# Patient Record
Sex: Female | Born: 1950
Health system: Southern US, Community
[De-identification: ages and names within clinical notes are randomized; demographics above are authoritative.]

## PROBLEM LIST (undated history)

## (undated) DIAGNOSIS — R112 Nausea with vomiting, unspecified: Secondary | ICD-10-CM

## (undated) DIAGNOSIS — E785 Hyperlipidemia, unspecified: Secondary | ICD-10-CM

## (undated) DIAGNOSIS — M8580 Other specified disorders of bone density and structure, unspecified site: Secondary | ICD-10-CM

## (undated) DIAGNOSIS — R7303 Prediabetes: Secondary | ICD-10-CM

## (undated) DIAGNOSIS — G43909 Migraine, unspecified, not intractable, without status migrainosus: Secondary | ICD-10-CM

## (undated) DIAGNOSIS — K760 Fatty (change of) liver, not elsewhere classified: Secondary | ICD-10-CM

## (undated) DIAGNOSIS — G8929 Other chronic pain: Secondary | ICD-10-CM

## (undated) DIAGNOSIS — N200 Calculus of kidney: Secondary | ICD-10-CM

## (undated) DIAGNOSIS — M545 Low back pain, unspecified: Secondary | ICD-10-CM

## (undated) DIAGNOSIS — T7840XA Allergy, unspecified, initial encounter: Secondary | ICD-10-CM

## (undated) DIAGNOSIS — Z9889 Other specified postprocedural states: Secondary | ICD-10-CM

## (undated) DIAGNOSIS — R42 Dizziness and giddiness: Secondary | ICD-10-CM

## (undated) DIAGNOSIS — E039 Hypothyroidism, unspecified: Secondary | ICD-10-CM

## (undated) DIAGNOSIS — M1990 Unspecified osteoarthritis, unspecified site: Secondary | ICD-10-CM

## (undated) DIAGNOSIS — B001 Herpesviral vesicular dermatitis: Secondary | ICD-10-CM

## (undated) DIAGNOSIS — J302 Other seasonal allergic rhinitis: Secondary | ICD-10-CM

## (undated) DIAGNOSIS — K219 Gastro-esophageal reflux disease without esophagitis: Secondary | ICD-10-CM

## (undated) DIAGNOSIS — J189 Pneumonia, unspecified organism: Secondary | ICD-10-CM

## (undated) HISTORY — PX: KNEE ARTHROSCOPY: SHX127

## (undated) HISTORY — DX: Calculus of kidney: N20.0

## (undated) HISTORY — PX: PATELLA HARDWARE REMOVAL: SHX2178

## (undated) HISTORY — PX: WRIST GANGLION EXCISION: SUR520

## (undated) HISTORY — PX: BACK SURGERY: SHX140

## (undated) HISTORY — PX: COLONOSCOPY: SHX174

## (undated) HISTORY — PX: SHOULDER SURGERY: SHX246

## (undated) HISTORY — PX: SHOULDER OPEN ROTATOR CUFF REPAIR: SHX2407

## (undated) HISTORY — PX: FIXATION KYPHOPLASTY LUMBAR SPINE: SHX1642

## (undated) HISTORY — PX: CATARACT EXTRACTION W/ INTRAOCULAR LENS  IMPLANT, BILATERAL: SHX1307

## (undated) HISTORY — PX: JOINT REPLACEMENT: SHX530

## (undated) HISTORY — PX: PATELLA FRACTURE SURGERY: SHX735

---

## 2001-06-03 ENCOUNTER — Encounter: Payer: Self-pay | Admitting: Orthopedic Surgery

## 2001-06-03 ENCOUNTER — Ambulatory Visit (HOSPITAL_COMMUNITY): Admission: RE | Admit: 2001-06-03 | Discharge: 2001-06-03 | Payer: Self-pay | Admitting: Orthopedic Surgery

## 2001-09-23 ENCOUNTER — Encounter: Payer: Self-pay | Admitting: Orthopedic Surgery

## 2001-09-23 ENCOUNTER — Ambulatory Visit (HOSPITAL_COMMUNITY): Admission: RE | Admit: 2001-09-23 | Discharge: 2001-09-23 | Payer: Self-pay | Admitting: Orthopedic Surgery

## 2002-03-03 ENCOUNTER — Ambulatory Visit (HOSPITAL_BASED_OUTPATIENT_CLINIC_OR_DEPARTMENT_OTHER): Admission: RE | Admit: 2002-03-03 | Discharge: 2002-03-03 | Payer: Self-pay | Admitting: Orthopedic Surgery

## 2003-09-19 ENCOUNTER — Other Ambulatory Visit: Admission: RE | Admit: 2003-09-19 | Discharge: 2003-09-19 | Payer: Self-pay | Admitting: Obstetrics and Gynecology

## 2009-07-29 HISTORY — PX: HIP ARTHROPLASTY: SHX981

## 2013-05-13 DIAGNOSIS — M5136 Other intervertebral disc degeneration, lumbar region: Secondary | ICD-10-CM | POA: Insufficient documentation

## 2013-05-13 DIAGNOSIS — M1990 Unspecified osteoarthritis, unspecified site: Secondary | ICD-10-CM | POA: Insufficient documentation

## 2014-01-20 ENCOUNTER — Other Ambulatory Visit (HOSPITAL_COMMUNITY): Payer: Self-pay | Admitting: Orthopaedic Surgery

## 2014-01-28 ENCOUNTER — Encounter (HOSPITAL_COMMUNITY): Payer: Self-pay

## 2014-02-02 ENCOUNTER — Encounter (HOSPITAL_COMMUNITY): Payer: Self-pay

## 2014-02-02 ENCOUNTER — Encounter (HOSPITAL_COMMUNITY)
Admission: RE | Admit: 2014-02-02 | Discharge: 2014-02-02 | Disposition: A | Discharge status: Home / Self Care | Payer: Federal, State, Local not specified - PPO | Source: Ambulatory Visit | Attending: Orthopaedic Surgery | Admitting: Orthopaedic Surgery

## 2014-02-02 ENCOUNTER — Encounter (HOSPITAL_COMMUNITY): Admission: RE | Admit: 2014-02-02 | Payer: Federal, State, Local not specified - PPO | Source: Ambulatory Visit

## 2014-02-02 DIAGNOSIS — Z01818 Encounter for other preprocedural examination: Secondary | ICD-10-CM | POA: Diagnosis not present

## 2014-02-02 DIAGNOSIS — Z01812 Encounter for preprocedural laboratory examination: Secondary | ICD-10-CM | POA: Insufficient documentation

## 2014-02-02 DIAGNOSIS — Z01810 Encounter for preprocedural cardiovascular examination: Secondary | ICD-10-CM | POA: Insufficient documentation

## 2014-02-02 HISTORY — DX: Other seasonal allergic rhinitis: J30.2

## 2014-02-02 HISTORY — DX: Nausea with vomiting, unspecified: R11.2

## 2014-02-02 HISTORY — DX: Herpesviral vesicular dermatitis: B00.1

## 2014-02-02 HISTORY — DX: Other specified postprocedural states: Z98.890

## 2014-02-02 HISTORY — DX: Pneumonia, unspecified organism: J18.9

## 2014-02-02 HISTORY — DX: Gastro-esophageal reflux disease without esophagitis: K21.9

## 2014-02-02 HISTORY — DX: Hyperlipidemia, unspecified: E78.5

## 2014-02-02 HISTORY — DX: Hypothyroidism, unspecified: E03.9

## 2014-02-02 HISTORY — DX: Unspecified osteoarthritis, unspecified site: M19.90

## 2014-02-02 LAB — COMPREHENSIVE METABOLIC PANEL
ALT: 93 U/L — ABNORMAL HIGH (ref 0–35)
AST: 95 U/L — ABNORMAL HIGH (ref 0–37)
Albumin: 3.9 g/dL (ref 3.5–5.2)
Alkaline Phosphatase: 117 U/L (ref 39–117)
Anion gap: 14 (ref 5–15)
BUN: 11 mg/dL (ref 6–23)
CHLORIDE: 102 meq/L (ref 96–112)
CO2: 24 meq/L (ref 19–32)
Calcium: 10 mg/dL (ref 8.4–10.5)
Creatinine, Ser: 0.71 mg/dL (ref 0.50–1.10)
GFR, EST NON AFRICAN AMERICAN: 89 mL/min — AB (ref >90)
GLUCOSE: 86 mg/dL (ref 70–99)
Potassium: 4.6 mEq/L (ref 3.7–5.3)
Sodium: 140 mEq/L (ref 137–147)
Total Bilirubin: 0.3 mg/dL (ref 0.3–1.2)
Total Protein: 7.4 g/dL (ref 6.0–8.3)

## 2014-02-02 LAB — URINE MICROSCOPIC-ADD ON

## 2014-02-02 LAB — CBC
HEMATOCRIT: 42.7 % (ref 36.0–46.0)
HEMOGLOBIN: 14.3 g/dL (ref 12.0–15.0)
MCH: 32.6 pg (ref 26.0–34.0)
MCHC: 33.5 g/dL (ref 30.0–36.0)
MCV: 97.5 fL (ref 78.0–100.0)
Platelets: 224 10*3/uL (ref 150–400)
RBC: 4.38 MIL/uL (ref 3.87–5.11)
RDW: 13.6 % (ref 11.5–15.5)
WBC: 7.1 10*3/uL (ref 4.0–10.5)

## 2014-02-02 LAB — URINALYSIS, ROUTINE W REFLEX MICROSCOPIC
BILIRUBIN URINE: NEGATIVE
Glucose, UA: NEGATIVE mg/dL
HGB URINE DIPSTICK: NEGATIVE
KETONES UR: NEGATIVE mg/dL
Nitrite: NEGATIVE
Protein, ur: NEGATIVE mg/dL
Specific Gravity, Urine: 1.016 (ref 1.005–1.030)
UROBILINOGEN UA: 0.2 mg/dL (ref 0.0–1.0)
pH: 5.5 (ref 5.0–8.0)

## 2014-02-02 LAB — PROTIME-INR
INR: 0.97 (ref 0.00–1.49)
PROTHROMBIN TIME: 12.9 s (ref 11.6–15.2)

## 2014-02-02 LAB — SURGICAL PCR SCREEN
MRSA, PCR: NEGATIVE
Staphylococcus aureus: POSITIVE — AB

## 2014-02-02 NOTE — Pre-Procedure Instructions (Signed)
Emma Lewis  02/02/2014   Your procedure is scheduled on:  Wednesday February 09, 2014 at 12:00 PM.  Report to [facility] Admitting at 10:00 AM.  Call this number if you have problems the morning of surgery: [phone]   Remember:   Do not eat food or drink liquids after midnight.   Take these medicines the morning of surgery with A SIP OF WATER: Acyclovir (Zovirax), Hydromorphone (Dilaudid) if needed, Levothyroxine (Synthroid), Loratadine (Claritin) if needed, and Omeprazole (Prilosec)   Do not wear jewelry, make-up or nail polish.  Do not wear lotions, powders, or perfumes.   Do not shave 48 hours prior to surgery.  Do not bring valuables to the hospital.  [facility] is not responsible for any belongings or valuables.               Contacts, dentures or bridgework may not be worn into surgery.  Leave suitcase in the car. After surgery it may be brought to your room.  For patients admitted to the hospital, discharge time is determined by your treatment team.               Patients discharged the day of surgery will not be allowed to drive home.  Name and phone number of your driver: Family/Friend  Special Instructions: Shower using CHG soap the night before and the morning of your surgery   Please read over the following fact sheets that you were given: Pain Booklet, Coughing and Deep Breathing, MRSA Information and Surgical Site Infection Prevention

## 2014-02-02 NOTE — Pre-Procedure Instructions (Signed)
SYRA SIRMONS  02/02/2014   Your procedure is scheduled on:  Wednesday February 09, 2014 at 12:00 PM.  Report to [facility] Admitting at 10:00 AM.  Call this number if you have problems the morning of surgery: [phone]   Remember:   Do not eat food or drink liquids after midnight.   Take these medicines the morning of surgery with A SIP OF WATER: Acyclovir (Zovirax), Hydromorphone (Dilaudid) if needed, Levothyroxine (Synthroid), Loratadine (Claritin) if needed, and Omeprazole (Prilosec)   Do not wear jewelry, make-up or nail polish.  Do not wear lotions, powders, or perfumes.   Do not shave 48 hours prior to surgery.  Do not bring valuables to the hospital.  [facility] is not responsible for any belongings or valuables.               Contacts, dentures or bridgework may not be worn into surgery.  Leave suitcase in the car. After surgery it may be brought to your room.  For patients admitted to the hospital, discharge time is determined by your treatment team.               Patients discharged the day of surgery will not be allowed to drive home.  Name and phone number of your driver: Family/Friend  Special Instructions: Shower using CHG soap the night before and the morning of your surgery   Please read over the following fact sheets that you were given: Pain Booklet, Coughing and Deep Breathing, MRSA Information and Surgical Site Infection Prevention

## 2014-02-02 NOTE — Progress Notes (Signed)
Nurse called Bactroban ointment in to Pharmacy in [facility]. Nurse then called patient. No answer. Voicemail left requesting that patient pick ointment up at earliest convenience. Direct call back number left.

## 2014-02-03 NOTE — Progress Notes (Addendum)
Anesthesia chart review:  Patient is a 63 year old female scheduled for C4-5, C5-6 ACDF on 02/09/14 by Dr. Lorin Mercy. History includes non-smoker, post-operative N/V, HLD, GERD, hypothyroidism, arthritis, left THA.  BMI is consistent with obesity. PCP is listed as Dr. Willey Blade.   Meds: Calcium with D, Evening Primrose Oil, Dilaudid, Foltanx, Magnesium, Fish Oil, Ester-C, Zovirax, ibuprofen, levothyroxine, loratadine, omeprazole, simvastatin.  EKG on 02/02/14 showed: NSR.  CXR on 02/02/14 showed: No acute disease.  Abdominal ultrasound on 08/16/13 ([facility]) showed:  1. Echogenic liver suggesting hepatic steatosis. 2. Prior granulomatous disease. 3. No gallstones or wall thickening visualized.  Preoperative labs noted.  AST/ALT 95/93. PLT count and PT/INR WNL. She reported rare ETOH use.  She is on statin therapy, acyclovir (HSV prophylaxis), and pain medication which could all be contributing to her elevated LFTs.  Hepatic steatosis on recent ultrasound could also be playing a part.  Currently, I have no comparison labs available.  I did call and speak with patient.  She says she has been told in the past that her LFTs have been a little elevated. She is taking ibuprofen QID with plans to hold starting tomorrow. She thinks Dr. Karlton Lemon should have comparison labs. Records requested.  If old labs received, and unchanged then we may not have to repeat labs, but for now, will plan to get a HFP and PTT on the day of surgery. If AST/ALT results are improved or stable then that would make an acute process less likely and would plan to proceed. If significantly further elevated that she may need additional evaluation preoperatively.  I discussed with anesthesiologist Dr. Tobias Alexander who agrees with this plan.  I also updated Malachy Mood at Dr. Lorin Mercy office.  She will have him review.  [facility] Short Stay Center/Anesthesiology Phone [phone] 02/03/2014 4:25 PM  Addendum: 02/04/2014 12:07  PM Reviewed records from Dr. Willey Blade from 10/13/13.  ALT was 66, AST 59 then.  Hepatitis Panel (A, B, C) was negative.  Of note, Dr. Roland Earl office will now be [facility] at [phone].  Patient will continue care with Dr. Karlton Lemon at her new location.

## 2014-02-08 MED ORDER — CEFAZOLIN SODIUM-DEXTROSE 2-3 GM-% IV SOLR
2.0000 g | INTRAVENOUS | Status: AC
  Administered 2014-02-09: 2 g via INTRAVENOUS
  Filled 2014-02-08: qty 50

## 2014-02-09 ENCOUNTER — Observation Stay (HOSPITAL_COMMUNITY): Payer: Medicare Other

## 2014-02-09 ENCOUNTER — Encounter (HOSPITAL_COMMUNITY)
Admission: RE | Disposition: A | Discharge status: Home / Self Care | Payer: Self-pay | Source: Ambulatory Visit | Attending: Orthopaedic Surgery

## 2014-02-09 ENCOUNTER — Observation Stay (HOSPITAL_COMMUNITY)
Admission: RE | Admit: 2014-02-09 | Discharge: 2014-02-10 | Disposition: A | Discharge status: Home / Self Care | Payer: Medicare Other | Source: Ambulatory Visit | Attending: Orthopaedic Surgery | Admitting: Orthopaedic Surgery

## 2014-02-09 ENCOUNTER — Inpatient Hospital Stay (HOSPITAL_COMMUNITY): Payer: Medicare Other | Admitting: Anesthesiology

## 2014-02-09 ENCOUNTER — Encounter (HOSPITAL_COMMUNITY): Payer: Self-pay | Admitting: *Deleted

## 2014-02-09 ENCOUNTER — Encounter (HOSPITAL_COMMUNITY): Payer: Medicare Other | Admitting: Vascular Surgery

## 2014-02-09 DIAGNOSIS — Z22321 Carrier or suspected carrier of Methicillin susceptible Staphylococcus aureus: Secondary | ICD-10-CM | POA: Diagnosis not present

## 2014-02-09 DIAGNOSIS — M5382 Other specified dorsopathies, cervical region: Secondary | ICD-10-CM | POA: Diagnosis not present

## 2014-02-09 DIAGNOSIS — K219 Gastro-esophageal reflux disease without esophagitis: Secondary | ICD-10-CM | POA: Insufficient documentation

## 2014-02-09 DIAGNOSIS — M47812 Spondylosis without myelopathy or radiculopathy, cervical region: Principal | ICD-10-CM | POA: Diagnosis present

## 2014-02-09 DIAGNOSIS — E039 Hypothyroidism, unspecified: Secondary | ICD-10-CM | POA: Diagnosis not present

## 2014-02-09 DIAGNOSIS — Z86718 Personal history of other venous thrombosis and embolism: Secondary | ICD-10-CM | POA: Diagnosis not present

## 2014-02-09 DIAGNOSIS — Z96649 Presence of unspecified artificial hip joint: Secondary | ICD-10-CM | POA: Insufficient documentation

## 2014-02-09 DIAGNOSIS — M1990 Unspecified osteoarthritis, unspecified site: Secondary | ICD-10-CM | POA: Insufficient documentation

## 2014-02-09 DIAGNOSIS — I2510 Atherosclerotic heart disease of native coronary artery without angina pectoris: Secondary | ICD-10-CM | POA: Diagnosis not present

## 2014-02-09 DIAGNOSIS — M5020 Other cervical disc displacement, unspecified cervical region: Secondary | ICD-10-CM | POA: Insufficient documentation

## 2014-02-09 HISTORY — PX: ANTERIOR CERVICAL DECOMP/DISCECTOMY FUSION: SHX1161

## 2014-02-09 LAB — APTT: aPTT: 23 seconds — ABNORMAL LOW (ref 24–37)

## 2014-02-09 LAB — HEPATIC FUNCTION PANEL
ALT: 97 U/L — ABNORMAL HIGH (ref 0–35)
AST: 97 U/L — ABNORMAL HIGH (ref 0–37)
Albumin: 3.8 g/dL (ref 3.5–5.2)
Alkaline Phosphatase: 122 U/L — ABNORMAL HIGH (ref 39–117)
Bilirubin, Direct: <0.2 mg/dL (ref 0.0–0.3)
TOTAL PROTEIN: 7.1 g/dL (ref 6.0–8.3)
Total Bilirubin: 0.4 mg/dL (ref 0.3–1.2)

## 2014-02-09 SURGERY — ANTERIOR CERVICAL DECOMPRESSION/DISCECTOMY FUSION 2 LEVELS
Anesthesia: General | Site: Neck

## 2014-02-09 MED ORDER — NEOSTIGMINE METHYLSULFATE 10 MG/10ML IV SOLN
INTRAVENOUS | Status: DC | PRN
  Administered 2014-02-09: 3 mg via INTRAVENOUS

## 2014-02-09 MED ORDER — ACETAMINOPHEN 160 MG/5ML PO SOLN
325.0000 mg | ORAL | Status: DC | PRN
  Filled 2014-02-09: qty 20.3

## 2014-02-09 MED ORDER — FENTANYL CITRATE 0.05 MG/ML IJ SOLN
INTRAMUSCULAR | Status: AC
  Filled 2014-02-09: qty 5

## 2014-02-09 MED ORDER — HYDROMORPHONE HCL PF 1 MG/ML IJ SOLN
INTRAMUSCULAR | Status: AC
  Filled 2014-02-09: qty 1

## 2014-02-09 MED ORDER — MORPHINE SULFATE 2 MG/ML IJ SOLN: 1.0000 mg | INTRAMUSCULAR | Status: DC | PRN

## 2014-02-09 MED ORDER — METHOCARBAMOL 500 MG PO TABS
500.0000 mg | ORAL_TABLET | Freq: Four times a day (QID) | ORAL | Status: DC | PRN
  Administered 2014-02-09: 500 mg via ORAL

## 2014-02-09 MED ORDER — OXYCODONE-ACETAMINOPHEN 5-325 MG PO TABS
1.0000 | ORAL_TABLET | ORAL | Status: DC | PRN
  Administered 2014-02-09 – 2014-02-10 (×2): 2 via ORAL
  Filled 2014-02-09 (×2): qty 2

## 2014-02-09 MED ORDER — PHENYLEPHRINE HCL 10 MG/ML IJ SOLN
INTRAMUSCULAR | Status: DC | PRN
  Administered 2014-02-09 (×3): 40 ug via INTRAVENOUS

## 2014-02-09 MED ORDER — OXYCODONE HCL 5 MG/5ML PO SOLN
5.0000 mg | Freq: Once | ORAL | Status: DC | PRN
Start: 2014-02-09 — End: 2014-02-09

## 2014-02-09 MED ORDER — ACETAMINOPHEN 325 MG PO TABS: 650.0000 mg | ORAL_TABLET | ORAL | Status: DC | PRN

## 2014-02-09 MED ORDER — POLYVINYL ALCOHOL 1.4 % OP SOLN
1.0000 [drp] | Freq: Every day | OPHTHALMIC | Status: DC | PRN
  Filled 2014-02-09: qty 15

## 2014-02-09 MED ORDER — GLYCOPYRROLATE 0.2 MG/ML IJ SOLN
INTRAMUSCULAR | Status: DC | PRN
  Administered 2014-02-09: 0.4 mg via INTRAVENOUS

## 2014-02-09 MED ORDER — PHENYLEPHRINE 40 MCG/ML (10ML) SYRINGE FOR IV PUSH (FOR BLOOD PRESSURE SUPPORT)
PREFILLED_SYRINGE | INTRAVENOUS | Status: AC
  Filled 2014-02-09: qty 10

## 2014-02-09 MED ORDER — ARTIFICIAL TEARS OP OINT
TOPICAL_OINTMENT | OPHTHALMIC | Status: DC | PRN
  Administered 2014-02-09: 1 via OPHTHALMIC

## 2014-02-09 MED ORDER — BUPIVACAINE-EPINEPHRINE (PF) 0.25% -1:200000 IJ SOLN
INTRAMUSCULAR | Status: AC
  Filled 2014-02-09: qty 30

## 2014-02-09 MED ORDER — LEVOTHYROXINE SODIUM 100 MCG PO TABS
100.0000 ug | ORAL_TABLET | Freq: Every day | ORAL | Status: DC
  Administered 2014-02-10: 100 ug via ORAL
  Filled 2014-02-09 (×2): qty 1

## 2014-02-09 MED ORDER — ACYCLOVIR 400 MG PO TABS
400.0000 mg | ORAL_TABLET | Freq: Every day | ORAL | Status: DC
  Filled 2014-02-09: qty 1

## 2014-02-09 MED ORDER — VECURONIUM BROMIDE 10 MG IV SOLR
INTRAVENOUS | Status: DC | PRN
  Administered 2014-02-09: 9 mg via INTRAVENOUS
  Administered 2014-02-09: 1 mg via INTRAVENOUS

## 2014-02-09 MED ORDER — FENTANYL CITRATE 0.05 MG/ML IJ SOLN
INTRAMUSCULAR | Status: DC | PRN
  Administered 2014-02-09 (×3): 50 ug via INTRAVENOUS
  Administered 2014-02-09: 100 ug via INTRAVENOUS
  Administered 2014-02-09: 50 ug via INTRAVENOUS

## 2014-02-09 MED ORDER — OXYCODONE HCL 5 MG PO TABS: 5.0000 mg | ORAL_TABLET | Freq: Once | ORAL | Status: DC | PRN

## 2014-02-09 MED ORDER — SOOTHE XP OP SOLN: 1.0000 [drp] | Freq: Every day | OPHTHALMIC | Status: DC | PRN

## 2014-02-09 MED ORDER — SODIUM CHLORIDE 0.9 % IJ SOLN: 3.0000 mL | INTRAMUSCULAR | Status: DC | PRN

## 2014-02-09 MED ORDER — FLEET ENEMA 7-19 GM/118ML RE ENEM
1.0000 | ENEMA | Freq: Once | RECTAL | Status: AC | PRN
  Filled 2014-02-09: qty 1

## 2014-02-09 MED ORDER — LACTATED RINGERS IV SOLN
INTRAVENOUS | Status: DC | PRN
Start: 2014-02-09 — End: 2014-02-09
  Administered 2014-02-09 (×2): via INTRAVENOUS

## 2014-02-09 MED ORDER — METHOCARBAMOL 500 MG PO TABS: 500.0000 mg | ORAL_TABLET | Freq: Four times a day (QID) | ORAL | Status: DC | PRN

## 2014-02-09 MED ORDER — KETOROLAC TROMETHAMINE 30 MG/ML IJ SOLN: 15.0000 mg | Freq: Once | INTRAMUSCULAR | Status: DC | PRN

## 2014-02-09 MED ORDER — SODIUM CHLORIDE 0.9 % IJ SOLN
3.0000 mL | Freq: Two times a day (BID) | INTRAMUSCULAR | Status: DC
  Administered 2014-02-09: 3 mL via INTRAVENOUS

## 2014-02-09 MED ORDER — ONDANSETRON HCL 4 MG/2ML IJ SOLN: 4.0000 mg | INTRAMUSCULAR | Status: DC | PRN

## 2014-02-09 MED ORDER — VECURONIUM BROMIDE 10 MG IV SOLR
INTRAVENOUS | Status: AC
  Filled 2014-02-09: qty 10

## 2014-02-09 MED ORDER — LIDOCAINE HCL (CARDIAC) 20 MG/ML IV SOLN
INTRAVENOUS | Status: DC | PRN
  Administered 2014-02-09: 70 mg via INTRAVENOUS

## 2014-02-09 MED ORDER — DOCUSATE SODIUM 100 MG PO CAPS
100.0000 mg | ORAL_CAPSULE | Freq: Two times a day (BID) | ORAL | Status: DC
  Administered 2014-02-09 – 2014-02-10 (×2): 100 mg via ORAL
  Filled 2014-02-09 (×3): qty 1

## 2014-02-09 MED ORDER — ZOLPIDEM TARTRATE 5 MG PO TABS: 5.0000 mg | ORAL_TABLET | Freq: Every evening | ORAL | Status: DC | PRN

## 2014-02-09 MED ORDER — HYDROCODONE-ACETAMINOPHEN 5-325 MG PO TABS: 1.0000 | ORAL_TABLET | ORAL | Status: DC | PRN

## 2014-02-09 MED ORDER — OXYCODONE-ACETAMINOPHEN 5-325 MG PO TABS: 1.0000 | ORAL_TABLET | ORAL | Status: DC | PRN

## 2014-02-09 MED ORDER — LORATADINE 10 MG PO TABS
10.0000 mg | ORAL_TABLET | Freq: Every day | ORAL | Status: DC | PRN
  Filled 2014-02-09: qty 1

## 2014-02-09 MED ORDER — BUPIVACAINE-EPINEPHRINE 0.25% -1:200000 IJ SOLN
INTRAMUSCULAR | Status: DC | PRN
  Administered 2014-02-09: 7 mL

## 2014-02-09 MED ORDER — DEXTROSE 5 % IV SOLN
500.0000 mg | Freq: Four times a day (QID) | INTRAVENOUS | Status: DC | PRN
  Filled 2014-02-09: qty 5

## 2014-02-09 MED ORDER — KETOROLAC TROMETHAMINE 30 MG/ML IJ SOLN
30.0000 mg | Freq: Four times a day (QID) | INTRAMUSCULAR | Status: DC
  Administered 2014-02-09 – 2014-02-10 (×3): 30 mg via INTRAVENOUS
  Filled 2014-02-09 (×3): qty 1

## 2014-02-09 MED ORDER — ACETAMINOPHEN 325 MG PO TABS: 325.0000 mg | ORAL_TABLET | ORAL | Status: DC | PRN

## 2014-02-09 MED ORDER — ONDANSETRON HCL 4 MG/2ML IJ SOLN: 4.0000 mg | Freq: Once | INTRAMUSCULAR | Status: DC | PRN

## 2014-02-09 MED ORDER — SCOPOLAMINE 1 MG/3DAYS TD PT72
1.0000 | MEDICATED_PATCH | TRANSDERMAL | Status: DC
  Administered 2014-02-09: 1.5 mg via TRANSDERMAL

## 2014-02-09 MED ORDER — PANTOPRAZOLE SODIUM 40 MG PO TBEC
40.0000 mg | DELAYED_RELEASE_TABLET | Freq: Every day | ORAL | Status: DC
  Administered 2014-02-10: 40 mg via ORAL
  Filled 2014-02-09: qty 1

## 2014-02-09 MED ORDER — MIDAZOLAM HCL 2 MG/2ML IJ SOLN
INTRAMUSCULAR | Status: AC
  Filled 2014-02-09: qty 2

## 2014-02-09 MED ORDER — METHOCARBAMOL 500 MG PO TABS
ORAL_TABLET | ORAL | Status: AC
  Filled 2014-02-09: qty 1

## 2014-02-09 MED ORDER — ACETAMINOPHEN 650 MG RE SUPP
650.0000 mg | RECTAL | Status: DC | PRN
Start: 2014-02-09 — End: 2014-02-10

## 2014-02-09 MED ORDER — ONDANSETRON HCL 4 MG/2ML IJ SOLN
INTRAMUSCULAR | Status: DC | PRN
  Administered 2014-02-09: 4 mg via INTRAVENOUS

## 2014-02-09 MED ORDER — STERILE WATER FOR INJECTION IJ SOLN
INTRAMUSCULAR | Status: AC
  Filled 2014-02-09: qty 10

## 2014-02-09 MED ORDER — DEXAMETHASONE SODIUM PHOSPHATE 4 MG/ML IJ SOLN
INTRAMUSCULAR | Status: DC | PRN
  Administered 2014-02-09: 4 mg via INTRAVENOUS

## 2014-02-09 MED ORDER — GLYCOPYRROLATE 0.2 MG/ML IJ SOLN
INTRAMUSCULAR | Status: AC
  Filled 2014-02-09: qty 2

## 2014-02-09 MED ORDER — SCOPOLAMINE 1 MG/3DAYS TD PT72
MEDICATED_PATCH | TRANSDERMAL | Status: AC
  Filled 2014-02-09: qty 1

## 2014-02-09 MED ORDER — KCL IN DEXTROSE-NACL 20-5-0.45 MEQ/L-%-% IV SOLN
INTRAVENOUS | Status: DC
  Filled 2014-02-09 (×3): qty 1000

## 2014-02-09 MED ORDER — SIMVASTATIN 40 MG PO TABS
40.0000 mg | ORAL_TABLET | Freq: Every day | ORAL | Status: DC
  Administered 2014-02-09: 40 mg via ORAL
  Filled 2014-02-09 (×2): qty 1

## 2014-02-09 MED ORDER — BISACODYL 10 MG RE SUPP: 10.0000 mg | Freq: Every day | RECTAL | Status: DC | PRN

## 2014-02-09 MED ORDER — MIDAZOLAM HCL 5 MG/5ML IJ SOLN
INTRAMUSCULAR | Status: DC | PRN
  Administered 2014-02-09: 2 mg via INTRAVENOUS

## 2014-02-09 MED ORDER — PROPOFOL 10 MG/ML IV BOLUS
INTRAVENOUS | Status: DC | PRN
  Administered 2014-02-09: 140 mg via INTRAVENOUS
  Administered 2014-02-09: 60 mg via INTRAVENOUS

## 2014-02-09 MED ORDER — KETOROLAC TROMETHAMINE 30 MG/ML IJ SOLN
INTRAMUSCULAR | Status: AC
  Filled 2014-02-09: qty 1

## 2014-02-09 MED ORDER — PHENOL 1.4 % MT LIQD: 1.0000 | OROMUCOSAL | Status: DC | PRN

## 2014-02-09 MED ORDER — SENNOSIDES-DOCUSATE SODIUM 8.6-50 MG PO TABS
1.0000 | ORAL_TABLET | Freq: Every evening | ORAL | Status: DC | PRN
  Filled 2014-02-09: qty 1

## 2014-02-09 MED ORDER — DEXAMETHASONE SODIUM PHOSPHATE 4 MG/ML IJ SOLN
INTRAMUSCULAR | Status: AC
Start: 2014-02-09 — End: 2014-02-09
  Filled 2014-02-09: qty 1

## 2014-02-09 MED ORDER — NEOSTIGMINE METHYLSULFATE 10 MG/10ML IV SOLN
INTRAVENOUS | Status: AC
  Filled 2014-02-09: qty 1

## 2014-02-09 MED ORDER — LACTATED RINGERS IV SOLN
INTRAVENOUS | Status: DC
  Administered 2014-02-09: 11:00:00 via INTRAVENOUS

## 2014-02-09 MED ORDER — MENTHOL 3 MG MT LOZG
1.0000 | LOZENGE | OROMUCOSAL | Status: DC | PRN
  Filled 2014-02-09: qty 9

## 2014-02-09 MED ORDER — HYDROMORPHONE HCL PF 1 MG/ML IJ SOLN: 0.2500 mg | INTRAMUSCULAR | Status: DC | PRN

## 2014-02-09 MED ORDER — CEFAZOLIN SODIUM 1-5 GM-% IV SOLN
1.0000 g | Freq: Three times a day (TID) | INTRAVENOUS | Status: AC
  Administered 2014-02-09 – 2014-02-10 (×2): 1 g via INTRAVENOUS
  Filled 2014-02-09 (×2): qty 50

## 2014-02-09 SURGICAL SUPPLY — 64 items
ADH SKN CLS APL DERMABOND .7 (GAUZE/BANDAGES/DRESSINGS) ×1
APL SKNCLS STERI-STRIP NONHPOA (GAUZE/BANDAGES/DRESSINGS) ×1
BENZOIN TINCTURE PRP APPL 2/3 (GAUZE/BANDAGES/DRESSINGS) ×2 IMPLANT
BIT DRILL SKYLINE 12MM (BIT) IMPLANT
BLADE SURG ROTATE 9660 (MISCELLANEOUS) IMPLANT
BONE CERV LORDOTIC 14.5X12X6 (Bone Implant) ×6 IMPLANT
BUR ROUND FLUTED 4 SOFT TCH (BURR) ×1 IMPLANT
BUR ROUND FLUTED 4MM SOFT TCH (BURR) ×1
CLOSURE STERI-STRIP 1/2X4 (GAUZE/BANDAGES/DRESSINGS) ×1
CLSR STERI-STRIP ANTIMIC 1/2X4 (GAUZE/BANDAGES/DRESSINGS) ×1 IMPLANT
COLLAR CERV LO CONTOUR FIRM DE (SOFTGOODS) ×3 IMPLANT
CORDS BIPOLAR (ELECTRODE) IMPLANT
COVER SURGICAL LIGHT HANDLE (MISCELLANEOUS) ×3 IMPLANT
CRADLE DONUT ADULT HEAD (MISCELLANEOUS) ×3 IMPLANT
DERMABOND ADVANCED (GAUZE/BANDAGES/DRESSINGS) ×2
DERMABOND ADVANCED .7 DNX12 (GAUZE/BANDAGES/DRESSINGS) ×1 IMPLANT
DRAPE C-ARM 42X72 X-RAY (DRAPES) ×3 IMPLANT
DRAPE MICROSCOPE LEICA (MISCELLANEOUS) ×3 IMPLANT
DRAPE PROXIMA HALF (DRAPES) ×3 IMPLANT
DRILL BIT SKYLINE 12MM (BIT) ×3
DRSG MEPILEX BORDER 4X4 (GAUZE/BANDAGES/DRESSINGS) ×3 IMPLANT
DRSG MEPILEX BORDER 4X8 (GAUZE/BANDAGES/DRESSINGS) ×3 IMPLANT
DURAPREP 6ML APPLICATOR 50/CS (WOUND CARE) ×3 IMPLANT
ELECT COATED BLADE 2.86 ST (ELECTRODE) ×3 IMPLANT
ELECT REM PT RETURN 9FT ADLT (ELECTROSURGICAL) ×3
ELECTRODE REM PT RTRN 9FT ADLT (ELECTROSURGICAL) ×1 IMPLANT
EVACUATOR 1/8 PVC DRAIN (DRAIN) ×3 IMPLANT
GAUZE XEROFORM 1X8 LF (GAUZE/BANDAGES/DRESSINGS) ×3 IMPLANT
GLOVE BIOGEL PI IND STRL 7.5 (GLOVE) ×1 IMPLANT
GLOVE BIOGEL PI IND STRL 8 (GLOVE) ×1 IMPLANT
GLOVE BIOGEL PI INDICATOR 7.5 (GLOVE) ×2
GLOVE BIOGEL PI INDICATOR 8 (GLOVE) ×2
GLOVE ECLIPSE 7.0 STRL STRAW (GLOVE) ×3 IMPLANT
GLOVE ORTHO TXT STRL SZ7.5 (GLOVE) ×3 IMPLANT
GOWN STRL REUS W/ TWL LRG LVL3 (GOWN DISPOSABLE) ×2 IMPLANT
GOWN STRL REUS W/ TWL XL LVL3 (GOWN DISPOSABLE) ×1 IMPLANT
GOWN STRL REUS W/TWL LRG LVL3 (GOWN DISPOSABLE) ×6
GOWN STRL REUS W/TWL XL LVL3 (GOWN DISPOSABLE) ×3
GRAFT BNE SPCR VG2 14.5X12X6 (Bone Implant) IMPLANT
HEAD HALTER (SOFTGOODS) ×3 IMPLANT
HEMOSTAT SURGICEL 2X14 (HEMOSTASIS) IMPLANT
KIT BASIN OR (CUSTOM PROCEDURE TRAY) ×3 IMPLANT
KIT ROOM TURNOVER OR (KITS) ×3 IMPLANT
MANIFOLD NEPTUNE II (INSTRUMENTS) ×3 IMPLANT
NDL 25GX 5/8IN NON SAFETY (NEEDLE) ×1 IMPLANT
NEEDLE 25GX 5/8IN NON SAFETY (NEEDLE) ×3 IMPLANT
NS IRRIG 1000ML POUR BTL (IV SOLUTION) ×3 IMPLANT
PACK ORTHO CERVICAL (CUSTOM PROCEDURE TRAY) ×3 IMPLANT
PAD ARMBOARD 7.5X6 YLW CONV (MISCELLANEOUS) ×6 IMPLANT
PATTIES SURGICAL .5 X.5 (GAUZE/BANDAGES/DRESSINGS) IMPLANT
PIN TEMP SKYLINE THREADED (PIN) ×2 IMPLANT
PLATE SKYLINE 2LVL 26MM (Plate) ×2 IMPLANT
SCREW VARIABLE SELF TAP 12MM (Screw) ×12 IMPLANT
SPONGE GAUZE 4X4 12PLY (GAUZE/BANDAGES/DRESSINGS) ×3 IMPLANT
SPONGE GAUZE 4X4 12PLY STER LF (GAUZE/BANDAGES/DRESSINGS) ×2 IMPLANT
SURGIFLO TRUKIT (HEMOSTASIS) IMPLANT
SUT BONE WAX W31G (SUTURE) ×3 IMPLANT
SUT VIC AB 3-0 X1 27 (SUTURE) ×3 IMPLANT
SUT VICRYL 4-0 PS2 18IN ABS (SUTURE) ×6 IMPLANT
SYR 30ML SLIP (SYRINGE) ×3 IMPLANT
TAPE CLOTH SURG 4X10 WHT LF (GAUZE/BANDAGES/DRESSINGS) ×2 IMPLANT
TOWEL OR 17X24 6PK STRL BLUE (TOWEL DISPOSABLE) ×3 IMPLANT
TOWEL OR 17X26 10 PK STRL BLUE (TOWEL DISPOSABLE) ×3 IMPLANT
WATER STERILE IRR 1000ML POUR (IV SOLUTION) ×3 IMPLANT

## 2014-02-09 NOTE — Interval H&P Note (Signed)
History and Physical Interval Note:  02/09/2014 12:03 PM  [name]  has presented today for surgery, with the diagnosis of C4-5, C5-6  Spondylosis  The various methods of treatment have been discussed with the patient and family. After consideration of risks, benefits and other options for treatment, the patient has consented to  Procedure(s) with comments: ANTERIOR CERVICAL DECOMPRESSION/DISCECTOMY FUSION 2 LEVELS (N/A) - C4-5, C5-6 Anterior Cervical Discectomy and Fusion, Allograft, Plate as a surgical intervention .  The patient's history has been reviewed, patient examined, no change in status, stable for surgery.  I have reviewed the patient's chart and labs.  Questions were answered to the patient's satisfaction.     [name] C

## 2014-02-09 NOTE — Brief Op Note (Signed)
02/09/2014  2:37 PM  PATIENT:  Emma Lewis  63 y.o. female  PRE-OPERATIVE DIAGNOSIS:  C4-5, C5-6  Spondylosis  POST-OPERATIVE DIAGNOSIS:  C4-5, C5-6 Spondylosis  PROCEDURE:  Procedure(s) with comments: ANTERIOR CERVICAL DECOMPRESSION/DISCECTOMY FUSION 2 LEVELS (N/A) - C4-5, C5-6 Anterior Cervical Discectomy and Fusion, Allograft, Plate  SURGEON:  Surgeon(s) and Role:    * [name], MD - Primary  PHYSICIAN ASSISTANT: Phillips Hay [facility]  ASSISTANTS: none   ANESTHESIA:   general  EBL:  Total I/O In: 750 [I.V.:750] Out: -   BLOOD ADMINISTERED:none  DRAINS: (1) Hemovact drain(s) in the anterior neck  with  Suction Open   LOCAL MEDICATIONS USED:  NONE  SPECIMEN:  No Specimen  DISPOSITION OF SPECIMEN:  N/A  COUNTS:  YES  TOURNIQUET:  * No tourniquets in log *  DICTATION: .Note written in EPIC  PLAN OF CARE: Admit for overnight observation  PATIENT DISPOSITION:  PACU - hemodynamically stable.   Delay start of Pharmacological VTE agent (>24hrs) due to surgical blood loss or risk of bleeding: yes

## 2014-02-09 NOTE — Anesthesia Procedure Notes (Signed)
Procedure Name: Intubation Date/Time: 02/09/2014 12:31 PM Performed by: Jenne Campus Pre-anesthesia Checklist: Patient identified, Emergency Drugs available, Suction available, Patient being monitored and Timeout performed Patient Re-evaluated:Patient Re-evaluated prior to inductionOxygen Delivery Method: Circle system utilized Preoxygenation: Pre-oxygenation with 100% oxygen Intubation Type: IV induction Ventilation: Mask ventilation without difficulty Laryngoscope Size: Miller and 2 Grade View: Grade III Tube type: Oral Tube size: 7.0 mm Number of attempts: 1 Airway Equipment and Method: Bougie stylet Placement Confirmation: positive ETCO2,  CO2 detector and breath sounds checked- equal and bilateral Secured at: 22 cm Tube secured with: Tape Dental Injury: Teeth and Oropharynx as per pre-operative assessment  Future Recommendations: Recommend- induction with short-acting agent, and alternative techniques readily available Comments: Smooth IV induction. EZ mask. DL x 1 Mil 2. Grade 3 view. Unable to pass ETT. Bougie stylet utlized. AOL. +ETCO2. BBSE.

## 2014-02-09 NOTE — Anesthesia Preprocedure Evaluation (Addendum)
Anesthesia Evaluation  Patient identified by MRN, date of birth, ID band Patient awake    Reviewed: Allergy & Precautions, H&P , NPO status , Patient's Chart, lab work & pertinent test results  History of Anesthesia Complications (+) PONV and history of anesthetic complications  Airway Mallampati: III TM Distance: >3 FB Neck ROM: Full    Dental  (+) Teeth Intact   Pulmonary neg shortness of breath, neg sleep apnea, neg COPDneg recent URI,  breath sounds clear to auscultation        Cardiovascular - angina- CAD, - Past MI and - CHF negative cardio ROS  Rhythm:Regular     Neuro/Psych Neck pain with right arm radiculopathy  Neuromuscular disease negative psych ROS   GI/Hepatic Neg liver ROS, GERD-  Medicated and Controlled,  Endo/Other  Hypothyroidism   Renal/GU negative Renal ROS     Musculoskeletal   Abdominal   Peds  Hematology negative hematology ROS (+)   Anesthesia Other Findings   Reproductive/Obstetrics                          Anesthesia Physical Anesthesia Plan  ASA: II  Anesthesia Plan: General   Post-op Pain Management:    Induction: Intravenous  Airway Management Planned: Oral ETT  Additional Equipment: None  Intra-op Plan:   Post-operative Plan: Extubation in OR  Informed Consent: I have reviewed the patients History and Physical, chart, labs and discussed the procedure including the risks, benefits and alternatives for the proposed anesthesia with the patient or authorized representative who has indicated his/her understanding and acceptance.   Dental advisory given  Plan Discussed with: CRNA and Surgeon  Anesthesia Plan Comments:         Anesthesia Quick Evaluation

## 2014-02-09 NOTE — H&P (Signed)
[facility]   A Division of [employer] [facility]   [address], [place], [place] [address] Telephone: [phone]  Fax: [phone]     PATIENT: Emma Lewis, Emma Lewis   MR#: [id number]  DOB: 03-26-1951      CHIEF COMPLAINT:  Neck and bilateral upper extremity pain,numbness and tingling. HPI:The patient returns with ongoing significant pain in her neck, pain that radiates into her shoulders.  Had problems with numbness and tingling in her hands and arms.  The patient previously got a few days relief with a prednisone pack in the past.  Due to persistent symptoms, MRI scan was obtained with cervical spondylitic changes on plain radiographs.  This shows multilevel changes in the cervical spine, which we reviewed on the MRI from 12/08/13 with her.  She has a 9 mm canal at C4-5 with central stenosis.  Moderate foraminal narrowing bilaterally.  Spondylosis with posterior uncinate spurring.  AP diameter 9 mm at C5-6 with narrowing but no cord deformity.  Moderate foraminal narrowing.  C6-7 shows mildly narrowing and only mild foraminal stenosis on the left.  The patient has had several years history of neck pain.  It has increased.  She has been on chronic medication in the past for it, including Dilaudid and also Percocet.  She uses night splints for treatment of her carpal tunnel in the past.   CURRENT MEDICATIONS:  Her medications are reviewed, including Synthroid, simvastatin, acyclovir, Foltanx, Ester-C, calcium, fish oil and magnesium over the counter.  She has been on Percocet 10 for the pain in the past.  Prednisone pack was in March with only temporary relief.     PAST MEDICAL/SURGICAL HISTORY:  She has had previous left total hip arthroplasty doing well, done in [place], knee arthroscopy, previous patellar fracture and 3 shoulder surgeries.     SOCIAL HISTORY:  She is married to her husband, Obie Dredge.  She is retired.  She does not smoke or drink.  Does not use drugs.      REVIEW OF SYSTEMS:  Positive for bronchitis, past history of DVT, pneumonia, arthritis, acid reflux and thyroid condition.     PHYSICAL EXAMINATION:  The patient is alert, 5 feet 5-1/2 inches, 200 pounds.  Well developed, well nourished.  She has bilateral brachial plexus tenderness.  Positive Spurling both right and left.  Reflexes are 2+.  Ulnar nerve at the elbow, median nerve in the forearm and wrist are normal.  She has some crepitus with knee range of motion.  EHL, anterior tib are intact.  No lower extremity hyperreflexia.        RADIOGRAPHS:  Plain radiographs show retrolisthesis, C5-6.  Slight listhesis at 4-5 with spurring.    ASSESSMENT:  We discussed with the patient she has multilevel disease, most significant is at the C4-5, C5-6 levels.     PLAN:  Options were discussed.  She states that she is tired of having the pain.  She does not like to take narcotic medication, although she has had narcotic medicine in the past, which was necessary to control the pain.  Operative technique discussed.  The plan would be 2-level cervical fusion, C4-5, C5-6, overnight stay.  She understands she would  have to wear a collar.  She states she is interested in going back to work as quick as possible, and as long as she could  have a driver, she could go to work as soon as she was off pain medication after surgery, which is usually in the  1-2 week range.  She understands she would have to wear a collar for 6 weeks.  She has had some problems with claustrophobia and does not like to even wear a turtleneck.  We discussed possibility of an Aspen collar, if the soft cervical collar is not tolerated, but she would do better and have a higher fusion rate if she was immobilized postoperatively.  Procedure discussed with the patient.  She has had previous electrical tests 5 years ago, which showed no evidence of carpal tunnel syndrome.  Risks of dysphagia, dysphonia, pseudoarthrosis, reoperation with posterior  fusion were discussed.  Allograft, plate fixation, risk of infection.  All questions answered.  The plan would be 1 aspirin a day postoperatively to help with DVT risk as she would not be able to take Xarelto or Coumadin postoperatively after spine surgery for a week or 2, and would be immediately ambulatory after the surgery.  SCDs could be used and then TEDs after she was discharged.  All questions answered.  She understands and will call about proceeding for scheduling.      For additional information please see handwritten notes, reports, orders and prescriptions in this chart.      Mark C. Lorin Mercy, M.D.    Auto-Authenticated by Thana Farr. Lorin Mercy, M.D.  MCY/sw DD: 12/29/2013  DT: 12/30/2013   cc: Willey Blade, M.D.([employer]mdat [employer])

## 2014-02-09 NOTE — Transfer of Care (Signed)
Immediate Anesthesia Transfer of Care Note  Patient: Emma Lewis  Procedure(s) Performed: Procedure(s) with comments: ANTERIOR CERVICAL DECOMPRESSION/DISCECTOMY FUSION 2 LEVELS (N/A) - C4-5, C5-6 Anterior Cervical Discectomy and Fusion, Allograft, Plate  Patient Location: PACU  Anesthesia Type:General  Level of Consciousness: awake, alert , oriented and patient cooperative  Airway & Oxygen Therapy: Patient Spontanous Breathing and Patient connected to nasal cannula oxygen  Post-op Assessment: Report given to PACU RN and Post -op Vital signs reviewed and stable  Post vital signs: Reviewed  Complications: No apparent anesthesia complications

## 2014-02-09 NOTE — Plan of Care (Signed)
Problem: Consults Goal: Diagnosis - Spinal Surgery Outcome: Completed/Met Date Met:  02/09/14 Cervical Spine Fusion

## 2014-02-09 NOTE — Discharge Instructions (Signed)
No lifting greater than 10 lbs. No overhead use of arms. Avoid bending,and twisting neck. Walk in house for first week them may start to get out slowly increasing distance up to one mile by 3 weeks post op. Keep incision dry for 3 days, may then bathe and wet incision using a covered collar when showering. Call if any fevers >101, chills, or increasing numbness or weakness or increased swelling or drainage. Wear collar at all times.  Ice packs to incision as needed for pain and swelling.

## 2014-02-10 DIAGNOSIS — M47812 Spondylosis without myelopathy or radiculopathy, cervical region: Secondary | ICD-10-CM | POA: Diagnosis not present

## 2014-02-10 NOTE — Progress Notes (Signed)
Utilization review completed.  

## 2014-02-10 NOTE — Op Note (Signed)
NAMESHAMERA, YARBERRY NO.:  [id number]  MEDICAL RECORD NO.:  [id number]  LOCATION:  3C05C                        FACILITY:  [facility]  PHYSICIAN:  Keymoni Mccaster C. Lorin Mercy, M.D.    DATE OF BIRTH:  August 12, 1950  DATE OF PROCEDURE:  02/09/2014 DATE OF DISCHARGE:                              OPERATIVE REPORT   PREOPERATIVE DIAGNOSES:  Cervical spondylosis, C4-5, C5-6 with herniated nucleus pulposus and radiculopathy.  POSTOPERATIVE DIAGNOSES:  Cervical spondylosis, C4-5, C5-6 with herniated nucleus pulposus and radiculopathy.  PROCEDURE:  C4-5, C5-6 anterior cervical diskectomy and fusion, allograft, and plate.  SURGEON:  Conley Delisle C. Lorin Mercy, M.D.  ASSISTANT:  Phillips Hay, PA-C, medically necessary and present for the entire procedure.  ESTIMATED BLOOD LOSS:  Less than 50 mL.  DRAINS:  1 Hemovac.  COMPLICATIONS:  None.  HARDWARE:  DePuy Skyline plate with 97-QB screws x6.  DESCRIPTION OF PROCEDURE:  After induction of general anesthesia, orotracheal intubation, preoperative antibiotics, the patient had positive MSSA culture.  Ancef was given prophylactically.  Neck was prepped with DuraPrep after head halter traction had been applied, but no weight.  Area was squared with towels, Betadine and Steri-Drape applied.  Arms have been tucked at the side with yellow pads over the ulnar nerve.  Sterile Mayo stand at the head and then thyroid sheet draped.  Time-out procedure was completed.  Incision was made in the midline extending to the left with gentle S-shaped curve, which followed natural skin line fold.  Platysma was divided in line with the fibers. Blunt dissection down the level of longus colli.  There were extremely large overhanging spurs 1 cm plus, and initially it was hard to even find the disk space level.  Going more cephalad, the 3-4 level was identified.  Short 25 needle placed with a straight clamp identified with the C-arm that had been appropriately draped,  brought in for visualization with lateral fluoroscopic view.  Next, level down 4-5 was identified, motion was noted, there was hypermobility, but overhanging spurs were present, and overhanging spurs were debrided and then the typical smiley face disk space was well visualized.  Chunks of disk were removed.  Spurs were removed.  Operative microscope was draped, brought in, potential disk material was carefully teased out.  Posterior spurs at the disk space were actually touching with hypermobility.  Once these were removed, 1 and 2 mm Kerrisons with additional disk, which was protruding posteriorly particularly on the right side causing stenosis with overhanging spurs on the uncovertebral joints were stripped and continued removal of disk and ligament until the dura was completely visualized.  Trial sizes showed a 6 gave good fit, progressing from 1 side to the opposite side with removal of all chunks of disk.  The patient had primarily right-sided symptoms and 4-5 had the largest disk protrusion and spurring with nerve root compression.  Identical procedure was repeated at the C5-6 level.  C5-6 was addressed, trial sizes showed 6 gave good fit.  Allograft cortical cancellous graft was marked anteriorly.  Neck was distracted, bone graft was inserted, countersunk 1-2 mm with good stability.  At C5-6 level, another 6 mm graft was used.  There were larger anterior spurs at C5-6 level.  Less disk protruding behind the vertebral body posteriorly and less extruded disk material.  Dura was completely visualized.  Uncovertebral joints were stripped with Cloward curettes.  Graft was countersunk 2 mm. Additional bone spurs were removed so that the plate would sit out flat. A 26 mm plate was selected, 12 mm screws were used, checked under fluoroscopy.  After all screws were placed, final spot pictures were taken, and screws were locked down.  Instrument count and needle count was correct.  Hemovac  was placed with in and out technique in line with the skin incision.  3-0 platysma sutures, 4-0 subcuticular closure, tincture of benzoin, Steri-Strips, 4 x 4s, tape, and soft cervical collar.  The patient tolerated the procedure well, transferred to the recovery room in stable condition.     Candas Deemer C. Lorin Mercy, M.D.     MCY/MEDQ  D:  02/09/2014  T:  02/10/2014  Job:  [id number]

## 2014-02-10 NOTE — Progress Notes (Signed)
Patient discharged home with family. Script and discharged instructions given to patient. Patient and family stated understanding of instructions given.

## 2014-02-10 NOTE — Anesthesia Postprocedure Evaluation (Signed)
  Anesthesia Post-op Note  Patient: Emma Lewis  Procedure(s) Performed: Procedure(s) with comments: ANTERIOR CERVICAL DECOMPRESSION/DISCECTOMY FUSION 2 LEVELS (N/A) - C4-5, C5-6 Anterior Cervical Discectomy and Fusion, Allograft, Plate  Patient Location: PACU  Anesthesia Type:General  Level of Consciousness: awake  Airway and Oxygen Therapy: Patient Spontanous Breathing  Post-op Pain: mild  Post-op Assessment: Post-op Vital signs reviewed, Patient's Cardiovascular Status Stable, Respiratory Function Stable, Patent Airway, No signs of Nausea or vomiting and Pain level controlled  Post-op Vital Signs: Reviewed and stable  Last Vitals:  Filed Vitals:   02/10/14 0817  BP: 118/72  Pulse: 68  Temp: 36.6 C  Resp: 18    Complications: No apparent anesthesia complications

## 2014-02-10 NOTE — Progress Notes (Signed)
Subjective: 1 Day Post-Op Procedure(s) (LRB): ANTERIOR CERVICAL DECOMPRESSION/DISCECTOMY FUSION 2 LEVELS (N/A) Patient reports pain as mild.    Objective: Vital signs in last 24 hours: Temp:  [97.5 F (36.4 Lewis)-98.4 F (36.9 Lewis)] 97.8 F (36.6 Lewis) (07/16 0407) Pulse Rate:  [85-104] 88 (07/16 0407) Resp:  [10-22] 20 (07/16 0407) BP: (113-152)/(65-88) 115/74 mmHg (07/16 0407) SpO2:  [93 %-98 %] 94 % (07/16 0407) Weight:  [93.072 kg (205 lb 3 oz)] 93.072 kg (205 lb 3 oz) (07/15 1013)  Intake/Output from previous day: 07/15 0701 - 07/16 0700 In: 1000 [I.V.:1000] Out: 30 [Drains:30] Intake/Output this shift:    No results found for this basename: HGB,  in the last 72 hours No results found for this basename: WBC, RBC, HCT, PLT,  in the last 72 hours No results found for this basename: NA, K, CL, CO2, BUN, CREATININE, GLUCOSE, CALCIUM,  in the last 72 hours No results found for this basename: LABPT, INR,  in the last 72 hours  Neurologically intact  Assessment/Plan: 1 Day Post-Op Procedure(s) (LRB): ANTERIOR CERVICAL DECOMPRESSION/DISCECTOMY FUSION 2 LEVELS (N/A) Plan:  Home.  Collar stays on  Emma Lewis 02/10/2014, 8:13 AM

## 2014-02-11 ENCOUNTER — Encounter (HOSPITAL_COMMUNITY): Payer: Self-pay | Admitting: Orthopaedic Surgery

## 2014-02-21 NOTE — Discharge Summary (Signed)
Physician Discharge Summary  Patient ID: Emma Lewis MRN: [id number] DOB/AGE: 65-Jul-1952 65 y.o.  Admit date: 02/09/2014 Discharge date: 02/10/2014  Admission Diagnoses:  Cervical spondylosis C4-5 and C5-6 with HNP and radiculopathy  Discharge Diagnoses:  Principal Problem:   Cervical spondylosis same as above.  Past Medical History  Diagnosis Date  . PONV (postoperative nausea and vomiting)   . Bronchitis     hx of  . Pneumonia     hx of  . Hypothyroidism   . Arthritis   . Seasonal allergies   . Fever blister     Takes Acyclovir  . Hyperlipemia   . GERD (gastroesophageal reflux disease)     Surgeries: Procedure(s): ANTERIOR CERVICAL DECOMPRESSION/DISCECTOMY FUSION C4-5 and C5-6, 2 LEVELS on 02/09/2014   Consultants (if any):  none  Discharged Condition: Improved  Hospital Course: Emma Lewis is an 63 y.o. female who was admitted 02/09/2014 with a diagnosis of Cervical spondylosis and went to the operating room on 02/09/2014 and underwent the above named procedures.    She was given perioperative antibiotics:      Anti-infectives   Start     Dose/Rate Route Frequency Ordered Stop   02/10/14 1000  acyclovir (ZOVIRAX) tablet 400 mg  Status:  Discontinued     400 mg Oral Daily 02/09/14 1752 02/10/14 1257   02/09/14 2000  ceFAZolin (ANCEF) IVPB 1 g/50 mL premix     1 g 100 mL/hr over 30 Minutes Intravenous Every 8 hours 02/09/14 1752 02/10/14 0406   02/09/14 0600  ceFAZolin (ANCEF) IVPB 2 g/50 mL premix     2 g 100 mL/hr over 30 Minutes Intravenous On call to O.R. 02/08/14 1440 02/09/14 1232    Pt place in soft collar which will be worn at all times. Drain removed on first post op day.  Pt without swallowing difficulty and was taking a regular diet. Ambulatory at discharge.  She was given sequential compression devices, early ambulation for DVT prophylaxis.  She benefited maximally from the hospital stay and there were no complications.    Recent vital signs:   Filed Vitals:   02/10/14 0817  BP: 118/72  Pulse: 68  Temp: 97.9 F (36.6 C)  Resp: 18    Recent laboratory studies:  Lab Results  Component Value Date   HGB 14.3 02/02/2014   Lab Results  Component Value Date   WBC 7.1 02/02/2014   PLT 224 02/02/2014   Lab Results  Component Value Date   INR 0.97 02/02/2014   Lab Results  Component Value Date   NA 140 02/02/2014   K 4.6 02/02/2014   CL 102 02/02/2014   CO2 24 02/02/2014   BUN 11 02/02/2014   CREATININE 0.71 02/02/2014   GLUCOSE 86 02/02/2014    Discharge Medications:     Medication List    STOP taking these medications       HYDROmorphone 4 MG tablet  Commonly known as:  DILAUDID     ibuprofen 200 MG tablet  Commonly known as:  ADVIL,MOTRIN      TAKE these medications       acyclovir 400 MG tablet  Commonly known as:  ZOVIRAX  Take 400 mg by mouth daily.     CALCIUM 600 + D PO  Take 1 tablet by mouth daily.     cholecalciferol 1000 UNITS tablet  Commonly known as:  VITAMIN D  Take 1,000 Units by mouth 3 (three) times daily.     ESTER-C PO  Take 1 tablet by mouth 3 (three) times daily.     EVENING PRIMROSE OIL PO  Take 1 tablet by mouth 3 (three) times daily.     Fish Oil 1200 MG Caps  Take 1 capsule by mouth 3 (three) times daily.     FOLTANX 3-35-2 MG Tabs  Take 1 tablet by mouth daily.     levothyroxine 100 MCG tablet  Commonly known as:  SYNTHROID, LEVOTHROID  Take 100 mcg by mouth daily before breakfast.     loratadine 10 MG tablet  Commonly known as:  CLARITIN  Take 10 mg by mouth daily as needed for allergies.     MAGNESIUM PO  Take 1 tablet by mouth 3 (three) times daily.     methocarbamol 500 MG tablet  Commonly known as:  ROBAXIN  Take 1 tablet (500 mg total) by mouth every 6 (six) hours as needed for muscle spasms (spasm).     omeprazole 20 MG capsule  Commonly known as:  PRILOSEC  Take 20 mg by mouth daily.     oxyCODONE-acetaminophen 5-325 MG per tablet  Commonly known as:  ROXICET   Take 1-2 tablets by mouth every 4 (four) hours as needed.     simvastatin 40 MG tablet  Commonly known as:  ZOCOR  Take 40 mg by mouth at bedtime.     SOOTHE XP Soln  Place 1 drop into both eyes daily as needed (dry eyes).        Diagnostic Studies: Dg Chest 2 View  02/02/2014   CLINICAL DATA:  Preoperative films.  EXAM: CHEST  2 VIEW  COMPARISON:  None.  FINDINGS: Minimal linear atelectasis or scar is seen in the left lung base. Lungs are otherwise clear. Heart size is normal. No pneumothorax or pleural effusion. Remote appearing, mild T7 compression fracture is noted.  IMPRESSION: No acute disease.   Electronically Signed   By: Inge Rise M.D.   On: 02/02/2014 13:11   Dg Cervical Spine Complete  02/09/2014   CLINICAL DATA:  Status post anterior surgical fusion.  EXAM: CERVICAL SPINE  4+ VIEWS  COMPARISON:  MRI scan of Dec 07, 2013.  FINDINGS: Four intraoperative fluoroscopic images of the cervical spine were submitted for review. These images demonstrate the patient to be status post anterior surgical fusion of C4, C5 and C6. Good alignment of the vertebral bodies is noted.  IMPRESSION: Status post surgical anterior fusion of C4, C5 and C6.   Electronically Signed   By: Sabino Dick M.D.   On: 02/09/2014 16:20   Dg C-arm 1-60 Min  02/09/2014   CLINICAL DATA:  Status post anterior surgical fusion.  EXAM: CERVICAL SPINE  4+ VIEWS  COMPARISON:  MRI scan of Dec 07, 2013.  FINDINGS: Four intraoperative fluoroscopic images of the cervical spine were submitted for review. These images demonstrate the patient to be status post anterior surgical fusion of C4, C5 and C6. Good alignment of the vertebral bodies is noted.  IMPRESSION: Status post surgical anterior fusion of C4, C5 and C6.   Electronically Signed   By: Sabino Dick M.D.   On: 02/09/2014 16:20    Disposition: 01-Home or Self Care  DISCHARGE INSTRUCTIONS: No lifting greater than 10 lbs. No overhead use of arms. Avoid bending,and  twisting neck. Walk in house for first week them may start to get out slowly increasing distance up to one mile by 3 weeks post op. Keep incision dry for 3 days, may then bathe and  wet incision using a covered collar when showering. Call if any fevers >101, chills, or increasing numbness or weakness or increased swelling or drainage. Wear collar at all times.  Ice packs to incision as needed for pain and swelling.   Follow-up Information   Follow up with [name], MD. Schedule an appointment as soon as possible for a visit in 2 weeks.   Specialty:  Orthopedic Surgery   Contact information:   [place] [address] [phone]        Signed: Epimenio Foot 02/21/2014, 3:13 PM

## 2014-04-18 DIAGNOSIS — I10 Essential (primary) hypertension: Secondary | ICD-10-CM | POA: Diagnosis not present

## 2014-04-18 DIAGNOSIS — E785 Hyperlipidemia, unspecified: Secondary | ICD-10-CM | POA: Diagnosis not present

## 2014-05-03 DIAGNOSIS — M47812 Spondylosis without myelopathy or radiculopathy, cervical region: Secondary | ICD-10-CM | POA: Diagnosis not present

## 2014-06-16 DIAGNOSIS — M25552 Pain in left hip: Secondary | ICD-10-CM | POA: Diagnosis not present

## 2014-07-26 DIAGNOSIS — E039 Hypothyroidism, unspecified: Secondary | ICD-10-CM | POA: Diagnosis not present

## 2014-07-26 DIAGNOSIS — E559 Vitamin D deficiency, unspecified: Secondary | ICD-10-CM | POA: Diagnosis not present

## 2014-07-26 DIAGNOSIS — E784 Other hyperlipidemia: Secondary | ICD-10-CM | POA: Diagnosis not present

## 2014-07-26 DIAGNOSIS — R7309 Other abnormal glucose: Secondary | ICD-10-CM | POA: Diagnosis not present

## 2014-10-03 DIAGNOSIS — E559 Vitamin D deficiency, unspecified: Secondary | ICD-10-CM | POA: Diagnosis not present

## 2014-10-03 DIAGNOSIS — E784 Other hyperlipidemia: Secondary | ICD-10-CM | POA: Diagnosis not present

## 2014-10-03 DIAGNOSIS — E038 Other specified hypothyroidism: Secondary | ICD-10-CM | POA: Diagnosis not present

## 2014-10-03 DIAGNOSIS — R7309 Other abnormal glucose: Secondary | ICD-10-CM | POA: Diagnosis not present

## 2014-11-25 DIAGNOSIS — M79606 Pain in leg, unspecified: Secondary | ICD-10-CM | POA: Diagnosis not present

## 2014-11-25 DIAGNOSIS — M5136 Other intervertebral disc degeneration, lumbar region: Secondary | ICD-10-CM | POA: Diagnosis not present

## 2014-12-01 DIAGNOSIS — M5136 Other intervertebral disc degeneration, lumbar region: Secondary | ICD-10-CM | POA: Diagnosis not present

## 2014-12-01 DIAGNOSIS — M79605 Pain in left leg: Secondary | ICD-10-CM | POA: Diagnosis not present

## 2014-12-05 DIAGNOSIS — M5136 Other intervertebral disc degeneration, lumbar region: Secondary | ICD-10-CM | POA: Diagnosis not present

## 2014-12-07 DIAGNOSIS — M5136 Other intervertebral disc degeneration, lumbar region: Secondary | ICD-10-CM | POA: Diagnosis not present

## 2014-12-08 DIAGNOSIS — Z6834 Body mass index (BMI) 34.0-34.9, adult: Secondary | ICD-10-CM | POA: Diagnosis not present

## 2014-12-08 DIAGNOSIS — M25512 Pain in left shoulder: Secondary | ICD-10-CM | POA: Diagnosis not present

## 2014-12-08 DIAGNOSIS — M7522 Bicipital tendinitis, left shoulder: Secondary | ICD-10-CM | POA: Diagnosis not present

## 2014-12-12 DIAGNOSIS — M79605 Pain in left leg: Secondary | ICD-10-CM | POA: Diagnosis not present

## 2014-12-14 DIAGNOSIS — M5136 Other intervertebral disc degeneration, lumbar region: Secondary | ICD-10-CM | POA: Diagnosis not present

## 2014-12-19 DIAGNOSIS — M5136 Other intervertebral disc degeneration, lumbar region: Secondary | ICD-10-CM | POA: Diagnosis not present

## 2015-01-23 DIAGNOSIS — R748 Abnormal levels of other serum enzymes: Secondary | ICD-10-CM | POA: Diagnosis not present

## 2015-01-23 DIAGNOSIS — E784 Other hyperlipidemia: Secondary | ICD-10-CM | POA: Diagnosis not present

## 2015-01-23 DIAGNOSIS — K760 Fatty (change of) liver, not elsewhere classified: Secondary | ICD-10-CM | POA: Diagnosis not present

## 2015-01-23 DIAGNOSIS — E038 Other specified hypothyroidism: Secondary | ICD-10-CM | POA: Diagnosis not present

## 2015-01-23 DIAGNOSIS — Z23 Encounter for immunization: Secondary | ICD-10-CM | POA: Diagnosis not present

## 2015-01-24 DIAGNOSIS — M5136 Other intervertebral disc degeneration, lumbar region: Secondary | ICD-10-CM | POA: Diagnosis not present

## 2015-02-01 DIAGNOSIS — Z1231 Encounter for screening mammogram for malignant neoplasm of breast: Secondary | ICD-10-CM | POA: Diagnosis not present

## 2015-02-02 DIAGNOSIS — M5136 Other intervertebral disc degeneration, lumbar region: Secondary | ICD-10-CM | POA: Diagnosis not present

## 2015-02-09 DIAGNOSIS — M5136 Other intervertebral disc degeneration, lumbar region: Secondary | ICD-10-CM | POA: Diagnosis not present

## 2015-02-09 DIAGNOSIS — S32040D Wedge compression fracture of fourth lumbar vertebra, subsequent encounter for fracture with routine healing: Secondary | ICD-10-CM | POA: Diagnosis not present

## 2015-02-14 DIAGNOSIS — R0989 Other specified symptoms and signs involving the circulatory and respiratory systems: Secondary | ICD-10-CM | POA: Diagnosis not present

## 2015-02-14 DIAGNOSIS — R05 Cough: Secondary | ICD-10-CM | POA: Diagnosis not present

## 2015-02-14 DIAGNOSIS — Z981 Arthrodesis status: Secondary | ICD-10-CM | POA: Diagnosis not present

## 2015-02-15 DIAGNOSIS — R05 Cough: Secondary | ICD-10-CM | POA: Diagnosis not present

## 2015-02-15 DIAGNOSIS — J22 Unspecified acute lower respiratory infection: Secondary | ICD-10-CM | POA: Diagnosis not present

## 2015-02-15 DIAGNOSIS — Z6834 Body mass index (BMI) 34.0-34.9, adult: Secondary | ICD-10-CM | POA: Diagnosis not present

## 2015-02-15 DIAGNOSIS — T148 Other injury of unspecified body region: Secondary | ICD-10-CM | POA: Diagnosis not present

## 2015-02-17 ENCOUNTER — Other Ambulatory Visit (HOSPITAL_COMMUNITY): Payer: Self-pay | Admitting: Interventional Radiology

## 2015-02-17 DIAGNOSIS — M549 Dorsalgia, unspecified: Secondary | ICD-10-CM

## 2015-02-17 DIAGNOSIS — IMO0002 Reserved for concepts with insufficient information to code with codable children: Secondary | ICD-10-CM

## 2015-02-21 DIAGNOSIS — M8589 Other specified disorders of bone density and structure, multiple sites: Secondary | ICD-10-CM | POA: Diagnosis not present

## 2015-02-21 DIAGNOSIS — R29890 Loss of height: Secondary | ICD-10-CM | POA: Diagnosis not present

## 2015-02-21 DIAGNOSIS — Z13820 Encounter for screening for osteoporosis: Secondary | ICD-10-CM | POA: Diagnosis not present

## 2015-02-21 DIAGNOSIS — M8580 Other specified disorders of bone density and structure, unspecified site: Secondary | ICD-10-CM | POA: Diagnosis not present

## 2015-02-21 DIAGNOSIS — N912 Amenorrhea, unspecified: Secondary | ICD-10-CM | POA: Diagnosis not present

## 2015-02-23 ENCOUNTER — Ambulatory Visit (HOSPITAL_COMMUNITY)
Admission: RE | Admit: 2015-02-23 | Discharge: 2015-02-23 | Disposition: A | Discharge status: Home / Self Care | Payer: Medicare Other | Source: Ambulatory Visit | Attending: Interventional Radiology | Admitting: Interventional Radiology

## 2015-02-23 ENCOUNTER — Telehealth (HOSPITAL_COMMUNITY): Payer: Self-pay | Admitting: Interventional Radiology

## 2015-02-23 ENCOUNTER — Other Ambulatory Visit (HOSPITAL_COMMUNITY): Payer: Self-pay | Admitting: Interventional Radiology

## 2015-02-23 DIAGNOSIS — M4856XA Collapsed vertebra, not elsewhere classified, lumbar region, initial encounter for fracture: Secondary | ICD-10-CM | POA: Diagnosis not present

## 2015-02-23 DIAGNOSIS — IMO0002 Reserved for concepts with insufficient information to code with codable children: Secondary | ICD-10-CM

## 2015-02-23 DIAGNOSIS — M549 Dorsalgia, unspecified: Secondary | ICD-10-CM

## 2015-02-23 NOTE — Telephone Encounter (Signed)
Returned pt's call, left VM on both cell and home phone for her to call me back. JM

## 2015-02-24 ENCOUNTER — Encounter (HOSPITAL_COMMUNITY): Payer: Self-pay

## 2015-02-24 ENCOUNTER — Other Ambulatory Visit: Payer: Self-pay | Admitting: Radiology

## 2015-02-24 ENCOUNTER — Ambulatory Visit (HOSPITAL_COMMUNITY)
Admission: RE | Admit: 2015-02-24 | Discharge: 2015-02-24 | Disposition: A | Discharge status: Home / Self Care | Payer: Medicare Other | Source: Ambulatory Visit | Attending: Interventional Radiology | Admitting: Interventional Radiology

## 2015-02-24 DIAGNOSIS — IMO0002 Reserved for concepts with insufficient information to code with codable children: Secondary | ICD-10-CM

## 2015-02-24 DIAGNOSIS — M4856XA Collapsed vertebra, not elsewhere classified, lumbar region, initial encounter for fracture: Secondary | ICD-10-CM | POA: Insufficient documentation

## 2015-02-24 DIAGNOSIS — E785 Hyperlipidemia, unspecified: Secondary | ICD-10-CM | POA: Diagnosis not present

## 2015-02-24 DIAGNOSIS — E039 Hypothyroidism, unspecified: Secondary | ICD-10-CM | POA: Insufficient documentation

## 2015-02-24 DIAGNOSIS — K219 Gastro-esophageal reflux disease without esophagitis: Secondary | ICD-10-CM | POA: Insufficient documentation

## 2015-02-24 LAB — CBC WITH DIFFERENTIAL/PLATELET
BASOS ABS: 0 10*3/uL (ref 0.0–0.1)
Basophils Relative: 0 % (ref 0–1)
EOS ABS: 0.2 10*3/uL (ref 0.0–0.7)
Eosinophils Relative: 2 % (ref 0–5)
HEMATOCRIT: 40.6 % (ref 36.0–46.0)
Hemoglobin: 13.7 g/dL (ref 12.0–15.0)
Lymphocytes Relative: 29 % (ref 12–46)
Lymphs Abs: 2.3 10*3/uL (ref 0.7–4.0)
MCH: 32.3 pg (ref 26.0–34.0)
MCHC: 33.7 g/dL (ref 30.0–36.0)
MCV: 95.8 fL (ref 78.0–100.0)
MONOS PCT: 8 % (ref 3–12)
Monocytes Absolute: 0.6 10*3/uL (ref 0.1–1.0)
NEUTROS ABS: 4.8 10*3/uL (ref 1.7–7.7)
NEUTROS PCT: 61 % (ref 43–77)
Platelets: 265 10*3/uL (ref 150–400)
RBC: 4.24 MIL/uL (ref 3.87–5.11)
RDW: 13.9 % (ref 11.5–15.5)
WBC: 7.8 10*3/uL (ref 4.0–10.5)

## 2015-02-24 LAB — PROTIME-INR
INR: 1.04 (ref 0.00–1.49)
Prothrombin Time: 13.8 seconds (ref 11.6–15.2)

## 2015-02-24 LAB — BASIC METABOLIC PANEL
Anion gap: 8 (ref 5–15)
BUN: 18 mg/dL (ref 6–20)
CO2: 25 mmol/L (ref 22–32)
Calcium: 9.7 mg/dL (ref 8.9–10.3)
Chloride: 106 mmol/L (ref 101–111)
Creatinine, Ser: 0.79 mg/dL (ref 0.44–1.00)
GFR calc Af Amer: >60 mL/min (ref >60)
GFR calc non Af Amer: >60 mL/min (ref >60)
Glucose, Bld: 97 mg/dL (ref 65–99)
Potassium: 4.4 mmol/L (ref 3.5–5.1)
SODIUM: 139 mmol/L (ref 135–145)

## 2015-02-24 MED ORDER — MIDAZOLAM HCL 2 MG/2ML IJ SOLN
INTRAMUSCULAR | Status: AC | PRN
  Administered 2015-02-24 (×2): 1 mg via INTRAVENOUS

## 2015-02-24 MED ORDER — SODIUM CHLORIDE 0.9 % IV SOLN: INTRAVENOUS | Status: DC

## 2015-02-24 MED ORDER — BUPIVACAINE HCL (PF) 0.25 % IJ SOLN
INTRAMUSCULAR | Status: AC
  Filled 2015-02-24: qty 30

## 2015-02-24 MED ORDER — SODIUM CHLORIDE 0.9 % IV SOLN: INTRAVENOUS | Status: AC

## 2015-02-24 MED ORDER — FENTANYL CITRATE (PF) 100 MCG/2ML IJ SOLN
INTRAMUSCULAR | Status: AC | PRN
  Administered 2015-02-24 (×4): 25 ug via INTRAVENOUS

## 2015-02-24 MED ORDER — TOBRAMYCIN SULFATE 1.2 G IJ SOLR
INTRAMUSCULAR | Status: AC
  Filled 2015-02-24: qty 1.2

## 2015-02-24 MED ORDER — MIDAZOLAM HCL 2 MG/2ML IJ SOLN
INTRAMUSCULAR | Status: AC
  Filled 2015-02-24: qty 4

## 2015-02-24 MED ORDER — FENTANYL CITRATE (PF) 100 MCG/2ML IJ SOLN
INTRAMUSCULAR | Status: AC
  Filled 2015-02-24: qty 4

## 2015-02-24 MED ORDER — CEFAZOLIN SODIUM-DEXTROSE 2-3 GM-% IV SOLR
INTRAVENOUS | Status: AC
  Filled 2015-02-24: qty 50

## 2015-02-24 MED ORDER — CEFAZOLIN SODIUM-DEXTROSE 2-3 GM-% IV SOLR: 2.0000 g | Freq: Once | INTRAVENOUS | Status: DC

## 2015-02-24 MED ORDER — IOHEXOL 300 MG/ML  SOLN
50.0000 mL | Freq: Once | INTRAMUSCULAR | Status: AC | PRN
  Administered 2015-02-24: 1 mL via INTRAVENOUS

## 2015-02-24 NOTE — Sedation Documentation (Signed)
Patient denies pain and is resting comfortably.  

## 2015-02-24 NOTE — Discharge Instructions (Signed)
1. No stooping,bending or lifting more than 10 lbs for 2 weeks. °2.Use walker to ambulate for 2 weeks . °3.RTC in 2 weeks °KYPHOPLASTY/VERTEBROPLASTY DISCHARGE INSTRUCTIONS ° °Medications: (check all that apply) ° °   Resume all home medications as before procedure. °   °           °  °Continue your pain medications as prescribed as needed.  Over the next 3-5 days, decrease your pain medication as tolerated.  Over the counter medications (i.e. Tylenol, ibuprofen, and aleve) may be substituted once severe/moderate pain symptoms have subsided. ° ° Wound Care: °- Bandages may be removed the day following your procedure.  You may get your incision wet once bandages are removed.  Bandaids may be used to cover the incisions until scab formation.  Topical ointments are optional. ° °- If you develop a fever greater than 101 degrees, have increased skin redness at the incision sites or pus-like oozing from incisions occurring within 1 week of the procedure, contact radiology at [phone] or [phone]. ° °- Ice pack to back for 15-20 minutes 2-3 time per day for first 2-3 days post procedure.  The ice will expedite muscle healing and help with the pain from the incisions. ° ° Activity: °- Bedrest today with limited activity for 24 hours post procedure. ° °- No driving for 48 hours. ° °- Increase your activity as tolerated after bedrest (with assistance if necessary). ° °- Refrain from any strenuous activity or heavy lifting (greater than 10 lbs.). ° ° Follow up: °- Contact radiology at [phone] or [phone] if any questions/concerns. ° °- A physician assistant from radiology will contact you in approximately 1 week. ° °- If a biopsy was performed at the time of your procedure, your referring physician should receive the results in usually 2-3 days. ° ° ° ° ° ° ° ° °

## 2015-02-24 NOTE — H&P (Signed)
Chief Complaint: Patient was seen in consultation today for L4 compression fracture at the request of Dr. Nelva Bush  Referring Physician(s): Dr. Nelva Bush  History of Present Illness: Emma Lewis is a 64 y.o. female who c/o lower back pain 10/10 without loss of bowel or bladder function x 3-4 months without any known injury, but does have frequent coughing spells and recently diagnosed with bronchitis. She has had a MRI done which revealed acute compression fracture at L4 and was referred to Dr. Estanislado Pandy for VP/KP. She states she takes NSAID throughout the day and narcotic pain medication in the evenings without significant relief. She denies any chest pain, shortness of breath or palpitations. She denies any active signs of bleeding or excessive bruising. She denies any recent fever or chills. The patient denies any history of sleep apnea or chronic oxygen use. She has previously tolerated sedation without complications.    Past Medical History  Diagnosis Date  . PONV (postoperative nausea and vomiting)   . Bronchitis     hx of  . Pneumonia     hx of  . Hypothyroidism   . Arthritis   . Seasonal allergies   . Fever blister     Takes Acyclovir  . Hyperlipemia   . GERD (gastroesophageal reflux disease)     Past Surgical History  Procedure Laterality Date  . Ganglion cyst excision Left 1970s    wrist  . Knee surgery Right   . Shoulder open rotator cuff repair Right   . Shoulder surgery Right   . Joint replacement    . Hip arthroplasty Left   . Colonoscopy    . Anterior cervical decomp/discectomy fusion N/A 02/09/2014    Procedure: ANTERIOR CERVICAL DECOMPRESSION/DISCECTOMY FUSION 2 LEVELS;  Surgeon: [name], MD;  Location: [facility];  Service: Orthopedics;  Laterality: N/A;  C4-5, C5-6 Anterior Cervical Discectomy and Fusion, Allograft, Plate    Allergies: Sulfur  Medications: Prior to Admission medications   Medication Sig Start Date End Date Taking? Authorizing Provider    acyclovir (ZOVIRAX) 400 MG tablet Take 400 mg by mouth daily.    Historical Provider, MD  Artificial Tear Solution (SOOTHE XP) SOLN Place 1 drop into both eyes daily as needed (dry eyes).    Historical Provider, MD  Calcium Carb-Cholecalciferol (CALCIUM 600 + D PO) Take 1 tablet by mouth daily.    Historical Provider, MD  cholecalciferol (VITAMIN D) 1000 UNITS tablet Take 1,000 Units by mouth 3 (three) times daily.    Historical Provider, MD  EVENING PRIMROSE OIL PO Take 1 tablet by mouth 3 (three) times daily.    Historical Provider, MD  levothyroxine (SYNTHROID, LEVOTHROID) 100 MCG tablet Take 100 mcg by mouth daily before breakfast.    Historical Provider, MD  loratadine (CLARITIN) 10 MG tablet Take 10 mg by mouth daily as needed for allergies.    Historical Provider, MD  MAGNESIUM PO Take 1 tablet by mouth 3 (three) times daily.    Historical Provider, MD  methocarbamol (ROBAXIN) 500 MG tablet Take 1 tablet (500 mg total) by mouth every 6 (six) hours as needed for muscle spasms (spasm). 02/09/14   Phillips Hay, PA-C  Omega-3 Fatty Acids (FISH OIL) 1200 MG CAPS Take 1 capsule by mouth 3 (three) times daily.    Historical Provider, MD  omeprazole (PRILOSEC) 20 MG capsule Take 20 mg by mouth daily.    Historical Provider, MD  oxyCODONE-acetaminophen (ROXICET) 5-325 MG per tablet Take 1-2 tablets by mouth  every 4 (four) hours as needed. 02/09/14   Phillips Hay, PA-C  simvastatin (ZOCOR) 40 MG tablet Take 40 mg by mouth at bedtime.    Historical Provider, MD  Vitamin Mixture (ESTER-C PO) Take 1 tablet by mouth 3 (three) times daily.    Historical Provider, MD     History reviewed. No pertinent family history.  History   Social History  . Marital Status: Married    Spouse Name: N/A  . Number of Children: N/A  . Years of Education: N/A   Social History Main Topics  . Smoking status: Never Smoker   . Smokeless tobacco: Never Used  . Alcohol Use: Yes     Comment: rare  . Drug Use: No  .  Sexual Activity: Not on file   Other Topics Concern  . None   Social History Narrative   Review of Systems: A 12 point ROS discussed and pertinent positives are indicated in the HPI above.  All other systems are negative.  Review of Systems  Vital Signs: BP 132/70 mmHg  Pulse 77  Temp(Src) 97.3 F (36.3 C) (Oral)  Resp 16  Ht 5\' 5"  (1.651 m)  Wt 200 lb (90.719 kg)  BMI 33.28 kg/m2  SpO2 96%  Physical Exam  Constitutional: She is oriented to person, place, and time. No distress.  HENT:  Head: Normocephalic and atraumatic.  Neck: No tracheal deviation present.  Cardiovascular: Normal rate and regular rhythm.  Exam reveals no gallop and no friction rub.   No murmur heard. Pulmonary/Chest: Effort normal and breath sounds normal. No respiratory distress. She has no wheezes. She has no rales.  Abdominal: Soft. Bowel sounds are normal. She exhibits no distension. There is no tenderness.  Musculoskeletal:  L4 TTP, no skin changes  Neurological: She is alert and oriented to person, place, and time.  Skin: She is not diaphoretic.    Mallampati Score:  MD Evaluation Airway: WNL Heart: WNL Abdomen: WNL Chest/ Lungs: WNL ASA  Classification: 2 Mallampati/Airway Score: One  Imaging: No results found.  Labs:  CBC: No results for input(s): WBC, HGB, HCT, PLT in the last 8760 hours.  COAGS: No results for input(s): INR, APTT in the last 8760 hours.  BMP: No results for input(s): NA, K, CL, CO2, GLUCOSE, BUN, CALCIUM, CREATININE, GFRNONAA, GFRAA in the last 8760 hours.  Invalid input(s): CMP  Assessment and Plan: Uncontrolled back pain  Acute compression fracture L4 Seen by Dr. Nelva Bush and Dr. Estanislado Pandy, scheduled today for image guided L4 vertebroplasty/kyphoplasty with sedation The patient has been NPO, no blood thinners taken, labs and vitals have been reviewed. Risks and Benefits discussed with the patient including, but not limited to education regarding the  natural healing process of compression fractures without intervention, bleeding, infection, cement migration which may cause spinal cord damage, paralysis, pulmonary embolism or even death. All of the patient's questions were answered, patient is agreeable to proceed. Consent signed and in chart. Bronchitis on antibiotics x 1 week, afebrile, denies productive cough   Thank you for this interesting consult.  I greatly enjoyed meeting Emma Lewis and look forward to participating in their care.  A copy of this report was sent to the requesting provider on this date.  SignedHedy Jacob 02/24/2015, 11:01 AM   I spent a total of 30 Minutes in face to face in clinical consultation, greater than 50% of which was counseling/coordinating care for acute compression fracture L4 with uncontrolled back pain.

## 2015-02-24 NOTE — Procedures (Signed)
S/P L4 balloon KP 

## 2015-03-06 DIAGNOSIS — G8929 Other chronic pain: Secondary | ICD-10-CM | POA: Diagnosis not present

## 2015-03-06 DIAGNOSIS — Z6833 Body mass index (BMI) 33.0-33.9, adult: Secondary | ICD-10-CM | POA: Diagnosis not present

## 2015-03-06 DIAGNOSIS — R3915 Urgency of urination: Secondary | ICD-10-CM | POA: Diagnosis not present

## 2015-03-06 DIAGNOSIS — M542 Cervicalgia: Secondary | ICD-10-CM | POA: Diagnosis not present

## 2015-03-06 DIAGNOSIS — L309 Dermatitis, unspecified: Secondary | ICD-10-CM | POA: Diagnosis not present

## 2015-03-14 ENCOUNTER — Other Ambulatory Visit (HOSPITAL_COMMUNITY): Payer: Self-pay | Admitting: Interventional Radiology

## 2015-03-14 DIAGNOSIS — IMO0002 Reserved for concepts with insufficient information to code with codable children: Secondary | ICD-10-CM

## 2015-03-20 ENCOUNTER — Telehealth: Payer: Self-pay | Admitting: Radiology

## 2015-03-20 NOTE — Progress Notes (Signed)
Pt calls to complain of back pain  L4 KP 02/27/15 with Dr Estanislado Pandy Says pain is unchanged Denies fever Denies site redness or sign of infection Pain is in same area No new injury States difficult to stand for any period Taking Hydrocodone 1 po every 6 hrs  Rec: increase Hydrocodone to 2 po every 8 hrs Use ice or heat to area I will discuss with Dr Estanislado Pandy upon his return 8/23 Will call pt with plan Asked pt to come to ED if pain is where she cannot find relief  She is agreeable to plan

## 2015-03-28 ENCOUNTER — Other Ambulatory Visit (HOSPITAL_COMMUNITY): Payer: Self-pay | Admitting: Interventional Radiology

## 2015-03-28 ENCOUNTER — Ambulatory Visit (HOSPITAL_COMMUNITY)
Admission: RE | Admit: 2015-03-28 | Discharge: 2015-03-28 | Disposition: A | Discharge status: Home / Self Care | Payer: Medicare Other | Source: Ambulatory Visit | Attending: Interventional Radiology | Admitting: Interventional Radiology

## 2015-03-28 DIAGNOSIS — M549 Dorsalgia, unspecified: Secondary | ICD-10-CM

## 2015-03-28 DIAGNOSIS — M545 Low back pain: Secondary | ICD-10-CM | POA: Diagnosis not present

## 2015-03-28 DIAGNOSIS — IMO0002 Reserved for concepts with insufficient information to code with codable children: Secondary | ICD-10-CM

## 2015-03-29 ENCOUNTER — Ambulatory Visit
Admission: RE | Admit: 2015-03-29 | Discharge: 2015-03-29 | Disposition: A | Discharge status: Home / Self Care | Payer: Medicare Other | Source: Ambulatory Visit | Attending: Interventional Radiology | Admitting: Interventional Radiology

## 2015-03-29 DIAGNOSIS — M549 Dorsalgia, unspecified: Secondary | ICD-10-CM

## 2015-03-29 DIAGNOSIS — M4856XA Collapsed vertebra, not elsewhere classified, lumbar region, initial encounter for fracture: Secondary | ICD-10-CM | POA: Diagnosis not present

## 2015-03-29 DIAGNOSIS — N839 Noninflammatory disorder of ovary, fallopian tube and broad ligament, unspecified: Secondary | ICD-10-CM | POA: Diagnosis not present

## 2015-03-29 DIAGNOSIS — IMO0002 Reserved for concepts with insufficient information to code with codable children: Secondary | ICD-10-CM

## 2015-03-29 DIAGNOSIS — X58XXXA Exposure to other specified factors, initial encounter: Secondary | ICD-10-CM | POA: Insufficient documentation

## 2015-03-29 DIAGNOSIS — T148 Other injury of unspecified body region: Secondary | ICD-10-CM | POA: Diagnosis present

## 2015-03-30 ENCOUNTER — Other Ambulatory Visit (HOSPITAL_COMMUNITY): Payer: Self-pay | Admitting: Interventional Radiology

## 2015-03-30 DIAGNOSIS — IMO0002 Reserved for concepts with insufficient information to code with codable children: Secondary | ICD-10-CM

## 2015-03-30 DIAGNOSIS — S32030A Wedge compression fracture of third lumbar vertebra, initial encounter for closed fracture: Secondary | ICD-10-CM

## 2015-03-30 DIAGNOSIS — M549 Dorsalgia, unspecified: Secondary | ICD-10-CM

## 2015-03-30 DIAGNOSIS — S32050A Wedge compression fracture of fifth lumbar vertebra, initial encounter for closed fracture: Secondary | ICD-10-CM

## 2015-03-31 ENCOUNTER — Other Ambulatory Visit (HOSPITAL_COMMUNITY): Payer: Self-pay | Admitting: Interventional Radiology

## 2015-03-31 ENCOUNTER — Other Ambulatory Visit: Payer: Self-pay | Admitting: Radiology

## 2015-03-31 ENCOUNTER — Ambulatory Visit (HOSPITAL_COMMUNITY)
Admission: RE | Admit: 2015-03-31 | Discharge: 2015-03-31 | Disposition: A | Discharge status: Home / Self Care | Payer: Medicare Other | Source: Ambulatory Visit | Attending: Interventional Radiology | Admitting: Interventional Radiology

## 2015-03-31 ENCOUNTER — Encounter (HOSPITAL_COMMUNITY): Payer: Self-pay

## 2015-03-31 DIAGNOSIS — E039 Hypothyroidism, unspecified: Secondary | ICD-10-CM | POA: Insufficient documentation

## 2015-03-31 DIAGNOSIS — S32050A Wedge compression fracture of fifth lumbar vertebra, initial encounter for closed fracture: Secondary | ICD-10-CM

## 2015-03-31 DIAGNOSIS — M4856XA Collapsed vertebra, not elsewhere classified, lumbar region, initial encounter for fracture: Secondary | ICD-10-CM | POA: Diagnosis not present

## 2015-03-31 DIAGNOSIS — M1990 Unspecified osteoarthritis, unspecified site: Secondary | ICD-10-CM | POA: Diagnosis not present

## 2015-03-31 DIAGNOSIS — M549 Dorsalgia, unspecified: Secondary | ICD-10-CM

## 2015-03-31 DIAGNOSIS — K219 Gastro-esophageal reflux disease without esophagitis: Secondary | ICD-10-CM | POA: Diagnosis not present

## 2015-03-31 DIAGNOSIS — S32030A Wedge compression fracture of third lumbar vertebra, initial encounter for closed fracture: Secondary | ICD-10-CM

## 2015-03-31 DIAGNOSIS — E785 Hyperlipidemia, unspecified: Secondary | ICD-10-CM | POA: Diagnosis not present

## 2015-03-31 LAB — CBC
HCT: 41.2 % (ref 36.0–46.0)
HEMOGLOBIN: 13.8 g/dL (ref 12.0–15.0)
MCH: 32.5 pg (ref 26.0–34.0)
MCHC: 33.5 g/dL (ref 30.0–36.0)
MCV: 97.2 fL (ref 78.0–100.0)
Platelets: 236 10*3/uL (ref 150–400)
RBC: 4.24 MIL/uL (ref 3.87–5.11)
RDW: 13.9 % (ref 11.5–15.5)
WBC: 6.8 10*3/uL (ref 4.0–10.5)

## 2015-03-31 LAB — PROTIME-INR
INR: 1.12 (ref 0.00–1.49)
PROTHROMBIN TIME: 14.5 s (ref 11.6–15.2)

## 2015-03-31 LAB — BASIC METABOLIC PANEL
ANION GAP: 7 (ref 5–15)
BUN: 13 mg/dL (ref 6–20)
CALCIUM: 9.5 mg/dL (ref 8.9–10.3)
CO2: 25 mmol/L (ref 22–32)
Chloride: 104 mmol/L (ref 101–111)
Creatinine, Ser: 0.72 mg/dL (ref 0.44–1.00)
GLUCOSE: 97 mg/dL (ref 65–99)
Potassium: 5.8 mmol/L — ABNORMAL HIGH (ref 3.5–5.1)
SODIUM: 136 mmol/L (ref 135–145)

## 2015-03-31 LAB — APTT: APTT: 27 s (ref 24–37)

## 2015-03-31 MED ORDER — SODIUM CHLORIDE 0.9 % IV SOLN
Freq: Once | INTRAVENOUS | Status: AC
  Administered 2015-03-31: 12:00:00 via INTRAVENOUS

## 2015-03-31 MED ORDER — HYDROMORPHONE HCL 1 MG/ML IJ SOLN
INTRAMUSCULAR | Status: AC | PRN
  Administered 2015-03-31: 1 mg via INTRAVENOUS

## 2015-03-31 MED ORDER — TOBRAMYCIN SULFATE 1.2 G IJ SOLR
INTRAMUSCULAR | Status: DC
Start: 2015-03-31 — End: 2015-04-01
  Filled 2015-03-31: qty 1.2

## 2015-03-31 MED ORDER — MIDAZOLAM HCL 2 MG/2ML IJ SOLN
INTRAMUSCULAR | Status: AC
  Filled 2015-03-31: qty 6

## 2015-03-31 MED ORDER — FENTANYL CITRATE (PF) 100 MCG/2ML IJ SOLN
INTRAMUSCULAR | Status: AC | PRN
  Administered 2015-03-31: 12.5 ug via INTRAVENOUS
  Administered 2015-03-31 (×2): 25 ug via INTRAVENOUS

## 2015-03-31 MED ORDER — HYDROMORPHONE HCL 1 MG/ML IJ SOLN
INTRAMUSCULAR | Status: AC
  Filled 2015-03-31: qty 3

## 2015-03-31 MED ORDER — LORAZEPAM 2 MG/ML IJ SOLN
INTRAMUSCULAR | Status: AC | PRN
  Administered 2015-03-31: 0.5 mg via INTRAVENOUS

## 2015-03-31 MED ORDER — BUPIVACAINE HCL (PF) 0.25 % IJ SOLN
INTRAMUSCULAR | Status: AC
  Filled 2015-03-31: qty 30

## 2015-03-31 MED ORDER — CEFAZOLIN SODIUM-DEXTROSE 2-3 GM-% IV SOLR: 2.0000 g | Freq: Once | INTRAVENOUS | Status: DC

## 2015-03-31 MED ORDER — SODIUM CHLORIDE 0.9 % IV SOLN: INTRAVENOUS | Status: AC

## 2015-03-31 MED ORDER — CEFAZOLIN SODIUM-DEXTROSE 2-3 GM-% IV SOLR
INTRAVENOUS | Status: AC
  Administered 2015-03-31: 2 g
  Filled 2015-03-31: qty 50

## 2015-03-31 MED ORDER — MIDAZOLAM HCL 2 MG/2ML IJ SOLN
INTRAMUSCULAR | Status: AC | PRN
  Administered 2015-03-31 (×2): 1 mg via INTRAVENOUS

## 2015-03-31 MED ORDER — FENTANYL CITRATE (PF) 100 MCG/2ML IJ SOLN
INTRAMUSCULAR | Status: AC
  Filled 2015-03-31: qty 4

## 2015-03-31 MED ORDER — GELATIN ABSORBABLE 12-7 MM EX MISC
CUTANEOUS | Status: DC
Start: 2015-03-31 — End: 2015-04-01
  Filled 2015-03-31: qty 1

## 2015-03-31 NOTE — Sedation Documentation (Signed)
Patient is resting comfortably. 

## 2015-03-31 NOTE — Sedation Documentation (Signed)
Patient denies pain and is resting comfortably.  

## 2015-03-31 NOTE — Procedures (Signed)
S/P L3 and L5 VP

## 2015-03-31 NOTE — Sedation Documentation (Signed)
250cc fluid bolus given

## 2015-03-31 NOTE — Discharge Instructions (Signed)
1.1. No stooping,bending or lifting more than 10 lbs for 2 weeks. 2.Use walker to ambulate  For 2 weeks. 3.No driving for 2 weeks.Marland Kitchen 4.RTC in 2 weeks  Pt was also told to take motrin 200 mg 3 x a day for 3 days for c/o left foot numbness// see note.  KYPHOPLASTY/VERTEBROPLASTY DISCHARGE INSTRUCTIONS  Medications:     Resume all home medications as before procedure.    .                  Continue your pain medications as prescribed as needed.  Over the next 3-5 days, decrease your pain medication as tolerated.  Over the counter medications (i.e. Tylenol, ibuprofen, and aleve) may be substituted once severe/moderate pain symptoms have subsided.   Wound Care: - Bandages may be removed the day following your procedure.  You may get your incision wet once bandages are removed.  Bandaids may be used to cover the incisions until scab formation.  Topical ointments are optional.  - If you develop a fever greater than 101 degrees, have increased skin redness at the incision sites or pus-like oozing from incisions occurring within 1 week of the procedure, contact radiology at [phone] or [phone].  - Ice pack to back for 15-20 minutes 2-3 time per day for first 2-3 days post procedure.  The ice will expedite muscle healing and help with the pain from the incisions.   Activity: - Bedrest today with limited activity for 24 hours post procedure.  - No driving for 48 hours.  - Increase your activity as tolerated after bedrest (with assistance if necessary).  - Refrain from any strenuous activity or heavy lifting (greater than 10 lbs.).   Follow up: - Contact radiology at [phone] or [phone] if any questions/concerns.  - A physician assistant from radiology will contact you in approximately 1 week.  - If a biopsy was performed at the time of your procedure, your referring physician should receive the results in usually 2-3 days.

## 2015-03-31 NOTE — H&P (Signed)
Chief Complaint: Patient was seen in consultation today for Lumbar 3 and Lumbar 5 painful fracture at the request of Dr Nelva Bush  Referring Physician(s): Dr Nelva Bush  History of Present Illness: Emma Lewis is a 64 y.o. female   Hx L4 Kyphoplasty 02/24/2015 Pain never really resolved Worsened actually over several weeks Hydrocodone was without relief Follow up with Dr Estanislado Pandy New MRI 8/31: IMPRESSION: 1. Acute L3 and L5 compression fractures with mild height loss. 2. L4 vertebroplasty with stable mild height loss compared to intraprocedural fluoroscopy  Now scheduled for L3 and L5 KP/VP  Past Medical History  Diagnosis Date  . PONV (postoperative nausea and vomiting)   . Bronchitis     hx of  . Pneumonia     hx of  . Hypothyroidism   . Arthritis   . Seasonal allergies   . Fever blister     Takes Acyclovir  . Hyperlipemia   . GERD (gastroesophageal reflux disease)     Past Surgical History  Procedure Laterality Date  . Ganglion cyst excision Left 1970s    wrist  . Knee surgery Right   . Shoulder open rotator cuff repair Right   . Shoulder surgery Right   . Joint replacement    . Hip arthroplasty Left   . Colonoscopy    . Anterior cervical decomp/discectomy fusion N/A 02/09/2014    Procedure: ANTERIOR CERVICAL DECOMPRESSION/DISCECTOMY FUSION 2 LEVELS;  Surgeon: [name], MD;  Location: [facility];  Service: Orthopedics;  Laterality: N/A;  C4-5, C5-6 Anterior Cervical Discectomy and Fusion, Allograft, Plate    Allergies: Sulfa antibiotics and Sulfur  Medications: Prior to Admission medications   Medication Sig Start Date End Date Taking? Authorizing Provider  acyclovir (ZOVIRAX) 400 MG tablet Take 400 mg by mouth daily.   Yes Historical Provider, MD  Calcium Carb-Cholecalciferol (CALCIUM 600 + D PO) Take 1 tablet by mouth daily.   Yes Historical Provider, MD  cholecalciferol (VITAMIN D) 1000 UNITS tablet Take 1,000 Units by mouth 3 (three) times daily.   Yes  Historical Provider, MD  EVENING PRIMROSE OIL PO Take 1 tablet by mouth 3 (three) times daily.   Yes Historical Provider, MD  HYDROcodone-acetaminophen (NORCO/VICODIN) 5-325 MG per tablet Take 1 tablet by mouth every 6 (six) hours as needed for moderate pain.   Yes Historical Provider, MD  ibuprofen (ADVIL,MOTRIN) 200 MG tablet Take 800 mg by mouth every 6 (six) hours as needed (for pain).   Yes Historical Provider, MD  levothyroxine (SYNTHROID, LEVOTHROID) 100 MCG tablet Take 100 mcg by mouth daily before breakfast.   Yes Historical Provider, MD  MAGNESIUM PO Take 1 tablet by mouth 3 (three) times daily.   Yes Historical Provider, MD  Omega-3 Fatty Acids (FISH OIL) 1200 MG CAPS Take 1 capsule by mouth 3 (three) times daily.   Yes Historical Provider, MD  omeprazole (PRILOSEC) 20 MG capsule Take 20 mg by mouth daily.   Yes Historical Provider, MD  Vitamin Mixture (ESTER-C PO) Take 1 tablet by mouth 3 (three) times daily.   Yes Historical Provider, MD  amoxicillin-clavulanate (AUGMENTIN) 875-125 MG per tablet Take 1 tablet by mouth 2 (two) times daily. Started on 8-20 for 14 day course of treatment    Historical Provider, MD  Artificial Tear Solution (SOOTHE XP) SOLN Place 1 drop into both eyes daily as needed (dry eyes).    Historical Provider, MD  Biotin (BIOTIN MAXIMUM STRENGTH) 10 MG TABS Take 10,000 mg by mouth daily.  Historical Provider, MD  loratadine (CLARITIN) 10 MG tablet Take 10 mg by mouth daily as needed for allergies.    Historical Provider, MD     History reviewed. No pertinent family history.  Social History   Social History  . Marital Status: Married    Spouse Name: N/A  . Number of Children: N/A  . Years of Education: N/A   Social History Main Topics  . Smoking status: Never Smoker   . Smokeless tobacco: Never Used  . Alcohol Use: Yes     Comment: rare  . Drug Use: No  . Sexual Activity: Not Asked   Other Topics Concern  . None   Social History Narrative     Review of Systems: A 12 point ROS discussed and pertinent positives are indicated in the HPI above.  All other systems are negative.  Review of Systems  Constitutional: Positive for activity change and fatigue. Negative for fever.  Respiratory: Negative for shortness of breath.   Cardiovascular: Negative for chest pain.  Gastrointestinal: Negative for nausea and vomiting.  Musculoskeletal: Positive for back pain.  Neurological: Negative for weakness.  Psychiatric/Behavioral: Negative for behavioral problems and confusion.    Vital Signs: BP 131/81 mmHg  Pulse 77  Temp(Src) 98.1 F (36.7 C) (Oral)  Resp 18  Ht 5\' 5"  (1.651 m)  Wt 200 lb (90.719 kg)  BMI 33.28 kg/m2  SpO2 98%  Physical Exam  Constitutional: She is oriented to person, place, and time. She appears well-nourished.  Cardiovascular: Normal rate, regular rhythm and normal heart sounds.   Pulmonary/Chest: Effort normal and breath sounds normal. She has no wheezes.  Abdominal: Soft. Bowel sounds are normal. There is no tenderness.  Musculoskeletal: Normal range of motion.  Mid back pain  Neurological: She is alert and oriented to person, place, and time.  Skin: Skin is warm and dry.  Psychiatric: She has a normal mood and affect. Her behavior is normal. Judgment and thought content normal.  Nursing note and vitals reviewed.   Mallampati Score:  MD Evaluation Airway: WNL Heart: WNL Abdomen: WNL Chest/ Lungs: WNL ASA  Classification: 2 Mallampati/Airway Score: Two  Imaging: Mr Lumbar Spine Wo Contrast  03/29/2015   CLINICAL DATA:  Bilateral back pain. History of compression fracture.  EXAM: MRI LUMBAR SPINE WITHOUT CONTRAST  TECHNIQUE: Multiplanar, multisequence MR imaging of the lumbar spine was performed. No intravenous contrast was administered.  COMPARISON:  06/19/2009  FINDINGS: Horizontal hypointense fracture cleft to the lower L5 body with surrounding marrow edema. Height loss is approximately 25%.  When accounting for spurring, no retropulsion. No posterior element involvement or signs of pathologic fracture.  L3 superior endplate fracture with active marrow edema. Height loss is 25-50%. No retropulsion or posterior element involvement. No subluxation.  Recent L4 kyphoplasty with residual surrounding edema. Fluid is still visible in the bipedicular needle tracks. T11 and T12 superior endplate deformities are chronic and mild.  Normal conus signal and morphology. No retroperitoneal findings of note.  Degenerative changes:  T12- L1: Mild disc narrowing and annulus bulging. No herniation or stenosis.  L1-L2: Mild endplate spurring to the left far-lateral region. No herniation or stenosis.  L2-L3: Disc narrowing and bulging which has developed after fracture. Mild facet and ligament overgrowth. No impingement.  L3-L4: Disc bulging with endplate spurring greater to the right. Moderate facet arthropathy. Interspinous edema suggesting bursa formation. No herniation or stenosis.  L4-L5: Mild disc narrowing and bulging. Posterior annular fissure. Mild facet overgrowth.  L5-S1:Mild disc narrowing and  bulging. Facet arthropathy with moderate bilateral overgrowth.  IMPRESSION: 1. Acute L3 and L5 compression fractures with mild height loss. 2. L4 vertebroplasty with stable mild height loss compared to intraprocedural fluoroscopy.   Electronically Signed   By: Monte Fantasia M.D.   On: 03/29/2015 17:28   Mr Sacrum/si Joints Wo Contrast  03/29/2015   CLINICAL DATA:  Back pain.  L3-L5 compression fractures.  EXAM: MR SACRUM WITHOUT CONTRAST  TECHNIQUE: Multiplanar, multisequence MR imaging was performed. No intravenous contrast was administered.  COMPARISON:  03/29/2015  FINDINGS: The inferior endplate compression fracture at L5 is again observed. Vertebral augmentation at L4.  Sacroiliac joints normal, without erosion or abnormal widening.  Metal artifact in the vicinity of the left hip joint.  No abnormal edema signal  in the sacrum or coccyx. No sacral fracture or presacral edema.  There is low-level edema in the right spinalis thoracis muscle posterior to the sacrum, as on images 8-13 of series 4.  There is a right ovarian mass measuring approximately 6.4 by 3.9 by 5.0 cm, with high T1 elements and some high and low T2 signal elements. In general I favor a dermoid cyst.  IMPRESSION: 1. No acute abnormality of the sacrum. There is an inferior endplate compression fracture at L5 as shown on recent lumbar spine MRI, and low-level edema tracks behind the sacrum within the right spinalis thoracis muscle. 2. High right ovarian mass, most likely a dermoid given the signal characteristics. I do recommend that this be confirmed by pelvic sonography.   Electronically Signed   By: Van Clines M.D.   On: 03/29/2015 17:38   Ir Radiologist Eval & Mgmt  03/29/2015   EXAM: ESTABLISHED PATIENT OFFICE VISIT  CHIEF COMPLAINT: Persistent low back pain.  Current Pain Level: 1-10  HISTORY OF PRESENT ILLNESS: The patient is a 64 year old right-handed lady who is status post balloon kyphoplasty at L4 on 02/24/2015.  The patient returns today for a delayed postprocedural follow-up.  The patient is accompanied by her husband.  According to them, the patient apparently has had no significant improvement in her pain symptoms since the procedure.  She continues to have a 10 out of 10 pain on an almost constant basis worsening with prolonged standing. The patient is still unable to stoop, bend or lift.  On occasions she has had use her cane. She believes she may be ambulating a little bit better since the procedure.  Otherwise, she says that the pain continues to be in the lumbosacral region, and not in her tailbone.  There has been an intermittent sharp radiation involving the entire left thigh above the level of the knee which has since stopped.  She denies any recent chills, fevers or rigors. She denies any autonomic dysfunction of her bowel or  bladder activity.  Her appetite is normal.  The patient continues to take up to 2 hydrocodone every 6 hours achieving mild relief, in the sense that it dulls the pain.  She maintains that her sleeping is also disturbed because of this pain.  Both the patient and her spouse are a little bit perturbed with the inability to be able to schedule an appointment postprocedure despite multiple attempts.  Past medical history and past surgical history remained unchanged.  Her medications remain stable as per her H and P from 02/24/2015.  As mentioned above she takes 2 hydrocodone every 6-8 hours with resultant mild dulling of her pain.  Allergies:  Sulfa drugs.  Social history and family history as  previously unchanged.  REVIEW OF SYSTEMS: Review of systems, otherwise, unremarkable.  PHYSICAL EXAMINATION: The patient was a little bit upset with normal affect. Appears mildly distressed mostly because of frustration and not having had relief of her pain, and also with the inability to schedule a follow-up appointment. The patient exhibits moderately severe tenderness in the midline and in the lower lumbosacral region.  ASSESSMENT AND PLAN: The patient's preprocedural MRI scan, and also the immediate post treatment radiographs of the balloon kyphoplasty and L4 were reviewed with the patient and her spouse.  It was felt that given the patient's continued pain in the lumbosacral region graded a 10 out of 10, further evaluation with an MRI scan of the lumbosacral spine to evaluate for a new fracture/infection, the latter felt to be less likely, was indicated at this time.  She was advised to continue being conservative in terms of her motor activity. She was again advised not to lift anything above 10 lbs and to avoid stooping or bending.  An MRI of the lumbosacral spine to include the sacrum will be scheduled. Further recommendations depending on the MRI results. Both the patient and her spouse leave with good understanding and  agreement with the above management plan.   Electronically Signed   By: Luanne Bras M.D.   On: 03/28/2015 14:15    Labs:  CBC:  Recent Labs  02/24/15 1100 03/31/15 1157  WBC 7.8 6.8  HGB 13.7 13.8  HCT 40.6 41.2  PLT 265 236    COAGS:  Recent Labs  02/24/15 1100 03/31/15 1157  INR 1.04 1.12  APTT  --  27    BMP:  Recent Labs  02/24/15 1100 03/31/15 1157  NA 139 136  K 4.4 5.8*  CL 106 104  CO2 25 25  GLUCOSE 97 97  BUN 18 13  CALCIUM 9.7 9.5  CREATININE 0.79 0.72  GFRNONAA >60 >60  GFRAA >60 >60    LIVER FUNCTION TESTS: No results for input(s): BILITOT, AST, ALT, ALKPHOS, PROT, ALBUMIN in the last 8760 hours.  TUMOR MARKERS: No results for input(s): AFPTM, CEA, CA199, CHROMGRNA in the last 8760 hours.  Assessment and Plan:  Hx L3 KP 7/29 No improvement in pain Actually worsened New MRI reveals L3 and L5 fx Now scheduled for KP/VP at these levels Risks and Benefits discussed with the patient including, but not limited to education regarding the natural healing process of compression fractures without intervention, bleeding, infection, cement migration which may cause spinal cord damage, paralysis, pulmonary embolism or even death. All of the patient's questions were answered, patient is agreeable to proceed. Consent signed and in chart.    Thank you for this interesting consult.  I greatly enjoyed meeting EDANA AGUADO and look forward to participating in their care.  A copy of this report was sent to the requesting provider on this date.  Signed: Abanoub Hanken A 03/31/2015, 12:28 PM   I spent a total of  30 Minutes   in face to face in clinical consultation, greater than 50% of which was counseling/coordinating care for L3/L5 KP/VP

## 2015-03-31 NOTE — Progress Notes (Signed)
Pt ambulated to the bathroom at the end of her bedrest. Pt c/o left foot numbness, pt states it feels like needles on top of her left foot from great toe back to top of mid foot,  Pt has DP pulses 2+ bilateral, no edema or color changes in the feet.  Dr Estanislado Pandy was called, he suggested motrin 200 mg 3xdaily for 3 days. Pt is to ambulate tomorrow.  Pt was informed and verbalized understanding.

## 2015-04-04 ENCOUNTER — Other Ambulatory Visit: Payer: Self-pay | Admitting: Physical Medicine and Rehabilitation

## 2015-04-04 DIAGNOSIS — IMO0002 Reserved for concepts with insufficient information to code with codable children: Secondary | ICD-10-CM

## 2015-04-04 DIAGNOSIS — R229 Localized swelling, mass and lump, unspecified: Principal | ICD-10-CM

## 2015-04-05 ENCOUNTER — Other Ambulatory Visit (HOSPITAL_COMMUNITY): Payer: Self-pay | Admitting: Interventional Radiology

## 2015-04-05 DIAGNOSIS — S32030A Wedge compression fracture of third lumbar vertebra, initial encounter for closed fracture: Secondary | ICD-10-CM

## 2015-04-05 DIAGNOSIS — S32050A Wedge compression fracture of fifth lumbar vertebra, initial encounter for closed fracture: Secondary | ICD-10-CM

## 2015-04-17 ENCOUNTER — Ambulatory Visit
Admission: RE | Admit: 2015-04-17 | Discharge: 2015-04-17 | Disposition: A | Discharge status: Home / Self Care | Payer: Federal, State, Local not specified - PPO | Source: Ambulatory Visit | Attending: Physical Medicine and Rehabilitation | Admitting: Physical Medicine and Rehabilitation

## 2015-04-17 ENCOUNTER — Ambulatory Visit
Admission: RE | Admit: 2015-04-17 | Discharge: 2015-04-17 | Disposition: A | Discharge status: Home / Self Care | Payer: Medicare Other | Source: Ambulatory Visit | Attending: Physical Medicine and Rehabilitation | Admitting: Physical Medicine and Rehabilitation

## 2015-04-17 ENCOUNTER — Ambulatory Visit (HOSPITAL_COMMUNITY)
Admission: RE | Admit: 2015-04-17 | Discharge: 2015-04-17 | Disposition: A | Discharge status: Home / Self Care | Payer: Medicare Other | Source: Ambulatory Visit | Attending: Interventional Radiology | Admitting: Interventional Radiology

## 2015-04-17 DIAGNOSIS — R229 Localized swelling, mass and lump, unspecified: Principal | ICD-10-CM

## 2015-04-17 DIAGNOSIS — S32030A Wedge compression fracture of third lumbar vertebra, initial encounter for closed fracture: Secondary | ICD-10-CM

## 2015-04-17 DIAGNOSIS — S32050A Wedge compression fracture of fifth lumbar vertebra, initial encounter for closed fracture: Secondary | ICD-10-CM | POA: Diagnosis not present

## 2015-04-17 DIAGNOSIS — N838 Other noninflammatory disorders of ovary, fallopian tube and broad ligament: Secondary | ICD-10-CM | POA: Diagnosis not present

## 2015-04-17 DIAGNOSIS — IMO0002 Reserved for concepts with insufficient information to code with codable children: Secondary | ICD-10-CM

## 2015-05-02 DIAGNOSIS — E559 Vitamin D deficiency, unspecified: Secondary | ICD-10-CM | POA: Diagnosis not present

## 2015-05-02 DIAGNOSIS — E785 Hyperlipidemia, unspecified: Secondary | ICD-10-CM | POA: Diagnosis not present

## 2015-05-02 DIAGNOSIS — M8580 Other specified disorders of bone density and structure, unspecified site: Secondary | ICD-10-CM | POA: Diagnosis not present

## 2015-05-02 DIAGNOSIS — B002 Herpesviral gingivostomatitis and pharyngotonsillitis: Secondary | ICD-10-CM | POA: Diagnosis not present

## 2015-05-02 DIAGNOSIS — E038 Other specified hypothyroidism: Secondary | ICD-10-CM | POA: Diagnosis not present

## 2015-05-02 DIAGNOSIS — E039 Hypothyroidism, unspecified: Secondary | ICD-10-CM | POA: Diagnosis not present

## 2015-05-02 DIAGNOSIS — E784 Other hyperlipidemia: Secondary | ICD-10-CM | POA: Diagnosis not present

## 2015-05-12 DIAGNOSIS — M5136 Other intervertebral disc degeneration, lumbar region: Secondary | ICD-10-CM | POA: Diagnosis not present

## 2015-05-24 DIAGNOSIS — M5136 Other intervertebral disc degeneration, lumbar region: Secondary | ICD-10-CM | POA: Diagnosis not present

## 2015-06-01 DIAGNOSIS — B029 Zoster without complications: Secondary | ICD-10-CM | POA: Diagnosis not present

## 2015-06-15 ENCOUNTER — Other Ambulatory Visit: Payer: Self-pay | Admitting: Gynecology

## 2015-06-15 ENCOUNTER — Other Ambulatory Visit (HOSPITAL_COMMUNITY)
Admission: RE | Admit: 2015-06-15 | Discharge: 2015-06-15 | Disposition: A | Discharge status: Home / Self Care | Payer: Medicare Other | Source: Ambulatory Visit | Attending: Gynecology | Admitting: Gynecology

## 2015-06-15 ENCOUNTER — Ambulatory Visit (INDEPENDENT_AMBULATORY_CARE_PROVIDER_SITE_OTHER): Payer: Medicare Other

## 2015-06-15 ENCOUNTER — Ambulatory Visit (INDEPENDENT_AMBULATORY_CARE_PROVIDER_SITE_OTHER): Payer: Medicare Other | Admitting: Gynecology

## 2015-06-15 ENCOUNTER — Encounter: Payer: Self-pay | Admitting: Gynecology

## 2015-06-15 VITALS — BP 126/78 | Ht 65.0 in | Wt 195.0 lb

## 2015-06-15 DIAGNOSIS — N9489 Other specified conditions associated with female genital organs and menstrual cycle: Secondary | ICD-10-CM | POA: Diagnosis not present

## 2015-06-15 DIAGNOSIS — M810 Age-related osteoporosis without current pathological fracture: Secondary | ICD-10-CM | POA: Diagnosis not present

## 2015-06-15 DIAGNOSIS — R1900 Intra-abdominal and pelvic swelling, mass and lump, unspecified site: Secondary | ICD-10-CM

## 2015-06-15 DIAGNOSIS — Z01419 Encounter for gynecological examination (general) (routine) without abnormal findings: Secondary | ICD-10-CM | POA: Diagnosis not present

## 2015-06-15 DIAGNOSIS — Z1151 Encounter for screening for human papillomavirus (HPV): Secondary | ICD-10-CM | POA: Diagnosis not present

## 2015-06-15 DIAGNOSIS — N904 Leukoplakia of vulva: Secondary | ICD-10-CM | POA: Diagnosis not present

## 2015-06-15 MED ORDER — CLOBETASOL PROPIONATE 0.05 % EX CREA: 1.0000 "application " | TOPICAL_CREAM | Freq: Two times a day (BID) | CUTANEOUS | Status: DC

## 2015-06-15 NOTE — Patient Instructions (Signed)
Lichen Sclerosus Lichen sclerosus is a skin problem. It can happen on any part of the body, but it commonly involves the anal or genital areas. It can cause itching and discomfort in these areas. Treatment can help to control symptoms. When the genital area is affected, getting treatment is important because the condition can cause scarring that may lead to other problems. CAUSES The cause of this condition is not known. It could be the result of an overactive immune system or a lack of certain hormones. Lichen sclerosus is not an infection or a fungus. It is not passed from one person to another (not contagious). RISK FACTORS This condition is more likely to develop in women, usually after menopause. SYMPTOMS Symptoms of this condition include:  Thin, wrinkled, white areas on the skin.  Thickened white areas on the skin.  Red and swollen patches (lesions) on the skin.  Tears or cracks in the skin.  Bruising.  Blood blisters.  Severe itching. You may also have pain, itching, or burning with urination. Constipation is also common in people with lichen sclerosus. DIAGNOSIS This condition may be diagnosed with a physical exam. In some cases, a tissue sample (biopsy sample) may be removed to be looked at under a microscope. TREATMENT This condition is usually treated with medicated creams or ointments (topical steroids) that are applied over the affected areas. HOME CARE INSTRUCTIONS  Take over-the-counter and prescription medicines only as told by your health care provider.  Use creams or ointments as told by your health care provider.  Do not scratch the affected areas of skin.  Women should keep the vaginal area as clean and dry as possible.  Keep all follow-up visits as told by your health care provider. This is important. SEEK MEDICAL CARE IF:  You have increasing redness, swelling, or pain in the affected area.  You have fluid, blood, or pus coming from the affected  area.  You have new lesions on your skin.  You have pain or burning with urination.  You have pain during sex.   This information is not intended to replace advice given to you by your health care provider. Make sure you discuss any questions you have with your health care provider.   Document Released: 12/05/2010 Document Revised: 04/05/2015 Document Reviewed: 10/10/2014 Elsevier Interactive Patient Education 2016 [employer]  

## 2015-06-15 NOTE — Progress Notes (Signed)
Emma Lewis August 06, 1950 [id number]   History:    64 y.o.  for annual gyn exam who is a new patient to the practice. Patient brought with her her recent ultrasound as well as bone density study. Patient has been followed and treated by her orthopedic as a result of her history of  a closed wedge  compression fracture of the fifth lumbar vertebra. During the  evaluation when they did an MRI there was a report of a possible adnexal mass and patient subsequently was sent for an ultrasound which was done on  04/17/2015 at [facility] imaging and the report was as follows:    "uterus measures 7.7 x 2.5 x 4.5 cm with endometrial stripe of 1.7 mm left ovary was normal , right ovary they described as difficult to visualize measuring 5 x 5.1 x 4.5 cm complex right adnexal mass with focal areas of increased density in shadowing. Given MRI characteristic this is most likely a dermoid but no ovarian malignancy could not be excluded "  Patient has had issues at times with lower abdominal discomfort and loose stools several times during the day. She is currently on Fosamax as a result of her spinal fracture although her bone density study in 2016 had demonstrated that her AP spine and right hip lowest T score was -1.4 with a normal Frax analysis. Patient stated she had a normal colonoscopy in [place] in 2015. She also states that she is always had normal Pap smears. Patient has never been on any hormone replacement therapy in the past and no prior gynecological surgery and only one vaginal delivery. Dr. Karlton Lemon is her PCP who has  been doing her blood work and she states her vaccines are up-to-date.  Past medical history,surgical history, family history and social history were all reviewed and documented in the EPIC chart.  Gynecologic History No LMP recorded. Patient is postmenopausal. Contraception: post menopausal status Last Pap:  Several years ago. Results were: normal Last mammogram:  2016.  Results were: normal  Obstetric History OB History  Gravida Para Term Preterm AB SAB TAB Ectopic Multiple Living  1    0    0 1    # Outcome Date GA Lbr Len/2nd Weight Sex Delivery Anes PTL Lv  1 Gravida                ROS: A ROS was performed and pertinent positives and negatives are included in the history.  GENERAL: No fevers or chills. HEENT: No change in vision, no earache, sore throat or sinus congestion. NECK: No pain or stiffness. CARDIOVASCULAR: No chest pain or pressure. No palpitations. PULMONARY: No shortness of breath, cough or wheeze. GASTROINTESTINAL: No abdominal pain, nausea, vomiting or diarrhea, melena or bright red blood per rectum. GENITOURINARY: No urinary frequency, urgency, hesitancy or dysuria. MUSCULOSKELETAL: No joint or muscle pain, no back pain, no recent trauma. DERMATOLOGIC: No rash, no itching, no lesions. ENDOCRINE: No polyuria, polydipsia, no heat or cold intolerance. No recent change in weight. HEMATOLOGICAL: No anemia or easy bruising or bleeding. NEUROLOGIC: No headache, seizures, numbness, tingling or weakness. PSYCHIATRIC: No depression, no loss of interest in normal activity or change in sleep pattern.     Exam: chaperone present  BP 120/80 mmHg  Ht 5\' 5"  (1.651 m)  Wt 195 lb (88.451 kg)  BMI 32.45 kg/m2  Body mass index is 32.45 kg/(m^2).  General appearance : Well developed well nourished female. No acute distress HEENT: Eyes: no  retinal hemorrhage or exudates,  Neck supple, trachea midline, no carotid bruits, no thyroidmegaly Lungs: Clear to auscultation, no rhonchi or wheezes, or rib retractions  Heart: Regular rate and rhythm, no murmurs or gallops Breast:Examined in sitting and supine position were symmetrical in appearance, no palpable masses or tenderness,  no skin retraction, no nipple inversion, no nipple discharge, no skin discoloration, no axillary or supraclavicular lymphadenopathy Abdomen: no palpable masses or tenderness, no  rebound or guarding Extremities: no edema or skin discoloration or tenderness  Pelvic:  Bartholin, Urethra, Skene Glands: Within normal limits             Vagina: No gross lesions or discharge  Cervix: No gross lesions or discharge  Uterus   anteverted, normal size, shape and consistency, non-tender and mobile  Adnexa   Right adnexal fullness nontender  Anus and perineum  normal   Rectovaginal  normal sphincter tone without palpated masses or tenderness             Hemoccult  Not done    pelvic ultrasound today: Uterus measures 5.9 x 4.6 x 3.4 cm with endometrial stripe of 6.8 mm. Calcified fibroid intramural measuring 15 x 19 x 18 mm with prominent color flow Doppler to the area. A right adnexal cystic solid mass with echogenic posterior wall shadowing noted with a dimension of 8.3 x 6.0 x 4.3 cm average size 6.2 cm with positive color flow Doppler arterial blood flow seen in the mass. Left ovary was normal no fluid in the cul-de-sac.   Assessment/Plan:  64 y.o. female for annual exam  With recently diagnosed right adnexal mass that time of evaluation for her spinal fracture. Ultrasound findings as described above. The right ovarian mass highly suspicious for malignancy rapid growth In the course of the past 2 months when both ultrasounds were compared. We'll make arrangements for patient to be seen in consultation with a GYN oncologist we will forward these results and await the recommendation. For patient's apparent lichen sclerosus she will be prescribed clobetasol 0.05% cream to apply to times a day for 14 days then twice a week. I would recommend that she return back in 3 months for follow-up in the event that it hasn't cleared we will definitely need to have a tissue diagnosis with a biopsy. Patient's Pap smear was done today as well. I'm also going to recommend her PCP Dr. Karlton Lemon to DC her Fosamax sent it is causing her diarrhea since she started 6 weeks ago and consider Prolia 60 mg  subcutaneous 6 months As treatment of her osteoporosis. Meanwhile patient should continue her calcium and vitamin D.   Terrance Mass MD, 12:04 PM 06/15/2015

## 2015-06-16 ENCOUNTER — Telehealth: Payer: Self-pay | Admitting: *Deleted

## 2015-06-16 NOTE — Telephone Encounter (Signed)
-----   Message from Terrance Mass, MD sent at 06/15/2015  2:52 PM EST ----- Anderson Malta, please make an appointment for this patient with Dr. Denman George GYN oncologist due to large adnexal mass. Please schedule 2 weeks from today

## 2015-06-16 NOTE — Telephone Encounter (Signed)
Appointment on 07/06/15 @ 12:15pm with Dr.Rossi, pt aware her PCP is Dr.Kimberly Karlton Lemon [phone] notes will be faxed to her as well.

## 2015-06-19 LAB — CYTOLOGY - PAP

## 2015-06-21 DIAGNOSIS — Z96642 Presence of left artificial hip joint: Secondary | ICD-10-CM | POA: Diagnosis not present

## 2015-06-24 LAB — OVARIAN MALIGNANCY RISK-ROMA
CA125: 19 U/mL (ref <35)
HE4: 77 pmol (ref <151)
ROMA PREMENOPAUSAL: 1.86 — AB (ref <1.31)
ROMA Postmenopausal: 1.95 (ref <2.77)

## 2015-07-03 DIAGNOSIS — Z6839 Body mass index (BMI) 39.0-39.9, adult: Secondary | ICD-10-CM | POA: Diagnosis not present

## 2015-07-03 DIAGNOSIS — N858 Other specified noninflammatory disorders of uterus: Secondary | ICD-10-CM | POA: Diagnosis not present

## 2015-07-03 DIAGNOSIS — R197 Diarrhea, unspecified: Secondary | ICD-10-CM | POA: Diagnosis not present

## 2015-07-03 DIAGNOSIS — M8580 Other specified disorders of bone density and structure, unspecified site: Secondary | ICD-10-CM | POA: Diagnosis not present

## 2015-07-06 ENCOUNTER — Ambulatory Visit: Payer: Medicare Other | Attending: Gynecologic Oncology | Admitting: Gynecologic Oncology

## 2015-07-06 ENCOUNTER — Encounter: Payer: Self-pay | Admitting: Gynecologic Oncology

## 2015-07-06 VITALS — BP 111/63 | HR 84 | Temp 97.9°F | Resp 18 | Ht 65.0 in | Wt 190.7 lb

## 2015-07-06 DIAGNOSIS — N839 Noninflammatory disorder of ovary, fallopian tube and broad ligament, unspecified: Secondary | ICD-10-CM

## 2015-07-06 DIAGNOSIS — R1900 Intra-abdominal and pelvic swelling, mass and lump, unspecified site: Secondary | ICD-10-CM | POA: Insufficient documentation

## 2015-07-06 NOTE — Patient Instructions (Signed)
Preparing for your Surgery  Plan for surgery on August 01, 2015 with Dr. Everitt Amber. You are scheduled for a robotic assisted bilateral salpingo oophorectomy (removal of fallopian tubes and ovaries), possible hysterectomy.  Pre-operative Testing -You will receive a phone call from presurgical testing at [facility] to arrange for a pre-operative testing appointment before your surgery.  This appointment normally occurs one to two weeks before your scheduled surgery.   -Bring your insurance card, copy of an advanced directive if applicable, medication list  -At that visit, you will be asked to sign a consent for a possible blood transfusion in case a transfusion becomes necessary during surgery.  The need for a blood transfusion is rare but having consent is a necessary part of your care.     -You should not be taking blood thinners or aspirin at least ten days prior to surgery unless instructed by your surgeon.  Day Before Surgery at [place] will be asked to take in only clear liquids the day before surgery.  Examples of clear liquids include broths, jello, and clear juices.  Avoid carbonated beverages.  You will be advised to have nothing to eat or drink after midnight the evening before.    Your role in recovery Your role is to become active as soon as directed by your doctor, while still giving yourself time to heal.  Rest when you feel tired. You will be asked to do the following in order to speed your recovery:  - Cough and breathe deeply. This helps toclear and expand your lungs and can prevent pneumonia. You may be given a spirometer to practice deep breathing. A staff member will show you how to use the spirometer. - Do mild physical activity. Walking or moving your legs help your circulation and body functions return to normal. A staff member will help you when you try to walk and will provide you with simple exercises. Do not try to get up or walk alone the  first time. - Actively manage your pain. Managing your pain lets you move in comfort. We will ask you to rate your pain on a scale of zero to 10. It is your responsibility to tell your doctor or nurse where and how much you hurt so your pain can be treated.  Special Considerations -If you are diabetic, you may be placed on insulin after surgery to have closer control over your blood sugars to promote healing and recovery.  This does not mean that you will be discharged on insulin.  If applicable, your oral antidiabetics will be resumed when you are tolerating a solid diet.  -Your final pathology results from surgery should be available by the Friday after surgery and the results will be relayed to you when available.  Blood Transfusion Information WHAT IS A BLOOD TRANSFUSION? A transfusion is the replacement of blood or some of its parts. Blood is made up of multiple cells which provide different functions.  Red blood cells carry oxygen and are used for blood loss replacement.  White blood cells fight against infection.  Platelets control bleeding.  Plasma helps clot blood.  Other blood products are available for specialized needs, such as hemophilia or other clotting disorders. BEFORE THE TRANSFUSION  Who gives blood for transfusions?   You may be able to donate blood to be used at a later date on yourself (autologous donation).  Relatives can be asked to donate blood. This is generally not any safer than if you have received  blood from a stranger. The same precautions are taken to ensure safety when a relative's blood is donated.  Healthy volunteers who are fully evaluated to make sure their blood is safe. This is blood bank blood. Transfusion therapy is the safest it has ever been in the practice of medicine. Before blood is taken from a donor, a complete history is taken to make sure that person has no history of diseases nor engages in risky social behavior (examples are intravenous  drug use or sexual activity with multiple partners). The donor's travel history is screened to minimize risk of transmitting infections, such as malaria. The donated blood is tested for signs of infectious diseases, such as HIV and hepatitis. The blood is then tested to be sure it is compatible with you in order to minimize the chance of a transfusion reaction. If you or a relative donates blood, this is often done in anticipation of surgery and is not appropriate for emergency situations. It takes many days to process the donated blood. RISKS AND COMPLICATIONS Although transfusion therapy is very safe and saves many lives, the main dangers of transfusion include:   Getting an infectious disease.  Developing a transfusion reaction. This is an allergic reaction to something in the blood you were given. Every precaution is taken to prevent this. The decision to have a blood transfusion has been considered carefully by your caregiver before blood is given. Blood is not given unless the benefits outweigh the risks.

## 2015-07-06 NOTE — Progress Notes (Signed)
Consult Note: Gyn-Onc  Consult was requested by Dr. Toney Rakes for the evaluation of NAIYANA TIBBETT 64 y.o. female with a 8cm right ovarian complex mass  CC:  Chief Complaint  Patient presents with  . Pelvic mass    New consult    Assessment/Plan:  Ms. YASENIA RIZZUTO  is a 64 y.o.  year old with a 8cm right ovarian complex mass. The mass is overall reassuring in appearance and is associated with normal tumor markers, however, given its increase in size between the September and November US's, and her postmenopausal age and its complexity on imaging, I believe it is reasonable for surgical removal for definitive histology, particularly as this patient is at average surgical risk.  I discussed that the recommended surgery would be robotic assisted BSO, possible hysterectomy, possible staging (pending frozen section results). I discussed surgical risks including   bleeding, infection, damage to internal organs (such as bladder,ureters, bowels), blood clot, reoperation and rehospitalization. I discussed anticipated hospital stay (outpatient) and recovery.   HPI: Chain Gittleman is a 64 year old woman who is seen in consultation at the request of Dr. Toney Rakes for an enlarging complex right ovarian cyst. The patient reports having an MRI of the spine in September 2016 as she has a history of lumbar compression fractures. Adnexal mass was detected on this MRI and to better characterize it she was then sent for an ultrasound scan in [facility] imaging which demonstrated a normal uterus measuring 7.7 x 2.5 x 4.5 cm with an endometrial stripe of 1.7 mm, a left ovary that was normal, and a right ovary measuring 5.1 by 5 x 4.5 cm with a complex right adnexal mass and focal areas of increased density and shadowing. The patient was then seen by Dr. Toney Rakes in June 15, 2015 and a follow-up ultrasound scan was performed this confirmed now a 8.3 x 6 x 4.3 cm right adnexal cystic and solid mass with an echogenic  posterior wall shadowing noted. There was positive color-flow Doppler seen in the mass. The left ovary was normal. As was the uterus. She had several tumor markers drawn including a CA-125 which was 19, and 84 which was normal at 77, and a normal postmenopausal from the scan. These labs were drawn on 06/15/2015.   The patient has had no prior abdominal surgeries. She is overweight with a BMI of 31kg/m2.  Interval History: She denies symptoms of bloating, pelvic pain, early satiety. She denies postmenopausal bleeding.  Current Meds:  Outpatient Encounter Prescriptions as of 07/06/2015  Medication Sig  . acyclovir (ZOVIRAX) 400 MG tablet Take 400 mg by mouth daily.  . Artificial Tear Solution (SOOTHE XP) SOLN Place 1 drop into both eyes daily as needed (dry eyes).  . Calcium Carb-Cholecalciferol (CALCIUM 600 + D PO) Take 1 tablet by mouth daily.  . cholecalciferol (VITAMIN D) 1000 UNITS tablet Take 1,000 Units by mouth 3 (three) times daily.  . clobetasol cream (TEMOVATE) [id number] % Apply 1 application topically 2 (two) times daily. Apply twice a day for two weeks then twice a week  . Famotidine (PEPCID PO) Take by mouth as needed.  Marland Kitchen ibuprofen (ADVIL,MOTRIN) 200 MG tablet Take 800 mg by mouth every 6 (six) hours as needed (for pain).  Marland Kitchen levothyroxine (SYNTHROID, LEVOTHROID) 100 MCG tablet Take 100 mcg by mouth daily before breakfast.  . MAGNESIUM PO Take 1 tablet by mouth 3 (three) times daily.  . Omega-3 Fatty Acids (FISH OIL) 1200 MG CAPS Take 1 capsule by mouth 3 (  three) times daily.  . Vitamin Mixture (ESTER-C PO) Take 1 tablet by mouth 3 (three) times daily.  Marland Kitchen HYDROcodone-acetaminophen (NORCO/VICODIN) 5-325 MG per tablet Take 1 tablet by mouth every 6 (six) hours as needed for moderate pain.  . phentermine (ADIPEX-P) 37.5 MG tablet Take 37.5 mg by mouth daily before breakfast.  . [DISCONTINUED] alendronate (FOSAMAX) 70 MG tablet Take 70 mg by mouth once a week. Take with a full glass of water  on an empty stomach.   No facility-administered encounter medications on file as of 07/06/2015.    Allergy:  Allergies  Allergen Reactions  . Sulfa Antibiotics Other (See Comments)    Mother said "I liked to have died"  . Sulfur Other (See Comments)    Pt stated my mother told me "I almost died"    Social Hx:   Social History   Social History  . Marital Status: Married    Spouse Name: N/A  . Number of Children: N/A  . Years of Education: N/A   Occupational History  . Not on file.   Social History Main Topics  . Smoking status: Never Smoker   . Smokeless tobacco: Never Used  . Alcohol Use: Yes     Comment: rare  . Drug Use: No  . Sexual Activity: Not on file   Other Topics Concern  . Not on file   Social History Narrative    Past Surgical Hx:  Past Surgical History  Procedure Laterality Date  . Ganglion cyst excision Left 1970s    wrist  . Knee surgery Right   . Shoulder open rotator cuff repair Right   . Shoulder surgery Right   . Joint replacement    . Hip arthroplasty Left   . Colonoscopy    . Anterior cervical decomp/discectomy fusion N/A 02/09/2014    Procedure: ANTERIOR CERVICAL DECOMPRESSION/DISCECTOMY FUSION 2 LEVELS;  Surgeon: [name], MD;  Location: [place];  Service: Orthopedics;  Laterality: N/A;  C4-5, C5-6 Anterior Cervical Discectomy and Fusion, Allograft, Plate    Past Medical Hx:  Past Medical History  Diagnosis Date  . PONV (postoperative nausea and vomiting)   . Bronchitis     hx of  . Pneumonia     hx of  . Hypothyroidism   . Arthritis   . Seasonal allergies   . Fever blister     Takes Acyclovir  . Hyperlipemia   . GERD (gastroesophageal reflux disease)     Past Gynecological History:   No LMP recorded. Patient is postmenopausal.  Family Hx:  Family History  Problem Relation Age of Onset  . Lymphoma Mother     Review of Systems:  Constitutional  Feels well,    ENT Normal appearing ears and nares  bilaterally Skin/Breast  No rash, sores, jaundice, itching, dryness Cardiovascular  No chest pain, shortness of breath, or edema  Pulmonary  No cough or wheeze.  Gastro Intestinal  No nausea, vomitting, or diarrhoea. No bright red blood per rectum, no abdominal pain, change in bowel movement, or constipation.  Genito Urinary  No frequency, urgency, dysuria,  Musculo Skeletal  No myalgia, arthralgia, joint swelling or pain  Neurologic  No weakness, numbness, change in gait,  Psychology  No depression, anxiety, insomnia.   Vitals:  Blood pressure 111/63, pulse 84, temperature 97.9 F (36.6 C), temperature source Oral, resp. rate 18, height 5\' 5"  (1.651 m), weight 190 lb 11.2 oz (86.501 kg), SpO2 98 %.  Physical Exam: WD in NAD Neck  Supple NROM, without any enlargements.  Lymph Node Survey No cervical supraclavicular or inguinal adenopathy Cardiovascular  Pulse normal rate, regularity and rhythm. S1 and S2 normal.  Lungs  Clear to auscultation bilateraly, without wheezes/crackles/rhonchi. Good air movement.  Skin  No rash/lesions/breakdown  Psychiatry  Alert and oriented to person, place, and time  Abdomen  Normoactive bowel sounds, abdomen soft, non-tender and overweigh without evidence of hernia.  Back No CVA tenderness Genito Urinary  Vulva/vagina: Normal external female genitalia.  No lesions. No discharge or bleeding.  Bladder/urethra:  No lesions or masses, well supported bladder  Vagina: grossly normal  Cervix: Normal appearing, no lesions.  Uterus:  Small, mobile, no parametrial involvement or nodularity.  Adnexa: fullness, smooth in the right adnexa, mobile.  Rectal  Good tone, no masses no cul de sac nodularity.  Extremities  No bilateral cyanosis, clubbing or edema.   Donaciano Eva, MD  07/06/2015, 5:33 PM

## 2015-07-20 ENCOUNTER — Encounter (HOSPITAL_COMMUNITY): Payer: Self-pay

## 2015-07-20 NOTE — Patient Instructions (Addendum)
YOUR PROCEDURE IS SCHEDULED ON : 08/01/15  REPORT TO [place] MAIN ENTRANCE FOLLOW SIGNS TO EAST ELEVATOR - GO TO 3rd FLOOR CHECK IN AT 3 EAST NURSES STATION (SHORT STAY) AT:  9:00 AM  CALL THIS NUMBER IF YOU HAVE PROBLEMS THE MORNING OF SURGERY [phone]  REMEMBER:ONLY 1 PER PERSON MAY GO TO SHORT STAY WITH YOU TO GET READY THE MORNING OF YOUR SURGERY  DO NOT EAT FOOD OR DRINK LIQUIDS AFTER MIDNIGHT  TAKE THESE MEDICINES THE MORNING OF SURGERY: ZYRTEC / LEVOTHYROXINE / OMEPRAZOLE / HYDROCODONE IF NEEDED  CLEAR LIQUIDS ONLY ON THE DAY BEFORE SURGERY  CLEAR LIQUID DIET  Foods Allowed                                                                     Foods Excluded  Coffee and tea, regular and decaf                             liquids that you cannot  Plain Jell-O in any flavor                                             see through such as: Fruit ices (not with fruit pulp)                                     milk, soups, orange juice  Iced Popsicles                                                       All solid food Carbonated beverages, regular and diet                                    Cranberry, grape and apple juices Sports drinks like Gatorade Lightly seasoned clear broth or consume(fat free) Sugar, honey syrup   _____________________________________________________________________    YOU MAY NOT HAVE ANY METAL ON YOUR BODY INCLUDING HAIR PINS AND PIERCING'S. DO NOT WEAR JEWELRY, MAKEUP, LOTIONS, POWDERS OR PERFUMES. DO NOT WEAR NAIL POLISH. DO NOT SHAVE 48 HRS PRIOR TO SURGERY. MEN MAY SHAVE FACE AND NECK.  DO NOT [place]. [facility] IS NOT RESPONSIBLE FOR VALUABLES.  CONTACTS, DENTURES OR PARTIALS MAY NOT BE WORN TO SURGERY. LEAVE SUITCASE IN CAR. CAN BE BROUGHT TO ROOM AFTER SURGERY.  PATIENTS DISCHARGED THE DAY OF SURGERY WILL NOT BE ALLOWED TO DRIVE HOME.  PLEASE READ OVER THE FOLLOWING INSTRUCTION  SHEETS _________________________________________________________________________________                                          [place]  Before surgery, you can play an important role.  Because skin is not sterile, your skin needs to be as free of germs as possible.  You can reduce the number of germs on your skin by washing with CHG (chlorahexidine gluconate) soap before surgery.  CHG is an antiseptic cleaner which kills germs and bonds with the skin to continue killing germs even after washing. Please DO NOT use if you have an allergy to CHG or antibacterial soaps.  If your skin becomes reddened/irritated stop using the CHG and inform your nurse when you arrive at Short Stay. Do not shave (including legs and underarms) for at least 48 hours prior to the first CHG shower.  You may shave your face. Please follow these instructions carefully:   1.  Shower with CHG Soap the night before surgery and the  morning of Surgery.   2.  If you choose to wash your hair, wash your hair first as usual with your  normal  Shampoo.   3.  After you shampoo, rinse your hair and body thoroughly to remove the  shampoo.                                         4.  Use CHG as you would any other liquid soap.  You can apply chg directly  to the skin and wash . Gently wash with scrungie or clean wascloth    5.  Apply the CHG Soap to your body ONLY FROM THE NECK DOWN.   Do not use on open                           Wound or open sores. Avoid contact with eyes, ears mouth and genitals (private parts).                        Genitals (private parts) with your normal soap.              6.  Wash thoroughly, paying special attention to the area where your surgery  will be performed.   7.  Thoroughly rinse your body with warm water from the neck down.   8.  DO NOT shower/wash with your normal soap after using and rinsing off  the CHG Soap .                9.  Pat yourself dry with a clean  towel.             10.  Wear clean night clothes to bed after shower             11.  Place clean sheets on your bed the night of your first shower and do not  sleep with pets.  Day of Surgery : Do not apply any lotions/deodorants the morning of surgery.  Please wear clean clothes to the hospital/surgery center.  FAILURE TO FOLLOW THESE INSTRUCTIONS MAY RESULT IN THE CANCELLATION OF YOUR SURGERY    PATIENT SIGNATURE_________________________________  ______________________________________________________________________     Adam Phenix  An incentive spirometer is a tool that can help keep your lungs clear and active. This tool measures how well you are filling your lungs with each breath. Taking long deep breaths may help reverse or decrease the chance of developing breathing (pulmonary) problems (especially infection) following:  A long  period of time when you are unable to move or be active. BEFORE THE PROCEDURE   If the spirometer includes an indicator to show your best effort, your nurse or respiratory therapist will set it to a desired goal.  If possible, sit up straight or lean slightly forward. Try not to slouch.  Hold the incentive spirometer in an upright position. INSTRUCTIONS FOR USE   Sit on the edge of your bed if possible, or sit up as far as you can in bed or on a chair.  Hold the incentive spirometer in an upright position.  Breathe out normally.  Place the mouthpiece in your mouth and seal your lips tightly around it.  Breathe in slowly and as deeply as possible, raising the piston or the ball toward the top of the column.  Hold your breath for 3-5 seconds or for as long as possible. Allow the piston or ball to fall to the bottom of the column.  Remove the mouthpiece from your mouth and breathe out normally.  Rest for a few seconds and repeat Steps 1 through 7 at least 10 times every 1-2 hours when you are awake. Take your time and take a few  normal breaths between deep breaths.  The spirometer may include an indicator to show your best effort. Use the indicator as a goal to work toward during each repetition.  After each set of 10 deep breaths, practice coughing to be sure your lungs are clear. If you have an incision (the cut made at the time of surgery), support your incision when coughing by placing a pillow or rolled up towels firmly against it. Once you are able to get out of bed, walk around indoors and cough well. You may stop using the incentive spirometer when instructed by your caregiver.  RISKS AND COMPLICATIONS  Take your time so you do not get dizzy or light-headed.  If you are in pain, you may need to take or ask for pain medication before doing incentive spirometry. It is harder to take a deep breath if you are having pain. AFTER USE  Rest and breathe slowly and easily.  It can be helpful to keep track of a log of your progress. Your caregiver can provide you with a simple table to help with this. If you are using the spirometer at home, follow these instructions: [place] IF:   You are having difficultly using the spirometer.  You have trouble using the spirometer as often as instructed.  Your pain medication is not giving enough relief while using the spirometer.  You develop fever of 100.5 F (38.1 C) or higher. SEEK IMMEDIATE MEDICAL CARE IF:   You cough up bloody sputum that had not been present before.  You develop fever of 102 F (38.9 C) or greater.  You develop worsening pain at or near the incision site. MAKE SURE YOU:   Understand these instructions.  Will watch your condition.  Will get help right away if you are not doing well or get worse. Document Released: 11/25/2006 Document Revised: 10/07/2011 Document Reviewed: 01/26/2007 Exit[employer] Patient Information 2014 [employer], [place].   ________________________________________________________________________  WHAT IS A BLOOD  TRANSFUSION? Blood Transfusion Information  A transfusion is the replacement of blood or some of its parts. Blood is made up of multiple cells which provide different functions.  Red blood cells carry oxygen and are used for blood loss replacement.  White blood cells fight against infection.  Platelets control bleeding.  Plasma helps  clot blood.  Other blood products are available for specialized needs, such as hemophilia or other clotting disorders. BEFORE THE TRANSFUSION  Who gives blood for transfusions?   Healthy volunteers who are fully evaluated to make sure their blood is safe. This is blood bank blood. Transfusion therapy is the safest it has ever been in the practice of medicine. Before blood is taken from a donor, a complete history is taken to make sure that person has no history of diseases nor engages in risky social behavior (examples are intravenous drug use or sexual activity with multiple partners). The donor's travel history is screened to minimize risk of transmitting infections, such as malaria. The donated blood is tested for signs of infectious diseases, such as HIV and hepatitis. The blood is then tested to be sure it is compatible with you in order to minimize the chance of a transfusion reaction. If you or a relative donates blood, this is often done in anticipation of surgery and is not appropriate for emergency situations. It takes many days to process the donated blood. RISKS AND COMPLICATIONS Although transfusion therapy is very safe and saves many lives, the main dangers of transfusion include:   Getting an infectious disease.  Developing a transfusion reaction. This is an allergic reaction to something in the blood you were given. Every precaution is taken to prevent this. The decision to have a blood transfusion has been considered carefully by your caregiver before blood is given. Blood is not given unless the benefits outweigh the risks. AFTER THE  TRANSFUSION  Right after receiving a blood transfusion, you will usually feel much better and more energetic. This is especially true if your red blood cells have gotten low (anemic). The transfusion raises the level of the red blood cells which carry oxygen, and this usually causes an energy increase.  The nurse administering the transfusion will monitor you carefully for complications. HOME CARE INSTRUCTIONS  No special instructions are needed after a transfusion. You may find your energy is better. Speak with your caregiver about any limitations on activity for underlying diseases you may have. SEEK MEDICAL CARE IF:   Your condition is not improving after your transfusion.  You develop redness or irritation at the intravenous (IV) site. SEEK IMMEDIATE MEDICAL CARE IF:  Any of the following symptoms occur over the next 12 hours:  Shaking chills.  You have a temperature by mouth above 102 F (38.9 C), not controlled by medicine.  Chest, back, or muscle pain.  People around you feel you are not acting correctly or are confused.  Shortness of breath or difficulty breathing.  Dizziness and fainting.  You get a rash or develop hives.  You have a decrease in urine output.  Your urine turns a dark color or changes to pink, red, or brown. Any of the following symptoms occur over the next 10 days:  You have a temperature by mouth above 102 F (38.9 C), not controlled by medicine.  Shortness of breath.  Weakness after normal activity.  The white part of the eye turns yellow (jaundice).  You have a decrease in the amount of urine or are urinating less often.  Your urine turns a dark color or changes to pink, red, or brown. Document Released: 07/12/2000 Document Revised: 10/07/2011 Document Reviewed: 02/29/2008 [facility] Patient Information 2014 [place], [place].  _______________________________________________________________________

## 2015-07-25 ENCOUNTER — Encounter (HOSPITAL_COMMUNITY): Payer: Self-pay

## 2015-07-25 ENCOUNTER — Encounter (HOSPITAL_COMMUNITY)
Admission: RE | Admit: 2015-07-25 | Discharge: 2015-07-25 | Disposition: A | Discharge status: Home / Self Care | Payer: Medicare Other | Source: Ambulatory Visit | Attending: Gynecologic Oncology | Admitting: Gynecologic Oncology

## 2015-07-25 DIAGNOSIS — Z01818 Encounter for other preprocedural examination: Secondary | ICD-10-CM | POA: Insufficient documentation

## 2015-07-25 DIAGNOSIS — R1909 Other intra-abdominal and pelvic swelling, mass and lump: Secondary | ICD-10-CM | POA: Insufficient documentation

## 2015-07-25 HISTORY — DX: Other specified disorders of bone density and structure, unspecified site: M85.80

## 2015-07-25 LAB — ABO/RH: ABO/RH(D): A POS

## 2015-07-25 LAB — BASIC METABOLIC PANEL
ANION GAP: 8 (ref 5–15)
BUN: 17 mg/dL (ref 6–20)
CO2: 27 mmol/L (ref 22–32)
Calcium: 10.1 mg/dL (ref 8.9–10.3)
Chloride: 106 mmol/L (ref 101–111)
Creatinine, Ser: 0.71 mg/dL (ref 0.44–1.00)
GFR calc Af Amer: >60 mL/min (ref >60)
GFR calc non Af Amer: >60 mL/min (ref >60)
GLUCOSE: 94 mg/dL (ref 65–99)
POTASSIUM: 4.7 mmol/L (ref 3.5–5.1)
Sodium: 141 mmol/L (ref 135–145)

## 2015-07-25 LAB — CBC
HEMATOCRIT: 43.3 % (ref 36.0–46.0)
HEMOGLOBIN: 14.2 g/dL (ref 12.0–15.0)
MCH: 31.7 pg (ref 26.0–34.0)
MCHC: 32.8 g/dL (ref 30.0–36.0)
MCV: 96.7 fL (ref 78.0–100.0)
Platelets: 270 10*3/uL (ref 150–400)
RBC: 4.48 MIL/uL (ref 3.87–5.11)
RDW: 13.5 % (ref 11.5–15.5)
WBC: 7.9 10*3/uL (ref 4.0–10.5)

## 2015-08-01 ENCOUNTER — Ambulatory Visit (HOSPITAL_COMMUNITY)
Admission: RE | Admit: 2015-08-01 | Discharge: 2015-08-01 | Disposition: A | Discharge status: Home / Self Care | Payer: Medicare Other | Source: Ambulatory Visit | Attending: Gynecologic Oncology | Admitting: Gynecologic Oncology

## 2015-08-01 ENCOUNTER — Encounter (HOSPITAL_COMMUNITY)
Admission: RE | Disposition: A | Discharge status: Home / Self Care | Payer: Self-pay | Source: Ambulatory Visit | Attending: Gynecologic Oncology

## 2015-08-01 ENCOUNTER — Encounter (HOSPITAL_COMMUNITY): Payer: Self-pay | Admitting: Gynecologic Oncology

## 2015-08-01 ENCOUNTER — Ambulatory Visit (HOSPITAL_COMMUNITY): Payer: Medicare Other | Admitting: Registered Nurse

## 2015-08-01 DIAGNOSIS — E039 Hypothyroidism, unspecified: Secondary | ICD-10-CM | POA: Diagnosis not present

## 2015-08-01 DIAGNOSIS — K219 Gastro-esophageal reflux disease without esophagitis: Secondary | ICD-10-CM | POA: Diagnosis not present

## 2015-08-01 DIAGNOSIS — Z96642 Presence of left artificial hip joint: Secondary | ICD-10-CM | POA: Insufficient documentation

## 2015-08-01 DIAGNOSIS — R1900 Intra-abdominal and pelvic swelling, mass and lump, unspecified site: Secondary | ICD-10-CM | POA: Diagnosis present

## 2015-08-01 DIAGNOSIS — Z6831 Body mass index (BMI) 31.0-31.9, adult: Secondary | ICD-10-CM | POA: Diagnosis not present

## 2015-08-01 DIAGNOSIS — Z882 Allergy status to sulfonamides status: Secondary | ICD-10-CM | POA: Diagnosis not present

## 2015-08-01 DIAGNOSIS — M1990 Unspecified osteoarthritis, unspecified site: Secondary | ICD-10-CM | POA: Insufficient documentation

## 2015-08-01 DIAGNOSIS — Z79899 Other long term (current) drug therapy: Secondary | ICD-10-CM | POA: Insufficient documentation

## 2015-08-01 DIAGNOSIS — E785 Hyperlipidemia, unspecified: Secondary | ICD-10-CM | POA: Diagnosis not present

## 2015-08-01 DIAGNOSIS — D270 Benign neoplasm of right ovary: Secondary | ICD-10-CM | POA: Diagnosis not present

## 2015-08-01 DIAGNOSIS — Z981 Arthrodesis status: Secondary | ICD-10-CM | POA: Insufficient documentation

## 2015-08-01 DIAGNOSIS — N83202 Unspecified ovarian cyst, left side: Secondary | ICD-10-CM | POA: Diagnosis not present

## 2015-08-01 DIAGNOSIS — N838 Other noninflammatory disorders of ovary, fallopian tube and broad ligament: Secondary | ICD-10-CM

## 2015-08-01 HISTORY — PX: ROBOTIC ASSISTED BILATERAL SALPINGO OOPHERECTOMY: SHX6078

## 2015-08-01 LAB — TYPE AND SCREEN
ABO/RH(D): A POS
Antibody Screen: NEGATIVE

## 2015-08-01 SURGERY — ROBOTIC ASSISTED BILATERAL SALPINGO OOPHORECTOMY
Anesthesia: General | Laterality: Bilateral

## 2015-08-01 MED ORDER — ROCURONIUM BROMIDE 100 MG/10ML IV SOLN
INTRAVENOUS | Status: DC | PRN
  Administered 2015-08-01: 45 mg via INTRAVENOUS
  Administered 2015-08-01: 5 mg via INTRAVENOUS

## 2015-08-01 MED ORDER — DIPHENHYDRAMINE HCL 50 MG/ML IJ SOLN
12.5000 mg | Freq: Once | INTRAMUSCULAR | Status: AC
  Administered 2015-08-01: 12.5 mg via INTRAVENOUS

## 2015-08-01 MED ORDER — LACTATED RINGERS IR SOLN
Status: DC | PRN
  Administered 2015-08-01: 1000 mL

## 2015-08-01 MED ORDER — PHENYLEPHRINE 40 MCG/ML (10ML) SYRINGE FOR IV PUSH (FOR BLOOD PRESSURE SUPPORT)
PREFILLED_SYRINGE | INTRAVENOUS | Status: AC
  Filled 2015-08-01: qty 10

## 2015-08-01 MED ORDER — LACTATED RINGERS IV SOLN
INTRAVENOUS | Status: DC | PRN
  Administered 2015-08-01 (×3): via INTRAVENOUS

## 2015-08-01 MED ORDER — GLYCOPYRROLATE 0.2 MG/ML IJ SOLN
INTRAMUSCULAR | Status: AC
  Filled 2015-08-01: qty 1

## 2015-08-01 MED ORDER — SODIUM CHLORIDE 0.9 % IJ SOLN: 3.0000 mL | Freq: Two times a day (BID) | INTRAMUSCULAR | Status: DC

## 2015-08-01 MED ORDER — ONDANSETRON HCL 4 MG/2ML IJ SOLN
INTRAMUSCULAR | Status: DC | PRN
  Administered 2015-08-01: 4 mg via INTRAVENOUS

## 2015-08-01 MED ORDER — NEOSTIGMINE METHYLSULFATE 10 MG/10ML IV SOLN
INTRAVENOUS | Status: DC | PRN
Start: 2015-08-01 — End: 2015-08-01
  Administered 2015-08-01: 3 mg via INTRAVENOUS

## 2015-08-01 MED ORDER — ONDANSETRON HCL 4 MG/2ML IJ SOLN
INTRAMUSCULAR | Status: AC
Start: 2015-08-01 — End: 2015-08-01
  Filled 2015-08-01: qty 2

## 2015-08-01 MED ORDER — SUFENTANIL CITRATE 50 MCG/ML IV SOLN
INTRAVENOUS | Status: AC
Start: 2015-08-01 — End: 2015-08-01
  Filled 2015-08-01: qty 1

## 2015-08-01 MED ORDER — GLYCOPYRROLATE 0.2 MG/ML IJ SOLN
INTRAMUSCULAR | Status: DC | PRN
  Administered 2015-08-01: .4 mg via INTRAVENOUS

## 2015-08-01 MED ORDER — PROPOFOL 10 MG/ML IV BOLUS
INTRAVENOUS | Status: AC
  Filled 2015-08-01: qty 20

## 2015-08-01 MED ORDER — GLYCOPYRROLATE 0.2 MG/ML IJ SOLN
INTRAMUSCULAR | Status: AC
  Filled 2015-08-01: qty 2

## 2015-08-01 MED ORDER — SUFENTANIL CITRATE 50 MCG/ML IV SOLN
INTRAVENOUS | Status: DC | PRN
  Administered 2015-08-01 (×4): 5 ug via INTRAVENOUS
  Administered 2015-08-01: 10 ug via INTRAVENOUS

## 2015-08-01 MED ORDER — DEXAMETHASONE SODIUM PHOSPHATE 10 MG/ML IJ SOLN
INTRAMUSCULAR | Status: DC | PRN
  Administered 2015-08-01: 10 mg via INTRAVENOUS

## 2015-08-01 MED ORDER — ACETAMINOPHEN 10 MG/ML IV SOLN
INTRAVENOUS | Status: AC
Start: 2015-08-01 — End: 2015-08-01
  Filled 2015-08-01: qty 100

## 2015-08-01 MED ORDER — SCOPOLAMINE 1 MG/3DAYS TD PT72
MEDICATED_PATCH | TRANSDERMAL | Status: AC
Start: 2015-08-01 — End: 2015-08-01
  Filled 2015-08-01: qty 1

## 2015-08-01 MED ORDER — MIDAZOLAM HCL 5 MG/5ML IJ SOLN
INTRAMUSCULAR | Status: DC | PRN
  Administered 2015-08-01: 2 mg via INTRAVENOUS

## 2015-08-01 MED ORDER — SODIUM CHLORIDE 0.9 % IJ SOLN: 3.0000 mL | INTRAMUSCULAR | Status: DC | PRN

## 2015-08-01 MED ORDER — ACETAMINOPHEN 10 MG/ML IV SOLN
INTRAVENOUS | Status: DC | PRN
  Administered 2015-08-01: 1000 mg via INTRAVENOUS

## 2015-08-01 MED ORDER — DEXAMETHASONE SODIUM PHOSPHATE 10 MG/ML IJ SOLN
INTRAMUSCULAR | Status: AC
  Filled 2015-08-01: qty 1

## 2015-08-01 MED ORDER — SODIUM CHLORIDE 0.9 % IV SOLN: 250.0000 mL | INTRAVENOUS | Status: DC | PRN

## 2015-08-01 MED ORDER — ACETAMINOPHEN 650 MG RE SUPP
650.0000 mg | RECTAL | Status: DC | PRN
  Filled 2015-08-01: qty 1

## 2015-08-01 MED ORDER — CEFAZOLIN SODIUM-DEXTROSE 2-3 GM-% IV SOLR
INTRAVENOUS | Status: AC
  Filled 2015-08-01: qty 50

## 2015-08-01 MED ORDER — LIDOCAINE HCL (CARDIAC) 20 MG/ML IV SOLN
INTRAVENOUS | Status: DC | PRN
  Administered 2015-08-01: 75 mg via INTRAVENOUS
  Administered 2015-08-01: 25 mg via INTRATRACHEAL

## 2015-08-01 MED ORDER — OXYCODONE-ACETAMINOPHEN 5-325 MG PO TABS: 1.0000 | ORAL_TABLET | ORAL | Status: DC | PRN

## 2015-08-01 MED ORDER — STERILE WATER FOR IRRIGATION IR SOLN
Status: DC | PRN
  Administered 2015-08-01: 1000 mL

## 2015-08-01 MED ORDER — ACETAMINOPHEN 160 MG/5ML PO SOLN: 325.0000 mg | ORAL | Status: DC | PRN

## 2015-08-01 MED ORDER — HYDROMORPHONE HCL 1 MG/ML IJ SOLN
0.2500 mg | INTRAMUSCULAR | Status: DC | PRN
Start: 2015-08-01 — End: 2015-08-01

## 2015-08-01 MED ORDER — OXYCODONE HCL 5 MG PO TABS: 5.0000 mg | ORAL_TABLET | ORAL | Status: DC | PRN

## 2015-08-01 MED ORDER — MIDAZOLAM HCL 2 MG/2ML IJ SOLN
INTRAMUSCULAR | Status: AC
  Filled 2015-08-01: qty 2

## 2015-08-01 MED ORDER — MORPHINE SULFATE (PF) 10 MG/ML IV SOLN: 2.0000 mg | INTRAVENOUS | Status: DC | PRN

## 2015-08-01 MED ORDER — LIDOCAINE HCL (CARDIAC) 20 MG/ML IV SOLN
INTRAVENOUS | Status: AC
  Filled 2015-08-01: qty 5

## 2015-08-01 MED ORDER — HYDROMORPHONE HCL 2 MG/ML IJ SOLN
INTRAMUSCULAR | Status: AC
  Filled 2015-08-01: qty 1

## 2015-08-01 MED ORDER — PROPOFOL 10 MG/ML IV BOLUS
INTRAVENOUS | Status: DC | PRN
  Administered 2015-08-01: 200 mg via INTRAVENOUS

## 2015-08-01 MED ORDER — OXYCODONE HCL 5 MG/5ML PO SOLN
5.0000 mg | Freq: Once | ORAL | Status: DC | PRN
  Filled 2015-08-01: qty 5

## 2015-08-01 MED ORDER — ROCURONIUM BROMIDE 100 MG/10ML IV SOLN
INTRAVENOUS | Status: AC
  Filled 2015-08-01: qty 1

## 2015-08-01 MED ORDER — NEOSTIGMINE METHYLSULFATE 10 MG/10ML IV SOLN
INTRAVENOUS | Status: AC
  Filled 2015-08-01: qty 1

## 2015-08-01 MED ORDER — ACETAMINOPHEN 500 MG PO TABS
1000.0000 mg | ORAL_TABLET | Freq: Four times a day (QID) | ORAL | Status: DC
  Filled 2015-08-01 (×4): qty 2

## 2015-08-01 MED ORDER — ACETAMINOPHEN 325 MG PO TABS: 325.0000 mg | ORAL_TABLET | ORAL | Status: DC | PRN

## 2015-08-01 MED ORDER — SODIUM CHLORIDE 0.9 % IJ SOLN
INTRAMUSCULAR | Status: AC
  Filled 2015-08-01: qty 10

## 2015-08-01 MED ORDER — CEFAZOLIN SODIUM-DEXTROSE 2-3 GM-% IV SOLR
2.0000 g | INTRAVENOUS | Status: AC
  Administered 2015-08-01: 2 g via INTRAVENOUS

## 2015-08-01 MED ORDER — DIPHENHYDRAMINE HCL 50 MG/ML IJ SOLN
INTRAMUSCULAR | Status: AC
  Filled 2015-08-01: qty 1

## 2015-08-01 MED ORDER — ACETAMINOPHEN 325 MG PO TABS: 650.0000 mg | ORAL_TABLET | ORAL | Status: DC | PRN

## 2015-08-01 MED ORDER — HYDROMORPHONE HCL 1 MG/ML IJ SOLN
INTRAMUSCULAR | Status: DC | PRN
  Administered 2015-08-01 (×2): 1 mg via INTRAVENOUS

## 2015-08-01 MED ORDER — OXYCODONE HCL 5 MG PO TABS: 5.0000 mg | ORAL_TABLET | Freq: Once | ORAL | Status: DC | PRN

## 2015-08-01 MED ORDER — SUCCINYLCHOLINE CHLORIDE 20 MG/ML IJ SOLN
INTRAMUSCULAR | Status: DC | PRN
  Administered 2015-08-01: 100 mg via INTRAVENOUS

## 2015-08-01 MED ORDER — KETOROLAC TROMETHAMINE 30 MG/ML IJ SOLN
INTRAMUSCULAR | Status: AC
  Filled 2015-08-01: qty 1

## 2015-08-01 MED ORDER — KETOROLAC TROMETHAMINE 30 MG/ML IJ SOLN
INTRAMUSCULAR | Status: DC | PRN
Start: 2015-08-01 — End: 2015-08-01
  Administered 2015-08-01: 30 mg via INTRAVENOUS

## 2015-08-01 MED ORDER — SCOPOLAMINE 1 MG/3DAYS TD PT72
MEDICATED_PATCH | TRANSDERMAL | Status: DC | PRN
  Administered 2015-08-01: 1 via TRANSDERMAL

## 2015-08-01 MED ORDER — PHENYLEPHRINE HCL 10 MG/ML IJ SOLN
INTRAMUSCULAR | Status: DC | PRN
  Administered 2015-08-01 (×2): 80 ug via INTRAVENOUS

## 2015-08-01 SURGICAL SUPPLY — 43 items
BAG SPEC RTRVL LRG 6X4 10 (ENDOMECHANICALS) ×2
CHLORAPREP W/TINT 26ML (MISCELLANEOUS) ×2 IMPLANT
COVER TIP SHEARS 8 DVNC (MISCELLANEOUS) ×1 IMPLANT
COVER TIP SHEARS 8MM DA VINCI (MISCELLANEOUS) ×1
DRAPE ARM DVNC X/XI (DISPOSABLE) ×4 IMPLANT
DRAPE COLUMN DVNC XI (DISPOSABLE) ×1 IMPLANT
DRAPE DA VINCI XI ARM (DISPOSABLE) ×4
DRAPE DA VINCI XI COLUMN (DISPOSABLE) ×1
DRAPE SHEET LG 3/4 BI-LAMINATE (DRAPES) ×4 IMPLANT
DRAPE SURG IRRIG POUCH 19X23 (DRAPES) ×2 IMPLANT
ELECT REM PT RETURN 9FT ADLT (ELECTROSURGICAL) ×2
ELECTRODE REM PT RTRN 9FT ADLT (ELECTROSURGICAL) ×1 IMPLANT
GLOVE BIO SURGEON STRL SZ 6 (GLOVE) ×8 IMPLANT
GLOVE BIO SURGEON STRL SZ 6.5 (GLOVE) ×4 IMPLANT
GOWN STRL REUS W/ TWL LRG LVL3 (GOWN DISPOSABLE) ×3 IMPLANT
GOWN STRL REUS W/TWL LRG LVL3 (GOWN DISPOSABLE) ×12
HOLDER FOLEY CATH W/STRAP (MISCELLANEOUS) ×2 IMPLANT
KIT BASIN OR (CUSTOM PROCEDURE TRAY) ×2 IMPLANT
LIQUID BAND (GAUZE/BANDAGES/DRESSINGS) ×2 IMPLANT
MANIPULATOR UTERINE 4.5 ZUMI (MISCELLANEOUS) ×1 IMPLANT
MARKER SKIN DUAL TIP RULER LAB (MISCELLANEOUS) ×2 IMPLANT
OBTURATOR XI 8MM BLADELESS (TROCAR) ×2 IMPLANT
OCCLUDER COLPOPNEUMO (BALLOONS) ×1 IMPLANT
PAD POSITIONING PINK XL (MISCELLANEOUS) ×1 IMPLANT
POUCH SPECIMEN RETRIEVAL 10MM (ENDOMECHANICALS) ×2 IMPLANT
SEAL CANN UNIV 5-8 DVNC XI (MISCELLANEOUS) ×4 IMPLANT
SEAL XI 5MM-8MM UNIVERSAL (MISCELLANEOUS) ×4
SET BI-LUMEN FLTR TB AIRSEAL (TUBING) ×1 IMPLANT
SET TUBE IRRIG SUCTION NO TIP (IRRIGATION / IRRIGATOR) ×2 IMPLANT
SHEET LAVH (DRAPES) ×2 IMPLANT
SOLUTION ELECTROLUBE (MISCELLANEOUS) ×2 IMPLANT
SUT VIC AB 0 CT1 27 (SUTURE)
SUT VIC AB 0 CT1 27XBRD ANTBC (SUTURE) IMPLANT
SUT VIC AB 4-0 PS2 27 (SUTURE) ×4 IMPLANT
SYR 50ML LL SCALE MARK (SYRINGE) ×1 IMPLANT
TOWEL OR 17X26 10 PK STRL BLUE (TOWEL DISPOSABLE) ×3 IMPLANT
TOWEL OR NON WOVEN STRL DISP B (DISPOSABLE) ×2 IMPLANT
TRAP SPECIMEN MUCOUS 40CC (MISCELLANEOUS) ×1 IMPLANT
TRAY FOLEY W/METER SILVER 14FR (SET/KITS/TRAYS/PACK) ×2 IMPLANT
TRAY LAPAROSCOPIC (CUSTOM PROCEDURE TRAY) ×2 IMPLANT
TROCAR BLADELESS OPT 5 100 (ENDOMECHANICALS) ×2 IMPLANT
TROCAR UNIVERSAL OPT 12M 100M (ENDOMECHANICALS) ×2 IMPLANT
WATER STERILE IRR 1500ML POUR (IV SOLUTION) ×2 IMPLANT

## 2015-08-01 NOTE — Transfer of Care (Signed)
Immediate Anesthesia Transfer of Care Note  Patient: Emma Lewis  Procedure(s) Performed: Procedure(s): ROBOTIC ASSISTED BILATERAL SALPINGO OOPHORECTOMY (Bilateral)  Patient Location: PACU  Anesthesia Type:General  Level of Consciousness: awake, alert , oriented and patient cooperative  Airway & Oxygen Therapy: Patient Spontanous Breathing and Patient connected to face mask oxygen  Post-op Assessment: Report given to RN, Post -op Vital signs reviewed and stable and Patient moving all extremities X 4  Post vital signs: stable  Last Vitals:  Filed Vitals:   08/01/15 0909  BP: 129/65  Pulse: 79  Temp: 36.3 C  Resp: 16    Complications: No apparent anesthesia complications

## 2015-08-01 NOTE — H&P (View-Only) (Signed)
Consult Note: Gyn-Onc  Consult was requested by Dr. Toney Rakes for the evaluation of Emma Lewis 65 y.o. female with a 8cm right ovarian complex mass  CC:  Chief Complaint  Patient presents with  . Pelvic mass    New consult    Assessment/Plan:  Emma Lewis  is a 65 y.o.  year old with a 8cm right ovarian complex mass. The mass is overall reassuring in appearance and is associated with normal tumor markers, however, given its increase in size between the September and November US's, and her postmenopausal age and its complexity on imaging, I believe it is reasonable for surgical removal for definitive histology, particularly as this patient is at average surgical risk.  I discussed that the recommended surgery would be robotic assisted BSO, possible hysterectomy, possible staging (pending frozen section results). I discussed surgical risks including   bleeding, infection, damage to internal organs (such as bladder,ureters, bowels), blood clot, reoperation and rehospitalization. I discussed anticipated hospital stay (outpatient) and recovery.   HPI: Emma Lewis is a 65 year old woman who is seen in consultation at the request of Dr. Toney Rakes for an enlarging complex right ovarian cyst. The patient reports having an MRI of the spine in September 2016 as she has a history of lumbar compression fractures. Adnexal mass was detected on this MRI and to better characterize it she was then sent for an ultrasound scan in [facility] imaging which demonstrated a normal uterus measuring 7.7 x 2.5 x 4.5 cm with an endometrial stripe of 1.7 mm, a left ovary that was normal, and a right ovary measuring 5.1 by 5 x 4.5 cm with a complex right adnexal mass and focal areas of increased density and shadowing. The patient was then seen by Dr. Toney Rakes in June 15, 2015 and a follow-up ultrasound scan was performed this confirmed now a 8.3 x 6 x 4.3 cm right adnexal cystic and solid mass with an echogenic  posterior wall shadowing noted. There was positive color-flow Doppler seen in the mass. The left ovary was normal. As was the uterus. She had several tumor markers drawn including a CA-125 which was 19, and 84 which was normal at 77, and a normal postmenopausal from the scan. These labs were drawn on 06/15/2015.   The patient has had no prior abdominal surgeries. She is overweight with a BMI of 31kg/m2.  Interval History: She denies symptoms of bloating, pelvic pain, early satiety. She denies postmenopausal bleeding.  Current Meds:  Outpatient Encounter Prescriptions as of 07/06/2015  Medication Sig  . acyclovir (ZOVIRAX) 400 MG tablet Take 400 mg by mouth daily.  . Artificial Tear Solution (SOOTHE XP) SOLN Place 1 drop into both eyes daily as needed (dry eyes).  . Calcium Carb-Cholecalciferol (CALCIUM 600 + D PO) Take 1 tablet by mouth daily.  . cholecalciferol (VITAMIN D) 1000 UNITS tablet Take 1,000 Units by mouth 3 (three) times daily.  . clobetasol cream (TEMOVATE) [id number] % Apply 1 application topically 2 (two) times daily. Apply twice a day for two weeks then twice a week  . Famotidine (PEPCID PO) Take by mouth as needed.  Marland Kitchen ibuprofen (ADVIL,MOTRIN) 200 MG tablet Take 800 mg by mouth every 6 (six) hours as needed (for pain).  Marland Kitchen levothyroxine (SYNTHROID, LEVOTHROID) 100 MCG tablet Take 100 mcg by mouth daily before breakfast.  . MAGNESIUM PO Take 1 tablet by mouth 3 (three) times daily.  . Omega-3 Fatty Acids (FISH OIL) 1200 MG CAPS Take 1 capsule by mouth 3 (  three) times daily.  . Vitamin Mixture (ESTER-C PO) Take 1 tablet by mouth 3 (three) times daily.  Marland Kitchen HYDROcodone-acetaminophen (NORCO/VICODIN) 5-325 MG per tablet Take 1 tablet by mouth every 6 (six) hours as needed for moderate pain.  . phentermine (ADIPEX-P) 37.5 MG tablet Take 37.5 mg by mouth daily before breakfast.  . [DISCONTINUED] alendronate (FOSAMAX) 70 MG tablet Take 70 mg by mouth once a week. Take with a full glass of water  on an empty stomach.   No facility-administered encounter medications on file as of 07/06/2015.    Allergy:  Allergies  Allergen Reactions  . Sulfa Antibiotics Other (See Comments)    Mother said "I liked to have died"  . Sulfur Other (See Comments)    Pt stated my mother told me "I almost died"    Social Hx:   Social History   Social History  . Marital Status: Married    Spouse Name: N/A  . Number of Children: N/A  . Years of Education: N/A   Occupational History  . Not on file.   Social History Main Topics  . Smoking status: Never Smoker   . Smokeless tobacco: Never Used  . Alcohol Use: Yes     Comment: rare  . Drug Use: No  . Sexual Activity: Not on file   Other Topics Concern  . Not on file   Social History Narrative    Past Surgical Hx:  Past Surgical History  Procedure Laterality Date  . Ganglion cyst excision Left 1970s    wrist  . Knee surgery Right   . Shoulder open rotator cuff repair Right   . Shoulder surgery Right   . Joint replacement    . Hip arthroplasty Left   . Colonoscopy    . Anterior cervical decomp/discectomy fusion N/A 02/09/2014    Procedure: ANTERIOR CERVICAL DECOMPRESSION/DISCECTOMY FUSION 2 LEVELS;  Surgeon: [name], MD;  Location: [facility];  Service: Orthopedics;  Laterality: N/A;  C4-5, C5-6 Anterior Cervical Discectomy and Fusion, Allograft, Plate    Past Medical Hx:  Past Medical History  Diagnosis Date  . PONV (postoperative nausea and vomiting)   . Bronchitis     hx of  . Pneumonia     hx of  . Hypothyroidism   . Arthritis   . Seasonal allergies   . Fever blister     Takes Acyclovir  . Hyperlipemia   . GERD (gastroesophageal reflux disease)     Past Gynecological History:   No LMP recorded. Patient is postmenopausal.  Family Hx:  Family History  Problem Relation Age of Onset  . Lymphoma Mother     Review of Systems:  Constitutional  Feels well,    ENT Normal appearing ears and nares  bilaterally Skin/Breast  No rash, sores, jaundice, itching, dryness Cardiovascular  No chest pain, shortness of breath, or edema  Pulmonary  No cough or wheeze.  Gastro Intestinal  No nausea, vomitting, or diarrhoea. No bright red blood per rectum, no abdominal pain, change in bowel movement, or constipation.  Genito Urinary  No frequency, urgency, dysuria,  Musculo Skeletal  No myalgia, arthralgia, joint swelling or pain  Neurologic  No weakness, numbness, change in gait,  Psychology  No depression, anxiety, insomnia.   Vitals:  Blood pressure 111/63, pulse 84, temperature 97.9 F (36.6 C), temperature source Oral, resp. rate 18, height 5\' 5"  (1.651 m), weight 190 lb 11.2 oz (86.501 kg), SpO2 98 %.  Physical Exam: WD in NAD Neck  Supple NROM, without any enlargements.  Lymph Node Survey No cervical supraclavicular or inguinal adenopathy Cardiovascular  Pulse normal rate, regularity and rhythm. S1 and S2 normal.  Lungs  Clear to auscultation bilateraly, without wheezes/crackles/rhonchi. Good air movement.  Skin  No rash/lesions/breakdown  Psychiatry  Alert and oriented to person, place, and time  Abdomen  Normoactive bowel sounds, abdomen soft, non-tender and overweigh without evidence of hernia.  Back No CVA tenderness Genito Urinary  Vulva/vagina: Normal external female genitalia.  No lesions. No discharge or bleeding.  Bladder/urethra:  No lesions or masses, well supported bladder  Vagina: grossly normal  Cervix: Normal appearing, no lesions.  Uterus:  Small, mobile, no parametrial involvement or nodularity.  Adnexa: fullness, smooth in the right adnexa, mobile.  Rectal  Good tone, no masses no cul de sac nodularity.  Extremities  No bilateral cyanosis, clubbing or edema.   Donaciano Eva, MD  07/06/2015, 5:33 PM

## 2015-08-01 NOTE — Anesthesia Procedure Notes (Signed)
Procedure Name: Intubation Date/Time: 08/01/2015 10:42 AM Performed by: Lissa Morales Pre-anesthesia Checklist: Patient identified, Emergency Drugs available, Suction available and Patient being monitored Patient Re-evaluated:Patient Re-evaluated prior to inductionOxygen Delivery Method: Circle System Utilized Preoxygenation: Pre-oxygenation with 100% oxygen Intubation Type: IV induction Ventilation: Mask ventilation without difficulty Laryngoscope Size: Glidescope and 4 Tube type: Oral Tube size: 7.5 mm Number of attempts: 1 Airway Equipment and Method: Stylet and Oral airway Placement Confirmation: ETT inserted through vocal cords under direct vision,  positive ETCO2 and breath sounds checked- equal and bilateral Secured at: 21 cm Tube secured with: Tape Dental Injury: Teeth and Oropharynx as per pre-operative assessment

## 2015-08-01 NOTE — Discharge Instructions (Signed)
Unilateral Salpingo-Oophorectomy, Care After °Refer to this sheet in the next few weeks. These instructions provide you with information on caring for yourself after your procedure. Your health care provider may also give you more specific instructions. Your treatment has been planned according to current medical practices, but problems sometimes occur. Call your health care provider if you have any problems or questions after your procedure. °WHAT TO EXPECT AFTER THE PROCEDURE °After your procedure, it is typical to have the following: °· Abdominal pain that can be controlled with pain medicine. °· Vaginal spotting. °· Constipation. °HOME CARE INSTRUCTIONS  °· Get plenty of rest and sleep. °· Only take over-the-counter or prescription medicines as directed by your health care provider. Do not take aspirin. It can cause bleeding. °· Keep incision areas clean and dry. Remove or change any bandages (dressings) only as directed by your health care provider. °· Follow your health care provider's advice regarding diet. °· Drink enough fluids to keep your urine clear or pale yellow. °· Limit exercise and activities as directed by your health care provider. Do not lift anything heavier than 5 pounds (2.3 kg) until your health care provider approves. °· Do not drive until your health care provider approves. °· Do not drink alcohol until your health care provider approves. °· Do not have sexual intercourse until your health care provider says it is OK. °· Take your temperature twice a day and write it down. °· If you become constipated, you may: °¨ Ask your health care provider about taking a mild laxative. °¨ Add more fruit and bran to your diet. °¨ Drink more fluids. °· Follow up with your health care provider as directed. °SEEK MEDICAL CARE IF:  °· You have swelling or redness in the incision area. °· You develop a rash. °· You feel lightheaded. °· You have pain that is not controlled with medicine. °· You have pain,  swelling, or redness where the IV access tube was placed. °SEEK IMMEDIATE MEDICAL CARE IF: °· You have a fever. °· You develop increasing abdominal pain. °· You see pus coming out of the incision, or the incision is separating. °· You notice a bad smell coming from the wound or dressing. °· You have excessive vaginal bleeding. °· You feel sick to your stomach (nauseous) and vomit. °· You have leg or chest pain. °· You have pain when you urinate. °· You develop shortness of breath. °· You pass out. °  °This information is not intended to replace advice given to you by your health care provider. Make sure you discuss any questions you have with your health care provider. °  °Document Released: 05/11/2009 Document Revised: 05/05/2013 Document Reviewed: 01/06/2013 °Elsevier Interactive Patient Education ©2016 [employer] ° °

## 2015-08-01 NOTE — Anesthesia Preprocedure Evaluation (Signed)
Anesthesia Evaluation  Patient identified by MRN, date of birth, ID band Patient awake    Reviewed: Allergy & Precautions, NPO status , Patient's Chart, lab work & pertinent test results  History of Anesthesia Complications (+) PONV and history of anesthetic complications  Airway Mallampati: III  TM Distance: <3 FB Neck ROM: Full    Dental  (+) Teeth Intact   Pulmonary neg shortness of breath, neg sleep apnea, neg COPD, neg recent URI, neg PE   breath sounds clear to auscultation       Cardiovascular negative cardio ROS   Rhythm:Regular     Neuro/Psych negative neurological ROS  negative psych ROS   GI/Hepatic Neg liver ROS, GERD  Medicated and Controlled,  Endo/Other  Hypothyroidism Morbid obesity  Renal/GU negative Renal ROS     Musculoskeletal  (+) Arthritis ,   Abdominal   Peds  Hematology negative hematology ROS (+)   Anesthesia Other Findings Previous acdf  Reproductive/Obstetrics                             Anesthesia Physical Anesthesia Plan  ASA: II  Anesthesia Plan: General   Post-op Pain Management:    Induction: Intravenous  Airway Management Planned: Oral ETT  Additional Equipment: None  Intra-op Plan:   Post-operative Plan: Extubation in OR  Informed Consent: I have reviewed the patients History and Physical, chart, labs and discussed the procedure including the risks, benefits and alternatives for the proposed anesthesia with the patient or authorized representative who has indicated his/her understanding and acceptance.   Dental advisory given  Plan Discussed with: CRNA and Surgeon  Anesthesia Plan Comments:         Anesthesia Quick Evaluation

## 2015-08-01 NOTE — Interval H&P Note (Signed)
History and Physical Interval Note:  08/01/2015 9:24 AM  Emma Lewis  has presented today for surgery, with the diagnosis of PELVIC MASS  The various methods of treatment have been discussed with the patient and family. After consideration of risks, benefits and other options for treatment, the patient has consented to  Procedure(s): ROBOTIC ASSISTED BILATERAL SALPINGO OOPHORECTOMY, POSSIBLE HYSTERECTOMY TOTAL LAPAROSCOPIC, POSSIBLE STAGGING (Bilateral) as a surgical intervention .  The patient's history has been reviewed, patient examined, no change in status, stable for surgery.  I have reviewed the patient's chart and labs.  Questions were answered to the patient's satisfaction.     Donaciano Eva

## 2015-08-01 NOTE — Op Note (Signed)
OPERATIVE NOTE  Date: 08/01/15  Preoperative Diagnosis: right ovarian complex mass   Postoperative Diagnosis:  same  Procedure(s) Performed: Robotic-assisted laparoscopic bilateral salpingo-oophorectomy  Surgeon: Everitt Amber, M.D.  Assistant Surgeon: Lahoma Crocker M.D. (an MD assistant was necessary for tissue manipulation, management of robotic instrumentation, retraction and positioning due to the complexity of the case and hospital policies).   Anesthesia: Gen. endotracheal.  Specimens: Bilateral ovaries, fallopian tubes, pelvic washings  Estimated Blood Loss: <20 mL. Blood Replacement: None  Complications: none  Indication for Procedure:  Right ovarian complex cyst increasing in size  Operative Findings: 8cm retroperitoneal ovarian mass. Benign mature teratoma on frozen. Normal uterus, left tube and ovary.  Frozen pathology was consistent with teratoma (mature)  Procedure: The patient's taken to the operating room and placed under general endotracheal anesthesia testing difficulty. She is placed in a dorsolithotomy position and cervical acromial pad was placed. The arms were tucked with care taken to pad the olecranon process. And prepped and draped in usual sterile fashion. A uterine manipulator (zumi) was placed vaginally. A 39mm incision was made in the left upper quadrant palmer's point and a 5 mm Optiview trocar used to enter the abdomen under direct visualization. With entry into the abdomen and then maintenance of 15 mm of mercury the patient was placed in Trendelenburg position. An incision was made in the umbilicus and a 60mm trochar was placed through this site. Two incisions were made lateral to the umbilical incision in the left and right abdomen measuring 1mm. These incisions were made approximately 10 cm lateral to the umbilical incision. 8 mm robotic trochars were inserted. The robot was docked.  The abdomen was inspected as was the pelvis.  Pelvic washings were  obtained. An incision was made on the right pelvic side wall peritoneum parallel to the IP ligament and the retroperitoneal space entered. The right ureter was identified and was dissected off of the retroperitoneal right mass with sharp dissection. The para-rectal space was developed. A window was created in the right broad ligament above the ureter. The right infundibulopelvic vessels were skeletonized cauterized and transected. The utero-ovarian ligaments similarly were cauterized and transected with visualization of the ureter in the retroperitoneal space throughout. Specimen was placed in an Endo Catch bag.  In a similar manner the left peritoneum and the side wall was incised, and the retroperitoneal space entered. The left ureter was identified and the left pararectal space was developed. The utero-ovarian ligament was skeletonized cauterized and transected. The left utero-ovarian ligaments were cauterized and transected in the left adnexa was placed in an Endo Catch bag.  The abdomen was copiously irrigated and drained and all operative sites inspected and hemostasis was assured  The robot was undocked. The camera was placed through the left upper abdominal incision. The contents of the right Endo Catch bag were first aspirated and then morcellated to facilitate removal from the abdominal cavity through the umbilical incision. In a similar fashion the contents of the left Endo Catch bag or morcellated to facilitate removal from the abdominal cavity. Frozen section returned benign on the right ovary.  The ports were all remove. The fascial closure at the umbilical incision and left upper quadrant port was made with 0 Vicryl.  All incisions were closed with a running subcuticular Monocryl suture. Dermabond was applied. An abrasion of the perineum from the placement of the manipulator was observed. It was reapproximated with 4-0 vicryl to create hemostasis and apposition. Sponge, lap and needle counts  were correct  x 3.    The patient had sequential compression devices for VTE prophylaxis.         Disposition: PACU          Condition: stable  Donaciano Eva, MD

## 2015-08-01 NOTE — Anesthesia Postprocedure Evaluation (Signed)
Anesthesia Post Note  Patient: Emma Lewis  Procedure(s) Performed: Procedure(s) (LRB): ROBOTIC ASSISTED BILATERAL SALPINGO OOPHORECTOMY (Bilateral)  Patient location during evaluation: PACU Anesthesia Type: General Level of consciousness: awake and alert Pain management: pain level controlled Vital Signs Assessment: post-procedure vital signs reviewed and stable Respiratory status: spontaneous breathing, nonlabored ventilation, respiratory function stable and patient connected to nasal cannula oxygen Cardiovascular status: blood pressure returned to baseline and stable Postop Assessment: no signs of nausea or vomiting Anesthetic complications: no    Last Vitals:  Filed Vitals:   08/01/15 1330 08/01/15 1345  BP: 122/87 119/62  Pulse: 87   Temp:  36.7 C  Resp: 18 15    Last Pain: There were no vitals filed for this visit.               Letonya Mangels J

## 2015-08-02 ENCOUNTER — Telehealth: Payer: Self-pay

## 2015-08-02 NOTE — Telephone Encounter (Signed)
Orders received to contact the patient to see how she was doing s/p post-op ,patient states she a little sore , but doing well. States she just started taking her pain medication today and they are providing relief . Patient also started eating solids last night , states she is tolerating eating "ok" and is getting around ok . Patient also updated with report of surgical pathology was "benign" , patient states was happy with the results . Patient aware to call with any changes , questions or concerns , patient as our contact information .Post-Op  follow up appointment scheduled for September 01, 2015 at 1:15 PM with Dr Everitt Amber.

## 2015-08-11 DIAGNOSIS — M545 Low back pain: Secondary | ICD-10-CM | POA: Diagnosis not present

## 2015-08-11 DIAGNOSIS — Z6832 Body mass index (BMI) 32.0-32.9, adult: Secondary | ICD-10-CM | POA: Diagnosis not present

## 2015-08-11 DIAGNOSIS — E669 Obesity, unspecified: Secondary | ICD-10-CM | POA: Diagnosis not present

## 2015-08-11 DIAGNOSIS — Z1389 Encounter for screening for other disorder: Secondary | ICD-10-CM | POA: Diagnosis not present

## 2015-08-11 DIAGNOSIS — E785 Hyperlipidemia, unspecified: Secondary | ICD-10-CM | POA: Diagnosis not present

## 2015-08-11 DIAGNOSIS — Z23 Encounter for immunization: Secondary | ICD-10-CM | POA: Diagnosis not present

## 2015-08-11 DIAGNOSIS — K760 Fatty (change of) liver, not elsewhere classified: Secondary | ICD-10-CM | POA: Diagnosis not present

## 2015-08-11 DIAGNOSIS — R7303 Prediabetes: Secondary | ICD-10-CM | POA: Diagnosis not present

## 2015-08-11 DIAGNOSIS — E039 Hypothyroidism, unspecified: Secondary | ICD-10-CM | POA: Diagnosis not present

## 2015-08-15 DIAGNOSIS — Z136 Encounter for screening for cardiovascular disorders: Secondary | ICD-10-CM | POA: Diagnosis not present

## 2015-08-15 DIAGNOSIS — R7303 Prediabetes: Secondary | ICD-10-CM | POA: Diagnosis not present

## 2015-08-15 DIAGNOSIS — I10 Essential (primary) hypertension: Secondary | ICD-10-CM | POA: Diagnosis not present

## 2015-09-01 ENCOUNTER — Ambulatory Visit: Payer: Medicare Other | Attending: Gynecologic Oncology | Admitting: Gynecologic Oncology

## 2015-09-01 ENCOUNTER — Encounter: Payer: Self-pay | Admitting: Gynecologic Oncology

## 2015-09-01 VITALS — BP 137/74 | HR 88 | Temp 97.6°F | Resp 18 | Ht 65.0 in | Wt 190.0 lb

## 2015-09-01 DIAGNOSIS — D270 Benign neoplasm of right ovary: Secondary | ICD-10-CM

## 2015-09-01 DIAGNOSIS — Z9079 Acquired absence of other genital organ(s): Secondary | ICD-10-CM | POA: Diagnosis not present

## 2015-09-01 DIAGNOSIS — R1900 Intra-abdominal and pelvic swelling, mass and lump, unspecified site: Secondary | ICD-10-CM

## 2015-09-01 DIAGNOSIS — Z90722 Acquired absence of ovaries, bilateral: Secondary | ICD-10-CM | POA: Diagnosis not present

## 2015-09-01 NOTE — Progress Notes (Signed)
POSTOPERATIVE FOLLOW-UP   Assessment:    65 y.o. year old with a history of a right ovarian teratoma.   S/p robotic assisted bilateral salpingo-oophorectomy on 08/01/15.   Plan: 1) Pathology reports reviewed today 2) Treatment counseling - I discussed the benign nature of this lesion and no follow-up is required. She did not have a hysterectomy and is therefore still elligible for cervical cancer screening. She was given the opportunity to ask questions, which were answered to her satisfaction, and she is agreement with the above mentioned plan of care.  3)  Return to clinic on a prn basis only. She will follow-up with Dr's Chauncy Passy and Marylen Ponto for well-woman care.  HPI:  Emma Lewis is a 65 y.o. year old [id number] initially seen in consultation on 07/05/16 for an enlarging right ovarian mass (8cm).  She then underwent a robotic BSO on [id number] without complications.  Her postoperative course was uncomplicated.  Her final pathology revealed a benign right ovarian mature teratoma.  She is seen today for a postoperative check and to discuss her pathology results and ongoing plan.  Since discharge from the hospital, she is feeling well.  She has improving appetite, normal bowel and bladder function, and pain controlled with minimal PO medication. She has no other complaints today.    Review of systems: Constitutional:  She has no weight gain or weight loss. She has no fever or chills. Eyes: No blurred vision Ears, Nose, Mouth, Throat: No dizziness, headaches or changes in hearing. No mouth sores. Cardiovascular: No chest pain, palpitations or edema. Respiratory:  No shortness of breath, wheezing or cough Gastrointestinal: She has normal bowel movements without diarrhea or constipation. She denies any nausea or vomiting. She denies blood in her stool or heart burn. Genitourinary:  She denies pelvic pain, pelvic pressure or changes in her urinary function. She has no hematuria, dysuria, or incontinence.  She has no irregular vaginal bleeding or vaginal discharge Musculoskeletal: Denies muscle weakness or joint pains.  Skin:  She has no skin changes, rashes or itching Neurological:  Denies dizziness or headaches. No neuropathy, no numbness or tingling. Psychiatric:  She denies depression or anxiety. Hematologic/Lymphatic:   No easy bruising or bleeding   Physical Exam: Blood pressure 137/74, pulse 88, temperature 97.6 F (36.4 C), resp. rate 18, height 5\' 5"  (1.651 m), weight 190 lb (86.183 kg), SpO2 99 %. General: Well dressed, well nourished in no apparent distress.   Abdomen:  Soft, nontender, nondistended.  No palpable masses.  No hepatosplenomegaly.  No ascites. Normal bowel sounds.  No hernias.  Incisions are well healed Genitourinary: deferred Extremities: No cyanosis, clubbing or edema.  No calf tenderness or erythema. No palpable cords. Psychiatric: Mood and affect are appropriate. Neurological: Awake, alert and oriented x 3. Sensation is intact, no neuropathy.  Musculoskeletal: No pain, normal strength and range of motion.  Donaciano Eva, MD

## 2015-09-01 NOTE — Patient Instructions (Signed)
Please call for any questions or concerns.  No necessary follow up with Dr. Denman George at this time.

## 2015-09-11 DIAGNOSIS — Z6833 Body mass index (BMI) 33.0-33.9, adult: Secondary | ICD-10-CM | POA: Diagnosis not present

## 2015-09-11 DIAGNOSIS — Z1389 Encounter for screening for other disorder: Secondary | ICD-10-CM | POA: Diagnosis not present

## 2015-09-11 DIAGNOSIS — E669 Obesity, unspecified: Secondary | ICD-10-CM | POA: Diagnosis not present

## 2015-09-11 DIAGNOSIS — M8580 Other specified disorders of bone density and structure, unspecified site: Secondary | ICD-10-CM | POA: Diagnosis not present

## 2015-09-11 DIAGNOSIS — M545 Low back pain: Secondary | ICD-10-CM | POA: Diagnosis not present

## 2015-09-11 DIAGNOSIS — I10 Essential (primary) hypertension: Secondary | ICD-10-CM | POA: Diagnosis not present

## 2015-09-11 DIAGNOSIS — R7303 Prediabetes: Secondary | ICD-10-CM | POA: Diagnosis not present

## 2015-09-11 DIAGNOSIS — Z9181 History of falling: Secondary | ICD-10-CM | POA: Diagnosis not present

## 2015-10-05 DIAGNOSIS — M5136 Other intervertebral disc degeneration, lumbar region: Secondary | ICD-10-CM | POA: Diagnosis not present

## 2015-10-05 DIAGNOSIS — S32040A Wedge compression fracture of fourth lumbar vertebra, initial encounter for closed fracture: Secondary | ICD-10-CM | POA: Diagnosis not present

## 2015-10-16 DIAGNOSIS — S32040D Wedge compression fracture of fourth lumbar vertebra, subsequent encounter for fracture with routine healing: Secondary | ICD-10-CM | POA: Diagnosis not present

## 2015-10-25 DIAGNOSIS — S32040D Wedge compression fracture of fourth lumbar vertebra, subsequent encounter for fracture with routine healing: Secondary | ICD-10-CM | POA: Diagnosis not present

## 2015-11-01 DIAGNOSIS — S32040D Wedge compression fracture of fourth lumbar vertebra, subsequent encounter for fracture with routine healing: Secondary | ICD-10-CM | POA: Diagnosis not present

## 2015-11-02 DIAGNOSIS — M5136 Other intervertebral disc degeneration, lumbar region: Secondary | ICD-10-CM | POA: Diagnosis not present

## 2015-11-28 DIAGNOSIS — M25512 Pain in left shoulder: Secondary | ICD-10-CM | POA: Diagnosis not present

## 2015-12-11 DIAGNOSIS — G8929 Other chronic pain: Secondary | ICD-10-CM | POA: Diagnosis not present

## 2015-12-11 DIAGNOSIS — M25512 Pain in left shoulder: Secondary | ICD-10-CM | POA: Diagnosis not present

## 2015-12-19 DIAGNOSIS — M25512 Pain in left shoulder: Secondary | ICD-10-CM | POA: Diagnosis not present

## 2016-01-09 DIAGNOSIS — E785 Hyperlipidemia, unspecified: Secondary | ICD-10-CM | POA: Diagnosis not present

## 2016-01-09 DIAGNOSIS — K760 Fatty (change of) liver, not elsewhere classified: Secondary | ICD-10-CM | POA: Diagnosis not present

## 2016-01-09 DIAGNOSIS — E039 Hypothyroidism, unspecified: Secondary | ICD-10-CM | POA: Diagnosis not present

## 2016-01-09 DIAGNOSIS — I10 Essential (primary) hypertension: Secondary | ICD-10-CM | POA: Diagnosis not present

## 2016-01-09 DIAGNOSIS — B372 Candidiasis of skin and nail: Secondary | ICD-10-CM | POA: Diagnosis not present

## 2016-01-09 DIAGNOSIS — Z6833 Body mass index (BMI) 33.0-33.9, adult: Secondary | ICD-10-CM | POA: Diagnosis not present

## 2016-01-09 DIAGNOSIS — R7303 Prediabetes: Secondary | ICD-10-CM | POA: Diagnosis not present

## 2016-01-09 DIAGNOSIS — E669 Obesity, unspecified: Secondary | ICD-10-CM | POA: Diagnosis not present

## 2016-01-18 DIAGNOSIS — H25811 Combined forms of age-related cataract, right eye: Secondary | ICD-10-CM | POA: Diagnosis not present

## 2016-01-24 DIAGNOSIS — H2511 Age-related nuclear cataract, right eye: Secondary | ICD-10-CM | POA: Diagnosis not present

## 2016-01-24 DIAGNOSIS — Z961 Presence of intraocular lens: Secondary | ICD-10-CM | POA: Diagnosis not present

## 2016-01-24 DIAGNOSIS — H25811 Combined forms of age-related cataract, right eye: Secondary | ICD-10-CM | POA: Diagnosis not present

## 2016-02-15 DIAGNOSIS — H25812 Combined forms of age-related cataract, left eye: Secondary | ICD-10-CM | POA: Diagnosis not present

## 2016-02-15 DIAGNOSIS — H52202 Unspecified astigmatism, left eye: Secondary | ICD-10-CM | POA: Diagnosis not present

## 2016-02-15 DIAGNOSIS — Z961 Presence of intraocular lens: Secondary | ICD-10-CM | POA: Diagnosis not present

## 2016-02-15 DIAGNOSIS — H2512 Age-related nuclear cataract, left eye: Secondary | ICD-10-CM | POA: Diagnosis not present

## 2016-02-16 DIAGNOSIS — M9903 Segmental and somatic dysfunction of lumbar region: Secondary | ICD-10-CM | POA: Diagnosis not present

## 2016-02-16 DIAGNOSIS — M47816 Spondylosis without myelopathy or radiculopathy, lumbar region: Secondary | ICD-10-CM | POA: Diagnosis not present

## 2016-02-16 DIAGNOSIS — M5417 Radiculopathy, lumbosacral region: Secondary | ICD-10-CM | POA: Diagnosis not present

## 2016-02-16 DIAGNOSIS — M4854XA Collapsed vertebra, not elsewhere classified, thoracic region, initial encounter for fracture: Secondary | ICD-10-CM | POA: Diagnosis not present

## 2016-02-23 DIAGNOSIS — H04123 Dry eye syndrome of bilateral lacrimal glands: Secondary | ICD-10-CM | POA: Diagnosis not present

## 2016-03-04 DIAGNOSIS — E785 Hyperlipidemia, unspecified: Secondary | ICD-10-CM | POA: Diagnosis not present

## 2016-03-04 DIAGNOSIS — E039 Hypothyroidism, unspecified: Secondary | ICD-10-CM | POA: Diagnosis not present

## 2016-03-14 DIAGNOSIS — M545 Low back pain: Secondary | ICD-10-CM | POA: Diagnosis not present

## 2016-03-14 DIAGNOSIS — B001 Herpesviral vesicular dermatitis: Secondary | ICD-10-CM | POA: Diagnosis not present

## 2016-04-19 DIAGNOSIS — Z96642 Presence of left artificial hip joint: Secondary | ICD-10-CM | POA: Diagnosis not present

## 2016-04-19 DIAGNOSIS — T8484XA Pain due to internal orthopedic prosthetic devices, implants and grafts, initial encounter: Secondary | ICD-10-CM | POA: Diagnosis not present

## 2016-04-19 DIAGNOSIS — M25551 Pain in right hip: Secondary | ICD-10-CM | POA: Diagnosis not present

## 2016-04-26 DIAGNOSIS — H04123 Dry eye syndrome of bilateral lacrimal glands: Secondary | ICD-10-CM | POA: Diagnosis not present

## 2016-04-29 DIAGNOSIS — Z01818 Encounter for other preprocedural examination: Secondary | ICD-10-CM | POA: Diagnosis not present

## 2016-04-29 DIAGNOSIS — Z6833 Body mass index (BMI) 33.0-33.9, adult: Secondary | ICD-10-CM | POA: Diagnosis not present

## 2016-04-29 DIAGNOSIS — I10 Essential (primary) hypertension: Secondary | ICD-10-CM | POA: Diagnosis not present

## 2016-04-29 DIAGNOSIS — K760 Fatty (change of) liver, not elsewhere classified: Secondary | ICD-10-CM | POA: Diagnosis not present

## 2016-04-29 DIAGNOSIS — Z2821 Immunization not carried out because of patient refusal: Secondary | ICD-10-CM | POA: Diagnosis not present

## 2016-05-01 ENCOUNTER — Ambulatory Visit: Payer: Self-pay | Admitting: Orthopedic Surgery

## 2016-05-02 DIAGNOSIS — M25562 Pain in left knee: Secondary | ICD-10-CM | POA: Diagnosis not present

## 2016-05-02 DIAGNOSIS — Z96649 Presence of unspecified artificial hip joint: Secondary | ICD-10-CM | POA: Diagnosis not present

## 2016-05-02 DIAGNOSIS — T8484XA Pain due to internal orthopedic prosthetic devices, implants and grafts, initial encounter: Secondary | ICD-10-CM | POA: Diagnosis not present

## 2016-05-15 IMAGING — US US PELVIS COMPLETE
1 series · 14 of 25 positions shown · non-contrast
Comparison: MRI 03/29/2015.

CLINICAL DATA: Pelvic mass.

EXAM:
TRANSABDOMINAL AND TRANSVAGINAL ULTRASOUND OF PELVIS
TECHNIQUE: Both transabdominal and transvaginal ultrasound examinations of the
pelvis were performed. Transabdominal technique was performed for
global imaging of the pelvis including uterus, ovaries, adnexal
regions, and pelvic cul-de-sac. It was necessary to proceed with
endovaginal exam following the transabdominal exam to visualize the
uterus and ovaries.

[Series 1: us pelvis complete · 0.18mm/px · 14 of 58 slices shown]
[im 1/58]
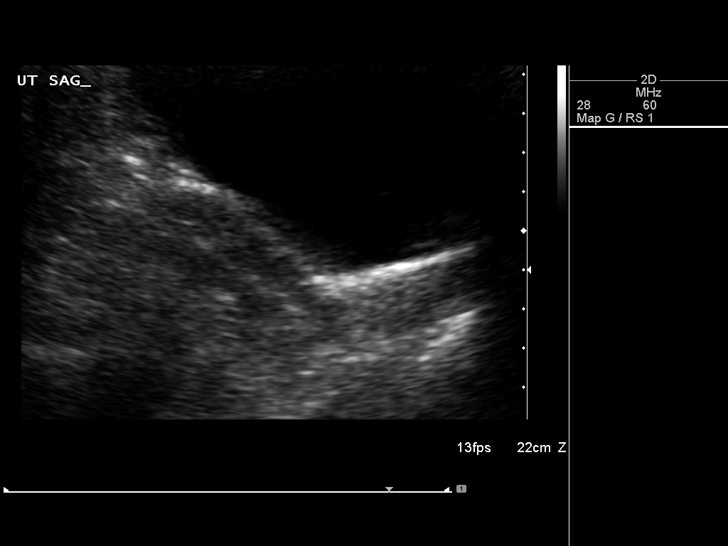
[im 5/58]
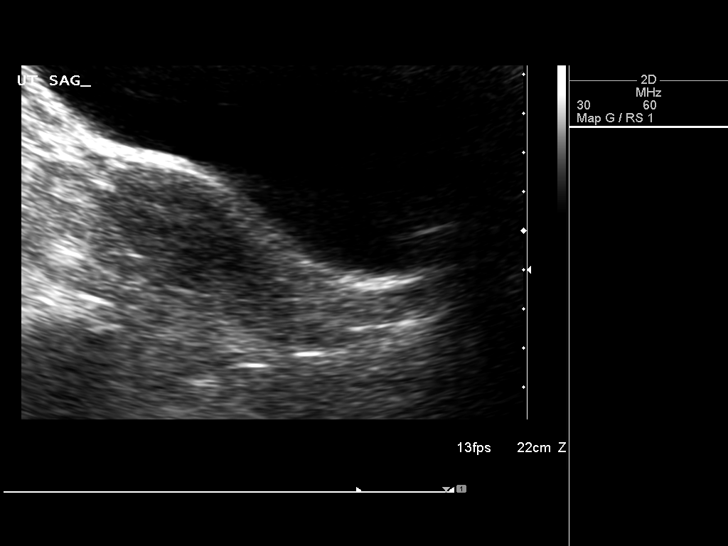
[im 10/58]
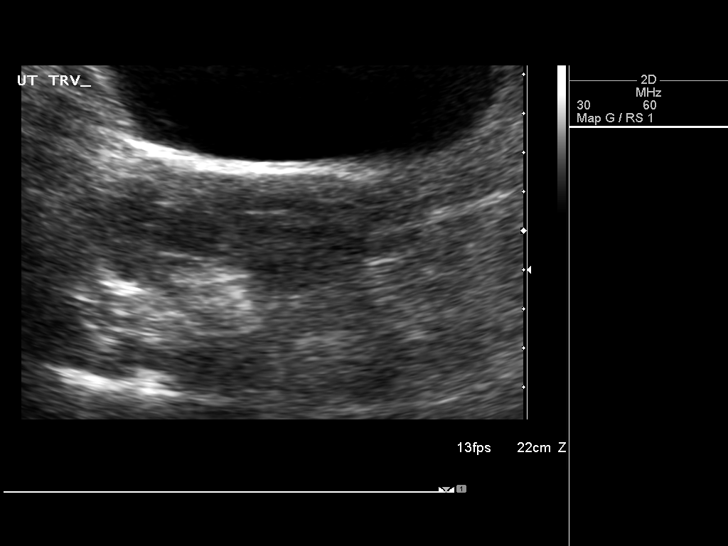
[im 15/58]
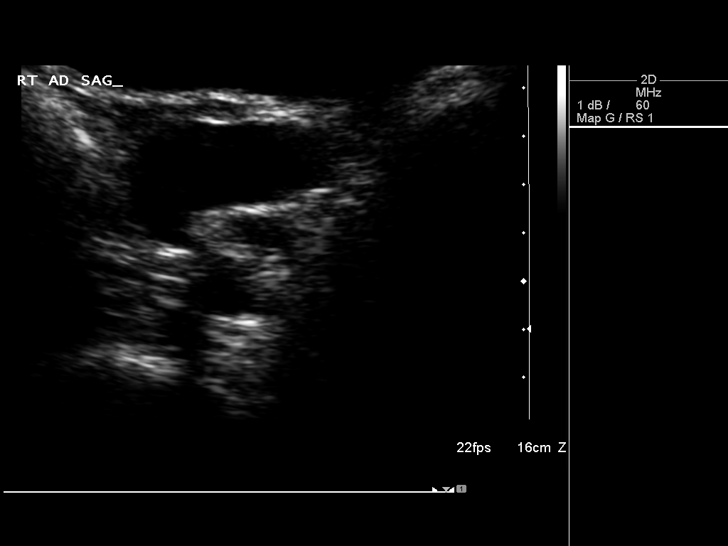
[im 20/58]
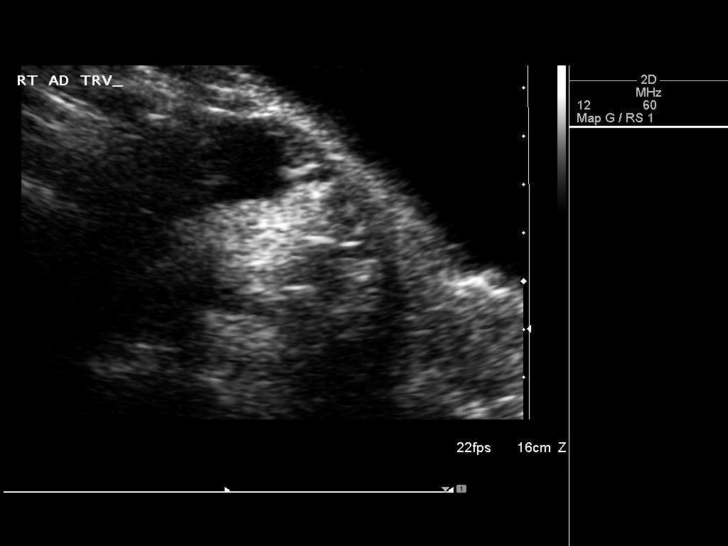
[im 22/58]
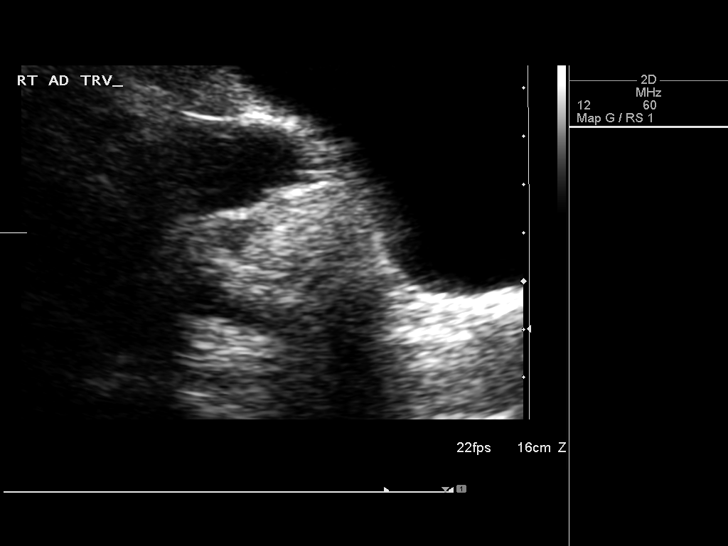
[im 27/58]
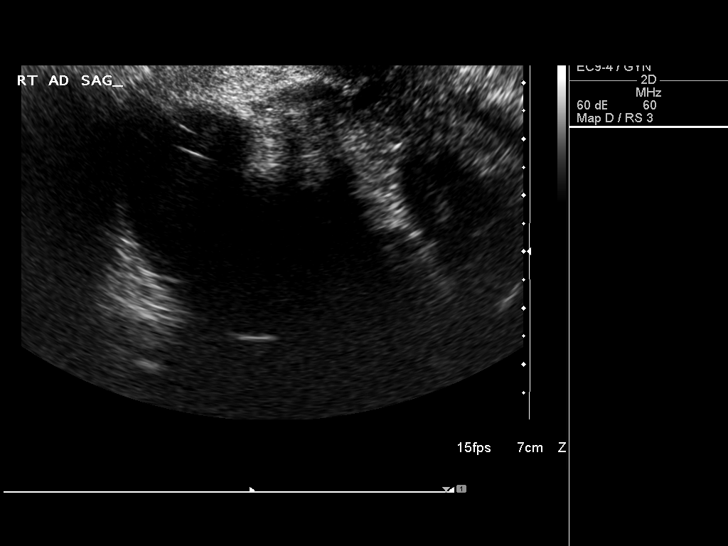
[im 31/58]
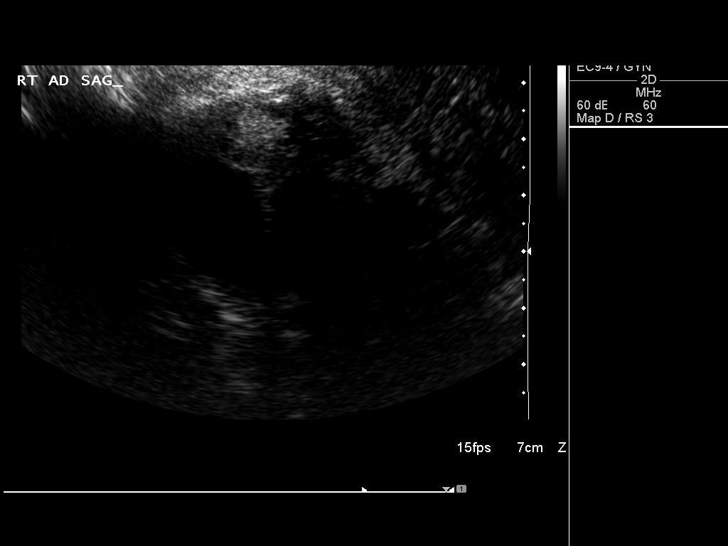
[im 36/58]
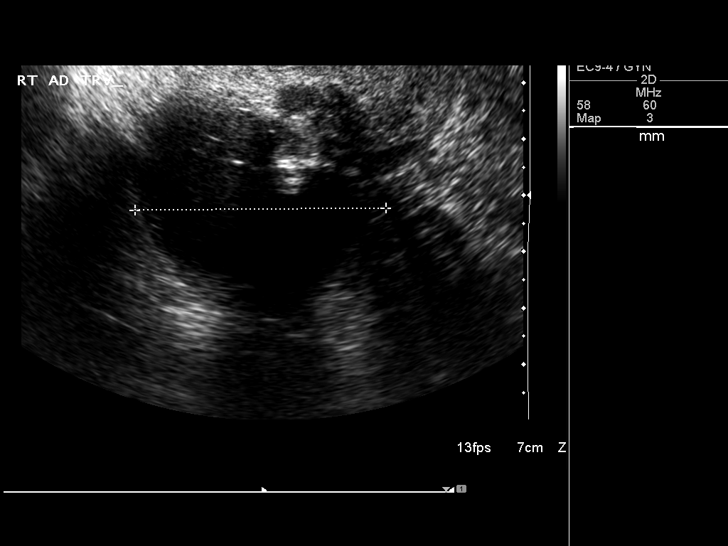
[im 39/58]
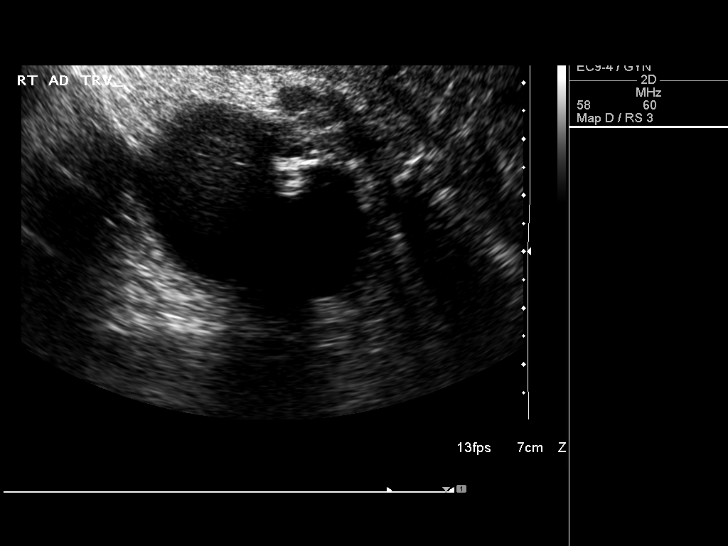
[im 43/58]
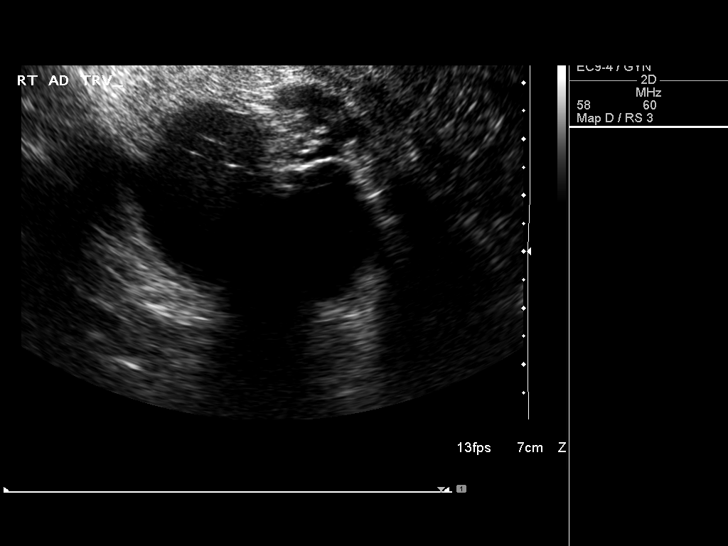
[im 48/58]
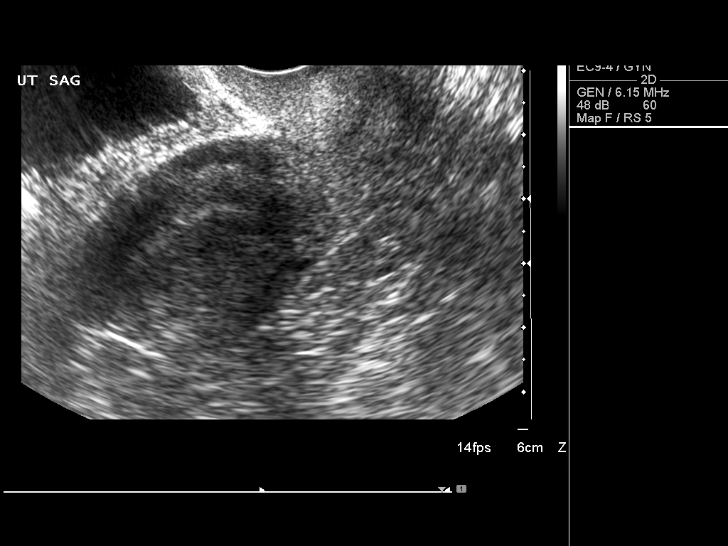
[im 53/58]
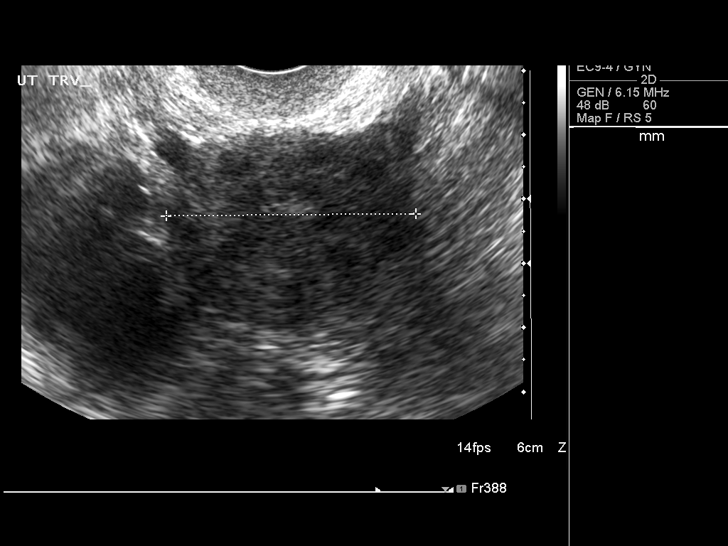
[im 58/58]
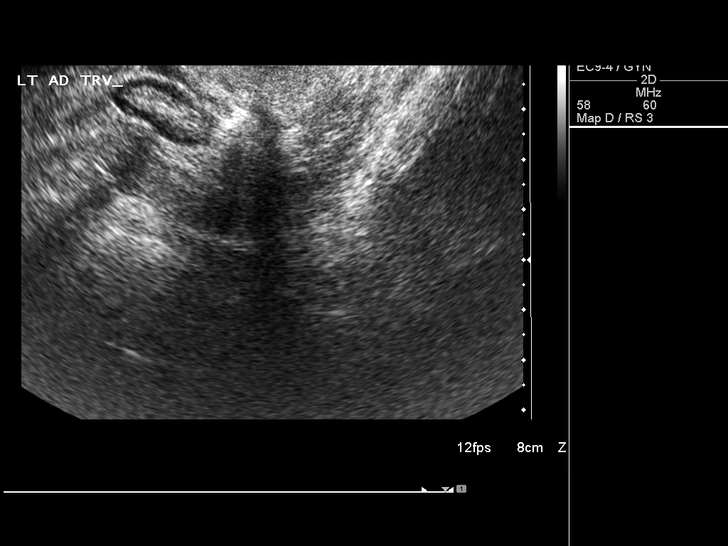

[14 of 25 positions shown; findings below may reference images not displayed]

FINDINGS: Uterus

Measurements: 7.7 x 2.5 x 4.5 cm. No fibroids or other mass
visualized.

Endometrium

Thickness: 1.7 mm P.  No focal abnormality visualized.

Right ovary

Right ovary is difficult to visualize. 5.0 x 5.1 x 4.5 cm complex
right adnexal mass with focal areas of increased density and
shadowing. Given MRI characteristics this is most likely a dermoid.
And ovarian malignancy cannot be entirely excluded.

Left ovary

Not visualized.

Other findings

No free fluid.
IMPRESSION: 5.0 x 5.1 x 4.5 cm right adnexal mass with focal areas of increased
density and shadowing. Given MRI characteristics this is is most
likely a dermoid. Ovarian malignancy cannot be completely excluded.

## 2016-05-28 ENCOUNTER — Ambulatory Visit: Payer: Self-pay | Admitting: Orthopedic Surgery

## 2016-05-28 NOTE — H&P (Signed)
TOTAL HIP REVISION ADMISSION H&P  Patient is admitted for left revision total hip arthroplasty.  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 The indications for the revision total hip arthroplasty are fracture or mechanical failure of one or more component.  Onset of symptoms was abrupt starting 1 years ago with rapidlly worsening course since that time.  Prior procedures on the left hip include arthroplasty.  Patient currently rates pain in the left hip at 10 out of 10 with activity.  There is night pain, worsening of pain with activity and weight bearing, pain that interfers with activities of daily living and crepitus. This condition presents safety issues increasing the risk of falls.    There is no current active infection.  Patient Active Problem List   Diagnosis Date Noted  . Pelvic mass 06/15/2015  . Closed wedge compression fracture of fifth lumbar vertebra ([place])   . Cervical spondylosis 02/09/2014   Past Medical History:  Diagnosis Date  . Arthritis   . Bronchitis    hx of  . Fever blister    Takes Acyclovir  . GERD (gastroesophageal reflux disease)   . Hyperlipemia   . Hypothyroidism   . Osteopenia   . Pneumonia    hx of  . PONV (postoperative nausea and vomiting)   . Seasonal allergies     Past Surgical History:  Procedure Laterality Date  . ANTERIOR CERVICAL DECOMP/DISCECTOMY FUSION N/A 02/09/2014   Procedure: ANTERIOR CERVICAL DECOMPRESSION/DISCECTOMY FUSION 2 LEVELS;  Surgeon: [name], MD;  Location: [facility];  Service: Orthopedics;  Laterality: N/A;  C4-5, C5-6 Anterior Cervical Discectomy and Fusion, Allograft, Plate  . BACK SURGERY     lumbar fusion  . COLONOSCOPY    . GANGLION CYST EXCISION Left  1970s   wrist  . HIP ARTHROPLASTY Left 2011  . JOINT REPLACEMENT    . KNEE SURGERY Right   . ROBOTIC ASSISTED BILATERAL SALPINGO OOPHERECTOMY Bilateral 08/01/2015   Procedure: ROBOTIC ASSISTED BILATERAL SALPINGO OOPHORECTOMY;  Surgeon: Everitt Amber, MD;  Location: [facility];  Service: Gynecology;  Laterality: Bilateral;  . SHOULDER OPEN ROTATOR CUFF REPAIR Right   . SHOULDER SURGERY Right      (Not in a hospital admission) Allergies  Allergen Reactions  . Sulfa Antibiotics Other (See Comments)    Mother said "I liked to have died"  . Alendronate Diarrhea  . Simvastatin Other (See Comments)    Fatty liver disease  . Fenofibrate Rash    She didn't feel right    Social History  Substance Use Topics  . Smoking status: Never Smoker  . Smokeless tobacco: Never Used  . Alcohol use Yes     Comment: rare    Family History  Problem Relation Age of Onset  . Lymphoma Mother       Review of Systems  Constitutional: Positive for diaphoresis.  HENT: Positive for tinnitus.   Eyes: Negative.   Cardiovascular: Negative.   Gastrointestinal: Negative.   Musculoskeletal: Positive for back pain and joint pain.  Skin: Negative.   Neurological: Negative.   Endo/Heme/Allergies: Negative.   Psychiatric/Behavioral: Negative.     Objective:  Physical Exam  Vitals reviewed. Constitutional: She is oriented to person, place, and time. She appears well-developed and well-nourished.  HENT:  Head: Normocephalic and atraumatic.  Eyes: Conjunctivae and EOM are normal. Pupils are equal, round, and reactive to light.  Neck: Normal range of motion. Neck supple.  Cardiovascular: Normal rate, regular rhythm and intact distal pulses.   Respiratory: Effort normal. No respiratory distress.  GI: Soft. She exhibits no distension.  Genitourinary:  Genitourinary Comments: deferred  Musculoskeletal:       Left hip: She exhibits decreased range of motion, decreased strength and crepitus.        Legs: Neurological: She is alert and oriented to person, place, and time. She has normal reflexes.  Skin: Skin is warm and dry.  Psychiatric: She has a normal mood and affect. Her behavior is normal. Judgment and thought content normal.    Vital signs in last 24 hours: @VSRANGES @   Labs:   Estimated body mass index is 31.62 kg/m as calculated from the following:   Height as of 09/01/15: 5\' 5"  (1.651 m).   Weight as of 09/01/15: 86.2 kg (190 lb).  Imaging Review:  Plain radiographs demonstrate displacement of the liner. There is evidence of failure of the liner.The bone quality appears to be adequate for age and reported activity level.  Assessment/Plan:  End stage arthritis, left hip(s) with failed previous arthroplasty.  The patient history, physical examination, clinical judgement of the provider and imaging studies are consistent with end stage degenerative joint disease of the left hip(s), previous total hip arthroplasty. Revision total hip arthroplasty is deemed medically necessary. The treatment options including medical management, injection therapy, arthroscopy and arthroplasty were discussed at length. The risks and benefits of total hip arthroplasty were presented and reviewed. The risks due to aseptic loosening, infection, stiffness, dislocation/subluxation,  thromboembolic complications and other imponderables were discussed.  The patient acknowledged the explanation, agreed to proceed with the plan and consent was signed. Patient is being admitted for inpatient treatment for surgery, pain control, PT, OT, prophylactic antibiotics, VTE prophylaxis, progressive ambulation and ADL's and discharge planning. The patient is planning to be discharged home with home health services

## 2016-05-29 ENCOUNTER — Encounter (HOSPITAL_COMMUNITY): Payer: Self-pay

## 2016-05-30 ENCOUNTER — Encounter (HOSPITAL_COMMUNITY): Payer: Self-pay

## 2016-05-30 ENCOUNTER — Encounter (HOSPITAL_COMMUNITY)
Admission: RE | Admit: 2016-05-30 | Discharge: 2016-05-30 | Disposition: A | Discharge status: Home / Self Care | Payer: Medicare Other | Source: Ambulatory Visit | Attending: Orthopedic Surgery | Admitting: Orthopedic Surgery

## 2016-05-30 DIAGNOSIS — Z01812 Encounter for preprocedural laboratory examination: Secondary | ICD-10-CM | POA: Insufficient documentation

## 2016-05-30 DIAGNOSIS — T84091D Other mechanical complication of internal left hip prosthesis, subsequent encounter: Secondary | ICD-10-CM | POA: Insufficient documentation

## 2016-05-30 HISTORY — DX: Fatty (change of) liver, not elsewhere classified: K76.0

## 2016-05-30 LAB — SURGICAL PCR SCREEN
MRSA, PCR: NEGATIVE
Staphylococcus aureus: POSITIVE — AB

## 2016-05-30 LAB — TYPE AND SCREEN
ABO/RH(D): A POS
Antibody Screen: NEGATIVE

## 2016-05-30 LAB — COMPREHENSIVE METABOLIC PANEL
ALBUMIN: 3.5 g/dL (ref 3.5–5.0)
ALK PHOS: 144 U/L — AB (ref 38–126)
ALT: 53 U/L (ref 14–54)
AST: 68 U/L — AB (ref 15–41)
Anion gap: 7 (ref 5–15)
BILIRUBIN TOTAL: 0.6 mg/dL (ref 0.3–1.2)
BUN: 10 mg/dL (ref 6–20)
CALCIUM: 10.1 mg/dL (ref 8.9–10.3)
CO2: 25 mmol/L (ref 22–32)
CREATININE: 0.7 mg/dL (ref 0.44–1.00)
Chloride: 107 mmol/L (ref 101–111)
GFR calc Af Amer: >60 mL/min (ref >60)
GLUCOSE: 93 mg/dL (ref 65–99)
Potassium: 4.9 mmol/L (ref 3.5–5.1)
Sodium: 139 mmol/L (ref 135–145)
TOTAL PROTEIN: 6.9 g/dL (ref 6.5–8.1)

## 2016-05-30 LAB — CBC
HEMATOCRIT: 43.1 % (ref 36.0–46.0)
HEMOGLOBIN: 13.9 g/dL (ref 12.0–15.0)
MCH: 31.7 pg (ref 26.0–34.0)
MCHC: 32.3 g/dL (ref 30.0–36.0)
MCV: 98.2 fL (ref 78.0–100.0)
Platelets: 265 10*3/uL (ref 150–400)
RBC: 4.39 MIL/uL (ref 3.87–5.11)
RDW: 14 % (ref 11.5–15.5)
WBC: 8.8 10*3/uL (ref 4.0–10.5)

## 2016-05-30 LAB — ABO/RH: ABO/RH(D): A POS

## 2016-05-30 NOTE — Pre-Procedure Instructions (Signed)
Emma Lewis  05/30/2016      [id number] - [place], [place] [address] [place] [address] Phone: [phone] Fax: [phone]    Your procedure is scheduled on   Monday 06/10/16  Report to [facility] Admitting at 530 A.M.  Call this number if you have problems the morning of surgery:  [phone]   Remember:  Do not eat food or drink liquids after midnight.  Take these medicines the morning of surgery with A SIP OF WATER  ACYCLOVIR, HYDROCODONE IF NEEDED, LEVOTHYROXINE, OMEPRAZOLE (PRILOSEC)        (STOP 7 DAYS PRIOR TO SURGERY- ASPIRIN OR ASPIRIN PRODUCTS, IBUPROFEN/ ADVIL/ MOTRIN, ALEVE, GOODY POWDERS/ BC'S, LIDOCAINE (ASPERCREME), FISH OIL , HERBAL MEDICINES, VITAMINS)   Do not wear jewelry, make-up or nail polish.  Do not wear lotions, powders, or perfumes, or deoderant.  Do not shave 48 hours prior to surgery.  Men may shave face and neck.  Do not bring valuables to the hospital.  [facility] is not responsible for any belongings or valuables.  Contacts, dentures or bridgework may not be worn into surgery.  Leave your suitcase in the car.  After surgery it may be brought to your room.  For patients admitted to the hospital, discharge time will be determined by your treatment team.  Patients discharged the day of surgery will not be allowed to drive home.   Name and phone number of your driver:    Special instructions:  [place] - Preparing for Surgery  Before surgery, you can play an important role.  Because skin is not sterile, your skin needs to be as free of germs as possible.  You can reduce the number of germs on you skin by washing with CHG (chlorahexidine gluconate) soap before surgery.  CHG is an antiseptic cleaner which kills germs and bonds with the skin to continue killing germs even after washing.  Please DO NOT use if you have an allergy to CHG or antibacterial soaps.   If your skin becomes reddened/irritated stop using the CHG and inform your nurse when you arrive at Short Stay.  Do not shave (including legs and underarms) for at least 48 hours prior to the first CHG shower.  You may shave your face.  Please follow these instructions carefully:   1.  Shower with CHG Soap the night before surgery and the                                morning of Surgery.  2.  If you choose to wash your hair, wash your hair first as usual with your       normal shampoo.  3.  After you shampoo, rinse your hair and body thoroughly to remove the                      Shampoo.  4.  Use CHG as you would any other liquid soap.  You can apply chg directly       to the skin and wash gently with scrungie or a clean washcloth.  5.  Apply the CHG Soap to your body ONLY FROM THE NECK DOWN.        Do not use on open wounds or open sores.  Avoid contact with your  eyes,       ears, mouth and genitals (private parts).  Wash genitals (private parts)       with your normal soap.  6.  Wash thoroughly, paying special attention to the area where your surgery        will be performed.  7.  Thoroughly rinse your body with warm water from the neck down.  8.  DO NOT shower/wash with your normal soap after using and rinsing off       the CHG Soap.  9.  Pat yourself dry with a clean towel.            10.  Wear clean pajamas.            11.  Place clean sheets on your bed the night of your first shower and do not        sleep with pets.  Day of Surgery  Do not apply any lotions/deoderants the morning of surgery.  Please wear clean clothes to the hospital/surgery center.    Please read over the following fact sheets that you were given. MRSA Information and Surgical Site Infection Prevention

## 2016-06-07 MED ORDER — TRANEXAMIC ACID 1000 MG/10ML IV SOLN
1000.0000 mg | INTRAVENOUS | Status: AC
  Administered 2016-06-10: 1000 mg via INTRAVENOUS
  Filled 2016-06-07: qty 10

## 2016-06-10 ENCOUNTER — Inpatient Hospital Stay (HOSPITAL_COMMUNITY): Payer: Medicare Other

## 2016-06-10 ENCOUNTER — Inpatient Hospital Stay (HOSPITAL_COMMUNITY)
Admission: RE | Admit: 2016-06-10 | Discharge: 2016-06-11 | DRG: 468 | Disposition: A | Discharge status: Home Health | Payer: Medicare Other | Source: Ambulatory Visit | Attending: Orthopedic Surgery | Admitting: Orthopedic Surgery

## 2016-06-10 ENCOUNTER — Encounter (HOSPITAL_COMMUNITY): Payer: Self-pay | Admitting: *Deleted

## 2016-06-10 ENCOUNTER — Encounter (HOSPITAL_COMMUNITY)
Admission: RE | Disposition: A | Discharge status: Home Health | Payer: Self-pay | Source: Ambulatory Visit | Attending: Orthopedic Surgery

## 2016-06-10 ENCOUNTER — Inpatient Hospital Stay (HOSPITAL_COMMUNITY): Payer: Medicare Other | Admitting: Anesthesiology

## 2016-06-10 DIAGNOSIS — E039 Hypothyroidism, unspecified: Secondary | ICD-10-CM | POA: Diagnosis present

## 2016-06-10 DIAGNOSIS — T84061A Wear of articular bearing surface of internal prosthetic left hip joint, initial encounter: Secondary | ICD-10-CM | POA: Diagnosis present

## 2016-06-10 DIAGNOSIS — M8580 Other specified disorders of bone density and structure, unspecified site: Secondary | ICD-10-CM | POA: Diagnosis present

## 2016-06-10 DIAGNOSIS — Z6832 Body mass index (BMI) 32.0-32.9, adult: Secondary | ICD-10-CM

## 2016-06-10 DIAGNOSIS — T84018A Broken internal joint prosthesis, other site, initial encounter: Secondary | ICD-10-CM

## 2016-06-10 DIAGNOSIS — Z96649 Presence of unspecified artificial hip joint: Secondary | ICD-10-CM

## 2016-06-10 DIAGNOSIS — T84021A Dislocation of internal left hip prosthesis, initial encounter: Secondary | ICD-10-CM | POA: Diagnosis present

## 2016-06-10 DIAGNOSIS — R1900 Intra-abdominal and pelvic swelling, mass and lump, unspecified site: Secondary | ICD-10-CM | POA: Diagnosis not present

## 2016-06-10 DIAGNOSIS — Y792 Prosthetic and other implants, materials and accessory orthopedic devices associated with adverse incidents: Secondary | ICD-10-CM | POA: Diagnosis present

## 2016-06-10 DIAGNOSIS — E785 Hyperlipidemia, unspecified: Secondary | ICD-10-CM | POA: Diagnosis present

## 2016-06-10 DIAGNOSIS — K219 Gastro-esophageal reflux disease without esophagitis: Secondary | ICD-10-CM | POA: Diagnosis present

## 2016-06-10 DIAGNOSIS — Z981 Arthrodesis status: Secondary | ICD-10-CM

## 2016-06-10 DIAGNOSIS — Z09 Encounter for follow-up examination after completed treatment for conditions other than malignant neoplasm: Secondary | ICD-10-CM

## 2016-06-10 DIAGNOSIS — Z888 Allergy status to other drugs, medicaments and biological substances status: Secondary | ICD-10-CM

## 2016-06-10 DIAGNOSIS — Z882 Allergy status to sulfonamides status: Secondary | ICD-10-CM | POA: Diagnosis not present

## 2016-06-10 DIAGNOSIS — T84091A Other mechanical complication of internal left hip prosthesis, initial encounter: Secondary | ICD-10-CM | POA: Diagnosis not present

## 2016-06-10 DIAGNOSIS — Z471 Aftercare following joint replacement surgery: Secondary | ICD-10-CM | POA: Diagnosis not present

## 2016-06-10 DIAGNOSIS — M47812 Spondylosis without myelopathy or radiculopathy, cervical region: Secondary | ICD-10-CM | POA: Diagnosis not present

## 2016-06-10 DIAGNOSIS — Z96642 Presence of left artificial hip joint: Secondary | ICD-10-CM | POA: Diagnosis not present

## 2016-06-10 DIAGNOSIS — S32050A Wedge compression fracture of fifth lumbar vertebra, initial encounter for closed fracture: Secondary | ICD-10-CM | POA: Diagnosis not present

## 2016-06-10 HISTORY — PX: TOTAL HIP REVISION: SHX763

## 2016-06-10 SURGERY — TOTAL HIP REVISION
Anesthesia: Monitor Anesthesia Care | Site: Hip | Laterality: Left

## 2016-06-10 MED ORDER — ONDANSETRON HCL 4 MG/2ML IJ SOLN
INTRAMUSCULAR | Status: DC | PRN
  Administered 2016-06-10: 4 mg via INTRAVENOUS

## 2016-06-10 MED ORDER — ALBUMIN HUMAN 5 % IV SOLN
INTRAVENOUS | Status: DC | PRN
  Administered 2016-06-10: 09:00:00 via INTRAVENOUS

## 2016-06-10 MED ORDER — KETOROLAC TROMETHAMINE 15 MG/ML IJ SOLN
7.5000 mg | Freq: Four times a day (QID) | INTRAMUSCULAR | Status: AC
  Administered 2016-06-10 – 2016-06-11 (×4): 7.5 mg via INTRAVENOUS
  Filled 2016-06-10 (×4): qty 1

## 2016-06-10 MED ORDER — LACTATED RINGERS IV SOLN
INTRAVENOUS | Status: DC | PRN
  Administered 2016-06-10 (×2): via INTRAVENOUS

## 2016-06-10 MED ORDER — KETOROLAC TROMETHAMINE 30 MG/ML IJ SOLN
INTRAMUSCULAR | Status: DC | PRN
  Administered 2016-06-10: 30 mg via INTRA_ARTICULAR

## 2016-06-10 MED ORDER — PHENYLEPHRINE HCL 10 MG/ML IJ SOLN
INTRAMUSCULAR | Status: DC | PRN
  Administered 2016-06-10: 15 ug/min via INTRAVENOUS

## 2016-06-10 MED ORDER — CHLORHEXIDINE GLUCONATE 4 % EX LIQD: 60.0000 mL | Freq: Once | CUTANEOUS | Status: DC

## 2016-06-10 MED ORDER — KETOROLAC TROMETHAMINE 30 MG/ML IJ SOLN
INTRAMUSCULAR | Status: AC
  Filled 2016-06-10: qty 1

## 2016-06-10 MED ORDER — POVIDONE-IODINE 10 % EX SWAB: 2.0000 "application " | Freq: Once | CUTANEOUS | Status: DC

## 2016-06-10 MED ORDER — ACETAMINOPHEN 650 MG RE SUPP: 650.0000 mg | Freq: Four times a day (QID) | RECTAL | Status: DC | PRN

## 2016-06-10 MED ORDER — TRANEXAMIC ACID 1000 MG/10ML IV SOLN
1000.0000 mg | Freq: Once | INTRAVENOUS | Status: AC
  Administered 2016-06-10: 1000 mg via INTRAVENOUS
  Filled 2016-06-10: qty 10

## 2016-06-10 MED ORDER — ACETAMINOPHEN 325 MG PO TABS: 650.0000 mg | ORAL_TABLET | Freq: Four times a day (QID) | ORAL | Status: DC | PRN

## 2016-06-10 MED ORDER — LEVOTHYROXINE SODIUM 75 MCG PO TABS
75.0000 ug | ORAL_TABLET | Freq: Every day | ORAL | Status: DC
  Administered 2016-06-11: 75 ug via ORAL
  Filled 2016-06-10: qty 1

## 2016-06-10 MED ORDER — ONDANSETRON HCL 4 MG PO TABS: 4.0000 mg | ORAL_TABLET | Freq: Four times a day (QID) | ORAL | Status: DC | PRN

## 2016-06-10 MED ORDER — PHENYLEPHRINE 40 MCG/ML (10ML) SYRINGE FOR IV PUSH (FOR BLOOD PRESSURE SUPPORT)
PREFILLED_SYRINGE | INTRAVENOUS | Status: AC
  Filled 2016-06-10: qty 10

## 2016-06-10 MED ORDER — BUPIVACAINE HCL (PF) 0.5 % IJ SOLN
INTRAMUSCULAR | Status: DC | PRN
  Administered 2016-06-10: 30 mL via INTRA_ARTICULAR

## 2016-06-10 MED ORDER — SENNA 8.6 MG PO TABS
2.0000 | ORAL_TABLET | Freq: Every day | ORAL | Status: DC
  Administered 2016-06-10: 17.2 mg via ORAL
  Filled 2016-06-10: qty 2

## 2016-06-10 MED ORDER — ONDANSETRON HCL 4 MG/2ML IJ SOLN
INTRAMUSCULAR | Status: AC
  Filled 2016-06-10: qty 2

## 2016-06-10 MED ORDER — FENTANYL CITRATE (PF) 100 MCG/2ML IJ SOLN
INTRAMUSCULAR | Status: DC | PRN
  Administered 2016-06-10 (×4): 50 ug via INTRAVENOUS

## 2016-06-10 MED ORDER — SODIUM CHLORIDE 0.9 % IR SOLN
Status: DC | PRN
  Administered 2016-06-10: 3000 mL

## 2016-06-10 MED ORDER — OXYCODONE HCL 5 MG PO TABS: 5.0000 mg | ORAL_TABLET | Freq: Once | ORAL | Status: DC | PRN

## 2016-06-10 MED ORDER — METHOCARBAMOL 1000 MG/10ML IJ SOLN: 500.0000 mg | Freq: Four times a day (QID) | INTRAVENOUS | Status: DC | PRN

## 2016-06-10 MED ORDER — BUPIVACAINE HCL (PF) 0.5 % IJ SOLN
INTRAMUSCULAR | Status: DC | PRN
  Administered 2016-06-10: 3 mL via INTRATHECAL

## 2016-06-10 MED ORDER — APIXABAN 2.5 MG PO TABS
2.5000 mg | ORAL_TABLET | Freq: Two times a day (BID) | ORAL | Status: DC
  Administered 2016-06-11: 2.5 mg via ORAL
  Filled 2016-06-10: qty 1

## 2016-06-10 MED ORDER — 0.9 % SODIUM CHLORIDE (POUR BTL) OPTIME
TOPICAL | Status: DC | PRN
  Administered 2016-06-10: 1000 mL

## 2016-06-10 MED ORDER — OXYCODONE HCL 5 MG/5ML PO SOLN: 5.0000 mg | Freq: Once | ORAL | Status: DC | PRN

## 2016-06-10 MED ORDER — FENTANYL CITRATE (PF) 100 MCG/2ML IJ SOLN
INTRAMUSCULAR | Status: AC
  Filled 2016-06-10: qty 4

## 2016-06-10 MED ORDER — METHOCARBAMOL 500 MG PO TABS
500.0000 mg | ORAL_TABLET | Freq: Four times a day (QID) | ORAL | Status: DC | PRN
  Administered 2016-06-10 – 2016-06-11 (×3): 500 mg via ORAL
  Filled 2016-06-10 (×3): qty 1

## 2016-06-10 MED ORDER — DOCUSATE SODIUM 100 MG PO CAPS
100.0000 mg | ORAL_CAPSULE | Freq: Two times a day (BID) | ORAL | Status: DC
  Administered 2016-06-10 – 2016-06-11 (×3): 100 mg via ORAL
  Filled 2016-06-10 (×3): qty 1

## 2016-06-10 MED ORDER — HYDROCODONE-ACETAMINOPHEN 7.5-325 MG PO TABS
1.0000 | ORAL_TABLET | ORAL | Status: DC | PRN
  Administered 2016-06-10: 2 via ORAL
  Administered 2016-06-11: 1 via ORAL
  Administered 2016-06-11: 2 via ORAL
  Filled 2016-06-10: qty 1
  Filled 2016-06-10 (×2): qty 2

## 2016-06-10 MED ORDER — PANTOPRAZOLE SODIUM 40 MG PO TBEC
40.0000 mg | DELAYED_RELEASE_TABLET | Freq: Every day | ORAL | Status: DC
  Administered 2016-06-11: 40 mg via ORAL
  Filled 2016-06-10: qty 1

## 2016-06-10 MED ORDER — MIDAZOLAM HCL 2 MG/2ML IJ SOLN
INTRAMUSCULAR | Status: AC
  Filled 2016-06-10: qty 2

## 2016-06-10 MED ORDER — PHENYLEPHRINE HCL 10 MG/ML IJ SOLN
INTRAMUSCULAR | Status: DC | PRN
  Administered 2016-06-10: 80 ug via INTRAVENOUS

## 2016-06-10 MED ORDER — HYDROMORPHONE HCL 2 MG/ML IJ SOLN
0.5000 mg | INTRAMUSCULAR | Status: DC | PRN
Start: 2016-06-10 — End: 2016-06-11

## 2016-06-10 MED ORDER — PROPOFOL 500 MG/50ML IV EMUL
INTRAVENOUS | Status: DC | PRN
  Administered 2016-06-10: 40 ug/kg/min via INTRAVENOUS

## 2016-06-10 MED ORDER — MENTHOL 3 MG MT LOZG: 1.0000 | LOZENGE | OROMUCOSAL | Status: DC | PRN

## 2016-06-10 MED ORDER — EPHEDRINE SULFATE 50 MG/ML IJ SOLN
INTRAMUSCULAR | Status: DC | PRN
  Administered 2016-06-10: 10 mg via INTRAVENOUS
  Administered 2016-06-10: 5 mg via INTRAVENOUS

## 2016-06-10 MED ORDER — HYDROMORPHONE HCL 1 MG/ML IJ SOLN: 0.2500 mg | INTRAMUSCULAR | Status: DC | PRN

## 2016-06-10 MED ORDER — DEXAMETHASONE SODIUM PHOSPHATE 10 MG/ML IJ SOLN
10.0000 mg | Freq: Once | INTRAMUSCULAR | Status: AC
  Administered 2016-06-11: 10 mg via INTRAVENOUS
  Filled 2016-06-10: qty 1

## 2016-06-10 MED ORDER — PROPOFOL 500 MG/50ML IV EMUL
INTRAVENOUS | Status: AC
  Filled 2016-06-10: qty 100

## 2016-06-10 MED ORDER — EPHEDRINE 5 MG/ML INJ
INTRAVENOUS | Status: AC
  Filled 2016-06-10: qty 10

## 2016-06-10 MED ORDER — METOCLOPRAMIDE HCL 5 MG/ML IJ SOLN: 5.0000 mg | Freq: Three times a day (TID) | INTRAMUSCULAR | Status: DC | PRN

## 2016-06-10 MED ORDER — SODIUM CHLORIDE 0.9 % IV SOLN: INTRAVENOUS | Status: DC

## 2016-06-10 MED ORDER — SODIUM CHLORIDE 0.9 % IJ SOLN
INTRAMUSCULAR | Status: DC | PRN
  Administered 2016-06-10: 30 mL

## 2016-06-10 MED ORDER — VANCOMYCIN HCL IN DEXTROSE 1-5 GM/200ML-% IV SOLN
1000.0000 mg | Freq: Two times a day (BID) | INTRAVENOUS | Status: AC
  Administered 2016-06-10: 1000 mg via INTRAVENOUS
  Filled 2016-06-10: qty 200

## 2016-06-10 MED ORDER — LIDOCAINE HCL (CARDIAC) 20 MG/ML IV SOLN
INTRAVENOUS | Status: DC | PRN
  Administered 2016-06-10: 40 mg via INTRAVENOUS

## 2016-06-10 MED ORDER — PROPOFOL 1000 MG/100ML IV EMUL
INTRAVENOUS | Status: AC
  Filled 2016-06-10: qty 200

## 2016-06-10 MED ORDER — METOCLOPRAMIDE HCL 5 MG PO TABS: 5.0000 mg | ORAL_TABLET | Freq: Three times a day (TID) | ORAL | Status: DC | PRN

## 2016-06-10 MED ORDER — POLYETHYLENE GLYCOL 3350 17 G PO PACK: 17.0000 g | PACK | Freq: Every day | ORAL | Status: DC | PRN

## 2016-06-10 MED ORDER — PROPOFOL 10 MG/ML IV BOLUS
INTRAVENOUS | Status: AC
  Filled 2016-06-10: qty 40

## 2016-06-10 MED ORDER — ONDANSETRON HCL 4 MG/2ML IJ SOLN: 4.0000 mg | Freq: Four times a day (QID) | INTRAMUSCULAR | Status: DC | PRN

## 2016-06-10 MED ORDER — SIMVASTATIN 40 MG PO TABS
40.0000 mg | ORAL_TABLET | Freq: Every day | ORAL | Status: DC
  Administered 2016-06-10: 40 mg via ORAL
  Filled 2016-06-10: qty 1

## 2016-06-10 MED ORDER — CEFAZOLIN SODIUM-DEXTROSE 2-4 GM/100ML-% IV SOLN
2.0000 g | INTRAVENOUS | Status: AC
  Administered 2016-06-10: 2 g via INTRAVENOUS

## 2016-06-10 MED ORDER — POLYVINYL ALCOHOL 1.4 % OP SOLN
1.0000 [drp] | Freq: Every day | OPHTHALMIC | Status: DC | PRN
  Filled 2016-06-10: qty 15

## 2016-06-10 MED ORDER — MAGNESIUM OXIDE 400 (241.3 MG) MG PO TABS
200.0000 mg | ORAL_TABLET | Freq: Three times a day (TID) | ORAL | Status: DC
  Administered 2016-06-10: 200 mg via ORAL
  Filled 2016-06-10 (×3): qty 1

## 2016-06-10 MED ORDER — BUPIVACAINE HCL (PF) 0.5 % IJ SOLN
INTRAMUSCULAR | Status: AC
  Filled 2016-06-10: qty 30

## 2016-06-10 MED ORDER — SODIUM CHLORIDE 0.9 % IV SOLN
INTRAVENOUS | Status: DC
  Administered 2016-06-10: 11:00:00 via INTRAVENOUS

## 2016-06-10 MED ORDER — ACYCLOVIR 400 MG PO TABS
400.0000 mg | ORAL_TABLET | Freq: Every day | ORAL | Status: DC
  Administered 2016-06-11: 400 mg via ORAL
  Filled 2016-06-10: qty 1

## 2016-06-10 MED ORDER — PHENOL 1.4 % MT LIQD
1.0000 | OROMUCOSAL | Status: DC | PRN
Start: 2016-06-10 — End: 2016-06-11

## 2016-06-10 MED ORDER — DIPHENHYDRAMINE HCL 12.5 MG/5ML PO ELIX
12.5000 mg | ORAL_SOLUTION | ORAL | Status: DC | PRN
Start: 2016-06-10 — End: 2016-06-11

## 2016-06-10 MED ORDER — MIDAZOLAM HCL 5 MG/5ML IJ SOLN
INTRAMUSCULAR | Status: DC | PRN
  Administered 2016-06-10 (×2): 1 mg via INTRAVENOUS

## 2016-06-10 SURGICAL SUPPLY — 71 items
ADH SKN CLS APL DERMABOND .7 (GAUZE/BANDAGES/DRESSINGS) ×2
BAG DECANTER FOR FLEXI CONT (MISCELLANEOUS) ×2 IMPLANT
BIT DRILL RINGLOC QUICK CONN (BIT) IMPLANT
BLADE EXPLANT 54 LONG (BLADE) ×1 IMPLANT
BLADE EXPLANT TRUN SM 52 (BLADE) ×1 IMPLANT
BLADE SAW SGTL 18X1.27X75 (BLADE) ×1 IMPLANT
CHLORAPREP W/TINT 26ML (MISCELLANEOUS) ×3 IMPLANT
COVER SURGICAL LIGHT HANDLE (MISCELLANEOUS) ×2 IMPLANT
DERMABOND ADVANCED (GAUZE/BANDAGES/DRESSINGS) ×2
DERMABOND ADVANCED .7 DNX12 (GAUZE/BANDAGES/DRESSINGS) ×1 IMPLANT
DRAPE HALF SHEET 40X57 (DRAPES) ×1 IMPLANT
DRAPE HIP W/POCKET STRL (DRAPE) ×2 IMPLANT
DRAPE INCISE IOBAN 66X45 STRL (DRAPES) ×2 IMPLANT
DRAPE INCISE IOBAN 85X60 (DRAPES) ×2 IMPLANT
DRAPE POUCH INSTRU U-SHP 10X18 (DRAPES) ×2 IMPLANT
DRAPE SURG 17X11 SM STRL (DRAPES) ×2 IMPLANT
DRAPE U-SHAPE 47X51 STRL (DRAPES) ×2 IMPLANT
DRILL BIT RINGLOC QUICK CONN (BIT) ×2
DRSG AQUACEL AG ADV 3.5X10 (GAUZE/BANDAGES/DRESSINGS) ×2 IMPLANT
ELECT BLADE 4.0 EZ CLEAN MEGAD (MISCELLANEOUS) ×2
ELECT BLADE TIP CTD 4 INCH (ELECTRODE) ×2 IMPLANT
ELECT REM PT RETURN 9FT ADLT (ELECTROSURGICAL) ×2
ELECTRODE BLDE 4.0 EZ CLN MEGD (MISCELLANEOUS) IMPLANT
ELECTRODE REM PT RTRN 9FT ADLT (ELECTROSURGICAL) ×1 IMPLANT
FACESHIELD WRAPAROUND (MASK) ×8 IMPLANT
FACESHIELD WRAPAROUND OR TEAM (MASK) ×4 IMPLANT
GLOVE BIO SURGEON STRL SZ8.5 (GLOVE) ×2 IMPLANT
GLOVE BIOGEL PI IND STRL 6.5 (GLOVE) IMPLANT
GLOVE BIOGEL PI IND STRL 8.5 (GLOVE) ×2 IMPLANT
GLOVE BIOGEL PI INDICATOR 6.5 (GLOVE) ×1
GLOVE BIOGEL PI INDICATOR 8.5 (GLOVE) ×2
GLOVE SURG SS PI 6.0 STRL IVOR (GLOVE) ×2 IMPLANT
GLOVE SURG SS PI 8.0 STRL IVOR (GLOVE) ×1 IMPLANT
GOWN SPEC L3 XXLG W/TWL (GOWN DISPOSABLE) ×4 IMPLANT
GOWN STRL REUS W/ TWL LRG LVL3 (GOWN DISPOSABLE) IMPLANT
GOWN STRL REUS W/TWL 2XL LVL3 (GOWN DISPOSABLE) ×3 IMPLANT
GOWN STRL REUS W/TWL LRG LVL3 (GOWN DISPOSABLE) ×2
HANDPIECE INTERPULSE COAX TIP (DISPOSABLE) ×2
HEAD FEMORAL LINEAGE SIZE 36MM (Joint) ×1 IMPLANT
HIP ACETABULAR SCREW 6.5X20MM (Hips) ×2 IMPLANT
HIP ACETABULAR SCREW 6.5X25MM (Hips) ×2 IMPLANT
HOOD PEEL AWAY FLYTE STAYCOOL (MISCELLANEOUS) ×2 IMPLANT
KIT BASIN OR (CUSTOM PROCEDURE TRAY) ×2 IMPLANT
LINER ACE +5MM 36MM HIP (Liner) ×1 IMPLANT
MANIFOLD NEPTUNE II (INSTRUMENTS) ×2 IMPLANT
MARKER SKIN DUAL TIP RULER LAB (MISCELLANEOUS) ×1 IMPLANT
NDL SAFETY ECLIPSE 18X1.5 (NEEDLE) ×1 IMPLANT
NEEDLE HYPO 18GX1.5 SHARP (NEEDLE) ×2
NS IRRIG 1000ML POUR BTL (IV SOLUTION) ×2 IMPLANT
PACK TOTAL JOINT (CUSTOM PROCEDURE TRAY) ×2 IMPLANT
SCREW ACETABULAR G7 6.5X35MM (Hips) ×1 IMPLANT
SCREW ACETABULAR HIP 6.5X20MM (Hips) IMPLANT
SCREW ACETABULAR HIP 6.5X25MM (Hips) IMPLANT
SEALER BIPOLAR AQUA 6.0 (INSTRUMENTS) ×1 IMPLANT
SET HNDPC FAN SPRY TIP SCT (DISPOSABLE) IMPLANT
SHELL ACETAB MULTIHOLE 56MM S7 (Hips) ×1 IMPLANT
SUCTION FRAZIER HANDLE 10FR (MISCELLANEOUS) ×1
SUCTION TUBE FRAZIER 10FR DISP (MISCELLANEOUS) ×1 IMPLANT
SUT ETHIBOND 2 V 37 (SUTURE) ×4 IMPLANT
SUT MNCRL AB 3-0 PS2 18 (SUTURE) ×1 IMPLANT
SUT MNCRL AB 4-0 PS2 18 (SUTURE) ×2 IMPLANT
SUT MON AB 2-0 CT1 36 (SUTURE) ×5 IMPLANT
SUT VIC AB 1 CT1 27 (SUTURE) ×4
SUT VIC AB 1 CT1 27XBRD ANBCTR (SUTURE) ×1 IMPLANT
SUT VIC AB 2-0 CT1 27 (SUTURE) ×2
SUT VIC AB 2-0 CT1 TAPERPNT 27 (SUTURE) ×1 IMPLANT
SUT VLOC 180 0 24IN GS25 (SUTURE) ×2 IMPLANT
SYR 50ML LL SCALE MARK (SYRINGE) ×2 IMPLANT
TOWEL OR 17X24 6PK STRL BLUE (TOWEL DISPOSABLE) ×2 IMPLANT
TOWEL OR 17X26 10 PK STRL BLUE (TOWEL DISPOSABLE) ×4 IMPLANT
TRAY CATH 16FR W/PLASTIC CATH (SET/KITS/TRAYS/PACK) IMPLANT

## 2016-06-10 NOTE — Transfer of Care (Signed)
  Immediate Anesthesia Transfer of Care Note  Patient: Emma Lewis  Procedure(s) Performed: Procedure(s): TOTAL HIP REVISION ARTHROPLASTY (Left)  Patient Location: PACU  Anesthesia Type:MAC and Regional  Level of Consciousness: awake, alert , oriented and sedated  Airway & Oxygen Therapy: Patient Spontanous Breathing and Patient connected to nasal cannula oxygen  Post-op Assessment: Report given to RN, Post -op Vital signs reviewed and stable and Patient moving all extremities  Post vital signs: Reviewed and stable  Last Vitals:  Vitals:   06/10/16 0635 06/10/16 1049  BP: (!) 121/51 (!) 98/59  Pulse: 83 81  Resp: 20 18  Temp: 36.7 C     Last Pain:  Vitals:   06/10/16 0635  TempSrc: Oral      Patients Stated Pain Goal: 3 ([id number])  Complications: No apparent anesthesia complications

## 2016-06-10 NOTE — Evaluation (Signed)
Physical Therapy Evaluation Patient Details Name: Emma Lewis MRN: [id number] DOB: 02-11-1951 Today's Date: 06/10/2016   History of Present Illness  Admitted for LTHA revision;  has a past medical history of Arthritis;  GERD (gastroesophageal reflux disease);  Hypothyroidism; Osteopenia;  PONV (postoperative nausea and vomiting);  has a past surgical history that includes  Knee surgery (Right); Shoulder open rotator cuff repair (Right); Shoulder surgery (Right); Joint replacement; Hip Arthroplasty (Left, 2011); Colonoscopy; Anterior cervical decomp/discectomy fusion (N/A, 02/09/2014)  Clinical Impression   Pt is s/p THA resulting in the deficits listed below (see PT Problem List). Overall movign quite well; needs reinforcement of precautions (tending to cross ankles);  Pt will benefit from skilled PT to increase their independence and safety with mobility to allow discharge to the venue listed below.      Follow Up Recommendations Home health PT;Supervision/Assistance - 24 hour    Equipment Recommendations  None recommended by PT    Recommendations for Other Services OT consult     Precautions / Restrictions Precautions Precautions: Posterior Hip Precaution Booklet Issued: Yes (comment) Precaution Comments: Eduacted in Post Prec during functional activity Restrictions Weight Bearing Restrictions: Yes LLE Weight Bearing: Weight bearing as tolerated      Mobility  Bed Mobility Overal bed mobility: Needs Assistance Bed Mobility: Supine to Sit     Supine to sit: Min assist     General bed mobility comments: Cues for technqiue and precautions  Transfers Overall transfer level: Needs assistance Equipment used: Rolling walker (2 wheeled) Transfers: Sit to/from Stand Sit to Stand: Min guard         General transfer comment: Cues for ahnd placement, safety, and posterior hip prec  Ambulation/Gait Ambulation/Gait assistance: Min guard Ambulation Distance (Feet): 150  Feet Assistive device: Rolling walker (2 wheeled) Gait Pattern/deviations: Step-through pattern     General Gait Details: Cues for gait sequence and to observe Post hip prec during turns  Science writer    Modified Rankin (Stroke Patients Only)       Balance                                             Pertinent Vitals/Pain Pain Assessment: 0-10 Pain Score: 4  Pain Location: R hip, anterior aspect Pain Descriptors / Indicators: Aching Pain Intervention(s): Monitored during session    Home Living Family/patient expects to be discharged to:: Private residence Living Arrangements: Spouse/significant other Available Help at Discharge: Family;Available PRN/intermittently Type of Home: House Home Access: Stairs to enter Entrance Stairs-Rails: Psychiatric nurse of Steps: 4 Home Layout: One level Home Equipment: Walker - 2 wheels;Crutches;Bedside commode      Prior Function Level of Independence: Independent               Hand Dominance        Extremity/Trunk Assessment   Upper Extremity Assessment: Overall WFL for tasks assessed           Lower Extremity Assessment: LLE deficits/detail   LLE Deficits / Details: Grossly decr aROM and strength L hip limited by soreness postop     Communication   Communication: No difficulties  Cognition Arousal/Alertness: Awake/alert Behavior During Therapy: WFL for tasks assessed/performed Overall Cognitive Status: Within Functional Limits for tasks assessed  General Comments      Exercises     Assessment/Plan    PT Assessment Patient needs continued PT services  PT Problem List Decreased strength;Decreased range of motion;Decreased activity tolerance;Decreased balance;Decreased mobility;Decreased knowledge of use of DME;Decreased knowledge of precautions;Pain          PT Treatment Interventions DME instruction;Gait  training;Stair training;Functional mobility training;Therapeutic activities;Therapeutic exercise;Patient/family education    PT Goals (Current goals can be found in the Care Plan section)  Acute Rehab PT Goals Patient Stated Goal: wal without pain` PT Goal Formulation: With patient/family Time For Goal Achievement: 06/17/16 Potential to Achieve Goals: Good    Frequency 7X/week   Barriers to discharge        Co-evaluation               End of Session Equipment Utilized During Treatment: Gait belt Activity Tolerance: Patient tolerated treatment well Patient left: in chair;with call bell/phone within reach Nurse Communication: Mobility status         Time: 1451-1536 PT Time Calculation (min) (ACUTE ONLY): 45 min   Charges:   PT Evaluation $PT Eval Moderate Complexity: 1 Procedure PT Treatments $Gait Training: 8-22 mins $Therapeutic Activity: 8-22 mins   PT G Codes:        Colletta Maryland 06/10/2016, 5:03 PM  Roney Marion, [place] Pager [phone] Office [phone]

## 2016-06-10 NOTE — Anesthesia Procedure Notes (Signed)
Spinal Patient location during procedure: OR Staffing Anesthesiologist: Juliane Guest Preanesthetic Checklist Completed: patient identified, surgical consent, pre-op evaluation, timeout performed, IV checked, risks and benefits discussed and monitors and equipment checked Spinal Block Patient position: sitting Prep: site prepped and draped and DuraPrep Patient monitoring: heart rate, cardiac monitor, continuous pulse ox and blood pressure Approach: midline Location: L3-4 Injection technique: single-shot Needle Needle type: Pencan  Needle gauge: 24 G Needle length: 10 cm Assessment Sensory level: T6   

## 2016-06-10 NOTE — Anesthesia Preprocedure Evaluation (Addendum)
Anesthesia Evaluation  Patient identified by MRN, date of birth, ID band Patient awake    Reviewed: Allergy & Precautions, NPO status , Patient's Chart, lab work & pertinent test results  History of Anesthesia Complications (+) PONV and history of anesthetic complications  Airway Mallampati: III  TM Distance: <3 FB Neck ROM: Full    Dental  (+) Teeth Intact   Pulmonary neg shortness of breath, neg sleep apnea, neg COPD, neg recent URI, neg PE   breath sounds clear to auscultation       Cardiovascular negative cardio ROS   Rhythm:Regular     Neuro/Psych negative neurological ROS  negative psych ROS   GI/Hepatic Neg liver ROS, GERD  Medicated and Controlled,  Endo/Other  Hypothyroidism Morbid obesity  Renal/GU negative Renal ROS     Musculoskeletal  (+) Arthritis ,   Abdominal   Peds  Hematology negative hematology ROS (+)   Anesthesia Other Findings Previous acdf  Reproductive/Obstetrics                             Anesthesia Physical Anesthesia Plan  ASA: II  Anesthesia Plan: Spinal   Post-op Pain Management:    Induction:   Airway Management Planned: Natural Airway, Nasal Cannula and Simple Face Mask  Additional Equipment: None  Intra-op Plan:   Post-operative Plan:   Informed Consent: I have reviewed the patients History and Physical, chart, labs and discussed the procedure including the risks, benefits and alternatives for the proposed anesthesia with the patient or authorized representative who has indicated his/her understanding and acceptance.   Dental advisory given  Plan Discussed with: CRNA and Surgeon  Anesthesia Plan Comments:         Anesthesia Quick Evaluation

## 2016-06-10 NOTE — Interval H&P Note (Signed)
History and Physical Interval Note:  06/10/2016 7:28 AM  Emma Lewis  has presented today for surgery, with the diagnosis of FAILED LEFT HIP ARTHROPLASTY  The various methods of treatment have been discussed with the patient and family. After consideration of risks, benefits and other options for treatment, the patient has consented to  Procedure(s): TOTAL HIP REVISION ARTHROPLASTY (Left) as a surgical intervention .  The patient's history has been reviewed, patient examined, no change in status, stable for surgery.  I have reviewed the patient's chart and labs.  Questions were answered to the patient's satisfaction.     Kinte Trim, Horald Pollen

## 2016-06-10 NOTE — Discharge Instructions (Signed)
°Dr. Carle Fenech °Joint Replacement Specialist °[facility] °[address] °[place],  [address] °[phone] ° ° °TOTAL HIP REPLACEMENT POSTOPERATIVE DIRECTIONS ° ° ° °Hip Rehabilitation, Guidelines Following Surgery  ° °WEIGHT BEARING °Weight bearing as tolerated with assist device (walker, cane, etc) as directed, use it as long as suggested by your surgeon or therapist, typically at least 4-6 weeks. ° °The results of a hip operation are greatly improved after range of motion and muscle strengthening exercises. Follow all safety measures which are given to protect your hip. If any of these exercises cause increased pain or swelling in your joint, decrease the amount until you are comfortable again. Then slowly increase the exercises. Call your caregiver if you have problems or questions.  ° °HOME CARE INSTRUCTIONS  °Most of the following instructions are designed to prevent the dislocation of your new hip.  °Remove items at home which could result in a fall. This includes throw rugs or furniture in walking pathways.  °Continue medications as instructed at time of discharge. °· You may have some home medications which will be placed on hold until you complete the course of blood thinner medication. °· You may start showering once you are discharged home. Do not remove your dressing. °Do not put on socks or shoes without following the instructions of your caregivers.   °Sit on chairs with arms. Use the chair arms to help push yourself up when arising.  °Arrange for the use of a toilet seat elevator so you are not sitting low.  °· Walk with walker as instructed.  °You may resume a sexual relationship in one month or when given the OK by your caregiver.  °Use walker as long as suggested by your caregivers.  °You may put full weight on your legs and walk as much as is comfortable. °Avoid periods of inactivity such as sitting longer than an hour when not asleep. This helps prevent  blood clots.  °You may return to work once you are cleared by your surgeon.  °Do not drive a car for 6 weeks or until released by your surgeon.  °Do not drive while taking narcotics.  °Wear elastic stockings for two weeks following surgery during the day but you may remove then at night.  °Make sure you keep all of your appointments after your operation with all of your doctors and caregivers. You should call the office at the above phone number and make an appointment for approximately two weeks after the date of your surgery. °Please pick up a stool softener and laxative for home use as long as you are requiring pain medications. °· ICE to the affected hip every three hours for 30 minutes at a time and then as needed for pain and swelling. Continue to use ice on the hip for pain and swelling from surgery. You may notice swelling that will progress down to the foot and ankle.  This is normal after surgery.  Elevate the leg when you are not up walking on it.   °It is important for you to complete the blood thinner medication as prescribed by your doctor. °· Continue to use the breathing machine which will help keep your temperature down.  It is common for your temperature to cycle up and down following surgery, especially at night when you are not up moving around and exerting yourself.  The breathing machine keeps your lungs expanded and your temperature down. ° °RANGE OF MOTION AND STRENGTHENING EXERCISES  °These exercises are   designed to help you keep full movement of your hip joint. Follow your caregiver's or physical therapist's instructions. Perform all exercises about fifteen times, three times per day or as directed. Exercise both hips, even if you have had only one joint replacement. These exercises can be done on a training (exercise) mat, on the floor, on a table or on a bed. Use whatever works the best and is most comfortable for you. Use music or television while you are exercising so that the exercises  are a pleasant break in your day. This will make your life better with the exercises acting as a break in routine you can look forward to.  °Lying on your back, slowly slide your foot toward your buttocks, raising your knee up off the floor. Then slowly slide your foot back down until your leg is straight again.  °Lying on your back spread your legs as far apart as you can without causing discomfort.  °Lying on your side, raise your upper leg and foot straight up from the floor as far as is comfortable. Slowly lower the leg and repeat.  °Lying on your back, tighten up the muscle in the front of your thigh (quadriceps muscles). You can do this by keeping your leg straight and trying to raise your heel off the floor. This helps strengthen the largest muscle supporting your knee.  °Lying on your back, tighten up the muscles of your buttocks both with the legs straight and with the knee bent at a comfortable angle while keeping your heel on the floor.  ° °SKILLED REHAB INSTRUCTIONS: °If the patient is transferred to a skilled rehab facility following release from the hospital, a list of the current medications will be sent to the facility for the patient to continue.  When discharged from the skilled rehab facility, please have the facility set up the patient's Home Health Physical Therapy prior to being released. Also, the skilled facility will be responsible for providing the patient with their medications at time of release from the facility to include their pain medication and their blood thinner medication. If the patient is still at the rehab facility at time of the two week follow up appointment, the skilled rehab facility will also need to assist the patient in arranging follow up appointment in our office and any transportation needs. ° °MAKE SURE YOU:  °Understand these instructions.  °Will watch your condition.  °Will get help right away if you are not doing well or get worse. ° °Pick up stool softner and  laxative for home use following surgery while on pain medications. °Do not remove your dressing. °The dressing is waterproof--it is OK to take showers. °Continue to use ice for pain and swelling after surgery. °Do not use any lotions or creams on the incision until instructed by your surgeon. °Total Hip Protocol. °POSTERIOR HIP PRECAUTIONS. ° ° °

## 2016-06-10 NOTE — Brief Op Note (Signed)
06/10/2016  10:27 AM  PATIENT:  Emma Lewis  65 y.o. female  PRE-OPERATIVE DIAGNOSIS:  FAILED LEFT HIP ARTHROPLASTY  POST-OPERATIVE DIAGNOSIS:  FAILED LEFT HIP ARTHROPLASTY  PROCEDURE:  Procedure(s): TOTAL HIP REVISION ARTHROPLASTY (Left)  SURGEON:  Surgeon(s) and Role:    * Rod Can, MD - Primary  PHYSICIAN ASSISTANT: none  ASSISTANTS: justin queen, rnfa   ANESTHESIA:   spinal  EBL:  Total I/O In: 1250 [I.V.:1000; IV Piggyback:250] Out: 150 [Blood:150]  BLOOD ADMINISTERED:none  DRAINS: none   LOCAL MEDICATIONS USED:  MARCAINE     SPECIMEN:  No Specimen  DISPOSITION OF SPECIMEN:  N/A  COUNTS:  YES  TOURNIQUET:  * No tourniquets in log *  DICTATION: .Other Dictation: Dictation Number [id number]  PLAN OF CARE: Admit to inpatient   PATIENT DISPOSITION:  PACU - hemodynamically stable.   Delay start of Pharmacological VTE agent (>24hrs) due to surgical blood loss or risk of bleeding: no

## 2016-06-10 NOTE — H&P (View-Only) (Signed)
TOTAL HIP REVISION ADMISSION H&P  Patient is admitted for left revision total hip arthroplasty.  Subjective:  Chief Complaint: left hip pain  HPI: Emma Lewis, 65 y.o. female, has a history of pain and functional disability in the left hip due to arthritis and patient has failed non-surgical conservative treatments for greater than 12 weeks to include NSAID's and/or analgesics and activity modification. The indications for the revision total hip arthroplasty are fracture or mechanical failure of one or more component.  Onset of symptoms was abrupt starting 1 years ago with rapidlly worsening course since that time.  Prior procedures on the left hip include arthroplasty.  Patient currently rates pain in the left hip at 10 out of 10 with activity.  There is night pain, worsening of pain with activity and weight bearing, pain that interfers with activities of daily living and crepitus. This condition presents safety issues increasing the risk of falls.    There is no current active infection.  Patient Active Problem List   Diagnosis Date Noted  . Pelvic mass 06/15/2015  . Closed wedge compression fracture of fifth lumbar vertebra (Newborn)   . Cervical spondylosis 02/09/2014   Past Medical History:  Diagnosis Date  . Arthritis   . Bronchitis    hx of  . Fever blister    Takes Acyclovir  . GERD (gastroesophageal reflux disease)   . Hyperlipemia   . Hypothyroidism   . Osteopenia   . Pneumonia    hx of  . PONV (postoperative nausea and vomiting)   . Seasonal allergies     Past Surgical History:  Procedure Laterality Date  . ANTERIOR CERVICAL DECOMP/DISCECTOMY FUSION N/A 02/09/2014   Procedure: ANTERIOR CERVICAL DECOMPRESSION/DISCECTOMY FUSION 2 LEVELS;  Surgeon: [name], MD;  Location: [facility];  Service: Orthopedics;  Laterality: N/A;  C4-5, C5-6 Anterior Cervical Discectomy and Fusion, Allograft, Plate  . BACK SURGERY     lumbar fusion  . COLONOSCOPY    . GANGLION CYST EXCISION Left  1970s   wrist  . HIP ARTHROPLASTY Left 2011  . JOINT REPLACEMENT    . KNEE SURGERY Right   . ROBOTIC ASSISTED BILATERAL SALPINGO OOPHERECTOMY Bilateral 08/01/2015   Procedure: ROBOTIC ASSISTED BILATERAL SALPINGO OOPHORECTOMY;  Surgeon: Everitt Amber, MD;  Location: [facility];  Service: Gynecology;  Laterality: Bilateral;  . SHOULDER OPEN ROTATOR CUFF REPAIR Right   . SHOULDER SURGERY Right      (Not in a hospital admission) Allergies  Allergen Reactions  . Sulfa Antibiotics Other (See Comments)    Mother said "I liked to have died"  . Alendronate Diarrhea  . Simvastatin Other (See Comments)    Fatty liver disease  . Fenofibrate Rash    She didn't feel right    Social History  Substance Use Topics  . Smoking status: Never Smoker  . Smokeless tobacco: Never Used  . Alcohol use Yes     Comment: rare    Family History  Problem Relation Age of Onset  . Lymphoma Mother       Review of Systems  Constitutional: Positive for diaphoresis.  HENT: Positive for tinnitus.   Eyes: Negative.   Cardiovascular: Negative.   Gastrointestinal: Negative.   Musculoskeletal: Positive for back pain and joint pain.  Skin: Negative.   Neurological: Negative.   Endo/Heme/Allergies: Negative.   Psychiatric/Behavioral: Negative.     Objective:  Physical Exam  Vitals reviewed. Constitutional: She is oriented to person, place, and time. She appears well-developed and well-nourished.  HENT:  Head: Normocephalic and atraumatic.  Eyes: Conjunctivae and EOM are normal. Pupils are equal, round, and reactive to light.  Neck: Normal range of motion. Neck supple.  Cardiovascular: Normal rate, regular rhythm and intact distal pulses.   Respiratory: Effort normal. No respiratory distress.  GI: Soft. She exhibits no distension.  Genitourinary:  Genitourinary Comments: deferred  Musculoskeletal:       Left hip: She exhibits decreased range of motion, decreased strength and crepitus.        Legs: Neurological: She is alert and oriented to person, place, and time. She has normal reflexes.  Skin: Skin is warm and dry.  Psychiatric: She has a normal mood and affect. Her behavior is normal. Judgment and thought content normal.    Vital signs in last 24 hours: @VSRANGES @   Labs:   Estimated body mass index is 31.62 kg/m as calculated from the following:   Height as of 09/01/15: 5\' 5"  (1.651 m).   Weight as of 09/01/15: 86.2 kg (190 lb).  Imaging Review:  Plain radiographs demonstrate displacement of the liner. There is evidence of failure of the liner.The bone quality appears to be adequate for age and reported activity level.  Assessment/Plan:  End stage arthritis, left hip(s) with failed previous arthroplasty.  The patient history, physical examination, clinical judgement of the provider and imaging studies are consistent with end stage degenerative joint disease of the left hip(s), previous total hip arthroplasty. Revision total hip arthroplasty is deemed medically necessary. The treatment options including medical management, injection therapy, arthroscopy and arthroplasty were discussed at length. The risks and benefits of total hip arthroplasty were presented and reviewed. The risks due to aseptic loosening, infection, stiffness, dislocation/subluxation,  thromboembolic complications and other imponderables were discussed.  The patient acknowledged the explanation, agreed to proceed with the plan and consent was signed. Patient is being admitted for inpatient treatment for surgery, pain control, PT, OT, prophylactic antibiotics, VTE prophylaxis, progressive ambulation and ADL's and discharge planning. The patient is planning to be discharged home with home health services

## 2016-06-11 ENCOUNTER — Encounter (HOSPITAL_COMMUNITY): Payer: Self-pay | Admitting: Orthopedic Surgery

## 2016-06-11 LAB — CBC
HCT: 32.4 % — ABNORMAL LOW (ref 36.0–46.0)
Hemoglobin: 10.5 g/dL — ABNORMAL LOW (ref 12.0–15.0)
MCH: 31.2 pg (ref 26.0–34.0)
MCHC: 32.4 g/dL (ref 30.0–36.0)
MCV: 96.1 fL (ref 78.0–100.0)
PLATELETS: 235 10*3/uL (ref 150–400)
RBC: 3.37 MIL/uL — ABNORMAL LOW (ref 3.87–5.11)
RDW: 13.6 % (ref 11.5–15.5)
WBC: 8.1 10*3/uL (ref 4.0–10.5)

## 2016-06-11 LAB — BASIC METABOLIC PANEL
Anion gap: 4 — ABNORMAL LOW (ref 5–15)
BUN: 6 mg/dL (ref 6–20)
CALCIUM: 8.9 mg/dL (ref 8.9–10.3)
CO2: 28 mmol/L (ref 22–32)
CREATININE: 0.76 mg/dL (ref 0.44–1.00)
Chloride: 107 mmol/L (ref 101–111)
GFR calc Af Amer: >60 mL/min (ref >60)
GLUCOSE: 118 mg/dL — AB (ref 65–99)
POTASSIUM: 3.8 mmol/L (ref 3.5–5.1)
SODIUM: 139 mmol/L (ref 135–145)

## 2016-06-11 MED ORDER — HYDROCODONE-ACETAMINOPHEN 7.5-325 MG PO TABS: 1.0000 | ORAL_TABLET | ORAL | 0 refills | Status: DC | PRN

## 2016-06-11 MED ORDER — ONDANSETRON HCL 4 MG PO TABS: 4.0000 mg | ORAL_TABLET | Freq: Four times a day (QID) | ORAL | 0 refills | Status: DC | PRN

## 2016-06-11 MED ORDER — APIXABAN 2.5 MG PO TABS: 2.5000 mg | ORAL_TABLET | Freq: Two times a day (BID) | ORAL | 0 refills | Status: DC

## 2016-06-11 MED ORDER — SENNA 8.6 MG PO TABS: 2.0000 | ORAL_TABLET | Freq: Every day | ORAL | 0 refills | Status: DC

## 2016-06-11 MED ORDER — METHOCARBAMOL 500 MG PO TABS: 500.0000 mg | ORAL_TABLET | Freq: Four times a day (QID) | ORAL | 0 refills | Status: DC | PRN

## 2016-06-11 MED ORDER — DOCUSATE SODIUM 100 MG PO CAPS: 100.0000 mg | ORAL_CAPSULE | Freq: Two times a day (BID) | ORAL | 0 refills | Status: DC

## 2016-06-11 NOTE — Care Management Note (Signed)
Case Management Note  Patient Details  Name: Emma Lewis MRN: [id number] Date of Birth: 12/08/1950  Subjective/Objective:   65 yr old female s/p left hip revision.                  Action/Plan: Patient was preoperatively setup with [facility], no changes. Patient has all DME, will have family support at discharge.    Expected Discharge Date:    06/11/16              Expected Discharge Plan:  [place]  In-House Referral:     Discharge planning Services  CM Consult  Post Acute Care Choice:  Home Health Choice offered to:  Patient  DME Arranged:  N/A DME Agency:  NA  HH Arranged:  PT [place] Agency:  [facility] (now [facility])  Status of Service:  Completed, signed off  If discussed at [employer] of Stay Meetings, dates discussed:    Additional Comments:  Ninfa Meeker, RN 06/11/2016, 2:15 PM

## 2016-06-11 NOTE — Progress Notes (Signed)
Discharge instructions given. Pt verbalized understanding and all questions were answered.  

## 2016-06-11 NOTE — Anesthesia Postprocedure Evaluation (Signed)
Anesthesia Post Note  Patient: Emma Lewis  Procedure(s) Performed: Procedure(s) (LRB): TOTAL HIP REVISION ARTHROPLASTY (Left)  Patient location during evaluation: PACU Anesthesia Type: Spinal Level of consciousness: awake Pain management: pain level controlled Vital Signs Assessment: post-procedure vital signs reviewed and stable Respiratory status: spontaneous breathing Cardiovascular status: stable Postop Assessment: no signs of nausea or vomiting and spinal receding Anesthetic complications: no    Last Vitals:  Vitals:   06/11/16 0021 06/11/16 0517  BP: (!) 105/57 114/67  Pulse: 93 78  Resp: 18 18  Temp: 37.1 C 36.9 C    Last Pain:  Vitals:   06/11/16 0839  TempSrc:   PainSc: 0-No pain                 Parke Jandreau

## 2016-06-11 NOTE — Progress Notes (Signed)
Physical Therapy Treatment Patient Details Name: Emma Lewis MRN: [id number] DOB: 1950/08/13 Today's Date: 06/11/2016    History of Present Illness Admitted for LTHA revision;  has a past medical history of Arthritis;  GERD (gastroesophageal reflux disease);  Hypothyroidism; Osteopenia;  PONV (postoperative nausea and vomiting);  has a past surgical history that includes  Knee surgery (Right); Shoulder open rotator cuff repair (Right); Shoulder surgery (Right); Joint replacement; Hip Arthroplasty (Left, 2011); Colonoscopy; Anterior cervical decomp/discectomy fusion (N/A, 02/09/2014)    PT Comments    Patient is making good progress with PT.  From a mobility standpoint anticipate patient will be ready for DC home when medically ready.     Follow Up Recommendations  Home health PT;Supervision/Assistance - 24 hour     Equipment Recommendations  None recommended by PT    Recommendations for Other Services OT consult     Precautions / Restrictions Precautions Precautions: Posterior Hip Precaution Booklet Issued: Yes (comment) Precaution Comments: Eduacted in Post Prec during functional activity Restrictions Weight Bearing Restrictions: Yes LLE Weight Bearing: Weight bearing as tolerated    Mobility  Bed Mobility Overal bed mobility: Modified Independent Bed Mobility: Sit to Supine           General bed mobility comments: increased time and effort; cues for maintaining precautions  Transfers Overall transfer level: Needs assistance Equipment used: Rolling walker (2 wheeled) Transfers: Sit to/from Stand Sit to Stand: Supervision         General transfer comment: supervision for safety  Ambulation/Gait Ambulation/Gait assistance: Supervision Ambulation Distance (Feet): 200 Feet Assistive device: Rolling walker (2 wheeled) Gait Pattern/deviations: Step-through pattern     General Gait Details: cues for posture and bilat heel strike   Stairs Stairs: Yes Stairs  assistance: Min guard Stair Management: One rail Left;Sideways Number of Stairs: 5 General stair comments: cues for sequencing and technique; min guard for safety  Wheelchair Mobility    Modified Rankin (Stroke Patients Only)       Balance                                    Cognition Arousal/Alertness: Awake/alert Behavior During Therapy: WFL for tasks assessed/performed Overall Cognitive Status: Within Functional Limits for tasks assessed                      Exercises Total Joint Exercises Quad Sets: AROM;Both;10 reps;Seated Gluteal Sets: AROM;Both;10 reps;Seated Heel Slides: AROM;Left;10 reps;Seated Hip ABduction/ADduction: AROM;Left;10 reps;Standing Long Arc Quad: AROM;Left;10 reps;Seated Knee Flexion: AROM;Left;10 reps;Standing Marching in Standing: AROM;Left;10 reps;Standing    General Comments        Pertinent Vitals/Pain Pain Assessment: 0-10 Pain Score: 3  Faces Pain Scale: Hurts little more Pain Location: L hip Pain Descriptors / Indicators: Sore Pain Intervention(s): Limited activity within patient's tolerance;Monitored during session;Premedicated before session;Repositioned    Home Living                      Prior Function            PT Goals (current goals can now be found in the care plan section) Acute Rehab PT Goals Patient Stated Goal: go home PT Goal Formulation: With patient/family Time For Goal Achievement: 06/17/16 Potential to Achieve Goals: Good Progress towards PT goals: Progressing toward goals    Frequency    7X/week      PT Plan Current plan remains appropriate  Co-evaluation             End of Session Equipment Utilized During Treatment: Gait belt Activity Tolerance: Patient tolerated treatment well Patient left: with call bell/phone within reach;in bed;with family/visitor present     Time: [id number] PT Time Calculation (min) (ACUTE ONLY): 28 min  Charges:  $Gait  Training: 8-22 mins $Therapeutic Exercise: 8-22 mins                     G Codes:      Salina April, PTA Pager: [phone]   06/11/2016, 4:19 PM

## 2016-06-11 NOTE — Op Note (Signed)
Emma Lewis, Emma Lewis NO.:  [id number]  MEDICAL RECORD NO.:  [id number]  LOCATION:  5N21C                        FACILITY:  [facility]  PHYSICIAN:  Rod Can, MD     DATE OF BIRTH:  24-Nov-1950  DATE OF PROCEDURE:  06/10/2016 DATE OF DISCHARGE:                              OPERATIVE REPORT   SURGEON:  Rod Can, MD.  ASSISTANT:  Ky Barban, RNFA.  PREOPERATIVE DIAGNOSIS:  Failed left total hip arthroplasty.  POSTOPERATIVE DIAGNOSIS:  Failed left total hip arthroplasty.  PROCEDURE PERFORMED:  Revision, left total hip arthroplasty, acetabular component.  EXPLANTS: 1. [facility] dynasty cup 52 mm. 2. [employer] polyethylene liner. 3. [employer] ceramic head ball 36- 3.  IMPLANTS: 1. Biomet G7 OsseoTi acetabular shell multihole, size 56 mm. 2. G7 acetabular liner 36+ 5 mm, neutral. 3. 6.5 mm cancellous bone screws x2. 4. [employer] femoral head 36+ 3.5, metal.  ANESTHESIA:  Spinal.  ESTIMATED BLOOD LOSS:  150 mL.  SPECIMENS:  None.  COMPLICATIONS:  None.  TUBES AND DRAINS:  None.  DISPOSITION:  Stable to PACU.  INDICATIONS:  The patient is a 65 year old female who has a history of previous left total hip arthroplasty that was performed by Dr. Eliezer Lofts. She was doing well until a couple of months ago when she started having mechanical symptoms and pain in the left hip.  X-ray showed dislocated polyethylene liner.  I sent her for inflammatory markers that were mildly elevated, and therefore I sent her for an image-guided left hip aspiration.  Workup was negative for infection.  We discussed the risks, benefits, and alternatives to left hip revision.  She elected to proceed.  DESCRIPTION OF PROCEDURE IN DETAIL:  I identified the patient in the holding area using 2 identifiers.  The surgical site was marked by myself.  She was taken to the operating room.  Spinal anesthesia was induced.  She was transferred to the operating room  table.  She was flipped in the right lateral decubitus position.  All bony prominences were well padded.  Axillary roll was placed.  She was secured to the table with a hip positioner.  The left lower extremity was prepped and draped in normal sterile surgical fashion.  Time-out was called verifying the side and site of surgery.  She did receive 2 g of Ancef within 60 minutes of beginning the procedure.  I began by utilizing her previous incision, full-thickness skin flaps were created.  I split the IT band in line with fibers.  Charnley retractor was placed.  I took down her short external rotators and posterior capsule in a single layer.  Upon entering the joint, she had thick joint fluid material that was heavily stained with metallic debris.  I dislocated her hip.  I removed her ceramic head ball from the trunnion.  The trunnion was not damaged in any way.  The ceramic head ball was not worn, it did have some metallic staining at the superior aspect.  Her acetabular liner was not engaged in the locking mechanism.  I removed the liner.  I placed acetabular retractors.  She had very significant wear to the acetabular component  itself from essentially 9:30 o'clock to about 11:30.  The locking mechanism was worn away in this region.  The cup itself was well fixed.  I identified a single acetabular screw.  I removed the screw.  I then used the Zimmer explant system to remove the cup entirely.  There was very minimal bone loss.  I then reamed a 55-mm reamer.  I placed a 56-mm cup in approximately 45 degrees of adduction and 20 degrees of anteversion.  The cup achieved excellent press fit.  I chose to augment the fixation with two 6.5 mm cancellous screws that were directed into the posterior-superior quadrant.  I then placed the real +5 liner.  I turned my attention to the proximal femur.  Her stem was well fixed. She did have some osteolysis of the femoral neck on the posterior aspect of  the femur. Medially, laterally, and anteriorly, there was no osteolysis, no bone defects.  The stem was very well fixed.  I placed a trial head.  The hip was reduced.  Stability testing was performed.  I then exchanged the trial head for the real head.  She had excellent soft tissue tension.  She was stable in potty position.  She was stable in full extension and external rotation.  In neutral adduction and 90 degrees of flexion, I was able to internally rotate her about 80 degrees before there was any impingement.  The hip was extremely stable.  I copiously irrigated the wound with normal saline using pulse lavage. Marcaine solution was injected into the periarticular tissues.  I closed the posterior capsule and short rotators with #1 Vicryl.  The IT band was closed with #1 Vicryl and 0 V-Loc, 2-0 Monocryl for the deep dermal layer, 3-0 running Monocryl subcuticular stitch, followed by glue.  Once the glue was dried, Aquacel was applied.  The patient was then flipped supine, aroused from anesthesia, taken to PACU in stable condition. Sponge, needle, counts were correct at the end of the case x2.  There were no complications.  Postoperatively, we will admit the patient to the hospital.  She may weight bear as tolerated with a walker.  I would like to keep her on a walker for 6 weeks.  She will follow posterior hip precautions.  We will place her on apixaban for DVT prophylaxis.  She will work with physical therapy.  She will have disposition planning.  I will see her in 2 weeks after discharge.          ______________________________ Rod Can, MD     BS/MEDQ  D:  06/10/2016  T:  06/11/2016  Job:  [id number]

## 2016-06-11 NOTE — Progress Notes (Signed)
Physical Therapy Treatment Patient Details Name: Emma Lewis MRN: [id number] DOB: Apr 27, 1951 Today's Date: 06/11/2016    History of Present Illness Admitted for LTHA revision;  has a past medical history of Arthritis;  GERD (gastroesophageal reflux disease);  Hypothyroidism; Osteopenia;  PONV (postoperative nausea and vomiting);  has a past surgical history that includes  Knee surgery (Right); Shoulder open rotator cuff repair (Right); Shoulder surgery (Right); Joint replacement; Hip Arthroplasty (Left, 2011); Colonoscopy; Anterior cervical decomp/discectomy fusion (N/A, 02/09/2014)    PT Comments    Patient is progressing well toward mobility goals. Continue to progress as tolerated with anticipated d/c home with HHPT.   Follow Up Recommendations  Home health PT;Supervision/Assistance - 24 hour     Equipment Recommendations  None recommended by PT    Recommendations for Other Services OT consult     Precautions / Restrictions Precautions Precautions: Posterior Hip Precaution Booklet Issued: Yes (comment) Precaution Comments: Eduacted in Post Prec during functional activity Restrictions Weight Bearing Restrictions: Yes LLE Weight Bearing: Weight bearing as tolerated    Mobility  Bed Mobility Overal bed mobility: Modified Independent Bed Mobility: Sit to Supine     Supine to sit: Min guard     General bed mobility comments: increased time and effort; cues for maintaining precautions  Transfers Overall transfer level: Needs assistance Equipment used: Rolling walker (2 wheeled) Transfers: Sit to/from Stand Sit to Stand: Supervision Stand pivot transfers: Supervision       General transfer comment: supervision for safety  Ambulation/Gait Ambulation/Gait assistance: Supervision Ambulation Distance (Feet): 250 Feet Assistive device: Rolling walker (2 wheeled) Gait Pattern/deviations: Step-through pattern;Decreased stride length     General Gait Details: cues for  sequencing, posture, and bilat heel strike; pt with LE length discrepency and demonstrated slight trendelenburg-like gait   Stairs Stairs: Yes Stairs assistance: Min guard Stair Management: One rail Left;Sideways Number of Stairs: 5 General stair comments: cues for sequencing and technique; min guard for safety  Wheelchair Mobility    Modified Rankin (Stroke Patients Only)       Balance Overall balance assessment: Needs assistance   Sitting balance-Leahy Scale: Good       Standing balance-Leahy Scale: Fair Standing balance comment: Needs use of UEs on walker for mobility.                     Cognition Arousal/Alertness: Awake/alert Behavior During Therapy: WFL for tasks assessed/performed Overall Cognitive Status: Within Functional Limits for tasks assessed       Memory: Decreased short-term memory (pt able to state 2/3 THR precautions)              Exercises Total Joint Exercises Quad Sets: AROM;Both;10 reps;Seated Gluteal Sets: AROM;Both;10 reps;Seated Heel Slides: AROM;Left;10 reps;Seated Hip ABduction/ADduction: AROM;Left;10 reps;Seated Long Arc Quad: AROM;Left;10 reps;Seated    General Comments        Pertinent Vitals/Pain Pain Assessment: Faces Faces Pain Scale: Hurts little more Pain Location: L hip Pain Descriptors / Indicators: Aching;Sore Pain Intervention(s): Limited activity within patient's tolerance;Monitored during session;Premedicated before session;Repositioned    Home Living Family/patient expects to be discharged to:: Private residence Living Arrangements: Spouse/significant other Available Help at Discharge: Family;Available PRN/intermittently Type of Home: House Home Access: Stairs to enter Entrance Stairs-Rails: Right;Left Home Layout: One level Home Equipment: Walker - 2 wheels;Crutches;Bedside commode      Prior Function Level of Independence: Independent          PT Goals (current goals can now be found in  the care  plan section) Acute Rehab PT Goals Patient Stated Goal: go home PT Goal Formulation: With patient/family Time For Goal Achievement: 06/17/16 Potential to Achieve Goals: Good Progress towards PT goals: Progressing toward goals    Frequency    7X/week      PT Plan Current plan remains appropriate    Co-evaluation             End of Session Equipment Utilized During Treatment: Gait belt Activity Tolerance: Patient tolerated treatment well Patient left: with call bell/phone within reach;in bed;with family/visitor present     Time: 1122-1213 PT Time Calculation (min) (ACUTE ONLY): 51 min  Charges:  $Gait Training: 8-22 mins $Therapeutic Exercise: 8-22 mins $Therapeutic Activity: 8-22 mins                    G Codes:      Salina April, PTA Pager: [phone]   06/11/2016, 1:24 PM

## 2016-06-11 NOTE — Progress Notes (Addendum)
   Subjective:  Patient reports pain as moderate to severe.  Denies N/V/CP/SOB.  Objective:   VITALS:   Vitals:   06/10/16 1357 06/10/16 1932 06/11/16 0021 06/11/16 0517  BP: 114/60 (!) 126/56 (!) 105/57 114/67  Pulse: 88 94 93 78  Resp: 18 18 18 18   Temp: 97.8 F (36.6 C) 97.5 F (36.4 C) 98.8 F (37.1 C) 98.4 F (36.9 C)  TempSrc: Oral Oral Oral Oral  SpO2: 98% 94% 92% 100%  Weight:      Height:        NAD ABD soft Sensation intact distally Intact pulses distally Dorsiflexion/Plantar flexion intact Incision: dressing C/D/I Compartment soft    Lab Results  Component Value Date   WBC 8.1 06/11/2016   HGB 10.5 (L) 06/11/2016   HCT 32.4 (L) 06/11/2016   MCV 96.1 06/11/2016   PLT 235 06/11/2016   BMET    Component Value Date/Time   NA 139 06/11/2016 0403   K 3.8 06/11/2016 0403   CL 107 06/11/2016 0403   CO2 28 06/11/2016 0403   GLUCOSE 118 (H) 06/11/2016 0403   BUN 6 06/11/2016 0403   CREATININE 0.76 06/11/2016 0403   CALCIUM 8.9 06/11/2016 0403   GFRNONAA >60 06/11/2016 0403   GFRAA >60 06/11/2016 0403     Assessment/Plan: 1 Day Post-Op   Principal Problem:   Failed total hip arthroplasty ([place])   WBAT with walker Posterior hip precautions PO pain control PT/OT DVT ppx: apixaban, SCDs, TEDs Dispo: D/C home after clears therapy and pain controlled   Acelyn Basham, Horald Pollen 06/11/2016, 7:44 AM   Rod Can, MD Cell [phone]

## 2016-06-11 NOTE — Discharge Summary (Signed)
Physician Discharge Summary  Patient ID: Emma Lewis MRN: [id number] DOB/AGE: [birth year] 65 y.o.  Admit date: 06/10/2016 Discharge date: 06/11/2016  Admission Diagnoses:  Failed total hip arthroplasty [facility])  Discharge Diagnoses:  Principal Problem:   Failed total hip arthroplasty [facility])   Past Medical History:  Diagnosis Date  . Arthritis   . Bronchitis    hx of  . Fatty liver   . Fever blister    Takes Acyclovir  . GERD (gastroesophageal reflux disease)   . Hyperlipemia   . Hypothyroidism   . Osteopenia   . Pneumonia    hx of     YRS AGO  . PONV (postoperative nausea and vomiting)   . Seasonal allergies     Surgeries: Procedure(s): TOTAL HIP REVISION ARTHROPLASTY on 06/10/2016   Consultants (if any):   Discharged Condition: Improved  Hospital Course: [name] is an 65 y.o. female who was admitted 06/10/2016 with a diagnosis of Failed total hip arthroplasty ([place]) and went to the operating room on 06/10/2016 and underwent the above named procedures.    She was given perioperative antibiotics:  Anti-infectives    Start     Dose/Rate Route Frequency Ordered Stop   06/11/16 1000  acyclovir (ZOVIRAX) tablet 400 mg    Comments:  Take 1 tablet daily unless flare up then takes twice daily for 3 days     400 mg Oral Daily 06/10/16 1350     06/10/16 1500  vancomycin (VANCOCIN) IVPB 1000 mg/200 mL premix     1,000 mg 200 mL/hr over 60 Minutes Intravenous Every 12 hours 06/10/16 1350 06/10/16 1556   06/10/16 0607  ceFAZolin (ANCEF) IVPB 2g/100 mL premix     2 g 200 mL/hr over 30 Minutes Intravenous On call to O.R. 06/10/16 [id number] 06/10/16 0805    .  She was given sequential compression devices, early ambulation, and apixaban for DVT prophylaxis.  She benefited maximally from the hospital stay and there were no complications.    Recent vital signs:  Vitals:   06/11/16 0021 06/11/16 0517  BP: (!) 105/57 114/67  Pulse: 93 78  Resp: 18 18  Temp: 98.8 F (37.1 C)  98.4 F (36.9 C)    Recent laboratory studies:  Lab Results  Component Value Date   HGB 10.5 (L) 06/11/2016   HGB 13.9 05/30/2016   HGB 14.2 07/25/2015   Lab Results  Component Value Date   WBC 8.1 06/11/2016   PLT 235 06/11/2016   Lab Results  Component Value Date   INR 1.12 03/31/2015   Lab Results  Component Value Date   NA 139 06/11/2016   K 3.8 06/11/2016   CL 107 06/11/2016   CO2 28 06/11/2016   BUN 6 06/11/2016   CREATININE 0.76 06/11/2016   GLUCOSE 118 (H) 06/11/2016    Discharge Medications:     Medication List    STOP taking these medications   HYDROcodone-acetaminophen 5-325 MG tablet Commonly known as:  NORCO/VICODIN Replaced by:  HYDROcodone-acetaminophen 7.5-325 MG tablet     TAKE these medications   acyclovir 400 MG tablet Commonly known as:  ZOVIRAX Take 400 mg by mouth as directed. Take 1 tablet daily unless flare up then takes twice daily for 3 days   apixaban 2.5 MG Tabs tablet Commonly known as:  ELIQUIS Take 1 tablet (2.5 mg total) by mouth every 12 (twelve) hours.   ASPERCREME W/LIDOCAINE 4 % cream Generic drug:  lidocaine Apply 1 application topically as needed.  Biotin Plus Keratin 10000-100 MCG-MG Tabs Take 1 tablet by mouth daily.   CALCIUM 600 + D PO Take 1 tablet by mouth 3 (three) times daily.   clobetasol cream 0.05 % Commonly known as:  TEMOVATE Apply 1 application topically 2 (two) times daily. Apply twice a day for two weeks then twice a week What changed:  when to take this  additional instructions   docusate sodium 100 MG capsule Commonly known as:  COLACE Take 1 capsule (100 mg total) by mouth 2 (two) times daily.   EMERGEN-C VITAMIN C Pack Take 1 Package by mouth daily.   Evening Primrose Oil 500 MG Caps Take 1 capsule by mouth 3 (three) times daily.   Fish Oil 1200 MG Caps Take 1 capsule by mouth 3 (three) times daily.   HYDROcodone-acetaminophen 7.5-325 MG tablet Commonly known as:  NORCO Take  1-2 tablets by mouth every 4 (four) hours as needed (breakthrough pain). Replaces:  HYDROcodone-acetaminophen 5-325 MG tablet   ibuprofen 200 MG tablet Commonly known as:  ADVIL,MOTRIN Take 800 mg by mouth every 8 (eight) hours as needed (for pain).   levothyroxine 75 MCG tablet Commonly known as:  SYNTHROID, LEVOTHROID Take 75 mcg by mouth daily before breakfast.   Magnesium 125 MG Caps Take 1 capsule by mouth 3 (three) times daily.   methocarbamol 500 MG tablet Commonly known as:  ROBAXIN Take 1 tablet (500 mg total) by mouth every 6 (six) hours as needed for muscle spasms.   MOVE FREE JOINT HEALTH ADVANCE PO Take 1 tablet by mouth daily.   omeprazole 20 MG capsule Commonly known as:  PRILOSEC Take 10 mg by mouth daily.   ondansetron 4 MG tablet Commonly known as:  ZOFRAN Take 1 tablet (4 mg total) by mouth every 6 (six) hours as needed for nausea.   senna 8.6 MG Tabs tablet Commonly known as:  SENOKOT Take 2 tablets (17.2 mg total) by mouth at bedtime.   simvastatin 40 MG tablet Commonly known as:  ZOCOR Take 40 mg by mouth daily.   SOOTHE XP Soln Place 1 drop into both eyes daily as needed (dry eyes).            Durable Medical Equipment        Start     Ordered   06/10/16 1350  DME Bedside commode  Once     06/10/16 1350   06/10/16 1350  DME 3 n 1  Once     06/10/16 1350   06/10/16 1350  DME Walker rolling  Once     06/10/16 1350      Diagnostic Studies: Dg Pelvis Portable  Result Date: 06/10/2016 CLINICAL DATA:  Status post left total hip replacement. EXAM: PORTABLE PELVIS 1-2 VIEWS COMPARISON:  None. FINDINGS: AP portable pelvis at 1050 hours shows the patient be status post left total hip replacement. No evidence for immediate hardware complications. Patient is status post 2 level lower lumbar vertebral augmentation. Bones are diffusely demineralized. IMPRESSION: No evidence for immediate hardware complications status post left total hip  replacement. Electronically Signed   By: Misty Stanley M.D.   On: 06/10/2016 11:07    Disposition: 01-Home or Self Care  Discharge Instructions    Call MD / Call 911    Complete by:  As directed    If you experience chest pain or shortness of breath, CALL 911 and be transported to the hospital emergency room.  If you develope a fever above 101 F, pus (white  drainage) or increased drainage or redness at the wound, or calf pain, call your surgeon's office.   Constipation Prevention    Complete by:  As directed    Drink plenty of fluids.  Prune juice may be helpful.  You may use a stool softener, such as Colace (over the counter) 100 mg twice a day.  Use MiraLax (over the counter) for constipation as needed.   Diet - low sodium heart healthy    Complete by:  As directed    Discharge instructions    Complete by:  As directed    Posterior hip precautions WBAT with walker   Driving restrictions    Complete by:  As directed    No driving for 6 weeks   Follow the hip precautions as taught in Physical Therapy    Complete by:  As directed    Increase activity slowly as tolerated    Complete by:  As directed    Lifting restrictions    Complete by:  As directed    No lifting for 6 weeks   TED hose    Complete by:  As directed    Use stockings (TED hose) for 2 weeks on both leg(s).  You may remove them at night for sleeping.      Follow-up Information    Layce Sprung, Horald Pollen, MD. Schedule an appointment as soon as possible for a visit in 2 weeks.   Specialty:  Orthopedic Surgery Why:  For wound re-check Contact information: [address]. [address] [place] [address] [phone]            Signed: Elie Goody 06/11/2016, 7:48 AM

## 2016-06-11 NOTE — Evaluation (Signed)
Occupational Therapy Evaluation Patient Details Name: Emma Lewis MRN: [id number] DOB: 01-24-1951 Today's Date: 06/11/2016    History of Present Illness Admitted for LTHA revision;  has a past medical history of Arthritis;  GERD (gastroesophageal reflux disease);  Hypothyroidism; Osteopenia;  PONV (postoperative nausea and vomiting);  has a past surgical history that includes  Knee surgery (Right); Shoulder open rotator cuff repair (Right); Shoulder surgery (Right); Joint replacement; Hip Arthroplasty (Left, 2011); Colonoscopy; Anterior cervical decomp/discectomy fusion (N/A, 02/09/2014)   Clinical Impression   Pt completed selfcare tasks with use of the RW and AE with supervision level.  Able to state 2/3 hip precautions initially with min questioning cueing.  Has all needed DME at home and has been educated on where AE can be purchased and why it is something she will benefit from.  No further OT needs.  Feel pt will need initial 24 hour supervision for the next day or so then likely intermittent.     Follow Up Recommendations  No OT follow up;Supervision - Intermittent    Equipment Recommendations  None recommended by OT    Recommendations for Other Services       Precautions / Restrictions Precautions Precautions: Posterior Hip Precaution Booklet Issued: Yes (comment) Precaution Comments: Eduacted in [place] during functional activity Restrictions Weight Bearing Restrictions: No LLE Weight Bearing: Weight bearing as tolerated      Mobility Bed Mobility   Bed Mobility: Supine to Sit     Supine to sit: Min guard     General bed mobility comments: Cues for technqiue and precautions  Transfers Overall transfer level: Needs assistance Equipment used: Rolling walker (2 wheeled) Transfers: Sit to/from [facility] Sit to Stand: Supervision Stand pivot transfers: Supervision       General transfer comment: Pt needed min instructional cueing for left  turns as she demonstrated some internal rotation of the hip that she was unaware of.      Balance Overall balance assessment: Needs assistance   Sitting balance-Leahy Scale: Good       Standing balance-Leahy Scale: Fair Standing balance comment: Needs use of UEs on walker for mobility.                             ADL Overall ADL's : Needs assistance/impaired Eating/Feeding: Independent;Sitting   Grooming: Wash/dry hands;Oral care;Supervision/safety;Standing   Upper Body Bathing: Set up;Sitting   Lower Body Bathing: Supervison/ safety;Sit to/from stand;Adhering to hip precautions;With adaptive equipment   Upper Body Dressing : Supervision/safety;Sitting   Lower Body Dressing: Supervision/safety;Adhering to hip precautions;Sit to/from stand;With adaptive equipment   Toilet Transfer: Supervision/safety;BSC;RW;Ambulation   Toileting- Water quality scientist and Hygiene: Supervision/safety;Adhering to hip precautions;Sit to/from stand   Tub/ Shower Transfer: Conservation officer, nature;Ambulation;Anterior/posterior;Shower seat   Functional mobility during ADLs: Supervision/safety;Rolling walker General ADL Comments: Pt with slow gait speed and reporting hip length discrepancy with the LLE now being longer than the right.  Overall only supervision level for selfcare tasks and transfers at this time.  Has all needed DME.  Educated on availablity of AE for LB selfcare with pt able to return demonstrate use.       Vision Vision Assessment?: No apparent visual deficits   Perception Perception Perception Tested?: No   Praxis Praxis Praxis tested?: Not tested    Pertinent Vitals/Pain Pain Assessment: Faces Faces Pain Scale: Hurts a little bit Pain Location: left hip Pain Descriptors / Indicators: Aching;Discomfort Pain Intervention(s): Repositioned;Monitored during session;Limited activity  within patient's tolerance     Hand Dominance Right   Extremity/Trunk Assessment Upper  Extremity Assessment Upper Extremity Assessment: Overall WFL for tasks assessed   Lower Extremity Assessment Lower Extremity Assessment: Defer to PT evaluation   Cervical / Trunk Assessment Cervical / Trunk Assessment: Normal   Communication Communication Communication: No difficulties   Cognition Arousal/Alertness: Awake/alert Behavior During Therapy: WFL for tasks assessed/performed Overall Cognitive Status: Within Functional Limits for tasks assessed       Memory: Decreased short-term memory (pt able to state 2/3 THR precautions)                        Home Living Family/patient expects to be discharged to:: Private residence Living Arrangements: Spouse/significant other Available Help at Discharge: Family;Available PRN/intermittently Type of Home: House Home Access: Stairs to enter [employer] of Steps: 4 Entrance Stairs-Rails: Right;Left Home Layout: One level     Bathroom Shower/Tub: Occupational psychologist: Standard Bathroom Accessibility: Yes   Home Equipment: Environmental consultant - 2 wheels;Crutches;Bedside commode          Prior Functioning/Environment Level of Independence: Independent                                        End of Session Equipment Utilized During Treatment: Rolling walker Nurse Communication: Mobility status  Activity Tolerance: Patient tolerated treatment well Patient left: in chair;with call bell/phone within reach   Time: [id number] OT Time Calculation (min): 34 min Charges:  OT General Charges $OT Visit: 1 Procedure OT Evaluation $OT Eval Moderate Complexity: 1 Procedure OT Treatments $Self Care/Home Management : 23-37 mins  Elonda Giuliano OTR/L 06/11/2016, 9:50 AM

## 2016-06-12 DIAGNOSIS — Z96642 Presence of left artificial hip joint: Secondary | ICD-10-CM | POA: Diagnosis not present

## 2016-06-12 DIAGNOSIS — Z981 Arthrodesis status: Secondary | ICD-10-CM | POA: Diagnosis not present

## 2016-06-12 DIAGNOSIS — M1990 Unspecified osteoarthritis, unspecified site: Secondary | ICD-10-CM | POA: Diagnosis not present

## 2016-06-12 DIAGNOSIS — M47812 Spondylosis without myelopathy or radiculopathy, cervical region: Secondary | ICD-10-CM | POA: Diagnosis not present

## 2016-06-12 DIAGNOSIS — M8580 Other specified disorders of bone density and structure, unspecified site: Secondary | ICD-10-CM | POA: Diagnosis not present

## 2016-06-12 DIAGNOSIS — Z4732 Aftercare following explantation of hip joint prosthesis: Secondary | ICD-10-CM | POA: Diagnosis not present

## 2016-06-14 DIAGNOSIS — Z4732 Aftercare following explantation of hip joint prosthesis: Secondary | ICD-10-CM | POA: Diagnosis not present

## 2016-06-14 DIAGNOSIS — Z981 Arthrodesis status: Secondary | ICD-10-CM | POA: Diagnosis not present

## 2016-06-14 DIAGNOSIS — M47812 Spondylosis without myelopathy or radiculopathy, cervical region: Secondary | ICD-10-CM | POA: Diagnosis not present

## 2016-06-14 DIAGNOSIS — Z96642 Presence of left artificial hip joint: Secondary | ICD-10-CM | POA: Diagnosis not present

## 2016-06-14 DIAGNOSIS — M8580 Other specified disorders of bone density and structure, unspecified site: Secondary | ICD-10-CM | POA: Diagnosis not present

## 2016-06-14 DIAGNOSIS — M1990 Unspecified osteoarthritis, unspecified site: Secondary | ICD-10-CM | POA: Diagnosis not present

## 2016-06-17 DIAGNOSIS — M8580 Other specified disorders of bone density and structure, unspecified site: Secondary | ICD-10-CM | POA: Diagnosis not present

## 2016-06-17 DIAGNOSIS — Z96642 Presence of left artificial hip joint: Secondary | ICD-10-CM | POA: Diagnosis not present

## 2016-06-17 DIAGNOSIS — M47812 Spondylosis without myelopathy or radiculopathy, cervical region: Secondary | ICD-10-CM | POA: Diagnosis not present

## 2016-06-17 DIAGNOSIS — Z981 Arthrodesis status: Secondary | ICD-10-CM | POA: Diagnosis not present

## 2016-06-17 DIAGNOSIS — M1990 Unspecified osteoarthritis, unspecified site: Secondary | ICD-10-CM | POA: Diagnosis not present

## 2016-06-17 DIAGNOSIS — Z4732 Aftercare following explantation of hip joint prosthesis: Secondary | ICD-10-CM | POA: Diagnosis not present

## 2016-06-19 DIAGNOSIS — M1990 Unspecified osteoarthritis, unspecified site: Secondary | ICD-10-CM | POA: Diagnosis not present

## 2016-06-19 DIAGNOSIS — M47812 Spondylosis without myelopathy or radiculopathy, cervical region: Secondary | ICD-10-CM | POA: Diagnosis not present

## 2016-06-19 DIAGNOSIS — Z96642 Presence of left artificial hip joint: Secondary | ICD-10-CM | POA: Diagnosis not present

## 2016-06-19 DIAGNOSIS — M8580 Other specified disorders of bone density and structure, unspecified site: Secondary | ICD-10-CM | POA: Diagnosis not present

## 2016-06-19 DIAGNOSIS — Z4732 Aftercare following explantation of hip joint prosthesis: Secondary | ICD-10-CM | POA: Diagnosis not present

## 2016-06-19 DIAGNOSIS — Z981 Arthrodesis status: Secondary | ICD-10-CM | POA: Diagnosis not present

## 2016-06-25 DIAGNOSIS — Z96642 Presence of left artificial hip joint: Secondary | ICD-10-CM | POA: Diagnosis not present

## 2016-06-25 DIAGNOSIS — Z471 Aftercare following joint replacement surgery: Secondary | ICD-10-CM | POA: Diagnosis not present

## 2016-06-27 DIAGNOSIS — M1990 Unspecified osteoarthritis, unspecified site: Secondary | ICD-10-CM | POA: Diagnosis not present

## 2016-06-27 DIAGNOSIS — Z981 Arthrodesis status: Secondary | ICD-10-CM | POA: Diagnosis not present

## 2016-06-27 DIAGNOSIS — M47812 Spondylosis without myelopathy or radiculopathy, cervical region: Secondary | ICD-10-CM | POA: Diagnosis not present

## 2016-06-27 DIAGNOSIS — Z4732 Aftercare following explantation of hip joint prosthesis: Secondary | ICD-10-CM | POA: Diagnosis not present

## 2016-06-27 DIAGNOSIS — M8580 Other specified disorders of bone density and structure, unspecified site: Secondary | ICD-10-CM | POA: Diagnosis not present

## 2016-06-27 DIAGNOSIS — Z96642 Presence of left artificial hip joint: Secondary | ICD-10-CM | POA: Diagnosis not present

## 2016-07-24 DIAGNOSIS — Z471 Aftercare following joint replacement surgery: Secondary | ICD-10-CM | POA: Diagnosis not present

## 2016-07-24 DIAGNOSIS — Z96642 Presence of left artificial hip joint: Secondary | ICD-10-CM | POA: Diagnosis not present

## 2016-07-25 DIAGNOSIS — Z6833 Body mass index (BMI) 33.0-33.9, adult: Secondary | ICD-10-CM | POA: Diagnosis not present

## 2016-07-25 DIAGNOSIS — J209 Acute bronchitis, unspecified: Secondary | ICD-10-CM | POA: Diagnosis not present

## 2016-07-29 HISTORY — PX: CARPAL TUNNEL RELEASE: SHX101

## 2016-09-27 ENCOUNTER — Ambulatory Visit (INDEPENDENT_AMBULATORY_CARE_PROVIDER_SITE_OTHER): Payer: Medicare Other | Admitting: Gynecology

## 2016-09-27 ENCOUNTER — Encounter: Payer: Self-pay | Admitting: Gynecology

## 2016-09-27 VITALS — BP 126/80 | Ht 65.0 in | Wt 200.0 lb

## 2016-09-27 DIAGNOSIS — M8088XD Other osteoporosis with current pathological fracture, vertebra(e), subsequent encounter for fracture with routine healing: Secondary | ICD-10-CM | POA: Diagnosis not present

## 2016-09-27 DIAGNOSIS — M8008XD Age-related osteoporosis with current pathological fracture, vertebra(e), subsequent encounter for fracture with routine healing: Secondary | ICD-10-CM

## 2016-09-27 DIAGNOSIS — N3281 Overactive bladder: Secondary | ICD-10-CM | POA: Diagnosis not present

## 2016-09-27 DIAGNOSIS — N898 Other specified noninflammatory disorders of vagina: Secondary | ICD-10-CM

## 2016-09-27 DIAGNOSIS — M810 Age-related osteoporosis without current pathological fracture: Secondary | ICD-10-CM | POA: Diagnosis not present

## 2016-09-27 DIAGNOSIS — T192XXA Foreign body in vulva and vagina, initial encounter: Secondary | ICD-10-CM

## 2016-09-27 DIAGNOSIS — N3941 Urge incontinence: Secondary | ICD-10-CM | POA: Diagnosis not present

## 2016-09-27 DIAGNOSIS — Z01411 Encounter for gynecological examination (general) (routine) with abnormal findings: Secondary | ICD-10-CM

## 2016-09-27 LAB — WET PREP FOR TRICH, YEAST, CLUE
Trich, Wet Prep: NONE SEEN
Yeast Wet Prep HPF POC: NONE SEEN

## 2016-09-27 MED ORDER — CLINDAMYCIN PHOSPHATE 2 % VA CREA: 1.0000 | TOPICAL_CREAM | Freq: Every day | VAGINAL | 0 refills | Status: DC

## 2016-09-27 MED ORDER — OXYBUTYNIN CHLORIDE ER 10 MG PO TB24: 10.0000 mg | ORAL_TABLET | Freq: Every day | ORAL | 11 refills | Status: DC

## 2016-09-27 NOTE — Progress Notes (Addendum)
ALPA SCHEHR September 11, 1950 [id number]   History:    66 y.o.  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 Her surgery was done in January 2017. Patient is also been complaining of vaginal odor recently and also she feels that she has urgency incontinence. Patient also with recent hip surgery and several years ago she had a fractured  Vertebra which appears to be nontraumatic. At one point the past she had been on Fosamax and is not on any medication at the present time. Her last bone density study was in 2016 which demonstrated her AP spine and right hip score was -1.4 with a normal Frax analysis at that time. But based on the [place] she should be categorized as osteoporotic because of the vertebral fracture. She will be schedule a bone density study sometime in April. Dr. Karlton Lemon is her PCP who is been doing her blood work and her vaccines are up-to-date. Patient reports normal colonoscopy in 2015. Patient with no prior history of abnormal Pap smears.  Past medical history,surgical history, family history and social history were all reviewed and documented in the EPIC chart.  Gynecologic History No LMP recorded. Patient is postmenopausal. Contraception: post menopausal status Last Pap: 2016. Results were: normal Last mammogram: 2016. Results were: normal  Obstetric History OB History  Gravida Para Term Preterm AB Living  1       0 1  SAB TAB Ectopic Multiple Live Births        0      # Outcome Date GA Lbr Len/2nd Weight Sex Delivery Anes PTL Lv  1 Gravida                ROS: A ROS was performed and pertinent positives and negatives are included in the history.  GENERAL: No fevers or chills. HEENT: No change in vision, no earache, sore throat or sinus congestion. NECK: No pain or stiffness. CARDIOVASCULAR: No chest pain  or pressure. No palpitations. PULMONARY: No shortness of breath, cough or wheeze. GASTROINTESTINAL: No abdominal pain, nausea, vomiting or diarrhea, melena or bright red blood per rectum. GENITOURINARY: No urinary frequency, urgency, hesitancy or dysuria. MUSCULOSKELETAL: No joint or muscle pain, no back pain, no recent trauma. DERMATOLOGIC: No rash, no itching, no lesions. ENDOCRINE: No polyuria, polydipsia, no heat or cold intolerance. No recent change in weight. HEMATOLOGICAL: No anemia or easy bruising or bleeding. NEUROLOGIC: No headache, seizures, numbness, tingling or weakness. PSYCHIATRIC: No depression, no loss of interest in normal activity or change in sleep pattern.     Exam: chaperone present  BP 126/80   Ht 5\' 5"  (1.651 m)   Wt 200 lb (90.7 kg)   BMI 33.28 kg/m   Body mass index is 33.28 kg/m.  General appearance : Well developed well nourished female. No acute distress HEENT: Eyes: no retinal hemorrhage or exudates,  Neck supple, trachea midline, no carotid bruits, no thyroidmegaly Lungs: Clear to auscultation, no rhonchi or wheezes, or rib retractions  Heart: Regular rate and rhythm, no murmurs or gallops Breast:Examined in sitting and supine position were symmetrical in appearance, no palpable masses or tenderness,  no skin retraction, no nipple inversion, no nipple discharge, no skin discoloration, no axillary or supraclavicular lymphadenopathy Abdomen: no palpable masses or tenderness, no rebound or guarding Extremities: no edema or skin discoloration or tenderness  Pelvic:  Bartholin, Urethra, Skene Glands: Within normal limits             Vagina: Foreign body noted in the vagina when it was retrieved it was a Koh cup and discarded., Fish odor and friable vaginal mucosa noted  Cervix: No gross lesions or discharge  Uterus  anteverted, normal size, shape and consistency, non-tender and mobile  Adnexa  Without masses or tenderness  Anus and perineum  normal    Rectovaginal  normal sphincter tone without palpated masses or tenderness             Hemoccult PCP provides   wet prep few clue cells too numerous to count bacteria few white blood cell     Assessment/Plan:  66 y.o. female for annual exam with clinical evidence of bacterial vaginosis. Patient will be prescribed Cleocin vaginal cream to apply daily at bedtime for one week. Patient's symptoms of urgency incontinence may have been attributed to the foreign body in her vagina we will monitor symptoms for month if she continues to have the symptoms have provided her with literature information on Keagle exercises and I will then prescribed oxybutynin 10 mg by mouth daily she continues with the symptoms. I recommend she schedule her mammogram through her PCP in the community where she lives. I've also given her note to give to her orthopedic surgeon for consideration of starting her on Prolia because of her past history vertebral fracture. She is scheduled for bone density study in the next few months. Pap smear not indicated. We discussed importance of calcium vitamin D and weightbearing exercises for osteoporosis prevention. Because of patient's past history vitamin D deficiency a vitamin D level will be drawn today.  25 additional minutes was spent discussing the [facility], osteoporosis, treatments, foreign object found in her vagina, and urgency and stress urinary incontinence.   Terrance Mass MD, 1:38 PM 09/27/2016

## 2016-09-27 NOTE — Patient Instructions (Addendum)
Osteoporosis Osteoporosis is the thinning and loss of density in the bones. Osteoporosis makes the bones more brittle, fragile, and likely to break (fracture). Over time, osteoporosis can cause the bones to become so weak that they fracture after a simple fall. The bones most likely to fracture are the bones in the hip, wrist, and spine. What are the causes? The exact cause is not known. What increases the risk? Anyone can develop osteoporosis. You may be at greater risk if you have a family history of the condition or have poor nutrition. You may also have a higher risk if you are:  Female.  50 years old or older.  A smoker.  Not physically active.  White or Asian.  Slender.  What are the signs or symptoms? A fracture might be the first sign of the disease, especially if it results from a fall or injury that would not usually cause a bone to break. Other signs and symptoms include:  Low back and neck pain.  Stooped posture.  Height loss.  How is this diagnosed? To make a diagnosis, your health care provider may:  Take a medical history.  Perform a physical exam.  Order tests, such as: ? A bone mineral density test. ? A dual-energy X-ray absorptiometry test.  How is this treated? The goal of osteoporosis treatment is to strengthen your bones to reduce your risk of a fracture. Treatment may involve:  Making lifestyle changes, such as: ? Eating a diet rich in calcium. ? Doing weight-bearing and muscle-strengthening exercises. ? Stopping tobacco use. ? Limiting alcohol intake.  Taking medicine to slow the process of bone loss or to increase bone density.  Monitoring your levels of calcium and vitamin D.  Follow these instructions at home:  Include calcium and vitamin D in your diet. Calcium is important for bone health, and vitamin D helps the body absorb calcium.  Perform weight-bearing and muscle-strengthening exercises as directed by your health care  provider.  Do not use any tobacco products, including cigarettes, chewing tobacco, and electronic cigarettes. If you need help quitting, ask your health care provider.  Limit your alcohol intake.  Take medicines only as directed by your health care provider.  Keep all follow-up visits as directed by your health care provider. This is important.  Take precautions at home to lower your risk of falling, such as: ? Keeping rooms well lit and clutter free. ? Installing safety rails on stairs. ? Using rubber mats in the bathroom and other areas that are often wet or slippery. Get help right away if: You fall or injure yourself. This information is not intended to replace advice given to you by your health care provider. Make sure you discuss any questions you have with your health care provider. Document Released: 04/24/2005 Document Revised: 12/18/2015 Document Reviewed: 12/23/2013 Elsevier Interactive Patient Education  2017 [employer] Inc.  Denosumab injection What is this medicine? DENOSUMAB (den oh sue mab) slows bone breakdown. Prolia is used to treat osteoporosis in women after menopause and in men. Xgeva is used to treat a high calcium level due to cancer and to prevent bone fractures and other bone problems caused by multiple myeloma or cancer bone metastases. Xgeva is also used to treat giant cell tumor of the bone. This medicine may be used for other purposes; ask your health care provider or pharmacist if you have questions. COMMON BRAND NAME(S): Prolia, XGEVA What should I tell my health care provider before I take this medicine? They need to   of these conditions: -dental disease -having surgery or tooth extraction -infection -kidney disease -low levels of calcium or Vitamin D in the blood -malnutrition -on hemodialysis -skin conditions or sensitivity -thyroid or parathyroid disease -an unusual reaction to denosumab, other medicines, foods, dyes, or  preservatives -pregnant or trying to get pregnant -breast-feeding How should I use this medicine? This medicine is for injection under the skin. It is given by a health care professional in a hospital or clinic setting. If you are getting Prolia, a special MedGuide will be given to you by the pharmacist with each prescription and refill. Be sure to read this information carefully each time. For Prolia, talk to your pediatrician regarding the use of this medicine in children. Special care may be needed. For Delton See, talk to your pediatrician regarding the use of this medicine in children. While this drug may be prescribed for children as young as 13 years for selected conditions, precautions do apply. Overdosage: If you think you have taken too much of this medicine contact a poison control center or emergency room at once. NOTE: This medicine is only for you. Do not share this medicine with others. What if I miss a dose? It is important not to miss your dose. Call your doctor or health care professional if you are unable to keep an appointment. What may interact with this medicine? Do not take this medicine with any of the following medications: -other medicines containing denosumab This medicine may also interact with the following medications: -medicines that lower your chance of fighting infection -steroid medicines like prednisone or cortisone This list may not describe all possible interactions. Give your health care provider a list of all the medicines, herbs, non-prescription drugs, or dietary supplements you use. Also tell them if you smoke, drink alcohol, or use illegal drugs. Some items may interact with your medicine. What should I watch for while using this medicine? Visit your doctor or health care professional for regular checks on your progress. Your doctor or health care professional may order blood tests and other tests to see how you are doing. Call your doctor or health care  professional for advice if you get a fever, chills or sore throat, or other symptoms of a cold or flu. Do not treat yourself. This drug may decrease your body's ability to fight infection. Try to avoid being around people who are sick. You should make sure you get enough calcium and vitamin D while you are taking this medicine, unless your doctor tells you not to. Discuss the foods you eat and the vitamins you take with your health care professional. See your dentist regularly. Brush and floss your teeth as directed. Before you have any dental work done, tell your dentist you are receiving this medicine. Do not become pregnant while taking this medicine or for 5 months after stopping it. Talk with your doctor or health care professional about your birth control options while taking this medicine. Women should inform their doctor if they wish to become pregnant or think they might be pregnant. There is a potential for serious side effects to an unborn child. Talk to your health care professional or pharmacist for more information. What side effects may I notice from receiving this medicine? Side effects that you should report to your doctor or health care professional as soon as possible: -allergic reactions like skin rash, itching or hives, swelling of the face, lips, or tongue -bone pain -breathing problems -dizziness -jaw pain, especially after dental work -redness,  blistering, peeling of the skin -signs and symptoms of infection like fever or chills; cough; sore throat; pain or trouble passing urine -signs of low calcium like fast heartbeat, muscle cramps or muscle pain; pain, tingling, numbness in the hands or feet; seizures -unusual bleeding or bruising -unusually weak or tired Side effects that usually do not require medical attention (report to your doctor or health care professional if they continue or are bothersome): -constipation -diarrhea -headache -joint pain -loss of  appetite -muscle pain -runny nose -tiredness -upset stomach This list may not describe all possible side effects. Call your doctor for medical advice about side effects. You may report side effects to FDA at 1-800-FDA-1088. Where should I keep my medicine? This medicine is only given in a clinic, doctor's office, or other health care setting and will not be stored at home. NOTE: This sheet is a summary. It may not cover all possible information. If you have questions about this medicine, talk to your doctor, pharmacist, or health care provider.  2018 Elsevier/Gold Standard (2016-08-06 19:17:21)  Kegel Exercises Kegel exercises help strengthen the muscles that support the rectum, vagina, small intestine, bladder, and uterus. Doing Kegel exercises can help:  Improve bladder and bowel control.  Improve sexual response.  Reduce problems and discomfort during pregnancy. Kegel exercises involve squeezing your pelvic floor muscles, which are the same muscles you squeeze when you try to stop the flow of urine. The exercises can be done while sitting, standing, or lying down, but it is best to vary your position. Phase 1 exercises 1. Squeeze your pelvic floor muscles tight. You should feel a tight lift in your rectal area. If you are a female, you should also feel a tightness in your vaginal area. Keep your stomach, buttocks, and legs relaxed. 2. Hold the muscles tight for up to 10 seconds. 3. Relax your muscles. Repeat this exercise 50 times a day or as many times as told by your health care provider. Continue to do this exercise for at least 4-6 weeks or for as long as told by your health care provider. This information is not intended to replace advice given to you by your health care provider. Make sure you discuss any questions you have with your health care provider. Document Released: 07/01/2012 Document Revised: 03/09/2016 Document Reviewed: 06/04/2015 Elsevier Interactive Patient  Education  2017 Elsevier Inc. Oxybutynin extended-release tablets What is this medicine? OXYBUTYNIN (ox i BYOO ti nin) is used to treat overactive bladder. This medicine reduces the amount of bathroom visits. It may also help to control wetting accidents. This medicine may be used for other purposes; ask your health care provider or pharmacist if you have questions. COMMON BRAND NAME(S): Ditropan XL What should I tell my health care provider before I take this medicine? They need to know if you have any of these conditions: -autonomic neuropathy -dementia -difficulty passing urine -glaucoma -intestinal obstruction -kidney disease -liver disease -myasthenia gravis -Parkinson's disease -an unusual or allergic reaction to oxybutynin, other medicines, foods, dyes, or preservatives -pregnant or trying to get pregnant -breast-feeding How should I use this medicine? Take this medicine by mouth with a glass of water. Swallow whole, do not crush, cut, or chew. Follow the directions on the prescription label. You can take this medicine with or without food. Take your doses at regular intervals. Do not take your medicine more often than directed. Talk to your pediatrician regarding the use of this medicine in children. Special care may be needed. While this  drug may be prescribed for children as young as 6 years for selected conditions, precautions do apply. Overdosage: If you think you have taken too much of this medicine contact a poison control center or emergency room at once. NOTE: This medicine is only for you. Do not share this medicine with others. What if I miss a dose? If you miss a dose, take it as soon as you can. If it is almost time for your next dose, take only that dose. Do not take double or extra doses. What may interact with this medicine? -antihistamines for allergy, cough and cold -atropine -certain medicines for bladder problems like oxybutynin, tolterodine -certain  medicines for Parkinson's disease like benztropine, trihexyphenidyl -certain medicines for stomach problems like dicyclomine, hyoscyamine -certain medicines for travel sickness like scopolamine -clarithromycin -erythromycin -ipratropium -medicines for fungal infections, like fluconazole, itraconazole, ketoconazole or voriconazole This list may not describe all possible interactions. Give your health care provider a list of all the medicines, herbs, non-prescription drugs, or dietary supplements you use. Also tell them if you smoke, drink alcohol, or use illegal drugs. Some items may interact with your medicine. What should I watch for while using this medicine? It may take a few weeks to notice the full benefit from this medicine. You may need to limit your intake tea, coffee, caffeinated sodas, and alcohol. These drinks may make your symptoms worse. You may get drowsy or dizzy. Do not drive, use machinery, or do anything that needs mental alertness until you know how this medicine affects you. Do not stand or sit up quickly, especially if you are an older patient. This reduces the risk of dizzy or fainting spells. Alcohol may interfere with the effect of this medicine. Avoid alcoholic drinks. Your mouth may get dry. Chewing sugarless gum or sucking hard candy, and drinking plenty of water may help. Contact your doctor if the problem does not go away or is severe. This medicine may cause dry eyes and blurred vision. If you wear contact lenses, you may feel some discomfort. Lubricating drops may help. See your eyecare professional if the problem does not go away or is severe. You may notice the shells of the tablets in your stool from time to time. This is normal. Avoid extreme heat. This medicine can cause you to sweat less than normal. Your body temperature could increase to dangerous levels, which may lead to heat stroke. What side effects may I notice from receiving this medicine? Side effects  that you should report to your doctor or health care professional as soon as possible: -allergic reactions like skin rash, itching or hives, swelling of the face, lips, or tongue -agitation -breathing problems -confusion -fever -flushing (reddening of the skin) -hallucinations -memory loss -pain or difficulty passing urine -palpitations -unusually weak or tired Side effects that usually do not require medical attention (report to your doctor or health care professional if they continue or are bothersome): -constipation -headache -sexual difficulties (impotence) This list may not describe all possible side effects. Call your doctor for medical advice about side effects. You may report side effects to FDA at 1-800-FDA-1088. Where should I keep my medicine? Keep out of the reach of children. Store at room temperature between 15 and 30 degrees C (59 and 86 degrees F). Protect from moisture and humidity. Throw away any unused medicine after the expiration date. NOTE: This sheet is a summary. It may not cover all possible information. If you have questions about this medicine, talk to your doctor,  pharmacist, or health care provider.  2018 Elsevier/Gold Standard (2013-09-30 10:57:06)  Bacterial Vaginosis Bacterial vaginosis is a vaginal infection that occurs when the normal balance of bacteria in the vagina is disrupted. It results from an overgrowth of certain bacteria. This is the most common vaginal infection among women ages 21-44. Because bacterial vaginosis increases your risk for STIs (sexually transmitted infections), getting treated can help reduce your risk for chlamydia, gonorrhea, herpes, and HIV (human immunodeficiency virus). Treatment is also important for preventing complications in pregnant women, because this condition can cause an early (premature) delivery. What are the causes? This condition is caused by an increase in harmful bacteria that are normally present in small  amounts in the vagina. However, the reason that the condition develops is not fully understood. What increases the risk? The following factors may make you more likely to develop this condition:  Having a new sexual partner or multiple sexual partners.  Having unprotected sex.  Douching.  Having an intrauterine device (IUD).  Smoking.  Drug and alcohol abuse.  Taking certain antibiotic medicines.  Being pregnant. You cannot get bacterial vaginosis from toilet seats, bedding, swimming pools, or contact with objects around you. What are the signs or symptoms? Symptoms of this condition include:  Grey or white vaginal discharge. The discharge can also be watery or foamy.  A fish-like odor with discharge, especially after sexual intercourse or during menstruation.  Itching in and around the vagina.  Burning or pain with urination. Some women with bacterial vaginosis have no signs or symptoms. How is this diagnosed? This condition is diagnosed based on:  Your medical history.  A physical exam of the vagina.  Testing a sample of vaginal fluid under a microscope to look for a large amount of bad bacteria or abnormal cells. Your health care provider may use a cotton swab or a small wooden spatula to collect the sample. How is this treated? This condition is treated with antibiotics. These may be given as a pill, a vaginal cream, or a medicine that is put into the vagina (suppository). If the condition comes back after treatment, a second round of antibiotics may be needed. Follow these instructions at home: Medicines   Take over-the-counter and prescription medicines only as told by your health care provider.  Take or use your antibiotic as told by your health care provider. Do not stop taking or using the antibiotic even if you start to feel better. General instructions   If you have a female sexual partner, tell her that you have a vaginal infection. She should see her  health care provider and be treated if she has symptoms. If you have a female sexual partner, he does not need treatment.  During treatment:  Avoid sexual activity until you finish treatment.  Do not douche.  Avoid alcohol as directed by your health care provider.  Avoid breastfeeding as directed by your health care provider.  Drink enough water and fluids to keep your urine clear or pale yellow.  Keep the area around your vagina and rectum clean.  Wash the area daily with warm water.  Wipe yourself from front to back after using the toilet.  Keep all follow-up visits as told by your health care provider. This is important. How is this prevented?  Do not douche.  Wash the outside of your vagina with warm water only.  Use protection when having sex. This includes latex condoms and dental dams.  Limit how many sexual partners you have. To  help prevent bacterial vaginosis, it is best to have sex with just one partner (monogamous).  Make sure you and your sexual partner are tested for STIs.  Wear cotton or cotton-lined underwear.  Avoid wearing tight pants and pantyhose, especially during summer.  Limit the amount of alcohol that you drink.  Do not use any products that contain nicotine or tobacco, such as cigarettes and e-cigarettes. If you need help quitting, ask your health care provider.  Do not use illegal drugs. Where to find more information:  Centers for Disease Control and Prevention: AppraiserFraud.fi  American Sexual Health Association (ASHA): www.ashastd.org  [place] Department of Health and Financial controller, Office on Women's Health: DustingSprays.pl or SecuritiesCard.it Contact a health care provider if:  Your symptoms do not improve, even after treatment.  You have more discharge or pain when urinating.  You have a fever.  You have pain in your abdomen.  You have pain during sex.  You have vaginal bleeding  between periods. Summary  Bacterial vaginosis is a vaginal infection that occurs when the normal balance of bacteria in the vagina is disrupted.  Because bacterial vaginosis increases your risk for STIs (sexually transmitted infections), getting treated can help reduce your risk for chlamydia, gonorrhea, herpes, and HIV (human immunodeficiency virus). Treatment is also important for preventing complications in pregnant women, because the condition can cause an early (premature) delivery.  This condition is treated with antibiotic medicines. These may be given as a pill, a vaginal cream, or a medicine that is put into the vagina (suppository). This information is not intended to replace advice given to you by your health care provider. Make sure you discuss any questions you have with your health care provider. Document Released: 07/15/2005 Document Revised: 03/30/2016 Document Reviewed: 03/30/2016 Elsevier Interactive Patient Education  2017 [employer].

## 2016-09-28 LAB — VITAMIN D 25 HYDROXY (VIT D DEFICIENCY, FRACTURES): Vit D, 25-Hydroxy: 76 ng/mL (ref 30–100)

## 2016-10-01 ENCOUNTER — Telehealth: Payer: Self-pay | Admitting: Gynecologic Oncology

## 2016-10-01 NOTE — Telephone Encounter (Signed)
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 Has not yet started flagyl (due to delay in getting Rx filled).  Most concerned with symptoms of her hip replacement and discontent with that particular hospitalization.  I explained that there must have been an error in the surgical counts. I apologized for the error and any discomfort or implications of this. I discussed that we investigated the reasons for why this might have happened and have developed system strategies to improve the likelihood of correct accounting for the koh rings intraoperatively.  Offered my assistance and my willingness to help out in any way I can.  Donaciano Eva, MD

## 2016-10-03 ENCOUNTER — Encounter (HOSPITAL_COMMUNITY): Payer: Self-pay | Admitting: Gynecologic Oncology

## 2016-10-04 DIAGNOSIS — Z1231 Encounter for screening mammogram for malignant neoplasm of breast: Secondary | ICD-10-CM | POA: Diagnosis not present

## 2016-10-04 DIAGNOSIS — I10 Essential (primary) hypertension: Secondary | ICD-10-CM | POA: Diagnosis not present

## 2016-10-04 DIAGNOSIS — K760 Fatty (change of) liver, not elsewhere classified: Secondary | ICD-10-CM | POA: Diagnosis not present

## 2016-10-04 DIAGNOSIS — E785 Hyperlipidemia, unspecified: Secondary | ICD-10-CM | POA: Diagnosis not present

## 2016-10-04 DIAGNOSIS — E559 Vitamin D deficiency, unspecified: Secondary | ICD-10-CM | POA: Diagnosis not present

## 2016-10-04 DIAGNOSIS — E669 Obesity, unspecified: Secondary | ICD-10-CM | POA: Diagnosis not present

## 2016-10-04 DIAGNOSIS — R7303 Prediabetes: Secondary | ICD-10-CM | POA: Diagnosis not present

## 2016-10-04 DIAGNOSIS — M8580 Other specified disorders of bone density and structure, unspecified site: Secondary | ICD-10-CM | POA: Diagnosis not present

## 2016-10-04 DIAGNOSIS — Z9181 History of falling: Secondary | ICD-10-CM | POA: Diagnosis not present

## 2016-10-04 DIAGNOSIS — E039 Hypothyroidism, unspecified: Secondary | ICD-10-CM | POA: Diagnosis not present

## 2016-10-04 DIAGNOSIS — Z1389 Encounter for screening for other disorder: Secondary | ICD-10-CM | POA: Diagnosis not present

## 2016-10-04 DIAGNOSIS — Z6834 Body mass index (BMI) 34.0-34.9, adult: Secondary | ICD-10-CM | POA: Diagnosis not present

## 2016-10-23 DIAGNOSIS — M25551 Pain in right hip: Secondary | ICD-10-CM | POA: Diagnosis not present

## 2016-10-23 DIAGNOSIS — G8929 Other chronic pain: Secondary | ICD-10-CM | POA: Diagnosis not present

## 2016-10-23 DIAGNOSIS — Z96642 Presence of left artificial hip joint: Secondary | ICD-10-CM | POA: Diagnosis not present

## 2016-10-23 DIAGNOSIS — Z471 Aftercare following joint replacement surgery: Secondary | ICD-10-CM | POA: Diagnosis not present

## 2016-10-30 DIAGNOSIS — Z1231 Encounter for screening mammogram for malignant neoplasm of breast: Secondary | ICD-10-CM | POA: Diagnosis not present

## 2016-10-31 DIAGNOSIS — H52223 Regular astigmatism, bilateral: Secondary | ICD-10-CM | POA: Diagnosis not present

## 2016-11-01 ENCOUNTER — Other Ambulatory Visit (HOSPITAL_COMMUNITY): Payer: Self-pay | Admitting: Nurse Practitioner

## 2016-11-01 DIAGNOSIS — K760 Fatty (change of) liver, not elsewhere classified: Secondary | ICD-10-CM | POA: Diagnosis not present

## 2016-11-01 DIAGNOSIS — R748 Abnormal levels of other serum enzymes: Secondary | ICD-10-CM

## 2016-11-01 DIAGNOSIS — K7581 Nonalcoholic steatohepatitis (NASH): Secondary | ICD-10-CM

## 2016-11-04 ENCOUNTER — Other Ambulatory Visit: Payer: Self-pay | Admitting: Nurse Practitioner

## 2016-11-04 DIAGNOSIS — K7581 Nonalcoholic steatohepatitis (NASH): Secondary | ICD-10-CM

## 2016-11-04 DIAGNOSIS — R748 Abnormal levels of other serum enzymes: Secondary | ICD-10-CM

## 2016-11-06 DIAGNOSIS — H52203 Unspecified astigmatism, bilateral: Secondary | ICD-10-CM | POA: Diagnosis not present

## 2016-11-06 DIAGNOSIS — H579 Unspecified disorder of eye and adnexa: Secondary | ICD-10-CM | POA: Diagnosis not present

## 2016-11-06 DIAGNOSIS — H52223 Regular astigmatism, bilateral: Secondary | ICD-10-CM | POA: Diagnosis not present

## 2016-11-07 ENCOUNTER — Ambulatory Visit
Admission: RE | Admit: 2016-11-07 | Discharge: 2016-11-07 | Disposition: A | Discharge status: Home / Self Care | Payer: Medicare Other | Source: Ambulatory Visit | Attending: Nurse Practitioner | Admitting: Nurse Practitioner

## 2016-11-07 DIAGNOSIS — K7581 Nonalcoholic steatohepatitis (NASH): Secondary | ICD-10-CM | POA: Diagnosis not present

## 2016-11-07 DIAGNOSIS — R748 Abnormal levels of other serum enzymes: Secondary | ICD-10-CM | POA: Diagnosis not present

## 2016-11-07 DIAGNOSIS — K760 Fatty (change of) liver, not elsewhere classified: Secondary | ICD-10-CM | POA: Diagnosis not present

## 2016-11-16 DIAGNOSIS — M961 Postlaminectomy syndrome, not elsewhere classified: Secondary | ICD-10-CM | POA: Diagnosis not present

## 2016-11-16 DIAGNOSIS — R200 Anesthesia of skin: Secondary | ICD-10-CM | POA: Diagnosis not present

## 2016-11-16 DIAGNOSIS — M47812 Spondylosis without myelopathy or radiculopathy, cervical region: Secondary | ICD-10-CM | POA: Diagnosis not present

## 2016-12-02 DIAGNOSIS — R200 Anesthesia of skin: Secondary | ICD-10-CM | POA: Diagnosis not present

## 2016-12-02 DIAGNOSIS — M79641 Pain in right hand: Secondary | ICD-10-CM | POA: Diagnosis not present

## 2016-12-11 ENCOUNTER — Encounter: Payer: Self-pay | Admitting: Gynecology

## 2017-01-01 DIAGNOSIS — R200 Anesthesia of skin: Secondary | ICD-10-CM | POA: Diagnosis not present

## 2017-01-01 DIAGNOSIS — G5601 Carpal tunnel syndrome, right upper limb: Secondary | ICD-10-CM | POA: Diagnosis not present

## 2017-01-10 DIAGNOSIS — G5601 Carpal tunnel syndrome, right upper limb: Secondary | ICD-10-CM | POA: Diagnosis not present

## 2017-01-16 ENCOUNTER — Inpatient Hospital Stay (HOSPITAL_COMMUNITY)
Admission: AD | Admit: 2017-01-16 | Discharge: 2017-01-18 | DRG: 149 | Disposition: A | Discharge status: Home Health | Payer: Medicare Other | Source: Other Acute Inpatient Hospital | Attending: Internal Medicine | Admitting: Internal Medicine

## 2017-01-16 ENCOUNTER — Inpatient Hospital Stay (HOSPITAL_COMMUNITY): Payer: Medicare Other

## 2017-01-16 ENCOUNTER — Encounter (HOSPITAL_COMMUNITY): Payer: Self-pay | Admitting: Physician Assistant

## 2017-01-16 DIAGNOSIS — I10 Essential (primary) hypertension: Secondary | ICD-10-CM | POA: Diagnosis not present

## 2017-01-16 DIAGNOSIS — R112 Nausea with vomiting, unspecified: Secondary | ICD-10-CM | POA: Diagnosis present

## 2017-01-16 DIAGNOSIS — H9319 Tinnitus, unspecified ear: Secondary | ICD-10-CM | POA: Diagnosis present

## 2017-01-16 DIAGNOSIS — Z79899 Other long term (current) drug therapy: Secondary | ICD-10-CM | POA: Diagnosis not present

## 2017-01-16 DIAGNOSIS — H8110 Benign paroxysmal vertigo, unspecified ear: Principal | ICD-10-CM | POA: Diagnosis present

## 2017-01-16 DIAGNOSIS — H5509 Other forms of nystagmus: Secondary | ICD-10-CM | POA: Diagnosis present

## 2017-01-16 DIAGNOSIS — Z882 Allergy status to sulfonamides status: Secondary | ICD-10-CM | POA: Diagnosis not present

## 2017-01-16 DIAGNOSIS — R739 Hyperglycemia, unspecified: Secondary | ICD-10-CM | POA: Diagnosis present

## 2017-01-16 DIAGNOSIS — I129 Hypertensive chronic kidney disease with stage 1 through stage 4 chronic kidney disease, or unspecified chronic kidney disease: Secondary | ICD-10-CM | POA: Diagnosis present

## 2017-01-16 DIAGNOSIS — M47812 Spondylosis without myelopathy or radiculopathy, cervical region: Secondary | ICD-10-CM | POA: Diagnosis present

## 2017-01-16 DIAGNOSIS — K760 Fatty (change of) liver, not elsewhere classified: Secondary | ICD-10-CM | POA: Insufficient documentation

## 2017-01-16 DIAGNOSIS — M8580 Other specified disorders of bone density and structure, unspecified site: Secondary | ICD-10-CM | POA: Diagnosis present

## 2017-01-16 DIAGNOSIS — E039 Hypothyroidism, unspecified: Secondary | ICD-10-CM | POA: Diagnosis present

## 2017-01-16 DIAGNOSIS — R110 Nausea: Secondary | ICD-10-CM | POA: Diagnosis not present

## 2017-01-16 DIAGNOSIS — M1990 Unspecified osteoarthritis, unspecified site: Secondary | ICD-10-CM | POA: Diagnosis present

## 2017-01-16 DIAGNOSIS — Z96649 Presence of unspecified artificial hip joint: Secondary | ICD-10-CM | POA: Diagnosis present

## 2017-01-16 DIAGNOSIS — R42 Dizziness and giddiness: Secondary | ICD-10-CM

## 2017-01-16 DIAGNOSIS — R0902 Hypoxemia: Secondary | ICD-10-CM | POA: Diagnosis not present

## 2017-01-16 DIAGNOSIS — J302 Other seasonal allergic rhinitis: Secondary | ICD-10-CM | POA: Diagnosis present

## 2017-01-16 DIAGNOSIS — E785 Hyperlipidemia, unspecified: Secondary | ICD-10-CM | POA: Diagnosis present

## 2017-01-16 DIAGNOSIS — J42 Unspecified chronic bronchitis: Secondary | ICD-10-CM | POA: Diagnosis present

## 2017-01-16 DIAGNOSIS — Z8701 Personal history of pneumonia (recurrent): Secondary | ICD-10-CM | POA: Diagnosis not present

## 2017-01-16 DIAGNOSIS — N189 Chronic kidney disease, unspecified: Secondary | ICD-10-CM | POA: Diagnosis present

## 2017-01-16 DIAGNOSIS — G43A0 Cyclical vomiting, not intractable: Secondary | ICD-10-CM | POA: Diagnosis not present

## 2017-01-16 DIAGNOSIS — R011 Cardiac murmur, unspecified: Secondary | ICD-10-CM | POA: Diagnosis not present

## 2017-01-16 DIAGNOSIS — B001 Herpesviral vesicular dermatitis: Secondary | ICD-10-CM | POA: Diagnosis present

## 2017-01-16 DIAGNOSIS — Z888 Allergy status to other drugs, medicaments and biological substances status: Secondary | ICD-10-CM

## 2017-01-16 DIAGNOSIS — K219 Gastro-esophageal reflux disease without esophagitis: Secondary | ICD-10-CM | POA: Diagnosis not present

## 2017-01-16 DIAGNOSIS — R531 Weakness: Secondary | ICD-10-CM | POA: Diagnosis not present

## 2017-01-16 HISTORY — DX: Migraine, unspecified, not intractable, without status migrainosus: G43.909

## 2017-01-16 HISTORY — DX: Dizziness and giddiness: R42

## 2017-01-16 LAB — URINALYSIS, ROUTINE W REFLEX MICROSCOPIC
BILIRUBIN URINE: NEGATIVE
Glucose, UA: NEGATIVE mg/dL
HGB URINE DIPSTICK: NEGATIVE
Ketones, ur: NEGATIVE mg/dL
Leukocytes, UA: NEGATIVE
Nitrite: NEGATIVE
PH: 7 (ref 5.0–8.0)
Protein, ur: NEGATIVE mg/dL
SPECIFIC GRAVITY, URINE: 1.013 (ref 1.005–1.030)

## 2017-01-16 LAB — CBC WITH DIFFERENTIAL/PLATELET
BASOS ABS: 0 10*3/uL (ref 0.0–0.1)
Basophils Relative: 0 %
EOS PCT: 0 %
Eosinophils Absolute: 0 10*3/uL (ref 0.0–0.7)
HEMATOCRIT: 37.9 % (ref 36.0–46.0)
HEMOGLOBIN: 12.3 g/dL (ref 12.0–15.0)
LYMPHS ABS: 0.9 10*3/uL (ref 0.7–4.0)
Lymphocytes Relative: 14 %
MCH: 31.8 pg (ref 26.0–34.0)
MCHC: 32.5 g/dL (ref 30.0–36.0)
MCV: 97.9 fL (ref 78.0–100.0)
Monocytes Absolute: 0.2 10*3/uL (ref 0.1–1.0)
Monocytes Relative: 3 %
NEUTROS ABS: 5.4 10*3/uL (ref 1.7–7.7)
NEUTROS PCT: 83 %
PLATELETS: 214 10*3/uL (ref 150–400)
RBC: 3.87 MIL/uL (ref 3.87–5.11)
RDW: 15.1 % (ref 11.5–15.5)
WBC: 6.6 10*3/uL (ref 4.0–10.5)

## 2017-01-16 LAB — COMPREHENSIVE METABOLIC PANEL
ALT: 51 U/L (ref 14–54)
ANION GAP: 6 (ref 5–15)
AST: 80 U/L — ABNORMAL HIGH (ref 15–41)
Albumin: 3.3 g/dL — ABNORMAL LOW (ref 3.5–5.0)
Alkaline Phosphatase: 111 U/L (ref 38–126)
BILIRUBIN TOTAL: 0.6 mg/dL (ref 0.3–1.2)
BUN: 7 mg/dL (ref 6–20)
CHLORIDE: 109 mmol/L (ref 101–111)
CO2: 26 mmol/L (ref 22–32)
Calcium: 8.5 mg/dL — ABNORMAL LOW (ref 8.9–10.3)
Creatinine, Ser: 0.57 mg/dL (ref 0.44–1.00)
Glucose, Bld: 117 mg/dL — ABNORMAL HIGH (ref 65–99)
POTASSIUM: 3.8 mmol/L (ref 3.5–5.1)
Sodium: 141 mmol/L (ref 135–145)
TOTAL PROTEIN: 5.9 g/dL — AB (ref 6.5–8.1)

## 2017-01-16 LAB — GLUCOSE, CAPILLARY
Glucose-Capillary: 111 mg/dL — ABNORMAL HIGH (ref 65–99)
Glucose-Capillary: 94 mg/dL (ref 65–99)

## 2017-01-16 LAB — PHOSPHORUS: Phosphorus: 3.5 mg/dL (ref 2.5–4.6)

## 2017-01-16 LAB — MAGNESIUM: Magnesium: 1.8 mg/dL (ref 1.7–2.4)

## 2017-01-16 LAB — TROPONIN I: Troponin I: <0.03 ng/mL (ref <0.03)

## 2017-01-16 LAB — TSH: TSH: 2.26 u[IU]/mL (ref 0.350–4.500)

## 2017-01-16 MED ORDER — ACETAMINOPHEN 325 MG PO TABS
650.0000 mg | ORAL_TABLET | Freq: Four times a day (QID) | ORAL | Status: DC | PRN
  Administered 2017-01-16 – 2017-01-17 (×2): 650 mg via ORAL
  Filled 2017-01-16 (×2): qty 2

## 2017-01-16 MED ORDER — PANTOPRAZOLE SODIUM 40 MG PO TBEC
40.0000 mg | DELAYED_RELEASE_TABLET | Freq: Every day | ORAL | Status: DC
Start: 2017-01-16 — End: 2017-01-18
  Administered 2017-01-16 – 2017-01-18 (×3): 40 mg via ORAL
  Filled 2017-01-16 (×3): qty 1

## 2017-01-16 MED ORDER — ONDANSETRON HCL 4 MG PO TABS: 4.0000 mg | ORAL_TABLET | Freq: Four times a day (QID) | ORAL | Status: DC | PRN

## 2017-01-16 MED ORDER — SODIUM CHLORIDE 0.9% FLUSH
3.0000 mL | Freq: Two times a day (BID) | INTRAVENOUS | Status: DC
  Administered 2017-01-16 – 2017-01-17 (×3): 3 mL via INTRAVENOUS

## 2017-01-16 MED ORDER — ACYCLOVIR 800 MG PO TABS
400.0000 mg | ORAL_TABLET | Freq: Every day | ORAL | Status: DC
  Administered 2017-01-16 – 2017-01-18 (×3): 400 mg via ORAL
  Filled 2017-01-16 (×3): qty 1

## 2017-01-16 MED ORDER — HYDRALAZINE HCL 20 MG/ML IJ SOLN: 5.0000 mg | Freq: Three times a day (TID) | INTRAMUSCULAR | Status: DC | PRN

## 2017-01-16 MED ORDER — DIAZEPAM 5 MG/ML IJ SOLN
5.0000 mg | Freq: Once | INTRAMUSCULAR | Status: AC
  Administered 2017-01-16: 5 mg via INTRAVENOUS
  Filled 2017-01-16: qty 2

## 2017-01-16 MED ORDER — PROCHLORPERAZINE EDISYLATE 5 MG/ML IJ SOLN: 5.0000 mg | Freq: Four times a day (QID) | INTRAMUSCULAR | Status: DC | PRN

## 2017-01-16 MED ORDER — SODIUM CHLORIDE 0.9 % IV SOLN
INTRAVENOUS | Status: DC
  Administered 2017-01-16: 17:00:00 via INTRAVENOUS
  Administered 2017-01-16: 125 mL/h via INTRAVENOUS
  Administered 2017-01-17: 1000 mL via INTRAVENOUS

## 2017-01-16 MED ORDER — SODIUM CHLORIDE 0.9 % IV BOLUS (SEPSIS)
500.0000 mL | Freq: Once | INTRAVENOUS | Status: AC
  Administered 2017-01-16: 500 mL via INTRAVENOUS

## 2017-01-16 MED ORDER — SIMVASTATIN 40 MG PO TABS
40.0000 mg | ORAL_TABLET | Freq: Every day | ORAL | Status: DC
  Administered 2017-01-16 – 2017-01-17 (×2): 40 mg via ORAL
  Filled 2017-01-16 (×3): qty 1

## 2017-01-16 MED ORDER — BISACODYL 10 MG RE SUPP: 10.0000 mg | Freq: Every day | RECTAL | Status: DC | PRN

## 2017-01-16 MED ORDER — ONDANSETRON HCL 4 MG/2ML IJ SOLN: 4.0000 mg | Freq: Four times a day (QID) | INTRAMUSCULAR | Status: DC | PRN

## 2017-01-16 MED ORDER — ACETAMINOPHEN 650 MG RE SUPP: 650.0000 mg | Freq: Four times a day (QID) | RECTAL | Status: DC | PRN

## 2017-01-16 MED ORDER — LEVOTHYROXINE SODIUM 75 MCG PO TABS
75.0000 ug | ORAL_TABLET | Freq: Every day | ORAL | Status: DC
  Administered 2017-01-17 – 2017-01-18 (×2): 75 ug via ORAL
  Filled 2017-01-16 (×2): qty 1

## 2017-01-16 MED ORDER — SENNOSIDES-DOCUSATE SODIUM 8.6-50 MG PO TABS: 1.0000 | ORAL_TABLET | Freq: Every evening | ORAL | Status: DC | PRN

## 2017-01-16 NOTE — Progress Notes (Signed)
-   Telemetry monitoring 

## 2017-01-16 NOTE — H&P (Signed)
History and Physical    Emma Lewis BWG:[id number] DOB: 1951-03-22 DOA: 01/16/2017   PCP: Philmore Pali, NP   Patient coming from:  Home    Chief Complaint:  HPI: Emma Lewis is a 66 y.o. female with medical history significant for HTN, HLD, CKD, Chronic bronchitis, "fever blisters" on chronic Acyclovir, Hypothyroidism, s/p cervical disc surgery 2015, pelvic mass s/p salpingohorectomy, and recent right carpal tunnel surgery last Friday with post op n/v , history of vertigo, history of migraines, brought from [facility] as she presented with vertigo, dizziness, dry heaves acute, since this morning. She had never had similar episodes. It is constant, not getting worse, but is exacerbated with some head movements, and opening her eyes. She denies any make pain. She denies any recent upper respiratory infection or flulike symptoms.  She denies any fever chills or night sweats..She denies any history of stroke or heart disease. The patient received medical sign, as well as Zofran, without significant improvement of her symptoms. She denies any unilateral weakness, dysphagia, dysarthria, or unsteady gait. She denies any history of labyrinthitis. No recent long distance trip. She denies any blurred vision or double vision. She denies any decrease hearing, but she does have a history of tinnitus. She denies any seizures or confusion. She denies any abdominal pain or diarrhea. She denies any sick contacts. She denies any abnormal skin rashes. She does not take many medications, but she is compliant with them. She was last seen normal around early this morning.   ED Course:  BP (!) 156/96 (BP Location: Left Arm)   Pulse 97   Temp 97.9 F (36.6 C) (Oral)   Resp 20   Ht 5\' 5"  (1.651 m)   Wt 98.7 kg (217 lb 11.2 oz)   SpO2 96%   BMI 36.23 kg/m    All workup is pending including MRI of the brain CT of the head without contrast at [facility] was negative for acute intracranial findings. Chest  x-ray at [facility], was negative for acute findings either. Troponin, TSH, CBC and CMET are pending. EKG is pending.   Review of Systems:  As per HPI otherwise all other systems reviewed and are negative  Past Medical History:  Diagnosis Date  . Arthritis   . Bronchitis    hx of  . Fatty liver   . Fever blister    Takes Acyclovir  . GERD (gastroesophageal reflux disease)   . Hyperlipemia   . Hypothyroidism   . Osteopenia   . Pneumonia    hx of     YRS AGO  . PONV (postoperative nausea and vomiting)   . Seasonal allergies     Past Surgical History:  Procedure Laterality Date  . ANTERIOR CERVICAL DECOMP/DISCECTOMY FUSION N/A 02/09/2014   Procedure: ANTERIOR CERVICAL DECOMPRESSION/DISCECTOMY FUSION 2 LEVELS;  Surgeon: [name], MD;  Location: [place];  Service: Orthopedics;  Laterality: N/A;  C4-5, C5-6 Anterior Cervical Discectomy and Fusion, Allograft, Plate  . BACK SURGERY     lumbar fusion  X2  . CATARACT EXTRACTION W/ INTRAOCULAR LENS  IMPLANT, BILATERAL    . COLONOSCOPY    . GANGLION CYST EXCISION Left 1970s   wrist  . HIP ARTHROPLASTY Left 2011  . JOINT REPLACEMENT    . KNEE SURGERY Right    3 TOTAL  . ROBOTIC ASSISTED BILATERAL SALPINGO OOPHERECTOMY Bilateral 08/01/2015   Procedure: ROBOTIC ASSISTED BILATERAL SALPINGO OOPHORECTOMY;  Surgeon: Everitt Amber, MD;  Location: [facility];  Service: Gynecology;  Laterality: Bilateral;  . SHOULDER OPEN ROTATOR CUFF REPAIR Right    3 TOTAL  . SHOULDER SURGERY Right   . TOTAL HIP REVISION Left 06/10/2016   Procedure: TOTAL HIP REVISION ARTHROPLASTY;  Surgeon: Rod Can, MD;  Location: [place];  Service: Orthopedics;  Laterality: Left;    Social History Social History   Social History  . Marital status: Married    Spouse name: N/A  . Number of children: N/A  . Years of education: N/A   Occupational History  . Not on file.   Social History Main Topics  . Smoking status: Never Smoker  . Smokeless tobacco: Never Used    . Alcohol use Yes     Comment: rare  . Drug use: No  . Sexual activity: Not Currently   Other Topics Concern  . Not on file   Social History Narrative  . No narrative on file     Allergies  Allergen Reactions  . Sulfa Antibiotics Other (See Comments)    Mother said "I liked to have died"  . Simvastatin Other (See Comments)    Fatty liver disease  . Fenofibrate Rash    She didn't feel right    Family History  Problem Relation Age of Onset  . Lymphoma Mother       Prior to Admission medications   Medication Sig Start Date End Date Taking? Authorizing Provider  acyclovir (ZOVIRAX) 400 MG tablet Take 400 mg by mouth as directed. Take 1 tablet daily unless flare up then takes twice daily for 3 days    [provider]  Artificial Tear Solution (SOOTHE XP) SOLN Place 1 drop into both eyes daily as needed (dry eyes).    [provider]  Calcium Carb-Cholecalciferol (CALCIUM 600 + D PO) Take 1 tablet by mouth 3 (three) times daily.     [provider]  clindamycin (CLEOCIN) 2 % vaginal cream Place 1 Applicatorful vaginally at bedtime. 09/27/16   Terrance Mass, MD  docusate sodium (COLACE) 100 MG capsule Take 1 capsule (100 mg total) by mouth 2 (two) times daily. 06/11/16   Swinteck, Aaron Edelman, MD  Evening Primrose Oil 500 MG CAPS Take 1 capsule by mouth 3 (three) times daily.    [provider]  HYDROcodone-acetaminophen (NORCO) 7.5-325 MG tablet Take 1-2 tablets by mouth every 4 (four) hours as needed (breakthrough pain). 06/11/16   Swinteck, Aaron Edelman, MD  ibuprofen (ADVIL,MOTRIN) 200 MG tablet Take 800 mg by mouth every 8 (eight) hours as needed (for pain).     [provider]  KRILL OIL PO Take 1 tablet by mouth daily.    [provider]  levothyroxine (SYNTHROID, LEVOTHROID) 75 MCG tablet Take 75 mcg by mouth daily before breakfast.    [provider]  lidocaine (ASPERCREME W/LIDOCAINE) 4 % cream Apply 1 application  topically as needed.    [provider]  Magnesium 125 MG CAPS Take 1 capsule by mouth 3 (three) times daily.    [provider]  Multiple Vitamins-Minerals (EMERGEN-C VITAMIN C) PACK Take 1 Package by mouth daily.    [provider]  omeprazole (PRILOSEC) 20 MG capsule Take 10 mg by mouth daily.     [provider]  simvastatin (ZOCOR) 40 MG tablet Take 40 mg by mouth daily.    [provider]  Specialty Vitamins Products (BIOTIN PLUS KERATIN) 10000-100 MCG-MG TABS Take 1 tablet by mouth daily.    [provider]    Physical Exam:  Vitals:   01/16/17 1530  BP: (!) 156/96  Pulse: 97  Resp: 20  Temp: 97.9 F (36.6 C)  TempSrc: Oral  SpO2: 96%  Weight: 98.7 kg (217 lb 11.2 oz)  Height: 5\' 5"  (1.651 m)   Constitutional:  Very uncomfortable, DC with movement, her eyes remain close, as when open, exacerbates for dizziness. Eyes: PERRL, lids and conjunctivae normal. Horizontal nystagmus noted ENMT: Mucous membranes are moist, without exudate or lesions  Neck: normal, supple, no masses, no thyromegaly Respiratory: clear to auscultation bilaterally, no wheezing, no crackles. Mild chronic at the ecstatic changes on the left Normal respiratory effort  Cardiovascular: Regular rate and rhythm, very soft 1 out of 6 systolic murmur rubs or gallops. No extremity edema. 2+ pedal pulses. No carotid bruits.  Abdomen: Soft, non tender, No hepatosplenomegaly. Bowel sounds positive.  Musculoskeletal: no clubbing / cyanosis. Moves all extremities. Left cast in upper extremity at the carpal tunnel surgical site  Skin: no jaundice, No lesions.  Neurologic: Sensation intact  Strength equal in all extremities Psychiatric:   Alert and oriented x 3. Normal mood.     Labs on Admission: I have personally reviewed following labs and imaging studies  CBC: No results for input(s): WBC, NEUTROABS, HGB, HCT, MCV, PLT in the last 168 hours.  Basic Metabolic  Panel: No results for input(s): NA, K, CL, CO2, GLUCOSE, BUN, CREATININE, CALCIUM, MG, PHOS in the last 168 hours.  GFR: CrCl cannot be calculated (Patient's most recent lab result is older than the maximum 21 days allowed.).  Liver Function Tests: No results for input(s): AST, ALT, ALKPHOS, BILITOT, PROT, ALBUMIN in the last 168 hours. No results for input(s): LIPASE, AMYLASE in the last 168 hours. No results for input(s): AMMONIA in the last 168 hours.  Coagulation Profile: No results for input(s): INR, PROTIME in the last 168 hours.  Cardiac Enzymes: No results for input(s): CKTOTAL, CKMB, CKMBINDEX, TROPONINI in the last 168 hours.  BNP (last 3 results) No results for input(s): PROBNP in the last 8760 hours.  HbA1C: No results for input(s): HGBA1C in the last 72 hours.  CBG: No results for input(s): GLUCAP in the last 168 hours.  Lipid Profile: No results for input(s): CHOL, HDL, LDLCALC, TRIG, CHOLHDL, LDLDIRECT in the last 72 hours.  Thyroid Function Tests: No results for input(s): TSH, T4TOTAL, FREET4, T3FREE, THYROIDAB in the last 72 hours.  Anemia Panel: No results for input(s): VITAMINB12, FOLATE, FERRITIN, TIBC, IRON, RETICCTPCT in the last 72 hours.  Urine analysis:    Component Value Date/Time   COLORURINE YELLOW 02/02/2014 1055   APPEARANCEUR CLOUDY (A) 02/02/2014 1055   LABSPEC 1.016 02/02/2014 1055   PHURINE 5.5 02/02/2014 1055   GLUCOSEU NEGATIVE 02/02/2014 1055   HGBUR NEGATIVE 02/02/2014 1055   BILIRUBINUR NEGATIVE 02/02/2014 1055   KETONESUR NEGATIVE 02/02/2014 1055   PROTEINUR NEGATIVE 02/02/2014 1055   UROBILINOGEN 0.2 02/02/2014 1055   NITRITE NEGATIVE 02/02/2014 1055   LEUKOCYTESUR SMALL (A) 02/02/2014 1055    Sepsis Labs: @LABRCNTIP (procalcitonin:4,lacticidven:4) )No results found for this or any previous visit (from the past 240 hour(s)).   Radiological Exams on Admission: No results found.  EKG: Independently reviewed.    Assessment/Plan Active Problems:   Vertigo   Nausea without vomiting   Cervical spondylosis   Pelvic mass   Failed total hip arthroplasty (HCC)   Essential hypertension    Intractable nausea and vomiting  since this morning, in a patient with a history of vertigo and migraines .  vomiting when opening eyes . Horizontal nystagmus noted   Received Zofran IV and Meclizine . CT head at [place] neg for acute findings   Telemetry Inpatient   Valium 5 mg x1, - Zofran and compazine prn   IVF  Discussed with Neuro, recommending MRI brain to rule out stroke vs bleeding. If abnormal, or symptoms worsen will consult  EKG, Tn  2 D echo   Check TSH  Urinalysis to r/o UTI    Hyperglycemia, no prior h/o DM CHeck A1C and monitor CBG    Hypertension BP  156/96   Pulse 97  NOt on home meds  Add Hydralazine Q6 hours as needed for BP 210/110 Will need to follow as OP with PCP   Hyperlipidemia Continue home statins  GERD, no acute symptoms Continue PPI  Hypothyroidism: Continue home Synthroid Ceeck TSH   History of "fever blisters" on chronic antiviral COntinue ZOvirax as instructed   DVT prophylaxis:  SCD's   Code Status:   Full    Family Communication:  Discussed with patient Disposition Plan: Expect patient to be discharged to home after condition improves Consults called:    None  Admission status:Tele  Inpatient     [facility] E, PA-C [facility]   01/16/2017, 5:20 PM

## 2017-01-16 NOTE — Progress Notes (Signed)
Pt received at 15:28pm from [facility] ED. Pt stable, neuro intact. Pt complains of nausea. Pt with her eyes closed stated that she feels dizzy when you she opens her eyes. Safety measures in place. Pt oriented to room. Will continue to monitor.

## 2017-01-16 NOTE — Progress Notes (Signed)
Pt unable to tolerate orthostatic assessment. Md aware.

## 2017-01-17 ENCOUNTER — Inpatient Hospital Stay (HOSPITAL_COMMUNITY): Payer: Medicare Other

## 2017-01-17 DIAGNOSIS — R011 Cardiac murmur, unspecified: Secondary | ICD-10-CM

## 2017-01-17 DIAGNOSIS — K219 Gastro-esophageal reflux disease without esophagitis: Secondary | ICD-10-CM | POA: Diagnosis present

## 2017-01-17 DIAGNOSIS — I10 Essential (primary) hypertension: Secondary | ICD-10-CM

## 2017-01-17 DIAGNOSIS — E039 Hypothyroidism, unspecified: Secondary | ICD-10-CM | POA: Diagnosis present

## 2017-01-17 DIAGNOSIS — R42 Dizziness and giddiness: Secondary | ICD-10-CM

## 2017-01-17 DIAGNOSIS — G43A0 Cyclical vomiting, not intractable: Secondary | ICD-10-CM

## 2017-01-17 LAB — ECHOCARDIOGRAM COMPLETE
AOASC: 30 cm
CHL CUP MV DEC (S): 141
CHL CUP RV SYS PRESS: 25 mmHg
E decel time: 141 msec
E/e' ratio: 8.07
FS: 22 % — AB (ref 28–44)
Height: 65 in
IV/PV OW: 1.01
LA ID, A-P, ES: 38 mm
LA diam end sys: 38 mm
LA vol A4C: 47.1 ml
LADIAMINDEX: 1.75 cm/m2
LAVOL: 40.4 mL
LAVOLIN: 18.6 mL/m2
LV E/e' medial: 8.07
LV E/e'average: 8.07
LV PW d: 10 mm — AB (ref 0.6–1.1)
LV TDI E'LATERAL: 10.7
LV e' LATERAL: 10.7 cm/s
LVOT VTI: 23.1 cm
LVOT area: 2.84 cm2
LVOT peak vel: 109 cm/s
LVOTD: 19 mm
LVOTSV: 66 mL
Lateral S' vel: 13.1 cm/s
MV Peak grad: 3 mmHg
MV pk E vel: 86.3 m/s
MVPKAVEL: 99 m/s
Reg peak vel: 236 cm/s
TAPSE: 31.3 mm
TDI e' medial: 9.68
TRMAXVEL: 236 cm/s
WEIGHTICAEL: 3483.2 [oz_av]

## 2017-01-17 LAB — COMPREHENSIVE METABOLIC PANEL
ALBUMIN: 3.2 g/dL — AB (ref 3.5–5.0)
ALT: 44 U/L (ref 14–54)
AST: 65 U/L — AB (ref 15–41)
Alkaline Phosphatase: 99 U/L (ref 38–126)
Anion gap: 6 (ref 5–15)
BILIRUBIN TOTAL: 0.6 mg/dL (ref 0.3–1.2)
BUN: 6 mg/dL (ref 6–20)
CHLORIDE: 109 mmol/L (ref 101–111)
CO2: 27 mmol/L (ref 22–32)
Calcium: 8.8 mg/dL — ABNORMAL LOW (ref 8.9–10.3)
Creatinine, Ser: 0.74 mg/dL (ref 0.44–1.00)
GFR calc Af Amer: >60 mL/min (ref >60)
GFR calc non Af Amer: >60 mL/min (ref >60)
GLUCOSE: 84 mg/dL (ref 65–99)
POTASSIUM: 4.2 mmol/L (ref 3.5–5.1)
Sodium: 142 mmol/L (ref 135–145)
TOTAL PROTEIN: 5.8 g/dL — AB (ref 6.5–8.1)

## 2017-01-17 LAB — FOLATE: Folate: 15.2 ng/mL (ref >5.9)

## 2017-01-17 LAB — GLUCOSE, CAPILLARY
GLUCOSE-CAPILLARY: 98 mg/dL (ref 65–99)
Glucose-Capillary: 102 mg/dL — ABNORMAL HIGH (ref 65–99)

## 2017-01-17 LAB — CBC
HEMATOCRIT: 39.4 % (ref 36.0–46.0)
Hemoglobin: 12.3 g/dL (ref 12.0–15.0)
MCH: 31.5 pg (ref 26.0–34.0)
MCHC: 31.2 g/dL (ref 30.0–36.0)
MCV: 100.8 fL — AB (ref 78.0–100.0)
Platelets: 244 10*3/uL (ref 150–400)
RBC: 3.91 MIL/uL (ref 3.87–5.11)
RDW: 15.4 % (ref 11.5–15.5)
WBC: 7.3 10*3/uL (ref 4.0–10.5)

## 2017-01-17 LAB — VITAMIN B12: Vitamin B-12: 567 pg/mL (ref 180–914)

## 2017-01-17 LAB — HEMOGLOBIN A1C
Hgb A1c MFr Bld: 5.8 % — ABNORMAL HIGH (ref 4.8–5.6)
Mean Plasma Glucose: 120 mg/dL

## 2017-01-17 LAB — TROPONIN I
Troponin I: <0.03 ng/mL (ref <0.03)
Troponin I: <0.03 ng/mL (ref <0.03)

## 2017-01-17 MED ORDER — MECLIZINE HCL 25 MG PO TABS
25.0000 mg | ORAL_TABLET | Freq: Three times a day (TID) | ORAL | Status: DC
  Administered 2017-01-17 – 2017-01-18 (×4): 25 mg via ORAL
  Filled 2017-01-17 (×4): qty 1

## 2017-01-17 MED ORDER — PERFLUTREN LIPID MICROSPHERE
1.0000 mL | INTRAVENOUS | Status: AC | PRN
  Filled 2017-01-17: qty 10

## 2017-01-17 NOTE — Care Management Note (Signed)
Case Management Note  Patient Details  Name: Emma Lewis MRN: [id number] Date of Birth: 24-Aug-1950  Subjective/Objective:      Acute vertigo              Action/Plan: Discharge Planning: NCM spoke to pt and husband at bedside. Pt states she had [facility] in [place] in the past. # [phone] fax [phone]. Faxed orders with facesheet to [facility]. Will fax dc summary when available. Pt has RW and bedside commode at home.   PCP LAM, LYNN E    Expected Discharge Date:               Expected Discharge Plan:  [place]  In-House Referral:  NA  Discharge planning Services  CM Consult  Post Acute Care Choice:  Home Health Choice offered to:  Patient, Spouse  DME Arranged:  N/A DME Agency:  NA  HH Arranged:  PT, OT HH Agency:  [place]  Status of Service:  Completed, signed off  If discussed at [place] of Stay Meetings, dates discussed:    Additional Comments:  Erenest Rasher, RN 01/17/2017, 5:39 PM

## 2017-01-17 NOTE — Progress Notes (Signed)
[facility]                                                                              Patient Demographics  Emma Lewis, is a 66 y.o. female, DOB - 05-Aug-1950, MGQ:[id number]  Admit date - 01/16/2017   Admitting Physician Elwin Mocha, MD  Outpatient Primary MD for the patient is Philmore Pali, NP  Outpatient specialists:   LOS - 1  days   Medical records reviewed and are as summarized below:    No chief complaint on file.      Brief summary   Patient is a 66 year old female with HTN, HLD, CKD, Chronic bronchitis, "fever blisters" on chronic Acyclovir, Hypothyroidism, s/p cervical disc surgery 2015, pelvic mass s/p salpingohorectomy, and recent right carpal tunnel surgery last Friday with post op n/v , history of vertigo, history of migraines, brought from [facility] presented with severe vertigo, dizziness, nausea and vomiting for last 1 day. MRI negative for acute stroke.   Assessment & Plan    Principal Problem:  Acute Vertigo - MRI negative for acute CVA. Has horizontal nystagmus, history of tinnitus - Continue gentle hydration, B12, folate within normal limits - PT for vestibular evaluation - Placed on scheduled meclizine, Zofran IV as needed - Nausea and vomiting improved, hence diet advanced - Outpatient ENT evaluation -2-D echo Showed EF of 55-60%, no regional wall motion abnormalities   Active Problems:   Nausea and vomiting - Improved, hence diet advanced, gentle hydration    Essential hypertension -Currently stable, not on home meds, continue hydralazine as needed      GERD (gastroesophageal reflux disease) - Takes Aleve for arthritis pain, stress the need for PPI, takes omeprazole at home    Hypothyroidism - Continue Synthroid, TSH 2.2   Code Status: Full CODE STATUS DVT Prophylaxis:   SCD's Family Communication: Discussed in detail with the patient, all imaging results, lab results explained to the patient    Disposition  Plan: Hopefully in a.m. if improving   Time Spent in minutes   35 minutes  Procedures:  MRI brain   Consultants:   None   Antimicrobials:      Medications  Scheduled Meds: . acyclovir  400 mg Oral Daily  . levothyroxine  75 mcg Oral QAC breakfast  . meclizine  25 mg Oral TID  . pantoprazole  40 mg Oral Daily  . simvastatin  40 mg Oral Daily  . sodium chloride flush  3 mL Intravenous Q12H   Continuous Infusions: . sodium chloride 75 mL/hr at 01/17/17 0815   PRN Meds:.acetaminophen **OR** acetaminophen, bisacodyl, hydrALAZINE, ondansetron **OR** ondansetron (ZOFRAN) IV, perflutren lipid microspheres (DEFINITY) IV suspension, prochlorperazine, senna-docusate   Antibiotics   Anti-infectives    Start     Dose/Rate Route Frequency Ordered Stop   01/16/17 2000  acyclovir (ZOVIRAX) tablet 400 mg     400 mg Oral Daily 01/16/17 1624          Subjective:   Emma Lewis was seen and examined today. Still feels room spinning feeling, nausea is improving. No vomiting.  Patient denies dizziness, chest pain, shortness of breath, abdominal pain,  new weakness, numbess, tingling. No acute events overnight.    Objective:   Vitals:   01/16/17 2027 01/17/17 0102 01/17/17 0534 01/17/17 0851  BP: 138/89 133/79 140/76 106/66  Pulse: 90 88 80 76  Resp: 20 20 18 18   Temp: 97.8 F (36.6 C) 98 F (36.7 C) 98.4 F (36.9 C) 97.7 F (36.5 C)  TempSrc: Oral Oral Oral Oral  SpO2: 99% 98% 99% 97%  Weight:      Height:        Intake/Output Summary (Last 24 hours) at 01/17/17 1112 Last data filed at 01/17/17 0330  Gross per 24 hour  Intake          1305.42 ml  Output             1150 ml  Net           155.42 ml     Wt Readings from Last 3 Encounters:  01/16/17 98.7 kg (217 lb 11.2 oz)  09/27/16 90.7 kg (200 lb)  06/10/16 89.6 kg (197 lb 9 oz)     Exam  General: Alert and oriented x 3, NAD  Eyes: PERRLA, EOMI, Anicteric Sclera,  HEENT:  Atraumatic, normocephalic, normal  oropharynx  Cardiovascular: S1 S2 auscultated, no rubs, murmurs or gallops. Regular rate and rhythm.  Respiratory: Clear to auscultation bilaterally, no wheezing, rales or rhonchi  Gastrointestinal: Soft, nontender, nondistended, + bowel sounds  Ext: no pedal edema bilaterally  Neuro: AAOx3, Cr N's II- XII. Strength 5/5 upper and lower extremities bilaterally, speech clear, sensations grossly intact, + horizontal nystagmus   Musculoskeletal: No digital cyanosis, clubbing  Skin: No rashes  Psych: Normal affect and demeanor, alert and oriented x3    Data Reviewed:  I have personally reviewed following labs and imaging studies  Micro Results No results found for this or any previous visit (from the past 240 hour(s)).  Radiology Reports Mr Brain Wo Contrast  Result Date: 01/16/2017 CLINICAL DATA:  Initial evaluation for acute vertigo. EXAM: MRI HEAD WITHOUT CONTRAST TECHNIQUE: Multiplanar, multiecho pulse sequences of the brain and surrounding structures were obtained without intravenous contrast. COMPARISON:  Prior CT from earlier same day. FINDINGS: Brain: Cerebral volume within normal limits for age. Patchy T2/FLAIR hyperintensity present within the periventricular and deep white matter both cerebral hemispheres, nonspecific, but most likely related to chronic small vessel ischemic disease, moderate nature. The the no abnormal foci of restricted diffusion to suggest acute or subacute ischemia. Slezak-white matter differentiation maintained. No encephalomalacia to suggest chronic infarction. No evidence for acute or chronic intracranial hemorrhage. No mass lesion, midline shift or mass effect. No hydrocephalus. No extra-axial fluid collection. Major dural sinuses are grossly patent. Pituitary gland suprasellar region within normal limits. Midline structures intact and normal. Vascular: Major intracranial vascular flow voids are maintained. Skull and upper cervical spine: Craniocervical  junction within normal limits. Postsurgical changes noted within the visualized cervical spine. Bone marrow signal intensity within normal limits. No scalp soft tissue abnormality. Sinuses/Orbits: Globes and oval soft tissues within normal limits. Patient status post lens extraction bilaterally. Paranasal sinuses largely clear. Trace left mastoid effusion, of doubtful significance. Inner ear structures normal. IMPRESSION: 1. No acute intracranial process. 2. Moderate patchy T2/FLAIR hyperintensity involving the supratentorial cerebral white matter, nonspecific, but most like related chronic small vessel ischemic disease. Electronically Signed   By: Jeannine Boga M.D.   On: 01/16/2017 23:00    Lab Data:  CBC:  Recent Labs Lab 01/16/17 1658 01/17/17 0519  WBC 6.6 7.3  NEUTROABS 5.4  --   HGB 12.3 12.3  HCT 37.9 39.4  MCV 97.9 100.8*  PLT 214 210   Basic Metabolic Panel:  Recent Labs Lab 01/16/17 1658 01/17/17 0519  NA 141 142  K 3.8 4.2  CL 109 109  CO2 26 27  GLUCOSE 117* 84  BUN 7 6  CREATININE 0.57 0.74  CALCIUM 8.5* 8.8*  MG 1.8  --   PHOS 3.5  --    GFR: Estimated Creatinine Clearance: 79.4 mL/min (by C-G formula based on SCr of 0.74 mg/dL). Liver Function Tests:  Recent Labs Lab 01/16/17 1658 01/17/17 0519  AST 80* 65*  ALT 51 44  ALKPHOS 111 99  BILITOT 0.6 0.6  PROT 5.9* 5.8*  ALBUMIN 3.3* 3.2*   No results for input(s): LIPASE, AMYLASE in the last 168 hours. No results for input(s): AMMONIA in the last 168 hours. Coagulation Profile: No results for input(s): INR, PROTIME in the last 168 hours. Cardiac Enzymes:  Recent Labs Lab 01/16/17 1658 01/16/17 2358 01/17/17 0519  TROPONINI <0.03 <0.03 <0.03   BNP (last 3 results) No results for input(s): PROBNP in the last 8760 hours. HbA1C:  Recent Labs  01/16/17 1658  HGBA1C 5.8*   CBG:  Recent Labs Lab 01/16/17 1812 01/16/17 2107  GLUCAP 111* 94   Lipid Profile: No results for  input(s): CHOL, HDL, LDLCALC, TRIG, CHOLHDL, LDLDIRECT in the last 72 hours. Thyroid Function Tests:  Recent Labs  01/16/17 1658  TSH 2.260   Anemia Panel:  Recent Labs  01/17/17 0815  VITAMINB12 567  FOLATE 15.2   Urine analysis:    Component Value Date/Time   COLORURINE YELLOW 01/16/2017 1827   APPEARANCEUR CLEAR 01/16/2017 1827   LABSPEC 1.013 01/16/2017 1827   PHURINE 7.0 01/16/2017 1827   GLUCOSEU NEGATIVE 01/16/2017 1827   HGBUR NEGATIVE 01/16/2017 1827   BILIRUBINUR NEGATIVE 01/16/2017 1827   KETONESUR NEGATIVE 01/16/2017 1827   PROTEINUR NEGATIVE 01/16/2017 1827   UROBILINOGEN 0.2 02/02/2014 1055   NITRITE NEGATIVE 01/16/2017 1827   LEUKOCYTESUR NEGATIVE 01/16/2017 1827     Ripudeep Rai M.D. [facility] 01/17/2017, 11:12 AM  Pager: [phone] Between 7am to 7pm - call Pager - [phone]  After 7pm go to [email or web address] - password [email or web address]  Call night coverage person covering after 7pm

## 2017-01-17 NOTE — Evaluation (Signed)
Physical Therapy Evaluation Patient Details Name: Emma Lewis MRN: [id number] DOB: 08-Aug-1950 Today's Date: 01/17/2017   History of Present Illness  Patient is a 66 year old female with HTN, HLD, CKD, Chronic bronchitis, "fever blisters" on chronic Acyclovir, Hypothyroidism, s/p cervical disc surgery 2015, pelvic mass s/p salpingohorectomy, and recent right carpal tunnel surgery last Friday with post op n/v , history of vertigo, history of migraines, brought from [facility] presented with severe vertigo, dizziness, nausea and vomiting for last 1 day. MRI negative for acute stroke.  Clinical Impression  Pt admitted with above diagnosis. Pt currently with functional limitations due to the deficits listed below (see PT Problem List). Pt positive for  left posterior canal cupulolithiasis.  Treated with Semont maneuver followed by Epley maneuver for left posterior canal BPPV.  Will follow up in pm and see if pt symptoms have been lessened by treatment.   Pt will benefit from skilled PT to increase their independence and safety with mobility to allow discharge to the venue listed below.      Follow Up Recommendations Home health PT;Supervision/Assistance - 24 hour;Supervision - Intermittent (Vestibular rehab)    Equipment Recommendations  Other (comment) (TBA)    Recommendations for Other Services       Precautions / Restrictions Precautions Precautions: Fall Restrictions Other Position/Activity Restrictions: recent right carpal tunnel surgery      Mobility  Bed Mobility Overal bed mobility: Needs Assistance Bed Mobility: Supine to Sit     Supine to sit: Min assist     General bed mobility comments: needed assist on ly due to dizziness  Transfers Overall transfer level: Needs assistance Equipment used: 2 person hand held assist Transfers: Sit to/from Stand;Stand Pivot Transfers Sit to Stand: Min assist;+2 physical assistance Stand pivot transfers: Min assist;+2 physical  assistance       General transfer comment: min assist due to dizziness.  Ambulation/Gait                Stairs            Wheelchair Mobility    Modified Rankin (Stroke Patients Only)       Balance Overall balance assessment: Needs assistance Sitting-balance support: No upper extremity supported;Feet supported Sitting balance-Leahy Scale: Good   Postural control: Right lateral lean Standing balance support: Bilateral upper extremity supported;During functional activity Standing balance-Leahy Scale: Poor Standing balance comment: relies on UEs upport.                              Pertinent Vitals/Pain Pain Assessment: No/denies pain    Home Living Family/patient expects to be discharged to:: Private residence Living Arrangements: Spouse/significant other Available Help at Discharge: Family;Available PRN/intermittently Type of Home: House Home Access: Stairs to enter Entrance Stairs-Rails: Psychiatric nurse of Steps: 4 Home Layout: One level Home Equipment: Walker - 2 wheels;Crutches;Bedside commode;Cane - single point      Prior Function Level of Independence: Independent               Hand Dominance   Dominant Hand: Right    Extremity/Trunk Assessment   Upper Extremity Assessment Upper Extremity Assessment: Defer to OT evaluation    Lower Extremity Assessment Lower Extremity Assessment: Overall WFL for tasks assessed    Cervical / Trunk Assessment Cervical / Trunk Assessment: Normal  Communication   Communication: No difficulties  Cognition Arousal/Alertness: Awake/alert Behavior During Therapy: Anxious Overall Cognitive Status: Within Functional Limits for tasks assessed  General Comments General comments (skin integrity, edema, etc.): Noted left rotary upbeating nystagmus at rest.  Suspect left posterior canal cupulolithiasis.  Treated with  Semont maneuver followed by Epley maneuver for left posterior canal BPPV.      Exercises     Assessment/Plan    PT Assessment Patient needs continued PT services  PT Problem List Decreased activity tolerance;Decreased balance;Decreased mobility;Decreased knowledge of use of DME;Decreased safety awareness;Decreased knowledge of precautions (dizziness)       PT Treatment Interventions DME instruction;Gait training;Functional mobility training;Stair training;Therapeutic activities;Therapeutic exercise;Balance training;Patient/family education (canalith repositioning maneuver, Semont)    PT Goals (Current goals can be found in the Care Plan section)  Acute Rehab PT Goals Patient Stated Goal: to get ridd of dizziness PT Goal Formulation: With patient Time For Goal Achievement: 01/31/17 Potential to Achieve Goals: Good    Frequency Min 5X/week   Barriers to discharge        Co-evaluation               AM-PAC PT "6 Clicks" Daily Activity  Outcome Measure Difficulty turning over in bed (including adjusting bedclothes, sheets and blankets)?: A Little Difficulty moving from lying on back to sitting on the side of the bed? : A Little Difficulty sitting down on and standing up from a chair with arms (e.g., wheelchair, bedside commode, etc,.)?: A Little Help needed moving to and from a bed to chair (including a wheelchair)?: A Little Help needed walking in hospital room?: A Lot Help needed climbing 3-5 steps with a railing? : A Lot 6 Click Score: 16    End of Session Equipment Utilized During Treatment: Gait belt Activity Tolerance: Patient limited by fatigue (limited by dizziness) Patient left: in chair;with call bell/phone within reach;with chair alarm set Nurse Communication: Mobility status PT Visit Diagnosis: Unsteadiness on feet (R26.81);BPPV;Dizziness and giddiness (R42) BPPV - Right/Left : Left    Time: 5929-2446 PT Time Calculation (min) (ACUTE ONLY): 38  min   Charges:   PT Evaluation $PT Eval Moderate Complexity: 1 Procedure PT Treatments $Therapeutic Activity: 8-22 mins $Canalith Rep Proc: 8-22 mins   PT G Codes:        Kymere Fullington,PT Acute Rehabilitation [phone] (pager)   Denice Paradise 01/17/2017, 2:53 PM

## 2017-01-17 NOTE — Progress Notes (Addendum)
01/17/17 1617  PT Visit Information  Last PT Received On 01/17/17  Assistance Needed +1  History of Present Illness Patient is a 66 year old female with HTN, HLD, CKD, Chronic bronchitis, "fever blisters" on chronic Acyclovir, Hypothyroidism, s/p cervical disc surgery 2015, pelvic mass s/p salpingohorectomy, and recent right carpal tunnel surgery last Friday with post op n/v , history of vertigo, history of migraines, brought from [facility] presented with severe vertigo, dizziness, nausea and vomiting for last 1 day. MRI negative for acute stroke.  Precautions  Precautions Fall  Restrictions  Weight Bearing Restrictions No  Other Position/Activity Restrictions recent right carpal tunnel surgery  Pain Assessment  Pain Assessment No/denies pain  Cognition  Arousal/Alertness Awake/alert  Behavior During Therapy Anxious  Overall Cognitive Status Within Functional Limits for tasks assessed  Bed Mobility  Overal bed mobility Needs Assistance  Bed Mobility Supine to Sit  Supine to sit Min assist  General bed mobility comments needed assist only due to dizziness  Transfers  Overall transfer level Needs assistance  Equipment used 2 person hand held assist  Transfers Sit to/from Stand;Stand Pivot Transfers  Sit to Stand Min assist;+2 physical assistance  Stand pivot transfers Min assist;+2 physical assistance  General transfer comment min assist due to dizziness.  Balance  Overall balance assessment Needs assistance  Sitting-balance support No upper extremity supported;Feet supported  Sitting balance-Leahy Scale Good  Postural control Right lateral lean  Standing balance support Bilateral upper extremity supported;During functional activity  Standing balance-Leahy Scale Poor  Standing balance comment relies on UE support.   General Comments  General comments (skin integrity, edema, etc.) Noted left rotary upbeating nystagmus continued at rest but was less intense than this am  with pt stating 4/10. Suspect left posterior canal cupulolithiasis continues. Treated with Semont maneuver followed by Epley maneuver for left posterior canal BPPV.  Pt states at end of treatment 2/10 dizziness.  Husband present and understands how to assist pt and understands BPPV.  Exercises  Exercises Other exercises  Other Exercises  Other Exercises reviewed Nestor Lewandowsky and instructed pt to perform exercise at least 5 reps each direction this evening in hopes that crystals can continue to be dislodged.     PT - End of Session  Equipment Utilized During Treatment Gait belt  Activity Tolerance Patient limited by fatigue (limited by dizziness)  Patient left with call bell/phone within reach;in bed;with family/visitor present  Nurse Communication Mobility status  PT - Assessment/Plan  PT Plan Current plan remains appropriate  PT Visit Diagnosis Unsteadiness on feet (R26.81);BPPV;Dizziness and giddiness (R42)  BPPV - Right/Left  Left  PT Frequency (ACUTE ONLY) Min 5X/week  Follow Up Recommendations Home health PT;Supervision - Intermittent (Vestibular rehab) [facility] care  PT equipment None recommended by PT  AM-PAC PT "6 Clicks" Daily Activity Outcome Measure  Difficulty turning over in bed (including adjusting bedclothes, sheets and blankets)? 3  Difficulty moving from lying on back to sitting on the side of the bed?  3  Difficulty sitting down on and standing up from a chair with arms (e.g., wheelchair, bedside commode, etc,.)? 3  Help needed moving to and from a bed to chair (including a wheelchair)? 3  Help needed walking in hospital room? 2  Help needed climbing 3-5 steps with a railing?  2  6 Click Score 16  Mobility G Code  CK  PT Goal Progression  Progress towards PT goals Progressing toward goals  PT Time Calculation  PT Start Time (ACUTE ONLY)  1502  PT Stop Time (ACUTE ONLY) 1542  PT Time Calculation (min) (ACUTE ONLY) 40 min  PT General Charges  $$ ACUTE PT  VISIT 1 Procedure  PT Treatments  $Therapeutic Exercise 8-22 mins  $Therapeutic Activity 23-37 mins  Pt with dizziness lessened this pm.  Eyes still left upbeating rotary nystagmus at rest however pt reports that dizziness 4/10 initially and after this 2nd treatment this pm, pt feels like she is at 2/10.  Will continue PT as pt tolerates.  Thanks.  [place] [phone] (pager)

## 2017-01-17 NOTE — Progress Notes (Signed)
  Echocardiogram 2D Echocardiogram has been performed.  Emma Lewis 01/17/2017, 9:57 AM

## 2017-01-18 LAB — CBC
HCT: 38.1 % (ref 36.0–46.0)
HEMOGLOBIN: 12.1 g/dL (ref 12.0–15.0)
MCH: 31.6 pg (ref 26.0–34.0)
MCHC: 31.8 g/dL (ref 30.0–36.0)
MCV: 99.5 fL (ref 78.0–100.0)
Platelets: 216 10*3/uL (ref 150–400)
RBC: 3.83 MIL/uL — AB (ref 3.87–5.11)
RDW: 15.6 % — ABNORMAL HIGH (ref 11.5–15.5)
WBC: 5.8 10*3/uL (ref 4.0–10.5)

## 2017-01-18 LAB — BASIC METABOLIC PANEL
Anion gap: 8 (ref 5–15)
BUN: 8 mg/dL (ref 6–20)
CHLORIDE: 109 mmol/L (ref 101–111)
CO2: 26 mmol/L (ref 22–32)
CREATININE: 0.8 mg/dL (ref 0.44–1.00)
Calcium: 8.8 mg/dL — ABNORMAL LOW (ref 8.9–10.3)
GFR calc Af Amer: >60 mL/min (ref >60)
GFR calc non Af Amer: >60 mL/min (ref >60)
GLUCOSE: 94 mg/dL (ref 65–99)
POTASSIUM: 4 mmol/L (ref 3.5–5.1)
Sodium: 143 mmol/L (ref 135–145)

## 2017-01-18 LAB — GLUCOSE, CAPILLARY: Glucose-Capillary: 115 mg/dL — ABNORMAL HIGH (ref 65–99)

## 2017-01-18 MED ORDER — MECLIZINE HCL 25 MG PO TABS: 25.0000 mg | ORAL_TABLET | Freq: Three times a day (TID) | ORAL | 3 refills | Status: DC | PRN

## 2017-01-18 MED ORDER — ONDANSETRON 4 MG PO TBDP: 4.0000 mg | ORAL_TABLET | Freq: Three times a day (TID) | ORAL | 0 refills | Status: DC | PRN

## 2017-01-18 NOTE — Discharge Summary (Signed)
Physician Discharge Summary   Patient ID: Emma Lewis MRN: [id number] DOB/AGE: 68-Feb-1952 68 y.o.  Admit date: 01/16/2017 Discharge date: 01/18/2017  Primary Care Physician:  Philmore Pali, NP  Discharge Diagnoses:         Benign paroxysmal positional vertigo . Nausea and vomiting . GERD (gastroesophageal reflux disease) . Hypothyroidism   Consults: None  Recommendations for Outpatient Follow-up:  1. Please repeat CBC/BMET at next visit 2. Home health PT for vestibular rehabilitation   DIET: Heart healthy diet    Allergies:   Allergies  Allergen Reactions  . Sulfa Antibiotics Other (See Comments)    Mother said "I liked to have died"  . Simvastatin Other (See Comments)    Fatty liver disease  . Fenofibrate Rash    She didn't feel right     DISCHARGE MEDICATIONS: Current Discharge Medication List    START taking these medications   Details  meclizine (ANTIVERT) 25 MG tablet Take 1 tablet (25 mg total) by mouth 3 (three) times daily as needed for dizziness. Vertigo. Also available OTC Qty: 90 tablet, Refills: 3    ondansetron (ZOFRAN ODT) 4 MG disintegrating tablet Take 1 tablet (4 mg total) by mouth every 8 (eight) hours as needed for nausea or vomiting. Qty: 20 tablet, Refills: 0      CONTINUE these medications which have NOT CHANGED   Details  acyclovir (ZOVIRAX) 400 MG tablet Take 400 mg by mouth as directed. Take 1 tablet daily unless flare up then takes twice daily for 3 days    alendronate (FOSAMAX) 70 MG tablet Take 70 mg by mouth every Sunday. Refills: 1    Artificial Tear Solution (SOOTHE XP) SOLN Place 1 drop into both eyes daily as needed (dry eyes).    Calcium Carb-Cholecalciferol (CALCIUM 600 + D PO) Take 1 tablet by mouth 3 (three) times daily.     cholecalciferol (VITAMIN D) 1000 units tablet Take 2,000 Units by mouth 3 (three) times daily.    Evening Primrose Oil 500 MG CAPS Take 1 capsule by mouth 3 (three) times daily.     HYDROcodone-acetaminophen (NORCO) 7.5-325 MG tablet Take 1-2 tablets by mouth every 4 (four) hours as needed (breakthrough pain). Qty: 60 tablet, Refills: 0    ibuprofen (ADVIL,MOTRIN) 200 MG tablet Take 800 mg by mouth 2 (two) times daily.     KRILL OIL PO Take 1 tablet by mouth daily.    levothyroxine (SYNTHROID, LEVOTHROID) 75 MCG tablet Take 75 mcg by mouth daily before breakfast.    lidocaine (ASPERCREME W/LIDOCAINE) 4 % cream Apply 1 application topically as needed.    omeprazole (PRILOSEC) 20 MG capsule Take 10 mg by mouth daily.     simvastatin (ZOCOR) 40 MG tablet Take 40 mg by mouth daily.    Specialty Vitamins Products (BIOTIN PLUS KERATIN) 10000-100 MCG-MG TABS Take 1 tablet by mouth daily.    TURMERIC CURCUMIN PO Take 1 capsule by mouth 2 (two) times daily.      STOP taking these medications     clindamycin (CLEOCIN) 2 % vaginal cream      docusate sodium (COLACE) 100 MG capsule          Brief H and P: For complete details please refer to admission H and P, but in brief Patient is a 66 year old female with HTN, HLD, CKD, Chronic bronchitis, "fever blisters" on chronic Acyclovir, Hypothyroidism, s/p cervical disc surgery 2015, pelvic mass s/p salpingohorectomy, and recent right carpal tunnel surgery last Friday with post  op n/v , history of vertigo, history of migraines, brought from [facility] presented with severe vertigo, dizziness, nausea and vomiting for last 1 day. MRI negative for acute stroke.   Hospital Course:   Acute Vertigo - MRI negative for acute CVA. Has horizontal nystagmus, history of tinnitus - Patient was placed on gentle hydration. B12, folate within normal limits - PT for vestibular evaluation done with significant improvement - Placed on scheduled meclizine, Zofran IV as needed - Nausea and vomiting improved, hence diet was advanced, patient now tolerating solid diet without any difficulty - Outpatient ENT evaluation recommended as  patient has history of tinnitus, rule out Mnire's disease -2-D echo Showed EF of 55-60%, no regional wall motion abnormalities - Home health PT for vestibular rehabilitation arranged by case management     Nausea and vomiting - Improved, patient is now tolerating regular diet without any difficulty    Essential hypertension -Currently stable, not on home meds, continue hydralazine as needed      GERD (gastroesophageal reflux disease) - Takes Aleve for arthritis pain, stressed the need for PPI, takes omeprazole at home    Hypothyroidism - Continue Synthroid, TSH 2.2   Day of Discharge BP 117/64 (BP Location: Left Arm)   Pulse 75   Temp 98.3 F (36.8 C) (Oral)   Resp 16   Ht 5\' 5"  (1.651 m)   Wt 98.7 kg (217 lb 11.2 oz)   SpO2 92%   BMI 36.23 kg/m   Physical Exam: General: Alert and awake oriented x3 not in any acute distress. HEENT: anicteric sclera, pupils reactive to light and accommodation CVS: S1-S2 clear no murmur rubs or gallops Chest: clear to auscultation bilaterally, no wheezing rales or rhonchi Abdomen: soft nontender, nondistended, normal bowel sounds Extremities: no cyanosis, clubbing or edema noted bilaterally Neuro: Cranial nerves II-XII intact, no focal neurological deficits   The results of significant diagnostics from this hospitalization (including imaging, microbiology, ancillary and laboratory) are listed below for reference.    LAB RESULTS: Basic Metabolic Panel:  Recent Labs Lab 01/16/17 1658 01/17/17 0519 01/18/17 0256  NA 141 142 143  K 3.8 4.2 4.0  CL 109 109 109  CO2 26 27 26   GLUCOSE 117* 84 94  BUN 7 6 8   CREATININE 0.57 0.74 0.80  CALCIUM 8.5* 8.8* 8.8*  MG 1.8  --   --   PHOS 3.5  --   --    Liver Function Tests:  Recent Labs Lab 01/16/17 1658 01/17/17 0519  AST 80* 65*  ALT 51 44  ALKPHOS 111 99  BILITOT 0.6 0.6  PROT 5.9* 5.8*  ALBUMIN 3.3* 3.2*   No results for input(s): LIPASE, AMYLASE in the last 168  hours. No results for input(s): AMMONIA in the last 168 hours. CBC:  Recent Labs Lab 01/16/17 1658 01/17/17 0519 01/18/17 0256  WBC 6.6 7.3 5.8  NEUTROABS 5.4  --   --   HGB 12.3 12.3 12.1  HCT 37.9 39.4 38.1  MCV 97.9 100.8* 99.5  PLT 214 244 216   Cardiac Enzymes:  Recent Labs Lab 01/16/17 2358 01/17/17 0519  TROPONINI <0.03 <0.03   BNP: Invalid input(s): POCBNP CBG:  Recent Labs Lab 01/17/17 1106 01/17/17 2059  GLUCAP 102* 98    Significant Diagnostic Studies:  Mr Brain Wo Contrast  Result Date: 01/16/2017 CLINICAL DATA:  Initial evaluation for acute vertigo. EXAM: MRI HEAD WITHOUT CONTRAST TECHNIQUE: Multiplanar, multiecho pulse sequences of the brain and surrounding structures were obtained without intravenous  contrast. COMPARISON:  Prior CT from earlier same day. FINDINGS: Brain: Cerebral volume within normal limits for age. Patchy T2/FLAIR hyperintensity present within the periventricular and deep white matter both cerebral hemispheres, nonspecific, but most likely related to chronic small vessel ischemic disease, moderate nature. The the no abnormal foci of restricted diffusion to suggest acute or subacute ischemia. Mcnutt-white matter differentiation maintained. No encephalomalacia to suggest chronic infarction. No evidence for acute or chronic intracranial hemorrhage. No mass lesion, midline shift or mass effect. No hydrocephalus. No extra-axial fluid collection. Major dural sinuses are grossly patent. Pituitary gland suprasellar region within normal limits. Midline structures intact and normal. Vascular: Major intracranial vascular flow voids are maintained. Skull and upper cervical spine: Craniocervical junction within normal limits. Postsurgical changes noted within the visualized cervical spine. Bone marrow signal intensity within normal limits. No scalp soft tissue abnormality. Sinuses/Orbits: Globes and oval soft tissues within normal limits. Patient status post  lens extraction bilaterally. Paranasal sinuses largely clear. Trace left mastoid effusion, of doubtful significance. Inner ear structures normal. IMPRESSION: 1. No acute intracranial process. 2. Moderate patchy T2/FLAIR hyperintensity involving the supratentorial cerebral white matter, nonspecific, but most like related chronic small vessel ischemic disease. Electronically Signed   By: Jeannine Boga M.D.   On: 01/16/2017 23:00    2D ECHO: Study Conclusions  - Left ventricle: The cavity size was normal. Systolic function was   normal. The estimated ejection fraction was in the range of 55%   to 60%. Wall motion was normal; there were no regional wall   motion abnormalities. Left ventricular diastolic function   parameters were normal. - Aortic valve: Transvalvular velocity was within the normal range.   There was no stenosis. There was no regurgitation. - Mitral valve: Transvalvular velocity was within the normal range.   There was no evidence for stenosis. There was trivial   regurgitation. - Right ventricle: The cavity size was normal. Wall thickness was   normal. Systolic function was normal. - Pulmonary arteries: Systolic pressure was within the normal   range. PA peak pressure: 25 mm Hg (S). - Pericardium, extracardiac: A trivial pericardial effusion was   identified.  Disposition and Follow-up: Discharge Instructions    Diet - low sodium heart healthy    Complete by:  As directed    Discharge instructions    Complete by:  As directed    Please continue meclizine (antivert) 3 times a day for next 3 days or until symptoms are resolved, then as needed   Increase activity slowly    Complete by:  As directed        DISPOSITION: [facility] up.   Why:  Home Health Physical Therapy and Occupational Therapy       Philmore Pali, NP. Schedule an appointment as soon as possible for a visit in 10 day(s).    Specialty:  Nurse Practitioner Why:  for hospital follow-up Contact information: [place] [address] [phone]            Time spent on Discharge: 54mins   Signed:   Estill Cotta M.D. [facility] 01/18/2017, 11:19 AM Pager: [phone]

## 2017-01-18 NOTE — Discharge Instructions (Signed)

## 2017-01-18 NOTE — Progress Notes (Signed)
Physical Therapy Treatment Patient Details Name: Emma Lewis MRN: [id number] DOB: [birth year] Today's Date: 01/18/2017    History of Present Illness Patient is a 66 year old female with HTN, HLD, CKD, Chronic bronchitis, "fever blisters" on chronic Acyclovir, Hypothyroidism, s/p cervical disc surgery 2015, pelvic mass s/p salpingohorectomy, and recent right carpal tunnel surgery last Friday with post op n/v , history of vertigo, history of migraines, brought from [facility] presented with severe vertigo, dizziness, nausea and vomiting for last 1 day. MRI negative for acute stroke.    PT Comments    Patient noting decreased dizziness today.  Rates at 1/10 at end of session.  Agree with Lt posterior canal cupulolithiasis.  Performed Semont maneuver followed by Epley canalith repositioning maneuver with decrease in nystagmus and dizziness.  Continued to have resting Lt rotary upbeating nystagmus that increased with Lt modified Hallpike.  Agree with need for f/u HHPT for Vestibular Rehab.    Patient was able to ambulate 200' with RW and min guard assist, with no loss of balance.  Feel patient safe to d/c home today with f/u HHPT.    Follow Up Recommendations  Home health PT;Supervision - Intermittent (Vestibular rehab, pt wants [facility])     Equipment Recommendations  None recommended by PT    Recommendations for Other Services       Precautions / Restrictions Precautions Precautions: Fall Restrictions Weight Bearing Restrictions: No Other Position/Activity Restrictions: recent right carpal tunnel surgery    Mobility  Bed Mobility Overal bed mobility: Needs Assistance Bed Mobility: Rolling;Sidelying to Sit;Sit to Sidelying Rolling: Modified independent (Device/Increase time) Sidelying to sit: Supervision     Sit to sidelying: Supervision General bed mobility comments: Supervision for safety only  Transfers Overall transfer level: Needs assistance Equipment used:  Rolling walker (2 wheeled) Transfers: Sit to/from Stand Sit to Stand: Min assist;Min guard         General transfer comment: Min assist to stand due to RUE limitations.  Min guard to return to sitting.  Slow transfers for safety.  Ambulation/Gait Ambulation/Gait assistance: Min guard Ambulation Distance (Feet): 200 Feet Assistive device: Rolling walker (2 wheeled) Gait Pattern/deviations: Step-through pattern;Decreased stride length Gait velocity: decreased Gait velocity interpretation: Below normal speed for age/gender General Gait Details: Patient with slow guarded gait.  Using visual targets to decrease dizziness.  Note leg length discrepency during gait.  No loss of balance during gait with RW.   Stairs            Wheelchair Mobility    Modified Rankin (Stroke Patients Only)       Balance Overall balance assessment: Needs assistance Sitting-balance support: No upper extremity supported;Feet supported Sitting balance-Leahy Scale: Good     Standing balance support: No upper extremity supported Standing balance-Leahy Scale: Fair Standing balance comment: Able to maintain static balance.  Requires UE support for balance during gait.                            Cognition Arousal/Alertness: Awake/alert Behavior During Therapy: Anxious Overall Cognitive Status: Within Functional Limits for tasks assessed                                        Exercises Other Exercises Other Exercises: Reviewed Laruth Bouchard Daroff maneuver.  Patient able to perform with verbal cues.  Encouraged her to use handout for guide  at home.    General Comments General comments (skin integrity, edema, etc.): Noted left rotary upbeating nystagmus continued at rest but was less intense than yesterday, with pt rating dizziness as 2/10.  Suspect left posterior canal cupulolithiasis continues. Treated with Semont maneuver followed by Epley maneuver for left posterior canal  BPPV.  Pt states at end of treatment 1/10 dizziness.        Pertinent Vitals/Pain Pain Assessment: No/denies pain    Home Living                      Prior Function            PT Goals (current goals can now be found in the care plan section) Acute Rehab PT Goals Patient Stated Goal: to get ridd of dizziness Progress towards PT goals: Progressing toward goals    Frequency    Min 5X/week      PT Plan Current plan remains appropriate    Co-evaluation              AM-PAC PT "6 Clicks" Daily Activity  Outcome Measure  Difficulty turning over in bed (including adjusting bedclothes, sheets and blankets)?: None Difficulty moving from lying on back to sitting on the side of the bed? : A Little Difficulty sitting down on and standing up from a chair with arms (e.g., wheelchair, bedside commode, etc,.)?: Total Help needed moving to and from a bed to chair (including a wheelchair)?: A Little Help needed walking in hospital room?: A Little Help needed climbing 3-5 steps with a railing? : A Little 6 Click Score: 17    End of Session Equipment Utilized During Treatment: Gait belt Activity Tolerance: Patient tolerated treatment well Patient left: in bed;with call bell/phone within reach;with family/visitor present (sitting EOB for lunch) Nurse Communication: Mobility status (ready for d/c from PT perspective) PT Visit Diagnosis: Unsteadiness on feet (R26.81);BPPV;Dizziness and giddiness (R42) BPPV - Right/Left : Left     Time: 7948-0165 PT Time Calculation (min) (ACUTE ONLY): 34 min  Charges:  $Gait Training: 8-22 mins $Canalith Rep Proc: 8-22 mins                    G Codes:       Carita Pian. Sanjuana Kava, [facility] Acute Rehab Services Pager [phone]    Despina Pole 01/18/2017, 12:35 PM

## 2017-01-18 NOTE — Progress Notes (Signed)
Patient given discharge instructions.  All questions and concerns addressed.  Patient taken out by wheelchair to private vehicle.

## 2017-01-20 DIAGNOSIS — Z48811 Encounter for surgical aftercare following surgery on the nervous system: Secondary | ICD-10-CM | POA: Diagnosis not present

## 2017-01-20 DIAGNOSIS — B001 Herpesviral vesicular dermatitis: Secondary | ICD-10-CM | POA: Diagnosis not present

## 2017-01-20 DIAGNOSIS — J42 Unspecified chronic bronchitis: Secondary | ICD-10-CM | POA: Diagnosis not present

## 2017-01-20 DIAGNOSIS — I129 Hypertensive chronic kidney disease with stage 1 through stage 4 chronic kidney disease, or unspecified chronic kidney disease: Secondary | ICD-10-CM | POA: Diagnosis not present

## 2017-01-20 DIAGNOSIS — H8110 Benign paroxysmal vertigo, unspecified ear: Secondary | ICD-10-CM | POA: Diagnosis not present

## 2017-01-20 DIAGNOSIS — N189 Chronic kidney disease, unspecified: Secondary | ICD-10-CM | POA: Diagnosis not present

## 2017-01-20 NOTE — Consult Note (Signed)
           [facility] CM Primary Care Navigator  01/20/2017  Emma Lewis [birth year] [id number]   Went to see patientat the bedsideto identify possible discharge needs but she was alreadydischarged.  Patient was discharged home over the weekend per staffreport with home health services.  Primary care provider's office called (Summer)to notify of patient's discharge and need for post hospital follow-up and transition of care. Reminded of patient's health issues needing follow-up as well - (for vestibular rehabilitation).  Made aware to refer patient to [facility] care management ifdeemed appropriatefor services.     For questions, please contact:  Dannielle Huh, BSN, RN- [facility] Primary Care Navigator  Telephone: [phone] [place]

## 2017-01-21 NOTE — Progress Notes (Signed)
[place] and pt has a start of care today for [facility]. Faxed dc summary to [facility].        Jonnie Finner RN CCM Case Mgmt phone [phone]

## 2017-01-24 DIAGNOSIS — G5601 Carpal tunnel syndrome, right upper limb: Secondary | ICD-10-CM | POA: Diagnosis not present

## 2017-01-28 DIAGNOSIS — I129 Hypertensive chronic kidney disease with stage 1 through stage 4 chronic kidney disease, or unspecified chronic kidney disease: Secondary | ICD-10-CM | POA: Diagnosis not present

## 2017-01-28 DIAGNOSIS — J42 Unspecified chronic bronchitis: Secondary | ICD-10-CM | POA: Diagnosis not present

## 2017-01-28 DIAGNOSIS — N189 Chronic kidney disease, unspecified: Secondary | ICD-10-CM | POA: Diagnosis not present

## 2017-01-28 DIAGNOSIS — H8110 Benign paroxysmal vertigo, unspecified ear: Secondary | ICD-10-CM | POA: Diagnosis not present

## 2017-01-28 DIAGNOSIS — B001 Herpesviral vesicular dermatitis: Secondary | ICD-10-CM | POA: Diagnosis not present

## 2017-01-28 DIAGNOSIS — Z48811 Encounter for surgical aftercare following surgery on the nervous system: Secondary | ICD-10-CM | POA: Diagnosis not present

## 2017-01-31 DIAGNOSIS — E669 Obesity, unspecified: Secondary | ICD-10-CM | POA: Diagnosis not present

## 2017-01-31 DIAGNOSIS — Z48811 Encounter for surgical aftercare following surgery on the nervous system: Secondary | ICD-10-CM | POA: Diagnosis not present

## 2017-01-31 DIAGNOSIS — Z09 Encounter for follow-up examination after completed treatment for conditions other than malignant neoplasm: Secondary | ICD-10-CM | POA: Diagnosis not present

## 2017-01-31 DIAGNOSIS — R112 Nausea with vomiting, unspecified: Secondary | ICD-10-CM | POA: Diagnosis not present

## 2017-01-31 DIAGNOSIS — H8110 Benign paroxysmal vertigo, unspecified ear: Secondary | ICD-10-CM | POA: Diagnosis not present

## 2017-01-31 DIAGNOSIS — I129 Hypertensive chronic kidney disease with stage 1 through stage 4 chronic kidney disease, or unspecified chronic kidney disease: Secondary | ICD-10-CM | POA: Diagnosis not present

## 2017-01-31 DIAGNOSIS — K760 Fatty (change of) liver, not elsewhere classified: Secondary | ICD-10-CM | POA: Diagnosis not present

## 2017-01-31 DIAGNOSIS — J42 Unspecified chronic bronchitis: Secondary | ICD-10-CM | POA: Diagnosis not present

## 2017-01-31 DIAGNOSIS — B001 Herpesviral vesicular dermatitis: Secondary | ICD-10-CM | POA: Diagnosis not present

## 2017-01-31 DIAGNOSIS — N189 Chronic kidney disease, unspecified: Secondary | ICD-10-CM | POA: Diagnosis not present

## 2017-01-31 DIAGNOSIS — M5416 Radiculopathy, lumbar region: Secondary | ICD-10-CM | POA: Diagnosis not present

## 2017-01-31 DIAGNOSIS — E039 Hypothyroidism, unspecified: Secondary | ICD-10-CM | POA: Diagnosis not present

## 2017-02-04 DIAGNOSIS — H8110 Benign paroxysmal vertigo, unspecified ear: Secondary | ICD-10-CM | POA: Diagnosis not present

## 2017-02-04 DIAGNOSIS — B001 Herpesviral vesicular dermatitis: Secondary | ICD-10-CM | POA: Diagnosis not present

## 2017-02-04 DIAGNOSIS — Z48811 Encounter for surgical aftercare following surgery on the nervous system: Secondary | ICD-10-CM | POA: Diagnosis not present

## 2017-02-04 DIAGNOSIS — I129 Hypertensive chronic kidney disease with stage 1 through stage 4 chronic kidney disease, or unspecified chronic kidney disease: Secondary | ICD-10-CM | POA: Diagnosis not present

## 2017-02-04 DIAGNOSIS — J42 Unspecified chronic bronchitis: Secondary | ICD-10-CM | POA: Diagnosis not present

## 2017-02-04 DIAGNOSIS — N189 Chronic kidney disease, unspecified: Secondary | ICD-10-CM | POA: Diagnosis not present

## 2017-02-06 DIAGNOSIS — Z48811 Encounter for surgical aftercare following surgery on the nervous system: Secondary | ICD-10-CM | POA: Diagnosis not present

## 2017-02-06 DIAGNOSIS — N189 Chronic kidney disease, unspecified: Secondary | ICD-10-CM | POA: Diagnosis not present

## 2017-02-06 DIAGNOSIS — I129 Hypertensive chronic kidney disease with stage 1 through stage 4 chronic kidney disease, or unspecified chronic kidney disease: Secondary | ICD-10-CM | POA: Diagnosis not present

## 2017-02-06 DIAGNOSIS — J42 Unspecified chronic bronchitis: Secondary | ICD-10-CM | POA: Diagnosis not present

## 2017-02-06 DIAGNOSIS — H8110 Benign paroxysmal vertigo, unspecified ear: Secondary | ICD-10-CM | POA: Diagnosis not present

## 2017-02-06 DIAGNOSIS — B001 Herpesviral vesicular dermatitis: Secondary | ICD-10-CM | POA: Diagnosis not present

## 2017-02-11 DIAGNOSIS — H8110 Benign paroxysmal vertigo, unspecified ear: Secondary | ICD-10-CM | POA: Diagnosis not present

## 2017-02-11 DIAGNOSIS — I129 Hypertensive chronic kidney disease with stage 1 through stage 4 chronic kidney disease, or unspecified chronic kidney disease: Secondary | ICD-10-CM | POA: Diagnosis not present

## 2017-02-11 DIAGNOSIS — B001 Herpesviral vesicular dermatitis: Secondary | ICD-10-CM | POA: Diagnosis not present

## 2017-02-11 DIAGNOSIS — Z48811 Encounter for surgical aftercare following surgery on the nervous system: Secondary | ICD-10-CM | POA: Diagnosis not present

## 2017-02-11 DIAGNOSIS — N189 Chronic kidney disease, unspecified: Secondary | ICD-10-CM | POA: Diagnosis not present

## 2017-02-11 DIAGNOSIS — J42 Unspecified chronic bronchitis: Secondary | ICD-10-CM | POA: Diagnosis not present

## 2017-02-14 DIAGNOSIS — H8110 Benign paroxysmal vertigo, unspecified ear: Secondary | ICD-10-CM | POA: Diagnosis not present

## 2017-02-14 DIAGNOSIS — Z48811 Encounter for surgical aftercare following surgery on the nervous system: Secondary | ICD-10-CM | POA: Diagnosis not present

## 2017-02-14 DIAGNOSIS — N189 Chronic kidney disease, unspecified: Secondary | ICD-10-CM | POA: Diagnosis not present

## 2017-02-14 DIAGNOSIS — I129 Hypertensive chronic kidney disease with stage 1 through stage 4 chronic kidney disease, or unspecified chronic kidney disease: Secondary | ICD-10-CM | POA: Diagnosis not present

## 2017-02-14 DIAGNOSIS — B001 Herpesviral vesicular dermatitis: Secondary | ICD-10-CM | POA: Diagnosis not present

## 2017-02-14 DIAGNOSIS — J42 Unspecified chronic bronchitis: Secondary | ICD-10-CM | POA: Diagnosis not present

## 2017-02-17 DIAGNOSIS — H8110 Benign paroxysmal vertigo, unspecified ear: Secondary | ICD-10-CM | POA: Diagnosis not present

## 2017-02-17 DIAGNOSIS — I129 Hypertensive chronic kidney disease with stage 1 through stage 4 chronic kidney disease, or unspecified chronic kidney disease: Secondary | ICD-10-CM | POA: Diagnosis not present

## 2017-02-17 DIAGNOSIS — J42 Unspecified chronic bronchitis: Secondary | ICD-10-CM | POA: Diagnosis not present

## 2017-02-17 DIAGNOSIS — B001 Herpesviral vesicular dermatitis: Secondary | ICD-10-CM | POA: Diagnosis not present

## 2017-02-17 DIAGNOSIS — N189 Chronic kidney disease, unspecified: Secondary | ICD-10-CM | POA: Diagnosis not present

## 2017-02-17 DIAGNOSIS — Z48811 Encounter for surgical aftercare following surgery on the nervous system: Secondary | ICD-10-CM | POA: Diagnosis not present

## 2017-02-18 DIAGNOSIS — R42 Dizziness and giddiness: Secondary | ICD-10-CM | POA: Diagnosis not present

## 2017-02-18 DIAGNOSIS — H9319 Tinnitus, unspecified ear: Secondary | ICD-10-CM | POA: Diagnosis not present

## 2017-03-12 DIAGNOSIS — M4727 Other spondylosis with radiculopathy, lumbosacral region: Secondary | ICD-10-CM | POA: Diagnosis not present

## 2017-03-20 DIAGNOSIS — M545 Low back pain: Secondary | ICD-10-CM | POA: Diagnosis not present

## 2017-03-20 DIAGNOSIS — M4727 Other spondylosis with radiculopathy, lumbosacral region: Secondary | ICD-10-CM | POA: Diagnosis not present

## 2017-03-20 DIAGNOSIS — M4696 Unspecified inflammatory spondylopathy, lumbar region: Secondary | ICD-10-CM | POA: Diagnosis not present

## 2017-04-07 DIAGNOSIS — M48062 Spinal stenosis, lumbar region with neurogenic claudication: Secondary | ICD-10-CM | POA: Diagnosis not present

## 2017-04-07 DIAGNOSIS — M4727 Other spondylosis with radiculopathy, lumbosacral region: Secondary | ICD-10-CM | POA: Diagnosis not present

## 2017-04-07 DIAGNOSIS — M4696 Unspecified inflammatory spondylopathy, lumbar region: Secondary | ICD-10-CM | POA: Diagnosis not present

## 2017-04-07 DIAGNOSIS — R030 Elevated blood-pressure reading, without diagnosis of hypertension: Secondary | ICD-10-CM | POA: Diagnosis not present

## 2017-04-07 DIAGNOSIS — Z6833 Body mass index (BMI) 33.0-33.9, adult: Secondary | ICD-10-CM | POA: Diagnosis not present

## 2017-04-09 DIAGNOSIS — M47817 Spondylosis without myelopathy or radiculopathy, lumbosacral region: Secondary | ICD-10-CM | POA: Diagnosis not present

## 2017-04-09 DIAGNOSIS — M5417 Radiculopathy, lumbosacral region: Secondary | ICD-10-CM | POA: Diagnosis not present

## 2017-04-09 DIAGNOSIS — Z6832 Body mass index (BMI) 32.0-32.9, adult: Secondary | ICD-10-CM | POA: Diagnosis not present

## 2017-04-09 DIAGNOSIS — R030 Elevated blood-pressure reading, without diagnosis of hypertension: Secondary | ICD-10-CM | POA: Diagnosis not present

## 2017-08-04 DIAGNOSIS — K760 Fatty (change of) liver, not elsewhere classified: Secondary | ICD-10-CM | POA: Diagnosis not present

## 2017-08-04 DIAGNOSIS — R7303 Prediabetes: Secondary | ICD-10-CM | POA: Diagnosis not present

## 2017-08-04 DIAGNOSIS — I10 Essential (primary) hypertension: Secondary | ICD-10-CM | POA: Diagnosis not present

## 2017-08-04 DIAGNOSIS — M545 Low back pain: Secondary | ICD-10-CM | POA: Diagnosis not present

## 2017-08-04 DIAGNOSIS — E039 Hypothyroidism, unspecified: Secondary | ICD-10-CM | POA: Diagnosis not present

## 2017-08-04 DIAGNOSIS — E669 Obesity, unspecified: Secondary | ICD-10-CM | POA: Diagnosis not present

## 2017-08-04 DIAGNOSIS — Z1231 Encounter for screening mammogram for malignant neoplasm of breast: Secondary | ICD-10-CM | POA: Diagnosis not present

## 2017-08-04 DIAGNOSIS — E662 Morbid (severe) obesity with alveolar hypoventilation: Secondary | ICD-10-CM | POA: Diagnosis not present

## 2017-08-04 DIAGNOSIS — Z2821 Immunization not carried out because of patient refusal: Secondary | ICD-10-CM | POA: Diagnosis not present

## 2017-08-04 DIAGNOSIS — Z23 Encounter for immunization: Secondary | ICD-10-CM | POA: Diagnosis not present

## 2017-08-04 DIAGNOSIS — Z6832 Body mass index (BMI) 32.0-32.9, adult: Secondary | ICD-10-CM | POA: Diagnosis not present

## 2017-08-04 DIAGNOSIS — E785 Hyperlipidemia, unspecified: Secondary | ICD-10-CM | POA: Diagnosis not present

## 2017-09-05 DIAGNOSIS — Z6833 Body mass index (BMI) 33.0-33.9, adult: Secondary | ICD-10-CM | POA: Diagnosis not present

## 2017-09-05 DIAGNOSIS — F329 Major depressive disorder, single episode, unspecified: Secondary | ICD-10-CM | POA: Diagnosis not present

## 2017-09-16 DIAGNOSIS — E039 Hypothyroidism, unspecified: Secondary | ICD-10-CM | POA: Diagnosis not present

## 2017-10-15 DIAGNOSIS — M5417 Radiculopathy, lumbosacral region: Secondary | ICD-10-CM | POA: Diagnosis not present

## 2017-10-15 DIAGNOSIS — M47817 Spondylosis without myelopathy or radiculopathy, lumbosacral region: Secondary | ICD-10-CM | POA: Diagnosis not present

## 2017-10-15 DIAGNOSIS — Z6832 Body mass index (BMI) 32.0-32.9, adult: Secondary | ICD-10-CM | POA: Diagnosis not present

## 2017-10-15 DIAGNOSIS — R030 Elevated blood-pressure reading, without diagnosis of hypertension: Secondary | ICD-10-CM | POA: Diagnosis not present

## 2017-11-03 DIAGNOSIS — Z1231 Encounter for screening mammogram for malignant neoplasm of breast: Secondary | ICD-10-CM | POA: Diagnosis not present

## 2017-11-04 ENCOUNTER — Ambulatory Visit (INDEPENDENT_AMBULATORY_CARE_PROVIDER_SITE_OTHER): Payer: Medicare Other | Admitting: Women's Health

## 2017-11-04 ENCOUNTER — Encounter: Payer: Self-pay | Admitting: Women's Health

## 2017-11-04 VITALS — BP 120/82 | Ht 65.0 in | Wt 197.6 lb

## 2017-11-04 DIAGNOSIS — Z9189 Other specified personal risk factors, not elsewhere classified: Secondary | ICD-10-CM | POA: Diagnosis not present

## 2017-11-04 DIAGNOSIS — Z01419 Encounter for gynecological examination (general) (routine) without abnormal findings: Secondary | ICD-10-CM | POA: Diagnosis not present

## 2017-11-04 DIAGNOSIS — Z8742 Personal history of other diseases of the female genital tract: Secondary | ICD-10-CM

## 2017-11-04 DIAGNOSIS — Z9889 Other specified postprocedural states: Secondary | ICD-10-CM

## 2017-11-04 MED ORDER — CLOBETASOL PROPIONATE 0.05 % EX CREA: 1.0000 "application " | TOPICAL_CREAM | Freq: Two times a day (BID) | CUTANEOUS | 1 refills | Status: DC

## 2017-11-04 NOTE — Progress Notes (Signed)
Emma Lewis 09-21-1950 [id number]    History:    Presents for breast and pelvic exam.  Postmenopausal on no HRT with no bleeding.  Normal Pap and mammogram history.  2017 BSO for benign teratoma.  2015- colonoscopy.  Vaccines current.  Is having hip problems had a hip revision surgery 2017 and has had problems with back pain, unequal gait since revision.  Osteoporosis on Fosamax primary care manages labs and bone density, last one done in [place].  Past medical history, past surgical history, family history and social history were all reviewed and documented in the EPIC chart.  Retired [employer].  ROS:  A ROS was performed and pertinent positives and negatives are included.  Exam:  Vitals:   11/04/17 1222  BP: 120/82  Weight: 197 lb 9.6 oz (89.6 kg)  Height: 5\' 5"  (1.651 m)   Body mass index is 32.88 kg/m.   General appearance:  Normal Thyroid:  Symmetrical, normal in size, without palpable masses or nodularity. Respiratory  Auscultation:  Clear without wheezing or rhonchi Cardiovascular  Auscultation:  Regular rate, without rubs, murmurs or gallops  Edema/varicosities:  Not grossly evident Abdominal  Soft,nontender, without masses, guarding or rebound.  Liver/spleen:  No organomegaly noted  Hernia:  None appreciated  Skin  Inspection:  Grossly normal   Breasts: Examined lying and sitting.     Right: Without masses, retractions, discharge or axillary adenopathy.     Left: Without masses, retractions, discharge or axillary adenopathy. Gentitourinary   Inguinal/mons:  Normal without inguinal adenopathy  External genitalia: Loss of pigment perineal area  BUS/Urethra/Skene's glands:  Normal  Vagina: Mild atrophy  Cervix:  Normal  Uterus: normal in size, shape and contour.  Midline and mobile  Adnexa/parametria:     Rt: Without masses or tenderness.   Lt: Without masses or tenderness.  Anus and perineum: Normal  Digital rectal exam: Normal sphincter tone without  palpated masses or tenderness  Assessment/Plan:  67 y.o. MWF G1 P1 for breast and pelvic exam with no GYN complaints.   Postmenopausal on no HRT with no bleeding 2017 BSO for teratoma Osteopenia primary care managing Hypothyroid, parathyroidism, hypercholesteremia arthritis,-primary care manages labs and meds Obesity Hip and back pain-orthopedist managing  Plan: Keep scheduled appointment with orthopedist.  SBE's, continue annual screening mammogram, calcium rich foods, vitamin D 2000 daily encouraged.  Home safety, fall prevention and importance of balance type exercise reviewed, chair yoga encouraged.  Temovate small amount of cream to perineal irritation will call if no relief.  Has used in the past.    Huel Cote [facility], 1:22 PM 11/04/2017

## 2017-11-04 NOTE — Patient Instructions (Signed)
Health Maintenance for Postmenopausal Women Menopause is a normal process in which your reproductive ability comes to an end. This process happens gradually over a span of months to years, usually between the ages of 22 and 9. Menopause is complete when you have missed 12 consecutive menstrual periods. It is important to talk with your health care provider about some of the most common conditions that affect postmenopausal women, such as heart disease, cancer, and bone loss (osteoporosis). Adopting a healthy lifestyle and getting preventive care can help to promote your health and wellness. Those actions can also lower your chances of developing some of these common conditions. What should I know about menopause? During menopause, you may experience a number of symptoms, such as:  Moderate-to-severe hot flashes.  Night sweats.  Decrease in sex drive.  Mood swings.  Headaches.  Tiredness.  Irritability.  Memory problems.  Insomnia.  Choosing to treat or not to treat menopausal changes is an individual decision that you make with your health care provider. What should I know about hormone replacement therapy and supplements? Hormone therapy products are effective for treating symptoms that are associated with menopause, such as hot flashes and night sweats. Hormone replacement carries certain risks, especially as you become older. If you are thinking about using estrogen or estrogen with progestin treatments, discuss the benefits and risks with your health care provider. What should I know about heart disease and stroke? Heart disease, heart attack, and stroke become more likely as you age. This may be due, in part, to the hormonal changes that your body experiences during menopause. These can affect how your body processes dietary fats, triglycerides, and cholesterol. Heart attack and stroke are both medical emergencies. There are many things that you can do to help prevent heart disease  and stroke:  Have your blood pressure checked at least every 1-2 years. High blood pressure causes heart disease and increases the risk of stroke.  If you are 53-22 years old, ask your health care provider if you should take aspirin to prevent a heart attack or a stroke.  Do not use any tobacco products, including cigarettes, chewing tobacco, or electronic cigarettes. If you need help quitting, ask your health care provider.  It is important to eat a healthy diet and maintain a healthy weight. ? Be sure to include plenty of vegetables, fruits, low-fat dairy products, and lean protein. ? Avoid eating foods that are high in solid fats, added sugars, or salt (sodium).  Get regular exercise. This is one of the most important things that you can do for your health. ? Try to exercise for at least 150 minutes each week. The type of exercise that you do should increase your heart rate and make you sweat. This is known as moderate-intensity exercise. ? Try to do strengthening exercises at least twice each week. Do these in addition to the moderate-intensity exercise.  Know your numbers.Ask your health care provider to check your cholesterol and your blood glucose. Continue to have your blood tested as directed by your health care provider.  What should I know about cancer screening? There are several types of cancer. Take the following steps to reduce your risk and to catch any cancer development as early as possible. Breast Cancer  Practice breast self-awareness. ? This means understanding how your breasts normally appear and feel. ? It also means doing regular breast self-exams. Let your health care provider know about any changes, no matter how small.  If you are 40  or older, have a clinician do a breast exam (clinical breast exam or CBE) every year. Depending on your age, family history, and medical history, it may be recommended that you also have a yearly breast X-ray (mammogram).  If you  have a family history of breast cancer, talk with your health care provider about genetic screening.  If you are at high risk for breast cancer, talk with your health care provider about having an MRI and a mammogram every year.  Breast cancer (BRCA) gene test is recommended for women who have family members with BRCA-related cancers. Results of the assessment will determine the need for genetic counseling and BRCA1 and for BRCA2 testing. BRCA-related cancers include these types: ? Breast. This occurs in males or females. ? Ovarian. ? Tubal. This may also be called fallopian tube cancer. ? Cancer of the abdominal or pelvic lining (peritoneal cancer). ? Prostate. ? Pancreatic.  Cervical, Uterine, and Ovarian Cancer Your health care provider may recommend that you be screened regularly for cancer of the pelvic organs. These include your ovaries, uterus, and vagina. This screening involves a pelvic exam, which includes checking for microscopic changes to the surface of your cervix (Pap test).  For women ages 21-65, health care providers may recommend a pelvic exam and a Pap test every three years. For women ages 79-65, they may recommend the Pap test and pelvic exam, combined with testing for human papilloma virus (HPV), every five years. Some types of HPV increase your risk of cervical cancer. Testing for HPV may also be done on women of any age who have unclear Pap test results.  Other health care providers may not recommend any screening for nonpregnant women who are considered low risk for pelvic cancer and have no symptoms. Ask your health care provider if a screening pelvic exam is right for you.  If you have had past treatment for cervical cancer or a condition that could lead to cancer, you need Pap tests and screening for cancer for at least 20 years after your treatment. If Pap tests have been discontinued for you, your risk factors (such as having a new sexual partner) need to be  reassessed to determine if you should start having screenings again. Some women have medical problems that increase the chance of getting cervical cancer. In these cases, your health care provider may recommend that you have screening and Pap tests more often.  If you have a family history of uterine cancer or ovarian cancer, talk with your health care provider about genetic screening.  If you have vaginal bleeding after reaching menopause, tell your health care provider.  There are currently no reliable tests available to screen for ovarian cancer.  Lung Cancer Lung cancer screening is recommended for adults 69-62 years old who are at high risk for lung cancer because of a history of smoking. A yearly low-dose CT scan of the lungs is recommended if you:  Currently smoke.  Have a history of at least 30 pack-years of smoking and you currently smoke or have quit within the past 15 years. A pack-year is smoking an average of one pack of cigarettes per day for one year.  Yearly screening should:  Continue until it has been 15 years since you quit.  Stop if you develop a health problem that would prevent you from having lung cancer treatment.  Colorectal Cancer  This type of cancer can be detected and can often be prevented.  Routine colorectal cancer screening usually begins at  age 42 and continues through age 45.  If you have risk factors for colon cancer, your health care provider may recommend that you be screened at an earlier age.  If you have a family history of colorectal cancer, talk with your health care provider about genetic screening.  Your health care provider may also recommend using home test kits to check for hidden blood in your stool.  A small camera at the end of a tube can be used to examine your colon directly (sigmoidoscopy or colonoscopy). This is done to check for the earliest forms of colorectal cancer.  Direct examination of the colon should be repeated every  5-10 years until age 71. However, if early forms of precancerous polyps or small growths are found or if you have a family history or genetic risk for colorectal cancer, you may need to be screened more often.  Skin Cancer  Check your skin from head to toe regularly.  Monitor any moles. Be sure to tell your health care provider: ? About any new moles or changes in moles, especially if there is a change in a mole's shape or color. ? If you have a mole that is larger than the size of a pencil eraser.  If any of your family members has a history of skin cancer, especially at a young age, talk with your health care provider about genetic screening.  Always use sunscreen. Apply sunscreen liberally and repeatedly throughout the day.  Whenever you are outside, protect yourself by wearing long sleeves, pants, a wide-brimmed hat, and sunglasses.  What should I know about osteoporosis? Osteoporosis is a condition in which bone destruction happens more quickly than new bone creation. After menopause, you may be at an increased risk for osteoporosis. To help prevent osteoporosis or the bone fractures that can happen because of osteoporosis, the following is recommended:  If you are 46-71 years old, get at least 1,000 mg of calcium and at least 600 mg of vitamin D per day.  If you are older than age 55 but younger than age 65, get at least 1,200 mg of calcium and at least 600 mg of vitamin D per day.  If you are older than age 54, get at least 1,200 mg of calcium and at least 800 mg of vitamin D per day.  Smoking and excessive alcohol intake increase the risk of osteoporosis. Eat foods that are rich in calcium and vitamin D, and do weight-bearing exercises several times each week as directed by your health care provider. What should I know about how menopause affects my mental health? Depression may occur at any age, but it is more common as you become older. Common symptoms of depression  include:  Low or sad mood.  Changes in sleep patterns.  Changes in appetite or eating patterns.  Feeling an overall lack of motivation or enjoyment of activities that you previously enjoyed.  Frequent crying spells.  Talk with your health care provider if you think that you are experiencing depression. What should I know about immunizations? It is important that you get and maintain your immunizations. These include:  Tetanus, diphtheria, and pertussis (Tdap) booster vaccine.  Influenza every year before the flu season begins.  Pneumonia vaccine.  Shingles vaccine.  Your health care provider may also recommend other immunizations. This information is not intended to replace advice given to you by your health care provider. Make sure you discuss any questions you have with your health care provider. Document Released: 09/06/2005  Document Revised: 02/02/2016 Document Reviewed: 04/18/2015 Elsevier Interactive Patient Education  2018 [employer]  

## 2017-11-05 DIAGNOSIS — R030 Elevated blood-pressure reading, without diagnosis of hypertension: Secondary | ICD-10-CM | POA: Diagnosis not present

## 2017-11-05 DIAGNOSIS — Z6832 Body mass index (BMI) 32.0-32.9, adult: Secondary | ICD-10-CM | POA: Diagnosis not present

## 2017-11-05 DIAGNOSIS — M47816 Spondylosis without myelopathy or radiculopathy, lumbar region: Secondary | ICD-10-CM | POA: Diagnosis not present

## 2017-11-05 DIAGNOSIS — M5416 Radiculopathy, lumbar region: Secondary | ICD-10-CM | POA: Diagnosis not present

## 2017-11-05 NOTE — Addendum Note (Signed)
Addended by: Alen Blew on: 11/05/2017 04:10 PM   Modules accepted: Level of Service

## 2017-11-11 ENCOUNTER — Ambulatory Visit (INDEPENDENT_AMBULATORY_CARE_PROVIDER_SITE_OTHER): Payer: Medicare Other | Admitting: Orthopaedic Surgery

## 2017-11-11 ENCOUNTER — Ambulatory Visit (INDEPENDENT_AMBULATORY_CARE_PROVIDER_SITE_OTHER): Payer: Medicare Other

## 2017-11-11 ENCOUNTER — Encounter (INDEPENDENT_AMBULATORY_CARE_PROVIDER_SITE_OTHER): Payer: Self-pay | Admitting: Orthopaedic Surgery

## 2017-11-11 VITALS — BP 132/78 | HR 85 | Ht 65.0 in | Wt 195.0 lb

## 2017-11-11 DIAGNOSIS — M1611 Unilateral primary osteoarthritis, right hip: Secondary | ICD-10-CM | POA: Diagnosis not present

## 2017-11-11 DIAGNOSIS — M25551 Pain in right hip: Secondary | ICD-10-CM | POA: Diagnosis not present

## 2017-11-11 DIAGNOSIS — M170 Bilateral primary osteoarthritis of knee: Secondary | ICD-10-CM

## 2017-11-11 DIAGNOSIS — G8929 Other chronic pain: Secondary | ICD-10-CM

## 2017-11-11 DIAGNOSIS — M25552 Pain in left hip: Secondary | ICD-10-CM

## 2017-11-11 DIAGNOSIS — Z96642 Presence of left artificial hip joint: Secondary | ICD-10-CM | POA: Diagnosis not present

## 2017-11-11 DIAGNOSIS — M25561 Pain in right knee: Secondary | ICD-10-CM

## 2017-11-11 NOTE — Progress Notes (Addendum)
Office Visit Note /orthopedic consultation   Patient: Emma Lewis           Date of Birth: Jun 30, 1951           MRN: [id number] Visit Date: 11/11/2017              Requested by: Philmore Pali, NP [place], [place] [address] PCP: Philmore Pali, NP   Assessment & Plan: Visit Diagnoses:  1. Chronic pain of right knee   2. Bilateral hip pain   3. Hx of total hip arthroplasty, left   4. Bilateral primary osteoarthritis of knee   5. Unilateral primary osteoarthritis, right hip     Plan: Patient's primary pain generator currently is right hip osteoarthritis.  Revision for polyethylene displacement looks good on x-ray left hip.  Can use 1/4 inch heel lift on the right side if she so desires.  After lumbar compression fractures or curvature of the lumbar spine with some pelvic obliquity may exacerbate her apparent leg length inequality.  She can return in several weeks after radiofrequency ablation of her lumbar facets for recheck.  We reviewed x-rays of her knees where she has osteoarthritis moderate in degree.  She understands that her x-rays demonstrate progressive right hip osteoarthritis and total hip arthroplasty was discussed.  Cervical spine fusion is doing well without problems.  I will recheck her in 1 month for repeat exam.  Thank you for the opportunity to see her in consultation.  Follow-Up Instructions: Return in about 1 month (around 12/09/2017).   Orders:  Orders Placed This Encounter  Procedures  . XR HIPS BILAT W OR W/O PELVIS 3-4 VIEWS  . XR Knee 1-2 Views Right   No orders of the defined types were placed in this encounter.     Procedures: No procedures performed   Clinical Data: No additional findings.   Subjective: Chief Complaint  Patient presents with  . Right Hip - Pain  . Left Hip - Pain  . Right Knee - Pain    HPI 67 year old female returns have not seen her in several years since her 2 level anterior cervical discectomy and fusion which is  doing well.  She is complaining of right groin pain as well as bilateral knee pain.  Right groin pain is worse radiates from the groin to the mid thigh.  Previous left total hip arthroplasty done elsewhere with early revision surgery done by Dr. Lyla Glassing from a posterior approach with acetabular revision due to displaced polyliner.  Patient notes that her left leg seems slightly long after the revision surgery.  She has some pelvic obliquity with mild lumbar curvature as well.  Patient had recent radiofrequency ablation for some mild to moderate facet degenerative changes post previous vertebroplasty 3 levels L3, L4, and L5.  She is getting radiofrequency  ablation on the contralateral side in the next week or 2.  Review of Systems 14 point review of system positive for acid reflux, history of bronchitis, history of DVT.  Previous knee arthroscopy.  3 shoulder surgeries.  2 level anterior cervical discectomy by me with good relief of pain.  Hip surgery with later revision surgery by Dr. Lyla Glassing done shortly after her original procedure for polyethylene problem.  No hip dislocation since the revision she has slight lengthening on the operative left leg quarter inch.  History of pneumonia and hypothyroidism.  History of fatty liver.  Previous compression fractures lumbar spine with 3 vertebroplasty is performed L3-L4 and  L5.  Otherwise negative as it pertains HPI.   Objective: Vital Signs: BP 132/78   Pulse 85   Ht 5\' 5"  (1.651 m)   Wt 195 lb (88.5 kg)   BMI 32.45 kg/m   Physical Exam  Constitutional: She is oriented to person, place, and time. She appears well-developed.  HENT:  Head: Normocephalic.  Right Ear: External ear normal.  Left Ear: External ear normal.  Eyes: Pupils are equal, round, and reactive to light.  Neck: No tracheal deviation present. No thyromegaly present.  Cardiovascular: Normal rate.  Pulmonary/Chest: Effort normal.  Abdominal: Soft.  Neurological: She is alert and  oriented to person, place, and time.  Skin: Skin is warm and dry.  Psychiatric: She has a normal mood and affect. Her behavior is normal.    Ortho Exam By blocks patient is 1/4 inch short on the right side.  She has limitation of internal rotation on the right hip at 20 degrees which reproduces her pain in the groin and anterior thigh that radiates down to her knee.  Bilateral patellofemoral crepitus with knee extension.  Collateral cruciate ligament exam right and left knee are intact.  Medial and lateral joint line tenderness but more patellofemoral pain with loading.  Sciatic sensation and motor is intact the lower extremities knee and ankle jerk are intact.  Well-healed left posterior hip incision.  Mild pelvic obliquity with mild lumbar curvature less than 15 degrees. Specialty Comments:  No specialty comments available.  Imaging: Xr Hips Bilat W Or W/o Pelvis 3-4 Views  Result Date: 11/12/2017 Standing AP pelvis 2 view hips and frog-leg lateral right hip obtained and reviewed.  This shows left total hip arthroplasty.  No evidence of loosening or subsidence.  Right hip shows joint space narrowing marginal osteophytes consistent with moderate right hip osteoarthritis. Impression: Previous left hip revision with moderate right  hip osteoarthritis.  Xr Knee 1-2 Views Right  Result Date: 11/12/2017 Standing AP both knees lateral right and left knee obtained and reviewed.  This shows bilateral patella [place] with patellofemoral degenerative changes medial more than lateral joint space narrowing consistent with moderate osteoarthritis  changes. Impression: Moderate knee osteoarthritis significant patellofemoral joints.    PMFS History: Patient Active Problem List   Diagnosis Date Noted  . Hx of total hip arthroplasty, left 11/12/2017  . Bilateral primary osteoarthritis of knee 11/12/2017  . GERD (gastroesophageal reflux disease) 01/17/2017  . Hypothyroidism 01/17/2017  . Vertigo 01/16/2017    . Nausea and vomiting 01/16/2017  . Essential hypertension 01/16/2017  . Fatty liver 01/16/2017  . 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 Pelvic mass 06/15/2015  . Closed wedge compression fracture of fifth lumbar vertebra ([facility])   . Cervical spondylosis 02/09/2014  . DDD (degenerative disc disease), lumbar 05/13/2013  . OA (osteoarthritis) 05/13/2013   Past Medical History:  Diagnosis Date  . Arthritis   . Bronchitis    hx of  . Fatty liver   . Fever blister    Takes Acyclovir  . GERD (gastroesophageal reflux disease)   . Hyperlipemia   . Hypothyroidism   . Migraines   . Osteopenia   . Pneumonia    hx of     YRS AGO  . PONV (postoperative nausea and vomiting)   . Seasonal allergies   . Vertigo     Family History  Problem Relation Age of Onset  . Lymphoma Mother     Past Surgical History:  Procedure Laterality Date  . ANTERIOR CERVICAL DECOMP/DISCECTOMY  FUSION N/A 02/09/2014   Procedure: ANTERIOR CERVICAL DECOMPRESSION/DISCECTOMY FUSION 2 LEVELS;  Surgeon: [name], MD;  Location: [facility];  Service: Orthopedics;  Laterality: N/A;  C4-5, C5-6 Anterior Cervical Discectomy and Fusion, Allograft, Plate  . BACK SURGERY     lumbar fusion  X2  . CATARACT EXTRACTION W/ INTRAOCULAR LENS  IMPLANT, BILATERAL    . COLONOSCOPY    . GANGLION CYST EXCISION Left 1970s   wrist  . HAND SURGERY  2018  . HIP ARTHROPLASTY Left 2011  . JOINT REPLACEMENT    . KNEE SURGERY Right    3 TOTAL  . ROBOTIC ASSISTED BILATERAL SALPINGO OOPHERECTOMY Bilateral 08/01/2015   Procedure: ROBOTIC ASSISTED BILATERAL SALPINGO OOPHORECTOMY;  Surgeon: Everitt Amber, MD;  Location[facility];  Service: Gynecology;  Laterality: Bilateral;  . SHOULDER OPEN ROTATOR CUFF REPAIR Right    3 TOTAL  . SHOULDER SURGERY Right   . TOTAL HIP REVISION Left 06/10/2016   Procedure: TOTAL HIP REVISION ARTHROPLASTY;  Surgeon: Rod Can, MD;  Location: [facility];  Service: Orthopedics;  Laterality: Left;   Social  History   Occupational History  . Not on file  Tobacco Use  . Smoking status: Never Smoker  . Smokeless tobacco: Never Used  Substance and Sexual Activity  . Alcohol use: Yes    Comment: rare  . Drug use: No  . Sexual activity: Not Currently

## 2017-11-12 ENCOUNTER — Encounter (INDEPENDENT_AMBULATORY_CARE_PROVIDER_SITE_OTHER): Payer: Self-pay | Admitting: Orthopaedic Surgery

## 2017-11-12 DIAGNOSIS — M170 Bilateral primary osteoarthritis of knee: Secondary | ICD-10-CM | POA: Insufficient documentation

## 2017-11-12 DIAGNOSIS — Z96642 Presence of left artificial hip joint: Secondary | ICD-10-CM | POA: Insufficient documentation

## 2017-11-12 DIAGNOSIS — R030 Elevated blood-pressure reading, without diagnosis of hypertension: Secondary | ICD-10-CM | POA: Diagnosis not present

## 2017-11-12 DIAGNOSIS — Z6832 Body mass index (BMI) 32.0-32.9, adult: Secondary | ICD-10-CM | POA: Diagnosis not present

## 2017-11-12 DIAGNOSIS — M47816 Spondylosis without myelopathy or radiculopathy, lumbar region: Secondary | ICD-10-CM | POA: Diagnosis not present

## 2017-11-12 DIAGNOSIS — M1611 Unilateral primary osteoarthritis, right hip: Secondary | ICD-10-CM | POA: Insufficient documentation

## 2017-11-12 DIAGNOSIS — Z96649 Presence of unspecified artificial hip joint: Secondary | ICD-10-CM | POA: Insufficient documentation

## 2017-11-12 NOTE — Addendum Note (Signed)
Addended by: [name] on: 11/12/2017 08:59 AM   Modules accepted: Level of Service

## 2017-11-25 DIAGNOSIS — R928 Other abnormal and inconclusive findings on diagnostic imaging of breast: Secondary | ICD-10-CM | POA: Diagnosis not present

## 2017-12-04 DIAGNOSIS — M8589 Other specified disorders of bone density and structure, multiple sites: Secondary | ICD-10-CM | POA: Diagnosis not present

## 2017-12-04 DIAGNOSIS — M85832 Other specified disorders of bone density and structure, left forearm: Secondary | ICD-10-CM | POA: Diagnosis not present

## 2017-12-09 ENCOUNTER — Ambulatory Visit (INDEPENDENT_AMBULATORY_CARE_PROVIDER_SITE_OTHER): Payer: Medicare Other | Admitting: Orthopaedic Surgery

## 2017-12-09 VITALS — BP 145/86 | HR 77 | Ht 65.0 in | Wt 195.0 lb

## 2017-12-09 DIAGNOSIS — M1611 Unilateral primary osteoarthritis, right hip: Secondary | ICD-10-CM | POA: Diagnosis not present

## 2017-12-09 NOTE — Progress Notes (Signed)
Office Visit Note   Patient: Emma Lewis           Date of Birth: 21-Nov-1950           MRN: [id number] Visit Date: 12/09/2017              Requested by: Philmore Pali, NP [address] [place], [place] [address] PCP: Philmore Pali, NP   Assessment & Plan: Visit Diagnoses:  1. Unilateral primary osteoarthritis, right hip     Plan: Patient's had progressive symptoms in her hip with end-stage osteoarthritis.  She is amatory with a limp and is having increased ambulation problems.  She got some improvement with an epidural injection with temporary cortisone effect and is failed anti-inflammatories, use of a cane.  We discussed options and patient would like to proceed with right total hip arthroplasty.  She will require medical preoperative clearance from primary care physician.  In direct anterior total hip arthroplasty.  She is one half and short on the right side and this can be at least partially corrected at the time of surgery.  Risk surgery discussed including trochanteric fracture, revision surgery, reoperation, joint infection, risks of anesthesia cardiac event, stroke.  Questions were elicited and answered she understands and requests we proceed.  Follow-Up Instructions: No follow-ups on file.   Orders:  No orders of the defined types were placed in this encounter.  No orders of the defined types were placed in this encounter.     Procedures: No procedures performed   Clinical Data: No additional findings.   Subjective: Chief Complaint  Patient presents with  . Left Hip - Follow-up    HPI  Review of Systems previous to level cervical fusion 2015 doing well.  Left hip arthroplasty with later revision surgery November 2017 by Dr. Lyla Glassing.  No instability since her surgery 06/10/2016.  Progressive right hip osteoarthritis.  Previous vertebroplasty's lumbar times   3 in 2016 for compression fractures L3 and L5 adjacent to previous vertebroplasty L4.  Osteoporosis.  Recent  bone density test was shot to the lumbar region not hips and included vertebroplasty areas with false positive increase.  Positive for GERD hypothyroidism hyperlipidemia, hypertension, history of pneumonia remote.  Previous cervical fusion.  Previous robotic bilateral salpingo-oophorectomy 2017.  Lumbar epidural April 2019 Dr. Brien Few.  Otherwise negative as it pertains HPI, 14 point review of systems.   Objective: Vital Signs: BP (!) 145/86   Pulse 77   Ht 5\' 5"  (1.651 m)   Wt 195 lb (88.5 kg)   BMI 32.45 kg/m   Physical Exam  Constitutional: She is oriented to person, place, and time. She appears well-developed.  HENT:  Head: Normocephalic.  Right Ear: External ear normal.  Left Ear: External ear normal.  Eyes: Pupils are equal, round, and reactive to light.  Neck: No tracheal deviation present. No thyromegaly present.  Cardiovascular: Normal rate.  Pulmonary/Chest: Effort normal.  Abdominal: Soft.  Neurological: She is alert and oriented to person, place, and time.  Skin: Skin is warm and dry.  Psychiatric: She has a normal mood and affect. Her behavior is normal.    Ortho Exam patient has well-healed cervical incision no brachial plexus tenderness.  No suprapubic lymphadenopathy.  She has pain with internal rotation 20 degrees right hip no hip flexion contracture.  External rotation right hip 30 degrees with reproduction of her pain.  Groin pain and anterior thigh pain.  Good hip range of motion on the left well-healed posterior  incision left hip.  She has 1/2 inch shortening on the right hip.  Distal pulses are intact.  No sciatic notch tenderness.  Specialty Comments:  No specialty comments available.  Imaging: No results found.   PMFS History: Patient Active Problem List   Diagnosis Date Noted  . Hx of total hip arthroplasty, left 11/12/2017  . Bilateral primary osteoarthritis of knee 11/12/2017  . Unilateral primary osteoarthritis, right hip 11/12/2017  . GERD  (gastroesophageal reflux disease) 01/17/2017  . Hypothyroidism 01/17/2017  . Vertigo 01/16/2017  . Nausea and vomiting 01/16/2017  . Essential hypertension 01/16/2017  . Fatty liver 01/16/2017  . 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 Pelvic mass 06/15/2015  . Closed wedge compression fracture of fifth lumbar vertebra ([facility])   . Cervical spondylosis 02/09/2014  . DDD (degenerative disc disease), lumbar 05/13/2013  . OA (osteoarthritis) 05/13/2013   Past Medical History:  Diagnosis Date  . Arthritis   . Bronchitis    hx of  . Fatty liver   . Fever blister    Takes Acyclovir  . GERD (gastroesophageal reflux disease)   . Hyperlipemia   . Hypothyroidism   . Migraines   . Osteopenia   . Pneumonia    hx of     YRS AGO  . PONV (postoperative nausea and vomiting)   . Seasonal allergies   . Vertigo     Family History  Problem Relation Age of Onset  . Lymphoma Mother     Past Surgical History:  Procedure Laterality Date  . ANTERIOR CERVICAL DECOMP/DISCECTOMY FUSION N/A 02/09/2014   Procedure: ANTERIOR CERVICAL DECOMPRESSION/DISCECTOMY FUSION 2 LEVELS;  Surgeon: [name], MD;  Location: [facility];  Service: Orthopedics;  Laterality: N/A;  C4-5, C5-6 Anterior Cervical Discectomy and Fusion, Allograft, Plate  . BACK SURGERY     lumbar fusion  X2  . CATARACT EXTRACTION W/ INTRAOCULAR LENS  IMPLANT, BILATERAL    . COLONOSCOPY    . GANGLION CYST EXCISION Left 1970s   wrist  . HAND SURGERY  2018  . HIP ARTHROPLASTY Left 2011  . JOINT REPLACEMENT    . KNEE SURGERY Right    3 TOTAL  . ROBOTIC ASSISTED BILATERAL SALPINGO OOPHERECTOMY Bilateral 08/01/2015   Procedure: ROBOTIC ASSISTED BILATERAL SALPINGO OOPHORECTOMY;  Surgeon: Everitt Amber, MD;  [facility];  Service: Gynecology;  Laterality: Bilateral;  . SHOULDER OPEN ROTATOR CUFF REPAIR Right    3 TOTAL  . SHOULDER SURGERY Right   . TOTAL HIP REVISION Left 06/10/2016   Procedure: TOTAL HIP REVISION ARTHROPLASTY;   Surgeon: Rod Can, MD;  Location: [facility];  Service: Orthopedics;  Laterality: Left;   Social History   Occupational History  . Not on file  Tobacco Use  . Smoking status: Never Smoker  . Smokeless tobacco: Never Used  Substance and Sexual Activity  . Alcohol use: Yes    Comment: rare  . Drug use: No  . Sexual activity: Not Currently

## 2017-12-10 ENCOUNTER — Encounter (INDEPENDENT_AMBULATORY_CARE_PROVIDER_SITE_OTHER): Payer: Self-pay | Admitting: Orthopaedic Surgery

## 2017-12-12 DIAGNOSIS — Z01818 Encounter for other preprocedural examination: Secondary | ICD-10-CM | POA: Diagnosis not present

## 2017-12-12 DIAGNOSIS — E785 Hyperlipidemia, unspecified: Secondary | ICD-10-CM | POA: Diagnosis not present

## 2017-12-12 DIAGNOSIS — E039 Hypothyroidism, unspecified: Secondary | ICD-10-CM | POA: Diagnosis not present

## 2017-12-12 DIAGNOSIS — R7303 Prediabetes: Secondary | ICD-10-CM | POA: Diagnosis not present

## 2017-12-15 DIAGNOSIS — M47816 Spondylosis without myelopathy or radiculopathy, lumbar region: Secondary | ICD-10-CM | POA: Diagnosis not present

## 2017-12-15 DIAGNOSIS — M48062 Spinal stenosis, lumbar region with neurogenic claudication: Secondary | ICD-10-CM | POA: Diagnosis not present

## 2017-12-23 ENCOUNTER — Other Ambulatory Visit (INDEPENDENT_AMBULATORY_CARE_PROVIDER_SITE_OTHER): Payer: Self-pay

## 2017-12-24 ENCOUNTER — Ambulatory Visit (INDEPENDENT_AMBULATORY_CARE_PROVIDER_SITE_OTHER): Payer: Medicare Other | Admitting: Surgery

## 2017-12-24 ENCOUNTER — Other Ambulatory Visit: Payer: Self-pay | Admitting: Orthopaedic Surgery

## 2017-12-24 ENCOUNTER — Encounter (INDEPENDENT_AMBULATORY_CARE_PROVIDER_SITE_OTHER): Payer: Self-pay | Admitting: Surgery

## 2017-12-24 VITALS — BP 120/78 | HR 80 | Temp 97.9°F | Resp 16 | Ht 65.0 in | Wt 189.0 lb

## 2017-12-24 DIAGNOSIS — M1611 Unilateral primary osteoarthritis, right hip: Secondary | ICD-10-CM | POA: Diagnosis not present

## 2017-12-24 NOTE — Progress Notes (Signed)
67 year old white female history of end-stage DJD right hip and pain presents for preop evaluation for total hip replacement.  Patient states that she has concerns about right leg length discrepancy that she has after having left total hip revision by Dr. Lyla Glassing a couple of years ago.  Advised patient that during surgery Dr. Lorin Mercy will do the best that he can to get her leg lengths equal or close to it.  She stated that Dr. Lorin Mercy had already had this discussion with her at last office visit and he again discussed this with her today in the clinic.  Today full H&P was placed in patient's chart.

## 2017-12-24 NOTE — H&P (Signed)
TOTAL HIP ADMISSION H&P  Patient is admitted for right total hip arthroplasty.  Subjective:  Chief Complaint: right hip pain  HPI: Emma Lewis, 67 y.o. female, has a history of pain and functional disability in the right hip(s) due to arthritis and patient has failed non-surgical conservative treatments for greater than 12 weeks to include NSAID's and/or analgesics, use of assistive devices, weight reduction as appropriate and activity modification.  Onset of symptoms was gradual starting 10 years ago with gradually worsening course since that time. Patient currently rates pain in the right hip at 10 out of 10 with activity. Patient has night pain, worsening of pain with activity and weight bearing, trendelenberg gait, pain that interfers with activities of daily living and pain with passive range of motion. Patient has evidence of subchondral sclerosis, periarticular osteophytes and joint space narrowing by imaging studies. This condition presents safety issues increasing the risk of falls.  There is no current active infection.  Patient Active Problem List   Diagnosis Date Noted  . Hx of total hip arthroplasty, left 11/12/2017  . Bilateral primary osteoarthritis of knee 11/12/2017  . Unilateral primary osteoarthritis, right hip 11/12/2017  . GERD (gastroesophageal reflux disease) 01/17/2017  . Hypothyroidism 01/17/2017  . Vertigo 01/16/2017  . Nausea and vomiting 01/16/2017  . Essential hypertension 01/16/2017  . Fatty liver 01/16/2017  . 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 Pelvic mass 06/15/2015  . Closed wedge compression fracture of fifth lumbar vertebra ([facility])   . Cervical spondylosis 02/09/2014  . DDD (degenerative disc disease), lumbar 05/13/2013  . OA (osteoarthritis) 05/13/2013   Past Medical History:  Diagnosis Date  . Arthritis   . Bronchitis    hx of  . Fatty liver   . Fever blister    Takes Acyclovir  . GERD (gastroesophageal reflux disease)   .  Hyperlipemia   . Hypothyroidism   . Migraines   . Osteopenia   . Pneumonia    hx of     YRS AGO  . PONV (postoperative nausea and vomiting)   . Seasonal allergies   . Vertigo     Past Surgical History:  Procedure Laterality Date  . ANTERIOR CERVICAL DECOMP/DISCECTOMY FUSION N/A 02/09/2014   Procedure: ANTERIOR CERVICAL DECOMPRESSION/DISCECTOMY FUSION 2 LEVELS;  Surgeon: [name], MD;  Location: [facility];  Service: Orthopedics;  Laterality: N/A;  C4-5, C5-6 Anterior Cervical Discectomy and Fusion, Allograft, Plate  . BACK SURGERY     lumbar fusion  X2  . CATARACT EXTRACTION W/ INTRAOCULAR LENS  IMPLANT, BILATERAL    . COLONOSCOPY    . GANGLION CYST EXCISION Left 1970s   wrist  . HAND SURGERY  2018  . HIP ARTHROPLASTY Left 2011  . JOINT REPLACEMENT    . KNEE SURGERY Right    3 TOTAL  . ROBOTIC ASSISTED BILATERAL SALPINGO OOPHERECTOMY Bilateral 08/01/2015   Procedure: ROBOTIC ASSISTED BILATERAL SALPINGO OOPHORECTOMY;  Surgeon: Everitt Amber, MD;  [facility];  Service: Gynecology;  Laterality: Bilateral;  . SHOULDER OPEN ROTATOR CUFF REPAIR Right    3 TOTAL  . SHOULDER SURGERY Right   . TOTAL HIP REVISION Left 06/10/2016   Procedure: TOTAL HIP REVISION ARTHROPLASTY;  Surgeon: Rod Can, MD;  Location: [facility];  Service: Orthopedics;  Laterality: Left;    No current facility-administered medications for this encounter.    Current Outpatient Medications  Medication Sig Dispense Refill Last Dose  . acyclovir (ZOVIRAX) 400 MG tablet Take 400 mg by mouth as directed.  Take 1 tablet daily unless flare up then takes twice daily for 3 days   Taking  . alendronate (FOSAMAX) 70 MG tablet Take 70 mg by mouth every Sunday.  1 Taking  . Artificial Tear Solution (SOOTHE XP) SOLN Place 1 drop into both eyes daily as needed (dry eyes).   Taking  . Calcium Carb-Cholecalciferol (CALCIUM 600 + D PO) Take 1 tablet by mouth 3 (three) times daily.    Taking  . cholecalciferol (VITAMIN D) 1000 units  tablet Take 2,000 Units by mouth 3 (three) times daily.   Taking  . clobetasol cream (TEMOVATE) 7.09 % Apply 1 application topically 2 (two) times daily. 30 g 1 Taking  . Evening Primrose Oil 500 MG CAPS Take 1 capsule by mouth 3 (three) times daily.   Taking  . HYDROcodone-acetaminophen (NORCO) 7.5-325 MG tablet Take 1-2 tablets by mouth every 4 (four) hours as needed (breakthrough pain). (Patient not taking: Reported on 11/04/2017) 60 tablet 0 Not Taking  . ibuprofen (ADVIL,MOTRIN) 200 MG tablet Take 800 mg by mouth 2 (two) times daily.    Taking  . KRILL OIL PO Take 1 tablet by mouth daily.   Taking  . levothyroxine (SYNTHROID, LEVOTHROID) 75 MCG tablet Take 75 mcg by mouth daily before breakfast.   Taking  . lidocaine (ASPERCREME W/LIDOCAINE) 4 % cream Apply 1 application topically as needed.   Taking  . meclizine (ANTIVERT) 25 MG tablet Take 1 tablet (25 mg total) by mouth 3 (three) times daily as needed for dizziness. Vertigo. Also available OTC 90 tablet 3 Taking  . omeprazole (PRILOSEC) 20 MG capsule Take 10 mg by mouth daily.    Taking  . ondansetron (ZOFRAN ODT) 4 MG disintegrating tablet Take 1 tablet (4 mg total) by mouth every 8 (eight) hours as needed for nausea or vomiting. (Patient not taking: Reported on 11/04/2017) 20 tablet 0 Not Taking  . simvastatin (ZOCOR) 40 MG tablet Take 40 mg by mouth daily.   Taking  . Specialty Vitamins Products (BIOTIN PLUS KERATIN) 10000-100 MCG-MG TABS Take 1 tablet by mouth daily.   Taking  . TURMERIC CURCUMIN PO Take 1 capsule by mouth 2 (two) times daily.   Taking   Allergies  Allergen Reactions  . Sulfa Antibiotics Other (See Comments)    Mother said "I liked to have died"  . Simvastatin Other (See Comments)    Fatty liver disease  . Fenofibrate Rash    She didn't feel right    Social History   Tobacco Use  . Smoking status: Never Smoker  . Smokeless tobacco: Never Used  Substance Use Topics  . Alcohol use: Yes    Comment: rare     Family History  Problem Relation Age of Onset  . Lymphoma Mother      Review of Systems  Constitutional: Negative.   HENT:       Admits to having some postnasal drainage.  Some issues with seasonal allergies  Respiratory: Positive for cough.   Cardiovascular: Negative.   Gastrointestinal: Negative.   Genitourinary: Negative.   Musculoskeletal: Positive for joint pain.  Skin: Negative.   Neurological: Negative.   Psychiatric/Behavioral: Negative.     Objective:  Physical Exam  Constitutional: She is oriented to person, place, and time. She appears well-developed. No distress.  HENT:  Head: Normocephalic and atraumatic.  Eyes: Pupils are equal, round, and reactive to light. EOM are normal.  Neck: Normal range of motion.  Cardiovascular: Normal heart sounds.  Respiratory: Effort normal.  No respiratory distress. She has no wheezes.  GI: Soft. Bowel sounds are normal. She exhibits no distension. There is no tenderness.  Musculoskeletal:  Gait antalgic.  Right groin pain with about 10 degrees internal/external rotation.  Negative straight leg raise  Neurological: She is alert and oriented to person, place, and time.  Skin: Skin is warm and dry.  Psychiatric: She has a normal mood and affect.    Vital signs in last 24 hours: BP: ()/()  Arterial Line BP: ()/()   Labs:   Estimated body mass index is 31.45 kg/m as calculated from the following:   Height as of 12/24/17: 5\' 5"  (1.651 m).   Weight as of 12/24/17: 189 lb (85.7 kg).   Imaging Review Plain radiographs demonstrate moderate degenerative joint disease of the right hip(s). The bone quality appears to be good for age and reported activity level.    Preoperative templating of the joint replacement has been completed, documented, and submitted to the Operating Room personnel in order to optimize intra-operative equipment management.     Assessment/Plan:  End stage arthritis, right hip(s)  The patient history,  physical examination, clinical judgement of the provider and imaging studies are consistent with end stage degenerative joint disease of the right hip(s) and total hip arthroplasty is deemed medically necessary. The treatment options including medical management, injection therapy, arthroscopy and arthroplasty were discussed at length. The risks and benefits of total hip arthroplasty were presented and reviewed. The risks due to aseptic loosening, infection, stiffness, dislocation/subluxation,  thromboembolic complications and other imponderables were discussed.  The patient acknowledged the explanation, agreed to proceed with the plan and consent was signed. Patient is being admitted for inpatient treatment for surgery, pain control, PT, OT, prophylactic antibiotics, VTE prophylaxis, progressive ambulation and ADL's and discharge planning.The patient is planning to be discharged home with home health services

## 2017-12-24 NOTE — Care Plan (Signed)
Spoke with patient. She plans to discharge to home with family and HHPT provided by [facility]. She has all needed equipment at home. She is set to follow up with Dr. Lorin Mercy on 02/03/18 @ 845a. She will transition to OPPT at that time, if needed. She prefers [employer].   Please contact [facility], [phone] with questions or if this plan needs to change.   Thanks

## 2017-12-25 ENCOUNTER — Ambulatory Visit (INDEPENDENT_AMBULATORY_CARE_PROVIDER_SITE_OTHER): Payer: Medicare Other | Admitting: Surgery

## 2017-12-30 DIAGNOSIS — B379 Candidiasis, unspecified: Secondary | ICD-10-CM | POA: Diagnosis not present

## 2018-01-08 NOTE — Pre-Procedure Instructions (Signed)
RAYNETTE ARRAS  01/08/2018      [facility] [place] [facility] [place], [address] [place] [address] Phone: [phone] Fax: [phone]    Your procedure is scheduled on January 21, 2018.  Report to [facility] Admitting at 1030 AM.  Call this number if you have problems the morning of surgery:  [phone]   Remember:  Do not eat or drink after midnight.   Continue all medications as directed by your physician except follow these medication instructions before surgery below   Take these medicines the morning of surgery with A SIP OF WATER  Eye drops-if needed Levothyroxine (synthroid) meclizine (antivert)-if needed for vertigo Omeprazole (prilosec)   7 days prior to surgery STOP taking any Aspirin (unless otherwise instructed by your surgeon), Aleve, Naproxen, Ibuprofen, Motrin, Advil, Goody's, BC's, all herbal medications, fish oil, and all vitamins   Do not wear jewelry, make-up or nail polish.  Do not wear lotions, powders, or perfumes, or deodorant.  Do not shave 48 hours prior to surgery.    Do not bring valuables to the hospital.  [facility] is not responsible for any belongings or valuables.  Contacts, dentures or bridgework may not be worn into surgery.  Leave your suitcase in the car.  After surgery it may be brought to your room.  For patients admitted to the hospital, discharge time will be determined by your treatment team.  Patients discharged the day of surgery will not be allowed to drive home.    [place]- Preparing For Surgery  Before surgery, you can play an important role. Because skin is not sterile, your skin needs to be as free of germs as possible. You can reduce the number of germs on your skin by washing with CHG (chlorahexidine gluconate) Soap before surgery.  CHG is an antiseptic cleaner which kills germs and bonds with the skin to continue killing germs even after  washing.    Oral Hygiene is also important to reduce your risk of infection.  Remember - BRUSH YOUR TEETH THE MORNING OF SURGERY WITH YOUR REGULAR TOOTHPASTE  Please do not use if you have an allergy to CHG or antibacterial soaps. If your skin becomes reddened/irritated stop using the CHG.  Do not shave (including legs and underarms) for at least 48 hours prior to first CHG shower. It is OK to shave your face.  Please follow these instructions carefully.   1. Shower the NIGHT BEFORE SURGERY and the MORNING OF SURGERY with CHG.   2. If you chose to wash your hair, wash your hair first as usual with your normal shampoo.  3. After you shampoo, rinse your hair and body thoroughly to remove the shampoo.  4. Use CHG as you would any other liquid soap. You can apply CHG directly to the skin and wash gently with a scrungie or a clean washcloth.   5. Apply the CHG Soap to your body ONLY FROM THE NECK DOWN.  Do not use on open wounds or open sores. Avoid contact with your eyes, ears, mouth and genitals (private parts). Wash Face and genitals (private parts)  with your normal soap.  6. Wash thoroughly, paying special attention to the area where your surgery will be performed.  7. Thoroughly rinse your body with warm water from the neck down.  8. DO NOT shower/wash with your normal soap after  using and rinsing off the CHG Soap.  9. Pat yourself dry with a CLEAN TOWEL.  10. Wear CLEAN PAJAMAS to bed the night before surgery, wear comfortable clothes the morning of surgery  11. Place CLEAN SHEETS on your bed the night of your first shower and DO NOT SLEEP WITH PETS.  Day of Surgery:  Do not apply any deodorants/lotions.  Please wear clean clothes to the hospital/surgery center.   Remember to brush your teeth WITH YOUR REGULAR TOOTHPASTE.  Please read over the following fact sheets that you were given. Pain Booklet, Coughing and Deep Breathing, MRSA Information and Surgical Site Infection  Prevention

## 2018-01-08 NOTE — Progress Notes (Addendum)
PYK:DXIP Lam, NP  Cardiologist: pt denies  EKG: 11/2017- requested from [facility] in [place] test: pt denies  ECHO: 01/17/17 in Epic  Cardiac Cath: pt denies  Chest x-ray: obtained today

## 2018-01-09 ENCOUNTER — Ambulatory Visit (HOSPITAL_COMMUNITY)
Admission: RE | Admit: 2018-01-09 | Discharge: 2018-01-09 | Disposition: A | Discharge status: Home / Self Care | Payer: Medicare Other | Source: Ambulatory Visit | Attending: Surgery | Admitting: Surgery

## 2018-01-09 ENCOUNTER — Other Ambulatory Visit: Payer: Self-pay

## 2018-01-09 ENCOUNTER — Other Ambulatory Visit (INDEPENDENT_AMBULATORY_CARE_PROVIDER_SITE_OTHER): Payer: Self-pay | Admitting: Radiology

## 2018-01-09 ENCOUNTER — Encounter (HOSPITAL_COMMUNITY)
Admission: RE | Admit: 2018-01-09 | Discharge: 2018-01-09 | Disposition: A | Discharge status: Home / Self Care | Payer: Medicare Other | Source: Ambulatory Visit | Attending: Orthopaedic Surgery | Admitting: Orthopaedic Surgery

## 2018-01-09 ENCOUNTER — Encounter (HOSPITAL_COMMUNITY): Payer: Self-pay

## 2018-01-09 DIAGNOSIS — Z01818 Encounter for other preprocedural examination: Secondary | ICD-10-CM

## 2018-01-09 DIAGNOSIS — M1611 Unilateral primary osteoarthritis, right hip: Secondary | ICD-10-CM | POA: Diagnosis not present

## 2018-01-09 DIAGNOSIS — Z471 Aftercare following joint replacement surgery: Secondary | ICD-10-CM | POA: Diagnosis not present

## 2018-01-09 DIAGNOSIS — Z01812 Encounter for preprocedural laboratory examination: Secondary | ICD-10-CM | POA: Diagnosis not present

## 2018-01-09 DIAGNOSIS — E669 Obesity, unspecified: Secondary | ICD-10-CM | POA: Diagnosis not present

## 2018-01-09 DIAGNOSIS — Z96649 Presence of unspecified artificial hip joint: Secondary | ICD-10-CM | POA: Diagnosis not present

## 2018-01-09 DIAGNOSIS — Z713 Dietary counseling and surveillance: Secondary | ICD-10-CM | POA: Diagnosis not present

## 2018-01-09 LAB — URINALYSIS, ROUTINE W REFLEX MICROSCOPIC
Bilirubin Urine: NEGATIVE
GLUCOSE, UA: NEGATIVE mg/dL
HGB URINE DIPSTICK: NEGATIVE
Ketones, ur: NEGATIVE mg/dL
NITRITE: NEGATIVE
PROTEIN: NEGATIVE mg/dL
SPECIFIC GRAVITY, URINE: 1.014 (ref 1.005–1.030)
pH: 7 (ref 5.0–8.0)

## 2018-01-09 LAB — CBC
HEMATOCRIT: 40.9 % (ref 36.0–46.0)
HEMOGLOBIN: 13.2 g/dL (ref 12.0–15.0)
MCH: 30.6 pg (ref 26.0–34.0)
MCHC: 32.3 g/dL (ref 30.0–36.0)
MCV: 94.7 fL (ref 78.0–100.0)
Platelets: 279 10*3/uL (ref 150–400)
RBC: 4.32 MIL/uL (ref 3.87–5.11)
RDW: 13.7 % (ref 11.5–15.5)
WBC: 11.7 10*3/uL — ABNORMAL HIGH (ref 4.0–10.5)

## 2018-01-09 LAB — COMPREHENSIVE METABOLIC PANEL
ALK PHOS: 226 U/L — AB (ref 38–126)
ALT: 57 U/L — AB (ref 14–54)
ANION GAP: 9 (ref 5–15)
AST: 74 U/L — ABNORMAL HIGH (ref 15–41)
Albumin: 3.4 g/dL — ABNORMAL LOW (ref 3.5–5.0)
BILIRUBIN TOTAL: 0.7 mg/dL (ref 0.3–1.2)
BUN: 12 mg/dL (ref 6–20)
CALCIUM: 9.3 mg/dL (ref 8.9–10.3)
CO2: 26 mmol/L (ref 22–32)
Chloride: 104 mmol/L (ref 101–111)
Creatinine, Ser: 0.76 mg/dL (ref 0.44–1.00)
Glucose, Bld: 105 mg/dL — ABNORMAL HIGH (ref 65–99)
Potassium: 3.9 mmol/L (ref 3.5–5.1)
SODIUM: 139 mmol/L (ref 135–145)
TOTAL PROTEIN: 7.4 g/dL (ref 6.5–8.1)

## 2018-01-09 LAB — SURGICAL PCR SCREEN
MRSA, PCR: NEGATIVE
Staphylococcus aureus: POSITIVE — AB

## 2018-01-09 MED ORDER — CIPROFLOXACIN HCL 500 MG PO TABS: ORAL_TABLET | ORAL | 0 refills | Status: DC

## 2018-01-09 NOTE — Progress Notes (Signed)
Sent to pharmacy 

## 2018-01-12 ENCOUNTER — Telehealth (INDEPENDENT_AMBULATORY_CARE_PROVIDER_SITE_OTHER): Payer: Self-pay | Admitting: Orthopaedic Surgery

## 2018-01-12 NOTE — Telephone Encounter (Signed)
Ebony Hail, [place] anesthesiologist at [facility] calling in reference to urinalysis for patient.  She isn't sure what to make of it with moderate leukocytes, but negative nitrites. Microscope showed 21 to 50 WBC. Only a few bacteria, so white count at 11.7 was slightly elevated.  Treat or not?

## 2018-01-12 NOTE — Telephone Encounter (Signed)
Patient is scheduled for RIGHT TOTAL HIP ARTHROPLASTY June 26th, 2019, but is concerned with the LEFT hip. She states there is a knot at the base of the incision where she had the Casar which is warm to the touch. She mentioned this at her history and physical, but she felt that the doctor didn't think that it was cause for concern.  It has been worse in the past, but she is currently using ice packs.  She also has a shooting pain in the leg and is unable to put any weight when there is pain shooting from the knot down into the lower leg.  She is fearful that the LEFT leg may not be healed enough to handle the surgery on the right. PLEASE ADVISE.

## 2018-01-12 NOTE — Progress Notes (Signed)
Anesthesia Chart Review:  Case:  [id number] Date/Time:  01/21/18 1215   Procedure:  RIGHT TOTAL HIP ARTHROPLASTY DIRECT ANTERIOR (Right )   Anesthesia type:  Spinal   Pre-op diagnosis:  right hip osteoarthritis   Location:  [facility] OR ROOM 06 / [facility]   Surgeon:  [name], MD      DISCUSSION: Patient is a 67 year old female scheduled for the above procedure. History includes never smoker, post-operative N/V, hypothyroidism, HLD, fatty liver, vertigo, GERD. Patient has clearance for surgery from her PCP following recent visit. Message left for Debbie at Dr. Lorin Mercy' office regarding UA and WBC results--defer treatment recommendations, if any, to surgeon (it looks like Cipro x5 days ordered 01/09/18 by Dr. Lorin Mercy). Today I instructed her to hold phentermine at least one week before surgery.  Based on currently available information, I would anticipate that she can proceed as planned from an anesthesia standpoint.  VS: BP 133/62   Pulse 95   Temp 36.4 C (Oral)   Resp 18   Ht 5' 5" (1.651 m)   Wt 192 lb 14.4 oz (87.5 kg)   SpO2 98%   BMI 32.10 kg/m   PROVIDERS: Philmore Pali, NP is PCP who signed a surgical clearance from a medical and cardiac standpoint following a 12/12/17 visit with EKG and labs.  NAFLD is followed by [facility].     LABS: Preoperative labs noted. Cr 0.76. Glucose 105. Alk Phos 226 (previously 192 12/12/17 [facility]). AST 74 (previously 65 on 01/17/17, 89 12/12/17), ALT 57 (previously 44 01/17/17, 50 12/12/17 ). WBC 11.7. H/H 13.2/40.9. PLT 279. UA showed moderate leukocytes, negative nitrites.  (all labs ordered are listed, but only abnormal results are displayed)  Labs Reviewed  SURGICAL PCR SCREEN - Abnormal; Notable for the following components:      Result Value   Staphylococcus aureus POSITIVE (*)    All other components within normal limits  CBC - Abnormal; Notable for the following components:   WBC 11.7 (*)    All other components within normal limits   COMPREHENSIVE METABOLIC PANEL - Abnormal; Notable for the following components:   Glucose, Bld 105 (*)    Albumin 3.4 (*)    AST 74 (*)    ALT 57 (*)    Alkaline Phosphatase 226 (*)    All other components within normal limits  URINALYSIS, ROUTINE W REFLEX MICROSCOPIC - Abnormal; Notable for the following components:   Leukocytes, UA MODERATE (*)    Bacteria, UA FEW (*)    All other components within normal limits     IMAGES: CXR 01/09/18: IMPRESSION: No active cardiopulmonary disease.   EKG: 12/12/17 [facility]): NSR.   CV: Echo 01/17/17: Study Conclusions - Left ventricle: The cavity size was normal. Systolic function was   normal. The estimated ejection fraction was in the range of 55%   to 60%. Wall motion was normal; there were no regional wall   motion abnormalities. Left ventricular diastolic function   parameters were normal. - Aortic valve: Transvalvular velocity was within the normal range.   There was no stenosis. There was no regurgitation. - Mitral valve: Transvalvular velocity was within the normal range.   There was no evidence for stenosis. There was trivial   regurgitation. - Right ventricle: The cavity size was normal. Wall thickness was   normal. Systolic function was normal. - Pulmonary arteries: Systolic pressure was within the normal   range. PA peak pressure: 25 mm Hg (  S). - Pericardium, extracardiac: A trivial pericardial effusion was   identified.   Past Medical History:  Diagnosis Date  . Arthritis   . Bronchitis    hx of  . Fatty liver   . Fever blister    Takes Acyclovir  . GERD (gastroesophageal reflux disease)   . Hyperlipemia   . Hypothyroidism   . Migraines   . Osteopenia   . Pneumonia    hx of     YRS AGO  . PONV (postoperative nausea and vomiting)   . Seasonal allergies   . Vertigo     Past Surgical History:  Procedure Laterality Date  . ANTERIOR CERVICAL DECOMP/DISCECTOMY FUSION N/A 02/09/2014   Procedure:  ANTERIOR CERVICAL DECOMPRESSION/DISCECTOMY FUSION 2 LEVELS;  Surgeon: [name], MD;  Location: [place];  Service: Orthopedics;  Laterality: N/A;  C4-5, C5-6 Anterior Cervical Discectomy and Fusion, Allograft, Plate  . BACK SURGERY     lumbar fusion  X2  . CATARACT EXTRACTION W/ INTRAOCULAR LENS  IMPLANT, BILATERAL    . COLONOSCOPY    . GANGLION CYST EXCISION Left 1970s   wrist  . HAND SURGERY  2018  . HIP ARTHROPLASTY Left 2011  . JOINT REPLACEMENT    . KNEE SURGERY Right    3 TOTAL  . ROBOTIC ASSISTED BILATERAL SALPINGO OOPHERECTOMY Bilateral 08/01/2015   Procedure: ROBOTIC ASSISTED BILATERAL SALPINGO OOPHORECTOMY;  Surgeon: Everitt Amber, MD;  Location: [facility];  Service: Gynecology;  Laterality: Bilateral;  . SHOULDER OPEN ROTATOR CUFF REPAIR Right    3 TOTAL  . SHOULDER SURGERY Right   . TOTAL HIP REVISION Left 06/10/2016   Procedure: TOTAL HIP REVISION ARTHROPLASTY;  Surgeon: Rod Can, MD;  Location: [facility];  Service: Orthopedics;  Laterality: Left;    MEDICATIONS: . acyclovir (ZOVIRAX) 400 MG tablet  . alendronate (FOSAMAX) 70 MG tablet  . Artificial Tear Solution (SOOTHE XP) SOLN  . Calcium Carb-Cholecalciferol (CALCIUM 600 + D PO)  . Cholecalciferol (VITAMIN D3) 2000 units TABS  . ciprofloxacin (CIPRO) 500 MG tablet  . clobetasol cream (TEMOVATE) 0.05 %  . Evening Primrose Oil 1000 MG CAPS  . HYDROcodone-acetaminophen (NORCO) 7.5-325 MG tablet  . ibuprofen (ADVIL,MOTRIN) 200 MG tablet  . levothyroxine (SYNTHROID, LEVOTHROID) 100 MCG tablet  . lidocaine (ASPERCREME W/LIDOCAINE) 4 % cream  . meclizine (ANTIVERT) 25 MG tablet  . nystatin-triamcinolone (MYCOLOG II) cream  . Omega-3 Fatty Acids (OMEGA-3 FISH OIL PO)  . omeprazole (PRILOSEC) 20 MG capsule  . ondansetron (ZOFRAN ODT) 4 MG disintegrating tablet  . OVER THE COUNTER MEDICATION  . phentermine 37.5 MG capsule  . simvastatin (ZOCOR) 40 MG tablet  . Specialty Vitamins Products (BIOTIN PLUS KERATIN) 10000-100  MCG-MG TABS  . TURMERIC CURCUMIN PO   No current facility-administered medications for this encounter.   L[employer] supplement. Patient instructed to hold phentermine at least 1 week before surgery.  George Hugh [facility] Short Stay Center/Anesthesiology Phone [phone] 01/12/2018 3:02 PM

## 2018-01-12 NOTE — Telephone Encounter (Signed)
Please advise 

## 2018-01-13 NOTE — Telephone Encounter (Signed)
I called. She can come in to get it checked before surgery . ? Today or tomorrow.

## 2018-01-13 NOTE — Telephone Encounter (Signed)
noted 

## 2018-01-13 NOTE — Telephone Encounter (Signed)
Emma Lewis can see in chart pt got ESI by [facility] recently, got Tx with Cipro 4 days ago and taking last dose today.

## 2018-01-14 NOTE — Telephone Encounter (Signed)
Noted. Will wait for patient to call and will work her into schedule.

## 2018-01-15 ENCOUNTER — Telehealth (INDEPENDENT_AMBULATORY_CARE_PROVIDER_SITE_OTHER): Payer: Self-pay | Admitting: Orthopaedic Surgery

## 2018-01-15 ENCOUNTER — Encounter (INDEPENDENT_AMBULATORY_CARE_PROVIDER_SITE_OTHER): Payer: Self-pay | Admitting: Surgery

## 2018-01-15 ENCOUNTER — Ambulatory Visit (INDEPENDENT_AMBULATORY_CARE_PROVIDER_SITE_OTHER): Payer: Medicare Other | Admitting: Surgery

## 2018-01-15 VITALS — BP 118/67 | HR 93 | Ht 65.0 in | Wt 193.0 lb

## 2018-01-15 DIAGNOSIS — M7062 Trochanteric bursitis, left hip: Secondary | ICD-10-CM | POA: Diagnosis not present

## 2018-01-15 MED ORDER — LIDOCAINE HCL 1 % IJ SOLN
3.0000 mL | INTRAMUSCULAR | Status: AC | PRN
  Administered 2018-01-15: 3 mL

## 2018-01-15 MED ORDER — METHYLPREDNISOLONE ACETATE 40 MG/ML IJ SUSP
80.0000 mg | INTRAMUSCULAR | Status: AC | PRN
  Administered 2018-01-15: 80 mg

## 2018-01-15 MED ORDER — BUPIVACAINE HCL 0.25 % IJ SOLN
6.0000 mL | INTRAMUSCULAR | Status: AC | PRN
  Administered 2018-01-15: 6 mL via INTRA_ARTICULAR

## 2018-01-15 NOTE — Progress Notes (Signed)
Office Visit Note   Patient: Emma Lewis           Date of Birth: Apr 01, 1951           MRN: [id number] Visit Date: 01/15/2018              Requested by: Philmore Pali, NP [address] [place], [place] [address] PCP: Philmore Pali, NP   Assessment & Plan: Visit Diagnoses:  1. Trochanteric bursitis, left hip     Plan: In hopes of giving patient some relief of her lateral hip pain offered injection.  After patient consent lateral hip was prepped with Betadine and Marcaine/Depo-Medrol greater trochanter bursa injection was performed.  We will proceed with right total hip replacement as scheduled.  Patient advised that she is also describing a lumbar radicular component to her pain and she may need this further worked up in the future.  Follow-Up Instructions: Return if symptoms worsen or fail to improve.   Orders:  No orders of the defined types were placed in this encounter.  No orders of the defined types were placed in this encounter.     Procedures: Large Joint Inj: L greater trochanter on 01/15/2018 2:59 PM Indications: pain Details: 22 G 3.5 in needle, lateral approach Medications: 3 mL lidocaine 1 %; 6 mL bupivacaine 0.25 %; 80 mg methylPREDNISolone acetate 40 MG/ML Consent was given by the patient. Patient was prepped and draped in the usual sterile fashion.       Clinical Data: No additional findings.   Subjective: Chief Complaint  Patient presents with  . Left Hip - Pain    HPI Patient comes in today with complaints of left lateral hip pain.  She is scheduled for right total hip replacement by Dr. Lorin Mercy next week.  Pain in left lateral hip when she is ambulate and laying on her left side.  She also describes having left buttock pain that radiates down to her left ankle.  She has had left total hip replacement by Dr. Lyla Glassing in the past.  Dr. Lorin Mercy advised patient to come in today for left hip evaluation before her surgery next week. Review of Systems No current  cardiac pulmonary GI GU issues  Objective: Vital Signs: BP 118/67   Pulse 93   Ht 5\' 5"  (1.651 m)   Wt 193 lb (87.5 kg)   BMI 32.12 kg/m   Physical Exam  Constitutional: She is oriented to person, place, and time. No distress.  HENT:  Head: Normocephalic and atraumatic.  Eyes: Pupils are equal, round, and reactive to light. EOM are normal.  Abdominal: She exhibits no distension.  Musculoskeletal:  Gait is antalgic.  Patient has moderate to marked tenderness over the left sciatic notch and left hip greater trochanter bursa.  She is also tender down the IT band on the left.  Negative logroll.  Negative straight leg raise.  Neurological: She is alert and oriented to person, place, and time.  Skin: Skin is warm and dry.    Ortho Exam  Specialty Comments:  No specialty comments available.  Imaging: No results found.   PMFS History: Patient Active Problem List   Diagnosis Date Noted  . Hx of total hip arthroplasty, left 11/12/2017  . Bilateral primary osteoarthritis of knee 11/12/2017  . Unilateral primary osteoarthritis, right hip 11/12/2017  . GERD (gastroesophageal reflux disease) 01/17/2017  . Hypothyroidism 01/17/2017  . Vertigo 01/16/2017  . Nausea and vomiting 01/16/2017  . Essential hypertension 01/16/2017  .  Fatty liver 01/16/2017  . Failed total hip arthroplasty ([place]) 06/10/2016  . Pelvic mass 06/15/2015  . Closed wedge compression fracture of fifth lumbar vertebra ([place])   . Cervical spondylosis 02/09/2014  . DDD (degenerative disc disease), lumbar 05/13/2013  . OA (osteoarthritis) 05/13/2013   Past Medical History:  Diagnosis Date  . Arthritis   . Bronchitis    hx of  . Fatty liver   . Fever blister    Takes Acyclovir  . GERD (gastroesophageal reflux disease)   . Hyperlipemia   . Hypothyroidism   . Migraines   . Osteopenia   . Pneumonia    hx of     YRS AGO  . PONV (postoperative nausea and vomiting)   . Seasonal allergies   . Vertigo       Family History  Problem Relation Age of Onset  . Lymphoma Mother     Past Surgical History:  Procedure Laterality Date  . ANTERIOR CERVICAL DECOMP/DISCECTOMY FUSION N/A 02/09/2014   Procedure: ANTERIOR CERVICAL DECOMPRESSION/DISCECTOMY FUSION 2 LEVELS;  Surgeon: [name], MD;  Location: [facility];  Service: Orthopedics;  Laterality: N/A;  C4-5, C5-6 Anterior Cervical Discectomy and Fusion, Allograft, Plate  . BACK SURGERY     lumbar fusion  X2  . CATARACT EXTRACTION W/ INTRAOCULAR LENS  IMPLANT, BILATERAL    . COLONOSCOPY    . GANGLION CYST EXCISION Left 1970s   wrist  . HAND SURGERY  2018  . HIP ARTHROPLASTY Left 2011  . JOINT REPLACEMENT    . KNEE SURGERY Right    3 TOTAL  . ROBOTIC ASSISTED BILATERAL SALPINGO OOPHERECTOMY Bilateral 08/01/2015   Procedure: ROBOTIC ASSISTED BILATERAL SALPINGO OOPHORECTOMY;  Surgeon: Everitt Amber, MD;  [facility];  Service: Gynecology;  Laterality: Bilateral;  . SHOULDER OPEN ROTATOR CUFF REPAIR Right    3 TOTAL  . SHOULDER SURGERY Right   . TOTAL HIP REVISION Left 06/10/2016   Procedure: TOTAL HIP REVISION ARTHROPLASTY;  Surgeon: Rod Can, MD;  Location: [facility];  Service: Orthopedics;  Laterality: Left;   Social History   Occupational History  . Not on file  Tobacco Use  . Smoking status: Never Smoker  . Smokeless tobacco: Never Used  Substance and Sexual Activity  . Alcohol use: Yes    Comment: rare  . Drug use: No  . Sexual activity: Not Currently

## 2018-01-15 NOTE — Telephone Encounter (Signed)
ERROR, APPT MADE

## 2018-01-16 ENCOUNTER — Other Ambulatory Visit: Payer: Self-pay | Admitting: Orthopaedic Surgery

## 2018-01-16 NOTE — Care Plan (Unsigned)
Spoke with patient. She plans to discharge to home with family and HHPT as follows. She has equipment at home.   [place] - spoke with Helene Kelp at [phone].  Need to fax hospital information to her at discharge  [phone].   OPPT - will assess for need at post op visit. Prefers [facility] if needed   MD follow up - Dr Lorin Mercy - 02/03/18 @ 845.   Please contact Ladell Heads, [place] with questions or if this plan needs to change   Thanks

## 2018-01-20 MED ORDER — CEFAZOLIN SODIUM-DEXTROSE 2-4 GM/100ML-% IV SOLN
2.0000 g | INTRAVENOUS | Status: AC
  Administered 2018-01-21: 2 g via INTRAVENOUS
  Filled 2018-01-20: qty 100

## 2018-01-21 ENCOUNTER — Inpatient Hospital Stay (HOSPITAL_COMMUNITY): Payer: Medicare Other

## 2018-01-21 ENCOUNTER — Inpatient Hospital Stay (HOSPITAL_COMMUNITY): Payer: Medicare Other | Admitting: Vascular Surgery

## 2018-01-21 ENCOUNTER — Inpatient Hospital Stay (HOSPITAL_COMMUNITY): Payer: Medicare Other | Admitting: Certified Registered Nurse Anesthetist

## 2018-01-21 ENCOUNTER — Inpatient Hospital Stay (HOSPITAL_COMMUNITY)
Admission: RE | Admit: 2018-01-21 | Discharge: 2018-01-23 | DRG: 470 | Disposition: A | Discharge status: Home Health | Payer: Medicare Other | Attending: Orthopaedic Surgery | Admitting: Orthopaedic Surgery

## 2018-01-21 ENCOUNTER — Encounter (HOSPITAL_COMMUNITY): Payer: Self-pay | Admitting: *Deleted

## 2018-01-21 ENCOUNTER — Encounter (HOSPITAL_COMMUNITY)
Admission: RE | Disposition: A | Discharge status: Home Health | Payer: Self-pay | Source: Home / Self Care | Attending: Orthopaedic Surgery

## 2018-01-21 ENCOUNTER — Other Ambulatory Visit: Payer: Self-pay

## 2018-01-21 DIAGNOSIS — Z96641 Presence of right artificial hip joint: Secondary | ICD-10-CM | POA: Diagnosis not present

## 2018-01-21 DIAGNOSIS — B001 Herpesviral vesicular dermatitis: Secondary | ICD-10-CM | POA: Diagnosis present

## 2018-01-21 DIAGNOSIS — Z9841 Cataract extraction status, right eye: Secondary | ICD-10-CM | POA: Diagnosis not present

## 2018-01-21 DIAGNOSIS — Z79890 Hormone replacement therapy (postmenopausal): Secondary | ICD-10-CM

## 2018-01-21 DIAGNOSIS — M8580 Other specified disorders of bone density and structure, unspecified site: Secondary | ICD-10-CM | POA: Diagnosis present

## 2018-01-21 DIAGNOSIS — M1611 Unilateral primary osteoarthritis, right hip: Principal | ICD-10-CM | POA: Diagnosis present

## 2018-01-21 DIAGNOSIS — Z9842 Cataract extraction status, left eye: Secondary | ICD-10-CM

## 2018-01-21 DIAGNOSIS — Z791 Long term (current) use of non-steroidal anti-inflammatories (NSAID): Secondary | ICD-10-CM | POA: Diagnosis not present

## 2018-01-21 DIAGNOSIS — Z90722 Acquired absence of ovaries, bilateral: Secondary | ICD-10-CM | POA: Diagnosis not present

## 2018-01-21 DIAGNOSIS — J302 Other seasonal allergic rhinitis: Secondary | ICD-10-CM | POA: Diagnosis present

## 2018-01-21 DIAGNOSIS — K219 Gastro-esophageal reflux disease without esophagitis: Secondary | ICD-10-CM | POA: Diagnosis present

## 2018-01-21 DIAGNOSIS — Z981 Arthrodesis status: Secondary | ICD-10-CM

## 2018-01-21 DIAGNOSIS — Z882 Allergy status to sulfonamides status: Secondary | ICD-10-CM

## 2018-01-21 DIAGNOSIS — I10 Essential (primary) hypertension: Secondary | ICD-10-CM | POA: Diagnosis present

## 2018-01-21 DIAGNOSIS — E039 Hypothyroidism, unspecified: Secondary | ICD-10-CM | POA: Diagnosis present

## 2018-01-21 DIAGNOSIS — Z79891 Long term (current) use of opiate analgesic: Secondary | ICD-10-CM

## 2018-01-21 DIAGNOSIS — K760 Fatty (change of) liver, not elsewhere classified: Secondary | ICD-10-CM | POA: Diagnosis present

## 2018-01-21 DIAGNOSIS — Z961 Presence of intraocular lens: Secondary | ICD-10-CM | POA: Diagnosis present

## 2018-01-21 DIAGNOSIS — Z7983 Long term (current) use of bisphosphonates: Secondary | ICD-10-CM

## 2018-01-21 DIAGNOSIS — Z888 Allergy status to other drugs, medicaments and biological substances status: Secondary | ICD-10-CM | POA: Diagnosis not present

## 2018-01-21 DIAGNOSIS — E785 Hyperlipidemia, unspecified: Secondary | ICD-10-CM | POA: Diagnosis present

## 2018-01-21 DIAGNOSIS — Z471 Aftercare following joint replacement surgery: Secondary | ICD-10-CM | POA: Diagnosis not present

## 2018-01-21 DIAGNOSIS — Z96642 Presence of left artificial hip joint: Secondary | ICD-10-CM | POA: Diagnosis present

## 2018-01-21 DIAGNOSIS — Z419 Encounter for procedure for purposes other than remedying health state, unspecified: Secondary | ICD-10-CM

## 2018-01-21 DIAGNOSIS — M170 Bilateral primary osteoarthritis of knee: Secondary | ICD-10-CM | POA: Diagnosis present

## 2018-01-21 DIAGNOSIS — Z79899 Other long term (current) drug therapy: Secondary | ICD-10-CM | POA: Diagnosis not present

## 2018-01-21 DIAGNOSIS — Z807 Family history of other malignant neoplasms of lymphoid, hematopoietic and related tissues: Secondary | ICD-10-CM | POA: Diagnosis not present

## 2018-01-21 DIAGNOSIS — Z09 Encounter for follow-up examination after completed treatment for conditions other than malignant neoplasm: Secondary | ICD-10-CM

## 2018-01-21 HISTORY — PX: TOTAL HIP ARTHROPLASTY: SHX124

## 2018-01-21 SURGERY — ARTHROPLASTY, HIP, TOTAL, ANTERIOR APPROACH
Anesthesia: Monitor Anesthesia Care | Site: Hip | Laterality: Right

## 2018-01-21 MED ORDER — OXYCODONE HCL 5 MG PO TABS
5.0000 mg | ORAL_TABLET | Freq: Four times a day (QID) | ORAL | Status: DC | PRN
  Administered 2018-01-21 – 2018-01-23 (×7): 5 mg via ORAL
  Filled 2018-01-21 (×7): qty 1

## 2018-01-21 MED ORDER — BUPIVACAINE-EPINEPHRINE (PF) 0.25% -1:200000 IJ SOLN
INTRAMUSCULAR | Status: AC
  Filled 2018-01-21: qty 30

## 2018-01-21 MED ORDER — FENTANYL CITRATE (PF) 100 MCG/2ML IJ SOLN: 25.0000 ug | INTRAMUSCULAR | Status: DC | PRN

## 2018-01-21 MED ORDER — CHLORHEXIDINE GLUCONATE 4 % EX LIQD: 60.0000 mL | Freq: Once | CUTANEOUS | Status: DC

## 2018-01-21 MED ORDER — HYDROMORPHONE HCL 1 MG/ML IJ SOLN: 0.2500 mg | INTRAMUSCULAR | Status: DC | PRN

## 2018-01-21 MED ORDER — PHENYLEPHRINE 40 MCG/ML (10ML) SYRINGE FOR IV PUSH (FOR BLOOD PRESSURE SUPPORT)
PREFILLED_SYRINGE | INTRAVENOUS | Status: AC
  Filled 2018-01-21: qty 20

## 2018-01-21 MED ORDER — MEPERIDINE HCL 50 MG/ML IJ SOLN: 6.2500 mg | INTRAMUSCULAR | Status: DC | PRN

## 2018-01-21 MED ORDER — ROCURONIUM BROMIDE 50 MG/5ML IV SOLN
INTRAVENOUS | Status: AC
  Filled 2018-01-21: qty 1

## 2018-01-21 MED ORDER — PHENYLEPHRINE HCL 10 MG/ML IJ SOLN
INTRAMUSCULAR | Status: DC | PRN
  Administered 2018-01-21: 80 ug via INTRAVENOUS
  Administered 2018-01-21 (×2): 120 ug via INTRAVENOUS

## 2018-01-21 MED ORDER — PROPOFOL 10 MG/ML IV BOLUS
INTRAVENOUS | Status: DC | PRN
  Administered 2018-01-21 (×2): 15 mg via INTRAVENOUS

## 2018-01-21 MED ORDER — MECLIZINE HCL 25 MG PO TABS
25.0000 mg | ORAL_TABLET | Freq: Every morning | ORAL | Status: DC
  Administered 2018-01-22 – 2018-01-23 (×2): 25 mg via ORAL
  Filled 2018-01-21 (×2): qty 1

## 2018-01-21 MED ORDER — PANTOPRAZOLE SODIUM 40 MG PO TBEC
40.0000 mg | DELAYED_RELEASE_TABLET | Freq: Every day | ORAL | Status: DC
Start: 2018-01-22 — End: 2018-01-23
  Administered 2018-01-22 – 2018-01-23 (×2): 40 mg via ORAL
  Filled 2018-01-21 (×2): qty 1

## 2018-01-21 MED ORDER — METHOCARBAMOL 1000 MG/10ML IJ SOLN
500.0000 mg | Freq: Four times a day (QID) | INTRAVENOUS | Status: DC | PRN
  Filled 2018-01-21: qty 5

## 2018-01-21 MED ORDER — METOCLOPRAMIDE HCL 5 MG/ML IJ SOLN: 10.0000 mg | Freq: Once | INTRAMUSCULAR | Status: DC | PRN

## 2018-01-21 MED ORDER — DOCUSATE SODIUM 100 MG PO CAPS
100.0000 mg | ORAL_CAPSULE | Freq: Two times a day (BID) | ORAL | Status: DC
  Administered 2018-01-21 – 2018-01-23 (×4): 100 mg via ORAL
  Filled 2018-01-21 (×4): qty 1

## 2018-01-21 MED ORDER — ONDANSETRON HCL 4 MG/2ML IJ SOLN
INTRAMUSCULAR | Status: AC
  Filled 2018-01-21: qty 2

## 2018-01-21 MED ORDER — POLYVINYL ALCOHOL 1.4 % OP SOLN: 1.0000 [drp] | Freq: Every day | OPHTHALMIC | Status: DC | PRN

## 2018-01-21 MED ORDER — NYSTATIN-TRIAMCINOLONE 100000-0.1 UNIT/GM-% EX CREA
1.0000 "application " | TOPICAL_CREAM | Freq: Three times a day (TID) | CUTANEOUS | Status: DC
  Filled 2018-01-21: qty 15
  Filled 2018-01-21: qty 30

## 2018-01-21 MED ORDER — PROMETHAZINE HCL 25 MG/ML IJ SOLN: 6.2500 mg | INTRAMUSCULAR | Status: DC | PRN

## 2018-01-21 MED ORDER — HYDROMORPHONE HCL 2 MG/ML IJ SOLN
0.5000 mg | INTRAMUSCULAR | Status: DC | PRN
  Administered 2018-01-21 – 2018-01-22 (×3): 0.5 mg via INTRAVENOUS
  Filled 2018-01-21 (×3): qty 1

## 2018-01-21 MED ORDER — PHENOL 1.4 % MT LIQD: 1.0000 | OROMUCOSAL | Status: DC | PRN

## 2018-01-21 MED ORDER — MENTHOL 3 MG MT LOZG: 1.0000 | LOZENGE | OROMUCOSAL | Status: DC | PRN

## 2018-01-21 MED ORDER — SODIUM CHLORIDE 0.9 % IV SOLN
INTRAVENOUS | Status: DC
  Administered 2018-01-21: 18:00:00 via INTRAVENOUS

## 2018-01-21 MED ORDER — PROPOFOL 500 MG/50ML IV EMUL
INTRAVENOUS | Status: DC | PRN
  Administered 2018-01-21: 70 ug/kg/min via INTRAVENOUS

## 2018-01-21 MED ORDER — METOCLOPRAMIDE HCL 5 MG PO TABS: 5.0000 mg | ORAL_TABLET | Freq: Three times a day (TID) | ORAL | Status: DC | PRN

## 2018-01-21 MED ORDER — ONDANSETRON HCL 4 MG/2ML IJ SOLN
4.0000 mg | Freq: Four times a day (QID) | INTRAMUSCULAR | Status: DC | PRN
  Administered 2018-01-21: 4 mg via INTRAVENOUS
  Filled 2018-01-21: qty 2

## 2018-01-21 MED ORDER — METHOCARBAMOL 500 MG PO TABS
500.0000 mg | ORAL_TABLET | Freq: Four times a day (QID) | ORAL | Status: DC | PRN
  Administered 2018-01-21 – 2018-01-23 (×7): 500 mg via ORAL
  Filled 2018-01-21 (×7): qty 1

## 2018-01-21 MED ORDER — PROPOFOL 1000 MG/100ML IV EMUL
INTRAVENOUS | Status: AC
  Filled 2018-01-21: qty 100

## 2018-01-21 MED ORDER — VITAMIN D 1000 UNITS PO TABS
2000.0000 [IU] | ORAL_TABLET | Freq: Three times a day (TID) | ORAL | Status: DC
  Administered 2018-01-21 – 2018-01-23 (×5): 2000 [IU] via ORAL
  Filled 2018-01-21 (×8): qty 2

## 2018-01-21 MED ORDER — ONDANSETRON HCL 4 MG/2ML IJ SOLN
INTRAMUSCULAR | Status: DC | PRN
  Administered 2018-01-21: 4 mg via INTRAVENOUS

## 2018-01-21 MED ORDER — ACETAMINOPHEN 325 MG PO TABS
325.0000 mg | ORAL_TABLET | Freq: Four times a day (QID) | ORAL | Status: DC | PRN
  Administered 2018-01-21 – 2018-01-23 (×4): 650 mg via ORAL
  Filled 2018-01-21 (×4): qty 2

## 2018-01-21 MED ORDER — ONDANSETRON HCL 4 MG PO TABS: 4.0000 mg | ORAL_TABLET | Freq: Four times a day (QID) | ORAL | Status: DC | PRN

## 2018-01-21 MED ORDER — BUPIVACAINE IN DEXTROSE 0.75-8.25 % IT SOLN
INTRATHECAL | Status: DC | PRN
  Administered 2018-01-21: 1.5 mL via INTRATHECAL

## 2018-01-21 MED ORDER — PHENYLEPHRINE HCL 10 MG/ML IJ SOLN
INTRAVENOUS | Status: DC | PRN
  Administered 2018-01-21: 25 ug/min via INTRAVENOUS

## 2018-01-21 MED ORDER — MIDAZOLAM HCL 5 MG/5ML IJ SOLN
INTRAMUSCULAR | Status: DC | PRN
  Administered 2018-01-21: 2 mg via INTRAVENOUS

## 2018-01-21 MED ORDER — CLOBETASOL PROPIONATE 0.05 % EX CREA
1.0000 "application " | TOPICAL_CREAM | Freq: Every day | CUTANEOUS | Status: DC
  Administered 2018-01-21 – 2018-01-23 (×3): 1 via TOPICAL
  Filled 2018-01-21: qty 15

## 2018-01-21 MED ORDER — METOCLOPRAMIDE HCL 5 MG/ML IJ SOLN: 5.0000 mg | Freq: Three times a day (TID) | INTRAMUSCULAR | Status: DC | PRN

## 2018-01-21 MED ORDER — SIMVASTATIN 40 MG PO TABS
40.0000 mg | ORAL_TABLET | Freq: Every day | ORAL | Status: DC
  Administered 2018-01-21 – 2018-01-22 (×2): 40 mg via ORAL
  Filled 2018-01-21 (×2): qty 1

## 2018-01-21 MED ORDER — MIDAZOLAM HCL 2 MG/2ML IJ SOLN
INTRAMUSCULAR | Status: AC
  Filled 2018-01-21: qty 2

## 2018-01-21 MED ORDER — LEVOTHYROXINE SODIUM 100 MCG PO TABS
100.0000 ug | ORAL_TABLET | Freq: Every day | ORAL | Status: DC
  Administered 2018-01-22 – 2018-01-23 (×2): 100 ug via ORAL
  Filled 2018-01-21 (×2): qty 1

## 2018-01-21 MED ORDER — DEXAMETHASONE SODIUM PHOSPHATE 10 MG/ML IJ SOLN
INTRAMUSCULAR | Status: AC
  Filled 2018-01-21: qty 1

## 2018-01-21 MED ORDER — CEFAZOLIN SODIUM-DEXTROSE 1-4 GM/50ML-% IV SOLN
1.0000 g | Freq: Three times a day (TID) | INTRAVENOUS | Status: AC
  Administered 2018-01-21 – 2018-01-22 (×2): 1 g via INTRAVENOUS
  Filled 2018-01-21 (×2): qty 50

## 2018-01-21 MED ORDER — LACTATED RINGERS IV SOLN
INTRAVENOUS | Status: DC
  Administered 2018-01-21 (×2): via INTRAVENOUS

## 2018-01-21 MED ORDER — ASPIRIN EC 325 MG PO TBEC
325.0000 mg | DELAYED_RELEASE_TABLET | Freq: Every day | ORAL | Status: DC
  Administered 2018-01-22 – 2018-01-23 (×2): 325 mg via ORAL
  Filled 2018-01-21 (×2): qty 1

## 2018-01-21 MED ORDER — POLYETHYLENE GLYCOL 3350 17 G PO PACK: 17.0000 g | PACK | Freq: Every day | ORAL | Status: DC | PRN

## 2018-01-21 MED ORDER — BUPIVACAINE HCL (PF) 0.25 % IJ SOLN
INTRAMUSCULAR | Status: AC
  Filled 2018-01-21: qty 30

## 2018-01-21 SURGICAL SUPPLY — 51 items
APL SKNCLS STERI-STRIP NONHPOA (GAUZE/BANDAGES/DRESSINGS) ×1
BENZOIN TINCTURE PRP APPL 2/3 (GAUZE/BANDAGES/DRESSINGS) ×3 IMPLANT
BLADE CLIPPER SURG (BLADE) IMPLANT
BLADE SAW SGTL 18X1.27X75 (BLADE) ×2 IMPLANT
BLADE SAW SGTL 18X1.27X75MM (BLADE) ×1
CAPT HIP TOTAL 2 ×2 IMPLANT
CELLS DAT CNTRL 66122 CELL SVR (MISCELLANEOUS) ×1 IMPLANT
CLOSURE WOUND 1/2 X4 (GAUZE/BANDAGES/DRESSINGS) ×1
COVER SURGICAL LIGHT HANDLE (MISCELLANEOUS) ×3 IMPLANT
DRAPE C-ARM 42X72 X-RAY (DRAPES) ×3 IMPLANT
DRAPE IMP U-DRAPE 54X76 (DRAPES) ×3 IMPLANT
DRAPE STERI IOBAN 125X83 (DRAPES) ×3 IMPLANT
DRAPE U-SHAPE 47X51 STRL (DRAPES) ×9 IMPLANT
DRSG MEPILEX BORDER 4X12 (GAUZE/BANDAGES/DRESSINGS) ×2 IMPLANT
DRSG MEPILEX BORDER 4X8 (GAUZE/BANDAGES/DRESSINGS) ×3 IMPLANT
DURAPREP 26ML APPLICATOR (WOUND CARE) ×3 IMPLANT
ELECT BLADE 4.0 EZ CLEAN MEGAD (MISCELLANEOUS)
ELECT CAUTERY BLADE 6.4 (BLADE) ×3 IMPLANT
ELECT REM PT RETURN 9FT ADLT (ELECTROSURGICAL) ×3
ELECTRODE BLDE 4.0 EZ CLN MEGD (MISCELLANEOUS) IMPLANT
ELECTRODE REM PT RTRN 9FT ADLT (ELECTROSURGICAL) ×1 IMPLANT
FACESHIELD WRAPAROUND (MASK) ×6 IMPLANT
FACESHIELD WRAPAROUND OR TEAM (MASK) ×2 IMPLANT
GLOVE BIOGEL PI IND STRL 8 (GLOVE) ×2 IMPLANT
GLOVE BIOGEL PI INDICATOR 8 (GLOVE) ×4
GLOVE ORTHO TXT STRL SZ7.5 (GLOVE) ×6 IMPLANT
GOWN STRL REUS W/ TWL LRG LVL3 (GOWN DISPOSABLE) ×1 IMPLANT
GOWN STRL REUS W/ TWL XL LVL3 (GOWN DISPOSABLE) ×1 IMPLANT
GOWN STRL REUS W/TWL 2XL LVL3 (GOWN DISPOSABLE) ×3 IMPLANT
GOWN STRL REUS W/TWL LRG LVL3 (GOWN DISPOSABLE) ×3
GOWN STRL REUS W/TWL XL LVL3 (GOWN DISPOSABLE) ×3
KIT BASIN OR (CUSTOM PROCEDURE TRAY) ×3 IMPLANT
KIT TURNOVER KIT B (KITS) ×3 IMPLANT
MANIFOLD NEPTUNE II (INSTRUMENTS) ×3 IMPLANT
NS IRRIG 1000ML POUR BTL (IV SOLUTION) ×3 IMPLANT
PACK TOTAL JOINT (CUSTOM PROCEDURE TRAY) ×3 IMPLANT
PAD ARMBOARD 7.5X6 YLW CONV (MISCELLANEOUS) ×6 IMPLANT
RETRACTOR WND ALEXIS 18 MED (MISCELLANEOUS) ×1 IMPLANT
RTRCTR WOUND ALEXIS 18CM MED (MISCELLANEOUS) ×3
STRIP CLOSURE SKIN 1/2X4 (GAUZE/BANDAGES/DRESSINGS) ×2 IMPLANT
SUT VIC AB 0 CT1 27 (SUTURE) ×3
SUT VIC AB 0 CT1 27XBRD ANBCTR (SUTURE) ×1 IMPLANT
SUT VIC AB 2-0 CT1 27 (SUTURE) ×3
SUT VIC AB 2-0 CT1 TAPERPNT 27 (SUTURE) ×1 IMPLANT
SUT VICRYL 4-0 PS2 18IN ABS (SUTURE) ×3 IMPLANT
SUT VLOC 180 0 24IN GS25 (SUTURE) ×3 IMPLANT
TOWEL OR 17X24 6PK STRL BLUE (TOWEL DISPOSABLE) ×3 IMPLANT
TOWEL OR 17X26 10 PK STRL BLUE (TOWEL DISPOSABLE) ×6 IMPLANT
TRAY CATH 16FR W/PLASTIC CATH (SET/KITS/TRAYS/PACK) IMPLANT
TRAY FOLEY MTR SLVR 16FR STAT (SET/KITS/TRAYS/PACK) IMPLANT
WATER STERILE IRR 1000ML POUR (IV SOLUTION) ×6 IMPLANT

## 2018-01-21 NOTE — Anesthesia Procedure Notes (Signed)
Procedure Name: MAC Date/Time: 01/21/2018 12:45 PM Performed by: White, Amedeo Plenty, CRNA Pre-anesthesia Checklist: Patient identified, Emergency Drugs available, Suction available and Patient being monitored Patient Re-evaluated:Patient Re-evaluated prior to induction Oxygen Delivery Method: Simple face mask

## 2018-01-21 NOTE — Progress Notes (Signed)
1650 received pt from PACU, A&O x4. Pleasant and cooperative. Right hip dressing dry and intact. Pt voided.

## 2018-01-21 NOTE — Op Note (Signed)
Preop diagnosis: Primary right hip osteoarthritis  Postoperative diagnosis: Same  Procedure: Right total hip arthroplasty, direct anterior approach  Surgeon: Rodell Perna, MD  Assistant: Benjiman Core, PA-C medically necessary and present for the entire procedure  Anesthesia: Spinal plus Marcaine and Exparel  Implants Depuy Gripton 100 29mmcup, apex hole eliminator, Size 4 Actis standard stem plus 1.5 58mm SROM metal head. Plus 4 neutral liner poly.     Procedure: After induction of spinal anesthesia patient was placed on the Hana table after boots were applied center post careful padding positioning arms on arm boards.  1015 drapes were applied after tape was applied to the abdomen pulling at over.  Preoperative Ancef was given timeout procedure was completed while the DuraPrep was drying.  Sterile U drapes were applied sterile skin marker large shower curtain Betadine Steri-Drape application half sheet above have sheet across.  Direct anterior approach was made starting 2 cm inferior 1 cm lateral to the ASIS obliquely to the trochanter.  Skin protector was placed, fascia was nicked extended with scissors grasped with an Allis clamp finger dissection and dull cobra over the top of the capsule.  Transverse arteries were identified coagulated divided.  Some anterior fat was removed and retractor was placed inside the capsule once anterior capsule was resected.  Cobra placed above and below the neck and oscillating saw placed on the neck adjusted with the C arm for appropriate cut and neck was divided completing the superior aspect with an osteotome.  Head was removed with a corkscrew there was primary osteoarthritis with erosion of the cartilage consistent with her x-rays.  Sequential reaming up to 51 for 52 mm socket was performed.  There was good rim fit anterior-posterior and a Gripton single central hole cup was selected and impacted into place under C arm secured was tight in the apex hole eliminator  was added.  Poly-was dialed in impacted checked with the [facility] and was secure.  Hydraulic hook was sucked underneath the femur carefully directly on bone leg was taken down and under posterior capsule was divided and Mueller was placed medially and large trochanteric retractor placed over the greater trochanter carefully.  Rondure cookie-cutter chili pepper and then sequential broaching was performed.  When a size 3 was present it was checked under C arm and stem was down the canal but a larger size could be selected and a size 4 was placed which gave excellent tight fit.  Permanent stem was inserted and trialing off of this showed a 1.5 gave good stability with trace shock restoration of leg lengths comparing the 2 trochanters on AP fluoroscopic view.  Hip was stable bringing extension down to 45 degrees external rotation to 90 degrees with good hip stability.  Permanent ball was then inserted taking the leg down under again and then repeat final spot fluoroscopic pictures AP and frog-leg lateral as well as AP x-ray showing leg lengths with symmetrical obturator foramen and visualization of both lesser trochanters with both hips and external rotation of 45 degrees to maximize visualization of the trochanters.  Copious irrigation repeat coagulation of the bleeding vessels V-lock  closure of the fascia 2-0 Vicryl subcutaneous tissue skin closure postop dressing and transferred to the recovery room.

## 2018-01-21 NOTE — Anesthesia Preprocedure Evaluation (Addendum)
Anesthesia Evaluation  Patient identified by MRN, date of birth, ID band Patient awake    Reviewed: Allergy & Precautions, NPO status , Patient's Chart, lab work & pertinent test results  History of Anesthesia Complications (+) PONV  Airway Mallampati: II  TM Distance: >3 FB Neck ROM: Full    Dental no notable dental hx.    Pulmonary neg pulmonary ROS,    Pulmonary exam normal breath sounds clear to auscultation       Cardiovascular hypertension, Normal cardiovascular exam Rhythm:Regular Rate:Normal     Neuro/Psych negative neurological ROS  negative psych ROS   GI/Hepatic negative GI ROS, Neg liver ROS,   Endo/Other  Hypothyroidism   Renal/GU negative Renal ROS  negative genitourinary   Musculoskeletal negative musculoskeletal ROS (+)   Abdominal   Peds negative pediatric ROS (+)  Hematology negative hematology ROS (+)   Anesthesia Other Findings   Reproductive/Obstetrics negative OB ROS                                                              Anesthesia Evaluation  Patient identified by MRN, date of birth, ID band Patient awake    Reviewed: Allergy & Precautions, NPO status , Patient's Chart, lab work & pertinent test results  History of Anesthesia Complications (+) PONV and history of anesthetic complications  Airway Mallampati: III  TM Distance: <3 FB Neck ROM: Full    Dental  (+) Teeth Intact   Pulmonary neg shortness of breath, neg sleep apnea, neg COPD, neg recent URI, neg PE   breath sounds clear to auscultation       Cardiovascular negative cardio ROS   Rhythm:Regular     Neuro/Psych negative neurological ROS  negative psych ROS   GI/Hepatic Neg liver ROS, GERD  Medicated and Controlled,  Endo/Other  Hypothyroidism Morbid obesity  Renal/GU negative Renal ROS     Musculoskeletal  (+) Arthritis ,   Abdominal   Peds   Hematology negative hematology ROS (+)   Anesthesia Other Findings Previous acdf  Reproductive/Obstetrics                             Anesthesia Physical Anesthesia Plan  ASA: II  Anesthesia Plan: Spinal   Post-op Pain Management:    Induction:   Airway Management Planned: Natural Airway, Nasal Cannula and Simple Face Mask  Additional Equipment: None  Intra-op Plan:   Post-operative Plan:   Informed Consent: I have reviewed the patients History and Physical, chart, labs and discussed the procedure including the risks, benefits and alternatives for the proposed anesthesia with the patient or authorized representative who has indicated his/her understanding and acceptance.   Dental advisory given  Plan Discussed with: CRNA and Surgeon  Anesthesia Plan Comments:         Anesthesia Quick Evaluation  Anesthesia Physical Anesthesia Plan  ASA: II  Anesthesia Plan: Spinal   Post-op Pain Management:    Induction: Intravenous  PONV Risk Score and Plan: 3 and Ondansetron, Dexamethasone and Treatment may vary due to age or medical condition  Airway Management Planned: Simple Face Mask  Additional Equipment:   Intra-op Plan:   Post-operative Plan:   Informed Consent: I have reviewed the patients History and Physical, chart, labs  and discussed the procedure including the risks, benefits and alternatives for the proposed anesthesia with the patient or authorized representative who has indicated his/her understanding and acceptance.   Dental advisory given  Plan Discussed with: CRNA and Surgeon  Anesthesia Plan Comments:         Anesthesia Quick Evaluation

## 2018-01-21 NOTE — Interval H&P Note (Signed)
History and Physical Interval Note:  01/21/2018 12:25 PM  Emma Lewis  has presented today for surgery, with the diagnosis of right hip osteoarthritis  The various methods of treatment have been discussed with the patient and family. After consideration of risks, benefits and other options for treatment, the patient has consented to  Procedure(s): RIGHT TOTAL HIP ARTHROPLASTY DIRECT ANTERIOR (Right) as a surgical intervention .  The patient's history has been reviewed, patient examined, no change in status, stable for surgery.  I have reviewed the patient's chart and labs.  Questions were answered to the patient's satisfaction.     [name]

## 2018-01-21 NOTE — Progress Notes (Signed)
Report given to philip rn as caergiver

## 2018-01-21 NOTE — Transfer of Care (Signed)
Immediate Anesthesia Transfer of Care Note  Patient: Emma Lewis  Procedure(s) Performed: RIGHT TOTAL HIP ARTHROPLASTY DIRECT ANTERIOR (Right Hip)  Patient Location: PACU  Anesthesia Type:MAC and Spinal  Level of Consciousness: awake, alert  and oriented  Airway & Oxygen Therapy: Patient Spontanous Breathing  Post-op Assessment: Report given to RN  Post vital signs: Reviewed and stable  Last Vitals:  Vitals Value Taken Time  BP    Temp    Pulse 76 01/21/2018  2:53 PM  Resp 15 01/21/2018  2:53 PM  SpO2 97 % 01/21/2018  2:53 PM  Vitals shown include unvalidated device data.  Last Pain:  Vitals:   01/21/18 1045  TempSrc:   PainSc: 0-No pain         Complications: No apparent anesthesia complications

## 2018-01-21 NOTE — Anesthesia Procedure Notes (Signed)
Spinal  Patient location during procedure: OR Staffing Anesthesiologist: Montez Hageman, MD Performed: anesthesiologist  Preanesthetic Checklist Completed: patient identified, site marked, surgical consent, pre-op evaluation, timeout performed, IV checked, risks and benefits discussed and monitors and equipment checked Spinal Block Patient position: sitting Prep: Betadine Patient monitoring: heart rate, continuous pulse ox and blood pressure Approach: right paramedian Location: L3-4 Injection technique: single-shot Needle Needle type: Sprotte  Needle gauge: 24 G Needle length: 9 cm Additional Notes Expiration date of kit checked and confirmed. Patient tolerated procedure well, without complications.

## 2018-01-22 ENCOUNTER — Encounter (HOSPITAL_COMMUNITY): Payer: Self-pay | Admitting: Orthopaedic Surgery

## 2018-01-22 LAB — CBC
HEMATOCRIT: 35.7 % — AB (ref 36.0–46.0)
Hemoglobin: 11.4 g/dL — ABNORMAL LOW (ref 12.0–15.0)
MCH: 30.2 pg (ref 26.0–34.0)
MCHC: 31.9 g/dL (ref 30.0–36.0)
MCV: 94.4 fL (ref 78.0–100.0)
Platelets: 317 10*3/uL (ref 150–400)
RBC: 3.78 MIL/uL — ABNORMAL LOW (ref 3.87–5.11)
RDW: 14.1 % (ref 11.5–15.5)
WBC: 8.5 10*3/uL (ref 4.0–10.5)

## 2018-01-22 LAB — BASIC METABOLIC PANEL
Anion gap: 5 (ref 5–15)
BUN: 13 mg/dL (ref 8–23)
CO2: 26 mmol/L (ref 22–32)
CREATININE: 0.71 mg/dL (ref 0.44–1.00)
Calcium: 8.4 mg/dL — ABNORMAL LOW (ref 8.9–10.3)
Chloride: 105 mmol/L (ref 98–111)
GFR calc Af Amer: >60 mL/min (ref >60)
GFR calc non Af Amer: >60 mL/min (ref >60)
GLUCOSE: 118 mg/dL — AB (ref 70–99)
Potassium: 4.3 mmol/L (ref 3.5–5.1)
Sodium: 136 mmol/L (ref 135–145)

## 2018-01-22 NOTE — Care Management Note (Signed)
Case Management Note  Patient Details  Name: LIORA MYLES MRN: [id number] Date of Birth: 1951/02/13  Subjective/Objective:     Pt is s/p total hip replacement               Action/Plan:   PTA independent from home with husband.  Pt has PCP and denied barriers with obtaining/paying for medications.  Pt interested in [facility] for PT.  CM requested HHPT order and face to face from attending.  Once orders are received CM will fax to agency for referral   Expected Discharge Date:                  Expected Discharge Plan:     In-House Referral:  Clinical Social Work  Discharge planning Services  CM Consult  Post Acute Care Choice:    Choice offered to:     DME Arranged:    DME Agency:     HH Arranged:    [place] Agency:     Status of Service:     If discussed at [employer], dates discussed:    Additional Comments:  Maryclare Labrador, RN 01/22/2018, 3:02 PM

## 2018-01-22 NOTE — Progress Notes (Signed)
Physical Therapy Treatment Patient Details Name: Emma Lewis MRN: [id number] DOB: [birth year] Today's Date: 01/22/2018    History of Present Illness Pt is a 67 y/o female s/p R THA. PMH including but not limited to HTN, L THA in 2011 with revision in 2017.    PT Comments    Pt making steady progress with functional mobility. She was able to tolerate ambulating a further distance but continues to demonstrate a very slow, cautious gait pattern. Will plan for stair training at next session prior to d/c home. Pt would continue to benefit from skilled physical therapy services at this time while admitted and after d/c to address the below listed limitations in order to improve overall safety and independence with functional mobility.    Follow Up Recommendations  Home health PT     Equipment Recommendations  None recommended by PT    Recommendations for Other Services       Precautions / Restrictions Precautions Precautions: Fall Restrictions Weight Bearing Restrictions: Yes RLE Weight Bearing: Weight bearing as tolerated    Mobility  Bed Mobility Overal bed mobility: Needs Assistance Bed Mobility: Sit to Supine       Sit to supine: Min assist   General bed mobility comments: increased time and effort, assistance with R LE movement back onto bed  Transfers Overall transfer level: Needs assistance Equipment used: Rolling walker (2 wheeled) Transfers: Sit to/from Stand Sit to Stand: Min guard         General transfer comment: increased time and effort, good technique, min guard for safety with transition movement into standing from recliner chair x1 and from toilet x1  Ambulation/Gait Ambulation/Gait assistance: Min guard Gait Distance (Feet): 100 Feet Assistive device: Rolling walker (2 wheeled) Gait Pattern/deviations: Step-to pattern;Step-through pattern;Decreased step length - left;Decreased stance time - right;Decreased stride length;Decreased weight shift to  right Gait velocity: decreased Gait velocity interpretation: <1.31 ft/sec, indicative of household ambulator General Gait Details: pt with very slow, cautious but steady gait with RW, requiring several standing rest breaks secondary to pain   Stairs             Wheelchair Mobility    Modified Rankin (Stroke Patients Only)       Balance Overall balance assessment: Needs assistance Sitting-balance support: Feet supported Sitting balance-Leahy Scale: Good     Standing balance support: During functional activity;Bilateral upper extremity supported Standing balance-Leahy Scale: Poor                              Cognition Arousal/Alertness: Awake/alert Behavior During Therapy: WFL for tasks assessed/performed Overall Cognitive Status: Within Functional Limits for tasks assessed                                        Exercises      General Comments        Pertinent Vitals/Pain Pain Assessment: Faces Faces Pain Scale: Hurts even more Pain Location: R hip Pain Descriptors / Indicators: Burning Pain Intervention(s): Monitored during session;Repositioned    Home Living                      Prior Function            PT Goals (current goals can now be found in the care plan section) Acute Rehab PT Goals PT Goal Formulation:  With patient Time For Goal Achievement: 02/05/18 Potential to Achieve Goals: Good Progress towards PT goals: Progressing toward goals    Frequency    7X/week      PT Plan Current plan remains appropriate    Co-evaluation              AM-PAC PT "6 Clicks" Daily Activity  Outcome Measure  Difficulty turning over in bed (including adjusting bedclothes, sheets and blankets)?: Unable Difficulty moving from lying on back to sitting on the side of the bed? : Unable Difficulty sitting down on and standing up from a chair with arms (e.g., wheelchair, bedside commode, etc,.)?: Unable Help needed  moving to and from a bed to chair (including a wheelchair)?: None Help needed walking in hospital room?: A Little Help needed climbing 3-5 steps with a railing? : A Little 6 Click Score: 13    End of Session Equipment Utilized During Treatment: Gait belt Activity Tolerance: Patient tolerated treatment well Patient left: in bed;with call bell/phone within reach;with family/visitor present Nurse Communication: Mobility status PT Visit Diagnosis: Other abnormalities of gait and mobility (R26.89);Pain Pain - Right/Left: Right Pain - part of body: Hip     Time: 7412-8786 PT Time Calculation (min) (ACUTE ONLY): 35 min  Charges:  $Gait Training: 23-37 mins                    G Codes:       [place], [place], [place] 01/22/2018, 5:15 PM

## 2018-01-22 NOTE — Progress Notes (Signed)
Subjective: 1 Day Post-Op Procedure(s) (LRB): RIGHT TOTAL HIP ARTHROPLASTY DIRECT ANTERIOR (Right) Patient reports pain as mild and moderate.    Objective: Vital signs in last 24 hours: Temp:  [97.2 F (36.2 C)-98.3 F (36.8 C)] 98.2 F (36.8 C) (06/27 0450) Pulse Rate:  [73-91] 77 (06/27 0450) Resp:  [12-22] 14 (06/27 0450) BP: (92-134)/(55-80) 101/64 (06/27 0450) SpO2:  [93 %-99 %] 93 % (06/27 0450) Weight:  [185 lb (83.9 kg)-193 lb (87.5 kg)] 185 lb (83.9 kg) (06/26 1045)  Intake/Output from previous day: 06/26 0701 - 06/27 0700 In: 2469.4 [P.O.:480; I.V.:1939.4; IV Piggyback:50] Out: 1000 [Urine:800; Blood:200] Intake/Output this shift: No intake/output data recorded.  Recent Labs    01/22/18 0406  HGB 11.4*   Recent Labs    01/22/18 0406  WBC 8.5  RBC 3.78*  HCT 35.7*  PLT 317   Recent Labs    01/22/18 0406  NA 136  K 4.3  CL 105  CO2 26  BUN 13  CREATININE 0.71  GLUCOSE 118*  CALCIUM 8.4*   No results for input(s): LABPT, INR in the last 72 hours.  Neurologically intact leg length equal. Mild swelling at right hip as expected.  Dg C-arm 1-60 Min  Result Date: 01/21/2018 CLINICAL DATA:  Right total hip arthroplasty. EXAM: DG C-ARM 61-120 MIN; OPERATIVE RIGHT HIP WITH PELVIS COMPARISON:  Hip x-rays dated November 11, 2017. FLUOROSCOPY TIME:  38 seconds. C-arm fluoroscopic images were obtained intraoperatively and submitted for post operative interpretation. FINDINGS: Intraoperative fluoroscopic x-rays demonstrate interval right total hip arthroplasty. Components are well aligned. No acute fracture. Prior left total hip arthroplasty. IMPRESSION: Interval right total hip arthroplasty. Electronically Signed   By: Titus Dubin M.D.   On: 01/21/2018 16:41   Dg C-arm 1-60 Min  Result Date: 01/21/2018 CLINICAL DATA:  Right total hip arthroplasty. EXAM: DG C-ARM 61-120 MIN; OPERATIVE RIGHT HIP WITH PELVIS COMPARISON:  Hip x-rays dated November 11, 2017.  FLUOROSCOPY TIME:  38 seconds. C-arm fluoroscopic images were obtained intraoperatively and submitted for post operative interpretation. FINDINGS: Intraoperative fluoroscopic x-rays demonstrate interval right total hip arthroplasty. Components are well aligned. No acute fracture. Prior left total hip arthroplasty. IMPRESSION: Interval right total hip arthroplasty. Electronically Signed   By: Titus Dubin M.D.   On: 01/21/2018 16:41   Dg Hip Port Unilat With Pelvis 1v Right  Result Date: 01/21/2018 CLINICAL DATA:  Follow-up examination status post right total hip arthroplasty. EXAM: DG HIP (WITH OR WITHOUT PELVIS) 1V PORT RIGHT COMPARISON:  Prior study from earlier the same day. FINDINGS: Right total hip arthroplasty in place. Acetabular and femoral components appear well seated. No periprosthetic fracture or other adverse feature. Postoperative soft tissue swelling with emphysema overlies the right hip. Skin staples in place. Bony pelvis intact. Left hip arthroplasty noted. IMPRESSION: Sequelae of interval right total hip arthroplasty without adverse features. Electronically Signed   By: Jeannine Boga M.D.   On: 01/21/2018 18:15   Dg Hip Operative Unilat W Or W/o Pelvis Right  Result Date: 01/21/2018 CLINICAL DATA:  Right total hip arthroplasty. EXAM: DG C-ARM 61-120 MIN; OPERATIVE RIGHT HIP WITH PELVIS COMPARISON:  Hip x-rays dated November 11, 2017. FLUOROSCOPY TIME:  38 seconds. C-arm fluoroscopic images were obtained intraoperatively and submitted for post operative interpretation. FINDINGS: Intraoperative fluoroscopic x-rays demonstrate interval right total hip arthroplasty. Components are well aligned. No acute fracture. Prior left total hip arthroplasty. IMPRESSION: Interval right total hip arthroplasty. Electronically Signed   By: Orville Govern.D.  On: 01/21/2018 16:41    Assessment/Plan: 1 Day Post-Op Procedure(s) (LRB): RIGHT TOTAL HIP ARTHROPLASTY DIRECT ANTERIOR (Right) Up with  therapy, plan home Friday if makes expected progress and likely will not need HHPT unless progress slow and our therapist recommends.  SL IV  [name] 01/22/2018, 8:11 AM

## 2018-01-22 NOTE — Plan of Care (Signed)
  Problem: Education: Goal: Knowledge of General Education information will improve Outcome: Progressing   Problem: Clinical Measurements: Goal: Will remain free from infection Outcome: Progressing   Problem: Activity: Goal: Risk for activity intolerance will decrease Outcome: Progressing   Problem: Elimination: Goal: Will not experience complications related to bowel motility Outcome: Progressing   Problem: Pain Managment: Goal: General experience of comfort will improve Outcome: Progressing   Problem: Skin Integrity: Goal: Risk for impaired skin integrity will decrease Outcome: Progressing

## 2018-01-22 NOTE — Evaluation (Signed)
Physical Therapy Evaluation Patient Details Name: Emma Lewis MRN: [id number] DOB: 1950/09/07 Today's Date: 01/22/2018   History of Present Illness  Pt is a 67 y/o female s/p R THA. PMH including but not limited to HTN, L THA in 2011 with revision in 2017.  Clinical Impression  Pt presented supine in bed with HOB elevated, awake and willing to participate in therapy session. Prior to admission, pt reported that she ambulated with use of a RW PRN. Pt currently requires min A for bed mobility, min guard for transfers with RW and min guard to ambulate a short distance within her room. Pt would continue to benefit from skilled physical therapy services at this time while admitted and after d/c to address the below listed limitations in order to improve overall safety and independence with functional mobility.     Follow Up Recommendations Home health PT (pending progress with mobility)     Equipment Recommendations  None recommended by PT    Recommendations for Other Services       Precautions / Restrictions Precautions Precautions: Fall Restrictions Weight Bearing Restrictions: Yes RLE Weight Bearing: Weight bearing as tolerated      Mobility  Bed Mobility Overal bed mobility: Needs Assistance Bed Mobility: Supine to Sit     Supine to sit: Min assist     General bed mobility comments: increased time and effort, assistance with R LE movement off of bed  Transfers Overall transfer level: Needs assistance Equipment used: Rolling walker (2 wheeled) Transfers: Sit to/from [facility] Sit to Stand: Min guard Stand pivot transfers: Min guard       General transfer comment: increased time and effort, good technique, min guard for safety with transition movement into standing from EOB x1 and from [facility] x1  Ambulation/Gait Ambulation/Gait assistance: Min guard Gait Distance (Feet): 5 Feet Assistive device: Rolling walker (2 wheeled) Gait Pattern/deviations:  Step-to pattern;Step-through pattern;Decreased step length - left;Decreased stance time - right;Decreased stride length;Decreased weight shift to right Gait velocity: decreased Gait velocity interpretation: <1.31 ft/sec, indicative of household ambulator General Gait Details: pt steady with RW but very limited secondary to fatigue and pain  Stairs            Wheelchair Mobility    Modified Rankin (Stroke Patients Only)       Balance Overall balance assessment: Needs assistance Sitting-balance support: Feet supported Sitting balance-Leahy Scale: Good     Standing balance support: During functional activity;Bilateral upper extremity supported Standing balance-Leahy Scale: Poor                               Pertinent Vitals/Pain Pain Assessment: Faces Faces Pain Scale: Hurts a little bit Pain Location: R hip Pain Descriptors / Indicators: Sore Pain Intervention(s): Monitored during session;Repositioned    Home Living Family/patient expects to be discharged to:: Private residence Living Arrangements: Spouse/significant other Available Help at Discharge: Family;Available 24 hours/day Type of Home: House Home Access: Stairs to enter Entrance Stairs-Rails: Psychiatric nurse of Steps: 4 Home Layout: One level Home Equipment: Walker - 2 wheels;Cane - single point;Bedside commode      Prior Function Level of Independence: Independent with assistive device(s)         Comments: pt reported that she has been using RW PRN     Hand Dominance        Extremity/Trunk Assessment   Upper Extremity Assessment Upper Extremity Assessment: Overall WFL for tasks assessed  Lower Extremity Assessment Lower Extremity Assessment: RLE deficits/detail RLE Deficits / Details: pt with decreased strength and ROM limitations secondary to post-op pain and weakness.       Communication   Communication: No difficulties  Cognition Arousal/Alertness:  Awake/alert Behavior During Therapy: WFL for tasks assessed/performed Overall Cognitive Status: Within Functional Limits for tasks assessed                                        General Comments      Exercises     Assessment/Plan    PT Assessment Patient needs continued PT services  PT Problem List Decreased strength;Decreased range of motion;Decreased activity tolerance;Decreased balance;Decreased mobility;Decreased coordination;Decreased knowledge of use of DME;Decreased safety awareness;Decreased knowledge of precautions;Pain       PT Treatment Interventions DME instruction;Gait training;Functional mobility training;Stair training;Therapeutic activities;Therapeutic exercise;Balance training;Neuromuscular re-education;Patient/family education    PT Goals (Current goals can be found in the Care Plan section)  Acute Rehab PT Goals Patient Stated Goal: return home tomorrow PT Goal Formulation: With patient Time For Goal Achievement: 02/05/18 Potential to Achieve Goals: Good    Frequency 7X/week   Barriers to discharge        Co-evaluation               AM-PAC PT "6 Clicks" Daily Activity  Outcome Measure Difficulty turning over in bed (including adjusting bedclothes, sheets and blankets)?: Unable Difficulty moving from lying on back to sitting on the side of the bed? : Unable Difficulty sitting down on and standing up from a chair with arms (e.g., wheelchair, bedside commode, etc,.)?: Unable Help needed moving to and from a bed to chair (including a wheelchair)?: A Little Help needed walking in hospital room?: A Little Help needed climbing 3-5 steps with a railing? : A Little 6 Click Score: 12    End of Session Equipment Utilized During Treatment: Gait belt Activity Tolerance: Patient limited by pain;Patient limited by fatigue Patient left: in chair;with call bell/phone within reach Nurse Communication: Mobility status PT Visit Diagnosis: Other  abnormalities of gait and mobility (R26.89);Pain Pain - Right/Left: Right Pain - part of body: Hip    Time: 4196-2229 PT Time Calculation (min) (ACUTE ONLY): 35 min   Charges:   PT Evaluation $PT Eval Moderate Complexity: 1 Mod PT Treatments $Therapeutic Activity: 8-22 mins   PT G Codes:        [place], PT, DPT [place] 01/22/2018, 12:52 PM

## 2018-01-22 NOTE — Anesthesia Postprocedure Evaluation (Signed)
Anesthesia Post Note  Patient: Emma Lewis  Procedure(s) Performed: RIGHT TOTAL HIP ARTHROPLASTY DIRECT ANTERIOR (Right Hip)     Patient location during evaluation: PACU Anesthesia Type: Spinal and MAC Level of consciousness: awake and alert Pain management: pain level controlled Vital Signs Assessment: post-procedure vital signs reviewed and stable Respiratory status: spontaneous breathing, nonlabored ventilation, respiratory function stable and patient connected to nasal cannula oxygen Cardiovascular status: stable and blood pressure returned to baseline Postop Assessment: no apparent nausea or vomiting and spinal receding Anesthetic complications: no    Last Vitals:  Vitals:   01/22/18 0006 01/22/18 0450  BP: (!) 107/58 101/64  Pulse: 78 77  Resp: 14 14  Temp: 36.7 C 36.8 C  SpO2: 97% 93%    Last Pain:  Vitals:   01/22/18 0700  TempSrc:   PainSc: 4                  Hermenegildo Clausen

## 2018-01-23 LAB — CBC
HCT: 36 % (ref 36.0–46.0)
Hemoglobin: 11.6 g/dL — ABNORMAL LOW (ref 12.0–15.0)
MCH: 30.4 pg (ref 26.0–34.0)
MCHC: 32.2 g/dL (ref 30.0–36.0)
MCV: 94.2 fL (ref 78.0–100.0)
PLATELETS: 267 10*3/uL (ref 150–400)
RBC: 3.82 MIL/uL — ABNORMAL LOW (ref 3.87–5.11)
RDW: 14.1 % (ref 11.5–15.5)
WBC: 10.1 10*3/uL (ref 4.0–10.5)

## 2018-01-23 MED ORDER — ASPIRIN 325 MG PO TBEC: 325.0000 mg | DELAYED_RELEASE_TABLET | Freq: Every day | ORAL | 0 refills | Status: DC

## 2018-01-23 MED ORDER — OXYCODONE-ACETAMINOPHEN 5-325 MG PO TABS: 1.0000 | ORAL_TABLET | Freq: Four times a day (QID) | ORAL | 0 refills | Status: DC | PRN

## 2018-01-23 MED ORDER — METHOCARBAMOL 500 MG PO TABS: 500.0000 mg | ORAL_TABLET | Freq: Four times a day (QID) | ORAL | 0 refills | Status: DC | PRN

## 2018-01-23 NOTE — Progress Notes (Signed)
Physical Therapy Treatment Patient Details Name: Emma Lewis MRN: [id number] DOB: 06/27/1951 Today's Date: 01/23/2018    History of Present Illness Pt is a 67 y/o female s/p R THA. PMH including but not limited to HTN, L THA in 2011 with revision in 2017.    PT Comments    Pt demonstrates good form in bed mobility, transfers, and stair ambulation, requiring increased time secondary to pain. She tolerated introduction of her HEP with minor increase in pain. She is progressing toward her goals, partially meeting all but ambulation. She would benefit from HHPT to further improve her function.  Follow Up Recommendations  Home health PT     Equipment Recommendations  None recommended by PT    Recommendations for Other Services       Precautions / Restrictions Precautions Precautions: Fall Restrictions Weight Bearing Restrictions: No RLE Weight Bearing: Weight bearing as tolerated    Mobility  Bed Mobility Overal bed mobility: Needs Assistance Bed Mobility: Supine to Sit;Sit to Supine     Supine to sit: Supervision Sit to supine: Supervision   General bed mobility comments: increased time and effort, hook maneuver for RLE movement back onto bed  Transfers Overall transfer level: Needs assistance Equipment used: Rolling walker (2 wheeled) Transfers: Sit to/from Stand Sit to Stand: Min guard Stand pivot transfers: Min guard       General transfer comment: increased time and effort, good technique  Ambulation/Gait                 Stairs Stairs: Yes Stairs assistance: Min guard Stair Management: Two rails Number of Stairs: 4 General stair comments: already familiar with sequencing, increased time required for descent   Wheelchair Mobility    Modified Rankin (Stroke Patients Only)       Balance Overall balance assessment: Needs assistance Sitting-balance support: Feet supported Sitting balance-Leahy Scale: Good     Standing balance support:  During functional activity;Bilateral upper extremity supported Standing balance-Leahy Scale: Poor                              Cognition Arousal/Alertness: Awake/alert Behavior During Therapy: WFL for tasks assessed/performed Overall Cognitive Status: Within Functional Limits for tasks assessed                                        Exercises Total Joint Exercises Ankle Circles/Pumps: AROM;Both;20 reps;Supine Quad Sets: AROM;Right;10 reps;Supine Short Arc Quad: AROM;Right;10 reps;Supine Heel Slides: AAROM;Right;10 reps;Supine Hip ABduction/ADduction: AROM;Right;10 reps;Supine Long Arc Quad: AROM;Right;10 reps;Seated    General Comments        Pertinent Vitals/Pain Pain Assessment: 0-10 Pain Score: 5 (at end of treatment) Pain Location: R hip, L hand (IV site) Pain Descriptors / Indicators: Burning Pain Intervention(s): Monitored during session;Repositioned    Home Living                      Prior Function            PT Goals (current goals can now be found in the care plan section) Acute Rehab PT Goals Patient Stated Goal: return home tomorrow PT Goal Formulation: With patient Potential to Achieve Goals: Good Progress towards PT goals: Progressing toward goals    Frequency    7X/week      PT Plan Current plan remains appropriate  Co-evaluation              AM-PAC PT "6 Clicks" Daily Activity  Outcome Measure  Difficulty turning over in bed (including adjusting bedclothes, sheets and blankets)?: A Little Difficulty moving from lying on back to sitting on the side of the bed? : A Lot Difficulty sitting down on and standing up from a chair with arms (e.g., wheelchair, bedside commode, etc,.)?: A Little Help needed moving to and from a bed to chair (including a wheelchair)?: None Help needed walking in hospital room?: A Little Help needed climbing 3-5 steps with a railing? : A Little 6 Click Score: 18    End  of Session Equipment Utilized During Treatment: Gait belt Activity Tolerance: Patient tolerated treatment well Patient left: in bed;with call bell/phone within reach;with bed alarm set Nurse Communication: Mobility status PT Visit Diagnosis: Other abnormalities of gait and mobility (R26.89);Pain Pain - Right/Left: Right Pain - part of body: Hip     Time: 6153-7943 PT Time Calculation (min) (ACUTE ONLY): 52 min  Charges:  $Gait Training: 8-22 mins $Therapeutic Exercise: 8-22 mins $Therapeutic Activity: 8-22 mins                    G Codes:       Scarlett Presto, SPTA   Rayelynn Loyal Sheila Oats 01/23/2018, 2:21 PM

## 2018-01-23 NOTE — Progress Notes (Signed)
Pt given discharge instructions and gone over with her and husband present. Bandage changed per MD request. All belongings gathered to be sent home. Pt in no distress at time of discharge.

## 2018-01-23 NOTE — Progress Notes (Signed)
   Subjective: 2 Days Post-Op Procedure(s) (LRB): RIGHT TOTAL HIP ARTHROPLASTY DIRECT ANTERIOR (Right) Patient reports pain as moderate.    Objective: Vital signs in last 24 hours: Temp:  [98.1 F (36.7 C)-98.7 F (37.1 C)] 98.1 F (36.7 C) (06/28 0786) Pulse Rate:  [73-87] 79 (06/28 0608) Resp:  [15] 15 (06/27 1950) BP: (120-132)/(65-70) 129/70 (06/28 0608) SpO2:  [94 %-97 %] 94 % (06/28 7544)  Intake/Output from previous day: 06/27 0701 - 06/28 0700 In: 360 [P.O.:360] Out: -  Intake/Output this shift: No intake/output data recorded.  Recent Labs    01/22/18 0406 01/23/18 0635  HGB 11.4* 11.6*   Recent Labs    01/22/18 0406 01/23/18 0635  WBC 8.5 10.1  RBC 3.78* 3.82*  HCT 35.7* 36.0  PLT 317 267   Recent Labs    01/22/18 0406  NA 136  K 4.3  CL 105  CO2 26  BUN 13  CREATININE 0.71  GLUCOSE 118*  CALCIUM 8.4*   No results for input(s): LABPT, INR in the last 72 hours.  Neurologically intact LL equal No results found.  Assessment/Plan: 2 Days Post-Op Procedure(s) (LRB): RIGHT TOTAL HIP ARTHROPLASTY DIRECT ANTERIOR (Right) Discharge home with home health  Emma Lewis 01/23/2018, 7:38 AM

## 2018-01-23 NOTE — Care Management Important Message (Signed)
Important Message  Patient Details  Name: Emma Lewis MRN: [id number] Date of Birth: [birth year]   Medicare Important Message Given:  Yes    [name] 01/23/2018, 2:36 PM

## 2018-01-23 NOTE — Care Management Note (Signed)
Case Management Note  Patient Details  Name: Emma Lewis MRN: [id number] Date of Birth: July 06, 1951  Subjective/Objective:   67 yr old female s/p right total hip arthroplasty.                 Action/Plan:  Case manager spoke with patient and her husband concerning discharge plan and DME. Choice for [place] was offered, referral was called to [facility] intake. Patient has all necessary DME and will have family support at discharge.      Expected Discharge Date:  01/23/18               Expected Discharge Plan:  [place]  In-House Referral:  Clinical Social Work  Discharge planning Services  CM Consult  Post Acute Care Choice:  Home Health Choice offered to:  Patient  DME Arranged:  (has RW and 3in1) DME Agency:  NA  HH Arranged:  HHPT HH Agency:  [place]  Status of Service:  Completed, signed off  If discussed at [place] of Stay Meetings, dates discussed:    Additional Comments:  Ninfa Meeker, RN 01/23/2018, 12:36 PM

## 2018-01-24 DIAGNOSIS — K760 Fatty (change of) liver, not elsewhere classified: Secondary | ICD-10-CM | POA: Diagnosis not present

## 2018-01-24 DIAGNOSIS — H8110 Benign paroxysmal vertigo, unspecified ear: Secondary | ICD-10-CM | POA: Diagnosis not present

## 2018-01-24 DIAGNOSIS — Z471 Aftercare following joint replacement surgery: Secondary | ICD-10-CM | POA: Diagnosis not present

## 2018-01-24 DIAGNOSIS — M1611 Unilateral primary osteoarthritis, right hip: Secondary | ICD-10-CM | POA: Diagnosis not present

## 2018-01-24 DIAGNOSIS — N189 Chronic kidney disease, unspecified: Secondary | ICD-10-CM | POA: Diagnosis not present

## 2018-01-24 DIAGNOSIS — I129 Hypertensive chronic kidney disease with stage 1 through stage 4 chronic kidney disease, or unspecified chronic kidney disease: Secondary | ICD-10-CM | POA: Diagnosis not present

## 2018-01-26 ENCOUNTER — Other Ambulatory Visit: Payer: Self-pay | Admitting: Orthopaedic Surgery

## 2018-01-26 ENCOUNTER — Telehealth (INDEPENDENT_AMBULATORY_CARE_PROVIDER_SITE_OTHER): Payer: Self-pay | Admitting: Orthopaedic Surgery

## 2018-01-26 NOTE — Telephone Encounter (Signed)
Catherine @ [facility] states patient was complaining of swelling & burning in right hip and has refused physical therapy today. Patient also only was PT 2 times a week for 2 weeks.   Catherine's call back # [phone]

## 2018-01-26 NOTE — Telephone Encounter (Signed)
I called her. She wants to cancel therapy. Please call and cancel therapy.thanks

## 2018-01-26 NOTE — Telephone Encounter (Signed)
FYI  I called and spoke with patient. She states that she has had swelling and burning since the surgery. It has not gone down. She is keeping her ice packs on it. She states that Dr. Lorin Mercy told her the swelling was from the surgery and when it went down, the burning would stop.  Patient states that HHPT called her this morning and woke her up. She did not want to be bothered today.  She was trying to get her bearings about her.  She is walking with her walker, doing the exercises that she was taught, and getting up and going anywhere that she wants to in the house.

## 2018-01-26 NOTE — Telephone Encounter (Signed)
I called [place], and advised.

## 2018-02-03 ENCOUNTER — Ambulatory Visit (INDEPENDENT_AMBULATORY_CARE_PROVIDER_SITE_OTHER): Payer: Medicare Other | Admitting: Orthopaedic Surgery

## 2018-02-03 ENCOUNTER — Encounter (INDEPENDENT_AMBULATORY_CARE_PROVIDER_SITE_OTHER): Payer: Self-pay | Admitting: Orthopaedic Surgery

## 2018-02-03 ENCOUNTER — Ambulatory Visit (INDEPENDENT_AMBULATORY_CARE_PROVIDER_SITE_OTHER): Payer: Medicare Other

## 2018-02-03 VITALS — BP 137/69 | HR 75 | Ht 65.0 in | Wt 193.0 lb

## 2018-02-03 DIAGNOSIS — Z96641 Presence of right artificial hip joint: Secondary | ICD-10-CM | POA: Diagnosis not present

## 2018-02-03 MED ORDER — HYDROCODONE-ACETAMINOPHEN 5-325 MG PO TABS: 1.0000 | ORAL_TABLET | Freq: Two times a day (BID) | ORAL | 0 refills | Status: DC | PRN

## 2018-02-03 NOTE — Discharge Summary (Signed)
Patient ID: Emma Lewis MRN: [id number] DOB/AGE: 09/27/1950 67 y.o.  Admit date: 01/21/2018 Discharge date: 02/03/2018  Admission Diagnoses:  Active Problems:   * No active hospital problems. *   Discharge Diagnoses:  Active Problems:   * No active hospital problems. *  status post Procedure(s): RIGHT TOTAL HIP ARTHROPLASTY DIRECT ANTERIOR  Past Medical History:  Diagnosis Date  . Arthritis   . Bronchitis    hx of  . Fatty liver   . Fever blister    Takes Acyclovir  . GERD (gastroesophageal reflux disease)   . Hyperlipemia   . Hypothyroidism   . Migraines   . Osteopenia   . Pneumonia    hx of     YRS AGO  . PONV (postoperative nausea and vomiting)   . Seasonal allergies   . Vertigo     Surgeries: Procedure(s): RIGHT TOTAL HIP ARTHROPLASTY DIRECT ANTERIOR on 01/21/2018   Consultants:   Discharged Condition: Improved  Hospital Course: Emma Lewis is an 67 y.o. female who was admitted 01/21/2018 for operative treatment of right hip arthritis. Patient failed conservative treatments (please see the history and physical for the specifics) and had severe unremitting pain that affects sleep, daily activities and work/hobbies. After pre-op clearance, the patient was taken to the operating room on 01/21/2018 and underwent  Procedure(s): RIGHT TOTAL HIP ARTHROPLASTY DIRECT ANTERIOR.    Patient was given perioperative antibiotics:  Anti-infectives (From admission, onward)   Start     Dose/Rate Route Frequency Ordered Stop   01/21/18 2000  ceFAZolin (ANCEF) IVPB 1 g/50 mL premix     1 g 100 mL/hr over 30 Minutes Intravenous Every 8 hours 01/21/18 1654 01/22/18 0634   01/21/18 1130  ceFAZolin (ANCEF) IVPB 2g/100 mL premix     2 g 200 mL/hr over 30 Minutes Intravenous To [facility]Stay [facility] 01/20/18 1238 01/21/18 1256       Patient was given sequential compression devices and early ambulation to prevent DVT.   Patient benefited maximally from hospital stay and there were no  complications. At the time of discharge, the patient was urinating/moving their bowels without difficulty, tolerating a regular diet, pain is controlled with oral pain medications and they have been cleared by PT/OT.   Recent vital signs: No data found.   Recent laboratory studies: No results for input(s): WBC, HGB, HCT, PLT, NA, K, CL, CO2, BUN, CREATININE, GLUCOSE, INR, CALCIUM in the last 72 hours.  Invalid input(s): PT, 2   Discharge Medications:   Allergies as of 01/23/2018      Reactions   Sulfa Antibiotics Other (See Comments)   UNSPECIFIED REACTION > Childhood allergy Mother said "I liked to have died"   Fenofibrate Rash   She didn't feel right      Medication List    STOP taking these medications   acyclovir 400 MG tablet Commonly known as:  ZOVIRAX   ciprofloxacin 500 MG tablet Commonly known as:  CIPRO   HYDROcodone-acetaminophen 7.5-325 MG tablet Commonly known as:  NORCO   ibuprofen 200 MG tablet Commonly known as:  ADVIL,MOTRIN   ondansetron 4 MG disintegrating tablet Commonly known as:  ZOFRAN ODT     TAKE these medications   alendronate 70 MG tablet Commonly known as:  FOSAMAX Take 70 mg by mouth every Sunday.   ASPERCREME W/LIDOCAINE 4 % cream Generic drug:  lidocaine Apply 1 application topically daily.   aspirin 325 MG EC tablet Take 1 tablet (325 mg total) by mouth daily  with breakfast.   Biotin Plus Keratin 10000-100 MCG-MG Tabs Take 1 tablet by mouth 3 (three) times daily.   CALCIUM 600 + D PO Take 1 tablet by mouth 3 (three) times daily.   clobetasol cream 0.05 % Commonly known as:  TEMOVATE Apply 1 application topically 2 (two) times daily. What changed:  when to take this   Evening Primrose Oil 1000 MG Caps Take 1,000 mg by mouth 3 (three) times daily.   levothyroxine 100 MCG tablet Commonly known as:  SYNTHROID, LEVOTHROID Take 100 mcg by mouth daily before breakfast.   meclizine 25 MG tablet Commonly known as:   ANTIVERT Take 1 tablet (25 mg total) by mouth 3 (three) times daily as needed for dizziness. Vertigo. Also available OTC What changed:    when to take this  additional instructions   methocarbamol 500 MG tablet Commonly known as:  ROBAXIN Take 1 tablet (500 mg total) by mouth every 6 (six) hours as needed for muscle spasms.   nystatin-triamcinolone cream Commonly known as:  MYCOLOG II Apply 1 application topically 3 (three) times daily.   OMEGA-3 FISH OIL PO Take 2,080 mg by mouth daily. 1,040mg  each   omeprazole 20 MG capsule Commonly known as:  PRILOSEC Take 10 mg by mouth daily.   OVER THE COUNTER MEDICATION Take 1 tablet by mouth daily. Liver Aid   oxyCODONE-acetaminophen 5-325 MG tablet Commonly known as:  PERCOCET Take 1-2 tablets by mouth every 6 (six) hours as needed for severe pain.   phentermine 37.5 MG capsule Take 37.5 mg by mouth every morning.   simvastatin 40 MG tablet Commonly known as:  ZOCOR Take 40 mg by mouth at bedtime.   SOOTHE XP Soln Place 1 drop into both eyes daily as needed (dry eyes).   TURMERIC CURCUMIN PO Take 1 capsule by mouth 2 (two) times daily. 500mg  each   Vitamin D3 2000 units Tabs Take 2,000 Units by mouth 3 (three) times daily.       Diagnostic Studies: Dg Chest 2 View  Result Date: 01/09/2018 CLINICAL DATA:  Total hip replacement preop EXAM: CHEST - 2 VIEW COMPARISON:  01/16/2017 FINDINGS: Linear scarring at the left base. Right lung clear. Heart is normal size. No effusions or acute bony abnormality. IMPRESSION: No active cardiopulmonary disease. Electronically Signed   By: Rolm Baptise M.D.   On: 01/09/2018 11:35   Dg C-arm 1-60 Min  Result Date: 01/21/2018 CLINICAL DATA:  Right total hip arthroplasty. EXAM: DG C-ARM 61-120 MIN; OPERATIVE RIGHT HIP WITH PELVIS COMPARISON:  Hip x-rays dated November 11, 2017. FLUOROSCOPY TIME:  38 seconds. C-arm fluoroscopic images were obtained intraoperatively and submitted for post  operative interpretation. FINDINGS: Intraoperative fluoroscopic x-rays demonstrate interval right total hip arthroplasty. Components are well aligned. No acute fracture. Prior left total hip arthroplasty. IMPRESSION: Interval right total hip arthroplasty. Electronically Signed   By: Titus Dubin M.D.   On: 01/21/2018 16:41   Dg C-arm 1-60 Min  Result Date: 01/21/2018 CLINICAL DATA:  Right total hip arthroplasty. EXAM: DG C-ARM 61-120 MIN; OPERATIVE RIGHT HIP WITH PELVIS COMPARISON:  Hip x-rays dated November 11, 2017. FLUOROSCOPY TIME:  38 seconds. C-arm fluoroscopic images were obtained intraoperatively and submitted for post operative interpretation. FINDINGS: Intraoperative fluoroscopic x-rays demonstrate interval right total hip arthroplasty. Components are well aligned. No acute fracture. Prior left total hip arthroplasty. IMPRESSION: Interval right total hip arthroplasty. Electronically Signed   By: Titus Dubin M.D.   On: 01/21/2018 16:41   Dg Hip  Port Unilat With Pelvis 1v Right  Result Date: 01/21/2018 CLINICAL DATA:  Follow-up examination status post right total hip arthroplasty. EXAM: DG HIP (WITH OR WITHOUT PELVIS) 1V PORT RIGHT COMPARISON:  Prior study from earlier the same day. FINDINGS: Right total hip arthroplasty in place. Acetabular and femoral components appear well seated. No periprosthetic fracture or other adverse feature. Postoperative soft tissue swelling with emphysema overlies the right hip. Skin staples in place. Bony pelvis intact. Left hip arthroplasty noted. IMPRESSION: Sequelae of interval right total hip arthroplasty without adverse features. Electronically Signed   By: Jeannine Boga M.D.   On: 01/21/2018 18:15   Dg Hip Operative Unilat W Or W/o Pelvis Right  Result Date: 01/21/2018 CLINICAL DATA:  Right total hip arthroplasty. EXAM: DG C-ARM 61-120 MIN; OPERATIVE RIGHT HIP WITH PELVIS COMPARISON:  Hip x-rays dated November 11, 2017. FLUOROSCOPY TIME:  38 seconds.  C-arm fluoroscopic images were obtained intraoperatively and submitted for post operative interpretation. FINDINGS: Intraoperative fluoroscopic x-rays demonstrate interval right total hip arthroplasty. Components are well aligned. No acute fracture. Prior left total hip arthroplasty. IMPRESSION: Interval right total hip arthroplasty. Electronically Signed   By: Titus Dubin M.D.   On: 01/21/2018 16:41   Xr Hip Unilat W Or W/o Pelvis 2-3 Views Right  Result Date: 02/03/2018 Standing AP pelvis show her hips and frog-leg lateral right hip shows satisfactory postop total hip arthroplasty.  Leg lengths are equal. Impression: Satisfactory postop right total hip arthroplasty.  Previous left total hip arthroplasty without evidence of subsidence or loosening.     Follow-up [facility] Follow up.   Specialty:  [place] Why:  A representative from [facility] will contact you to arrange start date and time for your therapy. Contact information: [address] [place] [address] [phone]           Discharge Plan:  discharge to home  Disposition:     Signed: Benjiman Core  02/03/2018, 12:13 PM

## 2018-02-03 NOTE — Progress Notes (Signed)
Post-Op Visit Note   Patient: Emma Lewis           Date of Birth: February 02, 1951           MRN: [id number] Visit Date: 02/03/2018 PCP: Philmore Pali, NP   Assessment & Plan: Postop right total hip arthroplasty.  She is Dealer with a walker.  Leg lengths are equal.  Prescription for Norco prescribed # 30 ONE PO BID PRN  Recheck 6 weeks.  She still taking her ibuprofen.  Chief Complaint:  Chief Complaint  Patient presents with  . Right Hip - Routine Post Op   Visit Diagnoses:  1. Status post total hip replacement, right     Plan: Postop right total hip arthroplasty direct anterior approach.  She has mild to moderate amount of postop swelling.  Follow-Up Instructions: No follow-ups on file.   Orders:  Orders Placed This Encounter  Procedures  . XR HIP UNILAT W OR W/O PELVIS 2-3 VIEWS RIGHT   No orders of the defined types were placed in this encounter.   Imaging: Xr Hip Unilat W Or W/o Pelvis 2-3 Views Right  Result Date: 02/03/2018 Standing AP pelvis show her hips and frog-leg lateral right hip shows satisfactory postop total hip arthroplasty.  Leg lengths are equal. Impression: Satisfactory postop right total hip arthroplasty.  Previous left total hip arthroplasty without evidence of subsidence or loosening.   PMFS History: Patient Active Problem List   Diagnosis Date Noted  . Arthritis of right hip 01/21/2018  . Hx of total hip arthroplasty, left 11/12/2017  . Bilateral primary osteoarthritis of knee 11/12/2017  . Unilateral primary osteoarthritis, right hip 11/12/2017  . GERD (gastroesophageal reflux disease) 01/17/2017  . Hypothyroidism 01/17/2017  . Vertigo 01/16/2017  . Nausea and vomiting 01/16/2017  . Essential hypertension 01/16/2017  . Fatty liver 01/16/2017  . Failed total hip arthroplasty ([place]) 06/10/2016  . Pelvic mass 06/15/2015  . Closed wedge compression fracture of fifth lumbar vertebra ([place])   . Cervical spondylosis 02/09/2014  . DDD (degenerative  disc disease), lumbar 05/13/2013  . OA (osteoarthritis) 05/13/2013   Past Medical History:  Diagnosis Date  . Arthritis   . Bronchitis    hx of  . Fatty liver   . Fever blister    Takes Acyclovir  . GERD (gastroesophageal reflux disease)   . Hyperlipemia   . Hypothyroidism   . Migraines   . Osteopenia   . Pneumonia    hx of     YRS AGO  . PONV (postoperative nausea and vomiting)   . Seasonal allergies   . Vertigo     Family History  Problem Relation Age of Onset  . Lymphoma Mother     Past Surgical History:  Procedure Laterality Date  . ANTERIOR CERVICAL DECOMP/DISCECTOMY FUSION N/A 02/09/2014   Procedure: ANTERIOR CERVICAL DECOMPRESSION/DISCECTOMY FUSION 2 LEVELS;  Surgeon: [name], MD;  Location: [facility];  Service: Orthopedics;  Laterality: N/A;  C4-5, C5-6 Anterior Cervical Discectomy and Fusion, Allograft, Plate  . BACK SURGERY     lumbar fusion  X2  . CATARACT EXTRACTION W/ INTRAOCULAR LENS  IMPLANT, BILATERAL    . COLONOSCOPY    . GANGLION CYST EXCISION Left 1970s   wrist  . HAND SURGERY  2018  . HIP ARTHROPLASTY Left 2011  . JOINT REPLACEMENT    . KNEE SURGERY Right    3 TOTAL  . ROBOTIC ASSISTED BILATERAL SALPINGO OOPHERECTOMY Bilateral 08/01/2015   Procedure: ROBOTIC ASSISTED BILATERAL SALPINGO  OOPHORECTOMY;  Surgeon: Everitt Amber, MD;  Location: [facility];  Service: Gynecology;  Laterality: Bilateral;  . SHOULDER OPEN ROTATOR CUFF REPAIR Right    3 TOTAL  . SHOULDER SURGERY Right   . TOTAL HIP ARTHROPLASTY Right 01/21/2018   Procedure: RIGHT TOTAL HIP ARTHROPLASTY DIRECT ANTERIOR;  Surgeon: [name], MD;  Location: [facility];  Service: Orthopedics;  Laterality: Right;  . TOTAL HIP REVISION Left 06/10/2016   Procedure: TOTAL HIP REVISION ARTHROPLASTY;  Surgeon: Rod Can, MD;  Location: [facility];  Service: Orthopedics;  Laterality: Left;   Social History   Occupational History  . Not on file  Tobacco Use  . Smoking status: Never Smoker  . Smokeless  tobacco: Never Used  Substance and Sexual Activity  . Alcohol use: Yes    Comment: rare  . Drug use: No  . Sexual activity: Not Currently

## 2018-02-13 DIAGNOSIS — R7303 Prediabetes: Secondary | ICD-10-CM | POA: Diagnosis not present

## 2018-02-13 DIAGNOSIS — Z1339 Encounter for screening examination for other mental health and behavioral disorders: Secondary | ICD-10-CM | POA: Diagnosis not present

## 2018-02-13 DIAGNOSIS — E669 Obesity, unspecified: Secondary | ICD-10-CM | POA: Diagnosis not present

## 2018-02-13 DIAGNOSIS — K760 Fatty (change of) liver, not elsewhere classified: Secondary | ICD-10-CM | POA: Diagnosis not present

## 2018-02-13 DIAGNOSIS — Z713 Dietary counseling and surveillance: Secondary | ICD-10-CM | POA: Diagnosis not present

## 2018-02-19 ENCOUNTER — Other Ambulatory Visit (INDEPENDENT_AMBULATORY_CARE_PROVIDER_SITE_OTHER): Payer: Self-pay | Admitting: Radiology

## 2018-02-19 MED ORDER — MUPIROCIN 2 % EX OINT: TOPICAL_OINTMENT | CUTANEOUS | 0 refills | Status: DC

## 2018-02-19 NOTE — Progress Notes (Signed)
Sent in to pharmacy.  

## 2018-02-26 ENCOUNTER — Other Ambulatory Visit: Payer: Self-pay

## 2018-03-06 ENCOUNTER — Other Ambulatory Visit: Payer: Self-pay | Admitting: Orthopaedic Surgery

## 2018-03-17 ENCOUNTER — Encounter (INDEPENDENT_AMBULATORY_CARE_PROVIDER_SITE_OTHER): Payer: Self-pay | Admitting: Orthopaedic Surgery

## 2018-03-17 ENCOUNTER — Ambulatory Visit (INDEPENDENT_AMBULATORY_CARE_PROVIDER_SITE_OTHER): Payer: Medicare Other | Admitting: Orthopaedic Surgery

## 2018-03-17 VITALS — BP 132/75 | HR 79 | Ht 65.0 in | Wt 193.0 lb

## 2018-03-17 DIAGNOSIS — M170 Bilateral primary osteoarthritis of knee: Secondary | ICD-10-CM

## 2018-03-17 DIAGNOSIS — Z96641 Presence of right artificial hip joint: Secondary | ICD-10-CM | POA: Diagnosis not present

## 2018-03-17 MED ORDER — LIDOCAINE HCL 1 % IJ SOLN
0.5000 mL | INTRAMUSCULAR | Status: AC | PRN
  Administered 2018-03-17: .5 mL

## 2018-03-17 MED ORDER — BUPIVACAINE HCL 0.5 % IJ SOLN
3.0000 mL | INTRAMUSCULAR | Status: AC | PRN
  Administered 2018-03-17: 3 mL via INTRA_ARTICULAR

## 2018-03-17 MED ORDER — METHYLPREDNISOLONE ACETATE 40 MG/ML IJ SUSP
40.0000 mg | INTRAMUSCULAR | Status: AC | PRN
  Administered 2018-03-17: 40 mg via INTRA_ARTICULAR

## 2018-03-17 MED ORDER — BUPIVACAINE HCL 0.25 % IJ SOLN
4.0000 mL | INTRAMUSCULAR | Status: AC | PRN
  Administered 2018-03-17: 4 mL via INTRA_ARTICULAR

## 2018-03-17 NOTE — Progress Notes (Signed)
Office Visit Note   Patient: Emma Lewis           Date of Birth: October 11, 1950           MRN: [id number] Visit Date: 03/17/2018              Requested by: Philmore Pali, NP [address] [place], [place] [address] PCP: Philmore Pali, NP   Assessment & Plan: Visit Diagnoses:  1. Bilateral primary osteoarthritis of knee   2. Status post total hip replacement, right     Plan: Post right total hip arthroplasty patient states her leg lengths feel better she was short preoperatively on that side.  She is having problems with bilateral knee osteoarthritis with patella alter bilaterally and had previous patella fracture on the right.  She requested bilateral knee injections which was performed today.  Follow-Up Instructions: Return in about 2 months (around 05/17/2018).   Orders:  Orders Placed This Encounter  Procedures  . Large Joint Inj  . Large Joint Inj   No orders of the defined types were placed in this encounter.     Procedures: Large Joint Inj: R knee on 03/17/2018 9:45 AM Indications: pain and joint swelling Details: 22 G 1.5 in needle, anterolateral approach  Arthrogram: No  Medications: 40 mg methylPREDNISolone acetate 40 MG/ML; 0.5 mL lidocaine 1 %; 4 mL bupivacaine 0.25 % Outcome: tolerated well, no immediate complications Procedure, treatment alternatives, risks and benefits explained, specific risks discussed. Consent was given by the patient. Immediately prior to procedure a time out was called to verify the correct patient, procedure, equipment, support staff and site/side marked as required. Patient was prepped and draped in the usual sterile fashion.   Large Joint Inj: L knee on 03/17/2018 9:45 AM Indications: joint swelling and pain Details: 22 G 1.5 in needle, anterolateral approach  Arthrogram: No  Medications: 0.5 mL lidocaine 1 %; 3 mL bupivacaine 0.5 %; 40 mg methylPREDNISolone acetate 40 MG/ML Outcome: tolerated well, no immediate complications Procedure,  treatment alternatives, risks and benefits explained, specific risks discussed. Consent was given by the patient. Immediately prior to procedure a time out was called to verify the correct patient, procedure, equipment, support staff and site/side marked as required. Patient was prepped and draped in the usual sterile fashion.       Clinical Data: No additional findings.   Subjective: Chief Complaint  Patient presents with  . Right Hip - Routine Post Op, Follow-up  . Left Knee - Pain  . Right Knee - Pain    HPI patient returns post right total Par[place] placed 01/21/2018.  She is been walking with a cane in the community walks without her cane in the house she got on the elliptical machine started having increased bilateral knee pain is requesting bilateral knee injections.  This was performed today at her request.  Previous patella fracture on the right treated by Dr. Laurina Bustle with transverse incision.  Review of Systems 14 point review of systems updated unchanged from surgery June 2019 other than as mentioned in HPI.   Objective: Vital Signs: BP 132/75   Pulse 79   Ht 5\' 5"  (1.651 m)   Wt 193 lb (87.5 kg)   BMI 32.12 kg/m   Physical Exam  Constitutional: She is oriented to person, place, and time. She appears well-developed.  HENT:  Head: Normocephalic.  Right Ear: External ear normal.  Left Ear: External ear normal.  Eyes: Pupils are equal, round, and reactive to light.  Neck: No tracheal deviation present. No thyromegaly present.  Cardiovascular: Normal rate.  Pulmonary/Chest: Effort normal.  Abdominal: Soft.  Neurological: She is alert and oriented to person, place, and time.  Skin: Skin is warm and dry.  Psychiatric: She has a normal mood and affect. Her behavior is normal.    Ortho Exam bilateral knee crepitus with sharp pop.  Bilateral patella alter.  Right knee has transverse incision over the patella.  She has full extension.  She gets up on a step easily with the  right leg than left leg.  Specialty Comments:  No specialty comments available.  Imaging: No results found.   PMFS History: Patient Active Problem List   Diagnosis Date Noted  . Status post total hip replacement, right 11/12/2017  . Bilateral primary osteoarthritis of knee 11/12/2017  . GERD (gastroesophageal reflux disease) 01/17/2017  . Hypothyroidism 01/17/2017  . Vertigo 01/16/2017  . Nausea and vomiting 01/16/2017  . Essential hypertension 01/16/2017  . Fatty liver 01/16/2017  . 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 Pelvic mass 06/15/2015  . Closed wedge compression fracture of fifth lumbar vertebra ([place])   . Cervical spondylosis 02/09/2014  . DDD (degenerative disc disease), lumbar 05/13/2013  . OA (osteoarthritis) 05/13/2013   Past Medical History:  Diagnosis Date  . Arthritis   . Bronchitis    hx of  . Fatty liver   . Fever blister    Takes Acyclovir  . GERD (gastroesophageal reflux disease)   . Hyperlipemia   . Hypothyroidism   . Migraines   . Osteopenia   . Pneumonia    hx of     YRS AGO  . PONV (postoperative nausea and vomiting)   . Seasonal allergies   . Vertigo     Family History  Problem Relation Age of Onset  . Lymphoma Mother     Past Surgical History:  Procedure Laterality Date  . ANTERIOR CERVICAL DECOMP/DISCECTOMY FUSION N/A 02/09/2014   Procedure: ANTERIOR CERVICAL DECOMPRESSION/DISCECTOMY FUSION 2 LEVELS;  Surgeon: [name], MD;  Location: [facility];  Service: Orthopedics;  Laterality: N/A;  C4-5, C5-6 Anterior Cervical Discectomy and Fusion, Allograft, Plate  . BACK SURGERY     lumbar fusion  X2  . CATARACT EXTRACTION W/ INTRAOCULAR LENS  IMPLANT, BILATERAL    . COLONOSCOPY    . GANGLION CYST EXCISION Left 1970s   wrist  . HAND SURGERY  2018  . HIP ARTHROPLASTY Left 2011  . JOINT REPLACEMENT    . KNEE SURGERY Right    3 TOTAL  . ROBOTIC ASSISTED BILATERAL SALPINGO OOPHERECTOMY Bilateral 08/01/2015   Procedure: ROBOTIC  ASSISTED BILATERAL SALPINGO OOPHORECTOMY;  Surgeon: Everitt Amber, MD;  Location[facility];  Service: Gynecology;  Laterality: Bilateral;  . SHOULDER OPEN ROTATOR CUFF REPAIR Right    3 TOTAL  . SHOULDER SURGERY Right   . TOTAL HIP ARTHROPLASTY Right 01/21/2018   Procedure: RIGHT TOTAL HIP ARTHROPLASTY DIRECT ANTERIOR;  Surgeon: [name], MD;  Location: [facility];  Service: Orthopedics;  Laterality: Right;  . TOTAL HIP REVISION Left 06/10/2016   Procedure: TOTAL HIP REVISION ARTHROPLASTY;  Surgeon: Rod Can, MD;  Location: [facility];  Service: Orthopedics;  Laterality: Left;   Social History   Occupational History  . Not on file  Tobacco Use  . Smoking status: Never Smoker  . Smokeless tobacco: Never Used  Substance and Sexual Activity  . Alcohol use: Yes    Comment: rare  . Drug use: No  .  Sexual activity: Not Currently

## 2018-03-20 ENCOUNTER — Other Ambulatory Visit: Payer: Self-pay | Admitting: Orthopaedic Surgery

## 2018-03-23 DIAGNOSIS — E669 Obesity, unspecified: Secondary | ICD-10-CM | POA: Diagnosis not present

## 2018-03-23 DIAGNOSIS — R7303 Prediabetes: Secondary | ICD-10-CM | POA: Diagnosis not present

## 2018-03-23 DIAGNOSIS — I10 Essential (primary) hypertension: Secondary | ICD-10-CM | POA: Diagnosis not present

## 2018-03-23 DIAGNOSIS — E785 Hyperlipidemia, unspecified: Secondary | ICD-10-CM | POA: Diagnosis not present

## 2018-03-23 DIAGNOSIS — E559 Vitamin D deficiency, unspecified: Secondary | ICD-10-CM | POA: Diagnosis not present

## 2018-03-23 DIAGNOSIS — E039 Hypothyroidism, unspecified: Secondary | ICD-10-CM | POA: Diagnosis not present

## 2018-04-21 ENCOUNTER — Encounter (INDEPENDENT_AMBULATORY_CARE_PROVIDER_SITE_OTHER): Payer: Self-pay | Admitting: Orthopaedic Surgery

## 2018-04-21 ENCOUNTER — Ambulatory Visit (INDEPENDENT_AMBULATORY_CARE_PROVIDER_SITE_OTHER): Payer: Medicare Other

## 2018-04-21 ENCOUNTER — Ambulatory Visit (INDEPENDENT_AMBULATORY_CARE_PROVIDER_SITE_OTHER): Payer: Medicare Other | Admitting: Orthopaedic Surgery

## 2018-04-21 VITALS — BP 131/83 | HR 97 | Ht 65.0 in | Wt 193.0 lb

## 2018-04-21 DIAGNOSIS — T5694XA Toxic effect of unspecified metal, undetermined, initial encounter: Secondary | ICD-10-CM | POA: Diagnosis not present

## 2018-04-21 DIAGNOSIS — M7062 Trochanteric bursitis, left hip: Secondary | ICD-10-CM

## 2018-04-21 DIAGNOSIS — M25552 Pain in left hip: Secondary | ICD-10-CM | POA: Diagnosis not present

## 2018-04-21 DIAGNOSIS — Z96642 Presence of left artificial hip joint: Secondary | ICD-10-CM

## 2018-04-21 DIAGNOSIS — Z9889 Other specified postprocedural states: Secondary | ICD-10-CM | POA: Diagnosis not present

## 2018-04-21 DIAGNOSIS — T8451XA Infection and inflammatory reaction due to internal right hip prosthesis, initial encounter: Secondary | ICD-10-CM | POA: Diagnosis not present

## 2018-04-21 DIAGNOSIS — M25452 Effusion, left hip: Secondary | ICD-10-CM | POA: Diagnosis not present

## 2018-04-21 NOTE — H&P (View-Only) (Signed)
Office Visit Note   Patient: Emma Lewis           Date of Birth: June 17, 1951           MRN: [id number] Visit Date: 04/21/2018              Requested by: Philmore Pali, NP [address] [place], [place] [address] PCP: Philmore Pali, NP   Assessment & Plan: Visit Diagnoses:  1. Trochanteric bursitis, left hip   2. History of left hip replacement   3. Pain in left hip   4. Joint effusion of pelvis or thigh, left   5. Metallosis, undetermined intent, initial encounter   6. History of arthroplasty of right hip     Plan: Trochanteric and was performed after removing the needle some black [place]-green drainage occurred through the pinpoint needle hole that is been sterilely prepped.  I proceeded to reprep it and insert 22-gauge needle back in the trochanteric bursa and 10 cc of metal debris was aspirated sent for cultures.  Aspiration is consistent with metallosis of her hip.  Patient has a Government social research officer made by [facility] which was recalled and is no longer manufactured.  She has metallosis which is now extending into the greater trochanter and will require revision surgery removal of the femoral component and likely has damaged polyethylene as well as the severe metallosis.  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  Extensive discussion was held with the patient informed consent was obtained concerning plan surgery for total hip arthroplasty revision.  She will need 2 units of packed cells type and cross for her procedure.  We discussed possibility of requiring a femoral osteotomy to get the stem removed.  Acetabulum will need to be removed as well as the screw which is a Biomet 56 shell with two screws.  She had a modular stem placed at the original procedure which is been recalled due to metal debris problems.  Questions were elicited and answered she requests we proceed.  Aspirate  of her hip Gram stain returned no organisms.  CBC sed rate and CRP drawn and sent today. Follow-Up Instructions: Return if symptoms worsen or fail to improve.   Orders:  Orders Placed This Encounter  Procedures  . Large Joint Inj: L greater trochanter  . Anaerobic and Aerobic Culture  . XR HIP UNILAT W OR W/O PELVIS 2-3 VIEWS LEFT  . Cell count + diff,  w/ cryst-synvl fld  . CBC with Differential  . Sed Rate (ESR)  . CRP High sensitivity   No orders of the defined types were placed in this encounter.     Procedures: Large Joint Inj: L greater trochanter on 04/21/2018 2:55 PM Medications: 2 mL bupivacaine 0.25 %; 0.5 mL lidocaine 1 %; 40 mg methylPREDNISolone acetate 40 MG/ML Aspirate: 10 mL cloudy; sent for lab analysis   aspirate was [place]/green metallosis color. No purulence.    Clinical Data: No additional findings.   Subjective: Chief Complaint  Patient presents with  . Right Hip - Follow-up    01/21/18 Right THA  . Left Hip - Pain    HPI 67 year old female returns post right total hip arthroplasty done 01/21/2018 doing well with good relief of pain.  She states she is having more pain actually on her left hip with aching pain that bothers her.  She has difficulty getting comfortable  has to sleep in recliner.  At times she feels some increased pain and she describes it is pressure in her hip.  Patient originally had a left total hip arthroplasty done by Dr.Jenna at [facility] in 2011.  She had increased pain in her hip with revision surgery by Dr. Lyla Glassing November 2017 for a broken polyliner ostial lysis and metal debris.  At that procedure he removed a [place] 52 mm cup and polyliner and placed a size 56 Biomet OsseoTi acetabular shell with two screws.  36 mm ball +3.5 metal head. Her ProFemur stem was left in.  Today she came in with lateral hip pain and requested an injection in her trochanter due to continued aching and pain with ambulation.  She is  very happy with a right total hip arthroplasty 01/21/2018.  Review of Systems positive for left total hip arthroplasty with revision surgery as above.  Right total hip arthroplasty doing well.  Previous robotic salpingo-oophorectomy 2017.  Previous cervical fusion.  Hypertension hyperlipidemia hypothyroidism, GERD.  Previous lumbar epidural Dr. Assunta Curtis April 2019.  L5 compression fracture.  Cervical spondylosis.   Objective: Vital Signs: BP 131/83   Pulse 97   Ht 5' 5"  (1.651 m)   Wt 193 lb (87.5 kg)   BMI 32.12 kg/m   Physical Exam  Constitutional: She is oriented to person, place, and time. She appears well-developed.  HENT:  Head: Normocephalic.  Right Ear: External ear normal.  Left Ear: External ear normal.  Eyes: Pupils are equal, round, and reactive to light.  Neck: No tracheal deviation present. No thyromegaly present.  Cardiovascular: Normal rate.  Pulmonary/Chest: Effort normal.  Abdominal: Soft.  Neurological: She is alert and oriented to person, place, and time.  Skin: Skin is warm and dry.  Psychiatric: She has a normal mood and affect. Her behavior is normal.    Ortho Exam patient ambulates with a trace left hip limp.  She has tenderness over the greater trochanter.  Internal and external rotation she describes as feeling of tightness in her hip.  Sciatic sensation is intact.  She has a posterior hip incision on the left direct anterior approach on the right both well-healed without erythema or cellulitis.  Specialty Comments:  No specialty comments available.  Imaging: No results found.   PMFS History: Patient Active Problem List   Diagnosis Date Noted  . 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 Joint effusion of pelvis or thigh, left 04/22/2018  . Trochanteric bursitis, left hip 04/22/2018  . History of left hip replacement 11/12/2017  . Bilateral primary osteoarthritis of knee 11/12/2017  . GERD (gastroesophageal reflux disease) 01/17/2017  . Hypothyroidism 01/17/2017  .  Vertigo 01/16/2017  . Nausea and vomiting 01/16/2017  . Essential hypertension 01/16/2017  . Fatty liver 01/16/2017  . Failed total hip arthroplasty ([place]) 06/10/2016  . Pelvic mass 06/15/2015  . Closed wedge compression fracture of fifth lumbar vertebra ([place])   . Cervical spondylosis 02/09/2014  . DDD (degenerative disc disease), lumbar 05/13/2013  . OA (osteoarthritis) 05/13/2013   Past Medical History:  Diagnosis Date  . Arthritis   . Bronchitis    hx of  . Fatty liver   . Fever blister    Takes Acyclovir  . GERD (gastroesophageal reflux disease)   . Hyperlipemia   . Hypothyroidism   . Migraines   . Osteopenia   . Pneumonia    hx of     YRS AGO  . PONV (postoperative nausea and vomiting)   . Seasonal  allergies   . Vertigo     Family History  Problem Relation Age of Onset  . Lymphoma Mother     Past Surgical History:  Procedure Laterality Date  . ANTERIOR CERVICAL DECOMP/DISCECTOMY FUSION N/A 02/09/2014   Procedure: ANTERIOR CERVICAL DECOMPRESSION/DISCECTOMY FUSION 2 LEVELS;  Surgeon: [name], MD;  Location: [facility] [place];  Service: Orthopedics;  Laterality: N/A;  C4-5, C5-6 Anterior Cervical Discectomy and Fusion, Allograft, Plate  . BACK SURGERY     lumbar fusion  X2  . CATARACT EXTRACTION W/ INTRAOCULAR LENS  IMPLANT, BILATERAL    . COLONOSCOPY    . GANGLION CYST EXCISION Left 1970s   wrist  . HAND SURGERY  2018  . HIP ARTHROPLASTY Left 2011  . JOINT REPLACEMENT    . KNEE SURGERY Right    3 TOTAL  . ROBOTIC ASSISTED BILATERAL SALPINGO OOPHERECTOMY Bilateral 08/01/2015   Procedure: ROBOTIC ASSISTED BILATERAL SALPINGO OOPHORECTOMY;  Surgeon: Everitt Amber, MD;  [facility];  Service: Gynecology;  Laterality: Bilateral;  . SHOULDER OPEN ROTATOR CUFF REPAIR Right    3 TOTAL  . SHOULDER SURGERY Right   . TOTAL HIP ARTHROPLASTY Right 01/21/2018   Procedure: RIGHT TOTAL HIP ARTHROPLASTY DIRECT ANTERIOR;  Surgeon: [name], MD;  Location: [place];  Service:  Orthopedics;  Laterality: Right;  . TOTAL HIP REVISION Left 06/10/2016   Procedure: TOTAL HIP REVISION ARTHROPLASTY;  Surgeon: Rod Can, MD;  Location: [place];  Service: Orthopedics;  Laterality: Left;   Social History   Occupational History  . Not on file  Tobacco Use  . Smoking status: Never Smoker  . Smokeless tobacco: Never Used  Substance and Sexual Activity  . Alcohol use: Yes    Comment: rare  . Drug use: No  . Sexual activity: Not Currently

## 2018-04-21 NOTE — Progress Notes (Addendum)
Office Visit Note   Patient: Emma Lewis           Date of Birth: 03-Aug-1950           MRN: [id number] Visit Date: 04/21/2018              Requested by: Philmore Pali, NP [address] [place], [place] [address] PCP: Philmore Pali, NP   Assessment & Plan: Visit Diagnoses:  1. Trochanteric bursitis, left hip   2. History of left hip replacement   3. Pain in left hip   4. Joint effusion of pelvis or thigh, left   5. Metallosis, undetermined intent, initial encounter   6. History of arthroplasty of right hip     Plan: Trochanteric and was performed after removing the needle some black [place]-green drainage occurred through the pinpoint needle hole that is been sterilely prepped.  I proceeded to reprep it and insert 22-gauge needle back in the trochanteric bursa and 10 cc of metal debris was aspirated sent for cultures.  Aspiration is consistent with metallosis of her hip.  Patient has a Government social research officer made by [facility] which was recalled and is no longer manufactured.  She has metallosis which is now extending into the greater trochanter and will require revision surgery removal of the femoral component and likely has damaged polyethylene as well as the severe metallosis.  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  Extensive discussion was held with the patient informed consent was obtained concerning plan surgery for total hip arthroplasty revision.  She will need 2 units of packed cells type and cross for her procedure.  We discussed possibility of requiring a femoral osteotomy to get the stem removed.  Acetabulum will need to be removed as well as the screw which is a Biomet 56 shell with two screws.  She had a modular stem placed at the original procedure which is been recalled due to metal debris problems.  Questions were elicited and answered she requests we proceed.  Aspirate  of her hip Gram stain returned no organisms.  CBC sed rate and CRP drawn and sent today. Follow-Up Instructions: Return if symptoms worsen or fail to improve.   Orders:  Orders Placed This Encounter  Procedures  . Large Joint Inj: L greater trochanter  . Anaerobic and Aerobic Culture  . XR HIP UNILAT W OR W/O PELVIS 2-3 VIEWS LEFT  . Cell count + diff,  w/ cryst-synvl fld  . CBC with Differential  . Sed Rate (ESR)  . CRP High sensitivity   No orders of the defined types were placed in this encounter.     Procedures: Large Joint Inj: L greater trochanter on 04/21/2018 2:55 PM Medications: 2 mL bupivacaine 0.25 %; 0.5 mL lidocaine 1 %; 40 mg methylPREDNISolone acetate 40 MG/ML Aspirate: 10 mL cloudy; sent for lab analysis   aspirate was [place]/green metallosis color. No purulence.    Clinical Data: No additional findings.   Subjective: Chief Complaint  Patient presents with  . Right Hip - Follow-up    01/21/18 Right THA  . Left Hip - Pain    HPI 67 year old female returns post right total hip arthroplasty done 01/21/2018 doing well with good relief of pain.  She states she is having more pain actually on her left hip with aching pain that bothers her.  She has difficulty getting comfortable  has to sleep in recliner.  At times she feels some increased pain and she describes it is pressure in her hip.  Patient originally had a left total hip arthroplasty done by Dr.Jenna at [facility] in 2011.  She had increased pain in her hip with revision surgery by Dr. Lyla Glassing November 2017 for a broken polyliner ostial lysis and metal debris.  At that procedure he removed a [place] 52 mm cup and polyliner and placed a size 56 Biomet OsseoTi acetabular shell with two screws.  36 mm ball +3.5 metal head. Her ProFemur stem was left in.  Today she came in with lateral hip pain and requested an injection in her trochanter due to continued aching and pain with ambulation.  She is  very happy with a right total hip arthroplasty 01/21/2018.  Review of Systems positive for left total hip arthroplasty with revision surgery as above.  Right total hip arthroplasty doing well.  Previous robotic salpingo-oophorectomy 2017.  Previous cervical fusion.  Hypertension hyperlipidemia hypothyroidism, GERD.  Previous lumbar epidural Dr. Assunta Curtis April 2019.  L5 compression fracture.  Cervical spondylosis.   Objective: Vital Signs: BP 131/83   Pulse 97   Ht 5' 5"  (1.651 m)   Wt 193 lb (87.5 kg)   BMI 32.12 kg/m   Physical Exam  Constitutional: She is oriented to person, place, and time. She appears well-developed.  HENT:  Head: Normocephalic.  Right Ear: External ear normal.  Left Ear: External ear normal.  Eyes: Pupils are equal, round, and reactive to light.  Neck: No tracheal deviation present. No thyromegaly present.  Cardiovascular: Normal rate.  Pulmonary/Chest: Effort normal.  Abdominal: Soft.  Neurological: She is alert and oriented to person, place, and time.  Skin: Skin is warm and dry.  Psychiatric: She has a normal mood and affect. Her behavior is normal.    Ortho Exam patient ambulates with a trace left hip limp.  She has tenderness over the greater trochanter.  Internal and external rotation she describes as feeling of tightness in her hip.  Sciatic sensation is intact.  She has a posterior hip incision on the left direct anterior approach on the right both well-healed without erythema or cellulitis.  Specialty Comments:  No specialty comments available.  Imaging: No results found.   PMFS History: Patient Active Problem List   Diagnosis Date Noted  . 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 Joint effusion of pelvis or thigh, left 04/22/2018  . Trochanteric bursitis, left hip 04/22/2018  . History of left hip replacement 11/12/2017  . Bilateral primary osteoarthritis of knee 11/12/2017  . GERD (gastroesophageal reflux disease) 01/17/2017  . Hypothyroidism 01/17/2017  .  Vertigo 01/16/2017  . Nausea and vomiting 01/16/2017  . Essential hypertension 01/16/2017  . Fatty liver 01/16/2017  . Failed total hip arthroplasty ([place]) 06/10/2016  . Pelvic mass 06/15/2015  . Closed wedge compression fracture of fifth lumbar vertebra ([place])   . Cervical spondylosis 02/09/2014  . DDD (degenerative disc disease), lumbar 05/13/2013  . OA (osteoarthritis) 05/13/2013   Past Medical History:  Diagnosis Date  . Arthritis   . Bronchitis    hx of  . Fatty liver   . Fever blister    Takes Acyclovir  . GERD (gastroesophageal reflux disease)   . Hyperlipemia   . Hypothyroidism   . Migraines   . Osteopenia   . Pneumonia    hx of     YRS AGO  . PONV (postoperative nausea and vomiting)   . Seasonal  allergies   . Vertigo     Family History  Problem Relation Age of Onset  . Lymphoma Mother     Past Surgical History:  Procedure Laterality Date  . ANTERIOR CERVICAL DECOMP/DISCECTOMY FUSION N/A 02/09/2014   Procedure: ANTERIOR CERVICAL DECOMPRESSION/DISCECTOMY FUSION 2 LEVELS;  Surgeon: [name], MD;  Location: [facility];  Service: Orthopedics;  Laterality: N/A;  C4-5, C5-6 Anterior Cervical Discectomy and Fusion, Allograft, Plate  . BACK SURGERY     lumbar fusion  X2  . CATARACT EXTRACTION W/ INTRAOCULAR LENS  IMPLANT, BILATERAL    . COLONOSCOPY    . GANGLION CYST EXCISION Left 1970s   wrist  . HAND SURGERY  2018  . HIP ARTHROPLASTY Left 2011  . JOINT REPLACEMENT    . KNEE SURGERY Right    3 TOTAL  . ROBOTIC ASSISTED BILATERAL SALPINGO OOPHERECTOMY Bilateral 08/01/2015   Procedure: ROBOTIC ASSISTED BILATERAL SALPINGO OOPHORECTOMY;  Surgeon: Everitt Amber, MD;  [facility];  Service: Gynecology;  Laterality: Bilateral;  . SHOULDER OPEN ROTATOR CUFF REPAIR Right    3 TOTAL  . SHOULDER SURGERY Right   . TOTAL HIP ARTHROPLASTY Right 01/21/2018   Procedure: RIGHT TOTAL HIP ARTHROPLASTY DIRECT ANTERIOR;  Surgeon: [name], MD;  Location: [place];  Service:  Orthopedics;  Laterality: Right;  . TOTAL HIP REVISION Left 06/10/2016   Procedure: TOTAL HIP REVISION ARTHROPLASTY;  Surgeon: Rod Can, MD;  Location: [place];  Service: Orthopedics;  Laterality: Left;   Social History   Occupational History  . Not on file  Tobacco Use  . Smoking status: Never Smoker  . Smokeless tobacco: Never Used  Substance and Sexual Activity  . Alcohol use: Yes    Comment: rare  . Drug use: No  . Sexual activity: Not Currently

## 2018-04-22 ENCOUNTER — Encounter (INDEPENDENT_AMBULATORY_CARE_PROVIDER_SITE_OTHER): Payer: Self-pay | Admitting: Orthopaedic Surgery

## 2018-04-22 DIAGNOSIS — M7062 Trochanteric bursitis, left hip: Secondary | ICD-10-CM | POA: Insufficient documentation

## 2018-04-22 DIAGNOSIS — T5691XA Toxic effect of unspecified metal, accidental (unintentional), initial encounter: Secondary | ICD-10-CM | POA: Insufficient documentation

## 2018-04-22 DIAGNOSIS — M25452 Effusion, left hip: Secondary | ICD-10-CM | POA: Insufficient documentation

## 2018-04-22 MED ORDER — BUPIVACAINE HCL 0.25 % IJ SOLN
2.0000 mL | INTRAMUSCULAR | Status: AC | PRN
  Administered 2018-04-21: 2 mL via INTRA_ARTICULAR

## 2018-04-22 MED ORDER — LIDOCAINE HCL 1 % IJ SOLN
0.5000 mL | INTRAMUSCULAR | Status: AC | PRN
  Administered 2018-04-21: .5 mL

## 2018-04-22 MED ORDER — METHYLPREDNISOLONE ACETATE 40 MG/ML IJ SUSP
40.0000 mg | INTRAMUSCULAR | Status: AC | PRN
  Administered 2018-04-21: 40 mg via INTRA_ARTICULAR

## 2018-04-23 LAB — CBC WITH DIFFERENTIAL/PLATELET
BASOS PCT: 0.6 %
Basophils Absolute: 43 cells/uL (ref 0–200)
EOS ABS: 192 {cells}/uL (ref 15–500)
EOS PCT: 2.7 %
HCT: 39.2 % (ref 35.0–45.0)
HEMOGLOBIN: 12.9 g/dL (ref 11.7–15.5)
Lymphs Abs: 2308 cells/uL (ref 850–3900)
MCH: 30.4 pg (ref 27.0–33.0)
MCHC: 32.9 g/dL (ref 32.0–36.0)
MCV: 92.2 fL (ref 80.0–100.0)
MONOS PCT: 10.9 %
MPV: 11.2 fL (ref 7.5–12.5)
NEUTROS ABS: 3784 {cells}/uL (ref 1500–7800)
Neutrophils Relative %: 53.3 %
Platelets: 279 10*3/uL (ref 140–400)
RBC: 4.25 10*6/uL (ref 3.80–5.10)
RDW: 13.2 % (ref 11.0–15.0)
Total Lymphocyte: 32.5 %
WBC mixed population: 774 cells/uL (ref 200–950)
WBC: 7.1 10*3/uL (ref 3.8–10.8)

## 2018-04-23 LAB — HIGH SENSITIVITY CRP: HS-CRP: 7.4 mg/L — AB

## 2018-04-23 LAB — SEDIMENTATION RATE: SED RATE: 51 mm/h — AB (ref 0–30)

## 2018-04-24 ENCOUNTER — Other Ambulatory Visit: Payer: Self-pay | Admitting: Orthopaedic Surgery

## 2018-04-27 ENCOUNTER — Encounter (HOSPITAL_COMMUNITY): Payer: Self-pay

## 2018-04-28 LAB — ANAEROBIC AND AEROBIC CULTURE
AER RESULT:: NO GROWTH
MICRO NUMBER:: 91147599
MICRO NUMBER:: 91147600
SPECIMEN QUALITY:: ADEQUATE
SPECIMEN QUALITY:: ADEQUATE

## 2018-04-28 LAB — SYNOVIAL CELL COUNT + DIFF, W/ CRYSTALS

## 2018-04-29 ENCOUNTER — Encounter (HOSPITAL_COMMUNITY)
Admission: RE | Disposition: A | Discharge status: Rehabilitation Facility | Payer: Self-pay | Source: Home / Self Care | Attending: Orthopaedic Surgery

## 2018-04-29 ENCOUNTER — Encounter (HOSPITAL_COMMUNITY): Payer: Self-pay | Admitting: *Deleted

## 2018-04-29 ENCOUNTER — Inpatient Hospital Stay (HOSPITAL_COMMUNITY): Payer: Medicare Other

## 2018-04-29 ENCOUNTER — Inpatient Hospital Stay (HOSPITAL_COMMUNITY): Payer: Medicare Other | Admitting: Anesthesiology

## 2018-04-29 ENCOUNTER — Other Ambulatory Visit: Payer: Self-pay

## 2018-04-29 ENCOUNTER — Inpatient Hospital Stay (HOSPITAL_COMMUNITY)
Admission: RE | Admit: 2018-04-29 | Discharge: 2018-05-01 | DRG: 467 | Disposition: A | Discharge status: Rehabilitation Facility | Payer: Medicare Other | Attending: Orthopaedic Surgery | Admitting: Orthopaedic Surgery

## 2018-04-29 DIAGNOSIS — T84011A Broken internal left hip prosthesis, initial encounter: Secondary | ICD-10-CM | POA: Diagnosis not present

## 2018-04-29 DIAGNOSIS — I10 Essential (primary) hypertension: Secondary | ICD-10-CM | POA: Diagnosis present

## 2018-04-29 DIAGNOSIS — M7989 Other specified soft tissue disorders: Secondary | ICD-10-CM | POA: Diagnosis not present

## 2018-04-29 DIAGNOSIS — Z96642 Presence of left artificial hip joint: Secondary | ICD-10-CM

## 2018-04-29 DIAGNOSIS — E785 Hyperlipidemia, unspecified: Secondary | ICD-10-CM | POA: Diagnosis present

## 2018-04-29 DIAGNOSIS — Z7952 Long term (current) use of systemic steroids: Secondary | ICD-10-CM

## 2018-04-29 DIAGNOSIS — Z79890 Hormone replacement therapy (postmenopausal): Secondary | ICD-10-CM

## 2018-04-29 DIAGNOSIS — Y792 Prosthetic and other implants, materials and accessory orthopedic devices associated with adverse incidents: Secondary | ICD-10-CM | POA: Diagnosis present

## 2018-04-29 DIAGNOSIS — T84018A Broken internal joint prosthesis, other site, initial encounter: Secondary | ICD-10-CM

## 2018-04-29 DIAGNOSIS — Z96641 Presence of right artificial hip joint: Secondary | ICD-10-CM | POA: Diagnosis present

## 2018-04-29 DIAGNOSIS — Z96649 Presence of unspecified artificial hip joint: Secondary | ICD-10-CM

## 2018-04-29 DIAGNOSIS — K760 Fatty (change of) liver, not elsewhere classified: Secondary | ICD-10-CM | POA: Diagnosis present

## 2018-04-29 DIAGNOSIS — Z807 Family history of other malignant neoplasms of lymphoid, hematopoietic and related tissues: Secondary | ICD-10-CM | POA: Diagnosis not present

## 2018-04-29 DIAGNOSIS — Z9842 Cataract extraction status, left eye: Secondary | ICD-10-CM | POA: Diagnosis not present

## 2018-04-29 DIAGNOSIS — Z471 Aftercare following joint replacement surgery: Secondary | ICD-10-CM | POA: Diagnosis not present

## 2018-04-29 DIAGNOSIS — M62838 Other muscle spasm: Secondary | ICD-10-CM | POA: Diagnosis not present

## 2018-04-29 DIAGNOSIS — K219 Gastro-esophageal reflux disease without esophagitis: Secondary | ICD-10-CM | POA: Diagnosis present

## 2018-04-29 DIAGNOSIS — R29898 Other symptoms and signs involving the musculoskeletal system: Secondary | ICD-10-CM | POA: Diagnosis not present

## 2018-04-29 DIAGNOSIS — Z90721 Acquired absence of ovaries, unilateral: Secondary | ICD-10-CM | POA: Diagnosis not present

## 2018-04-29 DIAGNOSIS — T84031A Mechanical loosening of internal left hip prosthetic joint, initial encounter: Principal | ICD-10-CM | POA: Diagnosis present

## 2018-04-29 DIAGNOSIS — M170 Bilateral primary osteoarthritis of knee: Secondary | ICD-10-CM | POA: Diagnosis present

## 2018-04-29 DIAGNOSIS — R7303 Prediabetes: Secondary | ICD-10-CM | POA: Diagnosis present

## 2018-04-29 DIAGNOSIS — Z9079 Acquired absence of other genital organ(s): Secondary | ICD-10-CM | POA: Diagnosis not present

## 2018-04-29 DIAGNOSIS — Z09 Encounter for follow-up examination after completed treatment for conditions other than malignant neoplasm: Secondary | ICD-10-CM

## 2018-04-29 DIAGNOSIS — M1712 Unilateral primary osteoarthritis, left knee: Secondary | ICD-10-CM | POA: Diagnosis not present

## 2018-04-29 DIAGNOSIS — Z981 Arthrodesis status: Secondary | ICD-10-CM

## 2018-04-29 DIAGNOSIS — E46 Unspecified protein-calorie malnutrition: Secondary | ICD-10-CM | POA: Diagnosis not present

## 2018-04-29 DIAGNOSIS — G8918 Other acute postprocedural pain: Secondary | ICD-10-CM | POA: Diagnosis not present

## 2018-04-29 DIAGNOSIS — Z9841 Cataract extraction status, right eye: Secondary | ICD-10-CM | POA: Diagnosis not present

## 2018-04-29 DIAGNOSIS — Z96622 Presence of left artificial elbow joint: Secondary | ICD-10-CM | POA: Diagnosis not present

## 2018-04-29 DIAGNOSIS — M1711 Unilateral primary osteoarthritis, right knee: Secondary | ICD-10-CM | POA: Diagnosis not present

## 2018-04-29 DIAGNOSIS — Z419 Encounter for procedure for purposes other than remedying health state, unspecified: Secondary | ICD-10-CM

## 2018-04-29 DIAGNOSIS — E039 Hypothyroidism, unspecified: Secondary | ICD-10-CM | POA: Diagnosis present

## 2018-04-29 DIAGNOSIS — Z96643 Presence of artificial hip joint, bilateral: Secondary | ICD-10-CM | POA: Diagnosis present

## 2018-04-29 DIAGNOSIS — Z79899 Other long term (current) drug therapy: Secondary | ICD-10-CM | POA: Diagnosis not present

## 2018-04-29 DIAGNOSIS — J302 Other seasonal allergic rhinitis: Secondary | ICD-10-CM | POA: Diagnosis present

## 2018-04-29 DIAGNOSIS — D62 Acute posthemorrhagic anemia: Secondary | ICD-10-CM | POA: Diagnosis not present

## 2018-04-29 DIAGNOSIS — T8484XA Pain due to internal orthopedic prosthetic devices, implants and grafts, initial encounter: Secondary | ICD-10-CM | POA: Diagnosis not present

## 2018-04-29 DIAGNOSIS — Z888 Allergy status to other drugs, medicaments and biological substances status: Secondary | ICD-10-CM | POA: Diagnosis not present

## 2018-04-29 DIAGNOSIS — E8809 Other disorders of plasma-protein metabolism, not elsewhere classified: Secondary | ICD-10-CM | POA: Diagnosis present

## 2018-04-29 DIAGNOSIS — Z882 Allergy status to sulfonamides status: Secondary | ICD-10-CM | POA: Diagnosis not present

## 2018-04-29 DIAGNOSIS — Z01818 Encounter for other preprocedural examination: Secondary | ICD-10-CM

## 2018-04-29 DIAGNOSIS — D72829 Elevated white blood cell count, unspecified: Secondary | ICD-10-CM | POA: Diagnosis not present

## 2018-04-29 DIAGNOSIS — Z961 Presence of intraocular lens: Secondary | ICD-10-CM | POA: Diagnosis present

## 2018-04-29 DIAGNOSIS — K5900 Constipation, unspecified: Secondary | ICD-10-CM | POA: Diagnosis present

## 2018-04-29 DIAGNOSIS — M79609 Pain in unspecified limb: Secondary | ICD-10-CM | POA: Diagnosis not present

## 2018-04-29 DIAGNOSIS — T84011S Broken internal left hip prosthesis, sequela: Secondary | ICD-10-CM | POA: Diagnosis not present

## 2018-04-29 DIAGNOSIS — R740 Nonspecific elevation of levels of transaminase and lactic acid dehydrogenase [LDH]: Secondary | ICD-10-CM | POA: Diagnosis not present

## 2018-04-29 DIAGNOSIS — J984 Other disorders of lung: Secondary | ICD-10-CM | POA: Diagnosis not present

## 2018-04-29 HISTORY — DX: Low back pain, unspecified: M54.50

## 2018-04-29 HISTORY — PX: TOTAL HIP REVISION: SHX763

## 2018-04-29 HISTORY — PX: REVISION TOTAL HIP ARTHROPLASTY: SHX766

## 2018-04-29 HISTORY — DX: Prediabetes: R73.03

## 2018-04-29 HISTORY — DX: Other chronic pain: G89.29

## 2018-04-29 HISTORY — DX: Low back pain: M54.5

## 2018-04-29 LAB — URINALYSIS, ROUTINE W REFLEX MICROSCOPIC
Bilirubin Urine: NEGATIVE
Glucose, UA: NEGATIVE mg/dL
Hgb urine dipstick: NEGATIVE
Ketones, ur: NEGATIVE mg/dL
Leukocytes, UA: NEGATIVE
NITRITE: NEGATIVE
Protein, ur: NEGATIVE mg/dL
SPECIFIC GRAVITY, URINE: 1.01 (ref 1.005–1.030)
pH: 6 (ref 5.0–8.0)

## 2018-04-29 LAB — COMPREHENSIVE METABOLIC PANEL
ALT: 99 U/L — ABNORMAL HIGH (ref 0–44)
ANION GAP: 11 (ref 5–15)
AST: 99 U/L — ABNORMAL HIGH (ref 15–41)
Albumin: 3.8 g/dL (ref 3.5–5.0)
Alkaline Phosphatase: 242 U/L — ABNORMAL HIGH (ref 38–126)
BUN: 15 mg/dL (ref 8–23)
CO2: 22 mmol/L (ref 22–32)
Calcium: 10 mg/dL (ref 8.9–10.3)
Chloride: 106 mmol/L (ref 98–111)
Creatinine, Ser: 0.72 mg/dL (ref 0.44–1.00)
Glucose, Bld: 87 mg/dL (ref 70–99)
POTASSIUM: 4 mmol/L (ref 3.5–5.1)
Sodium: 139 mmol/L (ref 135–145)
Total Bilirubin: 0.7 mg/dL (ref 0.3–1.2)
Total Protein: 7.5 g/dL (ref 6.5–8.1)

## 2018-04-29 LAB — SURGICAL PCR SCREEN
MRSA, PCR: NEGATIVE
STAPHYLOCOCCUS AUREUS: NEGATIVE

## 2018-04-29 LAB — PREPARE RBC (CROSSMATCH)

## 2018-04-29 SURGERY — TOTAL HIP REVISION
Anesthesia: General | Site: Hip | Laterality: Left

## 2018-04-29 MED ORDER — CHLORHEXIDINE GLUCONATE 4 % EX LIQD: 60.0000 mL | Freq: Once | CUTANEOUS | Status: DC

## 2018-04-29 MED ORDER — SUGAMMADEX SODIUM 200 MG/2ML IV SOLN
INTRAVENOUS | Status: DC | PRN
  Administered 2018-04-29: 175 mg via INTRAVENOUS

## 2018-04-29 MED ORDER — POLYETHYLENE GLYCOL 3350 17 G PO PACK: 17.0000 g | PACK | Freq: Every day | ORAL | Status: DC | PRN

## 2018-04-29 MED ORDER — PHENOL 1.4 % MT LIQD: 1.0000 | OROMUCOSAL | Status: DC | PRN

## 2018-04-29 MED ORDER — ACETAMINOPHEN 325 MG PO TABS: 325.0000 mg | ORAL_TABLET | Freq: Four times a day (QID) | ORAL | Status: DC | PRN

## 2018-04-29 MED ORDER — METOCLOPRAMIDE HCL 5 MG PO TABS: 5.0000 mg | ORAL_TABLET | Freq: Three times a day (TID) | ORAL | Status: DC | PRN

## 2018-04-29 MED ORDER — HYDROMORPHONE HCL 1 MG/ML IJ SOLN
0.5000 mg | INTRAMUSCULAR | Status: DC | PRN
  Administered 2018-04-29: 0.5 mg via INTRAVENOUS
  Filled 2018-04-29: qty 1

## 2018-04-29 MED ORDER — PROPOFOL 10 MG/ML IV BOLUS
INTRAVENOUS | Status: DC | PRN
  Administered 2018-04-29: 140 mg via INTRAVENOUS

## 2018-04-29 MED ORDER — HYDROMORPHONE HCL 1 MG/ML IJ SOLN
INTRAMUSCULAR | Status: AC
  Filled 2018-04-29: qty 0.5

## 2018-04-29 MED ORDER — LIDOCAINE HCL (CARDIAC) PF 100 MG/5ML IV SOSY
PREFILLED_SYRINGE | INTRAVENOUS | Status: DC | PRN
  Administered 2018-04-29: 60 mg via INTRATRACHEAL

## 2018-04-29 MED ORDER — ALENDRONATE SODIUM 70 MG PO TABS: 70.0000 mg | ORAL_TABLET | ORAL | Status: DC

## 2018-04-29 MED ORDER — LACTATED RINGERS IV SOLN
INTRAVENOUS | Status: DC | PRN
  Administered 2018-04-29 (×2): via INTRAVENOUS

## 2018-04-29 MED ORDER — MENTHOL 3 MG MT LOZG: 1.0000 | LOZENGE | OROMUCOSAL | Status: DC | PRN

## 2018-04-29 MED ORDER — FENTANYL CITRATE (PF) 100 MCG/2ML IJ SOLN
INTRAMUSCULAR | Status: AC
Start: 2018-04-29 — End: 2018-04-30
  Filled 2018-04-29: qty 2

## 2018-04-29 MED ORDER — FENTANYL CITRATE (PF) 250 MCG/5ML IJ SOLN
INTRAMUSCULAR | Status: AC
  Filled 2018-04-29: qty 5

## 2018-04-29 MED ORDER — ALBUMIN HUMAN 5 % IV SOLN
INTRAVENOUS | Status: DC | PRN
  Administered 2018-04-29: 14:00:00 via INTRAVENOUS

## 2018-04-29 MED ORDER — VITAMIN D 1000 UNITS PO TABS
2000.0000 [IU] | ORAL_TABLET | Freq: Two times a day (BID) | ORAL | Status: DC
  Administered 2018-04-29 – 2018-05-01 (×4): 2000 [IU] via ORAL
  Filled 2018-04-29 (×5): qty 2

## 2018-04-29 MED ORDER — LACTATED RINGERS IV SOLN
INTRAVENOUS | Status: DC
  Administered 2018-04-29 (×2): via INTRAVENOUS

## 2018-04-29 MED ORDER — PANTOPRAZOLE SODIUM 40 MG PO TBEC
40.0000 mg | DELAYED_RELEASE_TABLET | Freq: Every day | ORAL | Status: DC
  Administered 2018-04-30 – 2018-05-01 (×2): 40 mg via ORAL
  Filled 2018-04-29 (×2): qty 1

## 2018-04-29 MED ORDER — CEFAZOLIN SODIUM-DEXTROSE 2-4 GM/100ML-% IV SOLN
2.0000 g | INTRAVENOUS | Status: AC
  Administered 2018-04-29: 2 g via INTRAVENOUS

## 2018-04-29 MED ORDER — METOCLOPRAMIDE HCL 5 MG/ML IJ SOLN: 5.0000 mg | Freq: Three times a day (TID) | INTRAMUSCULAR | Status: DC | PRN

## 2018-04-29 MED ORDER — ONDANSETRON HCL 4 MG/2ML IJ SOLN
4.0000 mg | Freq: Four times a day (QID) | INTRAMUSCULAR | Status: DC | PRN
  Filled 2018-04-29: qty 2

## 2018-04-29 MED ORDER — 0.9 % SODIUM CHLORIDE (POUR BTL) OPTIME
TOPICAL | Status: DC | PRN
  Administered 2018-04-29: 1000 mL

## 2018-04-29 MED ORDER — FENTANYL CITRATE (PF) 100 MCG/2ML IJ SOLN
INTRAMUSCULAR | Status: DC | PRN
  Administered 2018-04-29 (×2): 50 ug via INTRAVENOUS
  Administered 2018-04-29: 100 ug via INTRAVENOUS
  Administered 2018-04-29: 50 ug via INTRAVENOUS

## 2018-04-29 MED ORDER — ASPIRIN EC 325 MG PO TBEC
325.0000 mg | DELAYED_RELEASE_TABLET | Freq: Every day | ORAL | Status: DC
  Administered 2018-04-30 – 2018-05-01 (×2): 325 mg via ORAL
  Filled 2018-04-29 (×2): qty 1

## 2018-04-29 MED ORDER — PROPOFOL 10 MG/ML IV BOLUS
INTRAVENOUS | Status: AC
  Filled 2018-04-29: qty 20

## 2018-04-29 MED ORDER — POLYVINYL ALCOHOL 1.4 % OP SOLN
1.0000 [drp] | OPHTHALMIC | Status: DC | PRN
  Filled 2018-04-29: qty 15

## 2018-04-29 MED ORDER — ROCURONIUM BROMIDE 100 MG/10ML IV SOLN
INTRAVENOUS | Status: DC | PRN
  Administered 2018-04-29: 50 mg via INTRAVENOUS
  Administered 2018-04-29: 30 mg via INTRAVENOUS
  Administered 2018-04-29: 40 mg via INTRAVENOUS

## 2018-04-29 MED ORDER — ONDANSETRON HCL 4 MG/2ML IJ SOLN
INTRAMUSCULAR | Status: DC | PRN
  Administered 2018-04-29: 4 mg via INTRAVENOUS

## 2018-04-29 MED ORDER — HYDROMORPHONE HCL 1 MG/ML IJ SOLN
INTRAMUSCULAR | Status: DC | PRN
  Administered 2018-04-29: 0.5 mg via INTRAVENOUS

## 2018-04-29 MED ORDER — CARBOXYMETHYLCELLULOSE SODIUM 0.25 % OP SOLN: 1.0000 [drp] | Freq: Two times a day (BID) | OPHTHALMIC | Status: DC | PRN

## 2018-04-29 MED ORDER — DOCUSATE SODIUM 100 MG PO CAPS
100.0000 mg | ORAL_CAPSULE | Freq: Two times a day (BID) | ORAL | Status: DC
  Administered 2018-04-29 – 2018-05-01 (×4): 100 mg via ORAL
  Filled 2018-04-29 (×4): qty 1

## 2018-04-29 MED ORDER — LEVOTHYROXINE SODIUM 100 MCG PO TABS
100.0000 ug | ORAL_TABLET | Freq: Every day | ORAL | Status: DC
  Administered 2018-04-30 – 2018-05-01 (×2): 100 ug via ORAL
  Filled 2018-04-29 (×2): qty 1

## 2018-04-29 MED ORDER — OXYCODONE HCL 5 MG PO TABS
5.0000 mg | ORAL_TABLET | Freq: Four times a day (QID) | ORAL | Status: DC | PRN
  Administered 2018-04-29 – 2018-04-30 (×2): 10 mg via ORAL
  Administered 2018-04-30: 5 mg via ORAL
  Administered 2018-04-30 – 2018-05-01 (×4): 10 mg via ORAL
  Filled 2018-04-29 (×2): qty 2
  Filled 2018-04-29: qty 1
  Filled 2018-04-29 (×4): qty 2

## 2018-04-29 MED ORDER — DEXAMETHASONE SODIUM PHOSPHATE 4 MG/ML IJ SOLN
INTRAMUSCULAR | Status: DC | PRN
  Administered 2018-04-29: 8 mg via INTRAVENOUS

## 2018-04-29 MED ORDER — ROCURONIUM BROMIDE 50 MG/5ML IV SOSY
PREFILLED_SYRINGE | INTRAVENOUS | Status: AC
  Filled 2018-04-29: qty 10

## 2018-04-29 MED ORDER — SODIUM CHLORIDE 0.9 % IV SOLN
INTRAVENOUS | Status: DC
  Administered 2018-04-29: 18:00:00 via INTRAVENOUS

## 2018-04-29 MED ORDER — DEXAMETHASONE SODIUM PHOSPHATE 10 MG/ML IJ SOLN
INTRAMUSCULAR | Status: AC
  Filled 2018-04-29: qty 1

## 2018-04-29 MED ORDER — MECLIZINE HCL 25 MG PO TABS
25.0000 mg | ORAL_TABLET | Freq: Every morning | ORAL | Status: DC
  Administered 2018-04-30 – 2018-05-01 (×2): 25 mg via ORAL
  Filled 2018-04-29 (×2): qty 1

## 2018-04-29 MED ORDER — ONDANSETRON HCL 4 MG PO TABS: 4.0000 mg | ORAL_TABLET | Freq: Four times a day (QID) | ORAL | Status: DC | PRN

## 2018-04-29 MED ORDER — FENTANYL CITRATE (PF) 100 MCG/2ML IJ SOLN
25.0000 ug | INTRAMUSCULAR | Status: DC | PRN
  Administered 2018-04-29: 50 ug via INTRAVENOUS

## 2018-04-29 MED ORDER — ONDANSETRON HCL 4 MG/2ML IJ SOLN
INTRAMUSCULAR | Status: AC
  Filled 2018-04-29: qty 2

## 2018-04-29 MED ORDER — SODIUM CHLORIDE 0.9 % IR SOLN
Status: DC | PRN
  Administered 2018-04-29: 3000 mL

## 2018-04-29 MED ORDER — SUGAMMADEX SODIUM 500 MG/5ML IV SOLN
INTRAVENOUS | Status: AC
  Filled 2018-04-29: qty 5

## 2018-04-29 MED ORDER — CEFAZOLIN SODIUM-DEXTROSE 1-4 GM/50ML-% IV SOLN
1.0000 g | Freq: Three times a day (TID) | INTRAVENOUS | Status: AC
  Administered 2018-04-29 – 2018-04-30 (×2): 1 g via INTRAVENOUS
  Filled 2018-04-29 (×2): qty 50

## 2018-04-29 MED ORDER — SIMVASTATIN 40 MG PO TABS
40.0000 mg | ORAL_TABLET | Freq: Every day | ORAL | Status: DC
Start: 2018-04-29 — End: 2018-05-01
  Administered 2018-04-29 – 2018-04-30 (×2): 40 mg via ORAL
  Filled 2018-04-29 (×2): qty 1

## 2018-04-29 SURGICAL SUPPLY — 80 items
APL SKNCLS STERI-STRIP NONHPOA (GAUZE/BANDAGES/DRESSINGS) ×1
BENZOIN TINCTURE PRP APPL 2/3 (GAUZE/BANDAGES/DRESSINGS) ×3 IMPLANT
BLADE CLIPPER SURG (BLADE) IMPLANT
BLADE SAGITTAL 25.0X1.19X90 (BLADE) ×1 IMPLANT
BLADE SAGITTAL 25.0X1.19X90MM (BLADE) ×1
BONE CANC CHIPS 20CC PCAN1/4 (Bone Implant) ×3 IMPLANT
BRUSH FEMORAL CANAL (MISCELLANEOUS) IMPLANT
CABLE CERLAGE W/CRIMP 1.8 (Cable) ×4 IMPLANT
CABLE CERLAGE W/CRIMP 1.8MM (Cable) ×4 IMPLANT
CHIPS CANC BONE 20CC PCAN1/4 (Bone Implant) ×1 IMPLANT
COVER BACK TABLE 24X17X13 BIG (DRAPES) ×3 IMPLANT
COVER SURGICAL LIGHT HANDLE (MISCELLANEOUS) ×3 IMPLANT
COVER WAND RF STERILE (DRAPES) ×1 IMPLANT
CUP SECTOR GRIPTON 58MM (Orthopedic Implant) ×2 IMPLANT
DRAPE C-ARM 42X72 X-RAY (DRAPES) IMPLANT
DRAPE C-ARMOR (DRAPES) ×2 IMPLANT
DRAPE IMP U-DRAPE 54X76 (DRAPES) ×3 IMPLANT
DRAPE INCISE IOBAN 66X45 STRL (DRAPES) ×2 IMPLANT
DRAPE ORTHO SPLIT 77X108 STRL (DRAPES) ×6
DRAPE SURG ORHT 6 SPLT 77X108 (DRAPES) ×2 IMPLANT
DRAPE U-SHAPE 47X51 STRL (DRAPES) ×3 IMPLANT
DRSG PAD ABDOMINAL 8X10 ST (GAUZE/BANDAGES/DRESSINGS) ×6 IMPLANT
DURAPREP 26ML APPLICATOR (WOUND CARE) ×5 IMPLANT
ELECT CAUTERY BLADE 6.4 (BLADE) ×3 IMPLANT
ELECT REM PT RETURN 9FT ADLT (ELECTROSURGICAL) ×3
ELECTRODE REM PT RTRN 9FT ADLT (ELECTROSURGICAL) ×1 IMPLANT
EVACUATOR 1/8 PVC DRAIN (DRAIN) IMPLANT
FACESHIELD WRAPAROUND (MASK) ×6 IMPLANT
FACESHIELD WRAPAROUND OR TEAM (MASK) ×2 IMPLANT
GAUZE SPONGE 4X4 12PLY STRL (GAUZE/BANDAGES/DRESSINGS) ×3 IMPLANT
GAUZE XEROFORM 5X9 LF (GAUZE/BANDAGES/DRESSINGS) ×3 IMPLANT
GLOVE BIOGEL PI IND STRL 8 (GLOVE) ×2 IMPLANT
GLOVE BIOGEL PI INDICATOR 8 (GLOVE) ×4
GLOVE ORTHO TXT STRL SZ7.5 (GLOVE) ×6 IMPLANT
GOWN STRL REUS W/ TWL LRG LVL3 (GOWN DISPOSABLE) ×1 IMPLANT
GOWN STRL REUS W/ TWL XL LVL3 (GOWN DISPOSABLE) ×1 IMPLANT
GOWN STRL REUS W/TWL 2XL LVL3 (GOWN DISPOSABLE) ×3 IMPLANT
GOWN STRL REUS W/TWL LRG LVL3 (GOWN DISPOSABLE) ×3
GOWN STRL REUS W/TWL XL LVL3 (GOWN DISPOSABLE) ×3
GRAFT BNE CANC CHIPS 1-8 20CC (Bone Implant) IMPLANT
HANDPIECE INTERPULSE COAX TIP (DISPOSABLE) ×3
HEAD M SROM 36MM PLUS 1.5 (Hips) IMPLANT
IMMOBILIZER KNEE 20 (SOFTGOODS) IMPLANT
IMMOBILIZER KNEE 22 UNIV (SOFTGOODS) IMPLANT
IMMOBILIZER KNEE 24 THIGH 36 (MISCELLANEOUS) IMPLANT
IMMOBILIZER KNEE 24 UNIV (MISCELLANEOUS)
KIT BASIN OR (CUSTOM PROCEDURE TRAY) ×3 IMPLANT
KIT TURNOVER KIT B (KITS) ×3 IMPLANT
LINER NEUTRAL 36X58 PLUS4 ×2 IMPLANT
MANIFOLD NEPTUNE II (INSTRUMENTS) ×3 IMPLANT
NDL 1/2 CIR MAYO (NEEDLE) ×1 IMPLANT
NDL HYPO 25GX1X1/2 BEV (NEEDLE) ×1 IMPLANT
NEEDLE 1/2 CIR MAYO (NEEDLE) ×3 IMPLANT
NEEDLE HYPO 25GX1X1/2 BEV (NEEDLE) ×3 IMPLANT
NS IRRIG 1000ML POUR BTL (IV SOLUTION) ×3 IMPLANT
PACK TOTAL JOINT (CUSTOM PROCEDURE TRAY) ×3 IMPLANT
PACK UNIVERSAL I (CUSTOM PROCEDURE TRAY) ×3 IMPLANT
PAD ARMBOARD 7.5X6 YLW CONV (MISCELLANEOUS) ×6 IMPLANT
REAMER ROD DEEP FLUTE 2.5X950 (INSTRUMENTS) IMPLANT
SCREW 6.5MMX25MM (Screw) ×2 IMPLANT
SCREW 6.5MMX30MM (Screw) ×2 IMPLANT
SET HNDPC FAN SPRY TIP SCT (DISPOSABLE) IMPLANT
SPONGE LAP 4X18 RFD (DISPOSABLE) ×2 IMPLANT
SROM M HEAD 36MM PLUS 1.5 (Hips) ×3 IMPLANT
STAPLER VISISTAT 35W (STAPLE) ×5 IMPLANT
STEM SLVE SOLUTION STRT 8IN (Hips) ×2 IMPLANT
SUCTION FRAZIER HANDLE 10FR (MISCELLANEOUS) ×2
SUCTION TUBE FRAZIER 10FR DISP (MISCELLANEOUS) ×1 IMPLANT
SUT ETHIBOND NAB CT1 #1 30IN (SUTURE) ×9 IMPLANT
SUT TICRON (SUTURE) ×6 IMPLANT
SUT VIC AB 1 CTX 27 (SUTURE) ×2 IMPLANT
SUT VIC AB 2-0 CT1 27 (SUTURE) ×9
SUT VIC AB 2-0 CT1 TAPERPNT 27 (SUTURE) ×3 IMPLANT
SUT VICRYL 0 TIES 12 18 (SUTURE) ×3 IMPLANT
SYR CONTROL 10ML LL (SYRINGE) ×3 IMPLANT
TOWEL OR 17X24 6PK STRL BLUE (TOWEL DISPOSABLE) ×3 IMPLANT
TOWEL OR 17X26 10 PK STRL BLUE (TOWEL DISPOSABLE) ×3 IMPLANT
TOWER CARTRIDGE SMART MIX (DISPOSABLE) IMPLANT
TRAY FOLEY MTR SLVR 16FR STAT (SET/KITS/TRAYS/PACK) ×2 IMPLANT
WATER STERILE IRR 1000ML POUR (IV SOLUTION) ×12 IMPLANT

## 2018-04-29 NOTE — Anesthesia Preprocedure Evaluation (Addendum)
Anesthesia Evaluation  Patient identified by MRN, date of birth, ID band Patient awake    Reviewed: Allergy & Precautions, NPO status , Patient's Chart, lab work & pertinent test results  History of Anesthesia Complications (+) PONV and history of anesthetic complications  Airway Mallampati: II  TM Distance: >3 FB Neck ROM: Full    Dental no notable dental hx.    Pulmonary neg pulmonary ROS,    Pulmonary exam normal        Cardiovascular hypertension, Normal cardiovascular exam     Neuro/Psych negative neurological ROS  negative psych ROS   GI/Hepatic Neg liver ROS, GERD  ,  Endo/Other  Hypothyroidism   Renal/GU negative Renal ROS  negative genitourinary   Musculoskeletal  (+) Arthritis , Osteoarthritis,    Abdominal   Peds  Hematology negative hematology ROS (+)   Anesthesia Other Findings   Reproductive/Obstetrics                             Anesthesia Physical Anesthesia Plan  ASA: II  Anesthesia Plan: General   Post-op Pain Management:    Induction: Intravenous  PONV Risk Score and Plan: 4 or greater and Ondansetron, Dexamethasone, Treatment may vary due to age or medical condition and Scopolamine patch - Pre-op  Airway Management Planned: Oral ETT  Additional Equipment: None  Intra-op Plan:   Post-operative Plan: Extubation in OR  Informed Consent: I have reviewed the patients History and Physical, chart, labs and discussed the procedure including the risks, benefits and alternatives for the proposed anesthesia with the patient or authorized representative who has indicated his/her understanding and acceptance.     Plan Discussed with:   Anesthesia Plan Comments:         Anesthesia Quick Evaluation

## 2018-04-29 NOTE — Interval H&P Note (Signed)
History and Physical Interval Note:  04/29/2018 12:19 PM  Emma Lewis  has presented today for surgery, with the diagnosis of painful Left total hip arthroplasty metalosis  The various methods of treatment have been discussed with the patient and family. After consideration of risks, benefits and other options for treatment, the patient has consented to  Procedure(s): left total hip arthroplasty revision posterior approach (Left) as a surgical intervention .  The patient's history has been reviewed, patient examined, no change in status, stable for surgery.  I have reviewed the patient's chart and labs.  Questions were answered to the patient's satisfaction.     [name]

## 2018-04-29 NOTE — Anesthesia Procedure Notes (Signed)
Procedure Name: Intubation Date/Time: 04/29/2018 12:43 PM Performed by: Oletta Lamas, CRNA Pre-anesthesia Checklist: Patient identified, Emergency Drugs available, Suction available and Patient being monitored Patient Re-evaluated:Patient Re-evaluated prior to induction Oxygen Delivery Method: Circle System Utilized Preoxygenation: Pre-oxygenation with 100% oxygen Induction Type: IV induction Ventilation: Mask ventilation without difficulty Laryngoscope Size: Miller and 2 Grade View: Grade II Tube type: Oral Tube size: 7.0 mm Number of attempts: 1 Airway Equipment and Method: Stylet Placement Confirmation: ETT inserted through vocal cords under direct vision,  positive ETCO2 and breath sounds checked- equal and bilateral Secured at: 22 cm Tube secured with: Tape Dental Injury: Teeth and Oropharynx as per pre-operative assessment

## 2018-04-29 NOTE — Transfer of Care (Signed)
Immediate Anesthesia Transfer of Care Note  Patient: Emma Lewis  Procedure(s) Performed: left total hip arthroplasty revision posterior approach (Left Hip)  Patient Location: PACU  Anesthesia Type:General  Level of Consciousness: awake, oriented, drowsy and patient cooperative  Airway & Oxygen Therapy: Patient Spontanous Breathing and Patient connected to nasal cannula oxygen  Post-op Assessment: Report given to RN and Post -op Vital signs reviewed and stable  Post vital signs: Reviewed and stable  Last Vitals:  Vitals Value Taken Time  BP 106/64 04/29/2018  4:18 PM  Temp    Pulse 81 04/29/2018  4:26 PM  Resp 13 04/29/2018  4:26 PM  SpO2 93 % 04/29/2018  4:26 PM  Vitals shown include unvalidated device data.  Last Pain:  Vitals:   04/29/18 1620  TempSrc:   PainSc: (P) Asleep         Complications: No apparent anesthesia complications

## 2018-04-29 NOTE — Op Note (Addendum)
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  Postop diagnosis: Same  Procedure: Left total hip arthroplasty revision of all components acetabular and femur.  Surgeon: Rodell Perna, MD  Assistant: Benjiman Core, PA-C medically necessary and present for the entire procedure.  Anesthesia: General, oral tracheal.  EBL: 735 cc  Complications: None  Explant: Biomet 56 mm shell, acetabular liner screws x2 removal.  Removal of poly-.  Removal of  [facility] ProFemur stem with extended trochanteric osteotomy.  Implants: #1  Gripton 58 mm acetabular Depuy shell with 2 dome screws 30 and 25 mm.  8 inch AML Depuy fully coated stem +1.5 neck, metal ball.  +4 mm poly-liner.  Zimmer femoral cables x4  Brief history 67 year old female had original total hip arthroplasty with [facility] medical prosthesis by Dr.Jenna at [facility] 2011.  Broken polyliner revised by Dr. Lyla Glassing November 2017 with findings of metal debris broken poly-cup and she was revised to a Biomet acetabular shell with 2 screws and new poly-.  She presented with increased hip pain and trochanteric injection was performed for trochanteric bursitis with metallosis debris draining from the injection site.  Trochanteric site was injected and 10 cc of metallosis was taken out of the trochanteric bursa negative for cultures and lab work showed no evidence of infection but elevated CRP at 7.4, sed rate 51 and cultures negative with aspiration showing green-black fluid without organisms.  Patient's femoral stem was [employer] which was recalled and been taken off the market and associated with metallosis.  X-rays showed calcar resorption loosening around the acetabular shell.  Procedure : induction of general anesthesia with preoperative antibiotics lateral position Adelisa Satterwhite 7 frame axillary roll 1015 drapes DuraPrep to the ankle usual total hip sheets drapes sterile skin marker Betadine  Steri-Drape x2 ceiling the skin Ancef prophylaxis, timeout procedure.  Posterior approach was made extended 1 cm proximal and 3 cm distally initially.  There was severe metallosis drainage which was suctioned approximately 50 to 70 cc of black metallosis fluid.  No purulence was found no necrosis of the tissue.  There was calcar reabsorption with black metal debris.  Revision osteotomes were used around the proximal aspect flexible osteotomes but the stem would not come out with the extractor.  Vastus lateralis was elevated and extended trochanteric osteotomy was performed with an oscillating saw.  Due to bone resorption the the posterior portion cracked and the greater trochanter remained one-piece with posterior proximal 3 cm as a separate piece.  Osteotomes were used to break the bond running along the edge of the prosthesis and finally the stent was removed with an extractor.  Attention was turned to the acetabulum which had osteo lysis laterally and superiorly around the cup.  There was damage to the poly-cup surface from metallosis with scratching of the poly-.  Poly-was removed with an osteotome and curved osteotomes were used to break the areas of remaining ingrowth  between the acetabular cup and pelvis.  Acetabulum  was reamed and some the reaming was placed in the area of deficiency from the osteo lysis superior and lateral.  Cup was removed was a 56 mm cup and we reamed with a 56 followed by 57 mm reamer and inserted a 58 cup with some rim fit anterior and posterior and 2 acetabular dome screws were placed checked was sterilely draped C arm with good position.  Cup flexion and cup abduction was carefully positioned pointing to handle at the corner of the room 30  degrees of abduction.  +4 neutral liner was impacted cup was secure.  Attention was then turned to the femur and multiple cables were passed initially 3 followed by a fourth after placing a long broach down the canal and tightening the cables  slightly holding him with cable clamps.  This allowed stability and allowed [place] to progressively ream up to a 14 mm straight reamer followed by broach and then placement of a trial which was relatively tight it was removed and 8 inch AML fully porous-coated stem was impacted using fluoroscopic visualization at the tip of the trochanter since the calcar and resorbed and a portion of the lesser trochanter which was still present to gauge appropriate position of the stem for the appropriate level of impaction.  Stem was tight and fourth cable was then passed all cables were tightened down which gave good stability.  Area of the posterior cortex had broken off was then supplemented with 20 cc allograft chips then placed distally at the distal aspect of the l extended  trochanteric osteotomy.  +1 0.5 mm metal ball was then impacted hip was reduced it was able be flexed to 90 and internally rotated to 70 degrees with good stability.  Fluoroscopic visualization again AP and lateral the femur showed the stem was down the canal with good fill and fit.  Neck length looked appropriate there was only trace shock.  Bone graft was carefully positioned using all 20 cc and vastus lateralis that had been released with a small cuff of tissue was then repaired with #1 Vicryl.  #1 Vicryl was used in the fascia.  Areas of metallosis was resected sharply with a scalpel and Bovie electrocautery but there were still areas of metallosis still remaining.  Sciatic nerve was not tight and was palpated.  Tensor fascia was closed with interrupted sutures 2-0 Vicryl subtenons tissue skin staple closure postop dressing and knee immobilizer.  Patient will be touchdown weightbearing only x6 weeks.

## 2018-04-30 ENCOUNTER — Encounter (HOSPITAL_COMMUNITY): Payer: Self-pay | Admitting: Orthopaedic Surgery

## 2018-04-30 LAB — CBC
HEMATOCRIT: 31.5 % — AB (ref 36.0–46.0)
Hemoglobin: 10 g/dL — ABNORMAL LOW (ref 12.0–15.0)
MCH: 30.2 pg (ref 26.0–34.0)
MCHC: 31.7 g/dL (ref 30.0–36.0)
MCV: 95.2 fL (ref 78.0–100.0)
Platelets: 207 10*3/uL (ref 150–400)
RBC: 3.31 MIL/uL — ABNORMAL LOW (ref 3.87–5.11)
RDW: 14.5 % (ref 11.5–15.5)
WBC: 10.3 10*3/uL (ref 4.0–10.5)

## 2018-04-30 LAB — BASIC METABOLIC PANEL
ANION GAP: 5 (ref 5–15)
BUN: 9 mg/dL (ref 8–23)
CALCIUM: 8.8 mg/dL — AB (ref 8.9–10.3)
CO2: 28 mmol/L (ref 22–32)
CREATININE: 0.64 mg/dL (ref 0.44–1.00)
Chloride: 107 mmol/L (ref 98–111)
GFR calc Af Amer: >60 mL/min (ref >60)
GFR calc non Af Amer: >60 mL/min (ref >60)
GLUCOSE: 112 mg/dL — AB (ref 70–99)
Potassium: 4.3 mmol/L (ref 3.5–5.1)
Sodium: 140 mmol/L (ref 135–145)

## 2018-04-30 LAB — HEMOGLOBIN A1C
Hgb A1c MFr Bld: 5.8 % — ABNORMAL HIGH (ref 4.8–5.6)
Mean Plasma Glucose: 120 mg/dL

## 2018-04-30 NOTE — Progress Notes (Signed)
Subjective: Patient seen this morning.  Doing well.  Pain controlled.  States that since her surgery she has had some left dorsal foot soreness.   Objective: Vital signs in last 24 hours: Temp:  [97.3 F (36.3 C)-98.2 F (36.8 C)] 97.8 F (36.6 C) (10/03 0532) Pulse Rate:  [64-94] 90 (10/03 0532) Resp:  [10-18] 16 (10/03 0532) BP: (95-132)/(52-73) 113/71 (10/03 0532) SpO2:  [95 %-98 %] 95 % (10/03 0532)  Intake/Output from previous day: 10/02 0701 - 10/03 0700 In: 3041.5 [P.O.:480; I.V.:2261.5; IV Piggyback:300] Out: 1405 [Urine:955; Blood:450] Intake/Output this shift: Total I/O In: 360 [P.O.:360] Out: -   Recent Labs    04/30/18 0553  HGB 10.0*   Recent Labs    04/30/18 0553  WBC 10.3  RBC 3.31*  HCT 31.5*  PLT 207   Recent Labs    04/29/18 1025 04/30/18 0553  NA 139 140  K 4.0 4.3  CL 106 107  CO2 22 28  BUN 15 9  CREATININE 0.72 0.64  GLUCOSE 87 112*  CALCIUM 10.0 8.8*   No results for input(s): LABPT, INR in the last 72 hours.  Exam Very pleasant white female alert and oriented in no acute distress.  Left hip dressing clean dry and intact.  Bilateral calves nontender.  Left ankle she does have good range of motion.  She is somewhat tender around the dorsal talonavicular joint.  No swelling.  Neurovascular intact.  No focal motor deficits.  Knee immobilizer on.      Assessment/Plan: Patient will start PT.  Touchdown weightbearing left lower extremity only.  We discussed the importance of being compliant with weightbearing restrictions along with following hip precautions.  Recommend knee immobilizer being on at all times.  DC Dilaudid.   Benjiman Core 04/30/2018, 10:09 AM

## 2018-04-30 NOTE — Progress Notes (Signed)
Rehab Admissions Coordinator Note:  Patient was screened by Cleatrice Burke for appropriateness for an Inpatient Acute Rehab Consult per PT recommendation.  Patient may not meet the medical neccesity for an inpt rehab admit with this diagnosis. Would need to clarify if this is an orthopedic bundle. Please clarify with Dr. Lorin Mercy to assist with planning most appropriate rehab venue.  Danne Baxter, RN, MSN Rehab Admissions Coordinator [phone] 04/30/2018 3:17 PM

## 2018-04-30 NOTE — Anesthesia Postprocedure Evaluation (Signed)
Anesthesia Post Note  Patient: Emma Lewis  Procedure(s) Performed: left total hip arthroplasty revision posterior approach (Left Hip)     Anesthesia Post Evaluation  Last Vitals:  Vitals:   04/30/18 0532 04/30/18 1157  BP: 113/71 (!) 102/55  Pulse: 90 84  Resp: 16 16  Temp: 36.6 C 36.9 C  SpO2: 95% 95%    Last Pain:  Vitals:   04/30/18 1228  TempSrc:   PainSc: 2                  Lidia Collum

## 2018-04-30 NOTE — Evaluation (Signed)
Occupational Therapy Evaluation Patient Details Name: Emma Lewis MRN: [id number] DOB: Feb 13, 1951 Today's Date: 04/30/2018    History of Present Illness Pt is a 67 y/o female who presents s/p L total hip replacement revision. Pt has posterior hip precautions and is TDWB status on the LLE.    Clinical Impression   Pt admitted with above diagnoses. Occasionally tangential in recalling situations. Pt up in chair upon arrival, motivated to work with OT. Reviewed posterior hip precautions, pt with inability to recall all precautions this date. Continued education and reminded of visual cue of precautions in room. Functional transfers not fully tested this date 2/2 meal arrival and pt preference to remain in chair. Educated and introduced idea of LB ADL equipment to help increase functional engagement in LB ADL, pt agreeable to trial next session. Pt will benefit from LB ADL training and functional transfer training to increase BADL engagement. Pt motivated to return to PLOF, eager to participate with therapies. CIR recommendation appropriate for pt this date to most effectively address stated deficits in BADL. Will continue to follow pt acutely.    Follow Up Recommendations  CIR    Equipment Recommendations  Other (comment)(LB ADL equipment)    Recommendations for Other Services       Precautions / Restrictions Precautions Precautions: Fall;Posterior Hip Precaution Booklet Issued: Yes (comment) Precaution Comments: reviewed with pt precaution sheet hung up in room Restrictions Weight Bearing Restrictions: Yes LLE Weight Bearing: Touchdown weight bearing      Mobility Bed Mobility                  Transfers                 General transfer comment: Pt up in chair upon arrival, transferred not tested this date due to meal arrival    Balance       Sitting balance - Comments: not formally tested this date, up in recliner with significant support                                    ADL either performed or assessed with clinical judgement   ADL Overall ADL's : Needs assistance/impaired Eating/Feeding: Set up;Sitting   Grooming: Set up;Sitting   Upper Body Bathing: Set up;Sitting   Lower Body Bathing: Total assistance;Sit to/from stand;Sitting/lateral leans;Adhering to hip precautions;Cueing for compensatory techniques;With adaptive equipment   Upper Body Dressing : Set up;Sitting   Lower Body Dressing: Total assistance;Sit to/from stand;Adhering to hip precautions;Sitting/lateral leans;Cueing for compensatory techniques;With adaptive equipment   Toilet Transfer: Total assistance;BSC;Adhering to hip precautions;RW   Toileting- Clothing Manipulation and Hygiene: Moderate assistance;Sit to/from stand;Adhering to hip precautions   Tub/ Shower Transfer: Total assistance;Adhering to hip precautions;Shower seat;Rolling walker   Functional mobility during ADLs: Maximal assistance;Total assistance;Rolling walker General ADL Comments: Pt currently limited by pain to safely complete functional mobility during ADLs. Pt unable to recall hip precautions despite visual cue and reminders.      Vision Baseline Vision/History: No visual deficits Patient Visual Report: No change from baseline       Perception     Praxis      Pertinent Vitals/Pain Pain Assessment: 0-10 Pain Score: 7  Pain Location: L hip Pain Descriptors / Indicators: Operative site guarding;Discomfort Pain Intervention(s): Limited activity within patient's tolerance;Monitored during session     Hand Dominance Right   Extremity/Trunk Assessment Upper Extremity Assessment Upper Extremity Assessment: Generalized  weakness;RUE deficits/detail RUE Deficits / Details: reports 3 shoulder surgeries on R side   Lower Extremity Assessment Lower Extremity Assessment: Defer to PT evaluation       Communication Communication Communication: No difficulties   Cognition  Arousal/Alertness: Awake/alert Behavior During Therapy: WFL for tasks assessed/performed Overall Cognitive Status: Within Functional Limits for tasks assessed                                     General Comments       Exercises     Shoulder Instructions      Home Living Family/patient expects to be discharged to:: Private residence Living Arrangements: Spouse/significant other Available Help at Discharge: Family;Available 24 hours/day Type of Home: House Home Access: Stairs to enter [employer] of Steps: 3 in front, 8 in back Entrance Stairs-Rails: Left Home Layout: One level     Bathroom Shower/Tub: Occupational psychologist: Standard Bathroom Accessibility: Yes   Home Equipment: Environmental consultant - 2 wheels;Cane - single point;Bedside commode;Crutches;Shower seat - built in          Prior Functioning/Environment Level of Independence: Independent with assistive device(s)        Comments: using RW, husband assisting with some LB dressing (socks)        OT Problem List: Decreased strength;Impaired balance (sitting and/or standing);Decreased knowledge of precautions;Pain;Decreased activity tolerance;Decreased knowledge of use of DME or AE      OT Treatment/Interventions: Self-care/ADL training;DME and/or AE instruction;Patient/family education;Balance training    OT Goals(Current goals can be found in the care plan section) Acute Rehab OT Goals Patient Stated Goal: return home, get stronger OT Goal Formulation: With patient/family Time For Goal Achievement: 05/14/18 Potential to Achieve Goals: Good  OT Frequency: Min 3X/week   Barriers to D/C:            Co-evaluation              AM-PAC PT "6 Clicks" Daily Activity     Outcome Measure Help from another person eating meals?: None Help from another person taking care of personal grooming?: A Little Help from another person toileting, which includes using toliet, bedpan, or  urinal?: Total(to go to [facility]) Help from another person bathing (including washing, rinsing, drying)?: Total(for LB) Help from another person to put on and taking off regular upper body clothing?: None Help from another person to put on and taking off regular lower body clothing?: Total 6 Click Score: 14   End of Session Equipment Utilized During Treatment: Left knee immobilizer Nurse Communication: Patient requests pain meds  Activity Tolerance: Patient tolerated treatment well Patient left: in chair;with call bell/phone within reach;with family/visitor present  OT Visit Diagnosis: Other abnormalities of gait and mobility (R26.89);Muscle weakness (generalized) (M62.81);Pain Pain - Right/Left: Left Pain - part of body: Hip;Leg                Time: 2297-9892 OT Time Calculation (min): 31 min Charges:  OT General Charges $OT Visit: 1 Visit OT Evaluation $OT Eval Moderate Complexity: 1 Mod  Zenovia Jarred, MSOT, OTR/L Behavioral Health OT/ Acute Relief OT  Zenovia Jarred 04/30/2018, 5:29 PM

## 2018-04-30 NOTE — Progress Notes (Signed)
Physical Therapy Evaluation  Assessment: Pt admitted with above diagnosis. Pt currently with functional limitations due to the deficits listed below (see PT Problem List). At the time of PT eval pt was significantly limited by pain and weakness. Pt is motivated to work with therapy and return home, however at this time she is requiring +2 assist for completing basic transfers safely. Pt wants to avoid SNF if at all possible, however feel she would be at increased risk for falls and further injury as she is unable to maintain TDWB at this time. Recommending CIR consult to maximize functional independence, decrease caregiver burden, and return to PLOF. Acutely, pt will benefit from skilled PT to increase their independence and safety with mobility to allow discharge to the venue listed below.       04/30/18 1000  PT Visit Information  Last PT Received On 04/30/18  Assistance Needed +1  History of Present Illness Pt is a 67 y/o female who presents s/p L total hip replacement revision. Pt has posterior hip precautions and is TDWB status on the LLE.   Precautions  Precautions Fall;Posterior Hip  Precaution Booklet Issued Yes (comment)  Precaution Comments Reviewed with pt and hung precaution sheet up in room.  Restrictions  Weight Bearing Restrictions Yes  LLE Weight Bearing TWB  Home Living  Family/patient expects to be discharged to: Private residence  Living Arrangements Spouse/significant other  Available Help at Discharge Family;Available 24 hours/day  Type of Home House  Home Access Stairs to enter  Entrance Stairs-Number of Steps 3 in front, 8 in the back  Entrance Stairs-Rails Left  Home Layout One level  Bathroom Shower/Tub Walk-in Cytogeneticist Yes  Home Equipment Valentine - 2 wheels;Cane - single point;BSC;Crutches  Prior Function  Level of Independence Independent with assistive device(s)  Comments RW outside but used husband for assist  when out in the community`  Communication  Communication No difficulties  Pain Assessment  Pain Assessment 0-10  Pain Score 5  Pain Location L hip  Pain Descriptors / Indicators Operative site guarding;Discomfort  Pain Intervention(s) Limited activity within patient's tolerance;Monitored during session;Repositioned  Cognition  Arousal/Alertness Awake/alert  Behavior During Therapy WFL for tasks assessed/performed  Overall Cognitive Status Within Functional Limits for tasks assessed  Upper Extremity Assessment  Upper Extremity Assessment Generalized weakness  Lower Extremity Assessment  Lower Extremity Assessment LLE deficits/detail  LLE Deficits / Details Decreased strength and AROM consistent with above mentioned surgery.   LLE Unable to fully assess due to pain;Unable to fully assess due to immobilization  Cervical / Trunk Assessment  Cervical / Trunk Assessment Normal  Bed Mobility  Overal bed mobility Needs Assistance  Bed Mobility Supine to Sit  Supine to sit Mod assist;HOB elevated  General bed mobility comments VC's for sequencing and use of bed rails for support. Pt required mod assist for LLE advancement towards EOB as well as trunk stability with transition to EOB.   Transfers  Overall transfer level Needs assistance  Equipment used Rolling walker (2 wheeled)  Transfers Sit to/from [facility]  Sit to Stand Mod assist  Stand pivot transfers Max assist  General transfer comment Increased time and assistance to power-up to full stand. Pt had difficulty advancing LE's and maintaining TDWB status on the LLE. Pt was able to scoot R foot around minimally and then [facility] was pulled up behind her. After standing from the [facility], pt unable to advance feet at all and [facility] was  pulled out and recliner was pulled up behind her. +2 required for safety and management of equipment. If attempting to ambulate would definitely need +2 for physical assistance.   Ambulation/Gait   General Gait Details Unable at this time.   Balance  Overall balance assessment Needs assistance  Sitting-balance support Feet supported;Bilateral upper extremity supported  Sitting balance-Leahy Scale Poor  Sitting balance - Comments Likely due to pain, pt leaning to the R and having difficulty sitting unsupported without hands on the bed.  Standing balance support Bilateral upper extremity supported;During functional activity  Standing balance-Leahy Scale Zero  Standing balance comment Max assist required.   PT - End of Session  Equipment Utilized During Treatment Gait belt;Left knee immobilizer  Activity Tolerance Patient limited by pain (and weakness)  Patient left in chair;with call bell/phone within reach  Nurse Communication Mobility status;Need for lift equipment (Lift pad left under pt in chair for back to bed)  PT Assessment  PT Recommendation/Assessment Patient needs continued PT services  PT Visit Diagnosis Unsteadiness on feet (R26.81);Pain;Difficulty in walking, not elsewhere classified (R26.2)  Pain - Right/Left Left  Pain - part of body Hip  PT Problem List Decreased strength;Decreased range of motion;Decreased activity tolerance;Decreased balance;Decreased mobility;Decreased knowledge of use of DME;Decreased safety awareness;Decreased knowledge of precautions;Pain  Barriers to Discharge Decreased caregiver support  Barriers to Discharge Comments Supportive husband at home however pt is requiring a significant amount of assistance at this time for basic transfers.   PT Plan  PT Frequency (ACUTE ONLY) Min 6X/week  PT Treatment/Interventions (ACUTE ONLY) DME instruction;Gait training;Stair training;Functional mobility training;Therapeutic activities;Therapeutic exercise;Neuromuscular re-education;Patient/family education  AM-PAC PT "6 Clicks" Daily Activity Outcome Measure  Difficulty turning over in bed (including adjusting bedclothes, sheets and blankets)? 1  Difficulty  moving from lying on back to sitting on the side of the bed?  1  Difficulty sitting down on and standing up from a chair with arms (e.g., wheelchair, bedside commode, etc,.)? 1  Help needed moving to and from a bed to chair (including a wheelchair)? 2  Help needed walking in hospital room? 1  Help needed climbing 3-5 steps with a railing?  1  6 Click Score 7  Mobility G Code  CM  PT Recommendation  Recommendations for Other Services Rehab consult  Follow Up Recommendations CIR;Supervision/Assistance - 24 hour  PT equipment None recommended by PT  Individuals Consulted  Consulted and Agree with Results and Recommendations Patient  Acute Rehab PT Goals  Patient Stated Goal Return home - wants to avoid SNF if at all possible  PT Goal Formulation With patient  Time For Goal Achievement 05/14/18  Potential to Achieve Goals Good  PT Time Calculation  PT Start Time (ACUTE ONLY) 1041  PT Stop Time (ACUTE ONLY) 1129  PT Time Calculation (min) (ACUTE ONLY) 48 min  PT General Charges  $$ ACUTE PT VISIT 1 Visit  PT Evaluation  $PT Eval Moderate Complexity 1 Mod  PT Treatments  $Gait Training 23-37 mins  Written Expression  Dominant Hand Right   Rolinda Roan, PT, DPT Acute Rehabilitation Services Pager: [phone] Office: [phone]

## 2018-05-01 ENCOUNTER — Encounter (HOSPITAL_COMMUNITY): Payer: Self-pay | Admitting: *Deleted

## 2018-05-01 ENCOUNTER — Telehealth (INDEPENDENT_AMBULATORY_CARE_PROVIDER_SITE_OTHER): Payer: Self-pay

## 2018-05-01 ENCOUNTER — Inpatient Hospital Stay (HOSPITAL_COMMUNITY)
Admission: RE | Admit: 2018-05-01 | Discharge: 2018-05-15 | DRG: 560 | Disposition: A | Discharge status: Home Health | Payer: Medicare Other | Source: Intra-hospital | Attending: Physical Medicine & Rehabilitation | Admitting: Physical Medicine & Rehabilitation

## 2018-05-01 ENCOUNTER — Telehealth (INDEPENDENT_AMBULATORY_CARE_PROVIDER_SITE_OTHER): Payer: Self-pay | Admitting: Orthopaedic Surgery

## 2018-05-01 ENCOUNTER — Inpatient Hospital Stay (HOSPITAL_COMMUNITY): Payer: Medicare Other

## 2018-05-01 DIAGNOSIS — Z888 Allergy status to other drugs, medicaments and biological substances status: Secondary | ICD-10-CM

## 2018-05-01 DIAGNOSIS — K219 Gastro-esophageal reflux disease without esophagitis: Secondary | ICD-10-CM | POA: Diagnosis present

## 2018-05-01 DIAGNOSIS — M170 Bilateral primary osteoarthritis of knee: Secondary | ICD-10-CM | POA: Diagnosis present

## 2018-05-01 DIAGNOSIS — Z96643 Presence of artificial hip joint, bilateral: Secondary | ICD-10-CM | POA: Diagnosis present

## 2018-05-01 DIAGNOSIS — E039 Hypothyroidism, unspecified: Secondary | ICD-10-CM | POA: Diagnosis present

## 2018-05-01 DIAGNOSIS — K5900 Constipation, unspecified: Secondary | ICD-10-CM | POA: Diagnosis present

## 2018-05-01 DIAGNOSIS — Z9841 Cataract extraction status, right eye: Secondary | ICD-10-CM

## 2018-05-01 DIAGNOSIS — T84021A Dislocation of internal left hip prosthesis, initial encounter: Secondary | ICD-10-CM | POA: Diagnosis present

## 2018-05-01 DIAGNOSIS — D72823 Leukemoid reaction: Secondary | ICD-10-CM

## 2018-05-01 DIAGNOSIS — E8809 Other disorders of plasma-protein metabolism, not elsewhere classified: Secondary | ICD-10-CM | POA: Diagnosis present

## 2018-05-01 DIAGNOSIS — Z471 Aftercare following joint replacement surgery: Principal | ICD-10-CM

## 2018-05-01 DIAGNOSIS — E46 Unspecified protein-calorie malnutrition: Secondary | ICD-10-CM | POA: Diagnosis not present

## 2018-05-01 DIAGNOSIS — Z9842 Cataract extraction status, left eye: Secondary | ICD-10-CM | POA: Diagnosis not present

## 2018-05-01 DIAGNOSIS — Z90721 Acquired absence of ovaries, unilateral: Secondary | ICD-10-CM | POA: Diagnosis not present

## 2018-05-01 DIAGNOSIS — D62 Acute posthemorrhagic anemia: Secondary | ICD-10-CM | POA: Diagnosis not present

## 2018-05-01 DIAGNOSIS — T84011S Broken internal left hip prosthesis, sequela: Secondary | ICD-10-CM | POA: Diagnosis not present

## 2018-05-01 DIAGNOSIS — D72829 Elevated white blood cell count, unspecified: Secondary | ICD-10-CM | POA: Diagnosis not present

## 2018-05-01 DIAGNOSIS — G8918 Other acute postprocedural pain: Secondary | ICD-10-CM | POA: Diagnosis not present

## 2018-05-01 DIAGNOSIS — R29898 Other symptoms and signs involving the musculoskeletal system: Secondary | ICD-10-CM

## 2018-05-01 DIAGNOSIS — M62838 Other muscle spasm: Secondary | ICD-10-CM | POA: Diagnosis not present

## 2018-05-01 DIAGNOSIS — Z79890 Hormone replacement therapy (postmenopausal): Secondary | ICD-10-CM | POA: Diagnosis not present

## 2018-05-01 DIAGNOSIS — Z882 Allergy status to sulfonamides status: Secondary | ICD-10-CM

## 2018-05-01 DIAGNOSIS — M1712 Unilateral primary osteoarthritis, left knee: Secondary | ICD-10-CM | POA: Diagnosis not present

## 2018-05-01 DIAGNOSIS — Z961 Presence of intraocular lens: Secondary | ICD-10-CM | POA: Diagnosis present

## 2018-05-01 DIAGNOSIS — Z9079 Acquired absence of other genital organ(s): Secondary | ICD-10-CM

## 2018-05-01 DIAGNOSIS — K760 Fatty (change of) liver, not elsewhere classified: Secondary | ICD-10-CM | POA: Diagnosis present

## 2018-05-01 DIAGNOSIS — E785 Hyperlipidemia, unspecified: Secondary | ICD-10-CM | POA: Diagnosis present

## 2018-05-01 DIAGNOSIS — R740 Nonspecific elevation of levels of transaminase and lactic acid dehydrogenase [LDH]: Secondary | ICD-10-CM | POA: Diagnosis not present

## 2018-05-01 DIAGNOSIS — J302 Other seasonal allergic rhinitis: Secondary | ICD-10-CM | POA: Diagnosis present

## 2018-05-01 DIAGNOSIS — Z981 Arthrodesis status: Secondary | ICD-10-CM

## 2018-05-01 DIAGNOSIS — M7989 Other specified soft tissue disorders: Secondary | ICD-10-CM

## 2018-05-01 DIAGNOSIS — M1711 Unilateral primary osteoarthritis, right knee: Secondary | ICD-10-CM | POA: Diagnosis not present

## 2018-05-01 DIAGNOSIS — R7401 Elevation of levels of liver transaminase levels: Secondary | ICD-10-CM

## 2018-05-01 DIAGNOSIS — M79609 Pain in unspecified limb: Secondary | ICD-10-CM

## 2018-05-01 DIAGNOSIS — R7303 Prediabetes: Secondary | ICD-10-CM | POA: Diagnosis present

## 2018-05-01 DIAGNOSIS — Z79899 Other long term (current) drug therapy: Secondary | ICD-10-CM

## 2018-05-01 LAB — CBC
HCT: 31 % — ABNORMAL LOW (ref 36.0–46.0)
HEMOGLOBIN: 9.9 g/dL — AB (ref 12.0–15.0)
MCH: 30.7 pg (ref 26.0–34.0)
MCHC: 31.9 g/dL (ref 30.0–36.0)
MCV: 96 fL (ref 78.0–100.0)
Platelets: 206 10*3/uL (ref 150–400)
RBC: 3.23 MIL/uL — ABNORMAL LOW (ref 3.87–5.11)
RDW: 14.7 % (ref 11.5–15.5)
WBC: 13.3 10*3/uL — ABNORMAL HIGH (ref 4.0–10.5)

## 2018-05-01 MED ORDER — ACETAMINOPHEN 325 MG PO TABS
325.0000 mg | ORAL_TABLET | Freq: Four times a day (QID) | ORAL | Status: DC | PRN
  Administered 2018-05-02 – 2018-05-12 (×3): 650 mg via ORAL
  Administered 2018-05-13: 325 mg via ORAL
  Administered 2018-05-14 – 2018-05-15 (×3): 650 mg via ORAL
  Filled 2018-05-01 (×4): qty 2
  Filled 2018-05-01: qty 1
  Filled 2018-05-01 (×3): qty 2

## 2018-05-01 MED ORDER — LEVOTHYROXINE SODIUM 100 MCG PO TABS
100.0000 ug | ORAL_TABLET | Freq: Every day | ORAL | Status: DC
  Administered 2018-05-02 – 2018-05-15 (×14): 100 ug via ORAL
  Filled 2018-05-01 (×14): qty 1

## 2018-05-01 MED ORDER — DOCUSATE SODIUM 100 MG PO CAPS
100.0000 mg | ORAL_CAPSULE | Freq: Two times a day (BID) | ORAL | Status: DC
  Administered 2018-05-01 – 2018-05-03 (×4): 100 mg via ORAL
  Filled 2018-05-01 (×4): qty 1

## 2018-05-01 MED ORDER — PANTOPRAZOLE SODIUM 40 MG PO TBEC
40.0000 mg | DELAYED_RELEASE_TABLET | Freq: Every day | ORAL | Status: DC
  Administered 2018-05-02 – 2018-05-15 (×14): 40 mg via ORAL
  Filled 2018-05-01 (×14): qty 1

## 2018-05-01 MED ORDER — VITAMIN D 1000 UNITS PO TABS
2000.0000 [IU] | ORAL_TABLET | Freq: Two times a day (BID) | ORAL | Status: DC
  Administered 2018-05-01 – 2018-05-15 (×28): 2000 [IU] via ORAL
  Filled 2018-05-01 (×29): qty 2

## 2018-05-01 MED ORDER — ASPIRIN EC 325 MG PO TBEC
325.0000 mg | DELAYED_RELEASE_TABLET | Freq: Every day | ORAL | Status: DC
  Administered 2018-05-02 – 2018-05-15 (×14): 325 mg via ORAL
  Filled 2018-05-01 (×14): qty 1

## 2018-05-01 MED ORDER — OXYCODONE-ACETAMINOPHEN 5-325 MG PO TABS: 1.0000 | ORAL_TABLET | Freq: Four times a day (QID) | ORAL | 0 refills | Status: DC | PRN

## 2018-05-01 MED ORDER — SIMVASTATIN 20 MG PO TABS
40.0000 mg | ORAL_TABLET | Freq: Every day | ORAL | Status: DC
  Administered 2018-05-01 – 2018-05-14 (×14): 40 mg via ORAL
  Filled 2018-05-01 (×14): qty 2

## 2018-05-01 MED ORDER — MECLIZINE HCL 25 MG PO TABS
25.0000 mg | ORAL_TABLET | Freq: Every morning | ORAL | Status: DC
  Administered 2018-05-02 – 2018-05-15 (×14): 25 mg via ORAL
  Filled 2018-05-01 (×14): qty 1

## 2018-05-01 MED ORDER — OXYCODONE HCL 5 MG PO TABS
5.0000 mg | ORAL_TABLET | Freq: Four times a day (QID) | ORAL | Status: DC | PRN
  Administered 2018-05-02 – 2018-05-08 (×19): 10 mg via ORAL
  Administered 2018-05-09: 5 mg via ORAL
  Administered 2018-05-09 – 2018-05-10 (×2): 10 mg via ORAL
  Administered 2018-05-10: 5 mg via ORAL
  Administered 2018-05-11 – 2018-05-15 (×13): 10 mg via ORAL
  Filled 2018-05-01 (×6): qty 2
  Filled 2018-05-01: qty 1
  Filled 2018-05-01 (×13): qty 2
  Filled 2018-05-01: qty 1
  Filled 2018-05-01 (×2): qty 2
  Filled 2018-05-01: qty 1
  Filled 2018-05-01 (×7): qty 2
  Filled 2018-05-01: qty 1
  Filled 2018-05-01 (×7): qty 2

## 2018-05-01 MED ORDER — ONDANSETRON HCL 4 MG PO TABS: 4.0000 mg | ORAL_TABLET | Freq: Four times a day (QID) | ORAL | Status: DC | PRN

## 2018-05-01 MED ORDER — ONDANSETRON HCL 4 MG/2ML IJ SOLN: 4.0000 mg | Freq: Four times a day (QID) | INTRAMUSCULAR | Status: DC | PRN

## 2018-05-01 MED ORDER — POLYVINYL ALCOHOL 1.4 % OP SOLN: 1.0000 [drp] | OPHTHALMIC | Status: DC | PRN

## 2018-05-01 MED ORDER — ASPIRIN 325 MG PO TBEC: 325.0000 mg | DELAYED_RELEASE_TABLET | Freq: Every day | ORAL | 0 refills | Status: DC

## 2018-05-01 MED ORDER — POLYETHYLENE GLYCOL 3350 17 G PO PACK
17.0000 g | PACK | Freq: Every day | ORAL | Status: DC | PRN
  Administered 2018-05-02 – 2018-05-06 (×2): 17 g via ORAL
  Filled 2018-05-01 (×2): qty 1

## 2018-05-01 NOTE — Consult Note (Signed)
Physical Medicine and Rehabilitation Consult Reason for Consult: Decreased functional mobility Referring Physician: Dr. Lorin Mercy   HPI: Emma Lewis is a 67 y.o. right-handed female with history of right total hip arthroplasty 01/21/2018 as well as left total hip replacement 2011 with revision 2017.Per chart review lives with spouse and independent with assistive device.One level home wit 3 steps to enter. Presented 04/29/2018 with increasing left hip pain.  No change with conservative care.  X-rays show calcar resorption loosening around the acetabular shell.  Underwent left total hip arthroplasty revision of all components acetabular and femur 04/29/2018 per Dr. Inda Merlin.  Hospital course pain management.  Touchdown weightbearing left lower extremity.Venous duplex negative.  Acute blood loss anemia 9.9.  Therapy evaluation completed.  MD has requested physical medicine rehab consult.   Review of Systems  Constitutional: Negative for chills and fever.  HENT: Negative for hearing loss.   Eyes: Negative for blurred vision and double vision.  Respiratory: Negative for cough and shortness of breath.   Cardiovascular: Negative for chest pain, palpitations and leg swelling.  Gastrointestinal: Positive for constipation. Negative for nausea and vomiting.       GERD  Genitourinary: Negative for dysuria, flank pain and hematuria.  Musculoskeletal: Positive for back pain and myalgias.  Skin: Negative for rash.  Neurological: Positive for dizziness and headaches.  All other systems reviewed and are negative.  Past Medical History:  Diagnosis Date  . Arthritis    "all over my body" (04/29/2018)  . Chronic lower back pain   . Fatty liver   . Fever blister    Takes Acyclovir  . GERD (gastroesophageal reflux disease)   . Hyperlipemia   . Hypothyroidism   . Migraines   . Osteopenia   . Pneumonia    "once; years ago" (04/29/2018)  . PONV (postoperative nausea and vomiting)   . Pre-diabetes   .  Seasonal allergies   . Vertigo    Past Surgical History:  Procedure Laterality Date  . ANTERIOR CERVICAL DECOMP/DISCECTOMY FUSION N/A 02/09/2014   Procedure: ANTERIOR CERVICAL DECOMPRESSION/DISCECTOMY FUSION 2 LEVELS;  Surgeon: [name], MD;  Location: [place];  Service: Orthopedics;  Laterality: N/A;  C4-5, C5-6 Anterior Cervical Discectomy and Fusion, Allograft, Plate  . BACK SURGERY    . CARPAL TUNNEL RELEASE Right 2018  . CATARACT EXTRACTION W/ INTRAOCULAR LENS  IMPLANT, BILATERAL    . COLONOSCOPY    . FIXATION KYPHOPLASTY LUMBAR SPINE  X 2  . HIP ARTHROPLASTY Left 2011  . JOINT REPLACEMENT    . KNEE ARTHROSCOPY Right   . PATELLA FRACTURE SURGERY Right ~ 1990   "put metal in"  . PATELLA HARDWARE REMOVAL Right    "took the metal out"  . REVISION TOTAL HIP ARTHROPLASTY Left 04/29/2018  . ROBOTIC ASSISTED BILATERAL SALPINGO OOPHERECTOMY Bilateral 08/01/2015   Procedure: ROBOTIC ASSISTED BILATERAL SALPINGO OOPHORECTOMY;  Surgeon: Everitt Amber, MD;  Location: [facility];  Service: Gynecology;  Laterality: Bilateral;  . SHOULDER OPEN ROTATOR CUFF REPAIR Right    3 TOTAL  . SHOULDER SURGERY Right   . TOTAL HIP ARTHROPLASTY Right 01/21/2018   Procedure: RIGHT TOTAL HIP ARTHROPLASTY DIRECT ANTERIOR;  Surgeon: [name], MD;  Location: [place];  Service: Orthopedics;  Laterality: Right;  . TOTAL HIP REVISION Left 06/10/2016   Procedure: TOTAL HIP REVISION ARTHROPLASTY;  Surgeon: Rod Can, MD;  Location: [place];  Service: Orthopedics;  Laterality: Left;  . TOTAL HIP REVISION Left 04/29/2018   Procedure: left  total hip arthroplasty revision posterior approach;  Surgeon: [name], MD;  Location: [place];  Service: Orthopedics;  Laterality: Left;  . WRIST GANGLION EXCISION Left 1970s   Family History  Problem Relation Age of Onset  . Lymphoma Mother    Social History:  reports that she has never smoked. She has never used smokeless tobacco. She reports that she drinks alcohol. She reports  that she does not use drugs. Allergies:  Allergies  Allergen Reactions  . Sulfa Antibiotics Other (See Comments)    UNSPECIFIED REACTION > Childhood allergy Mother said "I liked to have died"  . Fenofibrate Rash    She didn't feel right   Medications Prior to Admission  Medication Sig Dispense Refill  . acyclovir (ZOVIRAX) 400 MG tablet Take 400 mg by mouth daily.    . Calcium Carb-Cholecalciferol (CALCIUM 600 + D PO) Take 1 tablet by mouth 3 (three) times daily.     . Carboxymethylcellulose Sodium (THERATEARS) 0.25 % SOLN Place 1 drop into both eyes 2 (two) times daily as needed (dry eyes).    . Cholecalciferol (VITAMIN D3) 2000 units TABS Take 2,000 Units by mouth 2 (two) times daily.     Marland Kitchen ibuprofen (ADVIL,MOTRIN) 200 MG tablet Take 800 mg by mouth every 6 (six) hours as needed (pain).    Marland Kitchen levothyroxine (SYNTHROID, LEVOTHROID) 100 MCG tablet Take 100 mcg by mouth daily before breakfast.     . meclizine (ANTIVERT) 25 MG tablet Take 1 tablet (25 mg total) by mouth 3 (three) times daily as needed for dizziness. Vertigo. Also available OTC (Patient taking differently: Take 25 mg by mouth every morning. Vertigo. Also available OT) 90 tablet 3  . Multiple Vitamins-Minerals (EMERGEN-C VITAMIN C) PACK Take 1 Package by mouth daily.    . mupirocin ointment (BACTROBAN) 2 % Use as directed. (Patient taking differently: Place 1 application into the nose 2 (two) times daily. Use as directed.) 22 g 0  . Omega-3 Fatty Acids (OMEGA-3 FISH OIL PO) Take 1 capsule by mouth 3 (three) times daily. 1,040mg  each     . omeprazole (PRILOSEC) 20 MG capsule Take 10 mg by mouth daily.     . phentermine 37.5 MG capsule Take 37.5 mg by mouth every morning.  0  . simvastatin (ZOCOR) 40 MG tablet Take 40 mg by mouth at bedtime.     Marland Kitchen Specialty Vitamins Products (BIOTIN PLUS KERATIN) 10000-100 MCG-MG TABS Take 1 tablet by mouth 3 (three) times daily.     . TURMERIC CURCUMIN PO Take 1 capsule by mouth 2 (two) times  daily. 500mg  each    . alendronate (FOSAMAX) 70 MG tablet Take 70 mg by mouth every Sunday.  1  . clobetasol cream (TEMOVATE) 2.37 % Apply 1 application topically 2 (two) times daily. (Patient taking differently: Apply 1 application topically daily as needed (irritation). ) 30 g 1  . nystatin-triamcinolone (MYCOLOG II) cream Apply 1 application topically 2 (two) times daily as needed (irritation).   3    Home: Home Living Family/patient expects to be discharged to:: Private residence Living Arrangements: Spouse/significant other Available Help at Discharge: Family, Available 24 hours/day Type of Home: House Home Access: Stairs to enter [employer] of Steps: 3 in front, 8 in back Entrance Stairs-Rails: Left [place]: One level Bathroom Shower/Tub: Multimedia programmer: Standard Bathroom Accessibility: Yes Home Equipment: Environmental consultant - 2 wheels, Cane - single point, Bedside commode, Crutches, Shower seat - built in  Functional History: Prior Function Level  of Independence: Independent with assistive device(s) Comments: using RW, husband assisting with some LB dressing (socks) Functional Status:  Mobility: Bed Mobility Overal bed mobility: Needs Assistance Bed Mobility: Supine to Sit Supine to sit: Mod assist, HOB elevated General bed mobility comments: VC's for sequencing and use of bed rails for support. Pt required mod assist for LLE advancement towards EOB as well as trunk stability with transition to EOB.  Transfers Overall transfer level: Needs assistance Equipment used: Rolling walker (2 wheeled) Transfers: Sit to/from Stand, [employer] Transfers Sit to Stand: Mod assist Stand pivot transfers: Max assist General transfer comment: Pt up in chair upon arrival, transferred not tested this date due to meal arrival Ambulation/Gait General Gait Details: Unable at this time.     ADL: ADL Overall ADL's : Needs assistance/impaired Eating/Feeding: Set up,  Sitting Grooming: Set up, Sitting Upper Body Bathing: Set up, Sitting Lower Body Bathing: Total assistance, Sit to/from stand, Sitting/lateral leans, Adhering to hip precautions, Cueing for compensatory techniques, With adaptive equipment Upper Body Dressing : Set up, Sitting Lower Body Dressing: Total assistance, Sit to/from stand, Adhering to hip precautions, Sitting/lateral leans, Cueing for compensatory techniques, With adaptive equipment Toilet Transfer: Total assistance, BSC, Adhering to hip precautions, RW Toileting- Clothing Manipulation and Hygiene: Moderate assistance, Sit to/from stand, Adhering to hip precautions Tub/ Shower Transfer: Total assistance, Adhering to hip precautions, Shower seat, Rolling walker Functional mobility during ADLs: Maximal assistance, Total assistance, Rolling walker General ADL Comments: Pt currently limited by pain to safely complete functional mobility during ADLs. Pt unable to recall hip precautions despite visual cue and reminders.   Cognition: Cognition Overall Cognitive Status: Within Functional Limits for tasks assessed Orientation Level: Oriented X4 Cognition Arousal/Alertness: Awake/alert Behavior During Therapy: WFL for tasks assessed/performed Overall Cognitive Status: Within Functional Limits for tasks assessed  Blood pressure 111/66, pulse 85, temperature 98 F (36.7 C), temperature source Oral, resp. rate 16, height 5\' 5"  (1.651 m), weight 86.3 kg, SpO2 97 %. Physical Exam  Vitals reviewed. Constitutional: She is oriented to person, place, and time. She appears well-developed.  HENT:  Head: Normocephalic.  Eyes: Pupils are equal, round, and reactive to light.  Neck: Normal range of motion.  Cardiovascular: Normal rate.  Respiratory: Effort normal.  GI: Soft.  Musculoskeletal:  Left hip with significant edema and pain, slightly warm. Wound dressed with no apparent drainage through dressing. Left calf sl tender to palpation. Left  leg length discrepancy of 1/4" to 1/2". Right hip sl tender with AROM/PROM  Neurological: She is alert and oriented to person, place, and time. No cranial nerve deficit.  Alert in no acute distress follows full commands. UE 5/5. LLE 1/5 HF, KE 4/5 ADF/PF. RLE: 3/5 HF, KE and 5/5 ADF/PF  Skin: Skin is warm.  Psychiatric: She has a normal mood and affect. Her behavior is normal.    Results for orders placed or performed during the hospital encounter of 04/29/18 (from the past 24 hour(s))  CBC     Status: Abnormal   Collection Time: 05/01/18  6:54 AM  Result Value Ref Range   WBC 13.3 (H) 4.0 - 10.5 K/uL   RBC 3.23 (L) 3.87 - 5.11 MIL/uL   Hemoglobin 9.9 (L) 12.0 - 15.0 g/dL   HCT 31.0 (L) 36.0 - 46.0 %   MCV 96.0 78.0 - 100.0 fL   MCH 30.7 26.0 - 34.0 pg   MCHC 31.9 30.0 - 36.0 g/dL   RDW 14.7 11.5 - 15.5 %   Platelets 206  150 - 400 K/uL   Dg C-arm 1-60 Min  Result Date: 04/29/2018 CLINICAL DATA:  Left hip revision FLUOROSCOPY TIME:  5 seconds. Images: 2 EXAM: OPERATIVE LEFT HIP (WITH PELVIS IF PERFORMED) to VIEWS TECHNIQUE: Fluoroscopic spot image(s) were submitted for interpretation post-operatively. COMPARISON:  None. FINDINGS: The patient is status post left hip revision. Only a portion of the acetabular component is identified but is grossly unremarkable. The superior most aspect of the femoral component is not included on today's study. No evidence of dislocation on AP imaging. Four proximal cerclage wires are identified. The distal most tip of the femoral component is not visualized. IMPRESSION: 1. Left hip revision as above. The superior and inferior most aspects of the hardware are not included on this study. Within visualized limits, no acute abnormalities. Electronically Signed   By: Dorise Bullion III M.D   On: 04/29/2018 15:58   Dg Hip Unilat With Pelvis 1v Left  Result Date: 04/29/2018 CLINICAL DATA:  Postop EXAM: DG HIP (WITH OR WITHOUT PELVIS) 1V*L* COMPARISON:  04/29/2018  FINDINGS: Prior right hip replacement with intact hardware and normal alignment. Interval revision of left hip replacement. Two supra-acetabular fixating screws. Cerclage wires at the proximal femur cortical bone defect with adjacent radiopaque material outer aspect of the femoral stem. Gas in the soft tissues consistent with recent surgery. IMPRESSION: 1. Interval revision of left hip replacement with normal alignment. Focal cortical defect proximal shaft of the femur on the outer side with adjacent radiopaque material. 2. Prior right hip replacement with normal alignment. Electronically Signed   By: Donavan Foil M.D.   On: 04/29/2018 17:40   Dg Hip Operative Unilat W Or W/o Pelvis Left  Result Date: 04/29/2018 CLINICAL DATA:  Left hip revision FLUOROSCOPY TIME:  5 seconds. Images: 2 EXAM: OPERATIVE LEFT HIP (WITH PELVIS IF PERFORMED) to VIEWS TECHNIQUE: Fluoroscopic spot image(s) were submitted for interpretation post-operatively. COMPARISON:  None. FINDINGS: The patient is status post left hip revision. Only a portion of the acetabular component is identified but is grossly unremarkable. The superior most aspect of the femoral component is not included on today's study. No evidence of dislocation on AP imaging. Four proximal cerclage wires are identified. The distal most tip of the femoral component is not visualized. IMPRESSION: 1. Left hip revision as above. The superior and inferior most aspects of the hardware are not included on this study. Within visualized limits, no acute abnormalities. Electronically Signed   By: Dorise Bullion III M.D   On: 04/29/2018 15:58     Assessment/Plan: Diagnosis: Left total hip arthoplasty metalosis s/p left total hip arthroplasty, posterior approach with osteotomy of femur, cable fixation. Pt with left leg length discrepancy, right hip arthroplasty/pain related to this as well 1. Does the need for close, 24 hr/day medical supervision in concert with the patient's  rehab needs make it unreasonable for this patient to be served in a less intensive setting? Yes 2. Co-Morbidities requiring supervision/potential complications: pain mgt, wound care, Acute blood loss anemia, leukoctyosis, orthotic evaluation for leg length discrepancy 3. Due to bladder management, bowel management, safety, skin/wound care, disease management, medication administration, pain management and patient education, does the patient require 24 hr/day rehab nursing? Yes 4. Does the patient require coordinated care of a physician, rehab nurse, PT (1-2 hrs/day, 5 days/week) and OT (1-2 hrs/day, 5 days/week) to address physical and functional deficits in the context of the above medical diagnosis(es)? Yes Addressing deficits in the following areas: balance, endurance, locomotion, strength, transferring,  bowel/bladder control, bathing, dressing, feeding, grooming, toileting and psychosocial support 5. Can the patient actively participate in an intensive therapy program of at least 3 hrs of therapy per day at least 5 days per week? Yes 6. The potential for patient to make measurable gains while on inpatient rehab is excellent 7. Anticipated functional outcomes upon discharge from inpatient rehab are modified independent  with PT, modified independent with OT, n/a with SLP. 8. Estimated rehab length of stay to reach the above functional goals is: 7-11 days 9. Anticipated D/C setting: Home 10. Anticipated post D/C treatments: [place] therapy 11. Overall Rehab/Functional Prognosis: excellent  RECOMMENDATIONS: This patient's condition is appropriate for continued rehabilitative care in the following setting: CIR Patient has agreed to participate in recommended program. Yes Note that insurance prior authorization may be required for reimbursement for recommended care.  Comment: Rehab Admissions Coordinator to follow up.  Thanks,  Meredith Staggers, MD, Mellody Drown  I have personally performed a face to  face diagnostic evaluation of this patient. Additionally, I have reviewed and concur with the physician assistant's documentation above.    Lavon Paganini Angiulli, PA-C 05/01/2018

## 2018-05-01 NOTE — Progress Notes (Signed)
Inpatient Rehabilitation  Met with patient at bedside to discuss team's recommendation for IP Rehab.  Shared booklets, insurance verification letter, and answered questions.  Patient in agreement with plans.  Note medical clearace per PA note.  We have a bed to offer and plan to proceed with admission today.  Called to request discharge order and summary.  Will update team, process, and proceed with admission to CIR today.  Call if questions.   Carmelia Roller., CCC/SLP Admission Coordinator  [place]  Cell [phone]

## 2018-05-01 NOTE — Care Management Note (Signed)
Case Management Note  Patient Details  Name: Emma Lewis MRN: [id number] Date of Birth: [birth year]  Subjective/Objective:                 L hip   Action/Plan:  Spoke with patient at bedside. Discussed options if CIR not approved. Patient stated that she has had [facility] in the past. Discussed level of support at [facility], SNF, and [place]. She states that she does not want to go to one specific SNF in [facility] but is agreeable to consider other SNFs closer to the [place] area. Reinforced with her that she will have the ability to choose SNF and would not have to go to one she did not agree on. Updated CSW.   Expected Discharge Date:                  Expected Discharge Plan:  Skilled Nursing Facility  In-House Referral:  Clinical Social Work  Discharge planning Services  CM Consult  Post Acute Care Choice:    Choice offered to:     DME Arranged:    DME Agency:     HH Arranged:    [place] Agency:     Status of Service:  In process, will continue to follow  If discussed at Long Length of Stay Meetings, dates discussed:    Additional Comments:  Carles Collet, RN 05/01/2018, 12:09 PM

## 2018-05-01 NOTE — Telephone Encounter (Signed)
Melissa from [facility] called and states she is aware that Dr Lorin Mercy is out of town and needs Jeneen Rinks PA to help with patient Emma Lewis  located on [address]. Okay to move to CIR. Needs D/C orders/summary.  CB [phone]

## 2018-05-01 NOTE — Progress Notes (Signed)
CSW notified of patient being accepted and admitted to CIR, no further social work needs identified. CSW signing off.   [place], [place]

## 2018-05-01 NOTE — Telephone Encounter (Signed)
Patient's husband Bland Span called stating he spoke with his attorney and the attorney asked if the rod from his wife's surgery could be retained for future possible legal action. The number to contact Bland Span is [phone] or [phone]

## 2018-05-01 NOTE — PMR Pre-admission (Signed)
PMR Admission Coordinator Pre-Admission Assessment  Patient: Emma Lewis is an 67 y.o., female MRN: [id number] DOB: 08-11-1950 Height: 5\' 5"  (165.1 cm) Weight: 86.3 kg              Insurance Information HMO:     PPO:      PCP:      IPA:      80/20:      OTHER:  PRIMARY: Medicare A & B      Policy#: [id number]      Subscriber: Self Employer: Retired  Benefits:  Phone #: Verified online     Name: [employer]  Eff. Date: A:03/29/12 B:03/29/14      Deduct: $1364      Out of Pocket Max: N/A      Life Max: N/A CIR: 100%      SNF: 100% days 1-20; 80% days 21-100 Outpatient: 80%     Co-Pay: 20% Home Health: 100%      Co-Pay: $0 DME: 80%     Co-Pay: 20% Providers: Patient's Choice   SECONDARY: [place]       Policy#: [id number]      Subscriber: Self Employer: Retired  Benefits:  Phone #: [phone]       Emergency [place]    Name Relation Home Work Mobile   Maniaci,Isaac Spouse [phone]  [phone]   Elianys, Conry [phone]  [phone]     Current Medical History  Patient Admitting Diagnosis: Left total hip arthoplasty metalosis s/p left total hip arthroplasty, posterior approach with osteotomy of femur, cable fixation. Pt with left leg length discrepancy, right hip arthroplasty/pain related to this as well.  History of Present Illness: Emma Lewis is a 67 year old right-handed female history of prediabetes, right total hip arthroplasty 01/21/2018 as well as a left total hip replacement 2011 with revision 2017 per Dr. Lyla Glassing.  Per chart review lives with spouse independent with assistive device prior to admission.  One level home with 3 steps to entry.  Presented 04/29/2018 with increasing left hip pain.  No change with conservative care.  X-rays show calcar resorption loosening around the acetabular shell.  Underwent left total hip arthroplasty revision posterior approach of all components acetabular and femur 04/29/2018 per Dr. Lorin Mercy.   Hospital course pain management.  Touchdown weightbearing left lower extremity with posterior hip precautions.  Placed on aspirin for DVT prophylaxis.  Venous Doppler studies negative.  Acute blood loss anemia 9.9.  Therapy evaluations completed with recommendations of physical medicine rehab consult.  Patient was admitted for a comprehensive rehab program 05/01/18.      Past Medical History  Past Medical History:  Diagnosis Date  . Arthritis    "all over my body" (04/29/2018)  . Chronic lower back pain   . Fatty liver   . Fever blister    Takes Acyclovir  . GERD (gastroesophageal reflux disease)   . Hyperlipemia   . Hypothyroidism   . Migraines   . Osteopenia   . Pneumonia    "once; years ago" (04/29/2018)  . PONV (postoperative nausea and vomiting)   . Pre-diabetes   . Seasonal allergies   . Vertigo     Family History  family history includes Lymphoma in her mother.  Prior Rehab/Hospitalizations:  Has the patient had major surgery during 100 days prior to admission? Yes  Current Medications   Current Facility-Administered Medications:  .  0.9 %  sodium chloride infusion, , Intravenous, Continuous, Lanae Crumbly, PA-C, Last  Rate: 85 mL/hr at 04/30/18 1845 .  acetaminophen (TYLENOL) tablet 325-650 mg, 325-650 mg, Oral, Q6H PRN, Lanae Crumbly, PA-C .  aspirin EC tablet 325 mg, 325 mg, Oral, Q breakfast, Benjiman Core M, PA-C, 325 mg at 05/01/18 1001 .  cholecalciferol (VITAMIN D) tablet 2,000 Units, 2,000 Units, Oral, BID, Lanae Crumbly, PA-C, 2,000 Units at 05/01/18 1000 .  docusate sodium (COLACE) capsule 100 mg, 100 mg, Oral, BID, Benjiman Core M, PA-C, 100 mg at 05/01/18 1000 .  lactated ringers infusion, , Intravenous, Continuous, Effie Berkshire, MD, Last Rate: 50 mL/hr at 04/29/18 1035 .  levothyroxine (SYNTHROID, LEVOTHROID) tablet 100 mcg, 100 mcg, Oral, QAC breakfast, Lanae Crumbly, PA-C, 100 mcg at 05/01/18 0601 .  meclizine (ANTIVERT) tablet 25 mg, 25 mg, Oral, q  morning - 10a, Lanae Crumbly, PA-C, 25 mg at 05/01/18 1000 .  menthol-cetylpyridinium (CEPACOL) lozenge 3 mg, 1 lozenge, Oral, PRN **OR** phenol (CHLORASEPTIC) mouth spray 1 spray, 1 spray, Mouth/Throat, PRN, Benjiman Core M, PA-C .  metoCLOPramide (REGLAN) tablet 5-10 mg, 5-10 mg, Oral, Q8H PRN **OR** metoCLOPramide (REGLAN) injection 5-10 mg, 5-10 mg, Intravenous, Q8H PRN, Benjiman Core M, PA-C .  ondansetron [facility]) tablet 4 mg, 4 mg, Oral, Q6H PRN **OR** ondansetron (ZOFRAN) injection 4 mg, 4 mg, Intravenous, Q6H PRN, Benjiman Core M, PA-C .  oxyCODONE (Oxy IR/ROXICODONE) immediate release tablet 5-10 mg, 5-10 mg, Oral, Q6H PRN, Lanae Crumbly, PA-C, 10 mg at 05/01/18 0601 .  pantoprazole (PROTONIX) EC tablet 40 mg, 40 mg, Oral, Daily, Benjiman Core M, PA-C, 40 mg at 05/01/18 1000 .  polyethylene glycol (MIRALAX / GLYCOLAX) packet 17 g, 17 g, Oral, Daily PRN, Benjiman Core M, PA-C .  polyvinyl alcohol (LIQUIFILM TEARS) 1.4 % ophthalmic solution 1 drop, 1 drop, Both Eyes, PRN, [name], MD .  simvastatin (ZOCOR) tablet 40 mg, 40 mg, Oral, QHS, Benjiman Core M, PA-C, 40 mg at 04/30/18 2302  Patients Current Diet:  Diet Order            Diet regular Room service appropriate? Yes; Fluid consistency: Thin  Diet effective now              Precautions / Restrictions Precautions Precautions: Fall, Posterior Hip Precaution Booklet Issued: Yes (comment) Precaution Comments: Reviewed posterior hip precautions with patient.   Restrictions Weight Bearing Restrictions: Yes LLE Weight Bearing: Touchdown weight bearing   Has the patient had 2 or more falls or a fall with injury in the past year?No  Prior Activity Level Community (5-7x/wk): Prior to admission patient was independent with rolling walker except for some assist with socks and shoes.  She was driving and enjoyes her dog Gracie.    Home Assistive Devices / Equipment Home Assistive Devices/Equipment: Kasandra Knudsen (specify quad or straight),  Built-in shower seat, Eyeglasses Home Equipment: Walker - 2 wheels, Cane - single point, Bedside commode, Crutches, Shower seat - built in  Prior Device Use: Indicate devices/aids used by the patient prior to current illness, exacerbation or injury? Walker  Prior Functional Level Prior Function Level of Independence: Independent with assistive device(s) Comments: using RW, husband assisting with some LB dressing (socks)  Self Care: Did the patient need help bathing, dressing, using the toilet or eating? Needed some help with donning socks and shoes   Indoor Mobility: Did the patient need assistance with walking from room to room (with or without device)? Independent  Stairs: Did the patient need assistance with internal or external stairs (with  or without device)? Independent  Functional Cognition: Did the patient need help planning regular tasks such as shopping or remembering to take medications? Independent  Current Functional Level Cognition  Overall Cognitive Status: Within Functional Limits for tasks assessed Orientation Level: Oriented X4    Extremity Assessment (includes Sensation/Coordination)  Upper Extremity Assessment: Generalized weakness, RUE deficits/detail RUE Deficits / Details: reports 3 shoulder surgeries on R side  Lower Extremity Assessment: Defer to PT evaluation LLE Deficits / Details: Decreased strength and AROM consistent with above mentioned surgery.  LLE: Unable to fully assess due to pain, Unable to fully assess due to immobilization    ADLs  Overall ADL's : Needs assistance/impaired Eating/Feeding: Set up, Sitting Grooming: Set up, Sitting Upper Body Bathing: Set up, Sitting Lower Body Bathing: Total assistance, Sit to/from stand, Sitting/lateral leans, Adhering to hip precautions, Cueing for compensatory techniques, With adaptive equipment Upper Body Dressing : Set up, Sitting Lower Body Dressing: Total assistance, Sit to/from stand, Adhering to hip  precautions, Sitting/lateral leans, Cueing for compensatory techniques, With adaptive equipment Toilet Transfer: Total assistance, BSC, Adhering to hip precautions, RW Toileting- Clothing Manipulation and Hygiene: Moderate assistance, Sit to/from stand, Adhering to hip precautions Tub/ Shower Transfer: Total assistance, Adhering to hip precautions, Shower seat, Rolling walker Functional mobility during ADLs: Maximal assistance, Total assistance, Rolling walker General ADL Comments: Pt currently limited by pain to safely complete functional mobility during ADLs. Pt unable to recall hip precautions despite visual cue and reminders.     Mobility  Overal bed mobility: Needs Assistance Bed Mobility: Supine to Sit Supine to sit: Mod assist, +2 for safety/equipment General bed mobility comments: Pt required assistance to advance B LEs to edge of bed and elevate trunk into sitting.  Completed transfer via helicopter style.  Pt able to scoot forward to edge of bed with minimal assistance once seated more upright.      Transfers  Overall transfer level: Needs assistance Equipment used: Rolling walker (2 wheeled) Transfers: Sit to/from Stand, [employer] Transfers Sit to Stand: Mod assist, +2 safety/equipment Stand pivot transfers: Mod assist, +2 physical assistance General transfer comment: Pt clearly not able to maintain during transition into standing.  With cueing and assistance she is able to maintain weight bearing while in standing and during pivot to [facility] from bed.  Pt required assistance to advance LLE forward during descent and max VCs for hand placement.  Once finished toileting she was more fatigued and required max +2 to stand and had to have the bed side commode removed from behind her and the recliner put in it's placed.      Ambulation / Gait / Stairs / Wheelchair Mobility  Ambulation/Gait Ambulation/Gait assistance: (Pt remains unable due to fatigue and weakness.  ) General Gait Details:  Unable at this time.     Posture / Balance Dynamic Sitting Balance Sitting balance - Comments: not formally tested this date, up in recliner with significant support Balance Overall balance assessment: Needs assistance Sitting-balance support: Feet supported, Bilateral upper extremity supported Sitting balance-Leahy Scale: Poor Sitting balance - Comments: not formally tested this date, up in recliner with significant support Standing balance support: Bilateral upper extremity supported, During functional activity Standing balance-Leahy Scale: Poor Standing balance comment: Max assist required.     Special needs/care consideration BiPAP/CPAP: No CPM: No Continuous Drip IV: No Dialysis: No         Life Vest: No Oxygen: No Special Bed: No Trach Size: No Wound Vac (area): No  Skin: Dry and Monitor surgical incision                               Bowel mgmt: Continent, last BM 04/29/18 Bladder mgmt: Continent with some more urgency  Diabetic mgmt: No     Previous Home Environment Living Arrangements: Spouse/significant other Available Help at Discharge: Family, Available 24 hours/day Type of Home: House Home Layout: One level Home Access: Stairs to enter Entrance Stairs-Rails: Left Entrance Stairs-Number of Steps: 3 in front, 8 in back Bathroom Shower/Tub: Multimedia programmer: Associate Professor Accessibility: Yes [place]: No  Discharge Living Setting Plans for Discharge Living Setting: Patient's home, Lives with (comment)(Spouse and dog) Type of Home at Discharge: House Discharge Home Layout: One level Discharge Home Access: Stairs to enter Entrance Stairs-Rails: Left Entrance Stairs-Number of Steps: 3 Discharge Bathroom Shower/Tub: Walk-in shower, Door(built in bench ) Discharge Bathroom Toilet: Handicapped height Discharge Bathroom Accessibility: Yes How Accessible: Accessible via walker Does the patient have any problems obtaining your  medications?: No  Social/Family/Support Systems Patient Roles: Spouse Contact Information: Spouse: Chief Financial Officer Anticipated Caregiver: Spouse Anticipated Caregiver's Contact Information: see above Ability/Limitations of Caregiver: Works part time  Careers adviser: Intermittent Discharge Plan Discussed with Primary Caregiver: (Discussed with patient ) Is Caregiver In Agreement with Plan?: (Patient in agreement with plan ) Does Caregiver/Family have Issues with Lodging/Transportation while Pt is in Rehab?: No  Goals/Additional Needs Patient/Family Goal for Rehab: PT/OT: Mod I  Expected length of stay: 7-11 days  Cultural Considerations: [facility]  Dietary Needs: Regular textures and thin liquids  Equipment Needs: TBD Pt/Family Agrees to Admission and willing to participate: Yes Program Orientation Provided & Reviewed with Pt/Caregiver Including Roles  & Responsibilities: Yes  Barriers to Discharge: Decreased caregiver support(Mod I goals as spouse works part-time )  Decrease burden of Care through [employer] rehab admission: No  Possible need for SNF placement upon discharge: No  Patient Condition: This patient's condition remains as documented in the consult dated 05/01/18, in which the Rehabilitation Physician determined and documented that the patient's condition is appropriate for intensive rehabilitative care in an inpatient rehabilitation facility. Will admit to inpatient rehab today.  Preadmission Screen Completed By:  Gunnar Fusi, 05/01/2018 2:00 PM ______________________________________________________________________   Discussed status with Dr. Naaman Plummer on 05/01/18 at 1400 and received telephone approval for admission today.  Admission Coordinator:  Gunnar Fusi, time 1400/Date 05/01/18

## 2018-05-01 NOTE — H&P (Signed)
Physical Medicine and Rehabilitation Admission H&P  HPI: Emma Lewis is a 67 year old right-handed female history of prediabetes, right total hip arthroplasty 01/21/2018 as well as a left total hip replacement 2011 with revision 2017 per Dr. Lyla Glassing. Per chart review lives with spouse independent with assistive device prior to admission. One level home with 3 steps to entry. Presented 04/29/2018 with increasing left hip pain. No change with conservative care. X-rays show calcar resorption loosening around the acetabular shell. Underwent left total hip arthroplasty revision posterior approach of all components acetabular and femur 04/29/2018 per Dr. Lorin Mercy. Hospital course pain management. Touchdown weightbearing left lower extremity with posterior hip precautions. Placed on aspirin for DVT prophylaxis. Venous Doppler studies negative. Acute blood loss anemia 9.9. Therapy evaluations completed with recommendations of physical medicine rehab consult. Patient was admitted for a comprehensive rehab program.  Review of Systems  Constitutional: Negative for chills and fever.  HENT: Negative for hearing loss.  Eyes: Negative for blurred vision and double vision.  Respiratory: Negative for shortness of breath.  Cardiovascular: Negative for chest pain and palpitations.  Gastrointestinal: Positive for constipation. Negative for nausea and vomiting.  GERD  Genitourinary: Negative for dysuria, flank pain and hematuria.  Musculoskeletal: Positive for joint pain and myalgias.  Skin: Negative for rash.  Neurological: Positive for dizziness and headaches.  All other systems reviewed and are negative.       Past Medical History:  Diagnosis Date  . Arthritis    "all over my body" (04/29/2018)  . Chronic lower back pain   . Fatty liver   . Fever blister    Takes Acyclovir  . GERD (gastroesophageal reflux disease)   . Hyperlipemia   . Hypothyroidism   . Migraines   . Osteopenia   . Pneumonia    "once;  years ago" (04/29/2018)  . PONV (postoperative nausea and vomiting)   . Pre-diabetes   . Seasonal allergies   . Vertigo         Past Surgical History:  Procedure Laterality Date  . ANTERIOR CERVICAL DECOMP/DISCECTOMY FUSION N/A 02/09/2014   Procedure: ANTERIOR CERVICAL DECOMPRESSION/DISCECTOMY FUSION 2 LEVELS; Surgeon: [name], MD; Location: [facility]; Service: Orthopedics; Laterality: N/A; C4-5, C5-6 Anterior Cervical Discectomy and Fusion, Allograft, Plate  . BACK SURGERY    . CARPAL TUNNEL RELEASE Right 2018  . CATARACT EXTRACTION W/ INTRAOCULAR LENS IMPLANT, BILATERAL    . COLONOSCOPY    . FIXATION KYPHOPLASTY LUMBAR SPINE  X 2  . HIP ARTHROPLASTY Left 2011  . JOINT REPLACEMENT    . KNEE ARTHROSCOPY Right   . PATELLA FRACTURE SURGERY Right ~ 1990   "put metal in"  . PATELLA HARDWARE REMOVAL Right    "took the metal out"  . REVISION TOTAL HIP ARTHROPLASTY Left 04/29/2018  . ROBOTIC ASSISTED BILATERAL SALPINGO OOPHERECTOMY Bilateral 08/01/2015   Procedure: ROBOTIC ASSISTED BILATERAL SALPINGO OOPHORECTOMY; Surgeon: Everitt Amber, MD; Location: [facility]; Service: Gynecology; Laterality: Bilateral;  . SHOULDER OPEN ROTATOR CUFF REPAIR Right    3 TOTAL  . SHOULDER SURGERY Right   . TOTAL HIP ARTHROPLASTY Right 01/21/2018   Procedure: RIGHT TOTAL HIP ARTHROPLASTY DIRECT ANTERIOR; Surgeon: [name], MD; Location: [facility]; Service: Orthopedics; Laterality: Right;  . TOTAL HIP REVISION Left 06/10/2016   Procedure: TOTAL HIP REVISION ARTHROPLASTY; Surgeon: Rod Can, MD; Location: [facility]; Service: Orthopedics; Laterality: Left;  . TOTAL HIP REVISION Left 04/29/2018   Procedure: left total hip arthroplasty revision posterior approach; Surgeon: [name], MD; Location: [facility]  OR; Service: Orthopedics; Laterality: Left;  . WRIST GANGLION EXCISION Left 1970s        Family History  Problem Relation Age of Onset  . Lymphoma Mother    Social History: reports that she has never smoked. She  has never used smokeless tobacco. She reports that she drinks alcohol. She reports that she does not use drugs.  Allergies:       Allergies  Allergen Reactions  . Sulfa Antibiotics Other (See Comments)    UNSPECIFIED REACTION > Childhood allergy  Mother said "I liked to have died"  . Fenofibrate Rash    She didn't feel right         Medications Prior to Admission  Medication Sig Dispense Refill  . acyclovir (ZOVIRAX) 400 MG tablet Take 400 mg by mouth daily.    . Calcium Carb-Cholecalciferol (CALCIUM 600 + D PO) Take 1 tablet by mouth 3 (three) times daily.     . Carboxymethylcellulose Sodium (THERATEARS) 0.25 % SOLN Place 1 drop into both eyes 2 (two) times daily as needed (dry eyes).    . Cholecalciferol (VITAMIN D3) 2000 units TABS Take 2,000 Units by mouth 2 (two) times daily.     Marland Kitchen ibuprofen (ADVIL,MOTRIN) 200 MG tablet Take 800 mg by mouth every 6 (six) hours as needed (pain).    Marland Kitchen levothyroxine (SYNTHROID, LEVOTHROID) 100 MCG tablet Take 100 mcg by mouth daily before breakfast.     . meclizine (ANTIVERT) 25 MG tablet Take 1 tablet (25 mg total) by mouth 3 (three) times daily as needed for dizziness. Vertigo. Also available OTC (Patient taking differently: Take 25 mg by mouth every morning. Vertigo. Also available OT) 90 tablet 3  . Multiple Vitamins-Minerals (EMERGEN-C VITAMIN C) PACK Take 1 Package by mouth daily.    . mupirocin ointment (BACTROBAN) 2 % Use as directed. (Patient taking differently: Place 1 application into the nose 2 (two) times daily. Use as directed.) 22 g 0  . Omega-3 Fatty Acids (OMEGA-3 FISH OIL PO) Take 1 capsule by mouth 3 (three) times daily. 1,040mg  each     . omeprazole (PRILOSEC) 20 MG capsule Take 10 mg by mouth daily.     . phentermine 37.5 MG capsule Take 37.5 mg by mouth every morning.  0  . simvastatin (ZOCOR) 40 MG tablet Take 40 mg by mouth at bedtime.     Marland Kitchen Specialty Vitamins Products (BIOTIN PLUS KERATIN) 10000-100 MCG-MG TABS Take 1 tablet by  mouth 3 (three) times daily.     . TURMERIC CURCUMIN PO Take 1 capsule by mouth 2 (two) times daily. 500mg  each    . alendronate (FOSAMAX) 70 MG tablet Take 70 mg by mouth every Sunday.  1  . clobetasol cream (TEMOVATE) 5.10 % Apply 1 application topically 2 (two) times daily. (Patient taking differently: Apply 1 application topically daily as needed (irritation). ) 30 g 1  . nystatin-triamcinolone (MYCOLOG II) cream Apply 1 application topically 2 (two) times daily as needed (irritation).   3   Drug Regimen Review  Drug regimen was reviewed and remains appropriate with no significant issues identified  Home:  Home Living  Family/patient expects to be discharged to:: Private residence  Living Arrangements: Spouse/significant other  Available Help at Discharge: Family, Available 24 hours/day  Type of Home: House  Home Access: Stairs to enter  [employer] of Steps: 3 in front, 8 in back  Entrance Stairs-Rails: Left  Home Layout: One level  Bathroom Shower/Tub: Tourist information centre manager: Standard  Bathroom Accessibility: Yes  Home Equipment: Environmental consultant - 2 wheels, Cane - single point, Bedside commode, Crutches, Shower seat - built in  Functional History:  Prior Function  Level of Independence: Independent with assistive device(s)  Comments: using RW, husband assisting with some LB dressing (socks)  Functional Status:  Mobility:  Bed Mobility  Overal bed mobility: Needs Assistance  Bed Mobility: Supine to Sit  Supine to sit: Mod assist, HOB elevated  General bed mobility comments: VC's for sequencing and use of bed rails for support. Pt required mod assist for LLE advancement towards EOB as well as trunk stability with transition to EOB.  Transfers  Overall transfer level: Needs assistance  Equipment used: Rolling walker (2 wheeled)  Transfers: Sit to/from Stand, [employer] Transfers  Sit to Stand: Mod assist  Stand pivot transfers: Max assist  General transfer  comment: Pt up in chair upon arrival, transferred not tested this date due to meal arrival  Ambulation/Gait  General Gait Details: Unable at this time.   ADL:  ADL  Overall ADL's : Needs assistance/impaired  Eating/Feeding: Set up, Sitting  Grooming: Set up, Sitting  Upper Body Bathing: Set up, Sitting  Lower Body Bathing: Total assistance, Sit to/from stand, Sitting/lateral leans, Adhering to hip precautions, Cueing for compensatory techniques, With adaptive equipment  Upper Body Dressing : Set up, Sitting  Lower Body Dressing: Total assistance, Sit to/from stand, Adhering to hip precautions, Sitting/lateral leans, Cueing for compensatory techniques, With adaptive equipment  Toilet Transfer: Total assistance, BSC, Adhering to hip precautions, RW  Toileting- Clothing Manipulation and Hygiene: Moderate assistance, Sit to/from stand, Adhering to hip precautions  Tub/ Shower Transfer: Total assistance, Adhering to hip precautions, Shower seat, Rolling walker  Functional mobility during ADLs: Maximal assistance, Total assistance, Rolling walker  General ADL Comments: Pt currently limited by pain to safely complete functional mobility during ADLs. Pt unable to recall hip precautions despite visual cue and reminders.  Cognition:  Cognition  Overall Cognitive Status: Within Functional Limits for tasks assessed  Orientation Level: Oriented X4  Cognition  Arousal/Alertness: Awake/alert  Behavior During Therapy: WFL for tasks assessed/performed    Overall Cognitive Status: Within Functional Limits for tasks assessed  Physical Exam:  Blood pressure 108/60, pulse 90, temperature 98.2 F (36.8 C), temperature source Oral, resp. rate 16, height 5\' 5"  (1.651 m), weight 86.3 kg, SpO2 92 %.  Physical Exam  Constitutional: She is oriented to person, place, and time. She appears well-developed. No distress.  HENT:  Head: Normocephalic.  Eyes: Pupils are equal, round, and reactive to light.  Neck:  Normal range of motion.  Cardiovascular: Normal rate and regular rhythm.  No murmur heard.  Respiratory: Effort normal. No respiratory distress. She has no wheezes.  GI: Soft. She exhibits no distension.  Musculoskeletal:  Left hip swollen and tender with palpation and attempts at AROM/PROM. Right hip with mild tenderness during AROM/PROM. 1/4" to 1/2" left leg length discrepancy. Calf tender also  Neurological: She is alert and oriented to person, place, and time. No cranial nerve deficit.  Patient is alert in no acute distress. Follows full commands. UE 5/5. RLE 3+/5 to 5/5 prox to distal. LLE 1/5 HF, KE (pain), 4/5 ADF/PF. No focal sensory findings.  Skin:  Hip incision is dressed. Appropriately tender   Lab Results Last 48 Hours  Imaging Results (Last 48 hours)     Medical Problem List and Plan:  1. 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 Touchdown weightbearing as well as history of right total hip arthroplasty  -admit to inpatient rehab  2. DVT Prophylaxis/Anticoagulation: SCDs.  -recent Venous Doppler studies negative  3. Pain Management: Oxycodone as needed  -ice to left hip  -consider scheduled pain medication prior to therapies if pain is prohibitive.  4. Mood: Provide emotional support  5. Neuropsych: This patient is capable of making decisions on her own behalf.  6. Skin/Wound Care: Routine skin checks  -maintain current dressing to left hip  7. Fluids/Electrolytes/Nutrition: Routine in and outs with follow-up chemistries ordered.  8.  Acute blood loss anemia. Follow-up CBC  -no active signs of blood loss  9. Hypothyroidism. Synthroid  10. Hyperlipidemia. Zocor  11. Constipation. scheduled soft/laxative  12. GERD. Protonix  13. Prediabetes. Hemoglobin A1c 5.8  14. Leukocytosis: WBC's 13.3 today  -reactive?  -monitor temperature curve, wounds  -consider UA/UCX if symptomatic   Post Admission Physician Evaluation:  1. Functional deficits secondary to metallosis left hip s/p complicated revision. 2. Patient is admitted to receive collaborative, interdisciplinary care between the physiatrist, rehab nursing staff, and therapy team. 3. Patient's level of medical complexity and substantial therapy needs in context of that medical necessity cannot be provided at a lesser intensity of care such as a SNF. 4. Patient has experienced substantial functional loss from his/her baseline which was documented above under the "Functional History" and "Functional Status" headings. Judging by the patient's diagnosis, physical exam, and functional history, the patient has potential for functional progress which will result in measurable gains while on inpatient rehab. These gains will be of substantial and practical use upon discharge in facilitating mobility and self-care at the household level. 5. Physiatrist will provide 24 hour management of medical needs as well as oversight of the therapy plan/treatment and provide guidance as appropriate regarding the interaction of the two. 6. The Preadmission Screening has been reviewed and patient status is unchanged unless otherwise stated above. 7. 24 hour rehab nursing will assist with bladder management, bowel management, safety, skin/wound care, disease management, medication administration, pain management and patient education and help integrate therapy concepts, techniques,education, etc. 8. PT will assess and treat for/with: .Lower extremity strength, range of motion, stamina, balance, functional  mobility, safety, adaptive techniques and equipment, pain mgt, ortho precautions, wound care, community reentry. Goals are: mod I. 9. OT will assess and treat for/with: ADL's, functional mobility, safety, upper extremity strength, adaptive techniques and equipment, pain mgt, ortho precautions . Goals are: mod I. Therapy may proceed with showering this patient. 10. SLP will assess and treat for/with: n/a. Goals are: n/a. 11. Case Management and Social Worker will assess and treat for psychological issues and discharge planning. 12. Team conference will be held weekly to assess progress toward goals and to determine barriers to discharge. 13. Patient will receive at least 3 hours of therapy per day at least 5 days per week. 14. ELOS: 7-10 days  15. Prognosis: excellent   I have personally performed a face to face diagnostic evaluation of this patient and formulated the key components of the plan. Additionally, I have personally reviewed laboratory data, imaging studies, as well as relevant notes and concur with the physician assistant's documentation above.   Meredith Staggers, MD, FAAPMR  Lavon Paganini Mignon,  PA-C  05/01/2018  The patient's status has not changed. The original post admission physician evaluation remains appropriate, and any changes from the pre-admission screening or documentation from the acute chart are noted above.  Meredith Staggers, MD 05/01/2018

## 2018-05-01 NOTE — H&P (Signed)
Physical Medicine and Rehabilitation Admission H&P     HPI: Emma Lewis is a 67 year old right-handed female history of prediabetes, right total hip arthroplasty 01/21/2018 as well as a left total hip replacement 2011 with revision 2017 per Dr. Lyla Glassing.  Per chart review lives with spouse independent with assistive device prior to admission.  One level home with 3 steps to entry.  Presented 04/29/2018 with increasing left hip pain.  No change with conservative care.  X-rays show calcar resorption loosening around the acetabular shell.  Underwent left total hip arthroplasty revision posterior approach of all components acetabular and femur 04/29/2018 per Dr. Lorin Mercy.  Hospital course pain management.  Touchdown weightbearing left lower extremity with posterior hip precautions.  Placed on aspirin for DVT prophylaxis.  Venous Doppler studies negative.  Acute blood loss anemia 9.9.  Therapy evaluations completed with recommendations of physical medicine rehab consult.  Patient was admitted for a comprehensive rehab program.  Review of Systems  Constitutional: Negative for chills and fever.  HENT: Negative for hearing loss.   Eyes: Negative for blurred vision and double vision.  Respiratory: Negative for shortness of breath.   Cardiovascular: Negative for chest pain and palpitations.  Gastrointestinal: Positive for constipation. Negative for nausea and vomiting.       GERD  Genitourinary: Negative for dysuria, flank pain and hematuria.  Musculoskeletal: Positive for joint pain and myalgias.  Skin: Negative for rash.  Neurological: Positive for dizziness and headaches.  All other systems reviewed and are negative.  Past Medical History:  Diagnosis Date  . Arthritis    "all over my body" (04/29/2018)  . Chronic lower back pain   . Fatty liver   . Fever blister    Takes Acyclovir  . GERD (gastroesophageal reflux disease)   . Hyperlipemia   . Hypothyroidism   . Migraines   . Osteopenia   .  Pneumonia    "once; years ago" (04/29/2018)  . PONV (postoperative nausea and vomiting)   . Pre-diabetes   . Seasonal allergies   . Vertigo    Past Surgical History:  Procedure Laterality Date  . ANTERIOR CERVICAL DECOMP/DISCECTOMY FUSION N/A 02/09/2014   Procedure: ANTERIOR CERVICAL DECOMPRESSION/DISCECTOMY FUSION 2 LEVELS;  Surgeon: [name], MD;  Location: [facility];  Service: Orthopedics;  Laterality: N/A;  C4-5, C5-6 Anterior Cervical Discectomy and Fusion, Allograft, Plate  . BACK SURGERY    . CARPAL TUNNEL RELEASE Right 2018  . CATARACT EXTRACTION W/ INTRAOCULAR LENS  IMPLANT, BILATERAL    . COLONOSCOPY    . FIXATION KYPHOPLASTY LUMBAR SPINE  X 2  . HIP ARTHROPLASTY Left 2011  . JOINT REPLACEMENT    . KNEE ARTHROSCOPY Right   . PATELLA FRACTURE SURGERY Right ~ 1990   "put metal in"  . PATELLA HARDWARE REMOVAL Right    "took the metal out"  . REVISION TOTAL HIP ARTHROPLASTY Left 04/29/2018  . ROBOTIC ASSISTED BILATERAL SALPINGO OOPHERECTOMY Bilateral 08/01/2015   Procedure: ROBOTIC ASSISTED BILATERAL SALPINGO OOPHORECTOMY;  Surgeon: Everitt Amber, MD;  Location: [facility];  Service: Gynecology;  Laterality: Bilateral;  . SHOULDER OPEN ROTATOR CUFF REPAIR Right    3 TOTAL  . SHOULDER SURGERY Right   . TOTAL HIP ARTHROPLASTY Right 01/21/2018   Procedure: RIGHT TOTAL HIP ARTHROPLASTY DIRECT ANTERIOR;  Surgeon: [name], MD;  Location: [facility];  Service: Orthopedics;  Laterality: Right;  . TOTAL HIP REVISION Left 06/10/2016   Procedure: TOTAL HIP REVISION ARTHROPLASTY;  Surgeon: Rod Can, MD;  Location: [facility];  Service: Orthopedics;  Laterality: Left;  . TOTAL HIP REVISION Left 04/29/2018   Procedure: left total hip arthroplasty revision posterior approach;  Surgeon: [name], MD;  Location: [facility];  Service: Orthopedics;  Laterality: Left;  . WRIST GANGLION EXCISION Left 1970s   Family History  Problem Relation Age of Onset  . Lymphoma Mother    Social History:  reports  that she has never smoked. She has never used smokeless tobacco. She reports that she drinks alcohol. She reports that she does not use drugs. Allergies:  Allergies  Allergen Reactions  . Sulfa Antibiotics Other (See Comments)    UNSPECIFIED REACTION > Childhood allergy Mother said "I liked to have died"  . Fenofibrate Rash    She didn't feel right   Medications Prior to Admission  Medication Sig Dispense Refill  . acyclovir (ZOVIRAX) 400 MG tablet Take 400 mg by mouth daily.    . Calcium Carb-Cholecalciferol (CALCIUM 600 + D PO) Take 1 tablet by mouth 3 (three) times daily.     . Carboxymethylcellulose Sodium (THERATEARS) 0.25 % SOLN Place 1 drop into both eyes 2 (two) times daily as needed (dry eyes).    . Cholecalciferol (VITAMIN D3) 2000 units TABS Take 2,000 Units by mouth 2 (two) times daily.     Marland Kitchen ibuprofen (ADVIL,MOTRIN) 200 MG tablet Take 800 mg by mouth every 6 (six) hours as needed (pain).    Marland Kitchen levothyroxine (SYNTHROID, LEVOTHROID) 100 MCG tablet Take 100 mcg by mouth daily before breakfast.     . meclizine (ANTIVERT) 25 MG tablet Take 1 tablet (25 mg total) by mouth 3 (three) times daily as needed for dizziness. Vertigo. Also available OTC (Patient taking differently: Take 25 mg by mouth every morning. Vertigo. Also available OT) 90 tablet 3  . Multiple Vitamins-Minerals (EMERGEN-C VITAMIN C) PACK Take 1 Package by mouth daily.    . mupirocin ointment (BACTROBAN) 2 % Use as directed. (Patient taking differently: Place 1 application into the nose 2 (two) times daily. Use as directed.) 22 g 0  . Omega-3 Fatty Acids (OMEGA-3 FISH OIL PO) Take 1 capsule by mouth 3 (three) times daily. 1,'040mg'$  each     . omeprazole (PRILOSEC) 20 MG capsule Take 10 mg by mouth daily.     . phentermine 37.5 MG capsule Take 37.5 mg by mouth every morning.  0  . simvastatin (ZOCOR) 40 MG tablet Take 40 mg by mouth at bedtime.     Marland Kitchen Specialty Vitamins Products (BIOTIN PLUS KERATIN) 10000-100 MCG-MG TABS  Take 1 tablet by mouth 3 (three) times daily.     . TURMERIC CURCUMIN PO Take 1 capsule by mouth 2 (two) times daily. '500mg'$  each    . alendronate (FOSAMAX) 70 MG tablet Take 70 mg by mouth every Sunday.  1  . clobetasol cream (TEMOVATE) 0.34 % Apply 1 application topically 2 (two) times daily. (Patient taking differently: Apply 1 application topically daily as needed (irritation). ) 30 g 1  . nystatin-triamcinolone (MYCOLOG II) cream Apply 1 application topically 2 (two) times daily as needed (irritation).   3    Drug Regimen Review Drug regimen was reviewed and remains appropriate with no significant issues identified  Home: Home Living Family/patient expects to be discharged to:: Private residence Living Arrangements: Spouse/significant other Available Help at Discharge: Family, Available 24 hours/day Type of Home: House Home Access: Stairs to enter [employer] of Steps: 3 in front, 8 in back Entrance Stairs-Rails: Left Home Layout:  One level Bathroom Shower/Tub: Multimedia programmer: Standard Bathroom Accessibility: Yes Home Equipment: Environmental consultant - 2 wheels, Cane - single point, Bedside commode, Crutches, Shower seat - built in   Functional History: Prior Function Level of Independence: Independent with assistive device(s) Comments: using RW, husband assisting with some LB dressing (socks)  Functional Status:  Mobility: Bed Mobility Overal bed mobility: Needs Assistance Bed Mobility: Supine to Sit Supine to sit: Mod assist, HOB elevated General bed mobility comments: VC's for sequencing and use of bed rails for support. Pt required mod assist for LLE advancement towards EOB as well as trunk stability with transition to EOB.  Transfers Overall transfer level: Needs assistance Equipment used: Rolling walker (2 wheeled) Transfers: Sit to/from Stand, [employer] Transfers Sit to Stand: Mod assist Stand pivot transfers: Max assist General transfer comment: Pt  up in chair upon arrival, transferred not tested this date due to meal arrival Ambulation/Gait General Gait Details: Unable at this time.     ADL: ADL Overall ADL's : Needs assistance/impaired Eating/Feeding: Set up, Sitting Grooming: Set up, Sitting Upper Body Bathing: Set up, Sitting Lower Body Bathing: Total assistance, Sit to/from stand, Sitting/lateral leans, Adhering to hip precautions, Cueing for compensatory techniques, With adaptive equipment Upper Body Dressing : Set up, Sitting Lower Body Dressing: Total assistance, Sit to/from stand, Adhering to hip precautions, Sitting/lateral leans, Cueing for compensatory techniques, With adaptive equipment Toilet Transfer: Total assistance, BSC, Adhering to hip precautions, RW Toileting- Clothing Manipulation and Hygiene: Moderate assistance, Sit to/from stand, Adhering to hip precautions Tub/ Shower Transfer: Total assistance, Adhering to hip precautions, Shower seat, Rolling walker Functional mobility during ADLs: Maximal assistance, Total assistance, Rolling walker General ADL Comments: Pt currently limited by pain to safely complete functional mobility during ADLs. Pt unable to recall hip precautions despite visual cue and reminders.   Cognition: Cognition Overall Cognitive Status: Within Functional Limits for tasks assessed Orientation Level: Oriented X4 Cognition Arousal/Alertness: Awake/alert Behavior During Therapy: WFL for tasks assessed/performed Overall Cognitive Status: Within Functional Limits for tasks assessed  Physical Exam: Blood pressure 108/60, pulse 90, temperature 98.2 F (36.8 C), temperature source Oral, resp. rate 16, height '5\' 5"'$  (1.651 m), weight 86.3 kg, SpO2 92 %. Physical Exam  Constitutional: She is oriented to person, place, and time. She appears well-developed. No distress.  HENT:  Head: Normocephalic.  Eyes: Pupils are equal, round, and reactive to light.  Neck: Normal range of motion.    Cardiovascular: Normal rate and regular rhythm.  No murmur heard. Respiratory: Effort normal. No respiratory distress. She has no wheezes.  GI: Soft. She exhibits no distension.  Musculoskeletal:  Left hip swollen and tender with palpation and attempts at AROM/PROM. Right hip with mild tenderness during AROM/PROM. 1/4" to 1/2" left leg length discrepancy. Calf tender also  Neurological: She is alert and oriented to person, place, and time. No cranial nerve deficit.  Patient is alert in no acute distress.  Follows full commands. UE 5/5. RLE 3+/5 to 5/5 prox to distal. LLE 1/5 HF, KE (pain), 4/5 ADF/PF. No focal sensory findings.   Skin:  Hip incision is dressed.  Appropriately tender    Results for orders placed or performed during the hospital encounter of 04/29/18 (from the past 48 hour(s))  CBC     Status: Abnormal   Collection Time: 04/30/18  5:53 AM  Result Value Ref Range   WBC 10.3 4.0 - 10.5 K/uL   RBC 3.31 (L) 3.87 - 5.11 MIL/uL   Hemoglobin 10.0 (L)  12.0 - 15.0 g/dL   HCT 31.5 (L) 36.0 - 46.0 %   MCV 95.2 78.0 - 100.0 fL   MCH 30.2 26.0 - 34.0 pg   MCHC 31.7 30.0 - 36.0 g/dL   RDW 14.5 11.5 - 15.5 %   Platelets 207 150 - 400 K/uL    Comment: Performed at [facility] [address], [place], [place] [address]  Basic metabolic panel     Status: Abnormal   Collection Time: 04/30/18  5:53 AM  Result Value Ref Range   Sodium 140 135 - 145 mmol/L   Potassium 4.3 3.5 - 5.1 mmol/L   Chloride 107 98 - 111 mmol/L   CO2 28 22 - 32 mmol/L   Glucose, Bld 112 (H) 70 - 99 mg/dL   BUN 9 8 - 23 mg/dL   Creatinine, Ser 0.64 0.44 - 1.00 mg/dL   Calcium 8.8 (L) 8.9 - 10.3 mg/dL   GFR calc non Af Amer >60 >60 mL/min   GFR calc Af Amer >60 >60 mL/min    Comment: (NOTE) The eGFR has been calculated using the CKD EPI equation. This calculation has not been validated in all clinical situations. eGFR's persistently <60 mL/min signify possible Chronic Kidney Disease.    Anion gap  5 5 - 15    Comment: Performed at [facility] [address], [place], [place] [address]  CBC     Status: Abnormal   Collection Time: 05/01/18  6:54 AM  Result Value Ref Range   WBC 13.3 (H) 4.0 - 10.5 K/uL   RBC 3.23 (L) 3.87 - 5.11 MIL/uL   Hemoglobin 9.9 (L) 12.0 - 15.0 g/dL   HCT 31.0 (L) 36.0 - 46.0 %   MCV 96.0 78.0 - 100.0 fL   MCH 30.7 26.0 - 34.0 pg   MCHC 31.9 30.0 - 36.0 g/dL   RDW 14.7 11.5 - 15.5 %   Platelets 206 150 - 400 K/uL    Comment: Performed at [facility], [place] [address] [place],  [address]   Dg C-arm 1-60 Min  Result Date: 04/29/2018 CLINICAL DATA:  Left hip revision FLUOROSCOPY TIME:  5 seconds. Images: 2 EXAM: OPERATIVE LEFT HIP (WITH PELVIS IF PERFORMED) to VIEWS TECHNIQUE: Fluoroscopic spot image(s) were submitted for interpretation post-operatively. COMPARISON:  None. FINDINGS: The patient is status post left hip revision. Only a portion of the acetabular component is identified but is grossly unremarkable. The superior most aspect of the femoral component is not included on today's study. No evidence of dislocation on AP imaging. Four proximal cerclage wires are identified. The distal most tip of the femoral component is not visualized. IMPRESSION: 1. Left hip revision as above. The superior and inferior most aspects of the hardware are not included on this study. Within visualized limits, no acute abnormalities. Electronically Signed   By: Dorise Bullion III M.D   On: 04/29/2018 15:58   Dg Hip Unilat With Pelvis 1v Left  Result Date: 04/29/2018 CLINICAL DATA:  Postop EXAM: DG HIP (WITH OR WITHOUT PELVIS) 1V*L* COMPARISON:  04/29/2018 FINDINGS: Prior right hip replacement with intact hardware and normal alignment. Interval revision of left hip replacement. Two supra-acetabular fixating screws. Cerclage wires at the proximal femur cortical bone defect with adjacent radiopaque material outer aspect of the femoral stem. Gas in the soft tissues  consistent with recent surgery. IMPRESSION: 1. Interval revision of left hip replacement with normal alignment. Focal cortical defect proximal shaft of the femur  on the outer side with adjacent radiopaque material. 2. Prior right hip replacement with normal alignment. Electronically Signed   By: Donavan Foil M.D.   On: 04/29/2018 17:40   Dg Hip Operative Unilat W Or W/o Pelvis Left  Result Date: 04/29/2018 CLINICAL DATA:  Left hip revision FLUOROSCOPY TIME:  5 seconds. Images: 2 EXAM: OPERATIVE LEFT HIP (WITH PELVIS IF PERFORMED) to VIEWS TECHNIQUE: Fluoroscopic spot image(s) were submitted for interpretation post-operatively. COMPARISON:  None. FINDINGS: The patient is status post left hip revision. Only a portion of the acetabular component is identified but is grossly unremarkable. The superior most aspect of the femoral component is not included on today's study. No evidence of dislocation on AP imaging. Four proximal cerclage wires are identified. The distal most tip of the femoral component is not visualized. IMPRESSION: 1. Left hip revision as above. The superior and inferior most aspects of the hardware are not included on this study. Within visualized limits, no acute abnormalities. Electronically Signed   By: Dorise Bullion III M.D   On: 04/29/2018 15:58       Medical Problem List and Plan: 1.  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  Touchdown weightbearing as well as history of right total hip arthroplasty  -admit to inpatient rehab 2.  DVT Prophylaxis/Anticoagulation: SCDs.   -recent Venous Doppler studies negative 3. Pain Management: Oxycodone as needed  -ice to left hip  -consider scheduled pain medication prior to therapies if pain is prohibitive.  4. Mood: Provide emotional support 5. Neuropsych: This patient is capable of  making decisions on her own behalf. 6. Skin/Wound Care: Routine skin checks  -maintain current dressing to left hip 7. Fluids/Electrolytes/Nutrition: Routine in and outs with follow-up chemistries ordered.  8.  Acute blood loss anemia.  Follow-up CBC   -no active signs of blood loss 9.  Hypothyroidism.  Synthroid 10.  Hyperlipidemia.  Zocor 11.  Constipation.    scheduled soft/laxative 12.  GERD.  Protonix 13.  Prediabetes.  Hemoglobin A1c 5.8 14. Leukocytosis: WBC's 13.3 today  -reactive?  -monitor temperature curve, wounds   -consider UA/UCX if symptomatic    Post Admission Physician Evaluation: 1. Functional deficits secondary  to metallosis left hip s/p complicated revision. 2. Patient is admitted to receive collaborative, interdisciplinary care between the physiatrist, rehab nursing staff, and therapy team. 3. Patient's level of medical complexity and substantial therapy needs in context of that medical necessity cannot be provided at a lesser intensity of care such as a SNF. 4. Patient has experienced substantial functional loss from his/her baseline which was documented above under the "Functional History" and "Functional Status" headings.  Judging by the patient's diagnosis, physical exam, and functional history, the patient has potential for functional progress which will result in measurable gains while on inpatient rehab.  These gains will be of substantial and practical use upon discharge  in facilitating mobility and self-care at the household level. 5. Physiatrist will provide 24 hour management of medical needs as well as oversight of the therapy plan/treatment and provide guidance as appropriate regarding the interaction of the two. 6. The Preadmission Screening has been reviewed and patient status is unchanged unless otherwise stated above. 7. 24 hour rehab nursing will assist with bladder management, bowel management, safety, skin/wound care, disease management, medication  administration, pain management and patient education  and help integrate therapy concepts, techniques,education, etc. 8. PT  will assess and treat for/with: .Lower extremity strength, range of motion, stamina, balance, functional mobility, safety, adaptive techniques and equipment, pain mgt, ortho precautions,  wound care, community reentry.   Goals are: mod I. 9. OT will assess and treat for/with: ADL's, functional mobility, safety, upper extremity strength, adaptive techniques and equipment, pain mgt, ortho precautions .   Goals are: mod I. Therapy may proceed with showering this patient. 10. SLP will assess and treat for/with: n/a.  Goals are: n/a. 11. Case Management and Social Worker will assess and treat for psychological issues and discharge planning. 12. Team conference will be held weekly to assess progress toward goals and to determine barriers to discharge. 13. Patient will receive at least 3 hours of therapy per day at least 5 days per week. 14. ELOS: 7-10 days       15. Prognosis:  excellent   I have personally performed a face to face diagnostic evaluation of this patient and formulated the key components of the plan.  Additionally, I have personally reviewed laboratory data, imaging studies, as well as relevant notes and concur with the physician assistant's documentation above.  Meredith Staggers, MD, [facility]    Lavon Paganini [place], PA-C 05/01/2018

## 2018-05-01 NOTE — Progress Notes (Signed)
Subjective: Patient doing well this morning.  She is complaining of left lower leg pain.  Left hip pain fairly controlled.  PT OT recommending CIR.  Patient was seen by rehab admissions coordinator yesterday and I reviewed their note.  RN stated that they needed clarification as to whether or not this is an orthopedic bundle despite PT OT recommendations.     Objective: Vital signs in last 24 hours: Temp:  [98 F (36.7 C)-98.9 F (37.2 C)] 98 F (36.7 C) (10/04 0600) Pulse Rate:  [84-104] 85 (10/04 0600) Resp:  [16] 16 (10/03 2225) BP: (102-114)/(45-66) 111/66 (10/04 0600) SpO2:  [88 %-97 %] 97 % (10/04 0600)  Intake/Output from previous day: 10/03 0701 - 10/04 0700 In: 1080 [P.O.:1080] Out: 450 [Urine:450] Intake/Output this shift: No intake/output data recorded.  Recent Labs    04/30/18 0553 05/01/18 0654  HGB 10.0* 9.9*   Recent Labs    04/30/18 0553 05/01/18 0654  WBC 10.3 13.3*  RBC 3.31* 3.23*  HCT 31.5* 31.0*  PLT 207 206   Recent Labs    04/29/18 1025 04/30/18 0553  NA 139 140  K 4.0 4.3  CL 106 107  CO2 22 28  BUN 15 9  CREATININE 0.72 0.64  GLUCOSE 87 112*  CALCIUM 10.0 8.8*   No results for input(s): LABPT, INR in the last 72 hours.  Exam Patient alert and oriented.  No respiratory distress.  Left hip wound looks good.  Staples intact.  No drainage or signs of infection.  Left calf there is swelling.  Calf is markedly tender to palpation.  Neurovascular intact.  No focal motor deficits.     Assessment/Plan: With patient's calf pain I will get stat venous Doppler this morning to rule out DVT.  Will get social Development worker, community to look into the orthopedic bundle issue.  Advised patient that with PT OT recommending CIR that she would not be a safe discharge home with home health PT.  Patient did have a total hip revision of all components which required osteotomy of the femur and fixation of that with cables.  Currently with touchdown weightbearing  restrictions but it appears that it is difficult for her to do that safely.   Benjiman Core 05/01/2018, 9:29 AM

## 2018-05-01 NOTE — Progress Notes (Signed)
PMR Admission Coordinator Pre-Admission Assessment  Patient: Emma Lewis is an 67 y.o., female MRN: [id number] DOB: [birth year] Height: 5\' 5"  (165.1 cm) Weight: 86.3 kg                                                                                                                                                  Insurance Information HMO:     PPO:      PCP:      IPA:      80/20:      OTHER:  PRIMARY: Medicare A & B      Policy#: [id number]      Subscriber: Self Employer: Retired  Benefits:  Phone #: Verified online     Name: [employer]  Eff. Date: A:03/29/12 B:03/29/14      Deduct: $1364      Out of Pocket Max: N/A      Life Max: N/A CIR: 100%      SNF: 100% days 1-20; 80% days 21-100 Outpatient: 80%     Co-Pay: 20% Home Health: 100%      Co-Pay: $0 DME: 80%     Co-Pay: 20% Providers: Patient's Choice   SECONDARY: [place]       Policy#: [id number]      Subscriber: Self Employer: Retired  Benefits:  Phone #: [phone]       Emergency [place]    Name Relation Home Work Mobile   Bower,Isaac Spouse [phone]  [phone]   Bernice, Mcauliffe [phone]  [phone]     Current Medical History  Patient Admitting Diagnosis: Left total hip arthoplasty metalosis s/p left total hip arthroplasty, posterior approach with osteotomy of femur, cable fixation. Pt with left leg length discrepancy, right hip arthroplasty/pain related to this as well.  History of Present Illness: Emma Lewis is a 67 year old right-handed female history of prediabetes, right total hip arthroplasty 01/21/2018 as well as a left total hip replacement 2011 with revision 2017 per Dr. Lyla Glassing. Per chart review lives with spouse independent with assistive device prior to admission. One level home with 3 steps to entry. Presented 04/29/2018 with increasing left hip pain. No change with conservative care. X-rays show calcar resorption loosening around the  acetabular shell. Underwent left total hip arthroplasty revisionposterior approachof all components acetabular and femur 04/29/2018 per Dr. Lorin Mercy. Hospital course pain management. Touchdown weightbearing left lower extremitywith posterior hip precautions. Placed on aspirin for DVT prophylaxis. Venous Doppler studies negative. Acute blood loss anemia 9.9. Therapy evaluations completed with recommendations of physical medicine rehab consult. Patient was admitted for a comprehensive rehab program 05/01/18.  Past Medical History      Past Medical History:  Diagnosis Date  . Arthritis    "all over my body" (04/29/2018)  . Chronic lower back pain   .  Fatty liver   . Fever blister    Takes Acyclovir  . GERD (gastroesophageal reflux disease)   . Hyperlipemia   . Hypothyroidism   . Migraines   . Osteopenia   . Pneumonia    "once; years ago" (04/29/2018)  . PONV (postoperative nausea and vomiting)   . Pre-diabetes   . Seasonal allergies   . Vertigo     Family History  family history includes Lymphoma in her mother.  Prior Rehab/Hospitalizations:  Has the patient had major surgery during 100 days prior to admission? Yes  Current Medications   Current Facility-Administered Medications:  .  0.9 %  sodium chloride infusion, , Intravenous, Continuous, Lanae Crumbly, PA-C, Last Rate: 85 mL/hr at 04/30/18 1845 .  acetaminophen (TYLENOL) tablet 325-650 mg, 325-650 mg, Oral, Q6H PRN, Lanae Crumbly, PA-C .  aspirin EC tablet 325 mg, 325 mg, Oral, Q breakfast, Benjiman Core M, PA-C, 325 mg at 05/01/18 1001 .  cholecalciferol (VITAMIN D) tablet 2,000 Units, 2,000 Units, Oral, BID, Lanae Crumbly, PA-C, 2,000 Units at 05/01/18 1000 .  docusate sodium (COLACE) capsule 100 mg, 100 mg, Oral, BID, Benjiman Core M, PA-C, 100 mg at 05/01/18 1000 .  lactated ringers infusion, , Intravenous, Continuous, Effie Berkshire, MD, Last Rate: 50 mL/hr at 04/29/18 1035 .  levothyroxine  (SYNTHROID, LEVOTHROID) tablet 100 mcg, 100 mcg, Oral, QAC breakfast, Lanae Crumbly, PA-C, 100 mcg at 05/01/18 0601 .  meclizine (ANTIVERT) tablet 25 mg, 25 mg, Oral, q morning - 10a, Lanae Crumbly, PA-C, 25 mg at 05/01/18 1000 .  menthol-cetylpyridinium (CEPACOL) lozenge 3 mg, 1 lozenge, Oral, PRN **OR** phenol (CHLORASEPTIC) mouth spray 1 spray, 1 spray, Mouth/Throat, PRN, Benjiman Core M, PA-C .  metoCLOPramide (REGLAN) tablet 5-10 mg, 5-10 mg, Oral, Q8H PRN **OR** metoCLOPramide (REGLAN) injection 5-10 mg, 5-10 mg, Intravenous, Q8H PRN, Benjiman Core M, PA-C .  ondansetron [facility]) tablet 4 mg, 4 mg, Oral, Q6H PRN **OR** ondansetron (ZOFRAN) injection 4 mg, 4 mg, Intravenous, Q6H PRN, Benjiman Core M, PA-C .  oxyCODONE (Oxy IR/ROXICODONE) immediate release tablet 5-10 mg, 5-10 mg, Oral, Q6H PRN, Lanae Crumbly, PA-C, 10 mg at 05/01/18 0601 .  pantoprazole (PROTONIX) EC tablet 40 mg, 40 mg, Oral, Daily, Benjiman Core M, PA-C, 40 mg at 05/01/18 1000 .  polyethylene glycol (MIRALAX / GLYCOLAX) packet 17 g, 17 g, Oral, Daily PRN, Benjiman Core M, PA-C .  polyvinyl alcohol (LIQUIFILM TEARS) 1.4 % ophthalmic solution 1 drop, 1 drop, Both Eyes, PRN, [name], MD .  simvastatin (ZOCOR) tablet 40 mg, 40 mg, Oral, QHS, Benjiman Core M, PA-C, 40 mg at 04/30/18 2302  Patients Current Diet:     Diet Order                  Diet regular Room service appropriate? Yes; Fluid consistency: Thin  Diet effective now               Precautions / Restrictions Precautions Precautions: Fall, Posterior Hip Precaution Booklet Issued: Yes (comment) Precaution Comments: Reviewed posterior hip precautions with patient.   Restrictions Weight Bearing Restrictions: Yes LLE Weight Bearing: Touchdown weight bearing   Has the patient had 2 or more falls or a fall with injury in the past year?No  Prior Activity Level Community (5-7x/wk): Prior to admission patient was independent with rolling walker  except for some assist with socks and shoes.  She was driving and enjoyes her dog Gracie.    Home  Assistive Devices / Equipment Home Assistive Devices/Equipment: Radio producer (specify quad or straight), Built-in shower seat, Eyeglasses Home Equipment: Walker - 2 wheels, Cane - single point, Bedside commode, Crutches, Shower seat - built in  Prior Device Use: Indicate devices/aids used by the patient prior to current illness, exacerbation or injury? Walker  Prior Functional Level Prior Function Level of Independence: Independent with assistive device(s) Comments: using RW, husband assisting with some LB dressing (socks)  Self Care: Did the patient need help bathing, dressing, using the toilet or eating? Needed some help with donning socks and shoes   Indoor Mobility: Did the patient need assistance with walking from room to room (with or without device)? Independent  Stairs: Did the patient need assistance with internal or external stairs (with or without device)? Independent  Functional Cognition: Did the patient need help planning regular tasks such as shopping or remembering to take medications? Independent  Current Functional Level Cognition  Overall Cognitive Status: Within Functional Limits for tasks assessed Orientation Level: Oriented X4    Extremity Assessment (includes Sensation/Coordination)  Upper Extremity Assessment: Generalized weakness, RUE deficits/detail RUE Deficits / Details: reports 3 shoulder surgeries on R side  Lower Extremity Assessment: Defer to PT evaluation LLE Deficits / Details: Decreased strength and AROM consistent with above mentioned surgery.  LLE: Unable to fully assess due to pain, Unable to fully assess due to immobilization    ADLs  Overall ADL's : Needs assistance/impaired Eating/Feeding: Set up, Sitting Grooming: Set up, Sitting Upper Body Bathing: Set up, Sitting Lower Body Bathing: Total assistance, Sit to/from stand, Sitting/lateral  leans, Adhering to hip precautions, Cueing for compensatory techniques, With adaptive equipment Upper Body Dressing : Set up, Sitting Lower Body Dressing: Total assistance, Sit to/from stand, Adhering to hip precautions, Sitting/lateral leans, Cueing for compensatory techniques, With adaptive equipment Toilet Transfer: Total assistance, BSC, Adhering to hip precautions, RW Toileting- Clothing Manipulation and Hygiene: Moderate assistance, Sit to/from stand, Adhering to hip precautions Tub/ Shower Transfer: Total assistance, Adhering to hip precautions, Shower seat, Rolling walker Functional mobility during ADLs: Maximal assistance, Total assistance, Rolling walker General ADL Comments: Pt currently limited by pain to safely complete functional mobility during ADLs. Pt unable to recall hip precautions despite visual cue and reminders.     Mobility  Overal bed mobility: Needs Assistance Bed Mobility: Supine to Sit Supine to sit: Mod assist, +2 for safety/equipment General bed mobility comments: Pt required assistance to advance B LEs to edge of bed and elevate trunk into sitting.  Completed transfer via helicopter style.  Pt able to scoot forward to edge of bed with minimal assistance once seated more upright.      Transfers  Overall transfer level: Needs assistance Equipment used: Rolling walker (2 wheeled) Transfers: Sit to/from Stand, [employer] Transfers Sit to Stand: Mod assist, +2 safety/equipment Stand pivot transfers: Mod assist, +2 physical assistance General transfer comment: Pt clearly not able to maintain during transition into standing.  With cueing and assistance she is able to maintain weight bearing while in standing and during pivot to [facility] from bed.  Pt required assistance to advance LLE forward during descent and max VCs for hand placement.  Once finished toileting she was more fatigued and required max +2 to stand and had to have the bed side commode removed from behind her  and the recliner put in it's placed.      Ambulation / Gait / Stairs / Wheelchair Mobility  Ambulation/Gait Ambulation/Gait assistance: (Pt remains unable due to fatigue and  weakness.  ) General Gait Details: Unable at this time.     Posture / Balance Dynamic Sitting Balance Sitting balance - Comments: not formally tested this date, up in recliner with significant support Balance Overall balance assessment: Needs assistance Sitting-balance support: Feet supported, Bilateral upper extremity supported Sitting balance-Leahy Scale: Poor Sitting balance - Comments: not formally tested this date, up in recliner with significant support Standing balance support: Bilateral upper extremity supported, During functional activity Standing balance-Leahy Scale: Poor Standing balance comment: Max assist required.     Special needs/care consideration BiPAP/CPAP: No CPM: No Continuous Drip IV: No Dialysis: No         Life Vest: No Oxygen: No Special Bed: No Trach Size: No Wound Vac (area): No       Skin: Dry and Monitor surgical incision                               Bowel mgmt: Continent, last BM 04/29/18 Bladder mgmt: Continent with some more urgency  Diabetic mgmt: No     Previous Home Environment Living Arrangements: Spouse/significant other Available Help at Discharge: Family, Available 24 hours/day Type of Home: House Home Layout: One level Home Access: Stairs to enter Entrance Stairs-Rails: Left Entrance Stairs-Number of Steps: 3 in front, 8 in back Bathroom Shower/Tub: Multimedia programmer: Associate Professor Accessibility: Yes Home Care Services: No  Discharge Living Setting Plans for Discharge Living Setting: Patient's home, Lives with (comment)(Spouse and dog) Type of Home at Discharge: House Discharge Home Layout: One level Discharge Home Access: Stairs to enter Entrance Stairs-Rails: Left Entrance Stairs-Number of Steps: 3 Discharge Bathroom  Shower/Tub: Walk-in shower, Door(built in bench ) Discharge Bathroom Toilet: Handicapped height Discharge Bathroom Accessibility: Yes How Accessible: Accessible via walker Does the patient have any problems obtaining your medications?: No  Social/Family/Support Systems Patient Roles: Spouse Contact Information: Spouse: Chief Financial Officer Anticipated Caregiver: Spouse Anticipated Caregiver's Contact Information: see above Ability/Limitations of Caregiver: Works part time  Careers adviser: Intermittent Discharge Plan Discussed with Primary Caregiver: (Discussed with patient ) Is Caregiver In Agreement with Plan?: (Patient in agreement with plan ) Does Caregiver/Family have Issues with Lodging/Transportation while Pt is in Rehab?: No  Goals/Additional Needs Patient/Family Goal for Rehab: PT/OT: Mod I  Expected length of stay: 7-11 days  Cultural Considerations: [facility]  Dietary Needs: Regular textures and thin liquids  Equipment Needs: TBD Pt/Family Agrees to Admission and willing to participate: Yes Program Orientation Provided & Reviewed with Pt/Caregiver Including Roles  & Responsibilities: Yes  Barriers to Discharge: Decreased caregiver support(Mod I goals as spouse works part-time )  Decrease burden of Care through [employer] rehab admission: No  Possible need for SNF placement upon discharge: No  Patient Condition: This patient's condition remains as documented in the consult dated 05/01/18, in which the Rehabilitation Physician determined and documented that the patient's condition is appropriate for intensive rehabilitative care in an inpatient rehabilitation facility. Will admit to inpatient rehab today.  Preadmission Screen Completed By:  Gunnar Fusi, 05/01/2018 2:00 PM ______________________________________________________________________   Discussed status with Dr. Naaman Plummer on 05/01/18 at 1400 and received telephone approval for admission today.  Admission Coordinator:  Gunnar Fusi, time 1400/Date 05/01/18           Cosigned by: Meredith Staggers, MD at 05/01/2018 2:07 PM  Revision History

## 2018-05-01 NOTE — Progress Notes (Signed)
Received the pt. As a new admit.Pt. And her husband were oriented to the unit protocol.Safety plan was explained.Fall prevention plan was explained and signed.

## 2018-05-01 NOTE — Plan of Care (Signed)

## 2018-05-01 NOTE — Progress Notes (Addendum)
LLE venous duplex prelim: negative for DVT. Bryson Gavia Eunice, RDMS, RVT  

## 2018-05-01 NOTE — Telephone Encounter (Signed)
Dr. Yates called and spoke with patient. 

## 2018-05-01 NOTE — Progress Notes (Signed)
Patient transferring to 4MW room 12. Report given to receiving RN. Patient med x1 with prn pain med. Took all her personal belongings.

## 2018-05-01 NOTE — Progress Notes (Addendum)
Physical Therapy Treatment Patient Details Name: Emma Lewis MRN: [id number] DOB: Dec 12, 1950 Today's Date: 05/01/2018    History of Present Illness Pt is a 67 y/o female who presents s/p L total hip replacement revision. Pt has posterior hip precautions and is TDWB status on the LLE.     PT Comments    Pt remains painful with mobility during session.  She continues to want to return home despite recommendation for rehab in a post acute setting.  Pt is slow and guarded with mobility and required mod to max assistance of 1 to 2 persons at any given time.  Re-educated patient pre and post treatment on posterior hip precautions.  Pt tolerated supine LLE exercises but with grimacing and guarding.  If patient continues to refuse rehab placement she will require HHPT, [place] and [place] aide.  She reports she has a husband who can assist her at home.  She has RW and BSC at home from previous hospitalization.   Follow Up Recommendations  CIR;Supervision/Assistance - 24 hour     Equipment Recommendations  None recommended by PT    Recommendations for Other Services Rehab consult     Precautions / Restrictions Precautions Precautions: Fall;Posterior Hip Precaution Booklet Issued: Yes (comment) Precaution Comments: Reviewed posterior hip precautions with patient.   Required Braces or Orthoses: Knee Immobilizer - Left(removed for OOB activities and therapeutic exercises.  ) Restrictions Weight Bearing Restrictions: Yes LLE Weight Bearing: Touchdown weight bearing    Mobility  Bed Mobility Overal bed mobility: Needs Assistance Bed Mobility: Supine to Sit     Supine to sit: Mod assist;+2 for safety/equipment     General bed mobility comments: Pt required assistance to advance B LEs to edge of bed and elevate trunk into sitting.  Completed transfer via helicopter style.  Pt able to scoot forward to edge of bed with minimal assistance once seated more upright.    Transfers Overall transfer  level: Needs assistance Equipment used: Rolling walker (2 wheeled) Transfers: Sit to/from [facility] Sit to Stand: Mod assist;+2 safety/equipment Stand pivot transfers: Mod assist;+2 physical assistance       General transfer comment: Pt clearly not able to maintain during transition into standing.  With cueing and assistance she is able to maintain weight bearing while in standing and during pivot to [facility] from bed.  Pt required assistance to advance LLE forward during descent and max VCs for hand placement.  Once finished toileting she was more fatigued and required max +2 to stand and had to have the bed side commode removed from behind her and the recliner put in it's placed.    Ambulation/Gait Ambulation/Gait assistance: (Pt remains unable due to fatigue and weakness.  )               Stairs             Wheelchair Mobility    Modified Rankin (Stroke Patients Only)       Balance Overall balance assessment: Needs assistance Sitting-balance support: Feet supported;Bilateral upper extremity supported Sitting balance-Leahy Scale: Poor Sitting balance - Comments: not formally tested this date, up in recliner with significant support     Standing balance-Leahy Scale: Poor                              Cognition Arousal/Alertness: Awake/alert Behavior During Therapy: WFL for tasks assessed/performed Overall Cognitive Status: Within Functional Limits for tasks assessed  Exercises Total Joint Exercises Ankle Circles/Pumps: AROM;Both;10 reps;Supine Quad Sets: AROM;Left;10 reps;Supine Heel Slides: AAROM;Left;10 reps;Supine Long Arc Quad: Left;AAROM;10 reps;Supine    General Comments        Pertinent Vitals/Pain Pain Assessment: 0-10 Pain Score: 7  Pain Location: L hip Pain Descriptors / Indicators: Operative site guarding;Discomfort Pain Intervention(s): Monitored during  session;Repositioned;Ice applied    Home Living                      Prior Function            PT Goals (current goals can now be found in the care plan section) Acute Rehab PT Goals Patient Stated Goal: return home, get stronger Potential to Achieve Goals: Good Progress towards PT goals: Progressing toward goals    Frequency    Min 6X/week      PT Plan Current plan remains appropriate    Co-evaluation              AM-PAC PT "6 Clicks" Daily Activity  Outcome Measure  Difficulty turning over in bed (including adjusting bedclothes, sheets and blankets)?: Unable Difficulty moving from lying on back to sitting on the side of the bed? : Unable Difficulty sitting down on and standing up from a chair with arms (e.g., wheelchair, bedside commode, etc,.)?: Unable Help needed moving to and from a bed to chair (including a wheelchair)?: A Lot Help needed walking in hospital room?: Total Help needed climbing 3-5 steps with a railing? : Total 6 Click Score: 7    End of Session Equipment Utilized During Treatment: Gait belt Activity Tolerance: Patient limited by pain(and weakness.  ) Patient left: in chair;with call bell/phone within reach Nurse Communication: Mobility status;Need for lift equipment(maxi sky lift pad in place for back to bed.  ) PT Visit Diagnosis: Unsteadiness on feet (R26.81);Pain;Difficulty in walking, not elsewhere classified (R26.2) Pain - Right/Left: Left Pain - part of body: Hip     Time: 4540-9811 PT Time Calculation (min) (ACUTE ONLY): 25 min  Charges:  $Therapeutic Exercise: 8-22 mins $Therapeutic Activity: 8-22 mins                     Governor Rooks, PTA Acute Rehabilitation Services Pager [phone] Office [phone]     Elantra Caprara Eli Hose 05/01/2018, 1:09 PM

## 2018-05-01 NOTE — Progress Notes (Signed)
CSW consulted with Dr. Ricard Dillon regarding patient's refusal for SNF and Dr. encouraging CIR due to severity of extensive surgery procedure performed on patient.   CSW spoke with Pamala Hurry with CIR, and relayed information to Dr. Ricard Dillon regarding putting in orders for inpatient rehab to move forward with CIR recommendation.    [place], [place]

## 2018-05-01 NOTE — Progress Notes (Signed)
Physical Medicine and Rehabilitation Consult Reason for Consult: Decreased functional mobility Referring Physician: Dr. Lorin Mercy   HPI: Emma Lewis is a 67 y.o. right-handed female with history of right total hip arthroplasty 01/21/2018 as well as left total hip replacement 2011 with revision 2017.Per chart review lives with spouse and independent with assistive device.One level home wit 3 steps to enter. Presented 04/29/2018 with increasing left hip pain.  No change with conservative care.  X-rays show calcar resorption loosening around the acetabular shell.  Underwent left total hip arthroplasty revision of all components acetabular and femur 04/29/2018 per Dr. Inda Merlin.  Hospital course pain management.  Touchdown weightbearing left lower extremity.Venous duplex negative.  Acute blood loss anemia 9.9.  Therapy evaluation completed.  MD has requested physical medicine rehab consult.   Review of Systems  Constitutional: Negative for chills and fever.  HENT: Negative for hearing loss.   Eyes: Negative for blurred vision and double vision.  Respiratory: Negative for cough and shortness of breath.   Cardiovascular: Negative for chest pain, palpitations and leg swelling.  Gastrointestinal: Positive for constipation. Negative for nausea and vomiting.       GERD  Genitourinary: Negative for dysuria, flank pain and hematuria.  Musculoskeletal: Positive for back pain and myalgias.  Skin: Negative for rash.  Neurological: Positive for dizziness and headaches.  All other systems reviewed and are negative.      Past Medical History:  Diagnosis Date  . Arthritis    "all over my body" (04/29/2018)  . Chronic lower back pain   . Fatty liver   . Fever blister    Takes Acyclovir  . GERD (gastroesophageal reflux disease)   . Hyperlipemia   . Hypothyroidism   . Migraines   . Osteopenia   . Pneumonia    "once; years ago" (04/29/2018)  . PONV (postoperative nausea and vomiting)   .  Pre-diabetes   . Seasonal allergies   . Vertigo         Past Surgical History:  Procedure Laterality Date  . ANTERIOR CERVICAL DECOMP/DISCECTOMY FUSION N/A 02/09/2014   Procedure: ANTERIOR CERVICAL DECOMPRESSION/DISCECTOMY FUSION 2 LEVELS;  Surgeon: [name], MD;  Location: [facility];  Service: Orthopedics;  Laterality: N/A;  C4-5, C5-6 Anterior Cervical Discectomy and Fusion, Allograft, Plate  . BACK SURGERY    . CARPAL TUNNEL RELEASE Right 2018  . CATARACT EXTRACTION W/ INTRAOCULAR LENS  IMPLANT, BILATERAL    . COLONOSCOPY    . FIXATION KYPHOPLASTY LUMBAR SPINE  X 2  . HIP ARTHROPLASTY Left 2011  . JOINT REPLACEMENT    . KNEE ARTHROSCOPY Right   . PATELLA FRACTURE SURGERY Right ~ 1990   "put metal in"  . PATELLA HARDWARE REMOVAL Right    "took the metal out"  . REVISION TOTAL HIP ARTHROPLASTY Left 04/29/2018  . ROBOTIC ASSISTED BILATERAL SALPINGO OOPHERECTOMY Bilateral 08/01/2015   Procedure: ROBOTIC ASSISTED BILATERAL SALPINGO OOPHORECTOMY;  Surgeon: Everitt Amber, MD;  Location: [facility];  Service: Gynecology;  Laterality: Bilateral;  . SHOULDER OPEN ROTATOR CUFF REPAIR Right    3 TOTAL  . SHOULDER SURGERY Right   . TOTAL HIP ARTHROPLASTY Right 01/21/2018   Procedure: RIGHT TOTAL HIP ARTHROPLASTY DIRECT ANTERIOR;  Surgeon: [name], MD;  Location: [facility];  Service: Orthopedics;  Laterality: Right;  . TOTAL HIP REVISION Left 06/10/2016   Procedure: TOTAL HIP REVISION ARTHROPLASTY;  Surgeon: Rod Can, MD;  Location: [facility];  Service: Orthopedics;  Laterality: Left;  . TOTAL HIP REVISION Left  04/29/2018   Procedure: left total hip arthroplasty revision posterior approach;  Surgeon: [name], MD;  Location: [facility];  Service: Orthopedics;  Laterality: Left;  . WRIST GANGLION EXCISION Left 1970s        Family History  Problem Relation Age of Onset  . Lymphoma Mother    Social History:  reports that she has never smoked. She has never used smokeless  tobacco. She reports that she drinks alcohol. She reports that she does not use drugs. Allergies:       Allergies  Allergen Reactions  . Sulfa Antibiotics Other (See Comments)    UNSPECIFIED REACTION > Childhood allergy Mother said "I liked to have died"  . Fenofibrate Rash    She didn't feel right         Medications Prior to Admission  Medication Sig Dispense Refill  . acyclovir (ZOVIRAX) 400 MG tablet Take 400 mg by mouth daily.    . Calcium Carb-Cholecalciferol (CALCIUM 600 + D PO) Take 1 tablet by mouth 3 (three) times daily.     . Carboxymethylcellulose Sodium (THERATEARS) 0.25 % SOLN Place 1 drop into both eyes 2 (two) times daily as needed (dry eyes).    . Cholecalciferol (VITAMIN D3) 2000 units TABS Take 2,000 Units by mouth 2 (two) times daily.     Marland Kitchen ibuprofen (ADVIL,MOTRIN) 200 MG tablet Take 800 mg by mouth every 6 (six) hours as needed (pain).    Marland Kitchen levothyroxine (SYNTHROID, LEVOTHROID) 100 MCG tablet Take 100 mcg by mouth daily before breakfast.     . meclizine (ANTIVERT) 25 MG tablet Take 1 tablet (25 mg total) by mouth 3 (three) times daily as needed for dizziness. Vertigo. Also available OTC (Patient taking differently: Take 25 mg by mouth every morning. Vertigo. Also available OT) 90 tablet 3  . Multiple Vitamins-Minerals (EMERGEN-C VITAMIN C) PACK Take 1 Package by mouth daily.    . mupirocin ointment (BACTROBAN) 2 % Use as directed. (Patient taking differently: Place 1 application into the nose 2 (two) times daily. Use as directed.) 22 g 0  . Omega-3 Fatty Acids (OMEGA-3 FISH OIL PO) Take 1 capsule by mouth 3 (three) times daily. 1,040mg  each     . omeprazole (PRILOSEC) 20 MG capsule Take 10 mg by mouth daily.     . phentermine 37.5 MG capsule Take 37.5 mg by mouth every morning.  0  . simvastatin (ZOCOR) 40 MG tablet Take 40 mg by mouth at bedtime.     Marland Kitchen Specialty Vitamins Products (BIOTIN PLUS KERATIN) 10000-100 MCG-MG TABS Take 1 tablet by  mouth 3 (three) times daily.     . TURMERIC CURCUMIN PO Take 1 capsule by mouth 2 (two) times daily. 500mg  each    . alendronate (FOSAMAX) 70 MG tablet Take 70 mg by mouth every Sunday.  1  . clobetasol cream (TEMOVATE) 4.78 % Apply 1 application topically 2 (two) times daily. (Patient taking differently: Apply 1 application topically daily as needed (irritation). ) 30 g 1  . nystatin-triamcinolone (MYCOLOG II) cream Apply 1 application topically 2 (two) times daily as needed (irritation).   3    Home: Home Living Family/patient expects to be discharged to:: Private residence Living Arrangements: Spouse/significant other Available Help at Discharge: Family, Available 24 hours/day Type of Home: House Home Access: Stairs to enter [employer] of Steps: 3 in front, 8 in back Entrance Stairs-Rails: Left [place]: One level Bathroom Shower/Tub: Multimedia programmer: [place] Accessibility: Yes Home Equipment: Environmental consultant -  2 wheels, Cane - single point, Bedside commode, Crutches, Shower seat - built in  Functional History: Prior Function Level of Independence: Independent with assistive device(s) Comments: using RW, husband assisting with some LB dressing (socks) Functional Status:  Mobility: Bed Mobility Overal bed mobility: Needs Assistance Bed Mobility: Supine to Sit Supine to sit: Mod assist, HOB elevated General bed mobility comments: VC's for sequencing and use of bed rails for support. Pt required mod assist for LLE advancement towards EOB as well as trunk stability with transition to EOB.  Transfers Overall transfer level: Needs assistance Equipment used: Rolling walker (2 wheeled) Transfers: Sit to/from Stand, [employer] Transfers Sit to Stand: Mod assist Stand pivot transfers: Max assist General transfer comment: Pt up in chair upon arrival, transferred not tested this date due to meal arrival Ambulation/Gait General Gait Details:  Unable at this time.   ADL: ADL Overall ADL's : Needs assistance/impaired Eating/Feeding: Set up, Sitting Grooming: Set up, Sitting Upper Body Bathing: Set up, Sitting Lower Body Bathing: Total assistance, Sit to/from stand, Sitting/lateral leans, Adhering to hip precautions, Cueing for compensatory techniques, With adaptive equipment Upper Body Dressing : Set up, Sitting Lower Body Dressing: Total assistance, Sit to/from stand, Adhering to hip precautions, Sitting/lateral leans, Cueing for compensatory techniques, With adaptive equipment Toilet Transfer: Total assistance, BSC, Adhering to hip precautions, RW Toileting- Clothing Manipulation and Hygiene: Moderate assistance, Sit to/from stand, Adhering to hip precautions Tub/ Shower Transfer: Total assistance, Adhering to hip precautions, Shower seat, Rolling walker Functional mobility during ADLs: Maximal assistance, Total assistance, Rolling walker General ADL Comments: Pt currently limited by pain to safely complete functional mobility during ADLs. Pt unable to recall hip precautions despite visual cue and reminders.   Cognition: Cognition Overall Cognitive Status: Within Functional Limits for tasks assessed Orientation Level: Oriented X4 Cognition Arousal/Alertness: Awake/alert Behavior During Therapy: WFL for tasks assessed/performed Overall Cognitive Status: Within Functional Limits for tasks assessed  Blood pressure 111/66, pulse 85, temperature 98 F (36.7 C), temperature source Oral, resp. rate 16, height 5\' 5"  (1.651 m), weight 86.3 kg, SpO2 97 %. Physical Exam  Vitals reviewed. Constitutional: She is oriented to person, place, and time. She appears well-developed.  HENT:  Head: Normocephalic.  Eyes: Pupils are equal, round, and reactive to light.  Neck: Normal range of motion.  Cardiovascular: Normal rate.  Respiratory: Effort normal.  GI: Soft.  Musculoskeletal:  Left hip with significant edema and pain, slightly  warm. Wound dressed with no apparent drainage through dressing. Left calf sl tender to palpation. Left leg length discrepancy of 1/4" to 1/2". Right hip sl tender with AROM/PROM  Neurological: She is alert and oriented to person, place, and time. No cranial nerve deficit.  Alert in no acute distress follows full commands. UE 5/5. LLE 1/5 HF, KE 4/5 ADF/PF. RLE: 3/5 HF, KE and 5/5 ADF/PF  Skin: Skin is warm.  Psychiatric: She has a normal mood and affect. Her behavior is normal.  Assessment/Plan: Diagnosis: Left total hip arthoplasty metalosis s/p left total hip arthroplasty, posterior approach with osteotomy of femur, cable fixation. Pt with left leg length discrepancy, right hip arthroplasty/pain related to this as well 1. Does the need for close, 24 hr/day medical supervision in concert with the patient's rehab needs make it unreasonable for this patient to be served in a less intensive setting? Yes 2. Co-Morbidities requiring supervision/potential complications: pain mgt, wound care, Acute blood loss anemia, leukoctyosis, orthotic evaluation for leg length discrepancy 3. Due to bladder management, bowel management, safety,  skin/wound care, disease management, medication administration, pain management and patient education, does the patient require 24 hr/day rehab nursing? Yes 4. Does the patient require coordinated care of a physician, rehab nurse, PT (1-2 hrs/day, 5 days/week) and OT (1-2 hrs/day, 5 days/week) to address physical and functional deficits in the context of the above medical diagnosis(es)? Yes Addressing deficits in the following areas: balance, endurance, locomotion, strength, transferring, bowel/bladder control, bathing, dressing, feeding, grooming, toileting and psychosocial support 5. Can the patient actively participate in an intensive therapy program of at least 3 hrs of therapy per day at least 5 days per week? Yes 6. The potential for patient to make measurable gains while on  inpatient rehab is excellent 7. Anticipated functional outcomes upon discharge from inpatient rehab are modified independent  with PT, modified independent with OT, n/a with SLP. 8. Estimated rehab length of stay to reach the above functional goals is: 7-11 days 9. Anticipated D/C setting: Home 10. Anticipated post D/C treatments: [place] therapy 11. Overall Rehab/Functional Prognosis: excellent  RECOMMENDATIONS: This patient's condition is appropriate for continued rehabilitative care in the following setting: CIR Patient has agreed to participate in recommended program. Yes Note that insurance prior authorization may be required for reimbursement for recommended care.  Comment: Rehab Admissions Coordinator to follow up.  Thanks,  Meredith Staggers, MD, Mellody Drown  I have personally performed a face to face diagnostic evaluation of this patient. Additionally, I have reviewed and concur with the physician assistant's documentation above.    Lavon Paganini Angiulli, PA-C 05/01/2018        Revision History                        Routing History

## 2018-05-01 NOTE — Progress Notes (Signed)
Patient ID: Emma Lewis, female   DOB: 29-Sep-1950, 67 y.o.   MRN: [id number]   I have reviewed physical medicine and rehabilitation consult note.  Providers feel that patient is indeed a candidate for CIR.  Patient has agreed to the plan as outlined by physician and PA.  Spoke with Caryl Pina CSW advised that it is okay to transfer patient to CIR when bed available.

## 2018-05-02 ENCOUNTER — Inpatient Hospital Stay (HOSPITAL_COMMUNITY): Payer: Medicare Other

## 2018-05-02 ENCOUNTER — Inpatient Hospital Stay (HOSPITAL_COMMUNITY): Payer: Medicare Other | Admitting: Physical Therapy

## 2018-05-02 DIAGNOSIS — G8918 Other acute postprocedural pain: Secondary | ICD-10-CM

## 2018-05-02 DIAGNOSIS — D62 Acute posthemorrhagic anemia: Secondary | ICD-10-CM

## 2018-05-02 DIAGNOSIS — T84011S Broken internal left hip prosthesis, sequela: Secondary | ICD-10-CM

## 2018-05-02 DIAGNOSIS — D72829 Elevated white blood cell count, unspecified: Secondary | ICD-10-CM

## 2018-05-02 NOTE — Evaluation (Signed)
Occupational Therapy Assessment and Plan  Patient Details  Name: Emma Lewis MRN: [id number] Date of Birth: 08-01-1950  OT Diagnosis: acute pain, muscle weakness (generalized) and swelling of limb Rehab Potential:   ELOS: 14-18   Today's Date: 05/02/2018 OT Individual Time: 4098-1191 OT Individual Time Calculation (min): 75 min     Problem List:  Patient Active Problem List   Diagnosis Date Noted  . Leukocytosis   . Postoperative pain   . Failure of left total hip arthroplasty ([place]) 05/01/2018  . Acute blood loss anemia   . Leukemoid reaction   . 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 Joint effusion of pelvis or thigh, left 04/22/2018  . Trochanteric bursitis, left hip 04/22/2018  . History of left hip replacement 11/12/2017  . Bilateral primary osteoarthritis of knee 11/12/2017  . GERD (gastroesophageal reflux disease) 01/17/2017  . Hypothyroidism 01/17/2017  . Vertigo 01/16/2017  . Nausea and vomiting 01/16/2017  . Essential hypertension 01/16/2017  . Fatty liver 01/16/2017  . Failed total hip arthroplasty ([place]) 06/10/2016  . Pelvic mass 06/15/2015  . Closed wedge compression fracture of fifth lumbar vertebra ([place])   . Cervical spondylosis 02/09/2014  . DDD (degenerative disc disease), lumbar 05/13/2013  . OA (osteoarthritis) 05/13/2013    Past Medical History:  Past Medical History:  Diagnosis Date  . Arthritis    "all over my body" (04/29/2018)  . Chronic lower back pain   . Fatty liver   . Fever blister    Takes Acyclovir  . GERD (gastroesophageal reflux disease)   . Hyperlipemia   . Hypothyroidism   . Migraines   . Osteopenia   . Pneumonia    "once; years ago" (04/29/2018)  . PONV (postoperative nausea and vomiting)   . Pre-diabetes   . Seasonal allergies   . Vertigo    Past Surgical History:  Past Surgical History:  Procedure Laterality Date  . ANTERIOR CERVICAL DECOMP/DISCECTOMY FUSION N/A 02/09/2014   Procedure: ANTERIOR CERVICAL DECOMPRESSION/DISCECTOMY FUSION  2 LEVELS;  Surgeon: [name], MD;  Location: [facility];  Service: Orthopedics;  Laterality: N/A;  C4-5, C5-6 Anterior Cervical Discectomy and Fusion, Allograft, Plate  . BACK SURGERY    . CARPAL TUNNEL RELEASE Right 2018  . CATARACT EXTRACTION W/ INTRAOCULAR LENS  IMPLANT, BILATERAL    . COLONOSCOPY    . FIXATION KYPHOPLASTY LUMBAR SPINE  X 2  . HIP ARTHROPLASTY Left 2011  . JOINT REPLACEMENT    . KNEE ARTHROSCOPY Right   . PATELLA FRACTURE SURGERY Right ~ 1990   "put metal in"  . PATELLA HARDWARE REMOVAL Right    "took the metal out"  . REVISION TOTAL HIP ARTHROPLASTY Left 04/29/2018  . ROBOTIC ASSISTED BILATERAL SALPINGO OOPHERECTOMY Bilateral 08/01/2015   Procedure: ROBOTIC ASSISTED BILATERAL SALPINGO OOPHORECTOMY;  Surgeon: Everitt Amber, MD;  Location: [facility];  Service: Gynecology;  Laterality: Bilateral;  . SHOULDER OPEN ROTATOR CUFF REPAIR Right    3 TOTAL  . SHOULDER SURGERY Right   . TOTAL HIP ARTHROPLASTY Right 01/21/2018   Procedure: RIGHT TOTAL HIP ARTHROPLASTY DIRECT ANTERIOR;  Surgeon: [name], MD;  Location: [facility];  Service: Orthopedics;  Laterality: Right;  . TOTAL HIP REVISION Left 06/10/2016   Procedure: TOTAL HIP REVISION ARTHROPLASTY;  Surgeon: Rod Can, MD;  Location: [facility];  Service: Orthopedics;  Laterality: Left;  . TOTAL HIP REVISION Left 04/29/2018   Procedure: left total hip arthroplasty revision posterior approach;  Surgeon: [name], MD;  Location: [facility];  Service: Orthopedics;  Laterality: Left;  . WRIST GANGLION EXCISION Left 1970s    Assessment & Plan Clinical Impression:  67 year old right-handed female history of prediabetes, right total hip arthroplasty 01/21/2018 as well as a left total hip replacement 2011 with revision 2017 per Dr. Lyla Glassing. Per chart review lives with spouse independent with assistive device prior to admission. One level home with 3 steps to entry. Presented 04/29/2018 with increasing left hip pain. No change with  conservative care. X-rays show calcar resorption loosening around the acetabular shell. Underwent left total hip arthroplasty revision posterior approach of all components acetabular and femur 04/29/2018 per Dr. Lorin Mercy. Hospital course pain management. Touchdown weightbearing left lower extremity with posterior hip precautions. Placed on aspirin for DVT prophylaxis. Venous Doppler studies negative. Acute blood loss anemia 9.9. Therapy evaluations completed with recommendations of physical medicine rehab consult. Patient was admitted for a comprehensive rehab     Patient currently requires max with basic self-care skills secondary to muscle weakness, decreased cardiorespiratoy endurance and decreased sitting balance, decreased standing balance and decreased balance strategies.  Prior to hospitalization, patient could complete BADL/Iadl with modified independent .  Patient will benefit from skilled intervention to decrease level of assist with basic self-care skills and increase independence with basic self-care skills prior to discharge home with care partner.  Anticipate patient will require intermittent supervision and follow up home health.  OT - End of Session Activity Tolerance: Tolerates 30+ min activity with multiple rests Endurance Deficit: Yes OT Assessment Rehab Potential (ACUTE ONLY): Good OT Patient demonstrates impairments in the following area(s): Balance;Endurance;Pain;Safety OT Basic ADL's Functional Problem(s): Grooming;Bathing;Dressing;Toileting OT Transfers Functional Problem(s): Toilet;Tub/Shower OT Additional Impairment(s): None OT Plan OT Intensity: Minimum of 1-2 x/day, 45 to 90 minutes OT Frequency: 5 out of 7 days OT Duration/Estimated Length of Stay: 14-18 OT Treatment/Interventions: Balance/vestibular training;Discharge planning;Pain management;Self Care/advanced ADL retraining;Therapeutic Activities;UE/LE Coordination activities;Cognitive  remediation/compensation;Functional mobility training;Disease mangement/prevention;Patient/family education;Skin care/wound managment;Therapeutic Exercise;Visual/perceptual remediation/compensation;Community reintegration;DME/adaptive equipment instruction;Neuromuscular re-education;Psychosocial support;Splinting/orthotics;UE/LE Strength taining/ROM;Wheelchair propulsion/positioning OT Self Feeding Anticipated Outcome(s): no goal OT Basic Self-Care Anticipated Outcome(s): MOD I OT Toileting Anticipated Outcome(s): MOD I  OT Bathroom Transfers Anticipated Outcome(s): MOD I OT Recommendation Patient destination: Home Follow Up Recommendations: Home health OT Equipment Recommended: 3 in 1 bedside comode;Tub/shower bench;To be determined   Skilled Therapeutic Intervention 1:1. Pt educated on role/purpose of OT, CIR, ELOS and POC. Pt completes supine>sitting with MOD A for BLE management and A for reciprocal scooting to EOB. Pt completes UB bathing with A to wash back and LB bathing with A to wash B feet and buttocks. Pt dons bra with A to fasten in back and night gown with set up. Pt dons underwear sit to stand with RW and total A (+2 A to sit to stand from elevated bed level and maintain WB precautions. Pt unable to state hip precautions and reviewed with pt. Pt reporting 10/10 HA and RN alerted. Pt supine>sitting EOB +2 A and brushes teeth in semi reclined bed. Exited session with pt seated in bed, call light in reach and exit alarm on  OT Evaluation Precautions/Restrictions  Precautions Precautions: Fall;Posterior Hip Precaution Booklet Issued: Yes (comment) Precaution Comments: Reviewed posterior hip precautions with patient.   Required Braces or Orthoses: Knee Immobilizer - Left Restrictions Weight Bearing Restrictions: Yes LLE Weight Bearing: Touchdown weight bearing General Chart Reviewed: Yes Vital Signs   Pain Pain Assessment Pain Score: 9  Pain Type: Surgical pain Home  Living/Prior Functioning Home Living Family/patient expects to be discharged to:: Private residence Living  Arrangements: Spouse/significant other Available Help at Discharge: Family, Available 24 hours/day Type of Home: House Home Access: Stairs to enter [employer] of Steps: 3 in front, 8 in back Entrance Stairs-Rails: Left Home Layout: One level Bathroom Shower/Tub: Multimedia programmer: Handicapped height Bathroom Accessibility: Yes  Lives With: Spouse IADL History Homemaking Responsibilities: Yes Meal Prep Responsibility: Secondary Laundry Responsibility: Secondary Cleaning Responsibility: Secondary Bill Paying/Finance Responsibility: Secondary Shopping Responsibility: Secondary Current License: Yes Mode of Transportation: Car Occupation: Retired Type of Occupation: post office Leisure and Hobbies: sewing  Prior Function Level of Independence: Independent with basic ADLs  Able to [place]?: Yes Driving: Yes Comments: using RW, husband assisting with some LB dressing (socks) ADL   Vision Baseline Vision/History: No visual deficits Patient Visual Report: (cant focus as well as before) Vision Assessment?: No apparent visual deficits Perception  Perception: Within Functional Limits Praxis Praxis: Intact Cognition Overall Cognitive Status: Within Functional Limits for tasks assessed(reports feeling "foggy" from medication) Arousal/Alertness: Awake/alert Orientation Level: Person;Place;Situation Person: Oriented Place: Oriented Situation: Oriented Year: 2019 Month: October Day of Week: Correct Memory: Appears intact(unable to recall hip precautions) Immediate Memory Recall: Sock;Blue;Bed Memory Recall: Sock;Blue;Bed Memory Recall Sock: Without Cue Memory Recall Blue: Without Cue Memory Recall Bed: Without Cue Sensation Sensation Light Touch: Appears Intact Additional Comments: "it hurts" Coordination Gross Motor Movements are Fluid  and Coordinated: No Fine Motor Movements are Fluid and Coordinated: No Coordination and Movement Description: UE WFL Motor  Motor Motor: Within Functional Limits Mobility  Bed Mobility Bed Mobility: Rolling Right Rolling Right: Moderate Assistance - Patient 50-74%  Trunk/Postural Assessment  Cervical Assessment Cervical Assessment: Within Functional Limits Thoracic Assessment Thoracic Assessment: Within Functional Limits Lumbar Assessment Lumbar Assessment: Within Functional Limits Postural Control Postural Control: Within Functional Limits  Balance Balance Balance Assessed: Yes Static Sitting Balance Static Sitting - Balance Support: Feet supported Static Sitting - Level of Assistance: 5: Stand by assistance Static Standing Balance Static Standing - Level of Assistance: 2: Max assist Static Standing - Comment/# of Minutes: during clothing management Extremity/Trunk Assessment RUE Assessment RUE Assessment: Within Functional Limits LUE Assessment LUE Assessment: Within Functional Limits     Refer to Care Plan for Long Term Goals  Recommendations for other services: Therapeutic Recreation  Pet therapy   Discharge Criteria: Patient will be discharged from OT if patient refuses treatment 3 consecutive times without medical reason, if treatment goals not met, if there is a change in medical status, if patient makes no progress towards goals or if patient is discharged from hospital.  The above assessment, treatment plan, treatment alternatives and goals were discussed and mutually agreed upon: by patient  Tonny Branch 05/02/2018, 9:32 AM

## 2018-05-02 NOTE — Progress Notes (Signed)
Occupational Therapy Session Note  Patient Details  Name: Emma Lewis MRN: [id number] Date of Birth: 1950-11-23  Today's Date: 05/02/2018 OT Individual Time: 1545-1650 OT Individual Time Calculation (min): 65 min    Short Term Goals: Week 1:  OT Short Term Goal 1 (Week 1): Pt will sit to stand with MAX A of 1 caregiver OT Short Term Goal 2 (Week 1): Pt will transfer with LRAD with MOD A of 1 caregiver to w/c in prep for [facility] transfer OT Short Term Goal 3 (Week 1): Pt will don footwear with min VC and  AE OT Short Term Goal 4 (Week 1): Pt will dress UB with set up  Skilled Therapeutic Interventions/Progress Updates:    1:1. Pt with pain reported in surgical site and repositioned for comfort at end of session as pt unable to receive medication. Pt supine>sitting with A for BLE management and heavy bed rail use. OT demo shoe funnel, sock aide and reacher to doff/don socks and shoes on RLE. Pt able to return demo with min VC for technique. Pt completes squat pivot transfer to R with MAX A and VC for hand placement to [facility]. Pt able to void urine on toilet and hygiene in standing. OT completes clothing management with lateral lean/w/c push up combo. Pt completes stand pivot back to w/c with MAX A with VC for maintaining WB precautions. Exited session with pt seated in w/c, call light in reach and all needs met  Therapy Documentation Precautions:  Precautions Precautions: Fall, Posterior Hip Precaution Booklet Issued: Yes (comment) Precaution Comments: Reviewed posterior hip precautions with patient.   Required Braces or Orthoses: Knee Immobilizer - Left Restrictions Weight Bearing Restrictions: Yes LLE Weight Bearing: Touchdown weight bearing   Therapy/Group: Individual Therapy  Tonny Branch 05/02/2018, 4:59 PM

## 2018-05-02 NOTE — Evaluation (Signed)
Physical Therapy Assessment and Plan  Patient Details  Name: Emma Lewis MRN: [id number] Date of Birth: [birth year]  PT Diagnosis: Difficulty walking, Muscle weakness and Pain in joint Rehab Potential: Good ELOS: 14-16days    Today's Date: 05/02/2018 PT Individual Time: 1100-1200 PT Individual Time Calculation (min): 60 min    Problem List:  Patient Active Problem List   Diagnosis Date Noted  . Leukocytosis   . Postoperative pain   . Failure of left total hip arthroplasty ([place]) 05/01/2018  . Acute blood loss anemia   . Leukemoid reaction   . 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 Joint effusion of pelvis or thigh, left 04/22/2018  . Trochanteric bursitis, left hip 04/22/2018  . History of left hip replacement 11/12/2017  . Bilateral primary osteoarthritis of knee 11/12/2017  . GERD (gastroesophageal reflux disease) 01/17/2017  . Hypothyroidism 01/17/2017  . Vertigo 01/16/2017  . Nausea and vomiting 01/16/2017  . Essential hypertension 01/16/2017  . Fatty liver 01/16/2017  . Failed total hip arthroplasty ([place]) 06/10/2016  . Pelvic mass 06/15/2015  . Closed wedge compression fracture of fifth lumbar vertebra ([place])   . Cervical spondylosis 02/09/2014  . DDD (degenerative disc disease), lumbar 05/13/2013  . OA (osteoarthritis) 05/13/2013    Past Medical History:  Past Medical History:  Diagnosis Date  . Arthritis    "all over my body" (04/29/2018)  . Chronic lower back pain   . Fatty liver   . Fever blister    Takes Acyclovir  . GERD (gastroesophageal reflux disease)   . Hyperlipemia   . Hypothyroidism   . Migraines   . Osteopenia   . Pneumonia    "once; years ago" (04/29/2018)  . PONV (postoperative nausea and vomiting)   . Pre-diabetes   . Seasonal allergies   . Vertigo    Past Surgical History:  Past Surgical History:  Procedure Laterality Date  . ANTERIOR CERVICAL DECOMP/DISCECTOMY FUSION N/A 02/09/2014   Procedure: ANTERIOR CERVICAL DECOMPRESSION/DISCECTOMY FUSION 2  LEVELS;  Surgeon: [name], MD;  Location: [place] [facility];  Service: Orthopedics;  Laterality: N/A;  C4-5, C5-6 Anterior Cervical Discectomy and Fusion, Allograft, Plate  . BACK SURGERY    . CARPAL TUNNEL RELEASE Right 2018  . CATARACT EXTRACTION W/ INTRAOCULAR LENS  IMPLANT, BILATERAL    . COLONOSCOPY    . FIXATION KYPHOPLASTY LUMBAR SPINE  X 2  . HIP ARTHROPLASTY Left 2011  . JOINT REPLACEMENT    . KNEE ARTHROSCOPY Right   . PATELLA FRACTURE SURGERY Right ~ 1990   "put metal in"  . PATELLA HARDWARE REMOVAL Right    "took the metal out"  . REVISION TOTAL HIP ARTHROPLASTY Left 04/29/2018  . ROBOTIC ASSISTED BILATERAL SALPINGO OOPHERECTOMY Bilateral 08/01/2015   Procedure: ROBOTIC ASSISTED BILATERAL SALPINGO OOPHORECTOMY;  Surgeon: Everitt Amber, MD;  Location: [facility];  Service: Gynecology;  Laterality: Bilateral;  . SHOULDER OPEN ROTATOR CUFF REPAIR Right    3 TOTAL  . SHOULDER SURGERY Right   . TOTAL HIP ARTHROPLASTY Right 01/21/2018   Procedure: RIGHT TOTAL HIP ARTHROPLASTY DIRECT ANTERIOR;  Surgeon: [name], MD;  Location: [place] [facility];  Service: Orthopedics;  Laterality: Right;  . TOTAL HIP REVISION Left 06/10/2016   Procedure: TOTAL HIP REVISION ARTHROPLASTY;  Surgeon: Rod Can, MD;  Location: [facility];  Service: Orthopedics;  Laterality: Left;  . TOTAL HIP REVISION Left 04/29/2018   Procedure: left total hip arthroplasty revision posterior approach;  Surgeon: [name], MD;  Location: [facility];  Service: Orthopedics;  Laterality:  Left;  . WRIST GANGLION EXCISION Left 1970s    Assessment & Plan Clinical Impression: Patient is a 67 year old right-handed female history of prediabetes, right total hip arthroplasty 01/21/2018 as well as a left total hip replacement 2011 with revision 2017 per Dr. Lyla Glassing. Per chart review lives with spouse independent with assistive device prior to admission. One level home with 3 steps to entry. Presented 04/29/2018 with increasing left hip pain. No change with  conservative care. X-rays show calcar resorption loosening around the acetabular shell. Underwent left total hip arthroplasty revision posterior approach of all components acetabular and femur 04/29/2018 per Dr. Lorin Mercy. Hospital course pain management. Touchdown weightbearing left lower extremity with posterior hip precautions. Placed on aspirin for DVT prophylaxis. Venous Doppler studies negative. Acute blood loss anemia 9.9.  Patient transferred to CIR on 05/01/2018 .   Patient currently requires max with mobility secondary to muscle weakness and muscle joint tightness, decreased cardiorespiratoy endurance and decreased standing balance, decreased balance strategies and difficulty maintaining precautions.  Prior to hospitalization, patient was independent  with mobility and lived with Spouse in a House home.  Home access is 3 in front, 8 in backStairs to enter.  Patient will benefit from skilled PT intervention to maximize safe functional mobility, minimize fall risk and decrease caregiver burden for planned discharge home with intermittent assist.  Anticipate patient will benefit from follow up [facility] at discharge.  PT - End of Session Activity Tolerance: Tolerates 10 - 20 min activity with multiple rests Endurance Deficit: Yes PT Assessment Rehab Potential (ACUTE/IP ONLY): Good PT Barriers to Discharge: Inaccessible home environment;Home environment access/layout;Weight bearing restrictions PT Patient demonstrates impairments in the following area(s): Balance;Edema;Endurance;Pain;Safety;Sensory;Skin Integrity PT Transfers Functional Problem(s): Bed Mobility;Bed to Chair;Car;Furniture;Floor PT Locomotion Functional Problem(s): Ambulation;Wheelchair Mobility PT Plan PT Intensity: Minimum of 1-2 x/day ,45 to 90 minutes PT Frequency: 5 out of 7 days PT Duration Estimated Length of Stay: 14-16days  PT Treatment/Interventions: Ambulation/gait training;Balance/vestibular training;Community  reintegration;DME/adaptive equipment instruction;Discharge planning;Disease management/prevention;Functional mobility training;Neuromuscular re-education;Patient/family education;Pain management;Psychosocial support;Skin care/wound management;Stair training;Splinting/orthotics;Therapeutic Activities;Therapeutic Exercise;UE/LE Coordination activities;UE/LE Strength taining/ROM;Visual/perceptual remediation/compensation;Wheelchair propulsion/positioning PT Transfers Anticipated Outcome(s): min assist with LRAD  PT Locomotion Anticipated Outcome(s): Supervision assist WC mobility. min assist ambulation for short distances.  PT Recommendation Recommendations for Other Services: Therapeutic Recreation consult Therapeutic Recreation Interventions: Outing/community reintergration;Stress management Follow Up Recommendations: Home health PT Patient destination: Home Equipment Recommended: Rolling walker with 5" wheels;Wheelchair (measurements);Wheelchair cushion (measurements)  Skilled Therapeutic Intervention Pt received supine in bed and agreeable to PT. Supine>sit transfer with moderate assist and moderate cues for safety. Stand pivot transfer with UE Support on therapist due to poor TDWB through the LLE. PT instructed patient in PT Evaluation and initiated treatment intervention; see below for results. PT educated patient in [place], rehab potential, rehab goals, and discharge recommendations. Patient returned to room and left sitting in [facility] with call bell in reach and all needs met.        PT Evaluation Precautions/Restrictions Precautions Precautions: Fall;Posterior Hip Precaution Booklet Issued: Yes (comment) Precaution Comments: Reviewed posterior hip precautions with patient.   Required Braces or Orthoses: Knee Immobilizer - Left Restrictions Weight Bearing Restrictions: Yes LLE Weight Bearing: Touchdown weight bearing General   Vital Signs  Pain Pain Assessment Pain Scale: 0-10 Pain Score:  5  Pain Type: Surgical pain Pain Location: Hip Pain Orientation: Left Pain Descriptors / Indicators: Aching;Sore;Sharp Pain Onset: Gradual Pain Intervention(s): Medication (See eMAR) Home Living/Prior Functioning Home Living Available Help at Discharge: Family;Available 24 hours/day Type of Home:  House Home Access: Stairs to enter [employer] of Steps: 3 in front, 8 in back Entrance Stairs-Rails: Left Home Layout: One level Bathroom Shower/Tub: Multimedia programmer: Handicapped height Bathroom Accessibility: Yes  Lives With: Spouse Prior Function Level of Independence: Independent with basic ADLs  Able to Take Stairs?: Yes Driving: Yes Comments: using RW, husband assisting with some LB dressing (socks) Vision/Perception  Perception Perception: Within Functional Limits Praxis Praxis: Intact  Cognition Overall Cognitive Status: Within Functional Limits for tasks assessed(reports feeling "foggy" from medication) Arousal/Alertness: Awake/alert Orientation Level: Oriented X4 Memory: Appears intact(unable to recall hip precautions) Sensation Sensation Light Touch: Appears Intact Additional Comments: "it hurts" Coordination Gross Motor Movements are Fluid and Coordinated: No Fine Motor Movements are Fluid and Coordinated: No Coordination and Movement Description: UE WFL Motor  Motor Motor: Within Functional Limits  Mobility Bed Mobility Bed Mobility: Rolling Right Rolling Right: Moderate Assistance - Patient 50-74% Locomotion  Gait Ambulation: Yes Gait Assistance: Moderate Assistance - Patient 50-74% Gait Distance (Feet): 4 Feet Assistive device: Rolling walker Gait Assistance Details: Verbal cues for technique;Verbal cues for precautions/safety;Verbal cues for gait pattern;Visual cues/gestures for precautions/safety;Visual cues for safe use of DME/AE;Tactile cues for weight beaing Gait Assistance Details: KI with gait and moderate cues for  safety Gait Gait: Yes Gait Pattern: Impaired Gait Pattern: Step-to pattern;Antalgic Stairs / Additional Locomotion Stairs: No Architect: Yes Wheelchair Assistance: Chartered loss adjuster: Both upper extremities Wheelchair Parts Management: Needs assistance Distance: 175f   Trunk/Postural Assessment  Cervical Assessment Cervical Assessment: Within Functional Limits Thoracic Assessment Thoracic Assessment: Within Functional Limits Lumbar Assessment Lumbar Assessment: Within Functional Limits Postural Control Postural Control: Within Functional Limits  Balance Balance Balance Assessed: Yes Static Sitting Balance Static Sitting - Balance Support: Feet supported Static Sitting - Level of Assistance: 5: Stand by assistance Static Standing Balance Static Standing - Level of Assistance: 2: Max assist Static Standing - Comment/# of Minutes: during clothing management Extremity Assessment  RUE Assessment RUE Assessment: Within Functional Limits   RLE Assessment RLE Assessment: Within Functional Limits LLE Assessment LLE Assessment: Exceptions to [facility] Limited Liability PartnershipGeneral Strength Comments: KI. grossly 2+/5 hip flexion and abduction in standing.     Refer to Care Plan for Long Term Goals  Recommendations for other services: Therapeutic Recreation  Kitchen group, Stress management and Outing/community reintegration  Discharge Criteria: Patient will be discharged from PT if patient refuses treatment 3 consecutive times without medical reason, if treatment goals not met, if there is a change in medical status, if patient makes no progress towards goals or if patient is discharged from hospital.  The above assessment, treatment plan, treatment alternatives and goals were discussed and mutually agreed upon: by patient  ALorie Phenix10/11/2017, 11:26 AM

## 2018-05-02 NOTE — IPOC Note (Addendum)
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  Hospital Problems: Active Problems:   Failure of left total hip arthroplasty (HCC)   Acute blood loss anemia   Leukemoid reaction   Leukocytosis   Postoperative pain     Functional Problem List: Nursing Endurance, Motor, Pain, Safety, Skin Integrity  PT Balance, Edema, Endurance, Pain, Safety, Sensory, Skin Integrity  OT Balance, Endurance, Pain, Safety  SLP    TR         Basic ADL's: OT Grooming, Bathing, Dressing, Toileting     Advanced  ADL's: OT       Transfers: PT Bed Mobility, Bed to Chair, Car, [employer], Futures trader, Metallurgist: PT Ambulation, Emergency planning/management officer     Additional Impairments: OT None  SLP        TR      Anticipated Outcomes Item Anticipated Outcome  Self Feeding no goal  Swallowing      Basic self-care  MOD I  Toileting  MOD I    Bathroom Transfers MOD I  Bowel/Bladder  Continent to bowel and bladder with min. assist.  Transfers  min assist with LRAD   Locomotion  Supervision assist WC mobility. min assist ambulation for short distances.   Communication     Cognition     Pain  Less than 3,on 1 to 10 scale.  Safety/Judgment  Free from falls during her stay in rehab.   Therapy Plan: PT Intensity: Minimum of 1-2 x/day ,45 to 90 minutes PT Frequency: 5 out of 7 days PT Duration Estimated Length of Stay: 14-16days  OT Intensity: Minimum of 1-2 x/day, 45 to 90 minutes OT Frequency: 5 out of 7 days OT Duration/Estimated Length of Stay: 14-18      Team Interventions: Nursing Interventions Patient/Family Education, Pain Management, Discharge Planning, Skin Care/Wound Management  PT interventions Ambulation/gait training, Training and development officer, Academic librarian, Engineer, drilling, Discharge planning,  Disease management/prevention, Functional mobility training, Neuromuscular re-education, Patient/family education, Pain management, Psychosocial support, Skin care/wound management, Stair training, Splinting/orthotics, Therapeutic Activities, Therapeutic Exercise, UE/LE Coordination activities, UE/LE Strength taining/ROM, Visual/perceptual remediation/compensation, Wheelchair propulsion/positioning  OT Interventions Balance/vestibular training, Discharge planning, Pain management, Self Care/advanced ADL retraining, Therapeutic Activities, UE/LE Coordination activities, Cognitive remediation/compensation, Functional mobility training, Disease mangement/prevention, Patient/family education, Skin care/wound managment, Therapeutic Exercise, Visual/perceptual remediation/compensation, Academic librarian, Engineer, drilling, Neuromuscular re-education, Psychosocial support, Splinting/orthotics, UE/LE Strength taining/ROM, Wheelchair propulsion/positioning  SLP Interventions    TR Interventions    SW/CM Interventions  Psychosocial assessment, Pt & Family Education and Discharge Planning   Barriers to Discharge MD  Medical stability and Pain  Nursing      PT Inaccessible home environment, Home environment access/layout, Weight bearing restrictions    OT      SLP      SW       Team Discharge Planning: Destination: PT-Home ,OT- Home , SLP-  Projected Follow-up: PT-Home health PT, OT-  Home health OT, SLP-  Projected Equipment Needs: PT-Rolling walker with 5" wheels, Wheelchair (measurements), Wheelchair cushion (measurements), OT- 3 in 1 bedside comode, Tub/shower bench, To be determined, SLP-  Equipment Details: PT- , OT-  Patient/family involved in discharge planning: PT- Patient,  OT-Patient, SLP-   MD ELOS: 12-14 days. Medical Rehab Prognosis:  Excellent Assessment: 67 year old right-handed female history of prediabetes, right total hip arthroplasty 01/21/2018 as well as a  left total  hip replacement 2011 with revision 2017 per Dr. Lyla Glassing. Presented 04/29/2018 with increasing left hip pain. No change with conservative care. X-rays show calcar resorption loosening around the acetabular shell. Underwent left total hip arthroplasty revision posterior approach of all components acetabular and femur 04/29/2018 per Dr. Lorin Mercy. Hospital course pain management. Touchdown weightbearing left lower extremity with posterior hip precautions. Placed on aspirin for DVT prophylaxis. Acute blood loss anemia.  Patient with resulting functional deficits with mobility, transfers, self-care.  We will set goals for Supervision/Mod I with PT/OT.     See Team Conference Notes for weekly updates to the plan of care

## 2018-05-02 NOTE — Progress Notes (Signed)
[facility] PHYSICAL MEDICINE & REHABILITATION PROGRESS NOTE  Subjective/Complaints: Patient seen sitting up in bed this morning.  She states she slept fairly well overnight but did wake up due to pain one time.  She states she is ready to begin therapies.  ROS: Denies CP, SOB, nausea, vomiting, diarrhea.  Objective: Vital Signs: Blood pressure 111/63, pulse 83, temperature 98.4 F (36.9 C), temperature source Oral, resp. rate 16, height 5\' 5"  (1.651 m), weight 86.3 kg, SpO2 97 %. No results found. Recent Labs    04/30/18 0553 05/01/18 0654  WBC 10.3 13.3*  HGB 10.0* 9.9*  HCT 31.5* 31.0*  PLT 207 206   Recent Labs    04/29/18 1025 04/30/18 0553  NA 139 140  K 4.0 4.3  CL 106 107  CO2 22 28  GLUCOSE 87 112*  BUN 15 9  CREATININE 0.72 0.64  CALCIUM 10.0 8.8*    Physical Exam: BP 111/63 (BP Location: Left Arm)   Pulse 83   Temp 98.4 F (36.9 C) (Oral)   Resp 16   Ht 5\' 5"  (1.651 m)   Wt 86.3 kg   SpO2 97%   BMI 31.66 kg/m  Constitutional: She appears well-developed. No distress.  HENT: Normocephalic.  Atraumatic. Eyes: EOMI.  No discharge. Cardiovascular: Normal rate and regular rhythm.  No JVD. Respiratory: Effort normal.  Clear. GI: Bowel sounds positive.  She exhibits no distension.  Musculoskeletal:  Left hip swollen and tender with palpation and attempts at AROM/PROM.  Neurological: She is alert and oriented  Follows full commands.  Motor: Bilateral UE 5/5.  RLE 4+/5 prox to distal.  LLE 1/5 HF, KE (pain inhibition), 4-/5 ADF Skin: Hip incision with dressing C/D/I  Assessment/Plan: 1. 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  Physiatrist is providing close team supervision and 24 hour management of active medical problems listed below.  Physiatrist and rehab team continue to  assess barriers to discharge/monitor patient progress toward functional and medical goals  Care Tool:  Bathing              Bathing assist       Upper Body Dressing/Undressing Upper body dressing        Upper body assist      Lower Body Dressing/Undressing Lower body dressing            Lower body assist       Toileting Toileting    Toileting assist       Transfers Chair/bed transfer  Transfers assist           Locomotion Ambulation   Ambulation assist              Walk 10 feet activity   Assist           Walk 50 feet activity   Assist           Walk 150 feet activity   Assist           Walk 10 feet on uneven surface  activity   Assist           Wheelchair     Assist               Wheelchair 50 feet with 2 turns activity    Assist            Wheelchair 150 feet  activity     Assist            Medical Problem List and Plan:  1. 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 Touchdown weightbearing as well as history of right total hip arthroplasty   Begin CIR  Notes reviewed- metallosis after total hip replacement in 2011 with revision in 2017, labs reviewed 2. DVT Prophylaxis/Anticoagulation: SCDs.   Recent Venous Doppler studies negative  3. Pain Management: Oxycodone as needed   ice to left hip   Consider scheduled pain medication prior to therapies if pain is prohibitive.  4. Mood: Provide emotional support  5. Neuropsych: This patient is capable of making decisions on her own behalf.  6. Skin/Wound Care: Routine skin checks  -maintain current dressing to left hip  7. Fluids/Electrolytes/Nutrition: Routine in and outs   BMP within acceptable range on 10/3  Labs ordered for Monday 8. Acute blood loss anemia.    Hemoglobin 9.9  on 10/4  Labs ordered for Monday 9. Hypothyroidism. Synthroid  10. Hyperlipidemia. Zocor  11. Constipation. scheduled bowel meds 12. GERD. Protonix  13. Prediabetes. Hemoglobin A1c 5.8  14. Leukocytosis:   WBC's 13.3 on 10/4  Afebrile  Labs ordered for Monday  LOS: 1 days A FACE TO FACE EVALUATION WAS PERFORMED  Emma Lewis Emma Lewis 05/02/2018, 7:31 AM

## 2018-05-03 ENCOUNTER — Inpatient Hospital Stay (HOSPITAL_COMMUNITY): Payer: Medicare Other

## 2018-05-03 LAB — BPAM RBC
BLOOD PRODUCT EXPIRATION DATE: 201910072359
BLOOD PRODUCT EXPIRATION DATE: 201910102359
BLOOD PRODUCT EXPIRATION DATE: 201910252359
ISSUE DATE / TIME: 201909121111
ISSUE DATE / TIME: 201910021327
UNIT TYPE AND RH: 6200
Unit Type and Rh: 6200
Unit Type and Rh: 6200

## 2018-05-03 LAB — TYPE AND SCREEN
ABO/RH(D): A POS
ANTIBODY SCREEN: NEGATIVE
UNIT DIVISION: 0
Unit division: 0
Unit division: 0

## 2018-05-03 MED ORDER — BISACODYL 10 MG RE SUPP
10.0000 mg | Freq: Every day | RECTAL | Status: DC | PRN
  Administered 2018-05-03: 10 mg via RECTAL
  Filled 2018-05-03: qty 1

## 2018-05-03 MED ORDER — SENNA 8.6 MG PO TABS
1.0000 | ORAL_TABLET | Freq: Every day | ORAL | Status: DC
  Administered 2018-05-03 – 2018-05-11 (×9): 8.6 mg via ORAL
  Filled 2018-05-03 (×10): qty 1

## 2018-05-03 MED ORDER — DOCUSATE SODIUM 100 MG PO CAPS
100.0000 mg | ORAL_CAPSULE | Freq: Three times a day (TID) | ORAL | Status: DC
  Administered 2018-05-03 – 2018-05-14 (×26): 100 mg via ORAL
  Filled 2018-05-03 (×29): qty 1

## 2018-05-03 NOTE — Progress Notes (Signed)
Occupational Therapy Session Note  Patient Details  Name: RHYANN BERTON MRN: [id number] Date of Birth: 1951/02/22  Today's Date: 05/03/2018 OT Individual Time: 1219-7588 OT Individual Time Calculation (min): 48 min    Short Term Goals: Week 1:  OT Short Term Goal 1 (Week 1): Pt will sit to stand with MAX A of 1 caregiver OT Short Term Goal 2 (Week 1): Pt will transfer with LRAD with MOD A of 1 caregiver to w/c in prep for [facility] transfer OT Short Term Goal 3 (Week 1): Pt will don footwear with min VC and  AE OT Short Term Goal 4 (Week 1): Pt will dress UB with set up  Skilled Therapeutic Interventions/Progress Updates:    1:1. Pt recived seated on BSC with RN assisting with BM. Pt sit to stand with MOD A from [facility] and RW while RN completes hygiene/clothing management/bringing w/c behind pt. Pt brushes teeth with set up. Pt propels w/c to all tx destinations for BUE strengthening with supervision. Pt completes 2x40 ball toss with 1.5# wrist weights iwht VC for maintaining 90* shoulder flexion instead of resting arms in between passes for endurance and arm strenghtening (overhead pass and chest pass). Pt completes stand pivot transfer back to bed with MOD A and RW. Pt cued to not cross legs when transitioning and scooting d/t hip precautions. Exited session with call light in reach and all needs met  Therapy Documentation Precautions:  Precautions Precautions: Fall, Posterior Hip Precaution Booklet Issued: Yes (comment) Precaution Comments: Reviewed posterior hip precautions with patient.   Required Braces or Orthoses: Knee Immobilizer - Left Restrictions Weight Bearing Restrictions: Yes LLE Weight Bearing: Touchdown weight bearing General:   Vital Signs: Therapy Vitals Temp: 98.6 F (37 C) Temp Source: Oral Pulse Rate: 80 Resp: 17 BP: (!) 104/52 Patient Position (if appropriate): Lying Oxygen Therapy SpO2: 93 % O2 Device: Room Air Pain: Pain Assessment Pain Scale: 0-10 Pain  Score: 6  Pain Type: Surgical pain Pain Location: Hip Pain Orientation: Left Pain Descriptors / Indicators: Aching Pain Onset: On-going Pain Intervention(s): Medication (See eMAR)  Therapy/Group: Individual Therapy  Tonny Branch 05/03/2018, 9:45 AM

## 2018-05-03 NOTE — Progress Notes (Signed)
[name] PHYSICAL MEDICINE & REHABILITATION PROGRESS NOTE  Subjective/Complaints: Patient seen sitting up in bed this AM.  She states she slept well overnight.  She states she had a good first day of therapies yesterday, but notes some left hip and knee pain.  ROS: Denies CP, SOB, nausea, vomiting, diarrhea.  Objective: Vital Signs: Blood pressure (!) 104/52, pulse 80, temperature 98.6 F (37 C), temperature source Oral, resp. rate 17, height 5\' 5"  (1.651 m), weight 86.3 kg, SpO2 93 %. No results found. Recent Labs    05/01/18 0654  WBC 13.3*  HGB 9.9*  HCT 31.0*  PLT 206   No results for input(s): NA, K, CL, CO2, GLUCOSE, BUN, CREATININE, CALCIUM in the last 72 hours.  Physical Exam: BP (!) 104/52 (BP Location: Left Arm)   Pulse 80   Temp 98.6 F (37 C) (Oral)   Resp 17   Ht 5\' 5"  (1.651 m)   Wt 86.3 kg   SpO2 93%   BMI 31.66 kg/m  Constitutional: She appears well-developed. No distress.  HENT: Normocephalic.  Atraumatic. Eyes: EOMI.  No discharge. Cardiovascular: RRR.  No JVD. Respiratory: Effort normal.  Clear. GI: Bowel sounds positive.  She exhibits no distension.  Musculoskeletal:  Left hip edema and tenderness Neurological: She is alert and oriented  Follows full commands.  Motor:  RLE 4+/5 prox to distal, stable.  LLE 1/5 HF, KE (pain inhibition), 4-/5 ADF, stable Skin: Hip incision with dressing C/D/I  Assessment/Plan: 1. 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  Physiatrist is providing close team supervision and 24 hour management of active medical problems listed below.  Physiatrist and rehab team continue to assess barriers to discharge/monitor patient progress toward functional and medical goals  Care Tool:  Bathing    Body parts bathed by patient: Right arm, Left arm,  Chest, Abdomen, Front perineal area, Right upper leg, Left upper leg, Face   Body parts bathed by helper: Buttocks, Right lower leg, Left lower leg     Bathing assist Assist Level: Minimal Assistance - Patient > 75%     Upper Body Dressing/Undressing Upper body dressing   What is the patient wearing?: Bra, Pull over shirt    Upper body assist Assist Level: Minimal Assistance - Patient > 75%    Lower Body Dressing/Undressing Lower body dressing      What is the patient wearing?: Underwear/pull up, Orthosis     Lower body assist Assist for lower body dressing: Total Assistance - Patient < 25%     Toileting Toileting Toileting Activity did not occur Landscape architect and hygiene only): N/A (no void or bm)  Toileting assist Assist for toileting: Maximal Assistance - Patient 25 - 49%     Transfers Chair/bed transfer  Transfers assist     Chair/bed transfer assist level: Maximal Assistance - Patient 25 - 49%     Locomotion Ambulation   Ambulation assist      Assist level: Moderate Assistance - Patient 50 - 74%   Max distance: 4   Walk 10 feet activity   Assist  Walk 10 feet activity did not occur: Safety/medical concerns        Walk 50 feet activity   Assist Walk 50 feet with 2 turns activity did not occur: Safety/medical concerns         Walk 150 feet activity  Assist Walk 150 feet activity did not occur: Safety/medical concerns         Walk 10 feet on uneven surface  activity   Assist Walk 10 feet on uneven surfaces activity did not occur: Safety/medical concerns         Wheelchair     Assist Will patient use wheelchair at discharge?: Yes Type of Wheelchair: Manual    Wheelchair assist level: Contact Guard/Touching assist Max wheelchair distance: 126ft     Wheelchair 50 feet with 2 turns activity    Assist        Assist Level: Contact Guard/Touching assist   Wheelchair 150 feet activity     Assist      Assist Level: Contact Guard/Touching assist      Medical Problem List and Plan:  1. 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 Touchdown weightbearing as well as history of right total hip arthroplasty   Continue CIR 2. DVT Prophylaxis/Anticoagulation: SCDs.   Recent Venous Doppler studies negative  3. Pain Management: Oxycodone as needed   ice to left hip   Consider scheduled pain medication prior to therapies if pain is prohibitive.  4. Mood: Provide emotional support  5. Neuropsych: This patient is capable of making decisions on her own behalf.  6. Skin/Wound Care: Routine skin checks  -maintain current dressing to left hip  7. Fluids/Electrolytes/Nutrition: Routine in and outs   BMP within acceptable range on 10/3  Labs ordered for tomorrow 8. Acute blood loss anemia.    Hemoglobin 9.9 on 10/4  Labs ordered for tomorrow 9. Hypothyroidism. Synthroid  10. Hyperlipidemia. Zocor  11. Constipation. scheduled bowel meds 12. GERD. Protonix  13. Prediabetes. Hemoglobin A1c 5.8  14. Leukocytosis:   WBC's 13.3 on 10/4  Afebrile  Labs ordered for tomorrow  LOS: 2 days A FACE TO FACE EVALUATION WAS PERFORMED  Emma Lewis 05/03/2018, 7:09 AM

## 2018-05-04 ENCOUNTER — Inpatient Hospital Stay (HOSPITAL_COMMUNITY): Payer: Medicare Other

## 2018-05-04 ENCOUNTER — Inpatient Hospital Stay (HOSPITAL_COMMUNITY): Payer: Medicare Other | Admitting: Occupational Therapy

## 2018-05-04 ENCOUNTER — Inpatient Hospital Stay (HOSPITAL_COMMUNITY): Payer: Medicare Other | Admitting: Physical Therapy

## 2018-05-04 DIAGNOSIS — R740 Nonspecific elevation of levels of transaminase and lactic acid dehydrogenase [LDH]: Secondary | ICD-10-CM

## 2018-05-04 DIAGNOSIS — E8809 Other disorders of plasma-protein metabolism, not elsewhere classified: Secondary | ICD-10-CM

## 2018-05-04 DIAGNOSIS — E46 Unspecified protein-calorie malnutrition: Secondary | ICD-10-CM

## 2018-05-04 DIAGNOSIS — R7401 Elevation of levels of liver transaminase levels: Secondary | ICD-10-CM

## 2018-05-04 LAB — CBC WITH DIFFERENTIAL/PLATELET
ABS IMMATURE GRANULOCYTES: 0 10*3/uL (ref 0.0–0.1)
Basophils Absolute: 0.1 10*3/uL (ref 0.0–0.1)
Basophils Relative: 1 %
Eosinophils Absolute: 0.6 10*3/uL (ref 0.0–0.7)
Eosinophils Relative: 8 %
HCT: 30.5 % — ABNORMAL LOW (ref 36.0–46.0)
HEMOGLOBIN: 9.6 g/dL — AB (ref 12.0–15.0)
Immature Granulocytes: 0 %
LYMPHS PCT: 40 %
Lymphs Abs: 3.3 10*3/uL (ref 0.7–4.0)
MCH: 30.1 pg (ref 26.0–34.0)
MCHC: 31.5 g/dL (ref 30.0–36.0)
MCV: 95.6 fL (ref 78.0–100.0)
MONO ABS: 0.7 10*3/uL (ref 0.1–1.0)
Monocytes Relative: 9 %
NEUTROS PCT: 42 %
Neutro Abs: 3.5 10*3/uL (ref 1.7–7.7)
Platelets: 219 10*3/uL (ref 150–400)
RBC: 3.19 MIL/uL — AB (ref 3.87–5.11)
RDW: 14.9 % (ref 11.5–15.5)
WBC: 8.2 10*3/uL (ref 4.0–10.5)

## 2018-05-04 LAB — COMPREHENSIVE METABOLIC PANEL
ALBUMIN: 2.6 g/dL — AB (ref 3.5–5.0)
ALT: 58 U/L — AB (ref 0–44)
AST: 59 U/L — ABNORMAL HIGH (ref 15–41)
Alkaline Phosphatase: 205 U/L — ABNORMAL HIGH (ref 38–126)
Anion gap: 4 — ABNORMAL LOW (ref 5–15)
BUN: 10 mg/dL (ref 8–23)
CO2: 27 mmol/L (ref 22–32)
CREATININE: 0.55 mg/dL (ref 0.44–1.00)
Calcium: 8.6 mg/dL — ABNORMAL LOW (ref 8.9–10.3)
Chloride: 106 mmol/L (ref 98–111)
GFR calc Af Amer: >60 mL/min (ref >60)
GFR calc non Af Amer: >60 mL/min (ref >60)
GLUCOSE: 99 mg/dL (ref 70–99)
Potassium: 3.9 mmol/L (ref 3.5–5.1)
Sodium: 137 mmol/L (ref 135–145)
Total Bilirubin: 1 mg/dL (ref 0.3–1.2)
Total Protein: 5.9 g/dL — ABNORMAL LOW (ref 6.5–8.1)

## 2018-05-04 MED ORDER — PRO-STAT SUGAR FREE PO LIQD
30.0000 mL | Freq: Two times a day (BID) | ORAL | Status: DC
  Administered 2018-05-04 – 2018-05-15 (×22): 30 mL via ORAL
  Filled 2018-05-04 (×22): qty 30

## 2018-05-04 MED ORDER — METHOCARBAMOL 500 MG PO TABS
500.0000 mg | ORAL_TABLET | Freq: Four times a day (QID) | ORAL | Status: DC | PRN
  Administered 2018-05-04 – 2018-05-06 (×5): 500 mg via ORAL
  Filled 2018-05-04 (×5): qty 1

## 2018-05-04 NOTE — Progress Notes (Signed)
Social Work  Social Work Assessment and Plan  Patient Details  Name: Emma Lewis MRN: [id number] Date of Birth: September 29, 1950  Today's Date: 05/04/2018  Problem List:  Patient Active Problem List   Diagnosis Date Noted  . Hypoalbuminemia due to protein-calorie malnutrition ([place])   . Transaminitis   . Leukocytosis   . Postoperative pain   . Failure of left total hip arthroplasty ([place]) 05/01/2018  . Acute blood loss anemia   . Leukemoid reaction   . 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 Joint effusion of pelvis or thigh, left 04/22/2018  . Trochanteric bursitis, left hip 04/22/2018  . History of left hip replacement 11/12/2017  . Bilateral primary osteoarthritis of knee 11/12/2017  . GERD (gastroesophageal reflux disease) 01/17/2017  . Hypothyroidism 01/17/2017  . Vertigo 01/16/2017  . Nausea and vomiting 01/16/2017  . Essential hypertension 01/16/2017  . Fatty liver 01/16/2017  . Failed total hip arthroplasty ([place]) 06/10/2016  . Pelvic mass 06/15/2015  . Closed wedge compression fracture of fifth lumbar vertebra ([place])   . Cervical spondylosis 02/09/2014  . DDD (degenerative disc disease), lumbar 05/13/2013  . OA (osteoarthritis) 05/13/2013   Past Medical History:  Past Medical History:  Diagnosis Date  . Arthritis    "all over my body" (04/29/2018)  . Chronic lower back pain   . Fatty liver   . Fever blister    Takes Acyclovir  . GERD (gastroesophageal reflux disease)   . Hyperlipemia   . Hypothyroidism   . Migraines   . Osteopenia   . Pneumonia    "once; years ago" (04/29/2018)  . PONV (postoperative nausea and vomiting)   . Pre-diabetes   . Seasonal allergies   . Vertigo    Past Surgical History:  Past Surgical History:  Procedure Laterality Date  . ANTERIOR CERVICAL DECOMP/DISCECTOMY FUSION N/A 02/09/2014   Procedure: ANTERIOR CERVICAL DECOMPRESSION/DISCECTOMY FUSION 2 LEVELS;  Surgeon: [name], MD;  Location: [facility];  Service: Orthopedics;  Laterality: N/A;  C4-5, C5-6  Anterior Cervical Discectomy and Fusion, Allograft, Plate  . BACK SURGERY    . CARPAL TUNNEL RELEASE Right 2018  . CATARACT EXTRACTION W/ INTRAOCULAR LENS  IMPLANT, BILATERAL    . COLONOSCOPY    . FIXATION KYPHOPLASTY LUMBAR SPINE  X 2  . HIP ARTHROPLASTY Left 2011  . JOINT REPLACEMENT    . KNEE ARTHROSCOPY Right   . PATELLA FRACTURE SURGERY Right ~ 1990   "put metal in"  . PATELLA HARDWARE REMOVAL Right    "took the metal out"  . REVISION TOTAL HIP ARTHROPLASTY Left 04/29/2018  . ROBOTIC ASSISTED BILATERAL SALPINGO OOPHERECTOMY Bilateral 08/01/2015   Procedure: ROBOTIC ASSISTED BILATERAL SALPINGO OOPHORECTOMY;  Surgeon: Everitt Amber, MD;  Location: [facility];  Service: Gynecology;  Laterality: Bilateral;  . SHOULDER OPEN ROTATOR CUFF REPAIR Right    3 TOTAL  . SHOULDER SURGERY Right   . TOTAL HIP ARTHROPLASTY Right 01/21/2018   Procedure: RIGHT TOTAL HIP ARTHROPLASTY DIRECT ANTERIOR;  Surgeon: [name], MD;  Location: [facility];  Service: Orthopedics;  Laterality: Right;  . TOTAL HIP REVISION Left 06/10/2016   Procedure: TOTAL HIP REVISION ARTHROPLASTY;  Surgeon: Rod Can, MD;  Location: [facility];  Service: Orthopedics;  Laterality: Left;  . TOTAL HIP REVISION Left 04/29/2018   Procedure: left total hip arthroplasty revision posterior approach;  Surgeon: [name], MD;  Location: [facility];  Service: Orthopedics;  Laterality: Left;  . WRIST GANGLION EXCISION Left 1970s   Social History:  reports that she  has never smoked. She has never used smokeless tobacco. She reports that she drinks alcohol. She reports that she does not use drugs.  Family / Support Systems Marital Status: Married Patient Roles: Spouse, Parent, Volunteer Spouse/Significant Other: Bland Span [phone]  [phone] Children: Hulan Fess [phone]  [phone] Other Supports: Friends and church members Anticipated Caregiver: Husband Ability/Limitations of Caregiver: Husband works part time   Careers adviser: Evenings only Family Dynamics: Close knit family they have a grown son who is involved and many friends and church members who are supportive. Pt has been through this before and knows what she needs to do.  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    Abuse/Neglect Abuse/Neglect Assessment Can Be Completed: Yes Physical Abuse: Denies Verbal Abuse: Denies Sexual Abuse: Denies Exploitation of patient/patient's resources: Denies Self-Neglect: Denies  Emotional Status Pt's affect, behavior adn adjustment status: Pt is motivated to do well she is still recovering from her R-THR in 12/2017. She was doing well until she needed surgery again on her left hip. She is an independent woman and not one to ask others for help. She feels she will manage and be able to move around on her own when she leaves here. Recent Psychosocial Issues: past hip surgeries and her arthritis she has dealt with a long time Pyschiatric History: No issues deferred depression screen due to coping appropriately and doing well with all of this. If she can get her pain managed she will be good. Substance Abuse History: No issues  Patient / Family Perceptions, Expectations & Goals Pt/Family understanding of illness & functional limitations: Pt and husband are able to explain her hip surgery and the plan going forward with her treatment. She wants to get moving and back on her feet again. She talks with the MD daily and feels she has a good understanding. Premorbid pt/family roles/activities: Wife, Mom, retiree, church member, freind, Social research officer, government Anticipated changes in  roles/activities/participation: resume Pt/family expectations/goals: Pt states: " I want to be able to move around on my own so no one has to be right there with me."  Husband states: " She is stubborn and will get where she wants to go."  [employer]: Other (Comment)(has [facility] in the past) Premorbid Home Care/DME Agencies: Other (Comment)(has all DME) Transportation available at discharge: husband and friends  Discharge Planning Living Arrangements: Spouse/significant other Support Systems: Spouse/significant other, Children, Water engineer, Social worker community Type of Residence: Private residence Insurance Resources: [employer], Multimedia programmer (specify)([employer]) Museum/gallery curator Resources: Social Security, Family Support Financial Screen Referred: No Living Expenses: Own Money Management: Patient, Spouse Does the patient have any problems obtaining your medications?: No Home Management: Both she and husband due to pt has been limited with her hip surgery since 12/2017 Patient/Family Preliminary Plans: Return home with husband who is there most of the time but does work part time. They have friends and church members who are involved and supportive. Team feels will be here approx 10-12 days. Work on discharge needs. Social Work Anticipated Follow Up Needs: HH/OP  Clinical Impression Pleasant female who is set on getting back to her independent self before leaving here. She has been through hip surgeries multiple time snow but this one is worse and her WB is affected. She has supportive family  and friends. Will work on discharge needs.  Elease Hashimoto 05/04/2018, 11:17 AM

## 2018-05-04 NOTE — Progress Notes (Addendum)
Physical Therapy Session Note  Patient Details  Name: Emma Lewis MRN: [id number] Date of Birth: Sep 21, 1950  Today's Date: 05/04/2018 PT Individual Time: 1100-1200 PT Individual Time Calculation (min): 60 min   Short Term Goals: Week 1:  PT Short Term Goal 1 (Week 1): Pt will perform bed mobility with mod assist consistently  PT Short Term Goal 2 (Week 1): Pt will ambulate 62ft with mod assist and LRAD  PT Short Term Goal 3 (Week 1): Pt will perform bed<>WC transfer with mod assist and LRAD  PT Short Term Goal 4 (Week 1): Pt will propell WC >166ft with supervision assist.   Skilled Therapeutic Interventions/Progress Updates:   Pt sitting up in w/c, with LKI donned.    Seated Therapeutic exercise performed with LE to increase strength for functional mobility 15 x 1 R long arc quad knee extension, L/R ankle eversion,  bil glut sets, R hip flexion within hip precautions .  W/c propulsion using ibl UEs over level tile x 100' x 2 with supervision, cues for efficiency and turns  Pt began to scoot forward in chair, but stopped due to L anterior thigh spasms.  +2 required to scoot pt back into chair due to [place] catching on edge of w/c cushion. PT called Pryor Montes, RN who dispensed pain meds and muscle relaxant during session.   PT educated pt on diaphragmatic breathing with good results.  Discussed L hip precautions and L wt bearing precautions; pt able to state TDWBing and to avoid crossing legs; PT explained and demonstrated hip flexion precaution.  Pt stated that she preferred to stay up on w/c to eat lunch, and wait to get back in bed until meds had time to work.   Pt left resting in w/c with all needs within reach, ice pack on L hip, set up for lunch.     Therapy Documentation Precautions:  Precautions Precautions: Fall, Posterior Hip Precaution Booklet Issued: Yes (comment) Precaution Comments: Reviewed posterior hip precautions with patient.   Required Braces or Orthoses: Knee  Immobilizer - Left Restrictions Weight Bearing Restrictions: Yes LLE Weight Bearing: Touchdown weight bearing   Pain: Pain Assessment Pain Scale: 0-10 Pain Score: 10-Worst pain ever Pain Type: Acute pain Pain Location: Hip Pain Orientation: Left Pain Descriptors / Indicators: Aching;Discomfort Pain Onset: With Activity Pain Intervention(s): Medication (See eMAR), ice pack     Therapy/Group: Individual Therapy  Railey Glad 05/04/2018, 12:11 PM

## 2018-05-04 NOTE — Care Management Important Message (Signed)
Important Message  Patient Details  Name: Emma Lewis MRN: [id number] Date of Birth: 22-Dec-1950   Medicare Important Message Given:  Yes  IM signed on 05/01/2018   [name] 05/04/2018, 8:37 AM

## 2018-05-04 NOTE — Progress Notes (Signed)
Occupational Therapy Session Note  Patient Details  Name: Emma Lewis MRN: [id number] Date of Birth: 02/24/1951  Today's Date: 05/04/2018 OT Individual Time: 5188-4166 OT Individual Time Calculation (min): 74 min   Short Term Goals: Week 1:  OT Short Term Goal 1 (Week 1): Pt will sit to stand with MAX A of 1 caregiver OT Short Term Goal 2 (Week 1): Pt will transfer with LRAD with MOD A of 1 caregiver to w/c in prep for [facility] transfer OT Short Term Goal 3 (Week 1): Pt will don footwear with min VC and  AE OT Short Term Goal 4 (Week 1): Pt will dress UB with set up  Skilled Therapeutic Interventions/Progress Updates:    Pt greeted in bed with RN staff. Premedicated for pain. Pt still c/o L LE pain, so we started session with deep breathing exercises, education on mindfulness techniques for coping, and gentle massage to tender Lt thigh. OT also washed L LE with KI removed, and applied lotion with gentle retrograde massage completed for edema mgt. Afterwards, with KI donned, she completed stand pivot<w/c<elevated toilet with Mod A and cues for TDWB L LE. Assist required for managing her nightgown. She was able to complete hygiene while seated with L LE supported on adaptive footstool. Afterwards she transferred back to w/c and completed selected bathing tasks at sink, as well as oral care with supervision. She donned clean hospital gown, agreeable to proceed with dressing with next therapist. Pt remained up in w/c with all needs and ice applied to Lt hip for pain mgt.   Therapy Documentation Precautions:  Precautions Precautions: Fall, Posterior Hip Precaution Booklet Issued: Yes (comment) Precaution Comments: Reviewed posterior hip precautions with patient.   Required Braces or Orthoses: Knee Immobilizer - Left Restrictions Weight Bearing Restrictions: Yes LLE Weight Bearing: Touchdown weight bearing Pain: Pain Assessment Pain Scale: 0-10 Pain Score: 10-Worst pain ever Pain Type: Acute  pain Pain Location: Hip Pain Orientation: Left Pain Descriptors / Indicators: Aching;Discomfort Pain Onset: With Activity Pain Intervention(s): Medication (See eMAR) ADL:       Therapy/Group: Individual Therapy  Melvina Pangelinan A Roslind Michaux 05/04/2018, 12:21 PM

## 2018-05-04 NOTE — Progress Notes (Signed)
Physical Therapy Session Note  Patient Details  Name: Emma Lewis MRN: [id number] Date of Birth: Dec 15, 1950  Today's Date: 05/04/2018 PT Individual Time: 1500-1520 PT Individual Time Calculation (min): 20 min   Short Term Goals: Week 1:  PT Short Term Goal 1 (Week 1): Pt will perform bed mobility with mod assist consistently  PT Short Term Goal 2 (Week 1): Pt will ambulate 4ft with mod assist and LRAD  PT Short Term Goal 3 (Week 1): Pt will perform bed<>WC transfer with mod assist and LRAD  PT Short Term Goal 4 (Week 1): Pt will propell WC >124ft with supervision assist.   Skilled Therapeutic Interventions/Progress Updates: Pt received in w/c, c/o pain as below pre-medicated and agreeable to treatment. Attempted sit >stand with RW, unable to initiate d/t muscle spasms at L groin. Performed lateral scoot transfer to bed with transfer board and modA +2 for elevating LLE, board placement and scooting hips. [name] sit >supine for LE management. Remained in bed at end of session, all needs in reach. Pt missed 10 min d/t pain.      Therapy Documentation Precautions:  Precautions Precautions: Fall, Posterior Hip Precaution Booklet Issued: Yes (comment) Precaution Comments: Reviewed posterior hip precautions with patient.   Required Braces or Orthoses: Knee Immobilizer - Left Restrictions Weight Bearing Restrictions: Yes LLE Weight Bearing: Touchdown weight bearing Pain: Pain Assessment Pain Score: 5  Pain Type: Acute pain Pain Location: Hip Pain Orientation: Left Pain Descriptors / Indicators: Aching;Discomfort Pain Intervention(s): Medication (See eMAR)    Therapy/Group: Individual Therapy  Corliss Skains 05/04/2018, 3:26 PM

## 2018-05-04 NOTE — Care Management Note (Signed)
[facility] Individual Statement of Services  Patient Name:  Emma Lewis  Date:  05/04/2018  Welcome to the [facility].  Our goal is to provide you with an individualized program based on your diagnosis and situation, designed to meet your specific needs.  With this comprehensive rehabilitation program, you will be expected to participate in at least 3 hours of rehabilitation therapies Monday-Friday, with modified therapy programming on the weekends.  Your rehabilitation program will include the following services:  Physical Therapy (PT), Occupational Therapy (OT), 24 hour per day rehabilitation nursing, Case Management (Social Worker), Rehabilitation Medicine, Nutrition Services and Pharmacy Services  Weekly team conferences will be held on Wednesday to discuss your progress.  Your Social Worker will talk with you frequently to get your input and to update you on team discussions.  Team conferences with you and your family in attendance may also be held.  Expected length of stay: 12-14 days Overall anticipated outcome: independent-min assist level  Depending on your progress and recovery, your program may change. Your Social Worker will coordinate services and will keep you informed of any changes. Your Social Worker's name and contact numbers are listed  below.  The following services may also be recommended but are not provided by the [facility] will be made to provide these services after discharge if needed.  Arrangements include referral to agencies that provide these services.  Your insurance has been verified to be:  [employer] Your primary doctor is:  [name]  Pertinent information will be shared with your doctor and your insurance company.  Social Worker:  Ovidio Kin, [place] or ([phone]  Information discussed with and copy given to patient by: Elease Hashimoto, 05/04/2018, 11:19 AM

## 2018-05-04 NOTE — Progress Notes (Signed)
[place] PHYSICAL MEDICINE & REHABILITATION PROGRESS NOTE  Subjective/Complaints: Patient seen sitting up in bed this morning, about to work with therapies.  She states she slept well overnight after receiving pain medications.  ROS: Denies CP, SOB, nausea, vomiting, diarrhea.  Objective: Vital Signs: Blood pressure 125/62, pulse 88, temperature 98.1 F (36.7 C), temperature source Oral, resp. rate 19, height 5\' 5"  (1.651 m), weight 86.3 kg, SpO2 94 %. No results found. Recent Labs    05/04/18 0709  WBC 8.2  HGB 9.6*  HCT 30.5*  PLT 219   Recent Labs    05/04/18 0709  NA 137  K 3.9  CL 106  CO2 27  GLUCOSE 99  BUN 10  CREATININE 0.55  CALCIUM 8.6*    Physical Exam: BP 125/62 (BP Location: Left Arm)   Pulse 88   Temp 98.1 F (36.7 C) (Oral)   Resp 19   Ht 5\' 5"  (1.651 m)   Wt 86.3 kg   SpO2 94%   BMI 31.66 kg/m  Constitutional: She appears well-developed. No distress.  HENT: Normocephalic.  Atraumatic. Eyes: EOMI.  No discharge. Cardiovascular: RRR.  No JVD. Respiratory: Effort normal.  Clear. GI: Bowel sounds positive.  She exhibits no distension.  Musculoskeletal:  Left hip edema and tenderness Neurological: She is alert and oriented  Follows full commands.  Motor:  RLE 4+/5 prox to distal, unchanged.  LLE 1/5 HF, KE (pain inhibition), 4-/5 ADF, unchanged Skin: Hip incision with dressing C/D/I  Assessment/Plan: 1. 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  Physiatrist is providing close team supervision and 24 hour management of active medical problems listed below.  Physiatrist and rehab team continue to assess barriers to discharge/monitor patient progress toward functional and medical goals  Care Tool:  Bathing    Body parts bathed by patient: Right arm, Left arm,  Chest, Abdomen, Front perineal area, Right upper leg, Left upper leg, Face   Body parts bathed by helper: Buttocks, Right lower leg, Left lower leg     Bathing assist Assist Level: Minimal Assistance - Patient > 75%     Upper Body Dressing/Undressing Upper body dressing   What is the patient wearing?: Bra, Pull over shirt    Upper body assist Assist Level: Minimal Assistance - Patient > 75%    Lower Body Dressing/Undressing Lower body dressing      What is the patient wearing?: Underwear/pull up, Orthosis     Lower body assist Assist for lower body dressing: Total Assistance - Patient < 25%     Toileting Toileting Toileting Activity did not occur (Clothing management and hygiene only): N/A (no void or bm)  Toileting assist Assist for toileting: Total Assistance - Patient < 25%     Transfers Chair/bed transfer  Transfers assist     Chair/bed transfer assist level: Maximal Assistance - Patient 25 - 49%     Locomotion Ambulation   Ambulation assist      Assist level: Moderate Assistance - Patient 50 - 74%   Max distance: 4   Walk 10 feet activity   Assist  Walk 10 feet activity did not occur: Safety/medical concerns        Walk 50 feet activity   Assist Walk 50 feet with 2 turns activity did not occur: Safety/medical concerns         Walk 150 feet activity  Assist Walk 150 feet activity did not occur: Safety/medical concerns         Walk 10 feet on uneven surface  activity   Assist Walk 10 feet on uneven surfaces activity did not occur: Safety/medical concerns         Wheelchair     Assist Will patient use wheelchair at discharge?: Yes Type of Wheelchair: Manual    Wheelchair assist level: Contact Guard/Touching assist Max wheelchair distance: 11ft     Wheelchair 50 feet with 2 turns activity    Assist        Assist Level: Contact Guard/Touching assist   Wheelchair 150 feet activity     Assist      Assist Level: Contact Guard/Touching assist      Medical Problem List and Plan:  1. 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 Touchdown weightbearing as well as history of right total hip arthroplasty   Continue CIR 2. DVT Prophylaxis/Anticoagulation: SCDs.   Recent Venous Doppler studies negative  3. Pain Management: Oxycodone as needed   ice to left hip   Consider scheduled pain medication prior to therapies if pain is prohibitive.  4. Mood: Provide emotional support  5. Neuropsych: This patient is capable of making decisions on her own behalf.  6. Skin/Wound Care: Routine skin checks  -maintain current dressing to left hip  7. Fluids/Electrolytes/Nutrition: Routine in and outs   BMP within acceptable range on 10/7 8. Acute blood loss anemia.    Hemoglobin 9.6 on 10/7 9. Hypothyroidism. Synthroid  10. Hyperlipidemia. Zocor  11. Constipation. scheduled bowel meds 12. GERD. Protonix  13. Prediabetes. Hemoglobin A1c 5.8  14. Leukocytosis: Resolved  WBC's 8.2 on 10/7  Afebrile 15.  Transaminitis  LFTs elevated on 10/7  Continue to monitor 16.  Hypoalbuminemia  Supplement initiated on 10/7    LOS: 3 days A FACE TO FACE EVALUATION WAS PERFORMED  Jalissa Heinzelman Lorie Phenix 05/04/2018, 9:16 AM

## 2018-05-04 NOTE — Progress Notes (Signed)
Occupational Therapy Session Note  Patient Details  Name: Emma Lewis MRN: [id number] Date of Birth: 1951/03/30  Today's Date: 05/04/2018 OT Individual Time: 0930-1015 OT Individual Time Calculation (min): 45 min    Short Term Goals: Week 1:  OT Short Term Goal 1 (Week 1): Pt will sit to stand with MAX A of 1 caregiver OT Short Term Goal 2 (Week 1): Pt will transfer with LRAD with MOD A of 1 caregiver to w/c in prep for [facility] transfer OT Short Term Goal 3 (Week 1): Pt will don footwear with min VC and  AE OT Short Term Goal 4 (Week 1): Pt will dress UB with set up  Skilled Therapeutic Interventions/Progress Updates:    Pt received in w/c. She had completed bathing (except for bottom and perineal area) with previous OT. Pt spent quite a bit of time talking about her surgeries and how much pain she has been in.  Initially worked on prep for safe sit to stand with pushing up with B arms to lift hips and then reach for the walker.  Pt was able to come to stand with only min A-mod A and sit with min A 2-3 x with minimal pain.  She could balance on R leg in standing with one hand support on walker to wash her perineal area in standing and pull up her pants over her hips.  To don over her legs she did need full A with L leg as she did have severe pain when trying to pick up her leg in sitting and she could not manage clothing with reacher over thick Knee immobilizer well.  She was then able to don with A of reacher over R leg without help.   Pt scooted back in w/c with extreme L hip pain as she was actively using her hip flexors.  Pain subsided with rest. Educated pt to build up strength and activity tolerance with small L hip shifts, trying to shift hip back and forth to her tolerance with minimal pain while she is in the w/c or the bed.  Repeat demonstration with the pt.   Pt resting in w/c with all needs met.    Therapy Documentation Precautions:  Precautions Precautions: Fall, Posterior  Hip Precaution Booklet Issued: Yes (comment) Precaution Comments: Reviewed posterior hip precautions with patient.   Required Braces or Orthoses: Knee Immobilizer - Left Restrictions Weight Bearing Restrictions: Yes LLE Weight Bearing: Touchdown weight bearing    Pain: Pain Assessment Pain Scale: 0-10 Pain Score: 8  Pain Type: Acute pain Pain Location: Hip Pain Orientation: Left Pain Descriptors / Indicators: Aching Pain Onset: With Activity Pain Intervention(s): Rest;Repositioned ADL:     Therapy/Group: Individual Therapy  [place] 05/04/2018, 11:20 AM

## 2018-05-04 NOTE — Progress Notes (Signed)
Patient information reviewed and entered into eRehab system by Diella Gillingham, RN, CRRN, PPS Coordinator.  Information including medical coding, functional ability and quality indicators will be reviewed and updated through discharge.     Per nursing patient was given "Data Collection Information Summary for Patients in Inpatient Rehabilitation Facilities with attached "Privacy Act Statement-Health Care Records" upon admission.  

## 2018-05-05 ENCOUNTER — Inpatient Hospital Stay (HOSPITAL_COMMUNITY): Payer: Medicare Other | Admitting: Physical Therapy

## 2018-05-05 ENCOUNTER — Inpatient Hospital Stay (HOSPITAL_COMMUNITY): Payer: Medicare Other

## 2018-05-05 ENCOUNTER — Inpatient Hospital Stay (HOSPITAL_COMMUNITY): Payer: Medicare Other | Admitting: Occupational Therapy

## 2018-05-05 NOTE — Progress Notes (Signed)
Physical Therapy Session Note  Patient Details  Name: Emma Lewis MRN: [id number] Date of Birth: [birth year]  Today's Date: 05/05/2018 PT Individual Time: 1000-1055 PT Individual Time Calculation (min): 55 min   Short Term Goals: Week 1:  PT Short Term Goal 1 (Week 1): Pt will perform bed mobility with mod assist consistently  PT Short Term Goal 2 (Week 1): Pt will ambulate 12ft with mod assist and LRAD  PT Short Term Goal 3 (Week 1): Pt will perform bed<>WC transfer with mod assist and LRAD  PT Short Term Goal 4 (Week 1): Pt will propell WC >154ft with supervision assist.   Skilled Therapeutic Interventions/Progress Updates: Pt received in bed, c/o pain as below and agreeable to treatment. Supine>sit with HOB elevated and minA for LLE management. Sit >stand x2 reps from EOB with RW and bed slightly elevated to increase ease of transfer; min guard for transfer. Pt reports bed at home elevates height from floor, head/legs elevate as well to utilize like hospital bed during bed mobility. Also discussed ramp for home entry; pt reports husband already working on a ramp to get in home. Stand pivot to w/c with min guard, RW and RLE scooting across floor as opposed to lifting/stepping. W/c propulsion 2x75' with BUE for strengthening and endurance. Gait trial x5' with RW and min guard, able to clear R foot with cues for technique, however fatigues quickly and reports difficult on shoulders. Seated RLE therex including hip flexion marching, long arc quads 2x15 reps each. AAROM LLE straight leg raise 2x10 reps. Returned to room with totalA for energy conservation; remained seated in w/c at end of session, all needs in reach.      Therapy Documentation Precautions:  Precautions Precautions: Fall, Posterior Hip Precaution Booklet Issued: Yes (comment) Precaution Comments: Reviewed posterior hip precautions with patient.   Required Braces or Orthoses: Knee Immobilizer - Left Restrictions Weight Bearing  Restrictions: Yes LLE Weight Bearing: Touchdown weight bearing Pain: Pain Assessment Pain Scale: 0-10 Pain Score: 5  Pain Type: Surgical pain Pain Location: Hip Pain Orientation: Left Pain Descriptors / Indicators: Aching;Discomfort Pain Frequency: Intermittent Pain Onset: On-going Patients Stated Pain Goal: 0 Pain Intervention(s): Medication (See eMAR);Repositioned Multiple Pain Sites: No    Therapy/Group: Individual Therapy  Corliss Skains 05/05/2018, 11:42 AM

## 2018-05-05 NOTE — Progress Notes (Signed)
[place] PHYSICAL MEDICINE & REHABILITATION PROGRESS NOTE  Subjective/Complaints: Patient seen sitting up in bed this AM.  She slept well overnight for the most part, she notes trouble getting up to bedside commode overnight.  ROS: Denies CP, SOB, nausea, vomiting, diarrhea.  Objective: Vital Signs: Blood pressure (!) 100/56, pulse 77, temperature 98 F (36.7 C), temperature source Oral, resp. rate 16, height 5\' 5"  (1.651 m), weight 86.3 kg, SpO2 97 %. No results found. Recent Labs    05/04/18 0709  WBC 8.2  HGB 9.6*  HCT 30.5*  PLT 219   Recent Labs    05/04/18 0709  NA 137  K 3.9  CL 106  CO2 27  GLUCOSE 99  BUN 10  CREATININE 0.55  CALCIUM 8.6*    Physical Exam: BP (!) 100/56 (BP Location: Left Arm)   Pulse 77   Temp 98 F (36.7 C) (Oral)   Resp 16   Ht 5\' 5"  (1.651 m)   Wt 86.3 kg   SpO2 97%   BMI 31.66 kg/m  Constitutional: She appears well-developed. No distress.  HENT: Normocephalic.  Atraumatic. Eyes: EOMI.  No discharge. Cardiovascular: RRR. No JVD. Respiratory: Effort normal. Clear. GI: Bowel sounds positive.  She exhibits no distension.  Musculoskeletal:  Left hip edema and tenderness Neurological: She is alert and oriented  Follows full commands.  Motor:  RLE 4+/5 prox to distal, stable  LLE 1/5 HF, KE (pain inhibition), 4-/5 ADF, stable Skin: Hip incision with dressing C/D/I  Assessment/Plan: 1. 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  Physiatrist is providing close team supervision and 24 hour management of active medical problems listed below.  Physiatrist and rehab team continue to assess barriers to discharge/monitor patient progress toward functional and medical goals  Care Tool:  Bathing    Body parts bathed by patient: Front perineal area   Body parts  bathed by helper: Buttocks     Bathing assist Assist Level: Minimal Assistance - Patient > 75%     Upper Body Dressing/Undressing Upper body dressing   What is the patient wearing?: Pull over shirt    Upper body assist Assist Level: Set up assist    Lower Body Dressing/Undressing Lower body dressing      What is the patient wearing?: Underwear/pull up, Pants     Lower body assist Assist for lower body dressing: Moderate Assistance - Patient 50 - 74%     Toileting Toileting Toileting Activity did not occur (Clothing management and hygiene only): N/A (no void or bm)  Toileting assist Assist for toileting: Maximal Assistance - Patient 25 - 49%     Transfers Chair/bed transfer  Transfers assist     Chair/bed transfer assist level: 2 Helpers     Locomotion Ambulation   Ambulation assist      Assist level: Moderate Assistance - Patient 50 - 74%   Max distance: 4   Walk 10 feet activity   Assist  Walk 10 feet activity did not occur: Safety/medical concerns        Walk 50 feet activity   Assist Walk 50 feet with 2 turns activity did not occur: Safety/medical concerns         Walk 150 feet activity   Assist Walk 150 feet activity did not occur: Safety/medical concerns         Walk 10 feet on  uneven surface  activity   Assist Walk 10 feet on uneven surfaces activity did not occur: Safety/medical concerns         Wheelchair     Assist Will patient use wheelchair at discharge?: Yes Type of Wheelchair: Manual    Wheelchair assist level: Supervision/Verbal cueing Max wheelchair distance: 100    Wheelchair 50 feet with 2 turns activity    Assist        Assist Level: Supervision/Verbal cueing   Wheelchair 150 feet activity     Assist     Assist Level: Contact Guard/Touching assist      Medical Problem List and Plan:  1. Decreased functional mobility secondary to left total hip arthroplasty metallosis status  post left total hip arthroplasty, posterior approach with osteotomy of femur, cable fixation 04/29/2018. Touchdown weightbearing as well as history of right total hip arthroplasty   Continue CIR 2. DVT Prophylaxis/Anticoagulation: SCDs.   Recent Venous Doppler studies negative  3. Pain Management: Oxycodone as needed   ice to left hip   Consider scheduled pain medication prior to therapies if pain is prohibitive.   Relatively controlled on 10/8 4. Mood: Provide emotional support  5. Neuropsych: This patient is capable of making decisions on her own behalf.  6. Skin/Wound Care: Routine skin checks  7. Fluids/Electrolytes/Nutrition: Routine in and outs   BMP within acceptable range on 10/7 8. Acute blood loss anemia.    Hemoglobin 9.6 on 10/7 9. Hypothyroidism. Synthroid  10. Hyperlipidemia. Zocor  11. Constipation. scheduled bowel meds 12. GERD. Protonix  13. Prediabetes. Hemoglobin A1c 5.8  14. Leukocytosis: Resolved  WBC's 8.2 on 10/7  Afebrile 15.  Transaminitis  LFTs elevated on 10/7  Plan to repeat labs on Thursday  Continue to monitor 16.  Hypoalbuminemia  Supplement initiated on 10/7    LOS: 4 days A FACE TO FACE EVALUATION WAS PERFORMED  Wessie Shanks Lorie Phenix 05/05/2018, 8:26 AM

## 2018-05-05 NOTE — Progress Notes (Addendum)
Physical Therapy Session Note  Patient Details  Name: Emma Lewis MRN: [id number] Date of Birth: 1951-01-05  Today's Date: 05/05/2018 PT Individual Time: 1620-1650 PT Individual Time Calculation (min): 30 min  and Today's Date: 05/05/2018 PT Missed Time: 15 Minutes Missed Time Reason: Pain  Short Term Goals: Week 1:  PT Short Term Goal 1 (Week 1): Pt will perform bed mobility with mod assist consistently  PT Short Term Goal 2 (Week 1): Pt will ambulate 91ft with mod assist and LRAD  PT Short Term Goal 3 (Week 1): Pt will perform bed<>WC transfer with mod assist and LRAD  PT Short Term Goal 4 (Week 1): Pt will propell WC >158ft with supervision assist.   Skilled Therapeutic Interventions/Progress Updates:   Pt in supine and hesitant to participate, states pain in L hip is 8/10 and it is very sore from today's other therapy sessions. Transferred to EOB, min assist, w/ increased time and agreeable to work on sit<>stands from EOB. Performed multiple sit<>stands w/ CGA from elevated surface and verbal cues for safety precautions. Returned to supine and repositioned w/ use of bed features and min assist for both. Applied ice to L lateral hip and heat to L anterior thigh at site of muscle soreness. Ended session in supine, all needs in reach. Missed 15 min of skilled PT 2/2 pain.   Therapy Documentation Precautions:  Precautions Precautions: Fall, Posterior Hip Precaution Booklet Issued: Yes (comment) Precaution Comments: Reviewed posterior hip precautions with patient.   Required Braces or Orthoses: Knee Immobilizer - Left Restrictions Weight Bearing Restrictions: Yes LLE Weight Bearing: Touchdown weight bearing General: PT Amount of Missed Time (min): 15 Minutes PT Missed Treatment Reason: Pain Vital Signs: Therapy Vitals Temp: 98 F (36.7 C) Temp Source: Oral Pulse Rate: 99 Resp: 18 BP: (!) 128/57 Patient Position (if appropriate): Lying Oxygen Therapy SpO2: 97 % O2 Device:  Room Air Pain: Pain Assessment Pain Scale: 0-10 Pain Score: 8  Pain Type: Surgical pain Pain Location: Hip Pain Orientation: Left Pain Descriptors / Indicators: Aching Pain Frequency: Intermittent Pain Onset: On-going Patients Stated Pain Goal: 4 Pain Intervention(s): Repositioned;Heat applied;Cold applied  Therapy/Group: Individual Therapy  Shady Padron Clent Demark 05/05/2018, 4:52 PM

## 2018-05-05 NOTE — Progress Notes (Signed)
Physical Therapy Session Note  Patient Details  Name: Emma Lewis MRN: [id number] Date of Birth: [birth year]  Today's Date: 05/05/2018 PT Individual Time: 0900-0930 PT Individual Time Calculation (min): 30 min   Short Term Goals: Week 1:  PT Short Term Goal 1 (Week 1): Pt will perform bed mobility with mod assist consistently  PT Short Term Goal 2 (Week 1): Pt will ambulate 39ft with mod assist and LRAD  PT Short Term Goal 3 (Week 1): Pt will perform bed<>WC transfer with mod assist and LRAD  PT Short Term Goal 4 (Week 1): Pt will propell WC >136ft with supervision assist.   Skilled Therapeutic Interventions/Progress Updates:    Pt supine in bed upon PT arrival, agreeable to therapy tx and reports pain 6/10 in L hip. Pt reports having to use the bathroom. Pt transferred from supine>sitting EOB with mod assist. Pt performed lateral scoot from bed>commode with min assist and verbal cues for techniques, continent of bladder. Pt transferred back to bed lateral scoot with slideboard and mod assist. Pt performed x 2 sit<>stands this session from elevated EOB with min assist and RW, therapist assisted to pull pants over hips. Pt seated EOB maintained balance with supervision while doffing gown and donning clean shirt. Pt transferred back to supine with min assist for L LE management. Pt left seated in bed with needs in reach and bed alarm set.   Therapy Documentation Precautions:  Precautions Precautions: Fall, Posterior Hip Precaution Booklet Issued: Yes (comment) Precaution Comments: Reviewed posterior hip precautions with patient.   Required Braces or Orthoses: Knee Immobilizer - Left Restrictions Weight Bearing Restrictions: Yes LLE Weight Bearing: Touchdown weight bearing    Therapy/Group: Individual Therapy  Netta Corrigan, PT, DPT 05/05/2018, 7:51 AM

## 2018-05-05 NOTE — Progress Notes (Signed)
Occupational Therapy Session Note  Patient Details  Name: Emma Lewis MRN: [id number] Date of Birth: 12/12/1950  Today's Date: 05/05/2018 OT Individual Time: 1100-1200 OT Individual Time Calculation (min): 60 min    Short Term Goals: Week 1:  OT Short Term Goal 1 (Week 1): Pt will sit to stand with MAX A of 1 caregiver OT Short Term Goal 2 (Week 1): Pt will transfer with LRAD with MOD A of 1 caregiver to w/c in prep for [facility] transfer OT Short Term Goal 3 (Week 1): Pt will don footwear with min VC and  AE OT Short Term Goal 4 (Week 1): Pt will dress UB with set up  Skilled Therapeutic Interventions/Progress Updates:    1:1  Focus on basic stand pivot transfers with RW. Pt continues to prefer to pull up on walker instead of push up from W/c arm rests/ surface she is sitting on. Pt requires extra time to prepare herself before transfer. Able to transition into supine with min A for management of LE. Adjusted KI on top of her clothes instead of adjust her skin for comfort and to avoid rubbing on the skin. Continued practice of straight leg raises and adduction/ abduction slides with toes pointed to the ceiling  to assist with management of LE and bed mobility. In supine perform UE exercises with 3 lb weight bar. Transition back into sitting with min A. PErformed push up from blocks 10x with rest in between to learn how to transition from pushing up from seated position instead of pulling on walker. Pt transferred back into w/c with min guard. Left sitting up for lunch.   Therapy Documentation Precautions:  Precautions Precautions: Fall, Posterior Hip Precaution Booklet Issued: Yes (comment) Precaution Comments: Reviewed posterior hip precautions with patient.   Required Braces or Orthoses: Knee Immobilizer - Left Restrictions Weight Bearing Restrictions: Yes LLE Weight Bearing: Touchdown weight bearing Pain:  ongoing pain in left LE and in left groin; applied heat to groin area and ice to  outer hip. Allowed for rest breaks as needed.  Therapy/Group: Individual Therapy  [name] [facility] 05/05/2018, 2:09 PM

## 2018-05-06 ENCOUNTER — Inpatient Hospital Stay (HOSPITAL_COMMUNITY): Payer: Medicare Other | Admitting: Physical Therapy

## 2018-05-06 ENCOUNTER — Inpatient Hospital Stay (HOSPITAL_COMMUNITY): Payer: Medicare Other | Admitting: Occupational Therapy

## 2018-05-06 ENCOUNTER — Inpatient Hospital Stay (HOSPITAL_COMMUNITY): Payer: Medicare Other

## 2018-05-06 MED ORDER — ACYCLOVIR 400 MG PO TABS
400.0000 mg | ORAL_TABLET | Freq: Two times a day (BID) | ORAL | Status: DC
  Administered 2018-05-06 – 2018-05-15 (×19): 400 mg via ORAL
  Filled 2018-05-06 (×20): qty 1

## 2018-05-06 NOTE — Progress Notes (Signed)
Social Work Patient ID: Emma Lewis, female   DOB: 1951-06-13, 67 y.o.   MRN: [id number]  Met with pt to discuss team conference goals supervision level and target discharge date 10/18. She is doing better today better than yesterday. It is difficult with TDWB but she is getting used to this. Husband working on a ramp for home. Pain limits her at times but she is trying to push through it. Will work on discharge needs.

## 2018-05-06 NOTE — Progress Notes (Addendum)
Occupational Therapy Session Note  Patient Details  Name: Emma Lewis MRN: [id number] Date of Birth: [birth year]  Today's Date: 05/06/2018 OT Individual Time: 0902-1000 OT Individual Time Calculation (min): 58 min    Short Term Goals: Week 1:  OT Short Term Goal 1 (Week 1): Pt will sit to stand with MAX A of 1 caregiver OT Short Term Goal 2 (Week 1): Pt will transfer with LRAD with MOD A of 1 caregiver to w/c in prep for [facility] transfer OT Short Term Goal 3 (Week 1): Pt will don footwear with min VC and  AE OT Short Term Goal 4 (Week 1): Pt will dress UB with set up  Skilled Therapeutic Interventions/Progress Updates:    Pt presents sitting up in recliner agreeable to OT tx session and with minimal reports of pain in LLE. Initial focus of session on functional transfers to/from [facility] over toilet. Pt practicing navigating w/c into bathroom (as she reports having worked on problem solving this earlier today with therapy). Able to perform stand pivot transfer w/c<>BSC over toilet using RW with overall CGA. Exited bathroom and additional focus on practice of AE for completion of LB dressing including use of sock aide and reacher. Pt return demonstrating use of both items with min-mod cues for technique. Pt requiring increased time to perform task as she is easily distracted with talking to therapist and requires redirection to task at hand. Additional focus on sit<>stands and UB strengthening. Pt performing x5 sit<>stands from w/c to RW, performing with overall CGA and good maintenance of TDWB. Pt performed additional UB strengthening exercise using 2lb dowel rod x10 reps across multiple planes. Pt left seated in w/c end of session with call bell and needs within reach.   Therapy Documentation Precautions:  Precautions Precautions: Fall, Posterior Hip Precaution Booklet Issued: Yes (comment) Precaution Comments: Reviewed posterior hip precautions with patient.   Required Braces or Orthoses: Knee  Immobilizer - Left Restrictions Weight Bearing Restrictions: Yes LLE Weight Bearing: Touchdown weight bearing     Therapy/Group: Individual Therapy  Raymondo Band 05/06/2018, 7:38 AM

## 2018-05-06 NOTE — Progress Notes (Signed)
Occupational Therapy Session Note  Patient Details  Name: Emma Lewis MRN: [id number] Date of Birth: May 01, 1951  Today's Date: 05/06/2018 OT Individual Time: 1430-1500 OT Individual Time Calculation (min): 30 min    Short Term Goals: Week 1:  OT Short Term Goal 1 (Week 1): Pt will sit to stand with MAX A of 1 caregiver OT Short Term Goal 2 (Week 1): Pt will transfer with LRAD with MOD A of 1 caregiver to w/c in prep for [facility] 2 transfer OT Short Term Goal 3 (Week 1): Pt will don footwear with min VC and  AE OT Short Term Goal 4 (Week 1): Pt will dress UB with set up  Skilled Therapeutic Interventions/Progress Updates:    Pt seen for OT session focusing on functional mobility and transfers. Pt sitting up in w/c upon arrival, only complaints of "stiffness" from sitting to long in w/c, ready to move with therapy.  She ambulated throughout hospital room with RW and CGA, increased time and steadying required when crossing over bathroom threshold. Completed simulated shower stall transfer to padded tub bench in shower in prep for bathing task tomorrow. Pt able to tolerate L LE placed in dependent position in sitting. Following seated rest break, returned to bed and mod A to return to supine. Pt able to position self in bed with min A using hospital bed functions.  Pt left in supine at end of session, heat applied to groin area 2/2 muscle spasms and ice applied to L hip at surgical site. Bed alarm on, all needs in reach and CSW present.  Education provided throughout session regarding AE, DME, OT/PT goals, and d/c planning.   Therapy Documentation Precautions:  Precautions Precautions: Fall, Posterior Hip Precaution Booklet Issued: Yes (comment) Precaution Comments: Reviewed posterior hip precautions with patient.   Required Braces or Orthoses: Knee Immobilizer - Left Restrictions Weight Bearing Restrictions: Yes LLE Weight Bearing: Touchdown weight bearing   Therapy/Group: Individual  Therapy  Lexx Monte L 05/06/2018, 1:40 PM

## 2018-05-06 NOTE — Progress Notes (Signed)
[facility] PHYSICAL MEDICINE & REHABILITATION PROGRESS NOTE  Subjective/Complaints: Patient seen laying in bed this morning.  She states she slept well overnight.  She notes some improvement in pain.  ROS: Denies CP, SOB, nausea, vomiting, diarrhea.  Objective: Vital Signs: Blood pressure (!) 105/55, pulse 88, temperature 98.6 F (37 C), temperature source Oral, resp. rate 18, height 5\' 5"  (1.651 m), weight 86.3 kg, SpO2 94 %. No results found. Recent Labs    05/04/18 0709  WBC 8.2  HGB 9.6*  HCT 30.5*  PLT 219   Recent Labs    05/04/18 0709  NA 137  K 3.9  CL 106  CO2 27  GLUCOSE 99  BUN 10  CREATININE 0.55  CALCIUM 8.6*    Physical Exam: BP (!) 105/55 (BP Location: Left Arm)   Pulse 88   Temp 98.6 F (37 C) (Oral)   Resp 18   Ht 5\' 5"  (1.651 m)   Wt 86.3 kg   SpO2 94%   BMI 31.66 kg/m  Constitutional: She appears well-developed. No distress.  HENT: Normocephalic.  Atraumatic. Eyes: EOMI.  No discharge. Cardiovascular: RRR.  No JVD. Respiratory: Effort normal.  Clear. GI: Bowel sounds positive.  She exhibits no distension.  Musculoskeletal:  Left hip edema and tenderness Neurological: She is alert and oriented  Follows full commands.  Motor:  RLE 4+/5 prox to distal, unchanged LLE 1+/5 HF, KE (pain inhibition), 4-/5 ADF Skin: Hip incision with dressing C/D/I  Assessment/Plan: 1. 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  Physiatrist is providing close team supervision and 24 hour management of active medical problems listed below.  Physiatrist and rehab team continue to assess barriers to discharge/monitor patient progress toward functional and medical goals  Care Tool:  Bathing    Body parts bathed by patient: Front perineal area   Body parts bathed by helper: Buttocks      Bathing assist Assist Level: Minimal Assistance - Patient > 75%     Upper Body Dressing/Undressing Upper body dressing   What is the patient wearing?: Pull over shirt    Upper body assist Assist Level: Set up assist    Lower Body Dressing/Undressing Lower body dressing      What is the patient wearing?: Underwear/pull up, Pants     Lower body assist Assist for lower body dressing: Moderate Assistance - Patient 50 - 74%     Toileting Toileting Toileting Activity did not occur Landscape architect and hygiene only): N/A (no void or bm)  Toileting assist Assist for toileting: Maximal Assistance - Patient 25 - 49%     Transfers Chair/bed transfer  Transfers assist     Chair/bed transfer assist level: Minimal Assistance - Patient > 75%     Locomotion Ambulation   Ambulation assist      Assist level: Minimal Assistance - Patient > 75% Assistive device: Walker-rolling Max distance: 5   Walk 10 feet activity   Assist  Walk 10 feet activity did not occur: Safety/medical concerns        Walk 50 feet activity   Assist Walk 50 feet with 2 turns activity did not occur: Safety/medical concerns         Walk 150 feet activity   Assist Walk 150 feet activity did not occur: Safety/medical concerns         Walk 10 feet on uneven  surface  activity   Assist Walk 10 feet on uneven surfaces activity did not occur: Safety/medical concerns         Wheelchair     Assist Will patient use wheelchair at discharge?: Yes Type of Wheelchair: Manual    Wheelchair assist level: Supervision/Verbal cueing Max wheelchair distance: 75    Wheelchair 50 feet with 2 turns activity    Assist        Assist Level: Supervision/Verbal cueing   Wheelchair 150 feet activity     Assist     Assist Level: Supervision/Verbal cueing      Medical Problem List and Plan:  1. 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 Touchdown weightbearing as well as history of right total hip arthroplasty   Continue CIR 2. DVT Prophylaxis/Anticoagulation: SCDs.   Recent Venous Doppler studies negative  3. Pain Management: Oxycodone as needed   ice to left hip   Consider scheduled pain medication prior to therapies if pain is prohibitive.   Relatively controlled on 10/8 4. Mood: Provide emotional support  5. Neuropsych: This patient is capable of making decisions on her own behalf.  6. Skin/Wound Care: Routine skin checks  7. Fluids/Electrolytes/Nutrition: Routine in and outs   BMP within acceptable range on 10/7 8. Acute blood loss anemia.    Hemoglobin 9.6 on 10/7 9. Hypothyroidism. Synthroid  10. Hyperlipidemia. Zocor  11. Constipation. scheduled bowel meds 12. GERD. Protonix  13. Prediabetes. Hemoglobin A1c 5.8  14. Leukocytosis: Resolved  WBC's 8.2 on 10/7  Afebrile 15.  Transaminitis  LFTs elevated on 10/7  Labs ordered for tomorrow  Continue to monitor 16.  Hypoalbuminemia  Supplement initiated on 10/7    LOS: 5 days A FACE TO FACE EVALUATION WAS PERFORMED  Ankit Lorie Phenix 05/06/2018, 8:48 AM

## 2018-05-06 NOTE — Progress Notes (Signed)
Physical Therapy Session Note  Patient Details  Name: Emma Lewis MRN: [id number] Date of Birth: 08-30-1950  Today's Date: 05/06/2018 PT Individual Time: 3500-9381 PT Individual Time Calculation (min): 50 min   Short Term Goals: Week 1:  PT Short Term Goal 1 (Week 1): Pt will perform bed mobility with mod assist consistently  PT Short Term Goal 2 (Week 1): Pt will ambulate 17ft with mod assist and LRAD  PT Short Term Goal 3 (Week 1): Pt will perform bed<>WC transfer with mod assist and LRAD  PT Short Term Goal 4 (Week 1): Pt will propell WC >150ft with supervision assist.   Skilled Therapeutic Interventions/Progress Updates:   PT adjusted L KI to be higher on her LE, and bil legrests to support LEs and pelvis.  Pt benefits from LLE being elevatated due to femoral instability after extensive sx.  W/c propulsion on unit with supervision, using bil UEs on level tile.  Seated R long arc quad knee extension with ankle DF at end range with 2# ankle wt, 1 x 15; L hip abduction >< midline with active assistance, 2 x 10. PROM L ankle and foot.  Pt began to have L proximal anterior thigh muscle spasms.  PT applied heat.  RLE quad sets/straight leg raise to 90 degrees,  for drawing alphabet with great toe.   Gait training with RW on level tile x 15' with min guard assist, TDWBing LLE.   Pt left resting in w/c with needs at hand.    Therapy Documentation Precautions:  Precautions Precautions: Fall, Posterior Hip Precaution Booklet Issued: Yes (comment) Precaution Comments: Reviewed posterior hip precautions with patient.   Required Braces or Orthoses: Knee Immobilizer - Left Restrictions Weight Bearing Restrictions: Yes LLE Weight Bearing: Touchdown weight bearing Pain: 2/10 L hip, premedicated     Therapy/Group: Individual Therapy  Samatha Anspach 05/06/2018, 12:22 PM

## 2018-05-06 NOTE — Patient Care Conference (Signed)
Inpatient RehabilitationTeam Conference and Plan of Care Update Date: 05/06/2018   Time: 2:00 PM    Patient Name: Emma Lewis      Medical Record Number: [id number]  Date of Birth: 10/17/1950 Sex: Female         Room/Bed: 4M12C/4M12C-01 Payor Info: Payor: MEDICARE / Plan: MEDICARE PART A AND B / Product Type: *No Product type* /    Admitting Diagnosis: L hip arthroplasty  Admit Date/Time:  05/01/2018  4:33 PM Admission Comments: No comment available   Primary Diagnosis:  <principal problem not specified> Principal Problem: <principal problem not specified>  Patient Active Problem List   Diagnosis Date Noted  . Hypoalbuminemia due to protein-calorie malnutrition ([place])   . Transaminitis   . Leukocytosis   . Postoperative pain   . 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 Acute blood loss anemia   . Leukemoid reaction   . 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 Joint effusion of pelvis or thigh, left 04/22/2018  . Trochanteric bursitis, left hip 04/22/2018  . History of left hip replacement 11/12/2017  . Bilateral primary osteoarthritis of knee 11/12/2017  . GERD (gastroesophageal reflux disease) 01/17/2017  . Hypothyroidism 01/17/2017  . Vertigo 01/16/2017  . Nausea and vomiting 01/16/2017  . Essential hypertension 01/16/2017  . Fatty liver 01/16/2017  . Failed total hip arthroplasty ([place]) 06/10/2016  . Pelvic mass 06/15/2015  . Closed wedge compression fracture of fifth lumbar vertebra ([place])   . Cervical spondylosis 02/09/2014  . DDD (degenerative disc disease), lumbar 05/13/2013  . OA (osteoarthritis) 05/13/2013    Expected Discharge Date: Expected Discharge Date: 05/15/18  Team Members Present: Physician leading conference: Dr. 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 Touchdown weightbearing as well as history of right total hip arthroplasty   Improve mobility, transfers, pain, LFTs  Therapies   Bowel/Bladder   cont b/b; lbm 10/7  maintain  assess q shift and prn   Swallow/Nutrition/ Hydration             ADL's   Min A functional ambulation and transfers; steadying assist dressing using AE. Min A bathing using AE; CGA toileting  Supervision-mod I overall  Functional activity tolerance, functional transfers, AE education and training, ADL re-trainin, d/c planning   Mobility   minA bed mobility, CGA stand pivot transfers with RW, CGA gait with RW ~37ft  planned to upgrade to supervision overall  activity tolerance, transfer training, gait, LE strengthening (needs LLE AAROM SLR before KI can be d/c)   Communication             Safety/Cognition/ Behavioral Observations            Pain   c/o pain to L hip; muscle spasms to R leg  <3  assess q shift and prn; prn pain meds and robaxin   Skin   surgical dressing to L hip  free from infection/breakdown  assess q shift and prn      *See Care Plan and progress notes for long and short-term goals.     Barriers to Discharge  Current Status/Progress Possible Resolutions  Date Resolved   Physician    Medical stability;Weight;Weight bearing restrictions     See above  Therapies, optimize pain meds, follow labs      Nursing                  PT                    OT                  SLP                SW                Discharge Planning/Teaching Needs:  HOme with husband who does work part time, but can make arrangements to be there with her.       Team Discussion:  Goals set for supervision-min assist. Should reach supervision level. Getting used to TDWB  and pushing through the pain. MD monitoring labs for blood loss anemia. Husband building a ramp at home. Currently min-mod level of assist  Revisions to Treatment Plan:  DC 10/18    Continued Need for Acute Rehabilitation Level of Care: The patient requires daily medical management by a physician with specialized training in physical medicine and rehabilitation for the following conditions: Daily direction of a multidisciplinary physical rehabilitation program to ensure safe treatment while eliciting the highest outcome that is of practical value to the patient.: Yes Daily medical management of patient stability for increased activity during participation in an intensive rehabilitation regime.: Yes Daily analysis of laboratory values and/or radiology reports with any subsequent need for medication adjustment of medical intervention for : Post surgical problems;Other   I attest that I was present, lead the team conference, and concur with the assessment and plan of the team.   Elease Hashimoto 05/06/2018, 3:58 PM

## 2018-05-06 NOTE — Progress Notes (Addendum)
Physical Therapy Session Note  Patient Details  Name: Emma Lewis MRN: [id number] Date of Birth: March 21, 1951  Today's Date: 05/06/2018 PT Individual Time: 800-900 PT Individual Time Calculation (min): 60 min   Short Term Goals: Week 1:  PT Short Term Goal 1 (Week 1): Pt will perform bed mobility with mod assist consistently  PT Short Term Goal 2 (Week 1): Pt will ambulate 93ft with mod assist and LRAD  PT Short Term Goal 3 (Week 1): Pt will perform bed<>WC transfer with mod assist and LRAD  PT Short Term Goal 4 (Week 1): Pt will propell WC >183ft with supervision assist.   Skilled Therapeutic Interventions/Progress Updates:    Pt received laying in bed, with reports of cramping/tightness ("charlie horse") in L upper hip flexors/adductors near groin; agreeable to treatment. MinA for dressing/clothing management due to surgical hip precautions and minA for rolling L/R in bed to pull up pants. Pt reminded of posterior hip precautions. Knee immobilizer (KI) donned overtop of pants. Passive pin and stretch technique of L hip flexors and adductors in sidelying with pillow between legs and in supine to address cramping/tightness; palpable trigger points. CGA for long sit>sitting EOB. CGA sit><stand and stand pivot transfers with TDWB LLE, L KI, and RW throughout session (bed>w/c and w/c<>toilet with raised seat). Pt self-propelled w/c into restroom and able to navigate tight space and sharp turn with supervision. Pt completed hygiene independently, but CGA when pulling up pants. CGA ambulation to sink ~110ft with TDWB LLE, L KI< and RW to wash hands. Pt remained in w/c with all needs in reach; denies pain at the end of the session.  Therapy Documentation Precautions:  Precautions Precautions: Fall, Posterior Hip Precaution Booklet Issued: Yes (comment) Precaution Comments: Reviewed posterior hip precautions with patient.   Required Braces or Orthoses: Knee Immobilizer - Left Restrictions Weight  Bearing Restrictions: Yes LLE Weight Bearing: Touchdown weight bearing Vital Signs: Therapy Vitals Temp: 98.6 F (37 C) Temp Source: Oral Pulse Rate: 88 Resp: 18 BP: (!) 105/55 Patient Position (if appropriate): Sitting Oxygen Therapy SpO2: 94 % O2 Device: Room Air Pain: Pain Assessment Pain Scale: 0-10 Pain Score: 5  Pain Type: Surgical pain Pain Location: Hip Pain Orientation: Left Pain Frequency: Intermittent Pain Onset: On-going Pain Intervention(s): Medication (See eMAR) Multiple Pain Sites: No    Therapy/Group: Individual Therapy  [place] Daylan Boggess, SPT 05/06/2018, 9:28 AM

## 2018-05-07 ENCOUNTER — Inpatient Hospital Stay (HOSPITAL_COMMUNITY): Payer: Medicare Other

## 2018-05-07 ENCOUNTER — Inpatient Hospital Stay (HOSPITAL_COMMUNITY): Payer: Medicare Other | Admitting: Physical Therapy

## 2018-05-07 ENCOUNTER — Inpatient Hospital Stay (HOSPITAL_COMMUNITY): Payer: Medicare Other | Admitting: Occupational Therapy

## 2018-05-07 DIAGNOSIS — R29898 Other symptoms and signs involving the musculoskeletal system: Secondary | ICD-10-CM

## 2018-05-07 LAB — COMPREHENSIVE METABOLIC PANEL
ALBUMIN: 2.7 g/dL — AB (ref 3.5–5.0)
ALK PHOS: 204 U/L — AB (ref 38–126)
ALT: 48 U/L — AB (ref 0–44)
ANION GAP: 7 (ref 5–15)
AST: 50 U/L — AB (ref 15–41)
BUN: 14 mg/dL (ref 8–23)
CO2: 26 mmol/L (ref 22–32)
CREATININE: 0.65 mg/dL (ref 0.44–1.00)
Calcium: 9 mg/dL (ref 8.9–10.3)
Chloride: 106 mmol/L (ref 98–111)
GFR calc Af Amer: >60 mL/min (ref >60)
GFR calc non Af Amer: >60 mL/min (ref >60)
GLUCOSE: 108 mg/dL — AB (ref 70–99)
Potassium: 3.9 mmol/L (ref 3.5–5.1)
SODIUM: 139 mmol/L (ref 135–145)
TOTAL PROTEIN: 5.8 g/dL — AB (ref 6.5–8.1)
Total Bilirubin: 0.7 mg/dL (ref 0.3–1.2)

## 2018-05-07 MED ORDER — METHOCARBAMOL 750 MG PO TABS
750.0000 mg | ORAL_TABLET | Freq: Four times a day (QID) | ORAL | Status: DC
  Administered 2018-05-07 – 2018-05-15 (×32): 750 mg via ORAL
  Filled 2018-05-07 (×32): qty 1

## 2018-05-07 NOTE — Progress Notes (Addendum)
Physical Therapy Session Note  Patient Details  Name: Emma Lewis MRN: [id number] Date of Birth: 08-16-1950  Today's Date: 05/07/2018 PT Individual Time: 1450-1535 PT Individual Time Calculation (min): 45 min   Short Term Goals: Week 1:  PT Short Term Goal 1 (Week 1): Pt will perform bed mobility with mod assist consistently  PT Short Term Goal 2 (Week 1): Pt will ambulate 42ft with mod assist and LRAD  PT Short Term Goal 3 (Week 1): Pt will perform bed<>WC transfer with mod assist and LRAD  PT Short Term Goal 4 (Week 1): Pt will propell WC >158ft with supervision assist.   Skilled Therapeutic Interventions/Progress Updates:   Pt verbose, upset about L knee popping and causing her L knee to buckle earlier in day, and exhausting her.  Pt has had L knee Xray today; noted mild L knee degenerative changes.  Pt reported that it has happened in the past, but did not buckle or cause undue pain.  PT provided emotional support and re-directed pt.  R knee strengthening using Kinetron in sitting in w/c, x level 20 cm/sec, x 20 cycles x 2.,excursion within hip precautions. (R knee THA 6/19.)   Gait training on level tile x 12' with RW with  Contact guard assistance.  Pt stopped in pain upon stand> sit due to L anterior thigh muscle spasms.  Pt left resting in w/c with needs at hand.  PT encouraged pt to request heat pack for L anterior thigh, ice pack for L lateral hip.  PT informed Candice, NT of pt's status.     Therapy Documentation Precautions:  Precautions Precautions: Fall, Posterior Hip Precaution Booklet Issued: Yes (comment) Precaution Comments: Reviewed posterior hip precautions with patient.   Required Braces or Orthoses: Knee Immobilizer - Left Restrictions Weight Bearing Restrictions: Yes LLE Weight Bearing: Touchdown weight bearing  Pain: Pain Assessment Pain Score: 2      Therapy/Group: Individual Therapy  Orel Cooler 05/07/2018, 4:23 PM

## 2018-05-07 NOTE — Progress Notes (Cosign Needed)
Patient back from xray at 1110 via wheelchair x1 assist.

## 2018-05-07 NOTE — Plan of Care (Signed)
  Problem: RH Stairs Goal: LTG Patient will ambulate up and down stairs w/assist (PT) Description LTG: Patient will ambulate up and down # of stairs with assistance (PT) Outcome: Not Applicable Flowsheets (Taken 05/07/2018 1601) LTG: Pt will ambulate up/down stairs assist needed:: -- (d/c goal; pt husband installing ramp to home) Note:  d/c goal; pt husband installing ramp to home   Problem: Sit to Stand Goal: LTG:  Patient will perform sit to stand with assistance level (PT) Description LTG:  Patient will perform sit to stand with assistance level (PT) Flowsheets (Taken 05/07/2018 1601) LTG: PT will perform sit to stand in preparation for functional mobility with assistance level: Supervision/Verbal cueing (upgrade due to progress) Note:  upgrade due to progress   Problem: RH Bed Mobility Goal: LTG Patient will perform bed mobility with assist (PT) Description LTG: Patient will perform bed mobility with assistance, with/without cues (PT). Flowsheets (Taken 05/07/2018 1601) LTG: Pt will perform bed mobility with assistance level of: Supervision/Verbal cueing (upgrade due to progress) Note:  upgrade due to progress   Problem: RH Bed to Chair Transfers Goal: LTG Patient will perform bed/chair transfers w/assist (PT) Description LTG: Patient will perform bed to chair transfers with assistance (PT). Flowsheets (Taken 05/07/2018 1601) LTG: Pt will perform Bed to Chair Transfers with assistance level  : Supervision/Verbal cueing (upgrade due to progress) Note:  upgrade due to progress   Problem: RH Ambulation Goal: LTG Patient will ambulate in controlled environment (PT) Description LTG: Patient will ambulate in a controlled environment, # of feet with assistance (PT). Flowsheets (Taken 05/07/2018 1601) LTG: Pt will ambulate in controlled environ  assist needed:: Supervision/Verbal cueing (upgrade due to progress) Note:  upgrade due to progress Goal: LTG Patient will ambulate  in home environment (PT) Description LTG: Patient will ambulate in home environment, # of feet with assistance (PT). Flowsheets (Taken 05/07/2018 1601) LTG: Pt will ambulate in home environ  assist needed:: Supervision/Verbal cueing (upgrade due to progress) Note:  upgrade due to progress

## 2018-05-07 NOTE — Progress Notes (Signed)
Pt going to Xray for L knee. Transported via [facility]. Will follow up when return.  Erie Noe, LPN 68:12 AM

## 2018-05-07 NOTE — Discharge Summary (Signed)
Patient ID: Emma Lewis MRN: [id number] DOB/AGE: Apr 27, 1951 67 y.o.  Admit date: 04/29/2018 Discharge date: 05/07/2018  Admission Diagnoses:  Active Problems:   Failed total hip arthroplasty [facility])   Discharge Diagnoses:  Active Problems:   Failed total hip arthroplasty ([place] [facility])  status post Procedure(s): left total hip arthroplasty revision posterior approach  Past Medical History:  Diagnosis Date  . Arthritis    "all over my body" (04/29/2018)  . Chronic lower back pain   . Fatty liver   . Fever blister    Takes Acyclovir  . GERD (gastroesophageal reflux disease)   . Hyperlipemia   . Hypothyroidism   . Migraines   . Osteopenia   . Pneumonia    "once; years ago" (04/29/2018)  . PONV (postoperative nausea and vomiting)   . Pre-diabetes   . Seasonal allergies   . Vertigo     Surgeries: Procedure(s): left total hip arthroplasty revision posterior approach on 04/29/2018   Consultants:   Discharged Condition: Improved  Hospital Course: Emma Lewis is an 67 y.o. female who was admitted 04/29/2018 for operative treatment of prosthetic loosening. Patient failed conservative treatments (please see the history and physical for the specifics) and had severe unremitting pain that affects sleep, daily activities and work/hobbies. After pre-op clearance, the patient was taken to the operating room on 04/29/2018 and underwent  Procedure(s): left total hip arthroplasty revision posterior approach.    Patient was given perioperative antibiotics:  Anti-infectives (From admission, onward)   Start     Dose/Rate Route Frequency Ordered Stop   04/29/18 2100  ceFAZolin (ANCEF) IVPB 1 g/50 mL premix     1 g 100 mL/hr over 30 Minutes Intravenous Every 8 hours 04/29/18 1740 04/30/18 1352   04/29/18 1000  ceFAZolin (ANCEF) IVPB 2g/100 mL premix     2 g 200 mL/hr over 30 Minutes Intravenous On call to O.R. 04/29/18 0955 04/29/18 1259       Patient was given sequential compression devices  and early ambulation to prevent DVT.   Patient benefited maximally from hospital stay and there were no complications. At the time of discharge, the patient was urinating/moving their bowels without difficulty, tolerating a regular diet, pain is controlled with oral pain medications and they have been cleared by PT/OT.   Recent vital signs: No data found.   Recent laboratory studies:  Recent Labs    05/07/18 0511  NA 139  K 3.9  CL 106  CO2 26  BUN 14  CREATININE 0.65  GLUCOSE 108*  CALCIUM 9.0     Discharge Medications:   Allergies as of 05/01/2018      Reactions   Sulfa Antibiotics Other (See Comments)   UNSPECIFIED REACTION > Childhood allergy Mother said "I liked to have died"   Fenofibrate Rash   She didn't feel right      Medication List    STOP taking these medications   EMERGEN-C VITAMIN C Pack   ibuprofen 200 MG tablet Commonly known as:  ADVIL,MOTRIN   mupirocin ointment 2 % Commonly known as:  BACTROBAN   phentermine 37.5 MG capsule   TURMERIC CURCUMIN PO     TAKE these medications   acyclovir 400 MG tablet Commonly known as:  ZOVIRAX Take 400 mg by mouth 2 (two) times daily.   alendronate 70 MG tablet Commonly known as:  FOSAMAX Take 70 mg by mouth every Sunday.   aspirin 325 MG EC tablet Take 1 tablet (325 mg total) by mouth daily  with breakfast.   Biotin Plus Keratin 10000-100 MCG-MG Tabs Take 1 tablet by mouth 3 (three) times daily.   CALCIUM 600 + D PO Take 1 tablet by mouth 3 (three) times daily.   clobetasol cream 0.05 % Commonly known as:  TEMOVATE Apply 1 application topically 2 (two) times daily. What changed:    when to take this  reasons to take this   levothyroxine 100 MCG tablet Commonly known as:  SYNTHROID, LEVOTHROID Take 100 mcg by mouth daily before breakfast.   meclizine 25 MG tablet Commonly known as:  ANTIVERT Take 1 tablet (25 mg total) by mouth 3 (three) times daily as needed for dizziness. Vertigo. Also  available OTC What changed:    when to take this  additional instructions   nystatin-triamcinolone cream Commonly known as:  MYCOLOG II Apply 1 application topically 2 (two) times daily as needed (irritation).   OMEGA-3 FISH OIL PO Take 1 capsule by mouth 3 (three) times daily. 1,040mg  each   omeprazole 20 MG capsule Commonly known as:  PRILOSEC Take 10 mg by mouth daily.   oxyCODONE-acetaminophen 5-325 MG tablet Commonly known as:  PERCOCET/ROXICET Take 1 tablet by mouth every 6 (six) hours as needed for severe pain.   simvastatin 40 MG tablet Commonly known as:  ZOCOR Take 40 mg by mouth at bedtime.   THERATEARS 0.25 % Soln Generic drug:  Carboxymethylcellulose Sodium Place 1 drop into both eyes 2 (two) times daily as needed (dry eyes).   Vitamin D3 2000 units Tabs Take 2,000 Units by mouth 2 (two) times daily.       Diagnostic Studies: Dg Chest 2 View  Result Date: 04/29/2018 CLINICAL DATA:  Preop hip replacement EXAM: CHEST - 2 VIEW COMPARISON:  01/09/2018 FINDINGS: Linear density in the lingula unchanged. Linear scarring in the right lung base also unchanged. Left lower lobe linear density may represent atelectasis. Heart size and vascularity normal. Negative for heart failure or pneumonia. No effusion. Cervical fusion plate. IMPRESSION: Mild atelectasis/scarring bilateral lung bases. No acute abnormality. Electronically Signed   By: Franchot Gallo M.D.   On: 04/29/2018 10:15   Dg Knee Complete 4 Views Left  Result Date: 05/07/2018 CLINICAL DATA:  Acute LEFT knee pain.  Recent LEFT hip replacement. EXAM: LEFT KNEE - COMPLETE 4+ VIEW COMPARISON:  None. FINDINGS: No acute fracture, subluxation or dislocation. Mild joint space narrowing in the medial and patellofemoral compartments noted. No focal bony lesions noted. No joint effusion. IMPRESSION: Mild MEDIAL and patellofemoral compartment degenerative changes/joint space narrowing without other significant abnormality.  Electronically Signed   By: Margarette Canada M.D.   On: 05/07/2018 12:53   Dg C-arm 1-60 Min  Result Date: 04/29/2018 CLINICAL DATA:  Left hip revision FLUOROSCOPY TIME:  5 seconds. Images: 2 EXAM: OPERATIVE LEFT HIP (WITH PELVIS IF PERFORMED) to VIEWS TECHNIQUE: Fluoroscopic spot image(s) were submitted for interpretation post-operatively. COMPARISON:  None. FINDINGS: The patient is status post left hip revision. Only a portion of the acetabular component is identified but is grossly unremarkable. The superior most aspect of the femoral component is not included on today's study. No evidence of dislocation on AP imaging. Four proximal cerclage wires are identified. The distal most tip of the femoral component is not visualized. IMPRESSION: 1. Left hip revision as above. The superior and inferior most aspects of the hardware are not included on this study. Within visualized limits, no acute abnormalities. Electronically Signed   By: Dorise Bullion III M.D   On: 04/29/2018  15:58   Dg Hip Unilat With Pelvis 1v Left  Result Date: 04/29/2018 CLINICAL DATA:  Postop EXAM: DG HIP (WITH OR WITHOUT PELVIS) 1V*L* COMPARISON:  04/29/2018 FINDINGS: Prior right hip replacement with intact hardware and normal alignment. Interval revision of left hip replacement. Two supra-acetabular fixating screws. Cerclage wires at the proximal femur cortical bone defect with adjacent radiopaque material outer aspect of the femoral stem. Gas in the soft tissues consistent with recent surgery. IMPRESSION: 1. Interval revision of left hip replacement with normal alignment. Focal cortical defect proximal shaft of the femur on the outer side with adjacent radiopaque material. 2. Prior right hip replacement with normal alignment. Electronically Signed   By: Donavan Foil M.D.   On: 04/29/2018 17:40   Dg Hip Operative Unilat W Or W/o Pelvis Left  Result Date: 04/29/2018 CLINICAL DATA:  Left hip revision FLUOROSCOPY TIME:  5 seconds. Images: 2  EXAM: OPERATIVE LEFT HIP (WITH PELVIS IF PERFORMED) to VIEWS TECHNIQUE: Fluoroscopic spot image(s) were submitted for interpretation post-operatively. COMPARISON:  None. FINDINGS: The patient is status post left hip revision. Only a portion of the acetabular component is identified but is grossly unremarkable. The superior most aspect of the femoral component is not included on today's study. No evidence of dislocation on AP imaging. Four proximal cerclage wires are identified. The distal most tip of the femoral component is not visualized. IMPRESSION: 1. Left hip revision as above. The superior and inferior most aspects of the hardware are not included on this study. Within visualized limits, no acute abnormalities. Electronically Signed   By: Dorise Bullion III M.D   On: 04/29/2018 15:58   Xr Hip Unilat W Or W/o Pelvis 2-3 Views Left  Result Date: 04/22/2018 Standing AP pelvis and frog-leg left hip x-rays are obtained and reviewed.  This shows some osteolysis at the calcar as well as laterally above the dome of the acetabular shell.  No obvious loosening of the stem or acetabular cup. Impression: Post total hip arthroplasty revision acetabular component with some osteolysis     Follow-up Information    [name], MD. Schedule an appointment as soon as possible for a visit today.   Specialty:  Orthopedic Surgery Why:  need return office visit 2 weeks postop Contact information: [place] [address] [phone]           Discharge Plan:  discharge to   Disposition:     Signed: Benjiman Core  05/07/2018, 4:08 PM

## 2018-05-07 NOTE — Progress Notes (Signed)
Occupational Therapy Session Note  Patient Details  Name: Emma Lewis MRN: [id number] Date of Birth: 05-Dec-1950  Today's Date: 05/07/2018 OT Individual Time: 2229-7989 OT Individual Time Calculation (min): 75 min    Short Term Goals: Week 1:  OT Short Term Goal 1 (Week 1): Pt will sit to stand with MAX A of 1 caregiver OT Short Term Goal 2 (Week 1): Pt will transfer with LRAD with MOD A of 1 caregiver to w/c in prep for [facility] transfer OT Short Term Goal 3 (Week 1): Pt will don footwear with min VC and  AE OT Short Term Goal 4 (Week 1): Pt will dress UB with set up  Skilled Therapeutic Interventions/Progress Updates:    Pt seen for OT ADL bathing/dressing session. Pt sitting upright in bed upon arrival, finishing breakfast and agreeable to tx session. Discussed pts limitations with pain due to L groin muscle spasms. Currently denying pain, however, talking of pain during the night with bed mobility, education and problem solving assist provided for modified methods to reduce pain. Also spoke with MD during morning Mekhia Brogan regarding use of k-pad as heat in therapy has assisted with pain management. Pt completed functional transfers throughout session with min A, increased time required to power into standing and once in standing due to LE discomfort, RN aware. She ambulated throughout room with RW and CGA. Steadying assist provided during toileting and LB dressing tasks. She bathed seated on tub bench, using LH sponge to reach LEs with min cuing required to maintain hip pre-cautions during functional tasks.  She returned to w/c to dress, requiring increased time. However able to thread B LEs into pants with use of reacher and supervision. Socks donned total A for time management.  Pt left seated in w/c at end of session, plans to go to sink to complete grooming tasks from w/c level mod I, all needs in reach.   Therapy Documentation Precautions:  Precautions Precautions: Fall, Posterior  Hip Precaution Booklet Issued: Yes (comment) Precaution Comments: Reviewed posterior hip precautions with patient.   Required Braces or Orthoses: Knee Immobilizer - Left Restrictions Weight Bearing Restrictions: Yes LLE Weight Bearing: Touchdown weight bearing   Therapy/Group: Individual Therapy  Rukiya Hodgkins L 05/07/2018, 7:05 AM

## 2018-05-07 NOTE — Progress Notes (Addendum)
[facility] PHYSICAL MEDICINE & REHABILITATION PROGRESS NOTE  Subjective/Complaints: Patient seen sitting up in bed this morning working with therapies.  He slept well overnight.  She notes a popping in her left knee, also mentioned by nursing.  She also knows muscle spasms in her left lower extremity.  ROS: +LLE muscle spasms. Denies CP, SOB, nausea, vomiting, diarrhea.  Objective: Vital Signs: Blood pressure (!) 115/58, pulse 88, temperature 97.8 F (36.6 C), temperature source Oral, resp. rate 19, height 5\' 5"  (1.651 m), weight 86.3 kg, SpO2 98 %. No results found. No results for input(s): WBC, HGB, HCT, PLT in the last 72 hours. Recent Labs    05/07/18 0511  NA 139  K 3.9  CL 106  CO2 26  GLUCOSE 108*  BUN 14  CREATININE 0.65  CALCIUM 9.0    Physical Exam: BP (!) 115/58 (BP Location: Left Arm)   Pulse 88   Temp 97.8 F (36.6 C) (Oral)   Resp 19   Ht 5\' 5"  (1.651 m)   Wt 86.3 kg   SpO2 98%   BMI 31.66 kg/m  Constitutional: She appears well-developed. No distress.  HENT: Normocephalic.  Atraumatic. Eyes: EOMI.  No discharge. Cardiovascular: RRR.  No JVD. Respiratory: Effort normal.  Clear. GI: Bowel sounds positive.  She exhibits no distension.  Musculoskeletal:  Left hip edema and tenderness Neurological: She is alert and oriented  Follows full commands.  Motor:  RLE 4+/5 prox to distal, stable LLE 1+/5 HF, KE (pain inhibition), 4/5 ADF Skin: Incision C/D/I  Assessment/Plan: 1. 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  Physiatrist is providing close team supervision and 24 hour management of active medical problems listed below.  Physiatrist and rehab team continue to assess barriers to discharge/monitor patient progress toward functional and medical goals  Care Tool:  Bathing     Body parts bathed by patient: Right arm, Left arm, Chest, Abdomen, Front perineal area, Right upper leg, Left upper leg, Right lower leg, Left lower leg, Face   Body parts bathed by helper: Buttocks     Bathing assist Assist Level: Minimal Assistance - Patient > 75%(LH sponge)     Upper Body Dressing/Undressing Upper body dressing   What is the patient wearing?: Bra, Pull over shirt    Upper body assist Assist Level: Minimal Assistance - Patient > 75%    Lower Body Dressing/Undressing Lower body dressing      What is the patient wearing?: Underwear/pull up, Pants     Lower body assist Assist for lower body dressing: Moderate Assistance - Patient 50 - 74%     Toileting Toileting Toileting Activity did not occur Landscape architect and hygiene only): N/A (no void or bm)  Toileting assist Assist for toileting: Minimal Assistance - Patient > 75%     Transfers Chair/bed transfer  Transfers assist     Chair/bed transfer assist level: Contact Guard/Touching assist     Locomotion Ambulation   Ambulation assist      Assist level: Contact Guard/Touching assist Assistive device: Walker-rolling Max distance: 15   Walk 10 feet activity   Assist  Walk 10 feet activity did not occur: Safety/medical concerns  Assist level: Contact Guard/Touching assist Assistive device: Walker-rolling   Walk 50 feet activity   Assist Walk 50 feet with 2 turns activity did not occur: Safety/medical concerns  Walk 150 feet activity   Assist Walk 150 feet activity did not occur: Safety/medical concerns         Walk 10 feet on uneven surface  activity   Assist Walk 10 feet on uneven surfaces activity did not occur: Safety/medical concerns         Wheelchair     Assist Will patient use wheelchair at discharge?: Yes Type of Wheelchair: Manual    Wheelchair assist level: Supervision/Verbal cueing Max wheelchair distance: 100    Wheelchair 50 feet  with 2 turns activity    Assist        Assist Level: Supervision/Verbal cueing   Wheelchair 150 feet activity     Assist     Assist Level: Supervision/Verbal cueing      Medical Problem List and Plan:  1. 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 Touchdown weightbearing as well as history of right total hip arthroplasty   Continue CIR 2. DVT Prophylaxis/Anticoagulation: SCDs.   Recent Venous Doppler studies negative  3. Pain Management: Oxycodone as needed   ice to left hip   Robaxin increased and scheduled on 10/10  K pad ordered  X-ray ordered for left knee  Relatively controlled on 10/10 4. Mood: Provide emotional support  5. Neuropsych: This patient is capable of making decisions on her own behalf.  6. Skin/Wound Care: Routine skin checks  7. Fluids/Electrolytes/Nutrition: Routine in and outs   BMP within acceptable range on 10/7 8. Acute blood loss anemia.    Hemoglobin 9.6 on 10/7 9. Hypothyroidism. Synthroid  10. Hyperlipidemia. Zocor  11. Constipation. scheduled bowel meds 12. GERD. Protonix  13. Prediabetes. Hemoglobin A1c 5.8  14. Leukocytosis: Resolved  WBC's 8.2 on 10/7  Afebrile 15.  Transaminitis  LFTs elevated on 10/7  Labs ordered for tomorrow  Continue to monitor 16.  Hypoalbuminemia  Supplement initiated on 10/7    LOS: 6 days A FACE TO FACE EVALUATION WAS PERFORMED  Dama Hedgepeth Lorie Phenix 05/07/2018, 10:03 AM

## 2018-05-07 NOTE — Progress Notes (Signed)
Physical Therapy Session Note  Patient Details  Name: Emma Lewis MRN: [id number] Date of Birth: [birth year]  Today's Date: 05/07/2018 PT Individual Time: 0915-1030 PT Individual Time Calculation (min): 75 min   Short Term Goals: Week 1:  PT Short Term Goal 1 (Week 1): Pt will perform bed mobility with mod assist consistently  PT Short Term Goal 2 (Week 1): Pt will ambulate 25ft with mod assist and LRAD  PT Short Term Goal 3 (Week 1): Pt will perform bed<>WC transfer with mod assist and LRAD  PT Short Term Goal 4 (Week 1): Pt will propell WC >166ft with supervision assist.   Skilled Therapeutic Interventions/Progress Updates: Pt received seated in w/c seated at sink completing hygiene with modI. Denies pain at rest.  W/c propulsion BUE x150' with S for strengthening and endurance. Gait x20' with RW and min guard. Sit >supine minA for LLE management after attempting independently but limited by L adductor/groin muscle spasms. RLE straight leg raise 2x10, unable to perform LLE AAROM d/t spasms. Performed AROM external>neutral hip rotation for contract/relax of painful/spasming musculature x15 reps. PROM L hip adduction x2 min. LLE hip adduction to neutral/abduction with maxislide to reduce resistance. Glute sets 2x15 reps; educated in role of glutes for sit >stand, standing, and limited use of L glutes especially d/t TDWB precautions and surgical procedure. 1x10 reps quad sets, hamstring stets. Supine>sit with minA for LLE management. Gait x10 with RW and min guard. Returned to room totalA; remained in w/c at end of session, all needs in reach.      Therapy Documentation Precautions:  Precautions Precautions: Fall, Posterior Hip Precaution Booklet Issued: Yes (comment) Precaution Comments: Reviewed posterior hip precautions with patient.   Required Braces or Orthoses: Knee Immobilizer - Left Restrictions Weight Bearing Restrictions: Yes LLE Weight Bearing: Touchdown weight  bearing    Therapy/Group: Individual Therapy  Corliss Skains 05/07/2018, 10:32 AM

## 2018-05-08 ENCOUNTER — Inpatient Hospital Stay (HOSPITAL_COMMUNITY): Payer: Medicare Other | Admitting: Physical Therapy

## 2018-05-08 ENCOUNTER — Inpatient Hospital Stay (HOSPITAL_COMMUNITY): Payer: Medicare Other | Admitting: Occupational Therapy

## 2018-05-08 DIAGNOSIS — M1711 Unilateral primary osteoarthritis, right knee: Secondary | ICD-10-CM

## 2018-05-08 LAB — GLUCOSE, CAPILLARY: Glucose-Capillary: 110 mg/dL — ABNORMAL HIGH (ref 70–99)

## 2018-05-08 NOTE — Progress Notes (Signed)
Occupational Therapy Session Note  Patient Details  Name: Emma Lewis MRN: [id number] Date of Birth: Dec 12, 1950  Today's Date: 05/08/2018 OT Individual Time: 1430-1530 OT Individual Time Calculation (min): 60 min    Short Term Goals: Week 2:  OT Short Term Goal 1 (Week 2): STG=LTG due to LOS  Skilled Therapeutic Interventions/Progress Updates:    Pt presents sitting up in w/c ready for OT treatment session, mild c/o pain in LLE this session. Pt propels w/c throughout session at supervision level. Pt engaged in standing activity with focus on standing/activity tolerance while maintaining TDWB. Pt able to maintain standing x2 rounds of approx 5 min each during activity with seated rest break throughout and using single UE support. Pt transitioned to dayroom and completed x8 min on [employer] level 5 propelling forwards/backward throughout. Returned to gym and engaged in additional standing activity horseshoes with pt using single UE support and reaching outside BOS to obtain horseshoes. Pt completing task with overall CGA. Pt performing additional seated UB exercise using 2lb dowel rod x10 reps each exercise including chest press, overhead press, forwards/backward rows. Pt returned to room where she was left seated in w/c, call bell and needs within reach.   Therapy Documentation Precautions:  Precautions Precautions: Fall, Posterior Hip Precaution Booklet Issued: Yes (comment) Precaution Comments: Reviewed posterior hip precautions with patient.   Required Braces or Orthoses: Knee Immobilizer - Left Restrictions Weight Bearing Restrictions: Yes LLE Weight Bearing: Touchdown weight bearing     Therapy/Group: Individual Therapy  Raymondo Band 05/08/2018, 4:48 PM

## 2018-05-08 NOTE — Progress Notes (Signed)
Occupational Therapy Weekly Progress Note  Patient Details  Name: Emma Lewis MRN: [id number] Date of Birth: 31-Jul-1950  Beginning of progress report period: May 02, 2018 End of progress report period: May 08, 2018   Patient has met 4 of 4 short term goals.  Pt is making excellent progress towards OT goals. She is completing functional transfers and ambulation with supervision-min A using RW. Bathing/dressing completed with min A using AE to ensure maintaining of hip pre-cautions. She is most limited by pain, specifically in L groin and knee, MD aware. Pt will cont to benefit from skilled OT interventions to cont to increase independence and safety with functional ambulation, transfers and ADL/IADL tasks.   Patient continues to demonstrate the following deficits: acute pain, muscle weakness (generalized) and swelling of limb and therefore will continue to benefit from skilled OT intervention to enhance overall performance with BADL, iADL and Reduce care partner burden.  Patient progressing toward long term goals..  Continue plan of care.  OT Short Term Goals Week 1:  OT Short Term Goal 1 (Week 1): Pt will sit to stand with MAX A of 1 caregiver OT Short Term Goal 1 - Progress (Week 1): Met OT Short Term Goal 2 (Week 1): Pt will transfer with LRAD with MOD A of 1 caregiver to w/c in prep for [facility] transfer OT Short Term Goal 2 - Progress (Week 1): Met OT Short Term Goal 3 (Week 1): Pt will don footwear with min VC and  AE OT Short Term Goal 3 - Progress (Week 1): Met OT Short Term Goal 4 (Week 1): Pt will dress UB with set up OT Short Term Goal 4 - Progress (Week 1): Met Week 2:  OT Short Term Goal 1 (Week 2): STG=LTG due to LOS   Therapy Documentation Precautions:  Precautions Precautions: Fall, Posterior Hip Precaution Booklet Issued: Yes (comment) Precaution Comments: Reviewed posterior hip precautions with patient.   Required Braces or Orthoses: Knee Immobilizer -  Left Restrictions Weight Bearing Restrictions: Yes LLE Weight Bearing: Touchdown weight bearing   Camrie Stock L 05/08/2018, 6:55 AM

## 2018-05-08 NOTE — Progress Notes (Signed)
Physical Therapy Session Note  Patient Details  Name: Emma Lewis MRN: [id number] Date of Birth: Jun 11, 1951  Today's Date: 05/08/2018 PT Individual Time: 5379-4327 PT Individual Time Calculation (min): 73 min   Skilled Therapeutic Interventions/Progress Updates:    Session initiated with pt sitting up in a wheelchair.  Pt propelling chair independently around room.  Session focused on promoting independence for mobility and strength/endurance for D/C home.  Denies pain prior to and after session.  Initial BP: 113/51;  HR: 86 bpm.  Pt propelled w/c for 2 x 150 ft with supervision for UE endurance.  Pt transferred from chair to mat with CGA and RW, required min A for LLE with sit to supine and return.  Pt performed 20 x 5 sec quad set (decreased intensity to stop spasming) in supine.  Pt additionally performed 1 x 5 heel slides into hip abduction. Pt L LE rests in ER so attempted active rotation towards neutral (still unable to reach neutral) to facilitate improved quad/hip flexor engagement in supine.  Pt ambulated 1 x 20 ft with RW and 1 x 10 ft with RW, maintains TDWB well but continues to lack endurance and has spasms in L LE requiring standing rest breaks.  L knee immobilizer in place t/o entirety of session.  Following session, pt left up in room in w/c with ability to access call bell/phone.  Nursing notified of pt location.    Therapy Documentation Precautions:  Precautions Precautions: Fall, Posterior Hip Precaution Booklet Issued: Yes (comment) Precaution Comments: Reviewed posterior hip precautions with patient.   Required Braces or Orthoses: Knee Immobilizer - Left Restrictions Weight Bearing Restrictions: Yes LLE Weight Bearing: Touchdown weight bearing    Therapy/Group: Individual Therapy  Emma Lewis 05/08/2018, 12:33 PM

## 2018-05-08 NOTE — Plan of Care (Signed)
  Problem: Consults Goal: RH GENERAL PATIENT EDUCATION Description See Patient Education module for education specifics. Outcome: Progressing   Problem: RH BOWEL ELIMINATION Goal: RH STG MANAGE BOWEL WITH ASSISTANCE Description STG Manage Bowel with min. Assistance.  Outcome: Progressing Flowsheets (Taken 05/08/2018 1459) STG: Pt will manage bowels with assistance: 6-Modified independent   Problem: RH BLADDER ELIMINATION Goal: RH STG MANAGE BLADDER WITH ASSISTANCE Description STG Manage Bladder With min. Assistance  Outcome: Progressing Flowsheets (Taken 05/08/2018 1459) STG: Pt will manage bladder with assistance: 4-Minimal assistance   Problem: RH SKIN INTEGRITY Goal: RH STG SKIN FREE OF INFECTION/BREAKDOWN Description With mod. assist.  Outcome: Progressing Goal: RH STG MAINTAIN SKIN INTEGRITY WITH ASSISTANCE Description STG Maintain Skin Integrity With min.Assistance.  Outcome: Progressing Flowsheets (Taken 05/08/2018 1459) STG: Maintain skin integrity with assistance: 4-Minimal assistance Goal: RH STG ABLE TO PERFORM INCISION/WOUND CARE W/ASSISTANCE Description STG Able To Perform Incision/Wound Care With min.Assistance.  Outcome: Progressing Flowsheets (Taken 05/08/2018 1459) STG: Pt will be able to perform incision/wound care with assistance: 4-Minimal assistance   Problem: RH SAFETY Goal: RH STG ADHERE TO SAFETY PRECAUTIONS W/ASSISTANCE/DEVICE Description STG Adhere to Safety Precautions With mod.Assistance/Device.  Outcome: Progressing Flowsheets (Taken 05/08/2018 1459) STG:Pt will adhere to safety precautions with assistance/device: 4-Minimal assistance   Problem: RH PAIN MANAGEMENT Goal: RH STG PAIN MANAGED AT OR BELOW PT'S PAIN GOAL Description Less than 3  Outcome: Progressing   Problem: RH KNOWLEDGE DEFICIT GENERAL Goal: RH STG INCREASE KNOWLEDGE OF SELF CARE AFTER HOSPITALIZATION Description Able to verbalized safety precautions before pt. Go  home.  Outcome: Progressing

## 2018-05-08 NOTE — Progress Notes (Signed)
[place] PHYSICAL MEDICINE & REHABILITATION PROGRESS NOTE  Subjective/Complaints: Patient seen sitting up in bed this morning.  She states she slept fairly overnight because she became hot from her K pad.  She notes some improvement with scheduled muscle relaxers.  ROS: Denies CP, SOB, nausea, vomiting, diarrhea.  Objective: Vital Signs: Blood pressure (!) 125/52, pulse 84, temperature 98.2 F (36.8 C), resp. rate 18, height 5\' 5"  (1.651 m), weight 86.3 kg, SpO2 93 %. Dg Knee Complete 4 Views Left  Result Date: 05/07/2018 CLINICAL DATA:  Acute LEFT knee pain.  Recent LEFT hip replacement. EXAM: LEFT KNEE - COMPLETE 4+ VIEW COMPARISON:  None. FINDINGS: No acute fracture, subluxation or dislocation. Mild joint space narrowing in the medial and patellofemoral compartments noted. No focal bony lesions noted. No joint effusion. IMPRESSION: Mild MEDIAL and patellofemoral compartment degenerative changes/joint space narrowing without other significant abnormality. Electronically Signed   By: Margarette Canada M.D.   On: 05/07/2018 12:53   No results for input(s): WBC, HGB, HCT, PLT in the last 72 hours. Recent Labs    05/07/18 0511  NA 139  K 3.9  CL 106  CO2 26  GLUCOSE 108*  BUN 14  CREATININE 0.65  CALCIUM 9.0    Physical Exam: BP (!) 125/52 (BP Location: Left Arm)   Pulse 84   Temp 98.2 F (36.8 C)   Resp 18   Ht 5\' 5"  (1.651 m)   Wt 86.3 kg   SpO2 93%   BMI 31.66 kg/m  Constitutional: She appears well-developed. No distress.  HENT: Normocephalic.  Atraumatic. Eyes: EOMI.  No discharge. Cardiovascular: RRR.  No JVD. Respiratory: Effort normal.  Clear. GI: Bowel sounds positive.  She exhibits no distension.  Musculoskeletal:  Left hip edema and tenderness Neurological: She is alert and oriented  Follows full commands.  Motor:  RLE 4+/5 prox to distal, stable LLE 1+/5 HF, KE (pain inhibition), 4/5 ADF, stable Skin: Incision with dressing C/D/I  Assessment/Plan: 1.  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  Physiatrist is providing close team supervision and 24 hour management of active medical problems listed below.  Physiatrist and rehab team continue to assess barriers to discharge/monitor patient progress toward functional and medical goals  Care Tool:  Bathing    Body parts bathed by patient: Right arm, Left arm, Chest, Abdomen, Front perineal area, Right upper leg, Left upper leg, Right lower leg, Left lower leg, Face   Body parts bathed by helper: Buttocks     Bathing assist Assist Level: Minimal Assistance - Patient > 75%(LH sponge)     Upper Body Dressing/Undressing Upper body dressing   What is the patient wearing?: Bra, Pull over shirt    Upper body assist Assist Level: Minimal Assistance - Patient > 75%    Lower Body Dressing/Undressing Lower body dressing      What is the patient wearing?: Underwear/pull up, Pants     Lower body assist Assist for lower body dressing: Moderate Assistance - Patient 50 - 74%     Toileting Toileting Toileting Activity did not occur (Clothing management and hygiene only): N/A (no void or bm)  Toileting assist Assist for toileting: Minimal Assistance - Patient > 75%     Transfers Chair/bed transfer  Transfers assist     Chair/bed transfer assist level: Contact Guard/Touching assist     Locomotion Ambulation  Ambulation assist      Assist level: Contact Guard/Touching assist Assistive device: Walker-rolling Max distance: 12   Walk 10 feet activity   Assist  Walk 10 feet activity did not occur: Safety/medical concerns  Assist level: Contact Guard/Touching assist Assistive device: Walker-rolling   Walk 50 feet activity   Assist Walk 50 feet with 2 turns activity did not occur: Safety/medical  concerns         Walk 150 feet activity   Assist Walk 150 feet activity did not occur: Safety/medical concerns         Walk 10 feet on uneven surface  activity   Assist Walk 10 feet on uneven surfaces activity did not occur: Safety/medical concerns         Wheelchair     Assist Will patient use wheelchair at discharge?: Yes Type of Wheelchair: Manual    Wheelchair assist level: Supervision/Verbal cueing Max wheelchair distance: 150    Wheelchair 50 feet with 2 turns activity    Assist        Assist Level: Supervision/Verbal cueing   Wheelchair 150 feet activity     Assist     Assist Level: Supervision/Verbal cueing      Medical Problem List and Plan:  1. 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 Touchdown weightbearing as well as history of right total hip arthroplasty   Continue CIR 2. DVT Prophylaxis/Anticoagulation: SCDs.   Recent Venous Doppler studies negative  3. Pain Management: Oxycodone as needed   ice to left hip   Robaxin increased and scheduled on 10/10  K pad ordered  X-ray ordered for left knee  Improving on 10/11 4. Mood: Provide emotional support  5. Neuropsych: This patient is capable of making decisions on her own behalf.  6. Skin/Wound Care: Routine skin checks  7. Fluids/Electrolytes/Nutrition: Routine in and outs   BMP within acceptable range on 10/10 8. Acute blood loss anemia.    Hemoglobin 9.6 on 10/7  Labs ordered for Monday 9. Hypothyroidism. Synthroid  10. Hyperlipidemia. Zocor  11. Constipation. scheduled bowel meds 12. GERD. Protonix  13. Prediabetes. Hemoglobin A1c 5.8  14. Leukocytosis: Resolved  WBC's 8.2 on 10/7  Afebrile 15.  Transaminitis  LFTs elevated on 10/10, slightly improved  Continue to monitor 16.  Hypoalbuminemia  Supplement  initiated on 10/7 17.  Right knee OA  X-ray reviewed, showing medial compartment OA    LOS: 7 days A FACE TO FACE EVALUATION WAS PERFORMED  Gibran Veselka Lorie Phenix 05/08/2018, 8:46 AM

## 2018-05-08 NOTE — Progress Notes (Signed)
Physical Therapy Weekly Progress Note  Patient Details  Name: Emma Lewis MRN: [id number] Date of Birth: 1951-01-03  Beginning of progress report period: May 02, 2018 End of progress report period: May 08, 2018  Today's Date: 05/08/2018 PT Individual Time: 0830-0930 PT Individual Time Calculation (min): 60 min   Patient has met 4 of 4 short term goals.  Pt able to perform bed mobility with minA, transfer bed >< w/c via stand pivot with CGA and RW, ambulate 15-69f with CGA and RW, and propel w/c 1531fwith supervision.  Pt able to complete all tasks while following precautions: L posterior hip precautions and TDWB LLE with KI at all times.  Patient continues to demonstrate the following deficits muscle weakness, decreased cardiorespiratoy endurance and decreased standing balance and decreased postural control and therefore will continue to benefit from skilled PT intervention to increase functional independence with mobility.  Patient progressing toward long term goals..  Plan of care revisions: upgraded to supervision overall.  PT Short Term Goals Week 1:  PT Short Term Goal 1 (Week 1): Pt will perform bed mobility with mod assist consistently  PT Short Term Goal 1 - Progress (Week 1): Met PT Short Term Goal 2 (Week 1): Pt will ambulate 1010fith mod assist and LRAD  PT Short Term Goal 2 - Progress (Week 1): Met PT Short Term Goal 3 (Week 1): Pt will perform bed<>WC transfer with mod assist and LRAD  PT Short Term Goal 3 - Progress (Week 1): Met PT Short Term Goal 4 (Week 1): Pt will propell WC >150f21fth supervision assist.  PT Short Term Goal 4 - Progress (Week 1): Met Week 2:  PT Short Term Goal 1 (Week 2): = LTG due to LOS  Skilled Therapeutic Interventions/Progress Updates:    Pt received laying in bed with complaints of anterior thigh cramping and L lateral knee pain; agreeable to treatment. Pt performed bed mobility with bedrails and minA for doffing/donning clothes;  used grabber to pull up undergarments and pants to maintain hip precautions (SPT reapplied KI after pt fully dressed). CGA stand pivot transfer to/from w/c for all transfers (w/c><bed and w/c><bedside commode over toilet) with RW and TBDW through LLE with KI throughout session. Pt performed hygiene with supervision. Pt propelled from bathroom to sink to wash hands and was able to navigate smaller space with supervision. Passive pin and stretch technique of L hip flexors and adductors in supine to address cramping/tightness and ease pain; palpable trigger points. Pt remained seated in w/c at the end of the session, with reports of decreased tightness in L hip adductors/flexors, and all needs in reach.  Therapy Documentation Precautions:  Precautions Precautions: Fall, Posterior Hip Precaution Booklet Issued: Yes (comment) Precaution Comments: Reviewed posterior hip precautions with patient.   Required Braces or Orthoses: Knee Immobilizer - Left Restrictions Weight Bearing Restrictions: Yes LLE Weight Bearing: Touchdown weight bearing   Therapy/Group: Individual Therapy  JordMartiniquee 05/08/2018, 7:46 AM

## 2018-05-09 ENCOUNTER — Inpatient Hospital Stay (HOSPITAL_COMMUNITY): Payer: Medicare Other

## 2018-05-09 DIAGNOSIS — M1712 Unilateral primary osteoarthritis, left knee: Secondary | ICD-10-CM

## 2018-05-09 MED ORDER — DICLOFENAC SODIUM 1 % TD GEL
2.0000 g | Freq: Four times a day (QID) | TRANSDERMAL | Status: DC
  Administered 2018-05-09 – 2018-05-15 (×8): 2 g via TOPICAL
  Filled 2018-05-09: qty 100

## 2018-05-09 NOTE — Progress Notes (Signed)
Physical Therapy Session Note  Patient Details  Name: Emma Lewis MRN: [id number] Date of Birth: [birth year]  Today's Date: 05/09/2018 PT Individual Time: 0900-1000 PT Individual Time Calculation (min): 60 min   Short Term Goals: Week 2:  PT Short Term Goal 1 (Week 2): = LTG due to LOS  Skilled Therapeutic Interventions/Progress Updates:    Pt supine in bed upon PT arrival, agreeable to therapy tx and reports pain 5/10 in L LE. Pt reports increased pain last night when getting OOB to use the commode and the staff "dropped" her L LE suddenly, therapist provided emotional support. Pt transferred to EOB with min assist and performed sit<>stand with RW and CGA. Pt ambulated from bed>toilet x 10 ft with RW and CGA. Continent of bowel and bladder, pt performed pericare with supervision. Pt ambulated back to bed x 10 ft with RW and CGA. While sitting EOB pt performed lower body dressing with use of reacher to loop LEs through pants, performed upper body dressing with assist to hook bra and supervision to don shirt. Pt performed sit<>stand with CGA and pulled pants over hips with supervision. Pt performed stand pivot to w/c with RW and supervision. Pt propelled w/c to sink with supervision and brushed teeth.  Pt propelled w/c from room>gym with supervision. Therapist performed manual knee flexion/extension ROM of L LE. Pt performed 2 x 10 SAQ with L LE for strengthening. Pt performed x 10 modified SLR from sitting in w/c, able to perform first 10 degrees from floor without assist. Pt transported back to room and left seated with needs in reach.   Therapy Documentation Precautions:  Precautions Precautions: Fall, Posterior Hip Precaution Booklet Issued: Yes (comment) Precaution Comments: Reviewed posterior hip precautions with patient.   Required Braces or Orthoses: Knee Immobilizer - Left Restrictions Weight Bearing Restrictions: Yes LLE Weight Bearing: Touchdown weight bearing    Therapy/Group:  Individual Therapy  Netta Corrigan, PT, DPT 05/09/2018, 7:51 AM

## 2018-05-09 NOTE — Progress Notes (Signed)
[place] PHYSICAL MEDICINE & REHABILITATION PROGRESS NOTE  Subjective/Complaints:   No issues overnite except a staff member moved her LLE to quickly while repositioning in bed, caused pain  ROS: Denies CP, SOB, nausea, vomiting, diarrhea.  Objective: Vital Signs: Blood pressure 102/62, pulse 76, temperature 97.9 F (36.6 C), resp. rate 16, height 5\' 5"  (1.651 m), weight 86.3 kg, SpO2 97 %. Dg Knee Complete 4 Views Left  Result Date: 05/07/2018 CLINICAL DATA:  Acute LEFT knee pain.  Recent LEFT hip replacement. EXAM: LEFT KNEE - COMPLETE 4+ VIEW COMPARISON:  None. FINDINGS: No acute fracture, subluxation or dislocation. Mild joint space narrowing in the medial and patellofemoral compartments noted. No focal bony lesions noted. No joint effusion. IMPRESSION: Mild MEDIAL and patellofemoral compartment degenerative changes/joint space narrowing without other significant abnormality. Electronically Signed   By: Margarette Canada M.D.   On: 05/07/2018 12:53   No results for input(s): WBC, HGB, HCT, PLT in the last 72 hours. Recent Labs    05/07/18 0511  NA 139  K 3.9  CL 106  CO2 26  GLUCOSE 108*  BUN 14  CREATININE 0.65  CALCIUM 9.0    Physical Exam: BP 102/62 (BP Location: Right Arm)   Pulse 76   Temp 97.9 F (36.6 C)   Resp 16   Ht 5\' 5"  (1.651 m)   Wt 86.3 kg   SpO2 97%   BMI 31.66 kg/m  Constitutional: She appears well-developed. No distress.  HENT: Normocephalic.  Atraumatic. Eyes: EOMI.  No discharge. Cardiovascular: RRR.  No JVD. Respiratory: Effort normal.  Clear. GI: Bowel sounds positive.  She exhibits no distension.  Musculoskeletal: no left knee effusion or erythema mild lateral > medial jt line tenderness ,no pain over patellar or quad tendon Left hip edema and tenderness Neurological: She is alert and oriented  Follows full commands.  Motor:  RLE 4+/5 prox to distal, stable LLE 1+/5 HF, KE (pain inhibition), 4/5 ADF, stable Skin: Incision with dressing  C/D/I  Assessment/Plan: 1. 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  Physiatrist is providing close team supervision and 24 hour management of active medical problems listed below.  Physiatrist and rehab team continue to assess barriers to discharge/monitor patient progress toward functional and medical goals  Care Tool:  Bathing    Body parts bathed by patient: Right arm, Left arm, Chest, Abdomen, Front perineal area, Right upper leg, Left upper leg, Right lower leg, Left lower leg, Face   Body parts bathed by helper: Buttocks     Bathing assist Assist Level: Minimal Assistance - Patient > 75%     Upper Body Dressing/Undressing Upper body dressing   What is the patient wearing?: Bra, Pull over shirt    Upper body assist Assist Level: Minimal Assistance - Patient > 75%    Lower Body Dressing/Undressing Lower body dressing      What is the patient wearing?: Underwear/pull up, Pants     Lower body assist Assist for lower body dressing: Moderate Assistance - Patient 50 - 74%     Toileting Toileting Toileting Activity did not occur (Clothing management and hygiene only): N/A (no void or bm)  Toileting assist Assist for toileting: Minimal Assistance - Patient > 75%     Transfers Chair/bed transfer  Transfers assist     Chair/bed transfer assist level: Contact Guard/Touching assist  Locomotion Ambulation   Ambulation assist      Assist level: Contact Guard/Touching assist Assistive device: Walker-rolling Max distance: (20 ft)   Walk 10 feet activity   Assist  Walk 10 feet activity did not occur: Safety/medical concerns  Assist level: Contact Guard/Touching assist Assistive device: Walker-rolling   Walk 50 feet activity   Assist Walk 50 feet with 2 turns activity did not  occur: Safety/medical concerns         Walk 150 feet activity   Assist Walk 150 feet activity did not occur: Safety/medical concerns         Walk 10 feet on uneven surface  activity   Assist Walk 10 feet on uneven surfaces activity did not occur: Safety/medical concerns         Wheelchair     Assist Will patient use wheelchair at discharge?: Yes Type of Wheelchair: Manual    Wheelchair assist level: Supervision/Verbal cueing Max wheelchair distance: (150 ft)    Wheelchair 50 feet with 2 turns activity    Assist        Assist Level: Supervision/Verbal cueing   Wheelchair 150 feet activity     Assist     Assist Level: Supervision/Verbal cueing      Medical Problem List and Plan:  1. 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 Touchdown weightbearing as well as history of right total hip arthroplasty   Continue CIR PT, OT2. DVT Prophylaxis/Anticoagulation: SCDs.   Recent Venous Doppler studies negative  3. Pain Management: Oxycodone as needed   ice to left hip   Robaxin increased and scheduled on 10/10  K pad ordered  X-rayleft knee mild DJD  4. Mood: Provide emotional support  5. Neuropsych: This patient is capable of making decisions on her own behalf.  6. Skin/Wound Care: Routine skin checks  7. Fluids/Electrolytes/Nutrition: Routine in and outs   BMP within acceptable range on 10/10 8. Acute blood loss anemia.    Hemoglobin 9.6 on 10/7  Labs ordered for Monday 9. Hypothyroidism. Synthroid  10. Hyperlipidemia. Zocor  11. Constipation. scheduled bowel meds 12. GERD. Protonix  13. Prediabetes. Hemoglobin A1c 5.8  14. Leukocytosis: Resolved  WBC's 8.2 on 10/7  Afebrile 15.  Transaminitis  LFTs elevated on 10/10, slightly improved  Continue to monitor 16.  Hypoalbuminemia  Supplement  initiated on 10/7 17.  LEFT knee OA  X-ray reviewed, showing medial compartment OA- add diclofenac    LOS: 8 days A FACE TO FACE EVALUATION WAS PERFORMED  Charlett Blake 05/09/2018, 8:49 AM

## 2018-05-10 ENCOUNTER — Inpatient Hospital Stay (HOSPITAL_COMMUNITY): Payer: Medicare Other | Admitting: Physical Therapy

## 2018-05-10 NOTE — Progress Notes (Signed)
Physical Therapy Session Note  Patient Details  Name: Emma Lewis MRN: [id number] Date of Birth: Feb 26, 1951  Today's Date: 05/10/2018 PT Individual Time: 0900-1000 PT Individual Time Calculation (min): 60 min   Skilled Therapeutic Interventions/Progress Updates:    Pt received sitting in w/c; agreeable to PT services. Pt able to propel w/c around room and brush teeth with S. TotalA w/c transport to hallway for energy/time conservation. Sit>stand with RW and supervision. Pt ambulated ~6ft with RW, TDWB LLE with KI, and CGA. Today's session focused on strengthening of the LLE: standing hip flexion, standing hip abduction, supine SAQ with 1 pillow, active assisted SLR to ~20 degrees, and glute sets. CGA stand pivot mat>w/c transfer. Supervision w/c propulsion ~150' for return to room. Pt remained sitting in w/c at the end of the session with all needs in reach.   Therapy Documentation Precautions:  Precautions Precautions: Fall, Posterior Hip Precaution Booklet Issued: Yes (comment) Precaution Comments: Reviewed posterior hip precautions with patient.   Required Braces or Orthoses: Knee Immobilizer - Left Restrictions Weight Bearing Restrictions: Yes LLE Weight Bearing: Touchdown weight bearing Pain: Pain Assessment Pain Scale: 0-10 Pain Score: 0-No pain Pain Type: Chronic pain Pain Location: Knee Pain Orientation: Left Pain Descriptors / Indicators: Aching Pain Frequency: Occasional Pain Onset: On-going Pain Intervention(s): Medication (See eMAR)    Therapy/Group: Individual Therapy  [place] Destyni Hoppel 05/10/2018, 10:54 AM

## 2018-05-10 NOTE — Progress Notes (Signed)
[place] PHYSICAL MEDICINE & REHABILITATION PROGRESS NOTE  Subjective/Complaints:   Constipation improved with fruit plate qam  ROS: Denies CP, SOB, nausea, vomiting, diarrhea.  Objective: Vital Signs: Blood pressure 100/60, pulse 79, temperature 98 F (36.7 C), resp. rate 16, height 5\' 5"  (1.651 m), weight 86.3 kg, SpO2 94 %. No results found. No results for input(s): WBC, HGB, HCT, PLT in the last 72 hours. No results for input(s): NA, K, CL, CO2, GLUCOSE, BUN, CREATININE, CALCIUM in the last 72 hours.  Physical Exam: BP 100/60 (BP Location: Left Arm)   Pulse 79   Temp 98 F (36.7 C)   Resp 16   Ht 5\' 5"  (1.651 m)   Wt 86.3 kg   SpO2 94%   BMI 31.66 kg/m  Constitutional: She appears well-developed. No distress.  HENT: Normocephalic.  Atraumatic. Eyes: EOMI.  No discharge. Cardiovascular: RRR.  No JVD. Respiratory: Effort normal.  Clear. GI: Bowel sounds positive.  She exhibits no distension.  Musculoskeletal: no left knee effusion or erythema mild lateral > medial jt line tenderness ,no pain over patellar or quad tendon Left hip edema and tenderness Neurological: She is alert and oriented  Follows full commands.  Motor:  RLE 4+/5 prox to distal, stable LLE 1+/5 HF, KE (pain inhibition), 4/5 ADF, stable Skin: Incision with dressing C/D/I  Assessment/Plan: 1. 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  Physiatrist is providing close team supervision and 24 hour management of active medical problems listed below.  Physiatrist and rehab team continue to assess barriers to discharge/monitor patient progress toward functional and medical goals  Care Tool:  Bathing    Body parts bathed by patient: Right arm, Left arm, Chest, Abdomen, Front perineal area, Right upper leg, Left upper leg, Right  lower leg, Left lower leg, Face   Body parts bathed by helper: Buttocks     Bathing assist Assist Level: Minimal Assistance - Patient > 75%     Upper Body Dressing/Undressing Upper body dressing   What is the patient wearing?: Bra, Pull over shirt    Upper body assist Assist Level: Minimal Assistance - Patient > 75%    Lower Body Dressing/Undressing Lower body dressing      What is the patient wearing?: Underwear/pull up     Lower body assist Assist for lower body dressing: Moderate Assistance - Patient 50 - 74%     Toileting Toileting Toileting Activity did not occur (Clothing management and hygiene only): N/A (no void or bm)  Toileting assist Assist for toileting: Supervision/Verbal cueing     Transfers Chair/bed transfer  Transfers assist     Chair/bed transfer assist level: Moderate Assistance - Patient 50 - 74%     Locomotion Ambulation   Ambulation assist      Assist level: Contact Guard/Touching assist Assistive device: Walker-rolling Max distance: 10 ft   Walk 10 feet activity   Assist  Walk 10 feet activity did not occur: Safety/medical concerns  Assist level: Contact Guard/Touching assist Assistive device: Walker-rolling   Walk 50 feet activity   Assist Walk 50 feet with 2 turns activity did not occur: Safety/medical concerns         Walk 150 feet activity   Assist Walk 150 feet activity did not occur: Safety/medical concerns         Walk 10 feet on uneven surface  activity  Assist Walk 10 feet on uneven surfaces activity did not occur: Safety/medical concerns         Wheelchair     Assist Will patient use wheelchair at discharge?: Yes Type of Wheelchair: Manual    Wheelchair assist level: Supervision/Verbal cueing Max wheelchair distance: (150 ft)    Wheelchair 50 feet with 2 turns activity    Assist        Assist Level: Supervision/Verbal cueing   Wheelchair 150 feet activity     Assist      Assist Level: Supervision/Verbal cueing      Medical Problem List and Plan:  1. 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 Touchdown weightbearing as well as history of right total hip arthroplasty   Continue CIR PT, OT2. DVT Prophylaxis/Anticoagulation: SCDs.   Recent Venous Doppler studies negative  3. Pain Management: Oxycodone as needed   ice to left hip   Robaxin increased and scheduled on 10/10  K pad ordered  X-rayleft knee mild DJD  4. Mood: Provide emotional support  5. Neuropsych: This patient is capable of making decisions on her own behalf.  6. Skin/Wound Care: Routine skin checks  7. Fluids/Electrolytes/Nutrition: Routine in and outs   BMP within acceptable range on 10/10 8. Acute blood loss anemia.    Hemoglobin 9.6 on 10/7  Labs ordered for Monday 9. Hypothyroidism. Synthroid  10. Hyperlipidemia. Zocor  11. Constipation. scheduled bowel meds senna qhs, colace TID 12. GERD. Protonix  13. Prediabetes. Hemoglobin A1c 5.8  14. Leukocytosis: Resolved  WBC's 8.2 on 10/7  Afebrile 15.  Transaminitis  LFTs elevated on 10/10, slightly improved  Continue to monitor 16.  Hypoalbuminemia  Supplement initiated on 10/7 17.  LEFT knee OA  X-ray reviewed, showing medial compartment OA- add diclofenac    LOS: 9 days A FACE TO FACE EVALUATION WAS PERFORMED  Charlett Blake 05/10/2018, 7:46 AM

## 2018-05-11 ENCOUNTER — Inpatient Hospital Stay (HOSPITAL_COMMUNITY): Payer: Medicare Other | Admitting: Occupational Therapy

## 2018-05-11 ENCOUNTER — Inpatient Hospital Stay (HOSPITAL_COMMUNITY): Payer: Medicare Other | Admitting: Physical Therapy

## 2018-05-11 DIAGNOSIS — R7303 Prediabetes: Secondary | ICD-10-CM

## 2018-05-11 LAB — CBC WITH DIFFERENTIAL/PLATELET
Abs Immature Granulocytes: 0.02 10*3/uL (ref 0.00–0.07)
Basophils Absolute: 0 10*3/uL (ref 0.0–0.1)
Basophils Relative: 0 %
EOS ABS: 0.4 10*3/uL (ref 0.0–0.5)
Eosinophils Relative: 6 %
HEMATOCRIT: 34.9 % — AB (ref 36.0–46.0)
Hemoglobin: 10.4 g/dL — ABNORMAL LOW (ref 12.0–15.0)
IMMATURE GRANULOCYTES: 0 %
LYMPHS ABS: 2.1 10*3/uL (ref 0.7–4.0)
LYMPHS PCT: 31 %
MCH: 29 pg (ref 26.0–34.0)
MCHC: 29.8 g/dL — ABNORMAL LOW (ref 30.0–36.0)
MCV: 97.2 fL (ref 80.0–100.0)
Monocytes Absolute: 0.4 10*3/uL (ref 0.1–1.0)
Monocytes Relative: 6 %
NRBC: 0 % (ref 0.0–0.2)
Neutro Abs: 3.8 10*3/uL (ref 1.7–7.7)
Neutrophils Relative %: 57 %
Platelets: 317 10*3/uL (ref 150–400)
RBC: 3.59 MIL/uL — AB (ref 3.87–5.11)
RDW: 15.6 % — AB (ref 11.5–15.5)
WBC: 6.7 10*3/uL (ref 4.0–10.5)

## 2018-05-11 LAB — BASIC METABOLIC PANEL
ANION GAP: 9 (ref 5–15)
BUN: 14 mg/dL (ref 8–23)
CALCIUM: 9.3 mg/dL (ref 8.9–10.3)
CO2: 26 mmol/L (ref 22–32)
CREATININE: 0.72 mg/dL (ref 0.44–1.00)
Chloride: 103 mmol/L (ref 98–111)
Glucose, Bld: 164 mg/dL — ABNORMAL HIGH (ref 70–99)
Potassium: 4 mmol/L (ref 3.5–5.1)
SODIUM: 138 mmol/L (ref 135–145)

## 2018-05-11 NOTE — Progress Notes (Signed)
Occupational Therapy Session Note  Patient Details  Name: Emma Lewis MRN: [id number] Date of Birth: 11-05-1950  Today's Date: 05/11/2018 OT Individual Time: 1300-1400 OT Individual Time Calculation (min): 60 min    Short Term Goals: Week 2:  OT Short Term Goal 1 (Week 2): STG=LTG due to LOS  Skilled Therapeutic Interventions/Progress Updates:    Pt sitting in w/c upon entry with reports of 10/10 pain in LLE following mobility- RN distributed meds during session. Pt performed w/c mobility using BUE's to ADL kitchen with supervision. Pt performed functional transfers throughout session with supervision-CGA needed at times 2/2 to pain and fatigue. Pt ambulated throughout kitchen with RW and performed various kitchen mobility tasks including locating, transporting, and putting away items with close supervision for safety throughout. Pt required seated and standing rest breaks throughout due to poor activity tolerance and pt reporting that the pain in LLE "comes and goes." Pt educated on energy conservation strategies, pacing throughout day, and RW safety throughout session with verbal comprehension from pt.   Pt then transferred to recliner  (pt reporting she will sleep in recliner at home PRN) and required prolonged rest break prior to transferring out of chair with CGA. Pt then transported back to room as mentioned above and left seated in w/c with all needs in reach and chair alarm activated.   Therapy Documentation Precautions:  Precautions Precautions: Fall, Posterior Hip Precaution Booklet Issued: Yes (comment) Precaution Comments: Reviewed posterior hip precautions with patient.   Required Braces or Orthoses: Knee Immobilizer - Left Restrictions Weight Bearing Restrictions: Yes LLE Weight Bearing: Touchdown weight bearing   Therapy/Group: Individual Therapy  Ava Deguire 05/11/2018, 3:11 PM

## 2018-05-11 NOTE — Progress Notes (Signed)
Occupational Therapy Session Note  Patient Details  Name: Emma Lewis MRN: [id number] Date of Birth: Sep 18, 1950  Today's Date: 05/11/2018 OT Individual Time: 2182-8833 OT Individual Time Calculation (min): 35 min   Short Term Goals: Week 2:  OT Short Term Goal 1 (Week 2): STG=LTG due to LOS  Skilled Therapeutic Interventions/Progress Updates:    Pt greeted in w/c with c/o soreness in pectoral muscles and shoulders. ADL needs met. Guided pt through gentle yoga stretches for neck, chest, shoulders, UEs, and back to relieve UB tension. Education emphasis placed on coordinating breath with movement and moving in pain-free positions. At end of session pt was left in w/c with all needs within reach.    Therapy Documentation Precautions:  Precautions Precautions: Fall, Posterior Hip Precaution Booklet Issued: Yes (comment) Precaution Comments: Reviewed posterior hip precautions with patient.   Required Braces or Orthoses: Knee Immobilizer - Left Restrictions Weight Bearing Restrictions: Yes LLE Weight Bearing: Touchdown weight bearing Pain: Pt reported pain to be manageable with rest breaks during session  Pain Assessment Pain Scale: 0-10 Pain Score: 10-Worst pain ever Pain Type: Chronic pain Pain Location: Leg Pain Orientation: Left Pain Intervention(s): Medication (See eMAR) ADL:       Therapy/Group: Individual Therapy  Shereen Marton A Kelton Bultman 05/11/2018, 4:18 PM

## 2018-05-11 NOTE — Progress Notes (Signed)
Physical Therapy Session Note  Patient Details  Name: Emma Lewis MRN: [id number] Date of Birth: [birth year]  Today's Date: 05/11/2018 PT Individual Time: 0800-0915 PT Individual Time Calculation (min): 75 min   Short Term Goals: Week 2:  PT Short Term Goal 1 (Week 2): = LTG due to LOS  Skilled Therapeutic Interventions/Progress Updates:    Pt received sitting in w/c with MD present; agreeable to PT treatment. Pt reports that she did not sleep well last night and that she is feeling "icky" this morning. Pain 4/10 in LLE at start of session as mentioned below. Pt propelled w/c to sink with supervision to brush teeth.  Pt propelled w/c from room and around the unit x 561ft with supervision for UE endurance. Sit><stand with RW and supervision. Ambulation x 188ft with RW, supervision, and TDWB LLE with KI; patient walked with increased speed compared to previous session. Pt performed car transfer with RW; able to get into the car with S/verbal cues for sequence and increased time, but required minA for LLE management while getting out of the car due to muscle spasms/catching in L hip flexors/adductors; facial expressions indicated increased pain levels as mentioned below. Pt ambulated with RW and TDWB of  LLE with KI up/down ramp with CGA and to mat table with supervision. Passive pin and stretch technique of L hip flexors and adductors in supine to address cramping/tightness and to ease pain; palpable trigger points. LLE strengthening exercises without KI on: heel slides to 25 degrees hip flex with focus on quad usage with knee extension 2 x 10, supine SAQ with 1 pillow 20 x 5 sec, active assisted SLR to ~25 degrees x 20. SPT reapplied KI after completion of exercises. Supervision stand pivot transfers throughout session. Self propelled w/c back to room and remained in w/c with seat alarm intact and all needs in reach.   Therapy Documentation Precautions:  Precautions Precautions: Fall, Posterior  Hip Precaution Booklet Issued: Yes (comment) Precaution Comments: Reviewed posterior hip precautions with patient.   Required Braces or Orthoses: Knee Immobilizer - Left Restrictions Weight Bearing Restrictions: Yes LLE Weight Bearing: Touchdown weight bearing Pain: Pain Assessment Pain Scale: Faces Pain Score: 4 (at start of session) Faces Pain Scale: Hurts whole lot(during car transfer) Pain Type: Acute pain Pain Location: Leg Pain Orientation: Left Pain Descriptors / Indicators: Aching;Cramping;Tender Pain Frequency: Intermittent Pain Onset: On-going Pain Intervention(s): Medication (See eMAR);Therapeutic touch;Ambulation/increased activity Multiple Pain Sites: No   Therapy/Group: Individual Therapy  [place] Dracen Reigle, SPT 05/11/2018, 9:35 AM

## 2018-05-11 NOTE — Progress Notes (Signed)
Occupational Therapy Session Note  Patient Details  Name: Emma Lewis MRN: [id number] Date of Birth: 07/01/1951  Today's Date: 05/11/2018 OT Individual Time: 2957-4734 OT Individual Time Calculation (min): 60 min    Short Term Goals: Week 2:  OT Short Term Goal 1 (Week 2): STG=LTG due to LOS  Skilled Therapeutic Interventions/Progress Updates:  Pt seen for OT session focusing on functional transfers and problem solving shower transfer. Pt sitting up in w/c upon arrival- pain noted as below and RN administering muscle relaxer during session.  She ambulated throughout session short distances with close sueprvision-CGA with RW. SHe self propelled w/c throughout unit with supervision for UE strengthening.  Education/demonstration provided regarding technique for shower stall transfer using RW. Pt completed simulated movements of tasks required, standing at RW from [facility] and "pushing up" with B UEs  To clear B LEs in simulation of hopping over threshold. Pt demonstrated ability to clear heigh of threshold, however, pt voicing pain with movement in B shoulders/chest and told therapist of PMH of shoulder surgeries etc. Therefore, now recommending use of tub transfer bench over shower stall threshold. Following demonstration for technique, pt return demonstrated with pt requiring assist to manage R LE over 3" shower stall threshold. Extensive education provided regarding maintaining hip pre-cautions during functional tasks, reasons for pre-cautions, activity progression and d/c planning. Recommend use of tub transfer bench for use at d/c in order to increase independence and safety with shower stall transfer.  Pt returned to room, ambulated into bathroom and completed clothing management in standing with steadying assist. Pt left seated on toilet to attempt BM. RN made aware of pt's position and pt educated regarding use of pull call bell.   Therapy Documentation Precautions:  Precautions Precautions:  Fall, Posterior Hip Precaution Booklet Issued: Yes (comment) Precaution Comments: Reviewed posterior hip precautions with patient.   Required Braces or Orthoses: Knee Immobilizer - Left Restrictions Weight Bearing Restrictions: Yes LLE Weight Bearing: Touchdown weight bearing Pain: Pain Assessment Pain Scale: 0-10 Pain Score: 2 Pain Type: Acute pain Pain Location: Leg Pain Orientation: Left Pain Descriptors / Indicators: Cramping Pain Frequency: Intermittent Pain Onset: On-going Pain Intervention(s):RN made aware,  Medication (See eMAR) , repositioned Multiple Pain Sites: No   Therapy/Group: Individual Therapy  Swan Zayed L 05/11/2018, 10:56 AM

## 2018-05-11 NOTE — Progress Notes (Addendum)
[place] PHYSICAL MEDICINE & REHABILITATION PROGRESS NOTE  Subjective/Complaints: Patient seen sitting up in her chair this morning.  She states she slept well overnight.  She states she is doing well.  She states she wants to go home.  ROS: Denies CP, SOB, nausea, vomiting, diarrhea.  Objective: Vital Signs: Blood pressure 113/63, pulse 83, temperature 98.1 F (36.7 C), temperature source Oral, resp. rate 18, height 5\' 5"  (1.651 m), weight 86.3 kg, SpO2 93 %. No results found. Recent Labs    05/11/18 0815  WBC 6.7  HGB 10.4*  HCT 34.9*  PLT 317   Recent Labs    05/11/18 0815  NA 138  K 4.0  CL 103  CO2 26  GLUCOSE 164*  BUN 14  CREATININE 0.72  CALCIUM 9.3    Physical Exam: BP 113/63 (BP Location: Left Arm)   Pulse 83   Temp 98.1 F (36.7 C) (Oral)   Resp 18   Ht 5\' 5"  (1.651 m)   Wt 86.3 kg   SpO2 93%   BMI 31.66 kg/m  Constitutional: She appears well-developed. No distress.  HENT: Normocephalic.  Atraumatic. Eyes: EOMI.  No discharge. Cardiovascular: RRR.  No JVD. Respiratory: Effort normal.  Clear. GI: Bowel sounds positive.  She exhibits no distension.  Musculoskeletal: Mild left hip tenderness Left hip edema and tenderness Neurological: She is alert and oriented  Follows full commands.  Motor:  RLE 4+/5 prox to distal, unchanged LLE 1+-2-/5 HF, KE (pain inhibition), 4/5 ADF  Skin: Incision with dressing C/D/I  Assessment/Plan: 1. 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  Physiatrist is providing close team supervision and 24 hour management of active medical problems listed below.  Physiatrist and rehab team continue to assess barriers to discharge/monitor patient progress toward functional and medical goals  Care Tool:  Bathing    Body parts bathed by patient: Right  arm, Left arm, Chest, Abdomen, Front perineal area, Right upper leg, Left upper leg, Right lower leg, Left lower leg, Face   Body parts bathed by helper: Buttocks     Bathing assist Assist Level: Minimal Assistance - Patient > 75%     Upper Body Dressing/Undressing Upper body dressing   What is the patient wearing?: Bra, Pull over shirt    Upper body assist Assist Level: Minimal Assistance - Patient > 75%    Lower Body Dressing/Undressing Lower body dressing      What is the patient wearing?: Underwear/pull up     Lower body assist Assist for lower body dressing: Moderate Assistance - Patient 50 - 74%     Toileting Toileting Toileting Activity did not occur (Clothing management and hygiene only): N/A (no void or bm)  Toileting assist Assist for toileting: Supervision/Verbal cueing     Transfers Chair/bed transfer  Transfers assist     Chair/bed transfer assist level: Moderate Assistance - Patient 50 - 74%     Locomotion Ambulation   Ambulation assist      Assist level: Contact Guard/Touching assist Assistive device: Walker-rolling Max distance: 25   Walk 10 feet activity   Assist  Walk 10 feet activity did not occur: Safety/medical concerns  Assist level: Contact Guard/Touching assist Assistive device: Walker-rolling   Walk 50 feet activity   Assist Walk 50 feet with 2 turns activity did not occur: Safety/medical concerns  Walk 150 feet activity   Assist Walk 150 feet activity did not occur: Safety/medical concerns         Walk 10 feet on uneven surface  activity   Assist Walk 10 feet on uneven surfaces activity did not occur: Safety/medical concerns         Wheelchair     Assist Will patient use wheelchair at discharge?: Yes Type of Wheelchair: Manual    Wheelchair assist level: Supervision/Verbal cueing Max wheelchair distance: 150    Wheelchair 50 feet with 2 turns activity    Assist        Assist  Level: Supervision/Verbal cueing   Wheelchair 150 feet activity     Assist     Assist Level: Supervision/Verbal cueing      Medical Problem List and Plan:  1. 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 Touchdown weightbearing as well as history of right total hip arthroplasty   Continue CIR   Weekend notes reviewed -constipation resolved 2. DVT Prophylaxis/Anticoagulation: SCDs.   Recent Venous Doppler studies negative  3. Pain Management: Oxycodone as needed   ice to left hip   Robaxin increased and scheduled on 10/10  K pad ordered  X-ray left knee, reviewed, mild OA 4. Mood: Provide emotional support  5. Neuropsych: This patient is capable of making decisions on her own behalf.  6. Skin/Wound Care: Routine skin checks  7. Fluids/Electrolytes/Nutrition: Routine in and outs   BMP within acceptable range on 10/14 8. Acute blood loss anemia.    Hemoglobin 10.4 on 10/14 9. Hypothyroidism. Synthroid  10. Hyperlipidemia. Zocor  11. Constipation. scheduled bowel meds senna qhs, colace TID 12. GERD. Protonix  13. Prediabetes. Hemoglobin A1c 5.8   Elevated on 10/14 14. Leukocytosis: Resolved  Afebrile 15.  Transaminitis  LFTs elevated on 10/10, slightly improved  Continue to monitor 16.  Hypoalbuminemia  Supplement initiated on 10/7 17.  LEFT knee OA  X-ray reviewed, showing medial compartment OA  Added diclofenac    LOS: 10 days A FACE TO FACE EVALUATION WAS PERFORMED  Bertina Guthridge Lorie Phenix 05/11/2018, 9:22 AM

## 2018-05-12 ENCOUNTER — Inpatient Hospital Stay (HOSPITAL_COMMUNITY): Payer: Medicare Other | Admitting: Occupational Therapy

## 2018-05-12 ENCOUNTER — Inpatient Hospital Stay (HOSPITAL_COMMUNITY): Payer: Medicare Other | Admitting: Physical Therapy

## 2018-05-12 ENCOUNTER — Inpatient Hospital Stay (INDEPENDENT_AMBULATORY_CARE_PROVIDER_SITE_OTHER): Payer: Medicare Other | Admitting: Orthopaedic Surgery

## 2018-05-12 DIAGNOSIS — K219 Gastro-esophageal reflux disease without esophagitis: Secondary | ICD-10-CM

## 2018-05-12 NOTE — Progress Notes (Signed)
Physical Therapy Session Note  Patient Details  Name: Emma Lewis MRN: [id number] Date of Birth: 23-Aug-1950  Today's Date: 05/12/2018 PT Individual Time: 0800-0900 PT Individual Time Calculation (min): 60 min   Short Term Goals: Week 2:  PT Short Term Goal 1 (Week 2): = LTG due to LOS  Skilled Therapeutic Interventions/Progress Updates:    Pt received laying in bed; has not received pain meds yet; agreeable to PT. 5/10 pain at the incision site; pt reports bed pan "got caught on it last night;" no signs of infection or torn staple. 8/10 soreness of LLE and chest area following PT and OT sessions yesterday. S dressing while sitting in bed; use of reacher for threading LEs into pants/donning pants and assist needed to hook bra. MinA bed mobility for rolling L/R to aid with pulling pants over hips and S with sitting>scooting EOB. S stand pivot transfer to/from w/c throughout session. S w/c mobility to sink to brush teeth and to rehab gym x 126ft. S standing hip flexion and abduction exercises (2x10 each) for L hip flexor/abducutor strengthening, while simultaneously strengthening R hip musculature in modified SLS (since LLE is restricted with TBWB status). Pt required seated rest breaks between each rep due to fatigue. Attempted ambulation for return to room but pt limited by chest/UE pain/soreness. TotalA w/c transport to room. Discussed w/c size, door frame size, mobility in w/c around the home, and options regarding renting/buying w/c for short vs long term use upon discharge. Pt remained sitting in w/c with all needs in reach.   Therapy Documentation Precautions:  Precautions Precautions: Fall, Posterior Hip Precaution Booklet Issued: Yes (comment) Precaution Comments: Reviewed posterior hip precautions with patient.   Required Braces or Orthoses: Knee Immobilizer - Left Restrictions Weight Bearing Restrictions: Yes LLE Weight Bearing: Touchdown weight bearing Pain: Pain Assessment Pain  Scale: 0-10 Pain Score: 5  Pain Type: Acute pain Pain Location: Incision Pain Orientation: Left;Upper;Posterior;Lateral Pain Descriptors / Indicators: Discomfort;Pressure Pain Onset: On-going Multiple Pain Sites: Yes 2nd Pain Site Pain Score: 8 Pain Type: Acute pain Pain Location: Leg Pain Orientation: Left;Upper;Mid;Lower;Anterior Pain Descriptors / Indicators: Aching;Sore Pain Onset: On-going Pain Intervention(s): Repositioned;Ambulation/increased activity 3rd Pain Site Pain Score: 8 Pain Type: Acute pain Pain Location: Chest Pain Orientation: Anterior Pain Descriptors / Indicators: Sore;Aching Pain Intervention(s): Relaxation    Therapy/Group: Individual Therapy  [place] Adeola Dennen 05/12/2018, 9:15 AM

## 2018-05-12 NOTE — Progress Notes (Signed)
[place] PHYSICAL MEDICINE & REHABILITATION PROGRESS NOTE  Subjective/Complaints: Patient seen sitting up in bed this morning.  She states she slept well overnight.  She states she wants to go home.  She has questions about on her staples are going to be DC'd.  ROS: Denies CP, SOB, nausea, vomiting, diarrhea.  Objective: Vital Signs: Blood pressure 127/63, pulse 86, temperature 97.8 F (36.6 C), temperature source Oral, resp. rate 17, height 5\' 5"  (1.651 m), weight 86.3 kg, SpO2 100 %. No results found. Recent Labs    05/11/18 0815  WBC 6.7  HGB 10.4*  HCT 34.9*  PLT 317   Recent Labs    05/11/18 0815  NA 138  K 4.0  CL 103  CO2 26  GLUCOSE 164*  BUN 14  CREATININE 0.72  CALCIUM 9.3    Physical Exam: BP 127/63 (BP Location: Left Arm)   Pulse 86   Temp 97.8 F (36.6 C) (Oral)   Resp 17   Ht 5\' 5"  (1.651 m)   Wt 86.3 kg   SpO2 100%   BMI 31.66 kg/m  Constitutional: She appears well-developed. No distress.  HENT: Normocephalic.  Atraumatic. Eyes: EOMI.  No discharge. Cardiovascular: RRR.  No JVD. Respiratory: Effort normal.  Clear. GI: Bowel sounds positive.  She exhibits no distension.  Musculoskeletal: Mild left hip tenderness, stable Left hip edema and tenderness Neurological: She is alert and oriented  Follows full commands.  Motor:  RLE 4+/5 prox to distal, stable LLE 2--3--/5 HF, KE (pain inhibition), 4/5 ADF  Skin: Incision with dressing C/D/I  Assessment/Plan: 1. 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  Physiatrist is providing close team supervision and 24 hour management of active medical problems listed below.  Physiatrist and rehab team continue to assess barriers to discharge/monitor patient progress toward functional and medical goals  Care Tool:  Bathing     Body parts bathed by patient: Right arm, Left arm, Chest, Abdomen, Front perineal area, Right upper leg, Left upper leg, Right lower leg, Left lower leg, Face   Body parts bathed by helper: Buttocks     Bathing assist Assist Level: Minimal Assistance - Patient > 75%     Upper Body Dressing/Undressing Upper body dressing   What is the patient wearing?: Bra, Pull over shirt    Upper body assist Assist Level: Minimal Assistance - Patient > 75%    Lower Body Dressing/Undressing Lower body dressing      What is the patient wearing?: Underwear/pull up     Lower body assist Assist for lower body dressing: Moderate Assistance - Patient 50 - 74%     Toileting Toileting Toileting Activity did not occur Landscape architect and hygiene only): N/A (no void or bm)  Toileting assist Assist for toileting: Supervision/Verbal cueing     Transfers Chair/bed transfer  Transfers assist  Chair/bed transfer activity did not occur: Safety/medical concerns  Chair/bed transfer assist level: Supervision/Verbal cueing     Locomotion Ambulation   Ambulation assist   Ambulation activity did not occur: Refused  Assist level: Supervision/Verbal cueing Assistive device: Walker-rolling(TDWB LLE with KI) Max distance: 100'   Walk 10 feet activity   Assist  Walk 10 feet activity did not occur: Safety/medical concerns  Assist level: Supervision/Verbal cueing Assistive device: Walker-rolling   Walk 50 feet activity   Assist Walk 50 feet with 2 turns activity  did not occur: Safety/medical concerns  Assist level: Supervision/Verbal cueing Assistive device: Walker-rolling    Walk 150 feet activity   Assist Walk 150 feet activity did not occur: Safety/medical concerns         Walk 10 feet on uneven surface  activity   Assist Walk 10 feet on uneven surfaces activity did not occur: Safety/medical concerns   Assist level: Supervision/Verbal cueing Assistive device:  Aeronautical engineer Will patient use wheelchair at discharge?: Yes Type of Wheelchair: Manual    Wheelchair assist level: Supervision/Verbal cueing, Set up assist(set up assist for leg rest management) Max wheelchair distance: 500'    Wheelchair 50 feet with 2 turns activity    Assist        Assist Level: Supervision/Verbal cueing   Wheelchair 150 feet activity     Assist     Assist Level: Supervision/Verbal cueing      Medical Problem List and Plan:  1. 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 Touchdown weightbearing as well as history of right total hip arthroplasty   Continue CIR   Will DC sutures tomorrow 2. DVT Prophylaxis/Anticoagulation: SCDs.   Recent Venous Doppler studies negative  3. Pain Management: Oxycodone as needed   ice to left hip   Robaxin increased and scheduled on 10/10  K pad ordered  X-ray left knee, reviewed, mild OA  Controlled on 10/15 4. Mood: Provide emotional support  5. Neuropsych: This patient is capable of making decisions on her own behalf.  6. Skin/Wound Care: Routine skin checks  7. Fluids/Electrolytes/Nutrition: Routine in and outs   BMP within acceptable range on 10/14 8. Acute blood loss anemia.    Hemoglobin 10.4 on 10/14 9. Hypothyroidism. Synthroid  10. Hyperlipidemia. Zocor  11. Constipation. scheduled bowel meds senna qhs, colace TID 12. GERD. Protonix   Controlled on 10/15 13. Prediabetes. Hemoglobin A1c 5.8   Elevated on 10/14 14. Leukocytosis: Resolved  Afebrile 15.  Transaminitis  LFTs elevated on 10/10, slightly improved  Continue to monitor 16.  Hypoalbuminemia  Supplement initiated on 10/7 17.  LEFT knee OA  X-ray reviewed, showing medial compartment OA  Added diclofenac  LOS: 11 days A FACE TO FACE EVALUATION WAS  PERFORMED  Ambrea Hegler Lorie Phenix 05/12/2018, 8:30 AM

## 2018-05-12 NOTE — Progress Notes (Signed)
Occupational Therapy Session Note  Patient Details  Name: Emma Lewis MRN: [id number] Date of Birth: Mar 22, 1951  Today's Date: 05/12/2018 OT Individual Time: 1330-1430 OT Individual Time Calculation (min): 60 min    Short Term Goals: Week 2:  OT Short Term Goal 1 (Week 2): STG=LTG due to LOS  Skilled Therapeutic Interventions/Progress Updates:    Pt sitting in w/c upon entry with reports of pain in LLE however current on pain medications. Pt performed all functional transfers this session and ambulation with RW and close supervision. Pt ambulated to toilet and performed hygiene tasks while seated. Pt returned to w/c and then performed w/c mobility with focus on BUE strengthening in hallways to outside area for focus on sit to stands, community mobility and UE strengthening for ~80 feet. In outside area, pt performed sit to stand transfers from various surfaces with focus on use of RW and transferring to different heights/pieces of furniture.  During seated rest break, pt used sock aid to don clean socks with no VC's required for technique and good adherence to L total hip precautions. Education provided regarding use of AE during self care tasks and modifications to be made by placing support under LLE heel for increased support and decreased pain. Discussed with pt regarding new day time routine and night time toileting routine upon d/c with education provided regarding pacing strategies throughout day and method for performing nighttime toileting with verbal comprehension. Pt transferred back to w/c and propelled self partial distance back to room. In room, EOB - BSC - w/c stand pivot transfer transfer performed to simulate night time toileting for decreased risk of falls. Plans to continue nighttime toileting routine with this method during length of stay. Pt left in room seated in w/c with all needs in reach.   Therapy Documentation Precautions:  Precautions Precautions: Fall, Posterior  Hip Precaution Booklet Issued: Yes (comment) Precaution Comments: Reviewed posterior hip precautions with patient.   Required Braces or Orthoses: Knee Immobilizer - Left Restrictions Weight Bearing Restrictions: Yes LLE Weight Bearing: Touchdown weight bearing ADL: ADL Where Assessed-Toileting: Other (Comment)(BSC over toilet) Toilet Transfer: Close supervision Toilet Transfer Method: Ambulating   Therapy/Group: Individual Therapy  Emma Lewis 05/12/2018, 3:12 PM

## 2018-05-12 NOTE — Progress Notes (Signed)
Physical Therapy Session Note  Patient Details  Name: Emma Lewis MRN: [id number] Date of Birth: 21-Jul-1951  Today's Date: 05/12/2018 PT Individual Time: 1450-1559 PT Individual Time Calculation (min): 69 min   Short Term Goals: Week 2:  PT Short Term Goal 1 (Week 2): = LTG due to LOS  Skilled Therapeutic Interventions/Progress Updates:   Pt in w/c and agreeable to therapy, pain as detailed below and RN aware. Pt self-propelled w/c to/from therapy gym w/ supervision. Worked on setting up transfers including performing w/c parts management w/o assist from therapist, min verbal cues needed. Performed LLE strengthening exercises within surgical precautions. Supine included active assisted SLR 3x5, active assisted heel slides within available ROM 3x10, and SAQs 3x10. Seated included LAQs within available range 3x10, isometric abduction 1x10, and isometric knee flexion @ 90 deg 1x10. Standing exercises included hip flexion 2x10 and hip abduction 2x10 within available and pain free range. Tactile cues for neutral LE alignment. Returned to room via w/c, ended session in w/c and all needs met. Ice applied to L hip and heat pack applied to L groin.   Therapy Documentation Precautions:  Precautions Precautions: Fall, Posterior Hip Precaution Booklet Issued: Yes (comment) Precaution Comments: Reviewed posterior hip precautions with patient.   Required Braces or Orthoses: Knee Immobilizer - Left Restrictions Weight Bearing Restrictions: Yes LLE Weight Bearing: Touchdown weight bearing Vital Signs: Therapy Vitals Pulse Rate: 78 Resp: 19 BP: 123/70 Patient Position (if appropriate): Lying Oxygen Therapy SpO2: 93 % O2 Device: Room Air Pain: Pain Assessment Pain Scale: 0-10 Pain Score: 8  Pain Type: Acute pain Pain Location: Incision Pain Orientation: Left Pain Radiating Towards: leg Pain Descriptors / Indicators: Aching;Discomfort;Dull Pain Frequency: Intermittent Pain Onset: With  Activity Pain Intervention(s): Medication (See eMAR)  Therapy/Group: Individual Therapy   Burnard Bunting, PT, DPT  05/12/2018, 4:03 PM

## 2018-05-13 ENCOUNTER — Inpatient Hospital Stay (HOSPITAL_COMMUNITY): Payer: Medicare Other | Admitting: Physical Therapy

## 2018-05-13 ENCOUNTER — Inpatient Hospital Stay (HOSPITAL_COMMUNITY): Payer: Medicare Other

## 2018-05-13 ENCOUNTER — Inpatient Hospital Stay (HOSPITAL_COMMUNITY): Payer: Medicare Other | Admitting: Occupational Therapy

## 2018-05-13 NOTE — Progress Notes (Signed)
[place] PHYSICAL MEDICINE & REHABILITATION PROGRESS NOTE  Subjective/Complaints: Patient seen working with therapy this morning.  She states she slept well overnight.  She states she had some pain in her left lower extremity, but it was "good pain".  She enjoyed improved range of motion.  She states she is eager to go home and wants to know the date.  ROS: Denies CP, SOB, nausea, vomiting, diarrhea.  Objective: Vital Signs: Blood pressure (!) 103/56, pulse 78, temperature 98.3 F (36.8 C), temperature source Oral, resp. rate 18, height 5\' 5"  (1.651 m), weight 86.3 kg, SpO2 95 %. No results found. Recent Labs    05/11/18 0815  WBC 6.7  HGB 10.4*  HCT 34.9*  PLT 317   Recent Labs    05/11/18 0815  NA 138  K 4.0  CL 103  CO2 26  GLUCOSE 164*  BUN 14  CREATININE 0.72  CALCIUM 9.3    Physical Exam: BP (!) 103/56 (BP Location: Left Arm)   Pulse 78   Temp 98.3 F (36.8 C) (Oral)   Resp 18   Ht 5\' 5"  (1.651 m)   Wt 86.3 kg   SpO2 95%   BMI 31.66 kg/m  Constitutional: She appears well-developed. No distress.  HENT: Normocephalic.  Atraumatic. Eyes: EOMI.  No discharge. Cardiovascular: RRR.  No JVD. Respiratory: Effort normal.  Clear. GI: Bowel sounds positive.  She exhibits no distension.  Musculoskeletal: Mild left hip tenderness, unchanged Left hip edema and tenderness Neurological: She is alert and oriented  Follows full commands.  Motor:  RLE 4+/5 prox to distal, unchanged LLE 2--3--/5 HF, 3+/5 KE (pain inhibition), 4/5 ADF  Skin: Incision with staples C/D/I  Assessment/Plan: 1. 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  Physiatrist is providing close team supervision and 24 hour management of active medical problems listed below.  Physiatrist and rehab team continue to assess  barriers to discharge/monitor patient progress toward functional and medical goals  Care Tool:  Bathing    Body parts bathed by patient: Right arm, Left arm, Chest, Abdomen, Front perineal area, Right upper leg, Left upper leg, Right lower leg, Left lower leg, Face   Body parts bathed by helper: Buttocks     Bathing assist Assist Level: Minimal Assistance - Patient > 75%     Upper Body Dressing/Undressing Upper body dressing   What is the patient wearing?: Bra, Pull over shirt    Upper body assist Assist Level: Minimal Assistance - Patient > 75%    Lower Body Dressing/Undressing Lower body dressing      What is the patient wearing?: Underwear/pull up     Lower body assist Assist for lower body dressing: Moderate Assistance - Patient 50 - 74%     Toileting Toileting Toileting Activity did not occur Landscape architect and hygiene only): N/A (no void or bm)  Toileting assist Assist for toileting: Supervision/Verbal cueing     Transfers Chair/bed transfer  Transfers assist  Chair/bed transfer activity did not occur: Safety/medical concerns  Chair/bed transfer assist level: Supervision/Verbal cueing     Locomotion Ambulation   Ambulation assist   Ambulation activity did not occur: Refused  Assist level: Supervision/Verbal cueing Assistive device: Walker-rolling Max distance: 100'   Walk 10 feet activity   Assist  Walk 10 feet activity did not occur: Safety/medical concerns  Assist level: Supervision/Verbal cueing Assistive device:  Walker-rolling   Walk 50 feet activity   Assist Walk 50 feet with 2 turns activity did not occur: Safety/medical concerns  Assist level: Supervision/Verbal cueing Assistive device: Walker-rolling    Walk 150 feet activity   Assist Walk 150 feet activity did not occur: Safety/medical concerns         Walk 10 feet on uneven surface  activity   Assist Walk 10 feet on uneven surfaces activity did not occur:  Safety/medical concerns   Assist level: Supervision/Verbal cueing Assistive device: Aeronautical engineer Will patient use wheelchair at discharge?: Yes Type of Wheelchair: Manual    Wheelchair assist level: Supervision/Verbal cueing Max wheelchair distance: 150'    Wheelchair 50 feet with 2 turns activity    Assist        Assist Level: Supervision/Verbal cueing   Wheelchair 150 feet activity     Assist     Assist Level: Supervision/Verbal cueing      Medical Problem List and Plan:  1. 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 Touchdown weightbearing as well as history of right total hip arthroplasty   Continue CIR   DC staples  Team conference today to discuss current and goals and coordination of care, home and environmental barriers, and discharge planning with nursing, case manager, and therapies.  2. DVT Prophylaxis/Anticoagulation: SCDs.   Recent Venous Doppler studies negative  3. Pain Management: Oxycodone as needed   ice to left hip   Robaxin increased and scheduled on 10/10  K pad ordered  X-ray left knee, reviewed, mild OA  Controlled on 10/16 4. Mood: Provide emotional support  5. Neuropsych: This patient is capable of making decisions on her own behalf.  6. Skin/Wound Care: Routine skin checks  7. Fluids/Electrolytes/Nutrition: Routine in and outs   BMP within acceptable range on 10/14 8. Acute blood loss anemia.    Hemoglobin 10.4 on 10/14 9. Hypothyroidism. Synthroid  10. Hyperlipidemia. Zocor  11. Constipation. scheduled bowel meds senna qhs, colace TID 12. GERD. Protonix   Controlled on 10/15 13. Prediabetes. Hemoglobin A1c 5.8   Elevated on 10/14  Labs ordered for tomorrow 14. Leukocytosis: Resolved  Afebrile 15.  Transaminitis  LFTs elevated on 10/10, slightly  improved  Labs ordered for tomorrow  Continue to monitor 16.  Hypoalbuminemia  Supplement initiated on 10/7 17.  LEFT knee OA  X-ray reviewed, showing medial compartment OA  Added diclofenac  LOS: 12 days A FACE TO FACE EVALUATION WAS PERFORMED  Talea Manges Lorie Phenix 05/13/2018, 8:29 AM

## 2018-05-13 NOTE — Progress Notes (Signed)
Physical Therapy Session Note  Patient Details  Name: Emma Lewis MRN: [id number] Date of Birth: November 01, 1950  Today's Date: 05/13/2018 PT Individual Time: 1030-1100  PT Individual Time Calculation (min): 30 min   Short Term Goals: Week 2:  PT Short Term Goal 1 (Week 2): = LTG due to LOS  Skilled Therapeutic Interventions/Progress Updates:    Pt received sitting in w/c and agreeable to PT treatment. S stand pivot transfer w/ RW to bed. S bed mobility (sit>supine, rolling R/L, supine>sit) without bed rails to mimic bed at home (raises up and down and head/foot of bed raises and lowers; no rails; two twin beds together); pt required increased time, but did use edge of bed to assist with rolling; rolling to L more difficult to R due to incision pain. Pt was able to state 3/3 hip precautions and maintained them throughout bed mobility. S stand pivot transfer w/ RW to w/c and S w/c mobility x 128ft to hallway. S ambulation x 115ft with RW. KI on during the entire session and pt maintained TDWB precaution. Pt remained sitting at the end of the session with all needs in reach.  Therapy Documentation Precautions:  Precautions Precautions: Fall, Posterior Hip Precaution Booklet Issued: Yes (comment) Precaution Comments: Reviewed posterior hip precautions with patient.   Required Braces or Orthoses: Knee Immobilizer - Left Restrictions Weight Bearing Restrictions: Yes LLE Weight Bearing: Touchdown weight bearing   Therapy/Group: Individual Therapy  [place] Shena Vinluan 05/13/2018, 12:23 PM

## 2018-05-13 NOTE — Progress Notes (Addendum)
Physical Therapy Session Note  Patient Details  Name: Emma Lewis MRN: [id number] Date of Birth: 11/16/1950  Today's Date: 05/13/2018 PT Individual Time: 1135-1205 PT Individual Time Calculation (min): 30 min   Short Term Goals:  Week 2:  PT Short Term Goal 1 (Week 2): = LTG due to LOS  Skilled Therapeutic Interventions/Progress Updates:   Pt seated in w/c, c/o of L anterior quad muscle spasms.  PT applied heat pack, removed KI , and lowered foot to floor with good results.    PT instructed pt in muscle releases on several tender areas of thigh, using knuckles of L hand, counting aloud for 30 seconds.  Pt has pain which extends from L anterior thigh distally, lateral to patella.   Pt reported that with her original LTHA, she had femoral nerve damage.  Small excursions 5 x 1 L knee extension. RLE seated straight leg raise at 90 degrees while doing "foot alphabet" for extended quad set.   Pt set up for lunch, left resting in w/c with needs at hand and LLE KI donned and LE elevated.    tx 2:  [id number], 75 min individual tx Pain: 8/10 premedicated  Discussed d/c.  Pt stated that she will be going home in an SUV.  It has bucket seats in front and back.  She declined practicing in simulated car.  W/c propulsion on unit with supervision. Pt locked brakes and removed bil foot rests with tactile cues for L ELR.   Gait training with RW, L KI x 18' with min guard assist.  Standing outside of parallel bars with bil UE support for 15 x 1 L hip abduction (RLE on 1" high surface), 5 x 3 L hip abduction; standing within parallel bars, pt lightly bumped/kicked cones using hip flexors, x 6, and lightly nudged cones laterally using hip abductors, x 4. No increase in L thigh pain or spasms.  Seated in w/c, pt managed R footrest with 1 cue, L ELR with tactile cues.  Repeated mgt of L ELR with 1 cue for sequencing to move R footrest out of the way initially to leave room to move LLE off of ELR and  swing away, x 3 reps placing footrests to/from waist- high table. .   Gait training on carpet with RW, x 25' including turns and backwards walking, within hip precautions, with min guard assist, x 2   Pt propelled back to room.    Santiago Glad, RN transferred pt back to bed to remove surgical staples.     PT provided heat pack for L anterior thigh, and ice pack for lateral thigh.  Pt left supine in bed with Santiago Glad, RN attending her.  Therapy Documentation Precautions:  Precautions Precautions: Fall, Posterior Hip Precaution Booklet Issued: Yes (comment) Precaution Comments: Reviewed posterior hip precautions with patient.   Required Braces or Orthoses: Knee Immobilizer - Left Restrictions Weight Bearing Restrictions: Yes LLE Weight Bearing: Touchdown weight bearing  Pain: Pain Assessment Pain Scale: 0-10 Pain Score: 8 AM and PM txs Pain Orientation: Left Pain Radiating Towards: leg Pain Descriptors / Indicators: Aching;Discomfort;Spasm Pain Frequency: Intermittent Pain Onset: With Activity Pain Intervention(s): Medication (See eMAR)   Therapy/Group: Individual Therapy  Demarkus Remmel 05/13/2018, 12:15 PM

## 2018-05-13 NOTE — Patient Care Conference (Signed)
Inpatient RehabilitationTeam Conference and Plan of Care Update Date: 05/13/2018   Time: 10:15 AM    Patient Name: Emma Lewis      Medical Record Number: [id number]  Date of Birth: 1950/08/06 Sex: Female         Room/Bed: 4M12C/4M12C-01 Payor Info: Payor: MEDICARE / Plan: MEDICARE PART A AND B / Product Type: *No Product type* /    Admitting Diagnosis: L hip arthroplasty  Admit Date/Time:  05/01/2018  4:33 PM Admission Comments: No comment available   Primary Diagnosis:  <principal problem not specified> Principal Problem: <principal problem not specified>  Patient Active Problem List   Diagnosis Date Noted  . Prediabetes   . Popping sound of knee joint   . Hypoalbuminemia due to protein-calorie malnutrition ([place])   . Transaminitis   . Leukocytosis   . Postoperative pain   . Failure of left total hip arthroplasty ([place]) 05/01/2018  . Acute blood loss anemia   . Leukemoid reaction   . 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 Joint effusion of pelvis or thigh, left 04/22/2018  . Trochanteric bursitis, left hip 04/22/2018  . History of left hip replacement 11/12/2017  . Bilateral primary osteoarthritis of knee 11/12/2017  . GERD (gastroesophageal reflux disease) 01/17/2017  . Hypothyroidism 01/17/2017  . Vertigo 01/16/2017  . Nausea and vomiting 01/16/2017  . Essential hypertension 01/16/2017  . Fatty liver 01/16/2017  . Failed total hip arthroplasty ([place]) 06/10/2016  . Pelvic mass 06/15/2015  . Closed wedge compression fracture of fifth lumbar vertebra ([place])   . Cervical spondylosis 02/09/2014  . DDD (degenerative disc disease), lumbar 05/13/2013  . OA (osteoarthritis) 05/13/2013    Expected Discharge Date: Expected Discharge Date: 05/15/18  Team Members Present: Physician leading conference: Dr. Delice Lesch Social Worker Present: Ovidio Kin, LCSW Nurse Present: Isla Pence, RN PT Present: Kem Parkinson, PT OT Present: Napoleon Form, OT SLP Present: Windell Moulding, SLP PPS  Coordinator present : Daiva Nakayama, RN, CRRN     Current Status/Progress Goal Weekly Team Focus  Medical   Decreased functional mobility secondary to left total hip arthroplasty metallosis status post left total hip arthroplasty, posterior approach with osteotomy of femur, cable fixation 04/29/2018. Touchdown weightbearing as well as history of right total hip arthroplasty   Improve mobility, transfers, pain, LFTs  See above   Bowel/Bladder   Continent of bladder and bowel, Last BM 05/11/2018  maintain Continence  QS and prn assessment of toileting needs, address all concerns and issues, Noitify medical team prn   Swallow/Nutrition/ Hydration             ADL's   close supervision functional ambulation, ADL, toileting and transfers  mod I overall; supervision for shower stall transfer  endurance, functional transfers, ADL re-training, night time toileting routine, d/c planning    Mobility   minA bed mobility, S stand pivot transfers with RW, S gait w/ RW up to 192ft, ModI w/c propulsion up to 554ft  Supervision overall  activity tolerance/endurance, transfers, LE strengthening (needs LLE AAROM before KI can be d/c)   Communication             Safety/Cognition/ Behavioral Observations            Pain   Verbalize complaint of surgical pain to left hip with muscle spasm to bilateral upper thigh discomfort > in left thigh,,medicate with prn Oxy 5mg -10 mg, rates pain at interval 8/10  , 3  Assess QS and prn ,administer prn muscle cream ,and meds, apply ice packs  to left hip prn, Re-assess for effectivness of meds,Notify MD/POA prn if unresolved   Skin   Surgical left total hip incision, staples intact, wound healing well, afebrile no drainage  free from infection/breakdown  QS assessment and prn, educate patient and family on wound care and complications      *See Care Plan and progress notes for long and short-term goals.     Barriers to Discharge  Current Status/Progress Possible  Resolutions Date Resolved   Physician    Medical stability;Weight;Weight bearing restrictions     See above  Therapies,  follow labs      Nursing                  PT                    OT                  SLP                SW                Discharge Planning/Teaching Needs:  Home with husband who will be there with her for a short time. Pt very motivated to be mod/i wheelchair level      Team Discussion:  Progressing toward her goals of mod/i-supervision wheelchair level. Pain better controlled. DC staples today. Pt wants to see surgeon today and has called him. Activity tolerance better. Needs clarificaiton regaridng knee immoblizer when DC home.   Revisions to Treatment Plan:  DC 10/18    Continued Need for Acute Rehabilitation Level of Care: The patient requires daily medical management by a physician with specialized training in physical medicine and rehabilitation for the following conditions: Daily direction of a multidisciplinary physical rehabilitation program to ensure safe treatment while eliciting the highest outcome that is of practical value to the patient.: Yes Daily medical management of patient stability for increased activity during participation in an intensive rehabilitation regime.: Yes Daily analysis of laboratory values and/or radiology reports with any subsequent need for medication adjustment of medical intervention for : Post surgical problems;Other   I attest that I was present, lead the team conference, and concur with the assessment and plan of the team.   Elease Hashimoto 05/13/2018, 1:59 PM

## 2018-05-13 NOTE — Progress Notes (Signed)
Occupational Therapy Session Note  Patient Details  Name: Emma Lewis MRN: [id number] Date of Birth: [birth year]  Today's Date: 05/13/2018 OT Individual Time: 5374-8270 OT Individual Time Calculation (min): 75 min    Short Term Goals: Week 2:  OT Short Term Goal 1 (Week 2): STG=LTG due to LOS  Skilled Therapeutic Interventions/Progress Updates:    Pt supine in bed upon entry with no reports of pain this session and reports of carryover of using BSC at bedside for night time toileting with RN. Pt performed functional transfers and ambulation with RW and close supervision. Pt ambulated to bathroom and performed bathing tasks while seated using lateral leans for buttocks area and long handled sponge for adherence to posterior hip precautions. During drying and pulling up underwear pt required min VC's and education regarding wearing KI during all standing tasks due to pt initiating performing tasks without brace. Pt then ambulated to EOB and performed dressing tasks with AE PRN demonstrating good carryover and adherence to precautions.   Pt performed remaining ambulation and tasks with R shoe donned vs sock to increase challenge in order to simulate home/community mobility with pt able to clear BLE during ambulation. Standing at sink with RW pt performed grooming tasks ~12 mins including drying hair in order to challenge dynamic standing balance and activity tolerance. Pt educated on performing grooming tasks while seated at home for decreased fall risk with verbal comprehension from pt. Pt then returned to w/c and left seated in room with all needs in reach.   Therapy Documentation Precautions:  Precautions Precautions: Fall, Posterior Hip Precaution Booklet Issued: Yes (comment) Precaution Comments: Reviewed posterior hip precautions with patient.   Required Braces or Orthoses: Knee Immobilizer - Left Restrictions Weight Bearing Restrictions: Yes LLE Weight Bearing: Touchdown weight  bearing ADL: ADL Grooming: Supervision/safety Where Assessed-Grooming: Standing at sink Upper Body Bathing: Supervision/safety Where Assessed-Upper Body Bathing: Shower Lower Body Bathing: Supervision/safety Where Assessed-Lower Body Bathing: Shower Upper Body Dressing: Supervision/safety Where Assessed-Upper Body Dressing: Edge of bed Lower Body Dressing: Supervision/safety Where Assessed-Lower Body Dressing: Edge of bed Social research officer, government: Close supervision Social research officer, government Method: [employer]: Grab bars, Transfer tub bench   Therapy/Group: Individual Therapy  Phelan Schadt 05/13/2018, 8:39 AM

## 2018-05-13 NOTE — Progress Notes (Signed)
Staples removed from left hip, pt tolerated well. Pt sitting in w/c with heat pack to left leg and ice pack to left hip. Will continue to monitor.

## 2018-05-14 ENCOUNTER — Inpatient Hospital Stay (HOSPITAL_COMMUNITY): Payer: Medicare Other | Admitting: Occupational Therapy

## 2018-05-14 ENCOUNTER — Inpatient Hospital Stay (HOSPITAL_COMMUNITY): Payer: Medicare Other | Admitting: Physical Therapy

## 2018-05-14 LAB — COMPREHENSIVE METABOLIC PANEL
ALBUMIN: 2.8 g/dL — AB (ref 3.5–5.0)
ALK PHOS: 246 U/L — AB (ref 38–126)
ALT: 32 U/L (ref 0–44)
ANION GAP: 6 (ref 5–15)
AST: 40 U/L (ref 15–41)
BILIRUBIN TOTAL: 0.5 mg/dL (ref 0.3–1.2)
BUN: 11 mg/dL (ref 8–23)
CALCIUM: 9.3 mg/dL (ref 8.9–10.3)
CO2: 26 mmol/L (ref 22–32)
Chloride: 107 mmol/L (ref 98–111)
Creatinine, Ser: 0.73 mg/dL (ref 0.44–1.00)
GLUCOSE: 100 mg/dL — AB (ref 70–99)
POTASSIUM: 4.1 mmol/L (ref 3.5–5.1)
Sodium: 139 mmol/L (ref 135–145)
TOTAL PROTEIN: 6 g/dL — AB (ref 6.5–8.1)

## 2018-05-14 NOTE — Progress Notes (Signed)
Physical Therapy Discharge Summary  Patient Details  Name: Emma Lewis MRN: [id number] Date of Birth: 25-Feb-1951  Today's Date: 05/14/2018 PT Individual Time: 0915-1030 and 1300-1345 PT Individual Time Calculation (min): 120 min    Patient has met 10 of 10 long term goals due to improved activity tolerance, improved balance, improved postural control, increased strength and decreased pain.  Patient to discharge at a wheelchair level [place].   Patient's care partner is independent to provide the necessary physical assistance at discharge.  Reasons goals not met: All goals met.  Recommendation:  Patient will benefit from ongoing skilled PT services in home health setting to continue to advance safe functional mobility, address ongoing impairments in activity tolerance, gait mechanics, pain management, and minimize fall risk.  Equipment: manual wheelchair  Reasons for discharge: treatment goals met and discharge from hospital  Patient/family agrees with progress made and goals achieved: Yes  PT Discharge Precautions/Restrictions Precautions Precautions: Posterior Hip Precaution Comments: Reviewed posterior hip precautions with patient- pt able to recall all  Restrictions Weight Bearing Restrictions: Yes LLE Weight Bearing: Touchdown weight bearing Vital Signs Therapy Vitals Temp: 98 F (36.7 C) Temp Source: Oral Pulse Rate: 77 Resp: 18 BP: 120/61 Patient Position (if appropriate): Sitting Oxygen Therapy SpO2: 98 % O2 Device: Room Air Pain Pain Assessment Pain Scale: 0-10 Pain Score: 8  Pain Type: Acute pain Pain Location: Leg Pain Orientation: Left Pain Descriptors / Indicators: Aching;Cramping;Squeezing;Heaviness Pain Onset: On-going Pain Intervention(s): Medication (See eMAR);Therapeutic touch 2nd Pain Site Pain Score: 8 Pain Location: Chest Pain Orientation: Left;Anterior;Right;Upper Pain Descriptors / Indicators: Aching;Sore Pain Onset: On-going Pain  Intervention(s): Medication (See eMAR) Vision/Perception  Perception Perception: Within Functional Limits Praxis Praxis: Intact  Cognition Overall Cognitive Status: Within Functional Limits for tasks assessed Arousal/Alertness: Awake/alert Orientation Level: Oriented X4 Memory: Appears intact Awareness: Appears intact Problem Solving: Appears intact Safety/Judgment: Appears intact Sensation Sensation Light Touch: Appears Intact Coordination Gross Motor Movements are Fluid and Coordinated: Yes Fine Motor Movements are Fluid and Coordinated: Yes Motor  Motor Motor: Within Functional Limits  Mobility Bed Mobility Bed Mobility: Rolling Right;Rolling Left;Supine to Sit;Sit to Sidelying Left Rolling Right: Supervision/verbal cueing Rolling Left: Supervision/Verbal cueing Supine to Sit: Supervision/Verbal cueing Sit to Sidelying Left: Supervision/Verbal cueing Transfers Transfers: Stand Pivot Transfers;Sit to Stand Sit to Stand: Supervision/Verbal cueing Stand Pivot Transfers: Supervision/Verbal cueing Stand Pivot Transfer Details: Verbal cues for precautions/safety Transfer (Assistive device): Rolling walker Locomotion  Gait Ambulation: Yes Gait Assistance: Supervision/Verbal cueing Gait Distance (Feet): 50 Feet Assistive device: Rolling walker Gait Assistance Details: Verbal cues for precautions/safety Gait Assistance Details: d/c KI today 05/14/18 Gait Gait: Yes Gait Pattern: Impaired Gait Pattern: Step-to pattern;Decreased step length - left;Decreased stance time - left;Decreased hip/knee flexion - left;Decreased weight shift to left;Narrow base of support(TDWB on LLE) Gait velocity: slow Stairs / Additional Locomotion Stairs: No Wheelchair Mobility Wheelchair Mobility: Yes Wheelchair Assistance: Chartered loss adjuster: Both upper extremities Wheelchair Parts Management: Needs assistance Distance: 565f  Trunk/Postural Assessment   Cervical Assessment Cervical Assessment: Within Functional Limits Thoracic Assessment Thoracic Assessment: Within Functional Limits Lumbar Assessment Lumbar Assessment: Within Functional Limits Postural Control Postural Control: Within Functional Limits  Balance Balance Balance Assessed: Yes Static Sitting Balance Static Sitting - Balance Support: No upper extremity supported;Feet supported Static Sitting - Level of Assistance: 7: Independent Dynamic Sitting Balance Dynamic Sitting - Balance Support: During functional activity;Feet supported Dynamic Sitting - Level of Assistance: 6: Modified independent (Device/Increase time) Dynamic Sitting - Balance Activities: Lateral lean/weight shifting;Forward lean/weight  shifting;Reaching for objects Static Standing Balance Static Standing - Balance Support: During functional activity Static Standing - Level of Assistance: 6: Modified independent (Device/Increase time) Dynamic Standing Balance Dynamic Standing - Balance Support: During functional activity Dynamic Standing - Level of Assistance: 6: Modified independent (Device/Increase time) Dynamic Standing - Balance Activities: Lateral lean/weight shifting;Forward lean/weight shifting;Reaching for objects Extremity Assessment  RUE Assessment RUE Assessment: Within Functional Limits Active Range of Motion (AROM) Comments: Full AROM  General Strength Comments: BUE strength shoulder and elbow 5/5; WFL grip strength   LUE Assessment LUE Assessment: Within Functional Limits General Strength Comments: BUE strength shoulder and elbow 5/5; WFL grip strength   RLE Assessment RLE Assessment: Within Functional Limits General Strength Comments: grossly 5/5 LLE Assessment LLE Assessment: Exceptions to [facility] Active Range of Motion (AROM) Comments: able to actively flex hip ~25 and abduct ~20; knee flexion to ~60 with active assist due to muscle spasms General Strength Comments: not formally tested;  limited by pain/spasms in L hip flexors/adductors   Skilled Therapeutic Intervention: Session 1: Pt received sitting in w/c at sink; agreeable to PT. Motor, mobility, locomotion, postural control, balance, and strength assessed as mentioned above. STM and pin and stretch technique of L hip adductors to decrease pain; palpable trigger points. Pt limited by pain throughout session. Nurse notified of high pain levels; pt received pain meds at the end of the session. Pt recalled 3/3 hip precautions. Pt remained sitting in w/c at the end of the session with all needs in reach.  Session 2: Pt received sitting in w/c. Knee immobilizer discharged before start of session. S w/c propulsion to restroom. S sit><stand and stand pivot transfers throughout session. S gait x 5 ft to toilet. Pt able to perform hygiene w/ S. Pt self propelled w/c up to 515f and was also able to ambulate 274fwith S in a community environment; demonstrated the ability to navigate obstacles and people. HEP (with pictures and written instructions) provided to pt for LLE strengthening. Pt remained sitting in w/c at the end of the session with no further questions regarding d/c home and all needs in reach.  JoMartiniqueope 05/14/2018, 8:01 AM

## 2018-05-14 NOTE — Progress Notes (Signed)
Social Work Patient ID: Emma Lewis, female   DOB: Feb 24, 1951, 67 y.o.   MRN: [id number]  Met with pt to discuss team conference progress and discharge tomorrow. She has finally gotten her answer about the knee immbolizer and is happy about this. She prefers [facility] care since she had them before. Will see in am to make sure all questions answered prior to discharge.

## 2018-05-14 NOTE — Discharge Summary (Signed)
Discharge summary job [id number]

## 2018-05-14 NOTE — Discharge Summary (Addendum)
NAME: Emma Lewis, Emma Lewis MEDICAL RECORD EX:[id number] ACCOUNT [id number] DATE OF BIRTH:Aug 26, 1950 FACILITY: [facility] LOCATION: [facility] PHYSICIAN:Kemonte Ullman, MD  DISCHARGE SUMMARY  DATE OF DISCHARGE:  05/15/2018  DISCHARGE DIAGNOSES:   1.  Left total hip arthroplasty, status post left total hip arthroplasty, posterior approach with osteotomy of femur, cable fixation 10-20 19.   2.  Sequential compression devices for deep venous thrombosis prophylaxis. 3.  Pain management. 4.  Acute blood loss anemia. 5.  Hypothyroidism  6.  Hyperlipidemia 7.  Constipation. 8.  Gastroesophageal reflux disease . 9.  Prediabetes.  HOSPITAL COURSE:  This is a 67 year old right-handed female with history of prediabetes, right total hip arthroplasty, 01/21/2018, as well as left total hip replacement in 2011 with revision in 2017.  Per chart review lives with spouse, independent with  assistive device prior to admission.  Presented 04/29/2018 with increasing left hip pain.  No change with conservative care.  X-ray showed calcar resorption loosening around the acetabular shell.  Underwent left total hip arthroplasty revision, posterior  approach of all components of acetabulum and femur 05/17/2018 per Dr. Lorin Mercy.  HOSPITAL COURSE:  Pain management.  Touchdown weightbearing with posterior hip precautions.  placed on aspirin for DVT prophylaxis.  Venous Doppler studies negative.  Acute blood loss anemia 9.9 and monitored.  Therapy evaluations completed.  The patient  was admitted for comprehensive rehabilitation program.  PAST MEDICAL HISTORY:  See discharge diagnoses.  SOCIAL HISTORY:  Lives with spouse.  Used assistive device prior to admission.  FUNCTIONAL STATUS:  Upon admission to rehab services was max assist stand pivot transfers, moderate assist sit to stand, max total assist with activities of daily living.  PHYSICAL EXAMINATION: VITAL SIGNS:  Blood pressure 108/60, pulse 90, temperature 98, respirations  16. GENERAL:  Alert female in no acute distress.  Oriented x3. HEENT:  EOMs intact. NECK:  Supple, nontender, no JVD. CARDIOVASCULAR:  Rate controlled. ABDOMEN:  Soft, nontender, good bowel sounds. LUNGS:  Clear to auscultation without wheeze.   Hip incision was dressed, clean and dry, appropriately tender.  REHABILITATION HOSPITAL COURSE:  The patient was admitted to inpatient rehabilitation services.  Therapies initiated on a 3-hour daily basis, consisting of physical therapy, occupational therapy and rehabilitation nursing.  The following issues were  addressed during patient's rehabilitation stay:    Pertaining to the patient's left total hip arthroplasty, posterior approach, osteotomy of the femur cable fixation, she would remain touchdown weightbearing.  Follow up orthopedic services.  Neurovascular sensation intact.  SCDs for deep venous  thrombosis prophylaxis as well as aspirin.  Venous Doppler studies negative.  Pain management, use of oxycodone and Robaxin as needed.    Acute blood loss anemia, stable 10.4.    She remained on hormone supplement for hypothyroidism.    Prediabetes.  Hemoglobin A1c 5.8 monitored closely.    She had bouts of constipation, resolved with laxative assistance.    The patient received weekly collaborative interdisciplinary team conferences to discuss estimated length of stay, family teaching, any barriers to her discharge.  Supervision stand pivot transfers with rolling walker to the bed, supervision bed mobility  without bed rails.  She was able to follow her hip precautions,  supervision stand pivot transfers, rolling walker to wheelchair and supervision wheelchair mobility, ambulating 100 feet rolling walker.Knee immobilizer discontinued per orthopedic services. Gathered belongings for activities of daily living  and homemaking.  Full family teaching was completed and planned discharge to home.  DISCHARGE MEDICATIONS:  Included Zovirax 400 mg p.o.  b.i.d., aspirin  325 mg p.o. daily, vitamin D 2000 units b.i.d., Voltaren gel 4 times daily to affected area, Colace 100 mg p.o. t.i.d., Synthroid 100 mcg p.o. daily, Antivert 25 mg daily, Robaxin 750  mg p.o. q.i.d., Protonix 40 mg p.o. daily, Zocor 40 mg p.o. at bedtime, oxycodone 5-10 mg every 6 hours as needed for pain.  DIET:  Her diet was regular.    FOLLOWUP:  She would follow up with Dr. Delice Lesch at the outpatient rehab service office as directed; Dr. Rodell Perna, call for appointment; Dr. [name], medical management.  SPECIAL INSTRUCTIONS:  Patient will continue touchdown weightbearing left lower extremity.  Posterior hip precautions.  Patient seen and examined by me on day of discharge.  AN/NUANCE D:05/14/2018 T:05/14/2018 JOB:[id number]

## 2018-05-14 NOTE — Progress Notes (Addendum)
Patient states she had felt a "popping sound and shifted to her left knee area after she ambulated with NT from door way of BR into BR using her walker,around 9pm on 05/13/18. Request for Dr to examine her on his rounds this morning. Medicated for pain, repositioned, monitor, assisted, call bell within reach

## 2018-05-14 NOTE — Progress Notes (Signed)
Occupational Therapy Session Note  Patient Details  Name: Emma Lewis MRN: [id number] Date of Birth: 12-01-1950  Today's Date: 05/14/2018 OT Individual Time: 6789-3810 OT Individual Time Calculation (min): 75 min    Short Term Goals: Week 2:  OT Short Term Goal 1 (Week 2): STG=LTG due to LOS  Skilled Therapeutic Interventions/Progress Updates:    Pt supine in bed upon entry agreeable to therapy session, however reports of mild pain in L hip  with RN present. Pt with report of pain half way through session 10/10 in L hip, RN notified of change in pain level and distributed meds by end of session. Pt performed ambulation and functional transfers with mod I this session except for shower transfer requiring supervision for safety. Pt performed all bathing tasks while seated including hygiene for decreased risk of fall. Pt then ambulated to EOB and performed dressing tasks seated EOB demonstrating good carryover of AE use and adherence to total hip precautions. Pt performed grooming at sink ~5 mins then ambulated to w/c. Discussion with pt during remaining tasks regarding new daily schedule at home specifically when pain is more present- pt with reports of using w/c for mobility on those days and carryover regarding pacing and energy conservation techniques reported. Pt left seated in w/c with all needs in reach.   Therapy Documentation Precautions:  Precautions Precautions: Posterior Hip Precaution Booklet Issued: Yes (comment) Precaution Comments: Reviewed posterior hip precautions with patient- pt able to recall all  Required Braces or Orthoses: Knee Immobilizer - Left Restrictions Weight Bearing Restrictions: Yes LLE Weight Bearing: Touchdown weight bearing ADL: ADL Grooming: Modified independent Where Assessed-Grooming: Standing at sink Upper Body Bathing: Modified independent Where Assessed-Upper Body Bathing: Shower Lower Body Bathing: Modified independent Where Assessed-Lower Body  Bathing: Shower Upper Body Dressing: Independent Where Assessed-Upper Body Dressing: Edge of bed Lower Body Dressing: Modified independent Where Assessed-Lower Body Dressing: Edge of bed Toileting: Modified independent Where Assessed-Toileting: Other (Comment)(BSC over toilet ) Toilet Transfer: Modified independent Toilet Transfer Method: Counselling psychologist: Energy manager: Close supervision Social research officer, government Method: Heritage manager: Grab bars, Transfer tub bench   Therapy/Group: Individual Therapy  Lun Muro 05/14/2018, 8:31 AM

## 2018-05-14 NOTE — Progress Notes (Signed)
Emma Lewis  Subjective/Complaints: Patient seen sitting up at the edge of her bed working with therapies.  She states she slept well overnight.  She has questions about her knee immobilizer.  Have attempted to call surgery x2 without callback.  Will attempt again today.  ROS: Denies CP, SOB, nausea, vomiting, diarrhea.  Objective: Vital Signs: Blood pressure 120/61, pulse 77, temperature 98 F (36.7 C), temperature source Oral, resp. rate 18, height 5\' 5"  (1.651 m), weight 86.3 kg, SpO2 98 %. No results found. No results for input(s): WBC, HGB, HCT, PLT in the last 72 hours. Recent Labs    05/14/18 0429  NA 139  K 4.1  CL 107  CO2 26  GLUCOSE 100*  BUN 11  CREATININE 0.73  CALCIUM 9.3    Physical Exam: BP 120/61 (BP Location: Right Arm)   Pulse 77   Temp 98 F (36.7 C) (Oral)   Resp 18   Ht 5\' 5"  (1.651 m)   Wt 86.3 kg   SpO2 98%   BMI 31.66 kg/m  Constitutional: She appears well-developed. No distress.  HENT: Normocephalic.  Atraumatic. Eyes: EOMI.  No discharge. Cardiovascular: RRR.  No JVD. Respiratory: Effort normal.  Clear. GI: Bowel sounds positive.  She exhibits no distension.  Musculoskeletal: Mild left hip tenderness, unchanged Left hip edema and tenderness Neurological: She is alert and oriented  Follows full commands.  Motor:  RLE 4+/5 prox to distal, unchanged LLE 2--3--/5 HF, 3+/5 KE (pain inhibition), 4/5 ADF, stable Skin: Incision with approximately 1 inch area proximal opening, staples removed  Assessment/Plan: 1. 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  Physiatrist is providing close team supervision and 24 hour management of active medical problems listed below.  Physiatrist and rehab team continue to assess barriers to  discharge/monitor patient progress toward functional and medical goals  Care Tool:  Bathing    Body parts bathed by patient: Right arm, Left arm, Chest, Abdomen, Front perineal area, Right upper leg, Left upper leg, Right lower leg, Left lower leg, Face, Buttocks   Body parts bathed by helper: Buttocks     Bathing assist Assist Level: Independent with assistive device     Upper Body Dressing/Undressing Upper body dressing   What is the patient wearing?: Pull over shirt, Bra    Upper body assist Assist Level: Independent    Lower Body Dressing/Undressing Lower body dressing      What is the patient wearing?: Underwear/pull up, Pants, Orthosis     Lower body assist Assist for lower body dressing: Independent with assitive device     Toileting Toileting Toileting Activity did not occur (Clothing management and hygiene only): N/A (no void or bm)  Toileting assist Assist for toileting: Independent with assistive device     Transfers Chair/bed transfer  Transfers assist  Chair/bed transfer activity did not occur: Safety/medical concerns  Chair/bed transfer assist level: Supervision/Verbal cueing     Locomotion Ambulation   Ambulation assist   Ambulation activity did not occur: Refused  Assist level: Supervision/Verbal cueing Assistive device: Walker-rolling Max distance: 25   Walk 10 feet activity   Assist  Walk 10 feet activity did not occur: Safety/medical concerns  Assist level: Minimal Assistance - Patient > 75% Assistive device: Walker-rolling   Walk 50 feet activity   Assist Walk 50 feet with 2 turns  activity did not occur: Safety/medical concerns  Assist level: Supervision/Verbal cueing Assistive device: Walker-rolling    Walk 150 feet activity   Assist Walk 150 feet activity did not occur: Safety/medical concerns         Walk 10 feet on uneven surface  activity   Assist Walk 10 feet on uneven surfaces activity did not occur:  Safety/medical concerns   Assist level: Supervision/Verbal cueing Assistive device: Aeronautical engineer Will patient use wheelchair at discharge?: Yes Type of Wheelchair: Manual    Wheelchair assist level: Supervision/Verbal cueing Max wheelchair distance: 150'    Wheelchair 50 feet with 2 turns activity    Assist        Assist Level: Supervision/Verbal cueing   Wheelchair 150 feet activity     Assist     Assist Level: Supervision/Verbal cueing      Medical Problem List and Plan:  1. 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 Touchdown weightbearing as well as history of right total hip arthroplasty   Continue CIR   DCed staples  Plan for d/c tomorrow  Will see patient for transitional care management in 1-2 weeks post-discharge  Have requested Ortho to evaluate patient for recommendations regarding knee immobilizer as well as incisional opening 2. DVT Prophylaxis/Anticoagulation: SCDs.   Recent Venous Doppler studies negative  3. Pain Management: Oxycodone as needed   ice to left hip   Robaxin increased and scheduled on 10/10  K pad ordered  X-ray left knee, reviewed, mild OA  Controlled on 10/17 4. Mood: Provide emotional support  5. Neuropsych: This patient is capable of making decisions on her own behalf.  6. Skin/Wound Care: Routine skin checks  7. Fluids/Electrolytes/Nutrition: Routine in and outs   BMP within acceptable range on 10/17 8. Acute blood loss anemia.    Hemoglobin 10.4 on 10/14 9. Hypothyroidism. Synthroid  10. Hyperlipidemia. Zocor  11. Constipation. scheduled bowel meds senna qhs, colace TID 12. GERD. Protonix   Controlled on 10/15 13. Prediabetes. Hemoglobin A1c 5.8   Relatively controlled on 10/17 14. Leukocytosis: Resolved  Afebrile 15.  Transaminitis:  Resolved  LFTs within normal limits on 10/17  Continue to monitor 16.  Hypoalbuminemia  Supplement initiated on 10/7  Improving on 10/17 17.  LEFT knee OA  X-ray reviewed, showing medial compartment OA  Added diclofenac  LOS: 13 days A FACE TO FACE EVALUATION WAS PERFORMED  Ferrin Liebig Lorie Phenix 05/14/2018, 8:45 AM

## 2018-05-14 NOTE — Progress Notes (Signed)
Occupational Therapy Discharge Summary  Patient Details  Name: Emma Lewis MRN: [id number] Date of Birth: 1950/11/19  Today's Date: 05/14/2018 OT Individual Time: 0349-1791 OT Individual Time Calculation (min): 75 min    Patient has met 9 of 9 long term goals due to improved activity tolerance, improved balance, postural control, ability to compensate for deficits, improved awareness and improved coordination.  Patient to discharge at overall Modified Independent level with RW and supervision level for shower transfer.  Patient's care partner is independent to provide the necessary physical assistance at discharge.   Pt has demonstrated great improvements during length of stay with education provided regarding the following: recall and adherence of total hip precautions, AE use during self care tasks, energy conservation techniques, RW safety and home/community mobility with RW. Pt has performed shower transfer simulation with TTB and transfer of BLE's over shower stall ledge and is at supervision level for d/c home. Pt reports her spouse is feeling comfortable with this level of assistance needed for task. Pt has reached mod I level for remaining goals including toileting/toilet transfer, dressing and grooming.    Recommendation:  Patient with no further OT needs at this time.   Equipment: TTB   Reasons for discharge: treatment goals met and discharge from hospital  Patient/family agrees with progress made and goals achieved: Yes  OT Discharge Precautions/Restrictions  Precautions Precautions: Posterior Hip Precaution Comments: Reviewed posterior hip precautions with patient- pt able to recall all  Required Braces or Orthoses: Knee Immobilizer - Left Restrictions Weight Bearing Restrictions: Yes LLE Weight Bearing: Touchdown weight bearing ADL ADL Grooming: Modified independent Where Assessed-Grooming: Standing at sink Upper Body Bathing: Modified independent Where  Assessed-Upper Body Bathing: Shower Lower Body Bathing: Modified independent Where Assessed-Lower Body Bathing: Shower Upper Body Dressing: Independent Where Assessed-Upper Body Dressing: Edge of bed Lower Body Dressing: Modified independent Where Assessed-Lower Body Dressing: Edge of bed Toileting: Modified independent Where Assessed-Toileting: Other (Comment)(BSC over toilet ) Toilet Transfer: Modified independent Toilet Transfer Method: Counselling psychologist: Energy manager: Close supervision Social research officer, government Method: Heritage manager: Grab bars, Radio broadcast assistant Vision Baseline Vision/History: No visual deficits Patient Visual Report: No change from baseline Vision Assessment?: No apparent visual deficits Perception  Perception: Within Functional Limits Praxis Praxis: Intact Cognition Overall Cognitive Status: Within Functional Limits for tasks assessed Arousal/Alertness: Awake/alert Orientation Level: Oriented X4 Memory: Appears intact Awareness: Appears intact Problem Solving: Appears intact Safety/Judgment: Appears intact Sensation Sensation Light Touch: Appears Intact Coordination Gross Motor Movements are Fluid and Coordinated: Yes Fine Motor Movements are Fluid and Coordinated: Yes Motor  Motor Motor: Within Functional Limits Trunk/Postural Assessment  Cervical Assessment Cervical Assessment: Within Functional Limits Thoracic Assessment Thoracic Assessment: Within Functional Limits Lumbar Assessment Lumbar Assessment: Within Functional Limits Postural Control Postural Control: Within Functional Limits  Balance Balance Balance Assessed: Yes Static Sitting Balance Static Sitting - Balance Support: Feet supported Static Sitting - Level of Assistance: 7: Independent Dynamic Sitting Balance Dynamic Sitting - Balance Support: During functional activity Dynamic Sitting - Level of Assistance: 6:  Modified independent (Device/Increase time) Static Standing Balance Static Standing - Balance Support: During functional activity Static Standing - Level of Assistance: 6: Modified independent (Device/Increase time) Dynamic Standing Balance Dynamic Standing - Balance Support: During functional activity Dynamic Standing - Level of Assistance: 6: Modified independent (Device/Increase time) Dynamic Standing - Comments: Performing self care tasks in standing  Extremity/Trunk Assessment RUE Assessment RUE Assessment: Within Functional Limits Active Range of Motion (AROM)  Comments: Full AROM  General Strength Comments: BUE strength shoulder and elbow 5/5; WFL grip strength   LUE Assessment LUE Assessment: Within Functional Limits General Strength Comments: BUE strength shoulder and elbow 5/5; WFL grip strength     Emma Lewis 05/14/2018, 8:31 AM

## 2018-05-15 ENCOUNTER — Telehealth (INDEPENDENT_AMBULATORY_CARE_PROVIDER_SITE_OTHER): Payer: Self-pay | Admitting: Orthopaedic Surgery

## 2018-05-15 MED ORDER — DOCUSATE SODIUM 100 MG PO CAPS: 100.0000 mg | ORAL_CAPSULE | Freq: Three times a day (TID) | ORAL | 0 refills | Status: DC

## 2018-05-15 MED ORDER — ACETAMINOPHEN 325 MG PO TABS: 325.0000 mg | ORAL_TABLET | Freq: Four times a day (QID) | ORAL | Status: DC | PRN

## 2018-05-15 MED ORDER — SIMVASTATIN 40 MG PO TABS: 40.0000 mg | ORAL_TABLET | Freq: Every day | ORAL | 0 refills | Status: DC

## 2018-05-15 MED ORDER — ACYCLOVIR 400 MG PO TABS: 400.0000 mg | ORAL_TABLET | Freq: Two times a day (BID) | ORAL | 0 refills | Status: AC

## 2018-05-15 MED ORDER — DICLOFENAC SODIUM 1 % TD GEL: 2.0000 g | Freq: Four times a day (QID) | TRANSDERMAL | 1 refills | Status: DC

## 2018-05-15 MED ORDER — LEVOTHYROXINE SODIUM 100 MCG PO TABS: 100.0000 ug | ORAL_TABLET | Freq: Every day | ORAL | 0 refills | Status: DC

## 2018-05-15 MED ORDER — METHOCARBAMOL 750 MG PO TABS: 750.0000 mg | ORAL_TABLET | Freq: Four times a day (QID) | ORAL | 0 refills | Status: DC

## 2018-05-15 MED ORDER — OXYCODONE HCL 5 MG PO TABS: 5.0000 mg | ORAL_TABLET | Freq: Four times a day (QID) | ORAL | 0 refills | Status: DC | PRN

## 2018-05-15 NOTE — Discharge Instructions (Signed)
Inpatient Rehab Discharge Instructions  Emma Lewis Discharge date and time: No discharge date for patient encounter.   Activities/Precautions/ Functional Status: Activity: Touchdown weightbearing left lower extremity with posterior hip precautions Diet: regular diet Wound Care: keep wound clean and dry Functional status:  ___ No restrictions     ___ Walk up steps independently ___ 24/7 supervision/assistance   ___ Walk up steps with assistance ___ Intermittent supervision/assistance  ___ Bathe/dress independently ___ Walk with walker     _x__ Bathe/dress with assistance ___ Walk Independently    ___ Shower independently ___ Walk with assistance    ___ Shower with assistance ___ No alcohol     ___ Return to work/school ________  Special Instructions: No driving   COMMUNITY REFERRALS UPON DISCHARGE:    Home Health:   PT & OT   [facility]   Date of last service:05/15/2018  Medical Equipment/Items [place]   [phone]    My questions have been answered and I understand these instructions. I will adhere to these goals and the provided educational materials after my discharge from the hospital.  Patient/Caregiver Signature _______________________________ Date __________  Clinician Signature _______________________________________ Date __________  Please bring this form and your medication list with you to all your follow-up doctor's appointments.

## 2018-05-15 NOTE — Telephone Encounter (Signed)
Fyi..  I called patient and advised Dr. Lorin Mercy is in surgery this morning but that I would get him the message. She states that Dr. Posey Pronto told her he has been trying to get in touch with Dr. Lorin Mercy, as well as the nurses.    Phone message sent to Dr. Lorin Mercy.

## 2018-05-15 NOTE — Progress Notes (Signed)
Social Work  Discharge Note  The overall goal for the admission was met for:   Discharge location: Yes-HOME WITH HUSBAND WHO WILL BE WITH SHORT TIME  Length of Stay: Yes-15 DAYS  Discharge activity level: Yes-SUPERVISION-MOD/I LEVEL  Home/community participation: Yes  Services provided included: MD, RD, PT, OT, RN, CM, Pharmacy and SW  Financial Services: Medicare and Private Insurance: [place]  Follow-up services arranged: Home Health: [facility], DME: [place] and Patient/Family request agency HH: PREF USED BEFORE, DME: NO PREF  Comments (or additional information):PT HAS DONE VERY WELL AND REACHED HER GOALS QUICKLY. READY TO GO HOME.  Patient/Family verbalized understanding of follow-up arrangements: Yes  Individual responsible for coordination of the follow-up plan: SELF & ISAAC-HUSBAND  Confirmed correct DME delivered: Emma Lewis 05/15/2018    Emma Lewis

## 2018-05-15 NOTE — Telephone Encounter (Signed)
Patient called stating that the nurses and PT have been trying to get a hold of Dr. Lorin Mercy.  Patient is frustrated and would like to speak to Dr. Lorin Mercy.  She is ready to get out of the hospital.  CB#[phone].  Thank you.

## 2018-05-15 NOTE — Plan of Care (Signed)
Pt has went over all instructions with Pa. No concerns were mentioned to nurse during discharged. PRN was given and effective before leaving. Pt is leaving facility with Husband at 12:24 pm in her personal new wheelchair. Grab bar and all other devices were checked off by husband and confirmed to be loaded into car already. Husband assisted Emma Lewis with getting into car as nurse loaded the rest of the personal items.

## 2018-05-15 NOTE — Telephone Encounter (Signed)
I called, discussed.  

## 2018-05-16 DIAGNOSIS — H8110 Benign paroxysmal vertigo, unspecified ear: Secondary | ICD-10-CM | POA: Diagnosis not present

## 2018-05-16 DIAGNOSIS — T84031D Mechanical loosening of internal left hip prosthetic joint, subsequent encounter: Secondary | ICD-10-CM | POA: Diagnosis not present

## 2018-05-16 DIAGNOSIS — M170 Bilateral primary osteoarthritis of knee: Secondary | ICD-10-CM | POA: Diagnosis not present

## 2018-05-16 DIAGNOSIS — N189 Chronic kidney disease, unspecified: Secondary | ICD-10-CM | POA: Diagnosis not present

## 2018-05-16 DIAGNOSIS — T8452XD Infection and inflammatory reaction due to internal left hip prosthesis, subsequent encounter: Secondary | ICD-10-CM | POA: Diagnosis not present

## 2018-05-16 DIAGNOSIS — I129 Hypertensive chronic kidney disease with stage 1 through stage 4 chronic kidney disease, or unspecified chronic kidney disease: Secondary | ICD-10-CM | POA: Diagnosis not present

## 2018-05-18 DIAGNOSIS — T84031D Mechanical loosening of internal left hip prosthetic joint, subsequent encounter: Secondary | ICD-10-CM | POA: Diagnosis not present

## 2018-05-18 DIAGNOSIS — I129 Hypertensive chronic kidney disease with stage 1 through stage 4 chronic kidney disease, or unspecified chronic kidney disease: Secondary | ICD-10-CM | POA: Diagnosis not present

## 2018-05-18 DIAGNOSIS — H8110 Benign paroxysmal vertigo, unspecified ear: Secondary | ICD-10-CM | POA: Diagnosis not present

## 2018-05-18 DIAGNOSIS — T8452XD Infection and inflammatory reaction due to internal left hip prosthesis, subsequent encounter: Secondary | ICD-10-CM | POA: Diagnosis not present

## 2018-05-18 DIAGNOSIS — M170 Bilateral primary osteoarthritis of knee: Secondary | ICD-10-CM | POA: Diagnosis not present

## 2018-05-18 DIAGNOSIS — N189 Chronic kidney disease, unspecified: Secondary | ICD-10-CM | POA: Diagnosis not present

## 2018-05-19 ENCOUNTER — Telehealth: Payer: Self-pay

## 2018-05-19 ENCOUNTER — Ambulatory Visit (INDEPENDENT_AMBULATORY_CARE_PROVIDER_SITE_OTHER): Payer: Medicare Other | Admitting: Orthopaedic Surgery

## 2018-05-19 NOTE — Telephone Encounter (Addendum)
Transitional Care call Patient name: Emma Lewis) DOB: (Nov 28, 1950) 1. Are you/is patient experiencing any problems since coming home? (NO) a. Are there any questions regarding any aspect of care? (NA) 2. Are there any questions regarding medications administration/dosing? (NO) a. Are meds being taken as prescribed? (YES) b. "Patient should review meds with caller to confirm"  3. Have there been any falls? (NO) 4. Has Home Health been to the house and/or have they contacted you? (YES) a. If not, have you tried to contact them? (NA) b. Can we help you contact them? (NA) 5. Are bowels and bladder emptying properly? (YES) a. Are there any unexpected incontinence issues? (NA) b. If applicable, is patient following bowel/bladder programs? (NA) 6. Any fevers, problems with breathing, unexpected pain? (NO) 7. Are there any skin problems or new areas of breakdown? (NO) 8. Has the patient/family member arranged specialty MD follow up (ie cardiology/neurology/renal/surgical/etc.)?  (YES) a. Can we help arrange? (NA) 9. Does the patient need any other services or support that we can help arrange? (NO) 10. Are caregivers following through as expected in assisting the patient? (YES) Has the patient quit smoking, drinking alcohol, or using drugs as recommended? (NA) Appointment date/time (05-28-2018 / 11:40am), arrive time (11:20am) and who it is with here (Dr. Posey Pronto) [place]

## 2018-05-20 DIAGNOSIS — I129 Hypertensive chronic kidney disease with stage 1 through stage 4 chronic kidney disease, or unspecified chronic kidney disease: Secondary | ICD-10-CM | POA: Diagnosis not present

## 2018-05-20 DIAGNOSIS — H8110 Benign paroxysmal vertigo, unspecified ear: Secondary | ICD-10-CM | POA: Diagnosis not present

## 2018-05-20 DIAGNOSIS — T8452XD Infection and inflammatory reaction due to internal left hip prosthesis, subsequent encounter: Secondary | ICD-10-CM | POA: Diagnosis not present

## 2018-05-20 DIAGNOSIS — M170 Bilateral primary osteoarthritis of knee: Secondary | ICD-10-CM | POA: Diagnosis not present

## 2018-05-20 DIAGNOSIS — T84031D Mechanical loosening of internal left hip prosthetic joint, subsequent encounter: Secondary | ICD-10-CM | POA: Diagnosis not present

## 2018-05-20 DIAGNOSIS — N189 Chronic kidney disease, unspecified: Secondary | ICD-10-CM | POA: Diagnosis not present

## 2018-05-22 DIAGNOSIS — M170 Bilateral primary osteoarthritis of knee: Secondary | ICD-10-CM | POA: Diagnosis not present

## 2018-05-22 DIAGNOSIS — T84031D Mechanical loosening of internal left hip prosthetic joint, subsequent encounter: Secondary | ICD-10-CM | POA: Diagnosis not present

## 2018-05-22 DIAGNOSIS — H8110 Benign paroxysmal vertigo, unspecified ear: Secondary | ICD-10-CM | POA: Diagnosis not present

## 2018-05-22 DIAGNOSIS — T8452XD Infection and inflammatory reaction due to internal left hip prosthesis, subsequent encounter: Secondary | ICD-10-CM | POA: Diagnosis not present

## 2018-05-22 DIAGNOSIS — N189 Chronic kidney disease, unspecified: Secondary | ICD-10-CM | POA: Diagnosis not present

## 2018-05-22 DIAGNOSIS — I129 Hypertensive chronic kidney disease with stage 1 through stage 4 chronic kidney disease, or unspecified chronic kidney disease: Secondary | ICD-10-CM | POA: Diagnosis not present

## 2018-05-25 DIAGNOSIS — H8110 Benign paroxysmal vertigo, unspecified ear: Secondary | ICD-10-CM | POA: Diagnosis not present

## 2018-05-25 DIAGNOSIS — N189 Chronic kidney disease, unspecified: Secondary | ICD-10-CM | POA: Diagnosis not present

## 2018-05-25 DIAGNOSIS — I129 Hypertensive chronic kidney disease with stage 1 through stage 4 chronic kidney disease, or unspecified chronic kidney disease: Secondary | ICD-10-CM | POA: Diagnosis not present

## 2018-05-25 DIAGNOSIS — T8452XD Infection and inflammatory reaction due to internal left hip prosthesis, subsequent encounter: Secondary | ICD-10-CM | POA: Diagnosis not present

## 2018-05-25 DIAGNOSIS — M170 Bilateral primary osteoarthritis of knee: Secondary | ICD-10-CM | POA: Diagnosis not present

## 2018-05-25 DIAGNOSIS — T84031D Mechanical loosening of internal left hip prosthetic joint, subsequent encounter: Secondary | ICD-10-CM | POA: Diagnosis not present

## 2018-05-27 DIAGNOSIS — N189 Chronic kidney disease, unspecified: Secondary | ICD-10-CM | POA: Diagnosis not present

## 2018-05-27 DIAGNOSIS — H8110 Benign paroxysmal vertigo, unspecified ear: Secondary | ICD-10-CM | POA: Diagnosis not present

## 2018-05-27 DIAGNOSIS — M170 Bilateral primary osteoarthritis of knee: Secondary | ICD-10-CM | POA: Diagnosis not present

## 2018-05-27 DIAGNOSIS — T84031D Mechanical loosening of internal left hip prosthetic joint, subsequent encounter: Secondary | ICD-10-CM | POA: Diagnosis not present

## 2018-05-27 DIAGNOSIS — I129 Hypertensive chronic kidney disease with stage 1 through stage 4 chronic kidney disease, or unspecified chronic kidney disease: Secondary | ICD-10-CM | POA: Diagnosis not present

## 2018-05-27 DIAGNOSIS — T8452XD Infection and inflammatory reaction due to internal left hip prosthesis, subsequent encounter: Secondary | ICD-10-CM | POA: Diagnosis not present

## 2018-05-28 ENCOUNTER — Encounter: Payer: Medicare Other | Admitting: Physical Medicine & Rehabilitation

## 2018-05-28 DIAGNOSIS — M170 Bilateral primary osteoarthritis of knee: Secondary | ICD-10-CM | POA: Diagnosis not present

## 2018-05-28 DIAGNOSIS — H8110 Benign paroxysmal vertigo, unspecified ear: Secondary | ICD-10-CM | POA: Diagnosis not present

## 2018-05-28 DIAGNOSIS — N189 Chronic kidney disease, unspecified: Secondary | ICD-10-CM | POA: Diagnosis not present

## 2018-05-28 DIAGNOSIS — T84031D Mechanical loosening of internal left hip prosthetic joint, subsequent encounter: Secondary | ICD-10-CM | POA: Diagnosis not present

## 2018-05-28 DIAGNOSIS — T8452XD Infection and inflammatory reaction due to internal left hip prosthesis, subsequent encounter: Secondary | ICD-10-CM | POA: Diagnosis not present

## 2018-05-28 DIAGNOSIS — I129 Hypertensive chronic kidney disease with stage 1 through stage 4 chronic kidney disease, or unspecified chronic kidney disease: Secondary | ICD-10-CM | POA: Diagnosis not present

## 2018-05-29 ENCOUNTER — Ambulatory Visit (INDEPENDENT_AMBULATORY_CARE_PROVIDER_SITE_OTHER): Payer: Medicare Other

## 2018-05-29 ENCOUNTER — Other Ambulatory Visit: Payer: Self-pay

## 2018-05-29 ENCOUNTER — Ambulatory Visit (INDEPENDENT_AMBULATORY_CARE_PROVIDER_SITE_OTHER): Payer: Medicare Other | Admitting: Surgery

## 2018-05-29 ENCOUNTER — Inpatient Hospital Stay (HOSPITAL_COMMUNITY)
Admission: AD | Admit: 2018-05-29 | Discharge: 2018-06-06 | DRG: 466 | Disposition: A | Discharge status: Home Health | Payer: Medicare Other | Attending: Orthopaedic Surgery | Admitting: Orthopaedic Surgery

## 2018-05-29 ENCOUNTER — Ambulatory Visit (INDEPENDENT_AMBULATORY_CARE_PROVIDER_SITE_OTHER): Payer: Self-pay

## 2018-05-29 ENCOUNTER — Encounter (INDEPENDENT_AMBULATORY_CARE_PROVIDER_SITE_OTHER): Payer: Self-pay

## 2018-05-29 ENCOUNTER — Encounter: Payer: Medicare Other | Admitting: Physical Medicine & Rehabilitation

## 2018-05-29 ENCOUNTER — Encounter (INDEPENDENT_AMBULATORY_CARE_PROVIDER_SITE_OTHER): Payer: Self-pay | Admitting: Surgery

## 2018-05-29 DIAGNOSIS — D62 Acute posthemorrhagic anemia: Secondary | ICD-10-CM | POA: Diagnosis not present

## 2018-05-29 DIAGNOSIS — Z981 Arthrodesis status: Secondary | ICD-10-CM | POA: Diagnosis not present

## 2018-05-29 DIAGNOSIS — Z96643 Presence of artificial hip joint, bilateral: Secondary | ICD-10-CM | POA: Diagnosis present

## 2018-05-29 DIAGNOSIS — G8929 Other chronic pain: Secondary | ICD-10-CM | POA: Diagnosis present

## 2018-05-29 DIAGNOSIS — S73005A Unspecified dislocation of left hip, initial encounter: Secondary | ICD-10-CM | POA: Diagnosis present

## 2018-05-29 DIAGNOSIS — Z961 Presence of intraocular lens: Secondary | ICD-10-CM | POA: Diagnosis present

## 2018-05-29 DIAGNOSIS — T84021A Dislocation of internal left hip prosthesis, initial encounter: Principal | ICD-10-CM | POA: Diagnosis present

## 2018-05-29 DIAGNOSIS — Z9841 Cataract extraction status, right eye: Secondary | ICD-10-CM

## 2018-05-29 DIAGNOSIS — M5136 Other intervertebral disc degeneration, lumbar region: Secondary | ICD-10-CM | POA: Diagnosis present

## 2018-05-29 DIAGNOSIS — K760 Fatty (change of) liver, not elsewhere classified: Secondary | ICD-10-CM | POA: Diagnosis present

## 2018-05-29 DIAGNOSIS — Z96642 Presence of left artificial hip joint: Secondary | ICD-10-CM | POA: Diagnosis not present

## 2018-05-29 DIAGNOSIS — Z882 Allergy status to sulfonamides status: Secondary | ICD-10-CM | POA: Diagnosis not present

## 2018-05-29 DIAGNOSIS — K219 Gastro-esophageal reflux disease without esophagitis: Secondary | ICD-10-CM | POA: Diagnosis present

## 2018-05-29 DIAGNOSIS — S72302A Unspecified fracture of shaft of left femur, initial encounter for closed fracture: Secondary | ICD-10-CM | POA: Diagnosis present

## 2018-05-29 DIAGNOSIS — M170 Bilateral primary osteoarthritis of knee: Secondary | ICD-10-CM | POA: Diagnosis present

## 2018-05-29 DIAGNOSIS — E440 Moderate protein-calorie malnutrition: Secondary | ICD-10-CM | POA: Diagnosis present

## 2018-05-29 DIAGNOSIS — R7303 Prediabetes: Secondary | ICD-10-CM | POA: Diagnosis present

## 2018-05-29 DIAGNOSIS — M8580 Other specified disorders of bone density and structure, unspecified site: Secondary | ICD-10-CM | POA: Diagnosis present

## 2018-05-29 DIAGNOSIS — Z96649 Presence of unspecified artificial hip joint: Secondary | ICD-10-CM

## 2018-05-29 DIAGNOSIS — M9702XA Periprosthetic fracture around internal prosthetic left hip joint, initial encounter: Secondary | ICD-10-CM | POA: Diagnosis present

## 2018-05-29 DIAGNOSIS — Z419 Encounter for procedure for purposes other than remedying health state, unspecified: Secondary | ICD-10-CM

## 2018-05-29 DIAGNOSIS — Z9842 Cataract extraction status, left eye: Secondary | ICD-10-CM | POA: Diagnosis not present

## 2018-05-29 DIAGNOSIS — D649 Anemia, unspecified: Secondary | ICD-10-CM | POA: Diagnosis present

## 2018-05-29 DIAGNOSIS — Z888 Allergy status to other drugs, medicaments and biological substances status: Secondary | ICD-10-CM

## 2018-05-29 DIAGNOSIS — R52 Pain, unspecified: Secondary | ICD-10-CM | POA: Diagnosis not present

## 2018-05-29 DIAGNOSIS — E039 Hypothyroidism, unspecified: Secondary | ICD-10-CM | POA: Diagnosis present

## 2018-05-29 DIAGNOSIS — X58XXXA Exposure to other specified factors, initial encounter: Secondary | ICD-10-CM | POA: Diagnosis present

## 2018-05-29 DIAGNOSIS — E785 Hyperlipidemia, unspecified: Secondary | ICD-10-CM | POA: Diagnosis present

## 2018-05-29 DIAGNOSIS — M978XXA Periprosthetic fracture around other internal prosthetic joint, initial encounter: Secondary | ICD-10-CM

## 2018-05-29 DIAGNOSIS — M9702XD Periprosthetic fracture around internal prosthetic left hip joint, subsequent encounter: Secondary | ICD-10-CM | POA: Diagnosis not present

## 2018-05-29 DIAGNOSIS — S79929A Unspecified injury of unspecified thigh, initial encounter: Secondary | ICD-10-CM | POA: Diagnosis not present

## 2018-05-29 DIAGNOSIS — Y792 Prosthetic and other implants, materials and accessory orthopedic devices associated with adverse incidents: Secondary | ICD-10-CM | POA: Diagnosis present

## 2018-05-29 DIAGNOSIS — S72143A Displaced intertrochanteric fracture of unspecified femur, initial encounter for closed fracture: Secondary | ICD-10-CM | POA: Diagnosis not present

## 2018-05-29 DIAGNOSIS — M978XXD Periprosthetic fracture around other internal prosthetic joint, subsequent encounter: Secondary | ICD-10-CM | POA: Diagnosis not present

## 2018-05-29 DIAGNOSIS — Z90722 Acquired absence of ovaries, bilateral: Secondary | ICD-10-CM | POA: Diagnosis not present

## 2018-05-29 DIAGNOSIS — I10 Essential (primary) hypertension: Secondary | ICD-10-CM | POA: Diagnosis present

## 2018-05-29 DIAGNOSIS — Z807 Family history of other malignant neoplasms of lymphoid, hematopoietic and related tissues: Secondary | ICD-10-CM

## 2018-05-29 DIAGNOSIS — Z09 Encounter for follow-up examination after completed treatment for conditions other than malignant neoplasm: Secondary | ICD-10-CM

## 2018-05-29 DIAGNOSIS — G8918 Other acute postprocedural pain: Secondary | ICD-10-CM

## 2018-05-29 LAB — CBC
HCT: 34.2 % — ABNORMAL LOW (ref 36.0–46.0)
HEMATOCRIT: 34.3 % — AB (ref 36.0–46.0)
HEMOGLOBIN: 10.1 g/dL — AB (ref 12.0–15.0)
Hemoglobin: 10.6 g/dL — ABNORMAL LOW (ref 12.0–15.0)
MCH: 28.3 pg (ref 26.0–34.0)
MCH: 27.4 pg (ref 26.0–34.0)
MCHC: 30.9 g/dL (ref 30.0–36.0)
MCHC: 29.5 g/dL — AB (ref 30.0–36.0)
MCV: 91.5 fL (ref 80.0–100.0)
MCV: 92.9 fL (ref 80.0–100.0)
Platelets: 249 10*3/uL (ref 150–400)
Platelets: 275 10*3/uL (ref 150–400)
RBC: 3.75 MIL/uL — ABNORMAL LOW (ref 3.87–5.11)
RBC: 3.68 MIL/uL — AB (ref 3.87–5.11)
RDW: 14.9 % (ref 11.5–15.5)
RDW: 14.9 % (ref 11.5–15.5)
WBC: 7.9 10*3/uL (ref 4.0–10.5)
WBC: 6.3 10*3/uL (ref 4.0–10.5)
nRBC: 0 % (ref 0.0–0.2)
nRBC: 0 % (ref 0.0–0.2)

## 2018-05-29 LAB — COMPREHENSIVE METABOLIC PANEL
ALK PHOS: 276 U/L — AB (ref 38–126)
ALT: 46 U/L — AB (ref 0–44)
AST: 63 U/L — AB (ref 15–41)
Albumin: 3.3 g/dL — ABNORMAL LOW (ref 3.5–5.0)
Anion gap: 9 (ref 5–15)
BUN: 14 mg/dL (ref 8–23)
CALCIUM: 9.8 mg/dL (ref 8.9–10.3)
CHLORIDE: 107 mmol/L (ref 98–111)
CO2: 26 mmol/L (ref 22–32)
CREATININE: 0.74 mg/dL (ref 0.44–1.00)
GFR calc Af Amer: >60 mL/min (ref >60)
Glucose, Bld: 97 mg/dL (ref 70–99)
Potassium: 3.7 mmol/L (ref 3.5–5.1)
Sodium: 142 mmol/L (ref 135–145)
Total Bilirubin: 0.6 mg/dL (ref 0.3–1.2)
Total Protein: 6.9 g/dL (ref 6.5–8.1)

## 2018-05-29 LAB — PROTIME-INR
INR: 1.14
PROTHROMBIN TIME: 14.5 s (ref 11.4–15.2)

## 2018-05-29 LAB — APTT: aPTT: 34 seconds (ref 24–36)

## 2018-05-29 MED ORDER — METHOCARBAMOL 750 MG PO TABS
750.0000 mg | ORAL_TABLET | Freq: Four times a day (QID) | ORAL | Status: DC
  Administered 2018-05-29 – 2018-06-06 (×26): 750 mg via ORAL
  Filled 2018-05-29 (×26): qty 1

## 2018-05-29 MED ORDER — LEVOTHYROXINE SODIUM 100 MCG PO TABS
100.0000 ug | ORAL_TABLET | Freq: Every day | ORAL | Status: DC
  Administered 2018-05-30 – 2018-06-06 (×8): 100 ug via ORAL
  Filled 2018-05-29 (×8): qty 1

## 2018-05-29 MED ORDER — MECLIZINE HCL 25 MG PO TABS
25.0000 mg | ORAL_TABLET | Freq: Three times a day (TID) | ORAL | Status: DC | PRN
  Filled 2018-05-29: qty 1

## 2018-05-29 MED ORDER — ACYCLOVIR 400 MG PO TABS
400.0000 mg | ORAL_TABLET | Freq: Two times a day (BID) | ORAL | Status: DC
  Administered 2018-05-30 – 2018-06-06 (×14): 400 mg via ORAL
  Filled 2018-05-29 (×16): qty 1

## 2018-05-29 MED ORDER — METHOCARBAMOL 500 MG PO TABS
500.0000 mg | ORAL_TABLET | Freq: Four times a day (QID) | ORAL | Status: DC | PRN
  Administered 2018-06-02 – 2018-06-04 (×3): 500 mg via ORAL
  Filled 2018-05-29 (×4): qty 1

## 2018-05-29 MED ORDER — DOCUSATE SODIUM 100 MG PO CAPS
100.0000 mg | ORAL_CAPSULE | Freq: Two times a day (BID) | ORAL | Status: DC
  Administered 2018-05-29 – 2018-06-04 (×13): 100 mg via ORAL
  Filled 2018-05-29 (×14): qty 1

## 2018-05-29 MED ORDER — MAGNESIUM CITRATE PO SOLN
1.0000 | Freq: Once | ORAL | Status: AC | PRN
  Administered 2018-06-04: 1 via ORAL
  Filled 2018-05-29 (×2): qty 296

## 2018-05-29 MED ORDER — ASPIRIN EC 325 MG PO TBEC: 325.0000 mg | DELAYED_RELEASE_TABLET | Freq: Every day | ORAL | Status: DC

## 2018-05-29 MED ORDER — MORPHINE SULFATE (PF) 2 MG/ML IV SOLN
0.5000 mg | INTRAVENOUS | Status: DC | PRN
  Administered 2018-06-01 – 2018-06-02 (×5): 0.5 mg via INTRAVENOUS
  Filled 2018-05-29 (×5): qty 1

## 2018-05-29 MED ORDER — OXYCODONE HCL 5 MG PO TABS
5.0000 mg | ORAL_TABLET | ORAL | Status: DC | PRN
  Administered 2018-05-31 – 2018-06-04 (×11): 10 mg via ORAL
  Administered 2018-06-05: 5 mg via ORAL
  Administered 2018-06-05 – 2018-06-06 (×2): 10 mg via ORAL
  Filled 2018-05-29: qty 1
  Filled 2018-05-29 (×14): qty 2

## 2018-05-29 MED ORDER — HYDROCODONE-ACETAMINOPHEN 5-325 MG PO TABS
1.0000 | ORAL_TABLET | Freq: Four times a day (QID) | ORAL | Status: DC | PRN
  Administered 2018-05-30 – 2018-05-31 (×5): 1 via ORAL
  Filled 2018-05-29 (×6): qty 1

## 2018-05-29 MED ORDER — OXYCODONE HCL 5 MG PO TABS
5.0000 mg | ORAL_TABLET | Freq: Four times a day (QID) | ORAL | Status: DC | PRN
  Administered 2018-05-29 – 2018-05-31 (×4): 5 mg via ORAL
  Filled 2018-05-29 (×4): qty 1

## 2018-05-29 MED ORDER — POLYETHYLENE GLYCOL 3350 17 G PO PACK
17.0000 g | PACK | Freq: Every day | ORAL | Status: DC | PRN
  Administered 2018-06-02: 17 g via ORAL
  Filled 2018-05-29: qty 1

## 2018-05-29 MED ORDER — DOCUSATE SODIUM 100 MG PO CAPS: 100.0000 mg | ORAL_CAPSULE | Freq: Two times a day (BID) | ORAL | Status: DC

## 2018-05-29 MED ORDER — SIMVASTATIN 40 MG PO TABS
40.0000 mg | ORAL_TABLET | Freq: Every day | ORAL | Status: DC
  Administered 2018-05-29 – 2018-06-05 (×8): 40 mg via ORAL
  Filled 2018-05-29 (×8): qty 1

## 2018-05-29 MED ORDER — METHOCARBAMOL 1000 MG/10ML IJ SOLN
500.0000 mg | Freq: Four times a day (QID) | INTRAVENOUS | Status: DC | PRN
  Filled 2018-05-29: qty 5

## 2018-05-29 MED ORDER — ASPIRIN EC 325 MG PO TBEC
325.0000 mg | DELAYED_RELEASE_TABLET | Freq: Every day | ORAL | Status: DC
  Administered 2018-05-30 – 2018-05-31 (×2): 325 mg via ORAL
  Filled 2018-05-29 (×2): qty 1

## 2018-05-29 MED ORDER — PANTOPRAZOLE SODIUM 40 MG PO TBEC
40.0000 mg | DELAYED_RELEASE_TABLET | Freq: Every day | ORAL | Status: DC
  Administered 2018-05-30 – 2018-06-06 (×7): 40 mg via ORAL
  Filled 2018-05-29 (×7): qty 1

## 2018-05-29 NOTE — H&P (Signed)
Signed         Show:Clear all [x] Manual[x] Template[] Copied  Added by: [x] Lanae Crumbly, PA-C  [] Hover for details   Office Visit Note              Patient: Emma Lewis                                    Date of Birth: 06-09-1951                                                    MRN: [id number] Visit Date: 05/29/2018                                                                     Requested by: Philmore Pali, NP [place], [place] [address] PCP: Philmore Pali, NP   Assessment & Plan: Visit Diagnoses:  1. History of left hip replacement   2. Dislocation of left hip, initial encounter ([place])   3. Periprosthetic fracture of femur at tip of prosthesis, initial encounter     Plan: I reviewed x-rays with patient and her husband who was present.  Discussed the findings of dislocation and periprosthetic femur fracture.  Advised that I reviewed x-rays with Dr. Marlou Sa, Dr. Ninfa Linden and Dr. Sharol Given and they all recommend a direct admit to the hospital.  We will apply 10 pounds Buck's traction per Dr. Sharol Given who is on-call for the weekend.  Dr. Lorin Mercy will return on Monday and will discuss timing of surgery.  Follow-Up Instructions: No follow-ups on file.   Orders:     Orders Placed This Encounter  Procedures  . XR HIP UNILAT W OR W/O PELVIS 1V LEFT  . XR Knee 1-2 Views Left   No orders of the defined types were placed in this encounter.     Procedures: No procedures performed   Clinical Data: No additional findings.   Subjective:    Chief Complaint  Patient presents with  . Left Hip - Follow-up    HPI 67 year old white female who is about 2 weeks status post left total hip arthroplasty revision from a posterior approach by Dr. Lorin Mercy returns recheck.  Patient has been nonweightbearing with ambulation.  Slight weightbearing with transfers.  No fall.  Complaining of pain in the left buttock groin and thigh.  Review of Systems No current cardiac  pulmonary GI GU issues  Objective: Vital Signs: There were no vitals taken for this visit.  Physical Exam  Ortho Exam  Specialty Comments:  No specialty comments available.  Imaging: Xr Hip Unilat W Or W/o Pelvis 1v Left  Result Date: 05/29/2018 Left hip x-rays today show hip dislocation.  Component has subsided.  Patient also does have a periprosthetic femur fracture distal tip of the prosthesis.  I did ask Dr. Alphonzo Severance, Jean Rosenthal, Meridee Score to review x-rays.    PMFS History:     Patient Active Problem List   Diagnosis Date Noted  . Prediabetes   . Popping sound of  knee joint   . Hypoalbuminemia due to protein-calorie malnutrition ([place])   . Transaminitis   . Leukocytosis   . Postoperative pain   . Failure of left total hip arthroplasty ([place]) 05/01/2018  . Acute blood loss anemia   . Leukemoid reaction   . 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 Joint effusion of pelvis or thigh, left 04/22/2018  . Trochanteric bursitis, left hip 04/22/2018  . History of left hip replacement 11/12/2017  . Bilateral primary osteoarthritis of knee 11/12/2017  . GERD (gastroesophageal reflux disease) 01/17/2017  . Hypothyroidism 01/17/2017  . Vertigo 01/16/2017  . Nausea and vomiting 01/16/2017  . Essential hypertension 01/16/2017  . Fatty liver 01/16/2017  . Failed total hip arthroplasty ([place]) 06/10/2016  . Pelvic mass 06/15/2015  . Closed wedge compression fracture of fifth lumbar vertebra ([place])   . Cervical spondylosis 02/09/2014  . DDD (degenerative disc disease), lumbar 05/13/2013  . OA (osteoarthritis) 05/13/2013       Past Medical History:  Diagnosis Date  . Arthritis    "all over my body" (04/29/2018)  . Chronic lower back pain   . Fatty liver   . Fever blister    Takes Acyclovir  . GERD (gastroesophageal reflux disease)   . Hyperlipemia   . Hypothyroidism   . Migraines   . Osteopenia   . Pneumonia    "once; years ago" (04/29/2018)    . PONV (postoperative nausea and vomiting)   . Pre-diabetes   . Seasonal allergies   . Vertigo          Family History  Problem Relation Age of Onset  . Lymphoma Mother          Past Surgical History:  Procedure Laterality Date  . ANTERIOR CERVICAL DECOMP/DISCECTOMY FUSION N/A 02/09/2014   Procedure: ANTERIOR CERVICAL DECOMPRESSION/DISCECTOMY FUSION 2 LEVELS;  Surgeon: [name], MD;  Location: [facility];  Service: Orthopedics;  Laterality: N/A;  C4-5, C5-6 Anterior Cervical Discectomy and Fusion, Allograft, Plate  . BACK SURGERY    . CARPAL TUNNEL RELEASE Right 2018  . CATARACT EXTRACTION W/ INTRAOCULAR LENS  IMPLANT, BILATERAL    . COLONOSCOPY    . FIXATION KYPHOPLASTY LUMBAR SPINE  X 2  . HIP ARTHROPLASTY Left 2011  . JOINT REPLACEMENT    . KNEE ARTHROSCOPY Right   . PATELLA FRACTURE SURGERY Right ~ 1990   "put metal in"  . PATELLA HARDWARE REMOVAL Right    "took the metal out"  . REVISION TOTAL HIP ARTHROPLASTY Left 04/29/2018  . ROBOTIC ASSISTED BILATERAL SALPINGO OOPHERECTOMY Bilateral 08/01/2015   Procedure: ROBOTIC ASSISTED BILATERAL SALPINGO OOPHORECTOMY;  Surgeon: Everitt Amber, MD;  Location: [facility];  Service: Gynecology;  Laterality: Bilateral;  . SHOULDER OPEN ROTATOR CUFF REPAIR Right    3 TOTAL  . SHOULDER SURGERY Right   . TOTAL HIP ARTHROPLASTY Right 01/21/2018   Procedure: RIGHT TOTAL HIP ARTHROPLASTY DIRECT ANTERIOR;  Surgeon: [name], MD;  Location: [facility];  Service: Orthopedics;  Laterality: Right;  . TOTAL HIP REVISION Left 06/10/2016   Procedure: TOTAL HIP REVISION ARTHROPLASTY;  Surgeon: Rod Can, MD;  Location: [facility];  Service: Orthopedics;  Laterality: Left;  . TOTAL HIP REVISION Left 04/29/2018   Procedure: left total hip arthroplasty revision posterior approach;  Surgeon: [name], MD;  Location: [facility];  Service: Orthopedics;  Laterality: Left;  . WRIST GANGLION EXCISION Left 1970s   Social History         Occupational History  . Not on file  Tobacco Use  . Smoking status: Never Smoker  . Smokeless tobacco: Never Used  Substance and Sexual Activity  . Alcohol use: Yes    Comment: 04/29/2018 "maybe 2 drinks/year"  . Drug use: Never  . Sexual activity: Not Currently

## 2018-05-29 NOTE — Progress Notes (Signed)
Office Visit Note   Patient: Emma Lewis           Date of Birth: 05-Mar-1951           MRN: [id number] Visit Date: 05/29/2018              Requested by: Philmore Pali, NP [address] [place], [place] [address] PCP: Philmore Pali, NP   Assessment & Plan: Visit Diagnoses:  1. History of left hip replacement   2. Dislocation of left hip, initial encounter ([place])   3. Periprosthetic fracture of femur at tip of prosthesis, initial encounter     Plan: I reviewed x-rays with patient and her husband who was present.  Discussed the findings of dislocation and periprosthetic femur fracture.  Advised that I reviewed x-rays with Dr. Marlou Sa, Dr. Ninfa Linden and Dr. Sharol Given and they all recommend a direct admit to the hospital.  We will apply 10 pounds Buck's traction per Dr. Sharol Given who is on-call for the weekend.  Dr. Lorin Mercy will return on Monday and will discuss timing of surgery.  Follow-Up Instructions: No follow-ups on file.   Orders:  Orders Placed This Encounter  Procedures  . XR HIP UNILAT W OR W/O PELVIS 1V LEFT  . XR Knee 1-2 Views Left   No orders of the defined types were placed in this encounter.     Procedures: No procedures performed   Clinical Data: No additional findings.   Subjective: Chief Complaint  Patient presents with  . Left Hip - Follow-up    HPI 67 year old white female who is about 2 weeks status post left total hip arthroplasty revision from a posterior approach by Dr. Lorin Mercy returns recheck.  Patient has been nonweightbearing with ambulation.  Slight weightbearing with transfers.  No fall.  Complaining of pain in the left buttock groin and thigh.  Review of Systems No current cardiac pulmonary GI GU issues  Objective: Vital Signs: There were no vitals taken for this visit.  Physical Exam  Ortho Exam  Specialty Comments:  No specialty comments available.  Imaging: Xr Hip Unilat W Or W/o Pelvis 1v Left  Result Date: 05/29/2018 Left hip x-rays today show  hip dislocation.  Component has subsided.  Patient also does have a periprosthetic femur fracture distal tip of the prosthesis.  I did ask Dr. Alphonzo Severance, Jean Rosenthal, Meridee Score to review x-rays.    PMFS History: Patient Active Problem List   Diagnosis Date Noted  . Prediabetes   . Popping sound of knee joint   . Hypoalbuminemia due to protein-calorie malnutrition ([place])   . Transaminitis   . Leukocytosis   . Postoperative pain   . Failure of left total hip arthroplasty ([place]) 05/01/2018  . Acute blood loss anemia   . Leukemoid reaction   . 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 Joint effusion of pelvis or thigh, left 04/22/2018  . Trochanteric bursitis, left hip 04/22/2018  . History of left hip replacement 11/12/2017  . Bilateral primary osteoarthritis of knee 11/12/2017  . GERD (gastroesophageal reflux disease) 01/17/2017  . Hypothyroidism 01/17/2017  . Vertigo 01/16/2017  . Nausea and vomiting 01/16/2017  . Essential hypertension 01/16/2017  . Fatty liver 01/16/2017  . Failed total hip arthroplasty ([place]) 06/10/2016  . Pelvic mass 06/15/2015  . Closed wedge compression fracture of fifth lumbar vertebra ([place])   . Cervical spondylosis 02/09/2014  . DDD (degenerative disc disease), lumbar 05/13/2013  . OA (osteoarthritis) 05/13/2013   Past Medical History:  Diagnosis Date  .  Arthritis    "all over my body" (04/29/2018)  . Chronic lower back pain   . Fatty liver   . Fever blister    Takes Acyclovir  . GERD (gastroesophageal reflux disease)   . Hyperlipemia   . Hypothyroidism   . Migraines   . Osteopenia   . Pneumonia    "once; years ago" (04/29/2018)  . PONV (postoperative nausea and vomiting)   . Pre-diabetes   . Seasonal allergies   . Vertigo     Family History  Problem Relation Age of Onset  . Lymphoma Mother     Past Surgical History:  Procedure Laterality Date  . ANTERIOR CERVICAL DECOMP/DISCECTOMY FUSION N/A 02/09/2014   Procedure: ANTERIOR CERVICAL  DECOMPRESSION/DISCECTOMY FUSION 2 LEVELS;  Surgeon: [name], MD;  Location: [facility];  Service: Orthopedics;  Laterality: N/A;  C4-5, C5-6 Anterior Cervical Discectomy and Fusion, Allograft, Plate  . BACK SURGERY    . CARPAL TUNNEL RELEASE Right 2018  . CATARACT EXTRACTION W/ INTRAOCULAR LENS  IMPLANT, BILATERAL    . COLONOSCOPY    . FIXATION KYPHOPLASTY LUMBAR SPINE  X 2  . HIP ARTHROPLASTY Left 2011  . JOINT REPLACEMENT    . KNEE ARTHROSCOPY Right   . PATELLA FRACTURE SURGERY Right ~ 1990   "put metal in"  . PATELLA HARDWARE REMOVAL Right    "took the metal out"  . REVISION TOTAL HIP ARTHROPLASTY Left 04/29/2018  . ROBOTIC ASSISTED BILATERAL SALPINGO OOPHERECTOMY Bilateral 08/01/2015   Procedure: ROBOTIC ASSISTED BILATERAL SALPINGO OOPHORECTOMY;  Surgeon: Everitt Amber, MD;  Location: [facility];  Service: Gynecology;  Laterality: Bilateral;  . SHOULDER OPEN ROTATOR CUFF REPAIR Right    3 TOTAL  . SHOULDER SURGERY Right   . TOTAL HIP ARTHROPLASTY Right 01/21/2018   Procedure: RIGHT TOTAL HIP ARTHROPLASTY DIRECT ANTERIOR;  Surgeon: [name], MD;  Location: [facility];  Service: Orthopedics;  Laterality: Right;  . TOTAL HIP REVISION Left 06/10/2016   Procedure: TOTAL HIP REVISION ARTHROPLASTY;  Surgeon: Rod Can, MD;  Location: [facility];  Service: Orthopedics;  Laterality: Left;  . TOTAL HIP REVISION Left 04/29/2018   Procedure: left total hip arthroplasty revision posterior approach;  Surgeon: [name], MD;  Location: [facility];  Service: Orthopedics;  Laterality: Left;  . WRIST GANGLION EXCISION Left 1970s   Social History   Occupational History  . Not on file  Tobacco Use  . Smoking status: Never Smoker  . Smokeless tobacco: Never Used  Substance and Sexual Activity  . Alcohol use: Yes    Comment: 04/29/2018 "maybe 2 drinks/year"  . Drug use: Never  . Sexual activity: Not Currently

## 2018-05-29 NOTE — Progress Notes (Signed)
RN paged ortho tech for bucks traction to be applied awaiting call back

## 2018-05-29 NOTE — Progress Notes (Signed)
Orthopedic Tech Progress Note Patient Details:  Emma Lewis 1951/03/04 [id number]  Musculoskeletal Traction Type of Traction: Bucks Skin Traction Traction Location: lle Traction Weight: 10 lbs   Post Interventions Patient Tolerated: Well Instructions Provided: Care of device   Biviana Saddler 05/29/2018, 3:00 PM

## 2018-05-30 ENCOUNTER — Encounter (HOSPITAL_COMMUNITY): Payer: Self-pay | Admitting: *Deleted

## 2018-05-30 ENCOUNTER — Other Ambulatory Visit: Payer: Self-pay

## 2018-05-30 LAB — CBC
HCT: 31.3 % — ABNORMAL LOW (ref 36.0–46.0)
Hemoglobin: 9.8 g/dL — ABNORMAL LOW (ref 12.0–15.0)
MCH: 28.6 pg (ref 26.0–34.0)
MCHC: 31.3 g/dL (ref 30.0–36.0)
MCV: 91.3 fL (ref 80.0–100.0)
NRBC: 0 % (ref 0.0–0.2)
PLATELETS: 245 10*3/uL (ref 150–400)
RBC: 3.43 MIL/uL — ABNORMAL LOW (ref 3.87–5.11)
RDW: 15.1 % (ref 11.5–15.5)
WBC: 6.1 10*3/uL (ref 4.0–10.5)

## 2018-05-30 LAB — BASIC METABOLIC PANEL
ANION GAP: 6 (ref 5–15)
BUN: 11 mg/dL (ref 8–23)
CALCIUM: 9.2 mg/dL (ref 8.9–10.3)
CO2: 23 mmol/L (ref 22–32)
CREATININE: 0.65 mg/dL (ref 0.44–1.00)
Chloride: 111 mmol/L (ref 98–111)
GFR calc Af Amer: >60 mL/min (ref >60)
Glucose, Bld: 81 mg/dL (ref 70–99)
Potassium: 3.9 mmol/L (ref 3.5–5.1)
Sodium: 140 mmol/L (ref 135–145)

## 2018-05-30 LAB — COMPREHENSIVE METABOLIC PANEL
ALK PHOS: 254 U/L — AB (ref 38–126)
ALT: 43 U/L (ref 0–44)
AST: 57 U/L — AB (ref 15–41)
Albumin: 3.2 g/dL — ABNORMAL LOW (ref 3.5–5.0)
Anion gap: 4 — ABNORMAL LOW (ref 5–15)
BILIRUBIN TOTAL: 0.5 mg/dL (ref 0.3–1.2)
BUN: 12 mg/dL (ref 8–23)
CALCIUM: 9.3 mg/dL (ref 8.9–10.3)
CHLORIDE: 110 mmol/L (ref 98–111)
CO2: 27 mmol/L (ref 22–32)
CREATININE: 0.72 mg/dL (ref 0.44–1.00)
Glucose, Bld: 89 mg/dL (ref 70–99)
Potassium: 4 mmol/L (ref 3.5–5.1)
Sodium: 141 mmol/L (ref 135–145)
TOTAL PROTEIN: 6.6 g/dL (ref 6.5–8.1)

## 2018-05-30 NOTE — Plan of Care (Signed)

## 2018-05-30 NOTE — Progress Notes (Signed)
Patient ID: Emma Lewis, female   DOB: Oct 30, 1950, 67 y.o.   MRN: [id number] Patient seen in follow-up for subluxation left total hip arthroplasty with subsidence of the stem and periprosthetic femur fracture.  Patient is comfortable in bed she is in Buck's traction.  Plan for surgery Monday with Dr. Lorin Mercy.

## 2018-05-31 ENCOUNTER — Encounter (HOSPITAL_COMMUNITY): Payer: Self-pay | Admitting: *Deleted

## 2018-05-31 NOTE — Progress Notes (Signed)
Patient ID: Emma Lewis, female   DOB: 09-02-1950, 67 y.o.   MRN: [id number] Patient without complaints this morning.  The Buck's traction is removed patient is comfortable.  Plan for revision surgery with Dr. Lorin Mercy tomorrow.  N.p.o. after midnight.

## 2018-05-31 NOTE — Progress Notes (Signed)
Pt educated on raising the incline of lower end of bed to make it horizontal. Pt medicated at this time for pain.

## 2018-06-01 ENCOUNTER — Inpatient Hospital Stay (HOSPITAL_COMMUNITY): Payer: Medicare Other | Admitting: Anesthesiology

## 2018-06-01 ENCOUNTER — Inpatient Hospital Stay (HOSPITAL_COMMUNITY): Payer: Medicare Other

## 2018-06-01 ENCOUNTER — Encounter (HOSPITAL_COMMUNITY): Payer: Self-pay | Admitting: Anesthesiology

## 2018-06-01 ENCOUNTER — Encounter (HOSPITAL_COMMUNITY)
Admission: AD | Disposition: A | Discharge status: Home Health | Payer: Self-pay | Source: Home / Self Care | Attending: Orthopedic Surgery

## 2018-06-01 DIAGNOSIS — Z96649 Presence of unspecified artificial hip joint: Secondary | ICD-10-CM

## 2018-06-01 DIAGNOSIS — M978XXD Periprosthetic fracture around other internal prosthetic joint, subsequent encounter: Secondary | ICD-10-CM

## 2018-06-01 HISTORY — PX: ORIF PERIPROSTHETIC FRACTURE: SHX5034

## 2018-06-01 LAB — SURGICAL PCR SCREEN
MRSA, PCR: NEGATIVE
STAPHYLOCOCCUS AUREUS: NEGATIVE

## 2018-06-01 LAB — POCT I-STAT 4, (NA,K, GLUC, HGB,HCT)
GLUCOSE: 118 mg/dL — AB (ref 70–99)
Glucose, Bld: 129 mg/dL — ABNORMAL HIGH (ref 70–99)
HCT: 32 % — ABNORMAL LOW (ref 36.0–46.0)
HCT: 31 % — ABNORMAL LOW (ref 36.0–46.0)
HEMOGLOBIN: 10.5 g/dL — AB (ref 12.0–15.0)
Hemoglobin: 10.9 g/dL — ABNORMAL LOW (ref 12.0–15.0)
POTASSIUM: 4.5 mmol/L (ref 3.5–5.1)
Potassium: 4.3 mmol/L (ref 3.5–5.1)
Sodium: 138 mmol/L (ref 135–145)
Sodium: 138 mmol/L (ref 135–145)

## 2018-06-01 LAB — PREPARE RBC (CROSSMATCH)

## 2018-06-01 SURGERY — OPEN REDUCTION INTERNAL FIXATION (ORIF) PERIPROSTHETIC FRACTURE
Anesthesia: General | Site: Hip | Laterality: Left

## 2018-06-01 MED ORDER — FENTANYL CITRATE (PF) 250 MCG/5ML IJ SOLN
INTRAMUSCULAR | Status: AC
  Filled 2018-06-01: qty 5

## 2018-06-01 MED ORDER — PROMETHAZINE HCL 25 MG/ML IJ SOLN: 6.2500 mg | INTRAMUSCULAR | Status: DC | PRN

## 2018-06-01 MED ORDER — ONDANSETRON HCL 4 MG/2ML IJ SOLN: 4.0000 mg | Freq: Four times a day (QID) | INTRAMUSCULAR | Status: DC | PRN

## 2018-06-01 MED ORDER — MENTHOL 3 MG MT LOZG: 1.0000 | LOZENGE | OROMUCOSAL | Status: DC | PRN

## 2018-06-01 MED ORDER — PHENYLEPHRINE HCL 10 MG/ML IJ SOLN
INTRAMUSCULAR | Status: DC | PRN
  Administered 2018-06-01 (×3): 80 ug via INTRAVENOUS

## 2018-06-01 MED ORDER — ROCURONIUM BROMIDE 50 MG/5ML IV SOSY
PREFILLED_SYRINGE | INTRAVENOUS | Status: AC
  Filled 2018-06-01: qty 5

## 2018-06-01 MED ORDER — ONDANSETRON HCL 4 MG PO TABS: 4.0000 mg | ORAL_TABLET | Freq: Four times a day (QID) | ORAL | Status: DC | PRN

## 2018-06-01 MED ORDER — DOCUSATE SODIUM 100 MG PO CAPS: 100.0000 mg | ORAL_CAPSULE | Freq: Two times a day (BID) | ORAL | Status: DC

## 2018-06-01 MED ORDER — DEXAMETHASONE SODIUM PHOSPHATE 10 MG/ML IJ SOLN
INTRAMUSCULAR | Status: AC
  Filled 2018-06-01: qty 3

## 2018-06-01 MED ORDER — LACTATED RINGERS IV SOLN: INTRAVENOUS | Status: DC

## 2018-06-01 MED ORDER — CEFAZOLIN SODIUM-DEXTROSE 2-4 GM/100ML-% IV SOLN
INTRAVENOUS | Status: AC
  Filled 2018-06-01: qty 100

## 2018-06-01 MED ORDER — FENTANYL CITRATE (PF) 100 MCG/2ML IJ SOLN
INTRAMUSCULAR | Status: AC
  Administered 2018-06-01: 50 ug via INTRAVENOUS
  Filled 2018-06-01: qty 2

## 2018-06-01 MED ORDER — CEFAZOLIN SODIUM 1 G IJ SOLR
INTRAMUSCULAR | Status: AC
  Filled 2018-06-01: qty 20

## 2018-06-01 MED ORDER — BUPIVACAINE HCL (PF) 0.25 % IJ SOLN
INTRAMUSCULAR | Status: DC | PRN
  Administered 2018-06-01: 20 mL

## 2018-06-01 MED ORDER — MIDAZOLAM HCL 5 MG/5ML IJ SOLN
INTRAMUSCULAR | Status: DC | PRN
  Administered 2018-06-01: 2 mg via INTRAVENOUS

## 2018-06-01 MED ORDER — PHENOL 1.4 % MT LIQD: 1.0000 | OROMUCOSAL | Status: DC | PRN

## 2018-06-01 MED ORDER — HYDROMORPHONE HCL 1 MG/ML IJ SOLN
INTRAMUSCULAR | Status: DC | PRN
  Administered 2018-06-01: .5 mg via INTRAVENOUS

## 2018-06-01 MED ORDER — 0.9 % SODIUM CHLORIDE (POUR BTL) OPTIME
TOPICAL | Status: DC | PRN
  Administered 2018-06-01: 1000 mL

## 2018-06-01 MED ORDER — HYDROMORPHONE HCL 1 MG/ML IJ SOLN
INTRAMUSCULAR | Status: AC
Start: 2018-06-01 — End: 2018-06-02
  Filled 2018-06-01: qty 1

## 2018-06-01 MED ORDER — CEFAZOLIN SODIUM-DEXTROSE 1-4 GM/50ML-% IV SOLN
1.0000 g | Freq: Three times a day (TID) | INTRAVENOUS | Status: AC
  Administered 2018-06-01 – 2018-06-02 (×2): 1 g via INTRAVENOUS
  Filled 2018-06-01 (×2): qty 50

## 2018-06-01 MED ORDER — ARTIFICIAL TEARS OPHTHALMIC OINT
TOPICAL_OINTMENT | OPHTHALMIC | Status: AC
  Filled 2018-06-01: qty 3.5

## 2018-06-01 MED ORDER — LACTATED RINGERS IV SOLN
INTRAVENOUS | Status: DC | PRN
  Administered 2018-06-01 (×3): via INTRAVENOUS

## 2018-06-01 MED ORDER — MIDAZOLAM HCL 2 MG/2ML IJ SOLN
INTRAMUSCULAR | Status: AC
  Filled 2018-06-01: qty 2

## 2018-06-01 MED ORDER — FENTANYL CITRATE (PF) 100 MCG/2ML IJ SOLN
INTRAMUSCULAR | Status: DC | PRN
  Administered 2018-06-01 (×2): 50 ug via INTRAVENOUS
  Administered 2018-06-01: 25 ug via INTRAVENOUS
  Administered 2018-06-01: 100 ug via INTRAVENOUS
  Administered 2018-06-01: 50 ug via INTRAVENOUS
  Administered 2018-06-01: 25 ug via INTRAVENOUS
  Administered 2018-06-01: 50 ug via INTRAVENOUS

## 2018-06-01 MED ORDER — EPHEDRINE SULFATE 50 MG/ML IJ SOLN
INTRAMUSCULAR | Status: DC | PRN
  Administered 2018-06-01 (×2): 5 mg via INTRAVENOUS

## 2018-06-01 MED ORDER — LIDOCAINE 2% (20 MG/ML) 5 ML SYRINGE
INTRAMUSCULAR | Status: DC | PRN
  Administered 2018-06-01: 80 mg via INTRAVENOUS

## 2018-06-01 MED ORDER — ROCURONIUM BROMIDE 50 MG/5ML IV SOSY
PREFILLED_SYRINGE | INTRAVENOUS | Status: DC | PRN
  Administered 2018-06-01 (×2): 10 mg via INTRAVENOUS
  Administered 2018-06-01: 50 mg via INTRAVENOUS
  Administered 2018-06-01: 10 mg via INTRAVENOUS

## 2018-06-01 MED ORDER — BUPIVACAINE HCL (PF) 0.25 % IJ SOLN
INTRAMUSCULAR | Status: AC
  Filled 2018-06-01: qty 30

## 2018-06-01 MED ORDER — ONDANSETRON HCL 4 MG/2ML IJ SOLN
INTRAMUSCULAR | Status: DC | PRN
  Administered 2018-06-01: 4 mg via INTRAVENOUS

## 2018-06-01 MED ORDER — METOCLOPRAMIDE HCL 5 MG PO TABS: 5.0000 mg | ORAL_TABLET | Freq: Three times a day (TID) | ORAL | Status: DC | PRN

## 2018-06-01 MED ORDER — SCOPOLAMINE 1 MG/3DAYS TD PT72
1.0000 | MEDICATED_PATCH | TRANSDERMAL | Status: DC
  Administered 2018-06-01: 1.5 mg via TRANSDERMAL

## 2018-06-01 MED ORDER — METOCLOPRAMIDE HCL 5 MG/ML IJ SOLN: 5.0000 mg | Freq: Three times a day (TID) | INTRAMUSCULAR | Status: DC | PRN

## 2018-06-01 MED ORDER — HYDROMORPHONE HCL 1 MG/ML IJ SOLN
0.2500 mg | INTRAMUSCULAR | Status: DC | PRN
  Administered 2018-06-01 (×2): 0.5 mg via INTRAVENOUS

## 2018-06-01 MED ORDER — LIDOCAINE 2% (20 MG/ML) 5 ML SYRINGE
INTRAMUSCULAR | Status: AC
  Filled 2018-06-01: qty 5

## 2018-06-01 MED ORDER — PHENYLEPHRINE 40 MCG/ML (10ML) SYRINGE FOR IV PUSH (FOR BLOOD PRESSURE SUPPORT)
PREFILLED_SYRINGE | INTRAVENOUS | Status: AC
  Filled 2018-06-01: qty 10

## 2018-06-01 MED ORDER — SODIUM CHLORIDE 0.9 % IV SOLN
INTRAVENOUS | Status: DC | PRN
  Administered 2018-06-01: 35 ug/min via INTRAVENOUS

## 2018-06-01 MED ORDER — ACETAMINOPHEN 10 MG/ML IV SOLN
INTRAVENOUS | Status: DC | PRN
  Administered 2018-06-01: 1000 mg via INTRAVENOUS

## 2018-06-01 MED ORDER — PROPOFOL 10 MG/ML IV BOLUS
INTRAVENOUS | Status: AC
  Filled 2018-06-01: qty 20

## 2018-06-01 MED ORDER — ONDANSETRON HCL 4 MG/2ML IJ SOLN
INTRAMUSCULAR | Status: AC
  Filled 2018-06-01: qty 6

## 2018-06-01 MED ORDER — SCOPOLAMINE 1 MG/3DAYS TD PT72
MEDICATED_PATCH | TRANSDERMAL | Status: AC
  Administered 2018-06-01: 1.5 mg via TRANSDERMAL
  Filled 2018-06-01: qty 1

## 2018-06-01 MED ORDER — FENTANYL CITRATE (PF) 100 MCG/2ML IJ SOLN: 100.0000 ug | Freq: Once | INTRAMUSCULAR | Status: DC

## 2018-06-01 MED ORDER — ASPIRIN EC 325 MG PO TBEC
325.0000 mg | DELAYED_RELEASE_TABLET | Freq: Every day | ORAL | Status: DC
  Administered 2018-06-02 – 2018-06-06 (×5): 325 mg via ORAL
  Filled 2018-06-01 (×5): qty 1

## 2018-06-01 MED ORDER — CEFAZOLIN SODIUM-DEXTROSE 2-4 GM/100ML-% IV SOLN
2.0000 g | Freq: Once | INTRAVENOUS | Status: AC
  Administered 2018-06-01 (×2): 2 g via INTRAVENOUS

## 2018-06-01 MED ORDER — DEXAMETHASONE SODIUM PHOSPHATE 10 MG/ML IJ SOLN
INTRAMUSCULAR | Status: DC | PRN
  Administered 2018-06-01: 10 mg via INTRAVENOUS

## 2018-06-01 MED ORDER — SUGAMMADEX SODIUM 200 MG/2ML IV SOLN
INTRAVENOUS | Status: DC | PRN
  Administered 2018-06-01: 200 mg via INTRAVENOUS

## 2018-06-01 MED ORDER — ACETAMINOPHEN 10 MG/ML IV SOLN
INTRAVENOUS | Status: AC
  Filled 2018-06-01: qty 100

## 2018-06-01 MED ORDER — PROPOFOL 10 MG/ML IV BOLUS
INTRAVENOUS | Status: DC | PRN
  Administered 2018-06-01: 150 mg via INTRAVENOUS

## 2018-06-01 MED ORDER — EPHEDRINE 5 MG/ML INJ
INTRAVENOUS | Status: AC
  Filled 2018-06-01: qty 10

## 2018-06-01 MED ORDER — HYDROMORPHONE HCL 1 MG/ML IJ SOLN
INTRAMUSCULAR | Status: AC
  Filled 2018-06-01: qty 0.5

## 2018-06-01 MED ORDER — FENTANYL CITRATE (PF) 100 MCG/2ML IJ SOLN
50.0000 ug | Freq: Once | INTRAMUSCULAR | Status: AC
  Administered 2018-06-01: 50 ug via INTRAVENOUS

## 2018-06-01 SURGICAL SUPPLY — 100 items
BIT DRILL 2.7 (BIT) ×1
BIT DRILL 2.7MM (BIT) IMPLANT
BIT DRILL 4.3 (BIT) IMPLANT
BLADE CLIPPER SURG (BLADE) IMPLANT
BLADE SAW SAG 73X25 THK (BLADE) ×2
BLADE SAW SGTL 73X25 THK (BLADE) ×1 IMPLANT
BONE CANC CHIPS 40CC CAN1/2 (Bone Implant) ×3 IMPLANT
BRUSH FEMORAL CANAL (MISCELLANEOUS) IMPLANT
BUTTON HEX 3.5 (Orthopedic Implant) ×2 IMPLANT
BUTTON HEX 3.5 HIP (Orthopedic Implant) IMPLANT
BUTTON HEX 3.5MM (Orthopedic Implant) ×2 IMPLANT
CABLE CERLAGE W/CRIMP 1.8 (Cable) ×10 IMPLANT
CABLE CERLAGE W/CRIMP 1.8MM (Cable) ×10 IMPLANT
CHIPS CANC BONE 40CC CAN1/2 (Bone Implant) ×1 IMPLANT
CLOSURE WOUND 1/2 X4 (GAUZE/BANDAGES/DRESSINGS) ×1
COVER BACK TABLE 24X17X13 BIG (DRAPES) ×3 IMPLANT
COVER WAND RF STERILE (DRAPES) ×3 IMPLANT
DRAPE IMP U-DRAPE 54X76 (DRAPES) ×3 IMPLANT
DRAPE INCISE IOBAN 66X45 STRL (DRAPES) IMPLANT
DRAPE ORTHO SPLIT 77X108 STRL (DRAPES) ×6
DRAPE SURG ORHT 6 SPLT 77X108 (DRAPES) ×2 IMPLANT
DRAPE U-SHAPE 47X51 STRL (DRAPES) ×3 IMPLANT
DRILL BIT 2.7MM (BIT) ×3
DRILL BIT 4.3 (BIT) ×3
DRILL BIT 7/64X5 (BIT) ×3 IMPLANT
DRSG AQUACEL AG ADV 3.5X10 (GAUZE/BANDAGES/DRESSINGS) ×2 IMPLANT
DRSG AQUACEL AG ADV 3.5X14 (GAUZE/BANDAGES/DRESSINGS) ×2 IMPLANT
DRSG PAD ABDOMINAL 8X10 ST (GAUZE/BANDAGES/DRESSINGS) ×6 IMPLANT
DURAPREP 26ML APPLICATOR (WOUND CARE) ×3 IMPLANT
ELECT CAUTERY BLADE 6.4 (BLADE) ×3 IMPLANT
ELECT REM PT RETURN 9FT ADLT (ELECTROSURGICAL) ×3
ELECTRODE REM PT RTRN 9FT ADLT (ELECTROSURGICAL) ×1 IMPLANT
EVACUATOR 1/8 PVC DRAIN (DRAIN) IMPLANT
FACESHIELD WRAPAROUND (MASK) ×6 IMPLANT
FACESHIELD WRAPAROUND OR TEAM (MASK) ×2 IMPLANT
GAUZE SPONGE 4X4 12PLY STRL (GAUZE/BANDAGES/DRESSINGS) ×3 IMPLANT
GAUZE XEROFORM 5X9 LF (GAUZE/BANDAGES/DRESSINGS) ×3 IMPLANT
GLOVE BIOGEL PI IND STRL 8 (GLOVE) ×2 IMPLANT
GLOVE BIOGEL PI INDICATOR 8 (GLOVE) ×4
GLOVE ORTHO TXT STRL SZ7.5 (GLOVE) ×6 IMPLANT
GOWN STRL REUS W/ TWL LRG LVL3 (GOWN DISPOSABLE) ×1 IMPLANT
GOWN STRL REUS W/ TWL XL LVL3 (GOWN DISPOSABLE) ×1 IMPLANT
GOWN STRL REUS W/TWL 2XL LVL3 (GOWN DISPOSABLE) ×3 IMPLANT
GOWN STRL REUS W/TWL LRG LVL3 (GOWN DISPOSABLE) ×3
GOWN STRL REUS W/TWL XL LVL3 (GOWN DISPOSABLE) ×3
GRAFT BNE CHIP CANC 1-8 40 (Bone Implant) IMPLANT
HANDPIECE INTERPULSE COAX TIP (DISPOSABLE)
HEX BUTTON 3.5 HIP (Orthopedic Implant) ×3 IMPLANT
IMMOBILIZER KNEE 20 (SOFTGOODS) IMPLANT
IMMOBILIZER KNEE 22 UNIV (SOFTGOODS) IMPLANT
IMMOBILIZER KNEE 24 THIGH 36 (MISCELLANEOUS) IMPLANT
IMMOBILIZER KNEE 24 UNIV (MISCELLANEOUS)
KIT BASIN OR (CUSTOM PROCEDURE TRAY) ×3 IMPLANT
KIT TURNOVER KIT B (KITS) ×3 IMPLANT
LOCKPLATE CABLE BUTTON NCP HIP (Orthopedic Implant) ×14 IMPLANT
MANIFOLD NEPTUNE II (INSTRUMENTS) ×3 IMPLANT
NDL 1/2 CIR MAYO (NEEDLE) ×1 IMPLANT
NDL HYPO 25GX1X1/2 BEV (NEEDLE) ×1 IMPLANT
NEEDLE 1/2 CIR MAYO (NEEDLE) ×3 IMPLANT
NEEDLE HYPO 25GX1X1/2 BEV (NEEDLE) ×3 IMPLANT
NEEDLE MAYO .5 CIRCLE (NEEDLE) ×3 IMPLANT
NS IRRIG 1000ML POUR BTL (IV SOLUTION) ×3 IMPLANT
PACK TOTAL JOINT (CUSTOM PROCEDURE TRAY) ×3 IMPLANT
PACK UNIVERSAL I (CUSTOM PROCEDURE TRAY) ×3 IMPLANT
PAD ARMBOARD 7.5X6 YLW CONV (MISCELLANEOUS) ×6 IMPLANT
PIN STEINMAN 3/16 (PIN) ×3 IMPLANT
PLATE LEFT PROX FEMUR 15H (Plate) ×2 IMPLANT
PLATE TROCHANTER 64 LEFT NARRW (Plate) ×2 IMPLANT
PRESSURIZER FEMORAL UNIV (MISCELLANEOUS) IMPLANT
SCREW CORT NCB SELFTAP 5.0X42 (Screw) ×2 IMPLANT
SCREW CORTICAL NCB 5.0X40 (Screw) ×2 IMPLANT
SCREW LOCK TI 3.5X20 (Screw) ×4 IMPLANT
SCREW LOCK TI 3.5X22 (Screw) ×2 IMPLANT
SCREW NCB 5.0X34MM (Screw) ×2 IMPLANT
SET HNDPC FAN SPRY TIP SCT (DISPOSABLE) IMPLANT
SPONGE LAP 4X18 RFD (DISPOSABLE) ×6 IMPLANT
STAPLER VISISTAT 35W (STAPLE) ×3 IMPLANT
STRIP CLOSURE SKIN 1/2X4 (GAUZE/BANDAGES/DRESSINGS) ×2 IMPLANT
SUCTION FRAZIER HANDLE 10FR (MISCELLANEOUS) ×2
SUCTION TUBE FRAZIER 10FR DISP (MISCELLANEOUS) ×1 IMPLANT
SUT ETHIBOND NAB CT1 #1 30IN (SUTURE) ×9 IMPLANT
SUT TICRON (SUTURE) ×6 IMPLANT
SUT VIC AB 0 CT1 27 (SUTURE) ×6
SUT VIC AB 0 CT1 27XBRD ANBCTR (SUTURE) IMPLANT
SUT VIC AB 0 CTX 36 (SUTURE) ×15
SUT VIC AB 0 CTX36XBRD ANTBCTR (SUTURE) IMPLANT
SUT VIC AB 1 CT1 27 (SUTURE) ×3
SUT VIC AB 1 CT1 27XBRD ANBCTR (SUTURE) IMPLANT
SUT VIC AB 1 CTX 27 (SUTURE) ×4 IMPLANT
SUT VIC AB 1 CTX 36 (SUTURE) ×6
SUT VIC AB 1 CTX36XBRD ANBCTR (SUTURE) IMPLANT
SUT VIC AB 2-0 CT1 27 (SUTURE) ×6
SUT VIC AB 2-0 CT1 TAPERPNT 27 (SUTURE) ×2 IMPLANT
SUT VICRYL 0 TIES 12 18 (SUTURE) IMPLANT
SUT VICRYL 4-0 PS2 18IN ABS (SUTURE) ×3 IMPLANT
SYR CONTROL 10ML LL (SYRINGE) ×3 IMPLANT
TOWEL OR 17X26 10 PK STRL BLUE (TOWEL DISPOSABLE) ×3 IMPLANT
TRAY CATH 16FR W/PLASTIC CATH (SET/KITS/TRAYS/PACK) IMPLANT
TRAY FOLEY MTR SLVR 16FR STAT (SET/KITS/TRAYS/PACK) IMPLANT
TRAY FOLEY W/BAG SLVR 14FR (SET/KITS/TRAYS/PACK) ×2 IMPLANT

## 2018-06-01 NOTE — Anesthesia Procedure Notes (Signed)
Procedure Name: Intubation Date/Time: 06/01/2018 9:54 AM Performed by: Neldon Newport, CRNA Pre-anesthesia Checklist: Timeout performed, Patient being monitored, Suction available, Emergency Drugs available and Patient identified Patient Re-evaluated:Patient Re-evaluated prior to induction Oxygen Delivery Method: Circle system utilized Preoxygenation: Pre-oxygenation with 100% oxygen Induction Type: IV induction Ventilation: Mask ventilation without difficulty Laryngoscope Size: Mac and 3 Grade View: Grade I Tube type: Oral Tube size: 7.0 mm Number of attempts: 1 Placement Confirmation: breath sounds checked- equal and bilateral,  positive ETCO2 and ETT inserted through vocal cords under direct vision Secured at: 21 cm Tube secured with: Tape Dental Injury: Teeth and Oropharynx as per pre-operative assessment

## 2018-06-01 NOTE — Progress Notes (Signed)
Pt returned from OR. Drowsy, easily aroused. No acute distress noted. C/o pain 8/10 in left hip, medicated as needed for pain.

## 2018-06-01 NOTE — Anesthesia Postprocedure Evaluation (Signed)
Anesthesia Post Note  Patient: Emma Lewis  Procedure(s) Performed: OPEN REDUCTION INTERNAL FIXATION (ORIF) PERIPROSTHETIC FEMUR FRACTURE (Left Hip)     Patient location during evaluation: PACU Anesthesia Type: General Level of consciousness: awake and alert Pain management: pain level controlled Vital Signs Assessment: post-procedure vital signs reviewed and stable Respiratory status: spontaneous breathing, nonlabored ventilation, respiratory function stable and patient connected to nasal cannula oxygen Cardiovascular status: blood pressure returned to baseline and stable Postop Assessment: no apparent nausea or vomiting Anesthetic complications: no    Last Vitals:  Vitals:   06/01/18 1455 06/01/18 1510  BP: 102/82 104/61  Pulse: 86 89  Resp: 12 11  Temp:    SpO2: 94% 94%    Last Pain:  Vitals:   06/01/18 1500  TempSrc:   PainSc: Asleep                 Rand Boller S

## 2018-06-01 NOTE — Progress Notes (Addendum)
Pt transferred to surgery. No acute distress noted. Vital signs stable. Report called to [facility] in short stay surgery.

## 2018-06-01 NOTE — Transfer of Care (Signed)
Immediate Anesthesia Transfer of Care Note  Patient: Emma Lewis  Procedure(s) Performed: OPEN REDUCTION INTERNAL FIXATION (ORIF) PERIPROSTHETIC FEMUR FRACTURE (Left Hip)  Patient Location: PACU  Anesthesia Type:General  Level of Consciousness: sedated  Airway & Oxygen Therapy: Patient Spontanous Breathing and Patient connected to nasal cannula oxygen  Post-op Assessment: Report given to RN, Post -op Vital signs reviewed and stable and Patient moving all extremities X 4  Post vital signs: Reviewed and stable  Last Vitals:  Vitals Value Taken Time  BP 103/59 06/01/2018  2:22 PM  Temp    Pulse 93 06/01/2018  2:23 PM  Resp 11 06/01/2018  2:23 PM  SpO2 97 % 06/01/2018  2:23 PM  Vitals shown include unvalidated device data.  Last Pain:  Vitals:   06/01/18 0915  TempSrc:   PainSc: 3          Complications: No apparent anesthesia complications

## 2018-06-01 NOTE — Interval H&P Note (Signed)
History and Physical Interval Note:  06/01/2018 9:34 AM  Emma Lewis  has presented today for surgery, with the diagnosis of periprosthetic femur fracture left  The various methods of treatment have been discussed with the patient and family. After consideration of risks, benefits and other options for treatment, the patient has consented to  Procedure(s): OPEN REDUCTION INTERNAL FIXATION (ORIF) PERIPROSTHETIC FEMUR FRACTURE (Left) as a surgical intervention .  The patient's history has been reviewed, patient examined, no change in status, stable for surgery.  I have reviewed the patient's chart and labs.  Questions were answered to the patient's satisfaction.     Emma Lewis

## 2018-06-01 NOTE — Anesthesia Preprocedure Evaluation (Signed)
Anesthesia Evaluation  Patient identified by MRN, date of birth, ID band Patient awake    Reviewed: Allergy & Precautions, NPO status , Patient's Chart, lab work & pertinent test results  History of Anesthesia Complications (+) PONV  Airway Mallampati: III  TM Distance: <3 FB Neck ROM: Full    Dental no notable dental hx.    Pulmonary neg pulmonary ROS,    Pulmonary exam normal breath sounds clear to auscultation       Cardiovascular hypertension, Normal cardiovascular exam Rhythm:Regular Rate:Normal     Neuro/Psych negative neurological ROS  negative psych ROS   GI/Hepatic Neg liver ROS, GERD  ,  Endo/Other  Hypothyroidism   Renal/GU negative Renal ROS  negative genitourinary   Musculoskeletal negative musculoskeletal ROS (+)   Abdominal   Peds negative pediatric ROS (+)  Hematology negative hematology ROS (+) anemia ,   Anesthesia Other Findings   Reproductive/Obstetrics negative OB ROS                             Anesthesia Physical Anesthesia Plan  ASA: III  Anesthesia Plan: General   Post-op Pain Management:    Induction: Intravenous  PONV Risk Score and Plan: 3 and Ondansetron, Dexamethasone, Treatment may vary due to age or medical condition and Scopolamine patch - Pre-op  Airway Management Planned: Oral ETT  Additional Equipment:   Intra-op Plan:   Post-operative Plan: Extubation in OR  Informed Consent: I have reviewed the patients History and Physical, chart, labs and discussed the procedure including the risks, benefits and alternatives for the proposed anesthesia with the patient or authorized representative who has indicated his/her understanding and acceptance.   Dental advisory given  Plan Discussed with: CRNA and Surgeon  Anesthesia Plan Comments:         Anesthesia Quick Evaluation

## 2018-06-01 NOTE — Op Note (Signed)
Preop diagnosis: Left periprosthetic femur shaft fracture post femoral stem revision with hip subluxation.  Postop diagnosis: Same  Procedure: ORIF femoral shaft fracture with Zimmer plate proximal femur 15 hole with narrow trochanteric attachment.  ORIF greater trochanter.  Open reduction left total hip arthroplasty.  Surgeon: Rodell Perna, MD  Assistant: Benjiman Core, PA-C medically necessary and present for the entire procedure  Anesthesia: General plus [place] local.  Complications: None  Brief history 67 year old female had previous total hip arthroplasty done in [place] with the femoral stem prosthesis had been recalled.  She had black metallic debris filling up her hip and had femoral revision which required a extended trochanteric osteotomy to remove the stem there was extensive ostial lysis proximally and patient had a 10 inch AML placed at the time of her revision which was done 04/29/2018.  She was doing some therapy trying to work on getting and out of a car and had increased pain in her hip.  X-rays demonstrate hip subluxation and rotation of the femoral stem with loss of appropriate version shortening.  Procedure: After induction general anesthesia patient placed lateral position with axillary roll.  At the end of the case patient had Foley catheter placed with 400 cc urine.  Initially in the lateral position careful padding to the down leg.  1015 drape was applied prepping all the way to the toes with split sheets drapes total hip impervious stockinette Covan and Betadine Steri-Drape x2 timeout procedure was completed Ancef was given and was redosed at 3 hours.  Posterior approach was reopened and was extended down almost to the knee.  Meticulous hemostasis splitting the tensor fascia.  There was still some black stained tissue despite previous debridement at the time of her surgery on 04/29/2018 and there was serosanguineous blood in the joint with subluxation of the ball  posteriorly.  Trochanter was mobile and stem and rotated.  Cables were cut and then following distally along the shaft until the femur fracture was exposed hip was flex stem was removed fracture was reduced after having to take down the fracture with a tiny curette.  To [place] claw clamps were placed followed by selection of the long Zimmer cable plate that extended down to the supracondylar region of the femur.  Trochanteric attachment was placed and then effort was made with multiple sterile draped C arm images to put the appropriate position and rotation both for version from direct observation as well as trochanteric clamp the correct position was even with the center of the ball.  Trochanter took a considerable amount of time to mobilize it since it was anterior and proximal finally began to work its way down using towel clips to satisfactory position and multiple cables were placed.  With the plate in place held with the Lowman clamp distal cables were passed x2 and then blue screw in cable buttons were placed.  A total of 9 or 10 cables were passed both around the femoral shaft as well as proximally around the trochanter.  Approximately 3 cortical screws were placed in the trochanteric trochanter was poor quality bone due to the patient's ostial lysis from previous metal debris.  Distally the 3 most distal screws were filled with bicortical screws with the bridge.  AP lateral fluoroscopy was taken hip was able be flexed to 90 degrees and internally rotated to 80 degrees without loss of fixation.  40 cc of chips were placed and there was still gap for the trochanteric slide osteotomy had some gap and bone  chips were packed in this area.  Copious irrigation re-dose of antibiotics.  Reapproximation vastus lateralis with 2-0 Vicryl.  #1 Vicryl in the tensor fascia and gluteus maximus fascia.  Toe Vicryl subtenons tissue skin staple closure postop dressing and knee immobilizer.  Leg lengths are equal.  Patient  tolerated procedure well transfer the care of room stable condition.

## 2018-06-02 LAB — BASIC METABOLIC PANEL
ANION GAP: 4 — AB (ref 5–15)
BUN: 6 mg/dL — ABNORMAL LOW (ref 8–23)
CO2: 25 mmol/L (ref 22–32)
CREATININE: 0.73 mg/dL (ref 0.44–1.00)
Calcium: 9.2 mg/dL (ref 8.9–10.3)
Chloride: 110 mmol/L (ref 98–111)
Glucose, Bld: 129 mg/dL — ABNORMAL HIGH (ref 70–99)
Potassium: 4.1 mmol/L (ref 3.5–5.1)
Sodium: 139 mmol/L (ref 135–145)

## 2018-06-02 LAB — CBC
HEMATOCRIT: 31.1 % — AB (ref 36.0–46.0)
Hemoglobin: 9.7 g/dL — ABNORMAL LOW (ref 12.0–15.0)
MCH: 28.4 pg (ref 26.0–34.0)
MCHC: 31.2 g/dL (ref 30.0–36.0)
MCV: 91.2 fL (ref 80.0–100.0)
NRBC: 0 % (ref 0.0–0.2)
PLATELETS: 284 10*3/uL (ref 150–400)
RBC: 3.41 MIL/uL — ABNORMAL LOW (ref 3.87–5.11)
RDW: 15 % (ref 11.5–15.5)
WBC: 8.8 10*3/uL (ref 4.0–10.5)

## 2018-06-02 MED ORDER — MORPHINE SULFATE (PF) 2 MG/ML IV SOLN
2.0000 mg | INTRAVENOUS | Status: DC | PRN
  Administered 2018-06-02: 3 mg via INTRAVENOUS
  Administered 2018-06-02: 2 mg via INTRAVENOUS
  Administered 2018-06-03 (×3): 3 mg via INTRAVENOUS
  Administered 2018-06-06: 2 mg via INTRAVENOUS
  Filled 2018-06-02 (×2): qty 2
  Filled 2018-06-02: qty 1
  Filled 2018-06-02: qty 2
  Filled 2018-06-02: qty 1
  Filled 2018-06-02: qty 2

## 2018-06-02 NOTE — Care Management Important Message (Signed)
Important Message  Patient Details  Name: Emma Lewis MRN: [id number] Date of Birth: [birth year]   Medicare Important Message Given:  Yes    [name] 06/02/2018, 3:29 PM

## 2018-06-02 NOTE — Progress Notes (Signed)
PT Cancellation Note  Patient Details Name: Emma Lewis MRN: [id number] DOB: 1950/11/20   Cancelled Treatment:    Reason Eval/Treat Not Completed: Patient not medically ready per chart review/surgeon's note, hold PT until 06/03/18. Will hold therapy today and continue to follow, plan to initiate PT when patient medically ready and appropriate.    Deniece Ree PT, DPT, CBIS  Supplemental Physical Therapist [facility]    Pager [phone] Acute Rehab Office [phone]

## 2018-06-02 NOTE — Progress Notes (Signed)
Subjective: 1 Day Post-Op Procedure(s) (LRB): OPEN REDUCTION INTERNAL FIXATION (ORIF) PERIPROSTHETIC FEMUR FRACTURE (Left) Patient reports pain as moderate and severe.  Severe pain and muscle spasms with movement  Objective: Vital signs in last 24 hours: Temp:  [97 F (36.1 C)-98.2 F (36.8 C)] 98.2 F (36.8 C) (11/05 0441) Pulse Rate:  [86-103] 102 (11/05 0441) Resp:  [10-16] 16 (11/05 0441) BP: (102-129)/(59-85) 114/65 (11/05 0441) SpO2:  [94 %-99 %] 96 % (11/05 0441)  Intake/Output from previous day: 11/04 0701 - 11/05 0700 In: 3333.3 [I.V.:2215; IV Piggyback:1118.3] Out: 600 [Blood:600] Intake/Output this shift: No intake/output data recorded.  Recent Labs    06/01/18 1138 06/01/18 1245 06/02/18 0112  HGB 10.9* 10.5* 9.7*   Recent Labs    06/01/18 1245 06/02/18 0112  WBC  --  8.8  RBC  --  3.41*  HCT 31.0* 31.1*  PLT  --  284   Recent Labs    06/01/18 1245 06/02/18 0112  NA 138 139  K 4.3 4.1  CL  --  110  CO2  --  25  BUN  --  6*  CREATININE  --  0.73  GLUCOSE 129* 129*  CALCIUM  --  9.2   No results for input(s): LABPT, INR in the last 72 hours.  Neurologically intact sciatic intact . Leg length equal .  Dg C-arm 1-60 Min  Result Date: 06/01/2018 CLINICAL DATA:  ORIF of the left femur EXAM: DG C-ARM 61-120 MIN; LEFT FEMUR 2 VIEWS FLUOROSCOPY TIME:  21 seconds COMPARISON:  Left femur radiographs-05/29/2018; left hip radiographs-05/29/2018; left knee radiographs-05/07/2018 FINDINGS: Six spot intraoperative fluoroscopic images of the left femur are provided for review. Images demonstrate the sequela of long segment sideplate fixation of periprosthetic femur fracture. The sideplate is transfixed with multiple cancellous screws as well as the application of additional cerclage wires. Alignment appears anatomic given coned field of view. Stable sequela of prior left total hip replacement. There is a minimal amount of expected subcutaneous emphysema  scattered about the operative site. No radiopaque foreign body. IMPRESSION: Post sideplate fixation of periprosthetic femur fracture without evidence of complication as detailed above. Electronically Signed   By: Sandi Mariscal M.D.   On: 06/01/2018 14:12   Dg C-arm 1-60 Min  Result Date: 06/01/2018 CLINICAL DATA:  ORIF of the left femur EXAM: DG C-ARM 61-120 MIN; LEFT FEMUR 2 VIEWS FLUOROSCOPY TIME:  21 seconds COMPARISON:  Left femur radiographs-05/29/2018; left hip radiographs-05/29/2018; left knee radiographs-05/07/2018 FINDINGS: Six spot intraoperative fluoroscopic images of the left femur are provided for review. Images demonstrate the sequela of long segment sideplate fixation of periprosthetic femur fracture. The sideplate is transfixed with multiple cancellous screws as well as the application of additional cerclage wires. Alignment appears anatomic given coned field of view. Stable sequela of prior left total hip replacement. There is a minimal amount of expected subcutaneous emphysema scattered about the operative site. No radiopaque foreign body. IMPRESSION: Post sideplate fixation of periprosthetic femur fracture without evidence of complication as detailed above. Electronically Signed   By: Sandi Mariscal M.D.   On: 06/01/2018 14:12   Dg Hip Port Unilat With Pelvis 1v Left  Result Date: 06/01/2018 CLINICAL DATA:  Left periprosthetic femur fracture ORIF. EXAM: DG HIP (WITH OR WITHOUT PELVIS) 1V PORT LEFT COMPARISON:  Left hip x-rays dated April 29, 2018. FINDINGS: Interval lateral sideplate and screw fixation of the left proximal periprosthetic femur fracture. Hardware is intact. Unchanged left total hip arthroplasty. Normal alignment. Postsurgical changes in  the soft tissues of the left thigh. IMPRESSION: Status post left proximal periprosthetic femur fracture ORIF without acute postoperative complication. Electronically Signed   By: Titus Dubin M.D.   On: 06/01/2018 15:09   Dg Femur Min 2  Views Left  Result Date: 06/01/2018 CLINICAL DATA:  ORIF of the left femur EXAM: DG C-ARM 61-120 MIN; LEFT FEMUR 2 VIEWS FLUOROSCOPY TIME:  21 seconds COMPARISON:  Left femur radiographs-05/29/2018; left hip radiographs-05/29/2018; left knee radiographs-05/07/2018 FINDINGS: Six spot intraoperative fluoroscopic images of the left femur are provided for review. Images demonstrate the sequela of long segment sideplate fixation of periprosthetic femur fracture. The sideplate is transfixed with multiple cancellous screws as well as the application of additional cerclage wires. Alignment appears anatomic given coned field of view. Stable sequela of prior left total hip replacement. There is a minimal amount of expected subcutaneous emphysema scattered about the operative site. No radiopaque foreign body. IMPRESSION: Post sideplate fixation of periprosthetic femur fracture without evidence of complication as detailed above. Electronically Signed   By: Sandi Mariscal M.D.   On: 06/01/2018 14:12    Assessment/Plan: 1 Day Post-Op Procedure(s) (LRB): OPEN REDUCTION INTERNAL FIXATION (ORIF) PERIPROSTHETIC FEMUR FRACTURE (Left) Plan:  Will hold therapy until tomorrow. Severe pain and muscle spasms. Multiple cables screws and plate from hip to knee. Will start therapy tomorrow.   Emma Lewis 06/02/2018, 7:58 AM

## 2018-06-02 NOTE — Clinical Social Work Note (Signed)
Clinical Social Work Assessment  Patient Details  Name: Emma Lewis MRN: [id number] Date of Birth: [birth year]  Date of referral:  06/02/18               Reason for consult:  Facility Placement                Permission sought to share information with:  Case Manager, Family Supports Permission granted to share information::     Name::     Rewa Weissberg   Agency::     Relationship::  spouse  Contact Information:  [phone]  Housing/Transportation Living arrangements for the past 2 months:  Single Family Home Source of Information:  Patient Patient Interpreter Needed:  None Criminal Activity/Legal Involvement Pertinent to Current Situation/Hospitalization:  No - Comment as needed Significant Relationships:  Spouse Lives with:  Spouse Do you feel safe going back to the place where you live?  Yes Need for family participation in patient care:  Yes (Comment)  Care giving concerns: Patient was alert and or oriented. CSW spoke with patient regarding possible SNF placement, if recommended. Patient states she was home only for two week before being admitted back into the hospital. She lives in a single level home with her spouse. She states during the day she has friend that can come and assist her as needed. Patient expressed at this current time she would like to go home once discharged.     Social Worker assessment / plan:  CSW spoke with patient concerning possibility of rehab at [facility] before returning home.   Employment status:  Retired Forensic scientist:  Medicare PT Recommendations:  Not assessed at this time Information / Referral to community resources:     Patient/Family's Response to care: Patient states she is currently on too much pain to work with PT today but she will be seen by them tomorrow. She hopes to have her pain manage enough so that she is able to transition into a chair.    Patient/Family's Understanding of and Emotional Response to Diagnosis, Current  Treatment, and Prognosis:  Patient was pleasant and very knowledgeable about her medical condition.  Patient expressed understanding of CSW role and discharge process as well as medical condition. No questions/concerns about plan or treatment.    Emotional Assessment Appearance:  Appears older than stated age Attitude/Demeanor/Rapport:  Engaged, Self-Confident Affect (typically observed):  Pleasant, Appropriate, Calm Orientation:  Oriented to Self, Oriented to Place, Oriented to  Time, Oriented to Situation Alcohol / Substance use:  Not Applicable Psych involvement (Current and /or in the community):  No (Comment)  Discharge Needs  Concerns to be addressed:  Care Coordination Readmission within the last 30 days:  Yes Current discharge risk:  Dependent with Mobility Barriers to Discharge:  Continued Medical Work up   [employer], [place] 06/02/2018, 2:18 PM

## 2018-06-02 NOTE — Care Management Note (Signed)
Case Management Note  Patient Details  Name: LATESE DUFAULT MRN: [id number] Date of Birth: 11-Nov-1950  Subjective/Objective:  Left Femur Fx, s/p ORIF   [phone], fax [phone]                 Action/Plan: NCM spoke to pt and she is active with [facility] in [place] # [phone], fax [phone]. She has RW and 3n1 bedside commode, wheelchair (rental), crutches, and cane. Husband at home to assist with care. [place]. Will need new orders for HHPT/OT with F2F. Message left for attending.   Expected Discharge Date:                 Expected Discharge Plan:  [place]  In-House Referral:  NA  Discharge planning Services  CM Consult  Post Acute Care Choice:  Home Health Choice offered to:  Patient  DME Arranged:  N/A DME Agency:  NA  HH Arranged:  [facility] Agency:  [place]  Status of Service:  In process, will continue to follow  If discussed at Long Length of Stay Meetings, dates discussed:    Additional Comments:  Erenest Rasher, RN 06/02/2018, 4:24 PM

## 2018-06-03 ENCOUNTER — Inpatient Hospital Stay (HOSPITAL_COMMUNITY): Payer: Medicare Other

## 2018-06-03 LAB — CBC
HCT: 29 % — ABNORMAL LOW (ref 36.0–46.0)
HEMOGLOBIN: 9 g/dL — AB (ref 12.0–15.0)
MCH: 28.3 pg (ref 26.0–34.0)
MCHC: 31 g/dL (ref 30.0–36.0)
MCV: 91.2 fL (ref 80.0–100.0)
NRBC: 0 % (ref 0.0–0.2)
PLATELETS: 276 10*3/uL (ref 150–400)
RBC: 3.18 MIL/uL — AB (ref 3.87–5.11)
RDW: 15.1 % (ref 11.5–15.5)
WBC: 11 10*3/uL — ABNORMAL HIGH (ref 4.0–10.5)

## 2018-06-03 NOTE — Progress Notes (Signed)
   Subjective: 2 Days Post-Op Procedure(s) (LRB): OPEN REDUCTION INTERNAL FIXATION (ORIF) PERIPROSTHETIC FEMUR FRACTURE (Left) Patient reports pain as moderate and severe.    Objective: Vital signs in last 24 hours: Temp:  [98.4 F (36.9 C)-100.8 F (38.2 C)] 100.8 F (38.2 C) (11/06 0305) Pulse Rate:  [98-107] 106 (11/06 0305) Resp:  [14-78] 14 (11/06 0305) BP: (104-124)/(50-62) 110/54 (11/06 0305) SpO2:  [92 %-96 %] 95 % (11/06 0305)  Intake/Output from previous day: 11/05 0701 - 11/06 0700 In: 720 [P.O.:720] Out: 800 [Urine:800] Intake/Output this shift: Total I/O In: -  Out: 700 [Urine:700]  Recent Labs    06/01/18 1138 06/01/18 1245 06/02/18 0112 06/03/18 0242  HGB 10.9* 10.5* 9.7* 9.0*   Recent Labs    06/02/18 0112 06/03/18 0242  WBC 8.8 11.0*  RBC 3.41* 3.18*  HCT 31.1* 29.0*  PLT 284 276   Recent Labs    06/01/18 1245 06/02/18 0112  NA 138 139  K 4.3 4.1  CL  --  110  CO2  --  25  BUN  --  6*  CREATININE  --  0.73  GLUCOSE 129* 129*  CALCIUM  --  9.2   No results for input(s): LABPT, INR in the last 72 hours.  sciatic intact function. left LE in some external rotation. minimal pain with logroll of  left hip.  No results found.  Assessment/Plan: 2 Days Post-Op Procedure(s) (LRB): OPEN REDUCTION INTERNAL FIXATION (ORIF) PERIPROSTHETIC FEMUR FRACTURE (Left) Plan:   Will check xrays before beginning mobilization to chair.   Emma Lewis 06/03/2018, 7:45 AM

## 2018-06-03 NOTE — Progress Notes (Signed)
X-rays today are reviewed satisfactory position alignment.  Proceed with mobilization bed to chair and bed to wheelchair mobilization.

## 2018-06-03 NOTE — Evaluation (Signed)
Physical Therapy Evaluation Patient Details Name: Emma Lewis MRN: [id number] DOB: 27-Jul-1951 Today's Date: 06/03/2018   History of Present Illness  Pt is a 67 y/o female admitted secondary to L hip dislocation and periprosthetic fracture. Pt is now s/p ORIF of L femur periprosthetic fracture. PMH includes L total hip revision.  Clinical Impression  Pt admitted secondary to problem above with deficits below. Pt requiring max A +2 to come to sitting at EOB and to return to supine. Pt limited in mobility tolerance secondary to pain and required extended time to perform bed mobility. Pt reports she just got x-ray and is in increased pain from x ray. Will likely progress well once pain controlled. Anticipate pt will require post acute rehab following d/c and will update recommendations based on mobility progression and tolerance. Will continue to follow acutely to maximize functional mobility independence and safety.     Follow Up Recommendations Supervision/Assistance - 24 hour;Other (comment)(CIR vs SNF pending progression )    Equipment Recommendations  None recommended by PT    Recommendations for Other Services OT consult     Precautions / Restrictions Precautions Precautions: Posterior Hip;Fall Precaution Booklet Issued: Yes (comment) Precaution Comments: Reviewed posterior hip and NWB precautions with pt.  Restrictions Weight Bearing Restrictions: Yes LLE Weight Bearing: Non weight bearing Other Position/Activity Restrictions: Strict NWB per orders      Mobility  Bed Mobility Overal bed mobility: Needs Assistance Bed Mobility: Supine to Sit;Sit to Supine     Supine to sit: Max assist;+2 for physical assistance Sit to supine: Max assist;+2 for physical assistance   General bed mobility comments: Max A +2 for bed mobility. Pt extremely slow and guarded to move to EOB. Unable to assist with bed pad near incision site as pt would yell out. Cues to maintain hip precautions  throughout. Pt requesting to hold on standing secondary to increased pain.   Transfers                    Ambulation/Gait                Stairs            Wheelchair Mobility    Modified Rankin (Stroke Patients Only)       Balance Overall balance assessment: Needs assistance Sitting-balance support: Bilateral upper extremity supported;Feet supported Sitting balance-Leahy Scale: Poor Sitting balance - Comments: Reliant on UE support to maintain sitting balance.                                      Pertinent Vitals/Pain Pain Assessment: Faces Faces Pain Scale: Hurts worst Pain Location: L hip  Pain Descriptors / Indicators: Aching;Grimacing;Guarding;Operative site guarding Pain Intervention(s): Limited activity within patient's tolerance;Monitored during session;Repositioned    Home Living Family/patient expects to be discharged to:: Private residence Living Arrangements: Spouse/significant other Available Help at Discharge: Family;Available 24 hours/day Type of Home: House Home Access: Stairs to enter Entrance Stairs-Rails: Left Entrance Stairs-Number of Steps: 3 in front, 8 in back Home Layout: One level Home Equipment: Walker - 2 wheels;Cane - single point;Bedside commode;Crutches;Shower seat - built in      Prior Function Level of Independence: Independent with assistive device(s)         Comments: Was using cane for ambulation      Hand Dominance        Extremity/Trunk Assessment   Upper Extremity  Assessment Upper Extremity Assessment: Defer to OT evaluation    Lower Extremity Assessment Lower Extremity Assessment: LLE deficits/detail LLE Deficits / Details: Deficits consistent with post op pain and weakness. Very limited ROM secondary to pain.     Cervical / Trunk Assessment Cervical / Trunk Assessment: Kyphotic  Communication   Communication: No difficulties  Cognition Arousal/Alertness:  Awake/alert Behavior During Therapy: WFL for tasks assessed/performed Overall Cognitive Status: Within Functional Limits for tasks assessed                                        General Comments      Exercises     Assessment/Plan    PT Assessment Patient needs continued PT services  PT Problem List Decreased strength;Decreased range of motion;Decreased activity tolerance;Decreased balance;Decreased mobility;Decreased knowledge of use of DME;Decreased knowledge of precautions;Pain       PT Treatment Interventions DME instruction;Therapeutic activities;Functional mobility training;Gait training;Therapeutic exercise;Balance training;Patient/family education    PT Goals (Current goals can be found in the Care Plan section)  Acute Rehab PT Goals Patient Stated Goal: to decrease pain  PT Goal Formulation: With patient Time For Goal Achievement: 06/17/18 Potential to Achieve Goals: Fair    Frequency Min 5X/week   Barriers to discharge        Co-evaluation               AM-PAC PT "6 Clicks" Daily Activity  Outcome Measure Difficulty turning over in bed (including adjusting bedclothes, sheets and blankets)?: Unable Difficulty moving from lying on back to sitting on the side of the bed? : Unable Difficulty sitting down on and standing up from a chair with arms (e.g., wheelchair, bedside commode, etc,.)?: Unable Help needed moving to and from a bed to chair (including a wheelchair)?: Total Help needed walking in hospital room?: Total Help needed climbing 3-5 steps with a railing? : Total 6 Click Score: 6    End of Session   Activity Tolerance: Patient limited by pain Patient left: in bed;with call bell/phone within reach;with bed alarm set Nurse Communication: Mobility status;Patient requests pain meds PT Visit Diagnosis: Unsteadiness on feet (R26.81);Muscle weakness (generalized) (M62.81);Difficulty in walking, not elsewhere classified  (R26.2);Pain Pain - Right/Left: Left Pain - part of body: Hip    Time: 0071-2197 PT Time Calculation (min) (ACUTE ONLY): 30 min   Charges:   PT Evaluation $PT Eval Moderate Complexity: 1 Mod PT Treatments $Therapeutic Activity: 8-22 mins        Leighton Ruff, PT, DPT  Acute Rehabilitation Services  Pager: [phone] Office: [phone]   Rudean Hitt 06/03/2018, 2:02 PM

## 2018-06-04 ENCOUNTER — Telehealth (INDEPENDENT_AMBULATORY_CARE_PROVIDER_SITE_OTHER): Payer: Self-pay

## 2018-06-04 ENCOUNTER — Encounter (HOSPITAL_COMMUNITY): Payer: Self-pay | Admitting: Orthopaedic Surgery

## 2018-06-04 LAB — CBC
HEMATOCRIT: 27.8 % — AB (ref 36.0–46.0)
HEMOGLOBIN: 8.5 g/dL — AB (ref 12.0–15.0)
MCH: 28 pg (ref 26.0–34.0)
MCHC: 30.6 g/dL (ref 30.0–36.0)
MCV: 91.4 fL (ref 80.0–100.0)
Platelets: 264 10*3/uL (ref 150–400)
RBC: 3.04 MIL/uL — ABNORMAL LOW (ref 3.87–5.11)
RDW: 15.1 % (ref 11.5–15.5)
WBC: 11.2 10*3/uL — AB (ref 4.0–10.5)
nRBC: 0 % (ref 0.0–0.2)

## 2018-06-04 NOTE — Plan of Care (Signed)

## 2018-06-04 NOTE — Progress Notes (Signed)
Patient ID: Emma Lewis, female   DOB: 30-Apr-1951, 67 y.o.   MRN: [id number] Husband called me concerning that they are trying to push her out of the hospital for she is able to transfer to a bed safely and not be a fall risk.  I discussed with the patient that the plan is for when she is able to safely transfer from bed to wheelchair bed to chair and he would be able to help her then she would be at able to be discharged.  Reach that point at this point is not ready for discharge.  We will see how she does with therapy.  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  If she can make it 6 weeks being very gentle transferring back and forth to a chair then she should have time to consolidate her fractures and then we could begin more aggressive therapy.  I told her husband to continue to follow her carefully and will not discharge her until I feel that she is ready.

## 2018-06-04 NOTE — Telephone Encounter (Signed)
Please see below and advise.

## 2018-06-04 NOTE — Telephone Encounter (Signed)
Pt's husband called very upset states that physical therapy is coming into the room and trying to "make her do more than Dr. Lorin Mercy had advised she can do" he states that she had surgery on Monday and that they are going to " kick her out" of the hospital tomorrow. The husband is concerned the pt is going to disrupt the surgery and that there is no plan for her after she leaves the hospital. Wants a call back to discuss treatment urgently cb # [phone] pt is at [facility] RM 5

## 2018-06-04 NOTE — Evaluation (Signed)
Occupational Therapy Evaluation Patient Details Name: Emma Lewis MRN: [id number] DOB: Aug 18, 1950 Today's Date: 06/04/2018    History of Present Illness Pt is a 67 y/o female admitted secondary to L hip dislocation and periprosthetic fracture. Pt is now s/p ORIF of L femur periprosthetic fracture. PMH includes L total hip revision.   Clinical Impression   PTA Pt recently discharged from [place] (10/18) and functioning at home at Mod I level. Pt is currently max A +2 for functional transfers requiring max vc for WB precautions and VERY limited in her ability to perform transfers (limited by pain and generalized weakness). She currently requires assist to move her LLE in bed, let alone perform any kind of gross motor movement. She is max A +2 for all LB ADL due to her posterior hip precautions. Pt is determined to go home, but at this time it is therapy's opinion that she requires SNF level therapy post-acute for safety and to get her to a level where she can perform basic transfers (wheelchair level) and maintain her WB precautions. OT will continue to follow acutely.     Follow Up Recommendations  SNF;Supervision/Assistance - 24 hour    Equipment Recommendations       Recommendations for Other Services       Precautions / Restrictions Precautions Precautions: Posterior Hip;Fall Precaution Booklet Issued: Yes (comment) Precaution Comments: Reviewed precautions with pt; Post hip precautions posted in room  Restrictions Weight Bearing Restrictions: Yes LLE Weight Bearing: Non weight bearing Other Position/Activity Restrictions: NWB order still active. per Dr. Lorin Mercy note 11/07 pt is TDWB LE      Mobility Bed Mobility Overal bed mobility: Needs Assistance Bed Mobility: Sit to Supine     Supine to sit: Mod assist;HOB elevated;+2 for safety/equipment Sit to supine: Max assist;+2 for physical assistance   General bed mobility comments: cues for sequencing and maintaining all precautions;  assistance required to scoot hips with use of bed pad and to bring L LE into bed and then to position once in bed; pt able to assist with moving hips once in supine    Transfers Overall transfer level: Needs assistance Equipment used: Rolling walker (2 wheeled) Transfers: Sit to/from [facility] Sit to Stand: Max assist;+2 physical assistance Stand pivot transfers: Mod assist;Max assist;+2 physical assistance       General transfer comment: +2 to power up into standing and to maintain TDWB during transitions; mulitmodal cues for hip extension and posture to be able to offload L LE in standing; assistance for balance and to mobilize RW; heavy bilat UE support required; cues for sequencing and technique     Balance Overall balance assessment: Needs assistance Sitting-balance support: Bilateral upper extremity supported;Feet supported Sitting balance-Leahy Scale: Poor Sitting balance - Comments: Reliant on UE support to maintain sitting balance.    Standing balance support: Bilateral upper extremity supported;During functional activity Standing balance-Leahy Scale: Poor Standing balance comment: external support required                           ADL either performed or assessed with clinical judgement   ADL Overall ADL's : Needs assistance/impaired Eating/Feeding: Modified independent   Grooming: Set up;Sitting   Upper Body Bathing: Set up;Sitting   Lower Body Bathing: Maximal assistance   Upper Body Dressing : Set up;Sitting   Lower Body Dressing: Maximal assistance;+2 for physical assistance;+2 for safety/equipment   Toilet Transfer: Maximal assistance;+2 for physical assistance;+2 for safety/equipment;Stand-pivot;BSC;RW Toilet  Transfer Details (indicate cue type and reason): simulated through return to bed from recliner - Pt requires max cues for sequencing/safety/WB and use of RW Toileting- Clothing Manipulation and Hygiene: Total assistance        Functional mobility during ADLs: Maximal assistance;+2 for physical assistance;+2 for safety/equipment(SPT only) General ADL Comments: posterior hip precaution compensatory strategies reviewed for ADL     Vision         Perception     Praxis      Pertinent Vitals/Pain Pain Assessment: Faces Faces Pain Scale: Hurts whole lot Pain Location: L hip  Pain Descriptors / Indicators: Aching;Grimacing;Guarding;Tightness;Sore Pain Intervention(s): Limited activity within patient's tolerance;Repositioned;Ice applied     Hand Dominance Right   Extremity/Trunk Assessment Upper Extremity Assessment Upper Extremity Assessment: Overall WFL for tasks assessed   Lower Extremity Assessment Lower Extremity Assessment: LLE deficits/detail LLE Deficits / Details: Deficits consistent with post op pain and weakness. Very limited ROM secondary to pain.    Cervical / Trunk Assessment Cervical / Trunk Assessment: Kyphotic   Communication Communication Communication: No difficulties   Cognition Arousal/Alertness: Awake/alert Behavior During Therapy: WFL for tasks assessed/performed Overall Cognitive Status: Impaired/Different from baseline Area of Impairment: Safety/judgement                         Safety/Judgement: Decreased awareness of safety;Decreased awareness of deficits     General Comments: Pt is adament about going home however requires +2 max A for transfers at this time   General Comments       Exercises     Shoulder Instructions      Home Living Family/patient expects to be discharged to:: Private residence Living Arrangements: Spouse/significant other Available Help at Discharge: Family;Available 24 hours/day Type of Home: House Home Access: Stairs to enter [employer] of Steps: 3 in front, 8 in back Entrance Stairs-Rails: Left Home Layout: One level     Bathroom Shower/Tub: Occupational psychologist: Handicapped height Bathroom  Accessibility: Yes How Accessible: Accessible via walker Home Equipment: [place] - 2 wheels;Cane - single point;Bedside commode;Crutches;Shower seat - built in      Lives With: Spouse    Prior Functioning/Environment Level of Independence: Independent with assistive device(s)        Comments: recent dc from [facility] on 10/18        OT Problem List: Decreased range of motion;Decreased activity tolerance;Impaired balance (sitting and/or standing);Decreased safety awareness;Decreased knowledge of use of DME or AE;Decreased knowledge of precautions;Obesity;Pain;Increased edema      OT Treatment/Interventions:      OT Goals(Current goals can be found in the care plan section) Acute Rehab OT Goals Patient Stated Goal: to decrease pain  OT Goal Formulation: With patient Time For Goal Achievement: 06/18/18 Potential to Achieve Goals: Good  OT Frequency: Min 2X/week   Barriers to D/C: Inaccessible home environment  Pt has 3 steps to get up       Co-evaluation PT/OT/SLP Co-Evaluation/Treatment: Yes Reason for Co-Treatment: For patient/therapist safety;To address functional/ADL transfers PT goals addressed during session: Mobility/safety with mobility;Balance;Proper use of DME;Strengthening/ROM OT goals addressed during session: ADL's and self-care;Proper use of Adaptive equipment and DME;Strengthening/ROM      AM-PAC PT "6 Clicks" Daily Activity     Outcome Measure Help from another person eating meals?: None Help from another person taking care of personal grooming?: None(in sitting) Help from another person toileting, which includes using toliet, bedpan, or urinal?: A Lot Help from another person  bathing (including washing, rinsing, drying)?: A Lot Help from another person to put on and taking off regular upper body clothing?: A Little Help from another person to put on and taking off regular lower body clothing?: A Lot 6 Click Score: 17   End of Session Equipment Utilized During  Treatment: Gait belt;Rolling walker Nurse Communication: Mobility status;Precautions;Weight bearing status  Activity Tolerance: Patient limited by pain;Patient limited by fatigue Patient left: in bed;with call bell/phone within reach;with bed alarm set;with SCD's reapplied  OT Visit Diagnosis: Unsteadiness on feet (R26.81);Other abnormalities of gait and mobility (R26.89);Pain Pain - Right/Left: Left Pain - part of body: Hip                Time: 2162-4469 OT Time Calculation (min): 32 min Charges:  OT General Charges $OT Visit: 1 Visit OT Evaluation $OT Eval Moderate Complexity: [place] OTR/L Acute Rehabilitation Services Pager: [phone] Office: [place] 06/04/2018, 5:10 PM

## 2018-06-04 NOTE — Telephone Encounter (Signed)
I called discussed and did note.

## 2018-06-04 NOTE — Progress Notes (Signed)
Physical Therapy Treatment Patient Details Name: Emma Lewis MRN: [id number] DOB: 1951/02/27 Today's Date: 06/04/2018    History of Present Illness Pt is a 67 y/o female admitted secondary to L hip dislocation and periprosthetic fracture. Pt is now s/p ORIF of L femur periprosthetic fracture. PMH includes L total hip revision.    PT Comments    Patient seen second session for transfer training and bed mobility. Pt requires mod/max A +2 for functional transfers and returning to bed to maintain weight bearing status and posterior hip precautions. Pt fatigued after transfer. Pt has 3-8 stairs to enter home and will likely need ambulance transfer home if that is the plan. Discussed with pt the need to go to [facility] with nursing staff instead of using [facility] if she plans to d/c home. Given pt's current mobility level continue to recommend post acute rehab to maximize independence and safety with mobility prior to d/c home.     Follow Up Recommendations  Supervision/Assistance - 24 hour;Other (comment)(CIR vs SNF pending progression )     Equipment Recommendations  None recommended by PT    Recommendations for Other Services OT consult     Precautions / Restrictions Precautions Precautions: Posterior Hip;Fall Precaution Booklet Issued: Yes (comment) Precaution Comments: Reviewed precautions with pt; Post hip precautions posted in room  Restrictions Weight Bearing Restrictions: Yes LLE Weight Bearing: Non weight bearing Other Position/Activity Restrictions: NWB order still active. per Dr. Lorin Mercy note 11/07 pt is TDWB LE    Mobility  Bed Mobility Overal bed mobility: Needs Assistance Bed Mobility: Sit to Supine     Supine to sit: Mod assist;HOB elevated;+2 for safety/equipment Sit to supine: Max assist;+2 for physical assistance   General bed mobility comments: cues for sequencing and maintaining all precautions; assistance required to scoot hips with use of bed pad and to bring L LE  into bed and then to position once in bed; pt able to assist with moving hips once in supine    Transfers Overall transfer level: Needs assistance Equipment used: Rolling walker (2 wheeled) Transfers: Sit to/from [facility] Sit to Stand: Max assist;+2 physical assistance Stand pivot transfers: Mod assist;Max assist;+2 physical assistance       General transfer comment: +2 to power up into standing and to maintain TDWB during transitions; mulitmodal cues for hip extension and posture to be able to offload L LE in standing; assistance for balance and to mobilize RW; heavy bilat UE support required; cues for sequencing and technique   Ambulation/Gait                 Stairs             Wheelchair Mobility    Modified Rankin (Stroke Patients Only)       Balance Overall balance assessment: Needs assistance Sitting-balance support: Bilateral upper extremity supported;Feet supported Sitting balance-Leahy Scale: Poor Sitting balance - Comments: Reliant on UE support to maintain sitting balance.    Standing balance support: Bilateral upper extremity supported;During functional activity Standing balance-Leahy Scale: Poor Standing balance comment: external support required                            Cognition Arousal/Alertness: Awake/alert Behavior During Therapy: WFL for tasks assessed/performed Overall Cognitive Status: Within Functional Limits for tasks assessed  Exercises      General Comments        Pertinent Vitals/Pain Pain Assessment: Faces Faces Pain Scale: Hurts whole lot Pain Location: L hip  Pain Descriptors / Indicators: Aching;Grimacing;Guarding;Tightness;Sore Pain Intervention(s): Limited activity within patient's tolerance;Monitored during session;Premedicated before session;Repositioned;Ice applied    Home Living                      Prior Function             PT Goals (current goals can now be found in the care plan section) Acute Rehab PT Goals Patient Stated Goal: to decrease pain  Progress towards PT goals: Progressing toward goals    Frequency    Min 5X/week      PT Plan Current plan remains appropriate    Co-evaluation PT/OT/SLP Co-Evaluation/Treatment: Yes Reason for Co-Treatment: For patient/therapist safety;To address functional/ADL transfers PT goals addressed during session: Mobility/safety with mobility;Balance;Proper use of DME;Strengthening/ROM        AM-PAC PT "6 Clicks" Daily Activity  Outcome Measure  Difficulty turning over in bed (including adjusting bedclothes, sheets and blankets)?: Unable Difficulty moving from lying on back to sitting on the side of the bed? : Unable Difficulty sitting down on and standing up from a chair with arms (e.g., wheelchair, bedside commode, etc,.)?: Unable Help needed moving to and from a bed to chair (including a wheelchair)?: A Lot Help needed walking in hospital room?: Total Help needed climbing 3-5 steps with a railing? : Total 6 Click Score: 7    End of Session Equipment Utilized During Treatment: Gait belt Activity Tolerance: Patient limited by pain;Patient limited by fatigue Patient left: with call bell/phone within reach;in bed;with SCD's reapplied Nurse Communication: Mobility status PT Visit Diagnosis: Unsteadiness on feet (R26.81);Muscle weakness (generalized) (M62.81);Difficulty in walking, not elsewhere classified (R26.2);Pain Pain - Right/Left: Left Pain - part of body: Hip     Time: 8828-0034 PT Time Calculation (min) (ACUTE ONLY): 32 min  Charges:  $Therapeutic Activity: 8-22 mins                     Earney Navy, PTA Acute Rehabilitation Services Pager: [phone] Office: [phone]     Darliss Cheney 06/04/2018, 3:48 PM

## 2018-06-04 NOTE — Progress Notes (Signed)
   Subjective: 3 Days Post-Op Procedure(s) (LRB): OPEN REDUCTION INTERNAL FIXATION (ORIF) PERIPROSTHETIC FEMUR FRACTURE (Left) Patient reports pain as no pain when not moving. moderate to severe with mobility. .    Objective: Vital signs in last 24 hours: Temp:  [98.3 F (36.8 C)-99.4 F (37.4 C)] 99.4 F (37.4 C) (11/07 0508) Pulse Rate:  [92-103] 92 (11/07 0508) Resp:  [20] 20 (11/07 0508) BP: (103-108)/(53-56) 103/56 (11/07 0508) SpO2:  [95 %-97 %] 95 % (11/07 0508)  Intake/Output from previous day: 11/06 0701 - 11/07 0700 In: 480 [P.O.:480] Out: 2100 [Urine:2100] Intake/Output this shift: No intake/output data recorded.  Recent Labs    06/01/18 1138 06/01/18 1245 06/02/18 0112 06/03/18 0242 06/04/18 0227  HGB 10.9* 10.5* 9.7* 9.0* 8.5*   Recent Labs    06/03/18 0242 06/04/18 0227  WBC 11.0* 11.2*  RBC 3.18* 3.04*  HCT 29.0* 27.8*  PLT 276 264   Recent Labs    06/01/18 1245 06/02/18 0112  NA 138 139  K 4.3 4.1  CL  --  110  CO2  --  25  BUN  --  6*  CREATININE  --  0.73  GLUCOSE 129* 129*  CALCIUM  --  9.2   No results for input(s): LABPT, INR in the last 72 hours.  Neurologically intact Dg Femur Min 2 Views Left  Result Date: 06/03/2018 CLINICAL DATA:  Post surgery EXAM: LEFT FEMUR 2 VIEWS COMPARISON:  Intraoperative images of 06/01/2018 FINDINGS: LEFT hip prosthesis. Osseous demineralization. Lateral plate and multiple cerclage wires at the LEFT femur extending from the greater trochanter to the distal diaphysis. Three distal screws. Degenerative changes at the medial compartment of the LEFT knee. Reduced oblique fracture of the mid LEFT femoral diaphysis. No additional fracture or dislocation. IMPRESSION: Post ORIF of the LEFT femur. LEFT hip prosthesis without dislocation. Electronically Signed   By: Lavonia Dana M.D.   On: 06/03/2018 10:17    Assessment/Plan: 3 Days Post-Op Procedure(s) (LRB): OPEN REDUCTION INTERNAL FIXATION (ORIF)  PERIPROSTHETIC FEMUR FRACTURE (Left) Up with therapy, plan transfers to chair and W/C. She has a bedside commode. Plan will be TDWB transfers until 6 wks then we will be able to increase WB and progress with walking. Home once able to safely transfer to recliner and W/C. Possibly Saturday depending on her progress.   Emma Lewis 06/04/2018, 7:33 AM

## 2018-06-04 NOTE — Progress Notes (Signed)
Spoke with patient to discuss PT recs of CIR vs SNF. She states that she was released from [facility] 10/18. She feels like that level of rehab attributed to her hip dislocation. She therefore, is not interested in going to CIR or SNF. She states she has all needed DME at home and her spouse to help care for her. She states she is open with [facility].

## 2018-06-04 NOTE — Progress Notes (Signed)
Physical Therapy Treatment Patient Details Name: Emma Lewis MRN: [id number] DOB: September 02, 1950 Today's Date: 06/04/2018    History of Present Illness Pt is a 67 y/o female admitted secondary to L hip dislocation and periprosthetic fracture. Pt is now s/p ORIF of L femur periprosthetic fracture. PMH includes L total hip revision.    PT Comments    Patient seen for mobility progression. Pt requires mod A for bed mobility with use of rails and max A +2 for functional transfers. Pt stood X2 from EOB but unable to progress to transferring into chair so bed moved and chair brought behind pt.  Continue to progress as tolerated.   Follow Up Recommendations  Supervision/Assistance - 24 hour;Other (comment)(CIR vs SNF pending progression )     Equipment Recommendations  None recommended by PT    Recommendations for Other Services OT consult     Precautions / Restrictions Precautions Precautions: Posterior Hip;Fall Precaution Booklet Issued: Yes (comment) Precaution Comments: Reviewed precautions with pt; Post hip precautions posted in room  Restrictions Weight Bearing Restrictions: Yes LLE Weight Bearing: Non weight bearing Other Position/Activity Restrictions: NWB order still active. per Dr. Lorin Mercy note 11/07 pt is TDWB LE    Mobility  Bed Mobility Overal bed mobility: Needs Assistance Bed Mobility: Supine to Sit     Supine to sit: Mod assist;HOB elevated;+2 for safety/equipment     General bed mobility comments: cues for sequencing; assist to bring L LE to EOB and use of rails   Transfers Overall transfer level: Needs assistance Equipment used: Rolling walker (2 wheeled) Transfers: Sit to/from Stand Sit to Stand: Max assist;+2 physical assistance;+2 safety/equipment;From elevated surface         General transfer comment: pt stood X2 from EOB and unable to pivot due to weight bearing so bed moved and chair moved behind pt; assist to power up into standing and maintain weight  bearing status; cues for hand placement and hip extension in standing; pt with flexed posture  Ambulation/Gait                 Stairs             Wheelchair Mobility    Modified Rankin (Stroke Patients Only)       Balance Overall balance assessment: Needs assistance Sitting-balance support: Bilateral upper extremity supported;Feet supported Sitting balance-Leahy Scale: Poor Sitting balance - Comments: Reliant on UE support to maintain sitting balance.                                     Cognition Arousal/Alertness: Awake/alert Behavior During Therapy: WFL for tasks assessed/performed Overall Cognitive Status: Within Functional Limits for tasks assessed                                        Exercises      General Comments        Pertinent Vitals/Pain Pain Assessment: Faces Faces Pain Scale: Hurts whole lot Pain Location: L hip  Pain Descriptors / Indicators: Aching;Grimacing;Guarding;Tightness;Sore Pain Intervention(s): Limited activity within patient's tolerance;Monitored during session;Repositioned    Home Living                      Prior Function            PT Goals (current goals can now  be found in the care plan section) Acute Rehab PT Goals Patient Stated Goal: to decrease pain  Progress towards PT goals: Progressing toward goals    Frequency    Min 5X/week      PT Plan Current plan remains appropriate    Co-evaluation              AM-PAC PT "6 Clicks" Daily Activity  Outcome Measure  Difficulty turning over in bed (including adjusting bedclothes, sheets and blankets)?: Unable Difficulty moving from lying on back to sitting on the side of the bed? : Unable Difficulty sitting down on and standing up from a chair with arms (e.g., wheelchair, bedside commode, etc,.)?: Unable Help needed moving to and from a bed to chair (including a wheelchair)?: Total Help needed walking in hospital  room?: Total Help needed climbing 3-5 steps with a railing? : Total 6 Click Score: 6    End of Session Equipment Utilized During Treatment: Gait belt Activity Tolerance: Patient limited by pain;Patient limited by fatigue Patient left: with call bell/phone within reach;in chair;with chair alarm set Nurse Communication: Mobility status PT Visit Diagnosis: Unsteadiness on feet (R26.81);Muscle weakness (generalized) (M62.81);Difficulty in walking, not elsewhere classified (R26.2);Pain Pain - Right/Left: Left Pain - part of body: Hip     Time: 4680-3212 PT Time Calculation (min) (ACUTE ONLY): 28 min  Charges:  $Therapeutic Activity: 23-37 mins                     Earney Navy, PTA Acute Rehabilitation Services Pager: [phone] Office: [phone]     Darliss Cheney 06/04/2018, 9:14 AM

## 2018-06-05 LAB — TYPE AND SCREEN
ABO/RH(D): A POS
Antibody Screen: NEGATIVE
Unit division: 0
Unit division: 0

## 2018-06-05 LAB — BPAM RBC
BLOOD PRODUCT EXPIRATION DATE: 201911302359
Blood Product Expiration Date: 201911302359
UNIT TYPE AND RH: 6200
UNIT TYPE AND RH: 6200

## 2018-06-05 MED ORDER — METHOCARBAMOL 500 MG PO TABS: 500.0000 mg | ORAL_TABLET | Freq: Four times a day (QID) | ORAL | 0 refills | Status: DC | PRN

## 2018-06-05 MED ORDER — OXYCODONE-ACETAMINOPHEN 7.5-325 MG PO TABS: 1.0000 | ORAL_TABLET | Freq: Four times a day (QID) | ORAL | 0 refills | Status: DC | PRN

## 2018-06-05 MED ORDER — ASPIRIN 325 MG PO TBEC: 325.0000 mg | DELAYED_RELEASE_TABLET | Freq: Every day | ORAL | 0 refills | Status: DC

## 2018-06-05 NOTE — Discharge Summary (Signed)
Patient ID: Emma Lewis MRN: [id number] DOB/AGE: 10-26-1950 67 y.o.  Admit date: 05/29/2018 Discharge date: 06/05/2018  Admission Diagnoses:  Active Problems:   Hip dislocation, left (HCC)   Peri-prosthetic femur fracture at tip of prosthesis   Discharge Diagnoses:  Active Problems:   Hip dislocation, left (HCC)   Peri-prosthetic femur fracture at tip of prosthesis  status post Procedure(s): OPEN REDUCTION INTERNAL FIXATION (ORIF) PERIPROSTHETIC FEMUR FRACTURE  Past Medical History:  Diagnosis Date  . Arthritis    "all over my body" (04/29/2018)  . Chronic lower back pain   . Fatty liver   . Fever blister    Takes Acyclovir  . GERD (gastroesophageal reflux disease)   . Hyperlipemia   . Hypothyroidism   . Migraines   . Osteopenia   . Pneumonia    "once; years ago" (04/29/2018)  . PONV (postoperative nausea and vomiting)   . Pre-diabetes   . Seasonal allergies   . Vertigo     Surgeries: Procedure(s): OPEN REDUCTION INTERNAL FIXATION (ORIF) PERIPROSTHETIC FEMUR FRACTURE on 06/01/2018   Consultants: Treatment Team:  [name], MD  Discharged Condition: Improved  Hospital Course: Emma Lewis is an 67 y.o. female who was admitted 05/29/2018 for operative treatment of periprosthetic femur fracture and hip dislocation.  Patient failed conservative treatments (please see the history and physical for the specifics) and had severe unremitting pain that affects sleep, daily activities and work/hobbies. After pre-op clearance, the patient was taken to the operating room on 06/01/2018 and underwent  Procedure(s): OPEN REDUCTION INTERNAL FIXATION (ORIF) PERIPROSTHETIC FEMUR FRACTURE.    Patient was given perioperative antibiotics:  Anti-infectives (From admission, onward)   Start     Dose/Rate Route Frequency Ordered Stop   06/01/18 2200  ceFAZolin (ANCEF) IVPB 1 g/50 mL premix     1 g 100 mL/hr over 30 Minutes Intravenous Every 8 hours 06/01/18 1640 06/02/18 0541    06/01/18 0930  ceFAZolin (ANCEF) IVPB 2g/100 mL premix     2 g 200 mL/hr over 30 Minutes Intravenous  Once 06/01/18 0925 06/01/18 1322   06/01/18 0930  ceFAZolin (ANCEF) 2-4 GM/100ML-% IVPB    Note to Pharmacy:  Henrine Screws   : cabinet override      06/01/18 0930 06/01/18 0956   05/29/18 2315  acyclovir (ZOVIRAX) tablet 400 mg     400 mg Oral 2 times daily 05/29/18 2154         Patient was given sequential compression devices and early ambulation to prevent DVT.   Patient benefited maximally from hospital stay and there were no complications. At the time of discharge, the patient was urinating/moving their bowels without difficulty, tolerating a regular diet, pain is controlled with oral pain medications and they have been cleared by PT/OT.   Recent vital signs:  Patient Vitals for the past 24 hrs:  BP Temp Temp src Pulse Resp SpO2  06/05/18 0516 (!) 99/59 98.2 F (36.8 C) Oral 87 16 96 %  06/04/18 2021 (!) 108/59 98.6 F (37 C) Oral 95 16 98 %  06/04/18 1821 (!) 104/53 98.6 F (37 C) Oral 93 16 97 %  06/04/18 1143 106/62 - - 91 18 97 %  06/04/18 1142 - 98.9 F (37.2 C) Axillary - - -  06/04/18 0933 101/63 99.6 F (37.6 C) Oral 96 20 99 %     Recent laboratory studies:  Recent Labs    06/03/18 0242 06/04/18 0227  WBC 11.0* 11.2*  HGB 9.0* 8.5*  HCT 29.0* 27.8*  PLT 276 264     Discharge Medications:   Allergies as of 06/05/2018      Reactions   Sulfa Antibiotics Other (See Comments)   UNSPECIFIED REACTION > Childhood allergy Mother said "I liked to have died"   Fenofibrate Rash   She didn't feel right      Medication List    STOP taking these medications   acetaminophen 325 MG tablet Commonly known as:  TYLENOL   diclofenac sodium 1 % Gel Commonly known as:  VOLTAREN     TAKE these medications   acyclovir 400 MG tablet Commonly known as:  ZOVIRAX Take 1 tablet (400 mg total) by mouth 2 (two) times daily. What changed:    when to take  this  reasons to take this   aspirin 325 MG EC tablet Take 1 tablet (325 mg total) by mouth daily with breakfast.   Biotin Plus Keratin 10000-100 MCG-MG Tabs Take 1 tablet by mouth 3 (three) times daily.   CALCIUM 600 + D PO Take 1 tablet by mouth 3 (three) times daily.   clobetasol cream 0.05 % Commonly known as:  TEMOVATE Apply 1 application topically 2 (two) times daily. What changed:    when to take this  reasons to take this   docusate sodium 100 MG capsule Commonly known as:  COLACE Take 1 capsule (100 mg total) by mouth 3 (three) times daily with meals.   ibuprofen 200 MG tablet Commonly known as:  ADVIL,MOTRIN Take 800 mg by mouth every 6 (six) hours as needed for headache or mild pain.   levothyroxine 100 MCG tablet Commonly known as:  SYNTHROID, LEVOTHROID Take 1 tablet (100 mcg total) by mouth daily before breakfast.   meclizine 25 MG tablet Commonly known as:  ANTIVERT Take 1 tablet (25 mg total) by mouth 3 (three) times daily as needed for dizziness. Vertigo. Also available OTC What changed:    when to take this  additional instructions   methocarbamol 500 MG tablet Commonly known as:  ROBAXIN Take 1 tablet (500 mg total) by mouth every 6 (six) hours as needed for muscle spasms. What changed:    medication strength  how much to take  when to take this  reasons to take this   nystatin-triamcinolone cream Commonly known as:  MYCOLOG II Apply 1 application topically 2 (two) times daily as needed (irritation).   OMEGA-3 FISH OIL PO Take 1 capsule by mouth 3 (three) times daily. 1,040mg  each   omeprazole 20 MG capsule Commonly known as:  PRILOSEC Take 10 mg by mouth daily.   oxyCODONE 5 MG immediate release tablet Commonly known as:  Oxy IR/ROXICODONE Take 1-2 tablets (5-10 mg total) by mouth every 6 (six) hours as needed for moderate pain (pain score 4-6).   oxyCODONE-acetaminophen 7.5-325 MG tablet Commonly known as:  PERCOCET Take  1-2 tablets by mouth every 6 (six) hours as needed for severe pain.   simvastatin 40 MG tablet Commonly known as:  ZOCOR Take 1 tablet (40 mg total) by mouth at bedtime.   THERATEARS 0.25 % Soln Generic drug:  Carboxymethylcellulose Sodium Place 1 drop into both eyes 2 (two) times daily as needed (dry eyes).   Vitamin D3 50 MCG (2000 UT) Tabs Take 2,000 Units by mouth 2 (two) times daily.       Diagnostic Studies: Dg Knee Complete 4 Views Left  Result Date: 05/07/2018 CLINICAL DATA:  Acute LEFT knee pain.  Recent LEFT hip replacement.  EXAM: LEFT KNEE - COMPLETE 4+ VIEW COMPARISON:  None. FINDINGS: No acute fracture, subluxation or dislocation. Mild joint space narrowing in the medial and patellofemoral compartments noted. No focal bony lesions noted. No joint effusion. IMPRESSION: Mild MEDIAL and patellofemoral compartment degenerative changes/joint space narrowing without other significant abnormality. Electronically Signed   By: Margarette Canada M.D.   On: 05/07/2018 12:53   Dg C-arm 1-60 Min  Result Date: 06/01/2018 CLINICAL DATA:  ORIF of the left femur EXAM: DG C-ARM 61-120 MIN; LEFT FEMUR 2 VIEWS FLUOROSCOPY TIME:  21 seconds COMPARISON:  Left femur radiographs-05/29/2018; left hip radiographs-05/29/2018; left knee radiographs-05/07/2018 FINDINGS: Six spot intraoperative fluoroscopic images of the left femur are provided for review. Images demonstrate the sequela of long segment sideplate fixation of periprosthetic femur fracture. The sideplate is transfixed with multiple cancellous screws as well as the application of additional cerclage wires. Alignment appears anatomic given coned field of view. Stable sequela of prior left total hip replacement. There is a minimal amount of expected subcutaneous emphysema scattered about the operative site. No radiopaque foreign body. IMPRESSION: Post sideplate fixation of periprosthetic femur fracture without evidence of complication as detailed above.  Electronically Signed   By: Sandi Mariscal M.D.   On: 06/01/2018 14:12   Dg C-arm 1-60 Min  Result Date: 06/01/2018 CLINICAL DATA:  ORIF of the left femur EXAM: DG C-ARM 61-120 MIN; LEFT FEMUR 2 VIEWS FLUOROSCOPY TIME:  21 seconds COMPARISON:  Left femur radiographs-05/29/2018; left hip radiographs-05/29/2018; left knee radiographs-05/07/2018 FINDINGS: Six spot intraoperative fluoroscopic images of the left femur are provided for review. Images demonstrate the sequela of long segment sideplate fixation of periprosthetic femur fracture. The sideplate is transfixed with multiple cancellous screws as well as the application of additional cerclage wires. Alignment appears anatomic given coned field of view. Stable sequela of prior left total hip replacement. There is a minimal amount of expected subcutaneous emphysema scattered about the operative site. No radiopaque foreign body. IMPRESSION: Post sideplate fixation of periprosthetic femur fracture without evidence of complication as detailed above. Electronically Signed   By: Sandi Mariscal M.D.   On: 06/01/2018 14:12   Dg Hip Port Unilat With Pelvis 1v Left  Result Date: 06/01/2018 CLINICAL DATA:  Left periprosthetic femur fracture ORIF. EXAM: DG HIP (WITH OR WITHOUT PELVIS) 1V PORT LEFT COMPARISON:  Left hip x-rays dated April 29, 2018. FINDINGS: Interval lateral sideplate and screw fixation of the left proximal periprosthetic femur fracture. Hardware is intact. Unchanged left total hip arthroplasty. Normal alignment. Postsurgical changes in the soft tissues of the left thigh. IMPRESSION: Status post left proximal periprosthetic femur fracture ORIF without acute postoperative complication. Electronically Signed   By: Titus Dubin M.D.   On: 06/01/2018 15:09   Dg Femur Min 2 Views Left  Result Date: 06/03/2018 CLINICAL DATA:  Post surgery EXAM: LEFT FEMUR 2 VIEWS COMPARISON:  Intraoperative images of 06/01/2018 FINDINGS: LEFT hip prosthesis. Osseous  demineralization. Lateral plate and multiple cerclage wires at the LEFT femur extending from the greater trochanter to the distal diaphysis. Three distal screws. Degenerative changes at the medial compartment of the LEFT knee. Reduced oblique fracture of the mid LEFT femoral diaphysis. No additional fracture or dislocation. IMPRESSION: Post ORIF of the LEFT femur. LEFT hip prosthesis without dislocation. Electronically Signed   By: Lavonia Dana M.D.   On: 06/03/2018 10:17   Dg Femur Min 2 Views Left  Result Date: 06/01/2018 CLINICAL DATA:  ORIF of the left femur EXAM: DG C-ARM 61-120 MIN; LEFT  FEMUR 2 VIEWS FLUOROSCOPY TIME:  21 seconds COMPARISON:  Left femur radiographs-05/29/2018; left hip radiographs-05/29/2018; left knee radiographs-05/07/2018 FINDINGS: Six spot intraoperative fluoroscopic images of the left femur are provided for review. Images demonstrate the sequela of long segment sideplate fixation of periprosthetic femur fracture. The sideplate is transfixed with multiple cancellous screws as well as the application of additional cerclage wires. Alignment appears anatomic given coned field of view. Stable sequela of prior left total hip replacement. There is a minimal amount of expected subcutaneous emphysema scattered about the operative site. No radiopaque foreign body. IMPRESSION: Post sideplate fixation of periprosthetic femur fracture without evidence of complication as detailed above. Electronically Signed   By: Sandi Mariscal M.D.   On: 06/01/2018 14:12   Xr Hip Unilat W Or W/o Pelvis 1v Left  Result Date: 05/29/2018 Left hip x-rays today show hip dislocation.  Component has subsided.  Patient also does have a periprosthetic femur fracture distal tip of the prosthesis.  I did ask Dr. Alphonzo Severance, Jean Rosenthal, Meridee Score to review x-rays.     Follow-up [facility] up.   Specialty:  Home Health Services Contact information: [address] [place] [address] [phone]        [name], MD. Schedule an appointment as soon as possible for a visit today.   Specialty:  Orthopedic Surgery Why:  NEED RETURN OFFICE VISIT 2 WEEKS POSTOP Contact information: [place] [address] [phone]           Discharge Plan:  discharge to SNF  Disposition:     Signed: Benjiman Core for Rodell Perna MD [facility] 06/05/2018, 9:21 AM

## 2018-06-05 NOTE — Discharge Instructions (Signed)
-  NONWEIGHTBEARING LEFT LOWER EXTREMITY.   -OK TO SHOWER BUT NO TUB SOAKING.

## 2018-06-05 NOTE — Progress Notes (Addendum)
Physical Therapy Treatment Patient Details Name: Emma Lewis MRN: [id number] DOB: 02/20/1951 Today's Date: 06/05/2018    History of Present Illness Pt is a 67 y/o female admitted secondary to L hip dislocation and periprosthetic fracture. Pt is now s/p ORIF of L femur periprosthetic fracture. PMH includes L total hip revision.    PT Comments    Patient seen for mobility progression.  Pt is making gradual progress toward mobility goals. This session focused on functional transfers for progression of w/c level mobility. Pt requires cues to maintain precautions throughout and mod A-mod A +2 for bed mobility and sit to stand/stand pivot transfers to [facility] and recliner. Continue to progress as tolerated.      Follow Up Recommendations  SNF     Equipment Recommendations  None recommended by PT    Recommendations for Other Services OT consult     Precautions / Restrictions Precautions Precautions: Posterior Hip;Fall Precaution Comments: Reviewed precautions with pt; Post hip precautions posted in room  Restrictions Weight Bearing Restrictions: Yes LLE Weight Bearing: Touchdown weight bearing    Mobility  Bed Mobility Overal bed mobility: Needs Assistance Bed Mobility: Supine to Sit     Supine to sit: Mod assist     General bed mobility comments: cues for sequencing; no use of rails; assist to bring L LE to EOB and to lower from bed  Transfers Overall transfer level: Needs assistance Equipment used: Rolling walker (2 wheeled) Transfers: Sit to/from [facility] Sit to Stand: Mod assist;From elevated surface;+2 safety/equipment Stand pivot transfers: Mod assist       General transfer comment: assist to power up into standing and for balance and guiding RW when pivoting EOB to BSC and BSC to recliner; cues for positioning and safe hand placement  Ambulation/Gait                 Stairs             Wheelchair Mobility    Modified Rankin  (Stroke Patients Only)       Balance Overall balance assessment: Needs assistance Sitting-balance support: Bilateral upper extremity supported;Feet supported Sitting balance-Leahy Scale: Poor     Standing balance support: Bilateral upper extremity supported;During functional activity Standing balance-Leahy Scale: Poor                              Cognition Arousal/Alertness: Awake/alert Behavior During Therapy: WFL for tasks assessed/performed Overall Cognitive Status: Within Functional Limits for tasks assessed                                        Exercises      General Comments General comments (skin integrity, edema, etc.): assistance needed for pericare       Pertinent Vitals/Pain Pain Assessment: Faces Faces Pain Scale: Hurts even more Pain Location: L LE  Pain Descriptors / Indicators: Aching;Grimacing;Guarding;Sore Pain Intervention(s): Limited activity within patient's tolerance;Monitored during session;Repositioned;Patient requesting pain meds-RN notified    Home Living                      Prior Function            PT Goals (current goals can now be found in the care plan section) Progress towards PT goals: Progressing toward goals    Frequency    Min 5X/week  PT Plan Current plan remains appropriate    Co-evaluation              AM-PAC PT "6 Clicks" Daily Activity  Outcome Measure  Difficulty turning over in bed (including adjusting bedclothes, sheets and blankets)?: Unable Difficulty moving from lying on back to sitting on the side of the bed? : Unable Difficulty sitting down on and standing up from a chair with arms (e.g., wheelchair, bedside commode, etc,.)?: Unable Help needed moving to and from a bed to chair (including a wheelchair)?: A Lot Help needed walking in hospital room?: Total Help needed climbing 3-5 steps with a railing? : Total 6 Click Score: 7    End of Session Equipment  Utilized During Treatment: Gait belt Activity Tolerance: Patient tolerated treatment well Patient left: with call bell/phone within reach;in chair Nurse Communication: Mobility status PT Visit Diagnosis: Unsteadiness on feet (R26.81);Muscle weakness (generalized) (M62.81);Difficulty in walking, not elsewhere classified (R26.2);Pain Pain - Right/Left: Left Pain - part of body: Hip     Time: 7505-1833 PT Time Calculation (min) (ACUTE ONLY): 35 min  Charges:  $Therapeutic Activity: 23-37 mins                     Earney Navy, PTA Acute Rehabilitation Services Pager: [phone] Office: [phone]     Darliss Cheney 06/05/2018, 2:56 PM

## 2018-06-05 NOTE — Progress Notes (Signed)
Subjective: Doing ok.  Patient refusing CIR or SNF placement.  Adamant about wanting to discharge home.     Objective: Vital signs in last 24 hours: Temp:  [98.2 F (36.8 C)-98.9 F (37.2 C)] 98.2 F (36.8 C) (11/08 0516) Pulse Rate:  [87-95] 87 (11/08 0516) Resp:  [16-18] 16 (11/08 0516) BP: (99-108)/(53-62) 99/59 (11/08 0516) SpO2:  [96 %-98 %] 96 % (11/08 0516)  Intake/Output from previous day: 11/07 0701 - 11/08 0700 In: 720 [P.O.:720] Out: 500 [Urine:500] Intake/Output this shift: No intake/output data recorded.  Recent Labs    06/03/18 0242 06/04/18 0227  HGB 9.0* 8.5*   Recent Labs    06/03/18 0242 06/04/18 0227  WBC 11.0* 11.2*  RBC 3.18* 3.04*  HCT 29.0* 27.8*  PLT 276 264   No results for input(s): NA, K, CL, CO2, BUN, CREATININE, GLUCOSE, CALCIUM in the last 72 hours. No results for input(s): LABPT, INR in the last 72 hours.  Exam: Alert and oriented.  NAD.  Wound looks good.  Staples intact.  No signs of infection.  Some serous drainage.        Assessment/Plan: Patient seen with Dr Lorin Mercy.  Plan will be to discharge home per their discussion.  Continue present care.     Benjiman Core 06/05/2018, 9:44 AM

## 2018-06-06 NOTE — Progress Notes (Addendum)
Tx performed and charted by SPTA under direct guidance and supervision of PTA at all times. This session was performed under the supervision of a licensed clinician.   Charting reviewed for accuracy and depicts tx performed and services provided.  Will inform supervising PT of change in recommendations.  Governor Rooks, PTA Acute Rehabilitation Services Pager [phone] Office [phone]   Physical Therapy Treatment Patient Details Name: Emma Lewis MRN: [id number] DOB: 1951/07/07 Today's Date: 06/06/2018    History of Present Illness Pt is a 67 y/o female admitted secondary to L hip dislocation and periprosthetic fracture. Pt is now s/p ORIF of L femur periprosthetic fracture. PMH includes L total hip revision.    PT Comments    Patient's tolerance to treatment today was fair.  Patient was in bed on phone upon PT arrival.  Treatment focused on safe transfers from bed to bedside commode and recliner chair while observing hip precautions.  Patient required min guard during all transfers to ensure safety.  Patient continues to be functionally limited due to pain and soreness of L LE but is progressing well.  Patient was educated and given home exercise program at end of treatment.  Patient would continue to benefit from acute care PT in order to address balance and mobility deficits based on current functional status.  Will inform supervising PT of change in recommendations.  Patient is now appropriate for HHPT based on progress.   Follow Up Recommendations  Home health PT;Supervision/Assistance - 24 hour     Equipment Recommendations  None recommended by PT    Recommendations for Other Services OT consult     Precautions / Restrictions Precautions Precautions: Posterior Hip;Fall Precaution Comments: Reviewed precautions with pt; Post hip precautions posted in room  Restrictions Weight Bearing Restrictions: Yes LLE Weight Bearing: Touchdown weight bearing Other Position/Activity  Restrictions: NWB order still active. per Dr. Lorin Mercy note 11/07 pt is TDWB LE    Mobility  Bed Mobility Overal bed mobility: Needs Assistance Bed Mobility: Supine to Sit     Supine to sit: Supervision     General bed mobility comments: Pt was able to guide B LE to EOB without assistance and no use of rails.  Therapist encouraged patient to complete all bed mobility simulating home environment.  Transfers Overall transfer level: Needs assistance Equipment used: Rolling walker (2 wheeled) Transfers: Sit to/from [facility] Sit to Stand: Min guard Stand pivot transfers: Min guard       General transfer comment: Pt demonstrated good hand and feet placement during sit to stand.  Pt observed hip precautions during sit to stand and kept L LE extended when ascending from elevated bed surface to RW.  Patient completed standing pivot transfers to bedside commode and recliner chair requiring increased time and effort due to soreness.  Ambulation/Gait Ambulation/Gait assistance: Min guard Gait Distance (Feet): (steps to pivot for transfer only) Assistive device: Rolling walker (2 wheeled) Gait Pattern/deviations: Antalgic;Step-to pattern;Decreased step length - left;Decreased step length - right Gait velocity: decreased   General Gait Details: pt able to take steps from bed to bedside commode and recliner chair requiring increased time.  Pt maintains weight bearing well.     Stairs             Wheelchair Mobility    Modified Rankin (Stroke Patients Only)       Balance Overall balance assessment: Needs assistance Sitting-balance support: Bilateral upper extremity supported;Feet supported Sitting balance-Leahy Scale: Poor Sitting balance - Comments: Reliant on UE  support to maintain sitting balance.    Standing balance support: Bilateral upper extremity supported;During functional activity Standing balance-Leahy Scale: Poor Standing balance comment: requires  B UE support from RW                            Cognition Arousal/Alertness: Awake/alert Behavior During Therapy: WFL for tasks assessed/performed Overall Cognitive Status: Within Functional Limits for tasks assessed Area of Impairment: Safety/judgement                               General Comments: Pt continues to be eager to go home.      Exercises Total Joint Exercises Knee Flexion: AROM;Left;10 reps;Seated General Exercises - Lower Extremity Long Arc Quad: AROM;Left;10 reps;Seated Other Exercises Other Exercises: educated on HEP     General Comments        Pertinent Vitals/Pain Pain Assessment: 0-10 Pain Score: 4 (pt reports 3/10 pain in bed and 4/10 when completing transfers) Pain Location: L LE Pain Descriptors / Indicators: Grimacing;Guarding;Sore Pain Intervention(s): Limited activity within patient's tolerance;Repositioned;Ice applied;Monitored during session;Patient requesting pain meds-RN notified    Home Living                      Prior Function            PT Goals (current goals can now be found in the care plan section) Acute Rehab PT Goals Patient Stated Goal: to go home PT Goal Formulation: With patient Time For Goal Achievement: 06/17/18 Potential to Achieve Goals: Fair Progress towards PT goals: Progressing toward goals    Frequency    Min 5X/week      PT Plan Discharge plan needs to be updated    Co-evaluation PT/OT/SLP Co-Evaluation/Treatment: Yes Reason for Co-Treatment: To address functional/ADL transfers PT goals addressed during session: Mobility/safety with mobility;Proper use of DME;Strengthening/ROM OT goals addressed during session: ADL's and self-care      AM-PAC PT "6 Clicks" Daily Activity  Outcome Measure  Difficulty turning over in bed (including adjusting bedclothes, sheets and blankets)?: A Lot Difficulty moving from lying on back to sitting on the side of the bed? : A  Little Difficulty sitting down on and standing up from a chair with arms (e.g., wheelchair, bedside commode, etc,.)?: A Little Help needed moving to and from a bed to chair (including a wheelchair)?: A Lot Help needed walking in hospital room?: A Lot Help needed climbing 3-5 steps with a railing? : Total 6 Click Score: 13    End of Session Equipment Utilized During Treatment: Gait belt Activity Tolerance: Patient tolerated treatment well Patient left: in chair;with call bell/phone within reach Nurse Communication: Mobility status PT Visit Diagnosis: Unsteadiness on feet (R26.81);Muscle weakness (generalized) (M62.81);Difficulty in walking, not elsewhere classified (R26.2);Pain Pain - Right/Left: Left Pain - part of body: Hip     Time: 3300-7622 PT Time Calculation (min) (ACUTE ONLY): 26 min  Charges:  $Therapeutic Activity: 8-22 mins                     [address] SPTA   Megan Branch 06/06/2018, 2:05 PM

## 2018-06-06 NOTE — Care Management (Addendum)
Pt was active with [facility] prior to admission.  Contacted Dr. Lorin Mercy for orders.  Per Dr. Lorin Mercy, pt will resume therapy after a few weeks which the office will take of. Pt does not need PTAR transport per RN.  No further CM needs at this time.

## 2018-06-06 NOTE — Progress Notes (Addendum)
   Subjective: 5 Days Post-Op Procedure(s) (LRB): OPEN REDUCTION INTERNAL FIXATION (ORIF) PERIPROSTHETIC FEMUR FRACTURE (Left) Patient reports pain as moderate.    Objective: Vital signs in last 24 hours: Temp:  [98.1 F (36.7 C)-98.5 F (36.9 C)] 98.5 F (36.9 C) (11/09 0545) Pulse Rate:  [81-91] 81 (11/09 0545) Resp:  [18] 18 (11/08 1516) BP: (96-99)/(50-57) 96/57 (11/09 0545) SpO2:  [95 %-96 %] 96 % (11/09 0545)  Intake/Output from previous day: 11/08 0701 - 11/09 0700 In: 480 [P.O.:480] Out: 700 [Urine:700] Intake/Output this shift: No intake/output data recorded.  Recent Labs    06/04/18 0227  HGB 8.5*   Recent Labs    06/04/18 0227  WBC 11.2*  RBC 3.04*  HCT 27.8*  PLT 264   No results for input(s): NA, K, CL, CO2, BUN, CREATININE, GLUCOSE, CALCIUM in the last 72 hours. No results for input(s): LABPT, INR in the last 72 hours.  Neurologically intact No results found.  Assessment/Plan: 5 Days Post-Op Procedure(s) (LRB): OPEN REDUCTION INTERNAL FIXATION (ORIF) PERIPROSTHETIC FEMUR FRACTURE (Left) Up with therapy, transfers to recliner, W/C ( she has at home with removable sidearm ) and potty chair.  She states she is ready to go today after therapy. She is able to transfer and will order HHPT in a few weeks once bone graft has time to progress.  Emma Lewis 06/06/2018, 8:41 AM

## 2018-06-06 NOTE — Plan of Care (Signed)
  Problem: Education: Goal: Knowledge of General Education information will improve Description Including pain rating scale, medication(s)/side effects and non-pharmacologic comfort measures Outcome: Adequate for Discharge   Problem: Health Behavior/Discharge Planning: Goal: Ability to manage health-related needs will improve Outcome: Adequate for Discharge   Problem: Clinical Measurements: Goal: Ability to maintain clinical measurements within normal limits will improve Outcome: Adequate for Discharge Goal: Will remain free from infection Outcome: Adequate for Discharge Goal: Diagnostic test results will improve Outcome: Adequate for Discharge Goal: Cardiovascular complication will be avoided Outcome: Adequate for Discharge   Problem: Activity: Goal: Risk for activity intolerance will decrease Outcome: Adequate for Discharge   Problem: Nutrition: Goal: Adequate nutrition will be maintained Outcome: Adequate for Discharge   Problem: Coping: Goal: Level of anxiety will decrease Outcome: Adequate for Discharge   Problem: Elimination: Goal: Will not experience complications related to bowel motility Outcome: Adequate for Discharge Goal: Will not experience complications related to urinary retention Outcome: Adequate for Discharge   Problem: Pain Managment: Goal: General experience of comfort will improve Outcome: Adequate for Discharge   Problem: Safety: Goal: Ability to remain free from injury will improve Outcome: Adequate for Discharge   Problem: Skin Integrity: Goal: Risk for impaired skin integrity will decrease Outcome: Adequate for Discharge   Problem: Education: Goal: Verbalization of understanding the information provided (i.e., activity precautions, restrictions, etc) will improve Outcome: Adequate for Discharge Goal: Individualized Educational Video(s) Outcome: Adequate for Discharge   Problem: Activity: Goal: Ability to ambulate and perform ADLs will  improve Outcome: Adequate for Discharge   Problem: Clinical Measurements: Goal: Postoperative complications will be avoided or minimized Outcome: Adequate for Discharge   Problem: Self-Concept: Goal: Ability to maintain and perform role responsibilities to the fullest extent possible will improve Outcome: Adequate for Discharge   Problem: Pain Management: Goal: Pain level will decrease Outcome: Adequate for Discharge

## 2018-06-06 NOTE — Progress Notes (Signed)
Occupational Therapy Treatment Patient Details Name: Emma Lewis MRN: [id number] DOB: Jul 28, 1951 Today's Date: 06/06/2018    History of present illness Pt is a 67 y/o female admitted secondary to L hip dislocation and periprosthetic fracture. Pt is now s/p ORIF of L femur periprosthetic fracture. PMH includes L total hip revision.   OT comments  Pt. Seen with PT for skilled therapy session. Focus of session per MD is transfers only.  Pt. Able to perform eob to bsc and also to recliner. Has w/c for home use.  Reviewed car transfer with recommendations to modify the positioning of the care seat to allow pt. To maintain posterior hip precautions during transfer into vehicle.  Pt. Verbalizes understanding of this.  demonstrated ability to guide caregiver on how to assist during transfers. Note d/c home later today.   Follow Up Recommendations  SNF;Supervision/Assistance - 24 hour    Equipment Recommendations       Recommendations for Other Services      Precautions / Restrictions Precautions Precautions: Posterior Hip;Fall Precaution Comments: Reviewed precautions with pt; Post hip precautions posted in room  Restrictions LLE Weight Bearing: Touchdown weight bearing Other Position/Activity Restrictions: NWB order still active. per Dr. Lorin Mercy note 11/07 pt is TDWB LE       Mobility Bed Mobility Overal bed mobility: Needs Assistance Bed Mobility: Supine to Sit     Supine to sit: Supervision     General bed mobility comments: cues for sequencing; no use of rails; able to guide b les to eob without assistance. reports she has bed that has options to raise hob if needed  Transfers Overall transfer level: Needs assistance Equipment used: Rolling walker (2 wheeled) Transfers: Sit to/from [facility] Sit to Stand: Min guard Stand pivot transfers: Min guard       General transfer comment: better control noted today with transtion from sit to stand    Balance                                            ADL either performed or assessed with clinical judgement   ADL Overall ADL's : Needs assistance/impaired                         Toilet Transfer: Min Dietitian Details (indicate cue type and reason): pt. able to perform eob to recliner while maintaining TDWB to R for bsc transfer Toileting- Clothing Manipulation and Hygiene: Total assistance;Sit to/from stand Toileting - Clothing Manipulation Details (indicate cue type and reason): reports husband will assist        General ADL Comments: improvement noted with transfers. pt. very eager and motivated to return home     Vision       Perception     Praxis      Cognition Arousal/Alertness: Awake/alert Behavior During Therapy: WFL for tasks assessed/performed Overall Cognitive Status: Within Functional Limits for tasks assessed                                          Exercises     Shoulder Instructions       General Comments      Pertinent Vitals/ Pain       Pain Assessment: Faces Faces Pain  Scale: Hurts whole lot Pain Location: L LE  Pain Descriptors / Indicators: Aching;Grimacing;Guarding;Sore Pain Intervention(s): Limited activity within patient's tolerance;Monitored during session;Repositioned;Patient requesting pain meds-RN notified;Ice applied  Home Living                                          Prior Functioning/Environment              Frequency  Min 2X/week        Progress Toward Goals  OT Goals(current goals can now be found in the care plan section)  Progress towards OT goals: Progressing toward goals  ADL Goals Pt Will Transfer to Toilet: with mod assist;stand pivot transfer;bedside commode Pt Will Perform Toileting - Clothing Manipulation and hygiene: with max assist;with 2+ total assist;sit to/from stand;with adaptive equipment  Plan       Co-evaluation    PT/OT/SLP Co-Evaluation/Treatment: Yes Reason for Co-Treatment: To address functional/ADL transfers   OT goals addressed during session: ADL's and self-care      AM-PAC PT "6 Clicks" Daily Activity     Outcome Measure   Help from another person eating meals?: None Help from another person taking care of personal grooming?: None Help from another person toileting, which includes using toliet, bedpan, or urinal?: A Lot Help from another person bathing (including washing, rinsing, drying)?: A Lot Help from another person to put on and taking off regular upper body clothing?: A Little Help from another person to put on and taking off regular lower body clothing?: A Lot 6 Click Score: 17    End of Session Equipment Utilized During Treatment: Gait belt;Rolling walker  OT Visit Diagnosis: Unsteadiness on feet (R26.81);Other abnormalities of gait and mobility (R26.89);Pain Pain - Right/Left: Left Pain - part of body: Hip   Activity Tolerance Patient tolerated treatment well   Patient Left in chair;with call bell/phone within reach   Nurse Communication Patient requests pain meds        Time: 5956-3875 OT Time Calculation (min): 16 min  Charges: OT General Charges $OT Visit: 1 Visit OT Treatments $Self Care/Home Management : 8-22 mins  Janice Coffin, COTA/L 06/06/2018, 9:24 AM

## 2018-06-16 ENCOUNTER — Other Ambulatory Visit: Payer: Self-pay

## 2018-06-17 ENCOUNTER — Encounter (INDEPENDENT_AMBULATORY_CARE_PROVIDER_SITE_OTHER): Payer: Self-pay | Admitting: Orthopaedic Surgery

## 2018-06-17 ENCOUNTER — Ambulatory Visit (INDEPENDENT_AMBULATORY_CARE_PROVIDER_SITE_OTHER): Payer: Medicare Other

## 2018-06-17 ENCOUNTER — Ambulatory Visit (INDEPENDENT_AMBULATORY_CARE_PROVIDER_SITE_OTHER): Payer: Medicare Other | Admitting: Orthopaedic Surgery

## 2018-06-17 VITALS — Ht 65.0 in | Wt 193.0 lb

## 2018-06-17 DIAGNOSIS — Z96649 Presence of unspecified artificial hip joint: Secondary | ICD-10-CM | POA: Diagnosis not present

## 2018-06-17 DIAGNOSIS — Z96642 Presence of left artificial hip joint: Secondary | ICD-10-CM

## 2018-06-17 DIAGNOSIS — M978XXA Periprosthetic fracture around other internal prosthetic joint, initial encounter: Secondary | ICD-10-CM | POA: Diagnosis not present

## 2018-06-17 MED ORDER — OXYCODONE-ACETAMINOPHEN 5-325 MG PO TABS: 1.0000 | ORAL_TABLET | Freq: Three times a day (TID) | ORAL | 0 refills | Status: DC | PRN

## 2018-06-17 MED ORDER — DOXYCYCLINE HYCLATE 100 MG PO CAPS: 100.0000 mg | ORAL_CAPSULE | Freq: Two times a day (BID) | ORAL | 0 refills | Status: DC

## 2018-06-17 NOTE — Addendum Note (Signed)
Addended by: Meyer Cory on: 06/17/2018 01:10 PM   Modules accepted: Orders

## 2018-06-17 NOTE — Addendum Note (Signed)
Addended byLaurann Montana on: 06/17/2018 01:18 PM   Modules accepted: Orders

## 2018-06-17 NOTE — Progress Notes (Signed)
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  04/29/2018 and 06/01/2018.  Patient has been playing a lot of ice.  Original dressings on she has not had any drainage to the dressing.  Some of serosanguineous drainage is noted on the dressing without foul odor.  She has some erythema midportion of the incision and it slightly warm.  Area was prepped and aspirated with a spinal needle obtaining 1 cc of serosanguineous fluid without purulence.  We will send this for culture.  No other cultures obtained we will start some antibiotics prophylactically and recheck her again in 1 week.  Temp 97.81F    Chief Complaint:  Chief Complaint  Patient presents with  . Left Leg - Follow-up    06/01/18 ORIF Periprosthetic Fracture Left Femur 04/29/18 Left Total Hip Revision-Posterior Approach    Visit Diagnoses:  1. History of left hip replacement   2. Periprosthetic fracture of femur at tip of prosthesis, initial encounter     Plan:  Culture obtained after prepping and use of a 22 spinal needle buried with only serosanguineous trace fluid obtained.  Culture sent.  We will place her on some doxycycline and recheck her in 1 week.  She will stop applying so much ice which may be causing some of the erythema.  Follow-Up Instructions: No follow-ups on file.   Orders:  Orders Placed This Encounter  Procedures  . XR FEMUR MIN 2 VIEWS LEFT   No orders of the defined types were placed in this encounter.   Imaging: Xr Femur Min 2 Views Left  Result Date: 06/17/2018 AP frog-leg leg lateral x-rays left hip obtained and reviewed.  This shows long revision stem long lateral plate multiple screws cable.  Hip remains reduced.  The osteotomy remains in satisfactory  position.  All cables are in position. Impression: Post-ORIF femur fracture post hip revision with cable plate system left hip.   PMFS History: Patient Active Problem List   Diagnosis Date Noted  . Hip dislocation, left ([place]) 05/29/2018  . Peri-prosthetic femur fracture at tip of prosthesis 05/29/2018  . Prediabetes   . Popping sound of knee joint   . Hypoalbuminemia due to protein-calorie malnutrition ([place])   . Transaminitis   . Leukocytosis   . Postoperative pain   . Failure of left total hip arthroplasty ([place]) 05/01/2018  . Acute blood loss anemia   . Leukemoid reaction   . 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 Joint effusion of pelvis or thigh, left 04/22/2018  . Trochanteric bursitis, left hip 04/22/2018  . History of left hip replacement 11/12/2017  . Bilateral primary osteoarthritis of knee 11/12/2017  . GERD (gastroesophageal reflux disease) 01/17/2017  . Hypothyroidism 01/17/2017  . Vertigo 01/16/2017  . Nausea and vomiting 01/16/2017  . Essential hypertension 01/16/2017  . Fatty liver 01/16/2017  . Failed total hip arthroplasty ([place]) 06/10/2016  . Pelvic mass 06/15/2015  . Closed wedge compression fracture of fifth lumbar vertebra ([place])   . Cervical spondylosis 02/09/2014  . DDD (degenerative disc disease), lumbar 05/13/2013  . OA (osteoarthritis) 05/13/2013   Past Medical History:  Diagnosis Date  . Arthritis    "all over my body" (04/29/2018)  .  Chronic lower back pain   . Fatty liver   . Fever blister    Takes Acyclovir  . GERD (gastroesophageal reflux disease)   . Hyperlipemia   . Hypothyroidism   . Migraines   . Osteopenia   . Pneumonia    "once; years ago" (04/29/2018)  . PONV (postoperative nausea and vomiting)   . Pre-diabetes   . Seasonal allergies   . Vertigo     Family History  Problem Relation Age of Onset  . Lymphoma Mother     Past Surgical History:  Procedure Laterality Date  . ANTERIOR CERVICAL DECOMP/DISCECTOMY FUSION N/A 02/09/2014    Procedure: ANTERIOR CERVICAL DECOMPRESSION/DISCECTOMY FUSION 2 LEVELS;  Surgeon: [name], MD;  Location: [place];  Service: Orthopedics;  Laterality: N/A;  C4-5, C5-6 Anterior Cervical Discectomy and Fusion, Allograft, Plate  . BACK SURGERY    . CARPAL TUNNEL RELEASE Right 2018  . CATARACT EXTRACTION W/ INTRAOCULAR LENS  IMPLANT, BILATERAL    . COLONOSCOPY    . FIXATION KYPHOPLASTY LUMBAR SPINE  X 2  . HIP ARTHROPLASTY Left 2011  . JOINT REPLACEMENT    . KNEE ARTHROSCOPY Right   . ORIF PERIPROSTHETIC FRACTURE Left 06/01/2018   Procedure: OPEN REDUCTION INTERNAL FIXATION (ORIF) PERIPROSTHETIC FEMUR FRACTURE;  Surgeon: [name], MD;  Location: [place];  Service: Orthopedics;  Laterality: Left;  . PATELLA FRACTURE SURGERY Right ~ 1990   "put metal in"  . PATELLA HARDWARE REMOVAL Right    "took the metal out"  . REVISION TOTAL HIP ARTHROPLASTY Left 04/29/2018  . ROBOTIC ASSISTED BILATERAL SALPINGO OOPHERECTOMY Bilateral 08/01/2015   Procedure: ROBOTIC ASSISTED BILATERAL SALPINGO OOPHORECTOMY;  Surgeon: Everitt Amber, MD;  Location: [facility];  Service: Gynecology;  Laterality: Bilateral;  . SHOULDER OPEN ROTATOR CUFF REPAIR Right    3 TOTAL  . SHOULDER SURGERY Right   . TOTAL HIP ARTHROPLASTY Right 01/21/2018   Procedure: RIGHT TOTAL HIP ARTHROPLASTY DIRECT ANTERIOR;  Surgeon: [name], MD;  Location: [place];  Service: Orthopedics;  Laterality: Right;  . TOTAL HIP REVISION Left 06/10/2016   Procedure: TOTAL HIP REVISION ARTHROPLASTY;  Surgeon: Rod Can, MD;  Location: [place];  Service: Orthopedics;  Laterality: Left;  . TOTAL HIP REVISION Left 04/29/2018   Procedure: left total hip arthroplasty revision posterior approach;  Surgeon: [name], MD;  Location: [place];  Service: Orthopedics;  Laterality: Left;  . WRIST GANGLION EXCISION Left 1970s   Social History   Occupational History  . Not on file  Tobacco Use  . Smoking status: Never Smoker  . Smokeless tobacco: Never Used    Substance and Sexual Activity  . Alcohol use: Yes    Comment: 04/29/2018 "maybe 2 drinks/year"  . Drug use: Never  . Sexual activity: Not Currently

## 2018-06-18 ENCOUNTER — Inpatient Hospital Stay (INDEPENDENT_AMBULATORY_CARE_PROVIDER_SITE_OTHER): Payer: Medicare Other | Admitting: Surgery

## 2018-06-19 ENCOUNTER — Inpatient Hospital Stay (HOSPITAL_COMMUNITY)
Admission: RE | Admit: 2018-06-19 | Discharge: 2018-06-22 | DRG: 464 | Disposition: A | Discharge status: Home Health | Payer: Medicare Other | Source: Ambulatory Visit | Attending: Orthopaedic Surgery | Admitting: Orthopaedic Surgery

## 2018-06-19 ENCOUNTER — Ambulatory Visit (INDEPENDENT_AMBULATORY_CARE_PROVIDER_SITE_OTHER): Payer: Medicare Other | Admitting: Orthopaedic Surgery

## 2018-06-19 ENCOUNTER — Encounter (HOSPITAL_COMMUNITY): Payer: Self-pay

## 2018-06-19 ENCOUNTER — Ambulatory Visit (HOSPITAL_COMMUNITY): Payer: Medicare Other | Admitting: Anesthesiology

## 2018-06-19 ENCOUNTER — Encounter (INDEPENDENT_AMBULATORY_CARE_PROVIDER_SITE_OTHER): Payer: Self-pay | Admitting: Orthopaedic Surgery

## 2018-06-19 ENCOUNTER — Encounter (HOSPITAL_COMMUNITY)
Admission: RE | Disposition: A | Discharge status: Home Health | Payer: Self-pay | Source: Ambulatory Visit | Attending: Orthopaedic Surgery

## 2018-06-19 VITALS — BP 112/69 | HR 91 | Temp 97.3°F | Ht 65.0 in | Wt 193.0 lb

## 2018-06-19 DIAGNOSIS — M8580 Other specified disorders of bone density and structure, unspecified site: Secondary | ICD-10-CM | POA: Diagnosis present

## 2018-06-19 DIAGNOSIS — Z96643 Presence of artificial hip joint, bilateral: Secondary | ICD-10-CM | POA: Diagnosis present

## 2018-06-19 DIAGNOSIS — E039 Hypothyroidism, unspecified: Secondary | ICD-10-CM | POA: Diagnosis present

## 2018-06-19 DIAGNOSIS — R7303 Prediabetes: Secondary | ICD-10-CM | POA: Diagnosis present

## 2018-06-19 DIAGNOSIS — G8929 Other chronic pain: Secondary | ICD-10-CM | POA: Diagnosis not present

## 2018-06-19 DIAGNOSIS — Z7982 Long term (current) use of aspirin: Secondary | ICD-10-CM

## 2018-06-19 DIAGNOSIS — S7002XS Contusion of left hip, sequela: Secondary | ICD-10-CM | POA: Diagnosis not present

## 2018-06-19 DIAGNOSIS — M96840 Postprocedural hematoma of a musculoskeletal structure following a musculoskeletal system procedure: Secondary | ICD-10-CM | POA: Diagnosis present

## 2018-06-19 DIAGNOSIS — M159 Polyosteoarthritis, unspecified: Secondary | ICD-10-CM | POA: Diagnosis not present

## 2018-06-19 DIAGNOSIS — K760 Fatty (change of) liver, not elsewhere classified: Secondary | ICD-10-CM | POA: Diagnosis not present

## 2018-06-19 DIAGNOSIS — Z981 Arthrodesis status: Secondary | ICD-10-CM

## 2018-06-19 DIAGNOSIS — K219 Gastro-esophageal reflux disease without esophagitis: Secondary | ICD-10-CM | POA: Diagnosis present

## 2018-06-19 DIAGNOSIS — M545 Low back pain: Secondary | ICD-10-CM | POA: Diagnosis not present

## 2018-06-19 DIAGNOSIS — E785 Hyperlipidemia, unspecified: Secondary | ICD-10-CM | POA: Diagnosis present

## 2018-06-19 DIAGNOSIS — T8452XA Infection and inflammatory reaction due to internal left hip prosthesis, initial encounter: Secondary | ICD-10-CM | POA: Diagnosis not present

## 2018-06-19 DIAGNOSIS — Z807 Family history of other malignant neoplasms of lymphoid, hematopoietic and related tissues: Secondary | ICD-10-CM

## 2018-06-19 DIAGNOSIS — M978XXD Periprosthetic fracture around other internal prosthetic joint, subsequent encounter: Secondary | ICD-10-CM

## 2018-06-19 DIAGNOSIS — Z9841 Cataract extraction status, right eye: Secondary | ICD-10-CM

## 2018-06-19 DIAGNOSIS — Z888 Allergy status to other drugs, medicaments and biological substances status: Secondary | ICD-10-CM

## 2018-06-19 DIAGNOSIS — Z882 Allergy status to sulfonamides status: Secondary | ICD-10-CM

## 2018-06-19 DIAGNOSIS — Z961 Presence of intraocular lens: Secondary | ICD-10-CM | POA: Diagnosis present

## 2018-06-19 DIAGNOSIS — B964 Proteus (mirabilis) (morganii) as the cause of diseases classified elsewhere: Secondary | ICD-10-CM | POA: Diagnosis present

## 2018-06-19 DIAGNOSIS — A498 Other bacterial infections of unspecified site: Secondary | ICD-10-CM

## 2018-06-19 DIAGNOSIS — Y831 Surgical operation with implant of artificial internal device as the cause of abnormal reaction of the patient, or of later complication, without mention of misadventure at the time of the procedure: Secondary | ICD-10-CM | POA: Diagnosis present

## 2018-06-19 DIAGNOSIS — Z96649 Presence of unspecified artificial hip joint: Secondary | ICD-10-CM

## 2018-06-19 DIAGNOSIS — I10 Essential (primary) hypertension: Secondary | ICD-10-CM | POA: Diagnosis not present

## 2018-06-19 DIAGNOSIS — S7002XA Contusion of left hip, initial encounter: Secondary | ICD-10-CM | POA: Diagnosis present

## 2018-06-19 DIAGNOSIS — Z9842 Cataract extraction status, left eye: Secondary | ICD-10-CM

## 2018-06-19 HISTORY — PX: INCISION AND DRAINAGE HIP: SHX1801

## 2018-06-19 LAB — PROTIME-INR
INR: 1.19
PROTHROMBIN TIME: 15 s (ref 11.4–15.2)

## 2018-06-19 LAB — COMPREHENSIVE METABOLIC PANEL
ALBUMIN: 2.9 g/dL — AB (ref 3.5–5.0)
ALT: 34 U/L (ref 0–44)
ANION GAP: 9 (ref 5–15)
AST: 49 U/L — ABNORMAL HIGH (ref 15–41)
Alkaline Phosphatase: 318 U/L — ABNORMAL HIGH (ref 38–126)
BUN: 13 mg/dL (ref 8–23)
CO2: 23 mmol/L (ref 22–32)
Calcium: 9.6 mg/dL (ref 8.9–10.3)
Chloride: 107 mmol/L (ref 98–111)
Creatinine, Ser: 0.73 mg/dL (ref 0.44–1.00)
GFR calc Af Amer: >60 mL/min (ref >60)
GFR calc non Af Amer: >60 mL/min (ref >60)
GLUCOSE: 84 mg/dL (ref 70–99)
POTASSIUM: 3.7 mmol/L (ref 3.5–5.1)
Sodium: 139 mmol/L (ref 135–145)
Total Bilirubin: 0.4 mg/dL (ref 0.3–1.2)
Total Protein: 7.4 g/dL (ref 6.5–8.1)

## 2018-06-19 LAB — APTT: aPTT: 39 seconds — ABNORMAL HIGH (ref 24–36)

## 2018-06-19 LAB — SEDIMENTATION RATE: SED RATE: 122 mm/h — AB (ref 0–22)

## 2018-06-19 LAB — CBC WITH DIFFERENTIAL/PLATELET
ABS IMMATURE GRANULOCYTES: 0.04 10*3/uL (ref 0.00–0.07)
BASOS ABS: 0.1 10*3/uL (ref 0.0–0.1)
Basophils Relative: 0 %
Eosinophils Absolute: 0.3 10*3/uL (ref 0.0–0.5)
Eosinophils Relative: 3 %
HCT: 31.7 % — ABNORMAL LOW (ref 36.0–46.0)
HEMOGLOBIN: 8.9 g/dL — AB (ref 12.0–15.0)
Immature Granulocytes: 0 %
LYMPHS PCT: 29 %
Lymphs Abs: 3.3 10*3/uL (ref 0.7–4.0)
MCH: 25.9 pg — ABNORMAL LOW (ref 26.0–34.0)
MCHC: 28.1 g/dL — ABNORMAL LOW (ref 30.0–36.0)
MCV: 92.4 fL (ref 80.0–100.0)
MONO ABS: 0.7 10*3/uL (ref 0.1–1.0)
Monocytes Relative: 6 %
NEUTROS ABS: 7.1 10*3/uL (ref 1.7–7.7)
NRBC: 0 % (ref 0.0–0.2)
Neutrophils Relative %: 62 %
Platelets: 679 10*3/uL — ABNORMAL HIGH (ref 150–400)
RBC: 3.43 MIL/uL — AB (ref 3.87–5.11)
RDW: 15.9 % — ABNORMAL HIGH (ref 11.5–15.5)
WBC: 11.6 10*3/uL — AB (ref 4.0–10.5)

## 2018-06-19 LAB — TYPE AND SCREEN
ABO/RH(D): A POS
Antibody Screen: NEGATIVE

## 2018-06-19 SURGERY — IRRIGATION AND DEBRIDEMENT HIP
Anesthesia: General | Site: Hip | Laterality: Left

## 2018-06-19 MED ORDER — OXYCODONE HCL 5 MG PO TABS: 10.0000 mg | ORAL_TABLET | ORAL | Status: DC | PRN

## 2018-06-19 MED ORDER — POLYVINYL ALCOHOL 1.4 % OP SOLN
1.0000 [drp] | Freq: Two times a day (BID) | OPHTHALMIC | Status: DC | PRN
  Filled 2018-06-19: qty 15

## 2018-06-19 MED ORDER — ACETAMINOPHEN 160 MG/5ML PO SOLN: 1000.0000 mg | Freq: Once | ORAL | Status: DC | PRN

## 2018-06-19 MED ORDER — FENTANYL CITRATE (PF) 100 MCG/2ML IJ SOLN
50.0000 ug | Freq: Once | INTRAMUSCULAR | Status: AC
  Administered 2018-06-19: 50 ug via INTRAVENOUS
  Filled 2018-06-19: qty 1

## 2018-06-19 MED ORDER — SUGAMMADEX SODIUM 200 MG/2ML IV SOLN
INTRAVENOUS | Status: DC | PRN
  Administered 2018-06-19: 200 mg via INTRAVENOUS

## 2018-06-19 MED ORDER — HYDROMORPHONE HCL 1 MG/ML IJ SOLN
INTRAMUSCULAR | Status: AC
Start: 2018-06-19 — End: 2018-06-20
  Filled 2018-06-19: qty 1

## 2018-06-19 MED ORDER — ACETAMINOPHEN 500 MG PO TABS
1000.0000 mg | ORAL_TABLET | Freq: Four times a day (QID) | ORAL | Status: AC
  Administered 2018-06-20 (×3): 1000 mg via ORAL
  Filled 2018-06-19 (×3): qty 2

## 2018-06-19 MED ORDER — FENTANYL CITRATE (PF) 100 MCG/2ML IJ SOLN
INTRAMUSCULAR | Status: AC
Start: 2018-06-19 — End: 2018-06-20
  Filled 2018-06-19: qty 2

## 2018-06-19 MED ORDER — PHENOL 1.4 % MT LIQD: 1.0000 | OROMUCOSAL | Status: DC | PRN

## 2018-06-19 MED ORDER — LACTATED RINGERS IV SOLN
INTRAVENOUS | Status: DC
  Administered 2018-06-19: 17:00:00 via INTRAVENOUS

## 2018-06-19 MED ORDER — MENTHOL 3 MG MT LOZG: 1.0000 | LOZENGE | OROMUCOSAL | Status: DC | PRN

## 2018-06-19 MED ORDER — ONDANSETRON HCL 4 MG/2ML IJ SOLN
INTRAMUSCULAR | Status: DC | PRN
  Administered 2018-06-19: 4 mg via INTRAVENOUS

## 2018-06-19 MED ORDER — OXYCODONE-ACETAMINOPHEN 5-325 MG PO TABS: 1.0000 | ORAL_TABLET | Freq: Three times a day (TID) | ORAL | Status: DC | PRN

## 2018-06-19 MED ORDER — SUGAMMADEX SODIUM 200 MG/2ML IV SOLN
INTRAVENOUS | Status: AC
  Filled 2018-06-19: qty 2

## 2018-06-19 MED ORDER — ACETAMINOPHEN 500 MG PO TABS: 1000.0000 mg | ORAL_TABLET | Freq: Once | ORAL | Status: DC | PRN

## 2018-06-19 MED ORDER — OXYCODONE HCL ER 10 MG PO T12A
10.0000 mg | EXTENDED_RELEASE_TABLET | Freq: Two times a day (BID) | ORAL | Status: DC
  Administered 2018-06-20 – 2018-06-22 (×5): 10 mg via ORAL
  Filled 2018-06-19 (×5): qty 1

## 2018-06-19 MED ORDER — ROCURONIUM BROMIDE 10 MG/ML (PF) SYRINGE
PREFILLED_SYRINGE | INTRAVENOUS | Status: DC | PRN
  Administered 2018-06-19: 50 mg via INTRAVENOUS

## 2018-06-19 MED ORDER — LEVOTHYROXINE SODIUM 100 MCG PO TABS
100.0000 ug | ORAL_TABLET | Freq: Every day | ORAL | Status: DC
  Administered 2018-06-20 – 2018-06-22 (×3): 100 ug via ORAL
  Filled 2018-06-19 (×3): qty 1

## 2018-06-19 MED ORDER — FENTANYL CITRATE (PF) 250 MCG/5ML IJ SOLN
INTRAMUSCULAR | Status: AC
  Filled 2018-06-19: qty 5

## 2018-06-19 MED ORDER — MIDAZOLAM HCL 2 MG/2ML IJ SOLN
INTRAMUSCULAR | Status: AC
  Filled 2018-06-19: qty 2

## 2018-06-19 MED ORDER — OXYCODONE HCL 5 MG PO TABS
5.0000 mg | ORAL_TABLET | Freq: Once | ORAL | Status: AC | PRN
  Administered 2018-06-19: 5 mg via ORAL

## 2018-06-19 MED ORDER — ACYCLOVIR 400 MG PO TABS
400.0000 mg | ORAL_TABLET | Freq: Every day | ORAL | Status: DC
  Administered 2018-06-20 – 2018-06-22 (×3): 400 mg via ORAL
  Filled 2018-06-19 (×3): qty 1

## 2018-06-19 MED ORDER — SIMVASTATIN 40 MG PO TABS
40.0000 mg | ORAL_TABLET | Freq: Every day | ORAL | Status: DC
  Administered 2018-06-20 – 2018-06-21 (×3): 40 mg via ORAL
  Filled 2018-06-19 (×3): qty 1

## 2018-06-19 MED ORDER — OMEGA-3-ACID ETHYL ESTERS 1 G PO CAPS
1.0000 | ORAL_CAPSULE | Freq: Three times a day (TID) | ORAL | Status: DC
  Administered 2018-06-20 – 2018-06-22 (×7): 1 g via ORAL
  Filled 2018-06-19 (×7): qty 1

## 2018-06-19 MED ORDER — PROPOFOL 10 MG/ML IV BOLUS
INTRAVENOUS | Status: DC | PRN
  Administered 2018-06-19: 140 mg via INTRAVENOUS

## 2018-06-19 MED ORDER — ONDANSETRON HCL 4 MG PO TABS: 4.0000 mg | ORAL_TABLET | Freq: Four times a day (QID) | ORAL | Status: DC | PRN

## 2018-06-19 MED ORDER — MECLIZINE HCL 25 MG PO TABS
25.0000 mg | ORAL_TABLET | Freq: Every day | ORAL | Status: DC | PRN
  Filled 2018-06-19: qty 1

## 2018-06-19 MED ORDER — FENTANYL CITRATE (PF) 100 MCG/2ML IJ SOLN
25.0000 ug | INTRAMUSCULAR | Status: DC | PRN
  Administered 2018-06-19 (×3): 50 ug via INTRAVENOUS

## 2018-06-19 MED ORDER — FERROUS SULFATE 325 (65 FE) MG PO TABS
325.0000 mg | ORAL_TABLET | Freq: Three times a day (TID) | ORAL | Status: DC
  Administered 2018-06-20 – 2018-06-22 (×8): 325 mg via ORAL
  Filled 2018-06-19 (×8): qty 1

## 2018-06-19 MED ORDER — TOBRAMYCIN SULFATE 1.2 G IJ SOLR
INTRAMUSCULAR | Status: DC | PRN
  Administered 2018-06-19: 1.2 g

## 2018-06-19 MED ORDER — FENTANYL CITRATE (PF) 100 MCG/2ML IJ SOLN
INTRAMUSCULAR | Status: AC
  Administered 2018-06-19: 50 ug via INTRAVENOUS
  Filled 2018-06-19: qty 2

## 2018-06-19 MED ORDER — ENSURE ENLIVE PO LIQD
237.0000 mL | Freq: Two times a day (BID) | ORAL | Status: DC
  Administered 2018-06-20 – 2018-06-22 (×4): 237 mL via ORAL

## 2018-06-19 MED ORDER — ONDANSETRON HCL 4 MG/2ML IJ SOLN: 4.0000 mg | Freq: Four times a day (QID) | INTRAMUSCULAR | Status: DC | PRN

## 2018-06-19 MED ORDER — TOBRAMYCIN SULFATE 1.2 G IJ SOLR
INTRAMUSCULAR | Status: AC
  Filled 2018-06-19: qty 1.2

## 2018-06-19 MED ORDER — VANCOMYCIN HCL 1000 MG IV SOLR
INTRAVENOUS | Status: AC
  Filled 2018-06-19: qty 1000

## 2018-06-19 MED ORDER — ONDANSETRON HCL 4 MG/2ML IJ SOLN
INTRAMUSCULAR | Status: AC
  Filled 2018-06-19: qty 2

## 2018-06-19 MED ORDER — VANCOMYCIN HCL 1000 MG IV SOLR
INTRAVENOUS | Status: DC | PRN
  Administered 2018-06-19: 1000 mg

## 2018-06-19 MED ORDER — DEXAMETHASONE SODIUM PHOSPHATE 10 MG/ML IJ SOLN
INTRAMUSCULAR | Status: DC | PRN
  Administered 2018-06-19: 5 mg via INTRAVENOUS

## 2018-06-19 MED ORDER — DOCUSATE SODIUM 100 MG PO CAPS
100.0000 mg | ORAL_CAPSULE | Freq: Two times a day (BID) | ORAL | Status: DC
  Administered 2018-06-20 – 2018-06-22 (×5): 100 mg via ORAL
  Filled 2018-06-19 (×5): qty 1

## 2018-06-19 MED ORDER — METOCLOPRAMIDE HCL 5 MG/ML IJ SOLN: 5.0000 mg | Freq: Three times a day (TID) | INTRAMUSCULAR | Status: DC | PRN

## 2018-06-19 MED ORDER — ASPIRIN EC 325 MG PO TBEC
325.0000 mg | DELAYED_RELEASE_TABLET | Freq: Every day | ORAL | Status: DC
  Administered 2018-06-20 – 2018-06-22 (×3): 325 mg via ORAL
  Filled 2018-06-19 (×3): qty 1

## 2018-06-19 MED ORDER — CEFAZOLIN SODIUM-DEXTROSE 2-3 GM-%(50ML) IV SOLR
INTRAVENOUS | Status: DC | PRN
  Administered 2018-06-19: 2 g via INTRAVENOUS

## 2018-06-19 MED ORDER — METHOCARBAMOL 500 MG PO TABS
500.0000 mg | ORAL_TABLET | Freq: Four times a day (QID) | ORAL | Status: DC | PRN
  Administered 2018-06-19 – 2018-06-20 (×2): 500 mg via ORAL
  Filled 2018-06-19: qty 1

## 2018-06-19 MED ORDER — TRAMADOL HCL 50 MG PO TABS
50.0000 mg | ORAL_TABLET | Freq: Four times a day (QID) | ORAL | Status: DC
  Administered 2018-06-20 – 2018-06-22 (×9): 50 mg via ORAL
  Filled 2018-06-19 (×9): qty 1

## 2018-06-19 MED ORDER — OXYCODONE HCL 5 MG PO TABS
ORAL_TABLET | ORAL | Status: AC
Start: 2018-06-19 — End: 2018-06-20
  Filled 2018-06-19: qty 1

## 2018-06-19 MED ORDER — DIPHENHYDRAMINE HCL 12.5 MG/5ML PO ELIX: 12.5000 mg | ORAL_SOLUTION | ORAL | Status: DC | PRN

## 2018-06-19 MED ORDER — HYDROMORPHONE HCL 1 MG/ML IJ SOLN: 0.5000 mg | INTRAMUSCULAR | Status: DC | PRN

## 2018-06-19 MED ORDER — ASPIRIN EC 325 MG PO TBEC: 325.0000 mg | DELAYED_RELEASE_TABLET | Freq: Every day | ORAL | Status: DC

## 2018-06-19 MED ORDER — ACETAMINOPHEN 10 MG/ML IV SOLN: 1000.0000 mg | Freq: Once | INTRAVENOUS | Status: DC | PRN

## 2018-06-19 MED ORDER — CALCIUM CARBONATE-VITAMIN D 500-200 MG-UNIT PO TABS
1.0000 | ORAL_TABLET | Freq: Two times a day (BID) | ORAL | Status: DC
  Administered 2018-06-20 – 2018-06-22 (×5): 1 via ORAL
  Filled 2018-06-19 (×5): qty 1

## 2018-06-19 MED ORDER — OXYCODONE HCL 5 MG/5ML PO SOLN: 5.0000 mg | Freq: Once | ORAL | Status: AC | PRN

## 2018-06-19 MED ORDER — CEFAZOLIN SODIUM-DEXTROSE 2-4 GM/100ML-% IV SOLN
2.0000 g | Freq: Four times a day (QID) | INTRAVENOUS | Status: AC
  Administered 2018-06-20 (×2): 2 g via INTRAVENOUS
  Filled 2018-06-19 (×2): qty 100

## 2018-06-19 MED ORDER — NYSTATIN-TRIAMCINOLONE 100000-0.1 UNIT/GM-% EX CREA
1.0000 "application " | TOPICAL_CREAM | Freq: Two times a day (BID) | CUTANEOUS | Status: DC | PRN
  Filled 2018-06-19: qty 15

## 2018-06-19 MED ORDER — OXYCODONE HCL 5 MG PO TABS
5.0000 mg | ORAL_TABLET | ORAL | Status: DC | PRN
  Administered 2018-06-20: 10 mg via ORAL
  Filled 2018-06-19: qty 2

## 2018-06-19 MED ORDER — LIDOCAINE 2% (20 MG/ML) 5 ML SYRINGE
INTRAMUSCULAR | Status: DC | PRN
  Administered 2018-06-19: 40 mg via INTRAVENOUS

## 2018-06-19 MED ORDER — SODIUM CHLORIDE 0.9 % IR SOLN
Status: DC | PRN
  Administered 2018-06-19: 1000 mL

## 2018-06-19 MED ORDER — BIOTIN PLUS KERATIN 10000-100 MCG-MG PO TABS: 1.0000 | ORAL_TABLET | Freq: Three times a day (TID) | ORAL | Status: DC

## 2018-06-19 MED ORDER — HYDROMORPHONE HCL 1 MG/ML IJ SOLN
0.5000 mg | INTRAMUSCULAR | Status: DC | PRN
  Administered 2018-06-19 (×2): 0.5 mg via INTRAVENOUS

## 2018-06-19 MED ORDER — FENTANYL CITRATE (PF) 250 MCG/5ML IJ SOLN
INTRAMUSCULAR | Status: DC | PRN
  Administered 2018-06-19: 100 ug via INTRAVENOUS
  Administered 2018-06-19 (×3): 50 ug via INTRAVENOUS

## 2018-06-19 MED ORDER — METHOCARBAMOL 500 MG PO TABS
ORAL_TABLET | ORAL | Status: AC
  Filled 2018-06-19: qty 1

## 2018-06-19 MED ORDER — ACETAMINOPHEN 325 MG PO TABS: 325.0000 mg | ORAL_TABLET | Freq: Four times a day (QID) | ORAL | Status: DC | PRN

## 2018-06-19 MED ORDER — METOCLOPRAMIDE HCL 5 MG PO TABS: 5.0000 mg | ORAL_TABLET | Freq: Three times a day (TID) | ORAL | Status: DC | PRN

## 2018-06-19 SURGICAL SUPPLY — 57 items
APL SKNCLS STERI-STRIP NONHPOA (GAUZE/BANDAGES/DRESSINGS) ×3
BENZOIN TINCTURE PRP APPL 2/3 (GAUZE/BANDAGES/DRESSINGS) ×6 IMPLANT
BOWL SMART MIX CTS (DISPOSABLE) IMPLANT
CANISTER WOUNDNEG PRESSURE 500 (CANNISTER) ×2 IMPLANT
CLOSURE WOUND 1/2 X4 (GAUZE/BANDAGES/DRESSINGS) ×1
COVER SURGICAL LIGHT HANDLE (MISCELLANEOUS) ×3 IMPLANT
COVER WAND RF STERILE (DRAPES) ×3 IMPLANT
DRAPE IMP U-DRAPE 54X76 (DRAPES) ×3 IMPLANT
DRAPE ORTHO SPLIT 77X108 STRL (DRAPES) ×6
DRAPE SURG ORHT 6 SPLT 77X108 (DRAPES) ×2 IMPLANT
DRAPE U-SHAPE 47X51 STRL (DRAPES) ×3 IMPLANT
DRESSING PEEL AND PLAC PRVNA20 (GAUZE/BANDAGES/DRESSINGS) IMPLANT
DRSG PAD ABDOMINAL 8X10 ST (GAUZE/BANDAGES/DRESSINGS) ×1 IMPLANT
DRSG PEEL AND PLACE PREVENA 20 (GAUZE/BANDAGES/DRESSINGS) ×3
DRSG VAC ATS SM SENSATRAC (GAUZE/BANDAGES/DRESSINGS) ×2 IMPLANT
DURAPREP 26ML APPLICATOR (WOUND CARE) ×5 IMPLANT
ELECT CAUTERY BLADE 6.4 (BLADE) IMPLANT
ELECT REM PT RETURN 9FT ADLT (ELECTROSURGICAL)
ELECTRODE REM PT RTRN 9FT ADLT (ELECTROSURGICAL) IMPLANT
GAUZE SPONGE 4X4 12PLY STRL (GAUZE/BANDAGES/DRESSINGS) ×3 IMPLANT
GAUZE XEROFORM 5X9 LF (GAUZE/BANDAGES/DRESSINGS) ×1 IMPLANT
GLOVE BIOGEL PI IND STRL 8 (GLOVE) ×2 IMPLANT
GLOVE BIOGEL PI INDICATOR 8 (GLOVE) ×4
GLOVE ORTHO TXT STRL SZ7.5 (GLOVE) ×6 IMPLANT
GOWN STRL REUS W/ TWL LRG LVL3 (GOWN DISPOSABLE) ×1 IMPLANT
GOWN STRL REUS W/ TWL XL LVL3 (GOWN DISPOSABLE) ×1 IMPLANT
GOWN STRL REUS W/TWL 2XL LVL3 (GOWN DISPOSABLE) ×3 IMPLANT
GOWN STRL REUS W/TWL LRG LVL3 (GOWN DISPOSABLE) ×6
GOWN STRL REUS W/TWL XL LVL3 (GOWN DISPOSABLE) ×3
HANDPIECE INTERPULSE COAX TIP (DISPOSABLE)
KIT BASIN OR (CUSTOM PROCEDURE TRAY) ×3 IMPLANT
KIT REMOVER STAPLE SKIN (MISCELLANEOUS) ×2 IMPLANT
KIT STIMULAN RAPID CURE  10CC (Orthopedic Implant) ×2 IMPLANT
KIT STIMULAN RAPID CURE 10CC (Orthopedic Implant) IMPLANT
KIT TURNOVER KIT B (KITS) ×3 IMPLANT
MANIFOLD NEPTUNE II (INSTRUMENTS) ×3 IMPLANT
NS IRRIG 1000ML POUR BTL (IV SOLUTION) ×3 IMPLANT
PACK TOTAL JOINT (CUSTOM PROCEDURE TRAY) ×3 IMPLANT
PACK UNIVERSAL I (CUSTOM PROCEDURE TRAY) ×3 IMPLANT
PAD ARMBOARD 7.5X6 YLW CONV (MISCELLANEOUS) ×6 IMPLANT
SET HNDPC FAN SPRY TIP SCT (DISPOSABLE) IMPLANT
SPONGE LAP 18X18 X RAY DECT (DISPOSABLE) ×3 IMPLANT
STAPLER VISISTAT 35W (STAPLE) ×3 IMPLANT
STRIP CLOSURE SKIN 1/2X4 (GAUZE/BANDAGES/DRESSINGS) ×1 IMPLANT
SUT ETHILON 2 0 PSLX (SUTURE) ×4 IMPLANT
SUT PDS AB 1 CT  36 (SUTURE)
SUT PDS AB 1 CT 36 (SUTURE) IMPLANT
SUT VIC AB 0 CT1 27 (SUTURE) ×6
SUT VIC AB 0 CT1 27XBRD ANBCTR (SUTURE) ×2 IMPLANT
SUT VIC AB 1 CTX 27 (SUTURE) ×4 IMPLANT
SUT VIC AB 2-0 CT1 27 (SUTURE) ×6
SUT VIC AB 2-0 CT1 TAPERPNT 27 (SUTURE) ×2 IMPLANT
SWAB CULTURE ESWAB REG 1ML (MISCELLANEOUS) IMPLANT
TOWEL OR 17X24 6PK STRL BLUE (TOWEL DISPOSABLE) ×3 IMPLANT
TOWEL OR 17X26 10 PK STRL BLUE (TOWEL DISPOSABLE) ×3 IMPLANT
UNDERPAD 30X30 (UNDERPADS AND DIAPERS) ×3 IMPLANT
WATER STERILE IRR 1000ML POUR (IV SOLUTION) ×3 IMPLANT

## 2018-06-19 NOTE — H&P (Signed)
Emma Lewis is an 67 y.o. female.   Chief Complaint: Left hip hematoma HPI: Patient came into office with redness left lateral hip and also drainage.  Status post left total hip revision April 29, 2018 and then had ORIF left femur for periprosthetic fracture June 01, 2018.  Office was contacted today stating that patient was having left hip drainage.   Past Medical History:  Diagnosis Date  . Arthritis    "all over my body" (04/29/2018)  . Chronic lower back pain   . Fatty liver   . Fever blister    Takes Acyclovir  . GERD (gastroesophageal reflux disease)   . Hyperlipemia   . Hypothyroidism   . Migraines   . Osteopenia   . Pneumonia    "once; years ago" (04/29/2018)  . PONV (postoperative nausea and vomiting)   . Pre-diabetes   . Seasonal allergies   . Vertigo     Past Surgical History:  Procedure Laterality Date  . ANTERIOR CERVICAL DECOMP/DISCECTOMY FUSION N/A 02/09/2014   Procedure: ANTERIOR CERVICAL DECOMPRESSION/DISCECTOMY FUSION 2 LEVELS;  Surgeon: [name], MD;  Location: [place];  Service: Orthopedics;  Laterality: N/A;  C4-5, C5-6 Anterior Cervical Discectomy and Fusion, Allograft, Plate  . BACK SURGERY    . CARPAL TUNNEL RELEASE Right 2018  . CATARACT EXTRACTION W/ INTRAOCULAR LENS  IMPLANT, BILATERAL    . COLONOSCOPY    . FIXATION KYPHOPLASTY LUMBAR SPINE  X 2  . HIP ARTHROPLASTY Left 2011  . JOINT REPLACEMENT    . KNEE ARTHROSCOPY Right   . ORIF PERIPROSTHETIC FRACTURE Left 06/01/2018   Procedure: OPEN REDUCTION INTERNAL FIXATION (ORIF) PERIPROSTHETIC FEMUR FRACTURE;  Surgeon: [name], MD;  Location: [place];  Service: Orthopedics;  Laterality: Left;  . PATELLA FRACTURE SURGERY Right ~ 1990   "put metal in"  . PATELLA HARDWARE REMOVAL Right    "took the metal out"  . REVISION TOTAL HIP ARTHROPLASTY Left 04/29/2018  . ROBOTIC ASSISTED BILATERAL SALPINGO OOPHERECTOMY Bilateral 08/01/2015   Procedure: ROBOTIC ASSISTED BILATERAL SALPINGO OOPHORECTOMY;   Surgeon: Everitt Amber, MD;  Location: [facility];  Service: Gynecology;  Laterality: Bilateral;  . SHOULDER OPEN ROTATOR CUFF REPAIR Right    3 TOTAL  . SHOULDER SURGERY Right   . TOTAL HIP ARTHROPLASTY Right 01/21/2018   Procedure: RIGHT TOTAL HIP ARTHROPLASTY DIRECT ANTERIOR;  Surgeon: [name], MD;  Location: [place];  Service: Orthopedics;  Laterality: Right;  . TOTAL HIP REVISION Left 06/10/2016   Procedure: TOTAL HIP REVISION ARTHROPLASTY;  Surgeon: Rod Can, MD;  Location: [place];  Service: Orthopedics;  Laterality: Left;  . TOTAL HIP REVISION Left 04/29/2018   Procedure: left total hip arthroplasty revision posterior approach;  Surgeon: [name], MD;  Location: [place];  Service: Orthopedics;  Laterality: Left;  . WRIST GANGLION EXCISION Left 1970s    Family History  Problem Relation Age of Onset  . Lymphoma Mother    Social History:  reports that she has never smoked. She has never used smokeless tobacco. She reports that she drinks alcohol. She reports that she does not use drugs.  Allergies:  Allergies  Allergen Reactions  . Sulfa Antibiotics Other (See Comments)    UNSPECIFIED REACTION > Childhood allergy Mother said "I liked to have died"  . Fenofibrate Rash    She didn't feel right    No medications prior to admission.    No results found for this or any previous visit (from the past 48 hour(s)).  No results found.  Review of Systems  Constitutional: Negative.   HENT: Negative.   Respiratory: Negative.   Cardiovascular: Negative.   Musculoskeletal: Positive for joint pain.  Neurological: Negative.   Psychiatric/Behavioral: Negative.     There were no vitals taken for this visit. Physical Exam  Constitutional: She is oriented to person, place, and time. No distress.  HENT:  Head: Normocephalic and atraumatic.  Eyes: Pupils are equal, round, and reactive to light. EOM are normal.  Cardiovascular: Normal heart sounds.  Respiratory: No respiratory  distress.  GI: She exhibits no distension.  Musculoskeletal:  Left hip tender to palpation.  Lateral hip erythematous.   no purulent drainage.  Neurological: She is alert and oriented to person, place, and time.  Skin: Skin is warm and dry.  Psychiatric: She has a normal mood and affect.     Assessment/Plan Left hip hematoma.  Status post ORIF left femur for periprosthetic fracture.  Dr. Lorin Mercy will take patient to the OR tonight for left hip evacuation of hematoma, I&D.  Procedure discussed with patient and her husband who was present.  All questions answered.  Benjiman Core, PA-C 06/19/2018, 2:16 PM

## 2018-06-19 NOTE — Interval H&P Note (Signed)
History and Physical Interval Note:  06/19/2018 7:53 PM  Emma Lewis  has presented today for surgery, with the diagnosis of hematoma  The various methods of treatment have been discussed with the patient and family. After consideration of risks, benefits and other options for treatment, the patient has consented to  Procedure(s): IRRIGATION AND DEBRIDEMENT HIP HEMATOMA , POSSIBLE ANTIBIOTIC BEADS, PREVENA VAC (Left) as a surgical intervention .  The patient's history has been reviewed, patient examined, no change in status, stable for surgery.  I have reviewed the patient's chart and labs.  Questions were answered to the patient's satisfaction.     [name]

## 2018-06-19 NOTE — Anesthesia Preprocedure Evaluation (Signed)
Anesthesia Evaluation  Patient identified by MRN, date of birth, ID band Patient awake    Reviewed: Allergy & Precautions, NPO status , Patient's Chart, lab work & pertinent test results  History of Anesthesia Complications (+) PONV and history of anesthetic complications  Airway Mallampati: III  TM Distance: <3 FB Neck ROM: Full    Dental no notable dental hx.    Pulmonary neg pulmonary ROS,    breath sounds clear to auscultation       Cardiovascular hypertension, Normal cardiovascular exam Rhythm:Regular Rate:Normal     Neuro/Psych  Headaches, negative psych ROS   GI/Hepatic Neg liver ROS, GERD  Medicated and Controlled,  Endo/Other  Hypothyroidism   Renal/GU negative Renal ROS     Musculoskeletal  (+) Arthritis ,   Abdominal   Peds  Hematology  (+) Blood dyscrasia, anemia ,   Anesthesia Other Findings   Reproductive/Obstetrics                             Anesthesia Physical Anesthesia Plan  ASA: II  Anesthesia Plan: General   Post-op Pain Management:    Induction: Intravenous  PONV Risk Score and Plan: 4 or greater and Ondansetron and Dexamethasone  Airway Management Planned: Oral ETT and LMA  Additional Equipment: None  Intra-op Plan:   Post-operative Plan: Extubation in OR  Informed Consent: I have reviewed the patients History and Physical, chart, labs and discussed the procedure including the risks, benefits and alternatives for the proposed anesthesia with the patient or authorized representative who has indicated his/her understanding and acceptance.   Dental advisory given  Plan Discussed with: CRNA and Surgeon  Anesthesia Plan Comments:         Anesthesia Quick Evaluation

## 2018-06-19 NOTE — Op Note (Signed)
Preop diagnosis: Draining hematoma post left hip revision and plating of femoral shaft fracture.  Postop diagnosis: Same  Procedure: Left hip exploration and evacuation of hematoma.  Cultures, as oral antibiotic beads reclosure with Praveena incisional VAC.  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  She underwent revision for severe metallosis with large amounts of metal debris embedded in the tissue on 04/29/2018 about 7 or 8 weeks ago.  At that time long trochanteric osteotomy extended had be performed in order to get the stem out using revision osteotomes.  Along 8 inch AML stem fully coated was placed with trochanteric cables.  Postoperatively while doing therapy she developed acute pain in the mid femur and x-rays showed fracture at the distal tip of the stem that extended up to the trochanter with rotation of the stem and subluxation of the hip.  She underwent revision surgery on 06/01/2018 with a large plate that went for the trochanter with screws multiple cables 9 or 10 distal screws and extended almost down to the knee.  She had been at home wound was healing she is having pain and presented to the office 3 days ago with slight drainage.  No purulence was noted she has some erythema anteriorly to the incision and hip was prepped aspirated with just some dark serosanguineous fluid obtained without purulence.  This was sent for cultures and no results are back.  She presented to the office today with copious increase drainage from her wound with some wetness at the skin edges no pus no foul odor was brought back to the hospital for evacuation due to the significant metallosis that she had in the past and the hip prosthesis that is present concern for potential for continued drainage and  developing an infection which could be significant catastrophic problem for her.  Procedure after induction of general anesthesia was placed placed lateral position axillary roll careful padding positioning Emma Lewis 7 frame.  Hip was prepped with DuraPrep the usual total hip sheets drapes were applied.  Timeout procedure was completed.  Midportion of the wound was opened removing the staples and after a timeout procedure and cultures were obtained.  There was serosanguineous fluid no purulence.  Proximal distal portions of the incision and staples removed in the midportion it was opened down deep and hematoma was evacuated.  Hip prosthesis was well visualized there was no necrotic tissue.  Some pieces of the bone graft that been placed near the sub-troches were noted close to the hip joint socket these were picked up and removed.  Copious irrigation was performed after aerobic and anaerobic cultures were obtained.  Some areas of remaining black metal stained tissue was removed.  Areas of tissue was cut back to better vascularity.  Plate of the trochanter was visualized there was no evidence of purulence in this area.  Repeat irrigation was performed and the #1 Vicryl was used for reapproximation of the fascia after some vancomycin and tobramycin bioabsorbable beads have been mixed and they were put down into the wound and some of the subcutaneous tissue some pushed distally and proximally in the subcu space to be on the safe side pending culture results.  A stat Gram stain was obtained and they have just called me stating that there were no WBCs and no organisms noted.  After tight closure of the tensor with interrupted figure-of-eight #1 Vicryl 2-0 nylon was used in the skin closure with vertical mattress and simple sutures.  A Praveena VAC was applied to the central portion of the long wound that extended from the posterior hip incision all the way down the knee and just the area that been open down deep and  copiously irrigated and evacuated.  Patient tolerated the procedure well.  At the time of inspection the acetabulum acetabulum was solid spindle stem was solid and trochanter was solid with trochanteric clamp.  Patient was transfer the care of room in stable condition.

## 2018-06-19 NOTE — Anesthesia Procedure Notes (Signed)
Procedure Name: Intubation Date/Time: 06/19/2018 8:42 PM Performed by: Verdie Drown, CRNA Pre-anesthesia Checklist: Patient identified, Emergency Drugs available, Suction available and Patient being monitored Patient Re-evaluated:Patient Re-evaluated prior to induction Oxygen Delivery Method: Circle System Utilized Preoxygenation: Pre-oxygenation with 100% oxygen Induction Type: IV induction Ventilation: Mask ventilation without difficulty Laryngoscope Size: Miller and 2 Grade View: Grade II Tube type: Oral Number of attempts: 1 Airway Equipment and Method: Stylet and Oral airway Placement Confirmation: ETT inserted through vocal cords under direct vision,  positive ETCO2 and breath sounds checked- equal and bilateral Secured at: 21 cm Tube secured with: Tape Dental Injury: Teeth and Oropharynx as per pre-operative assessment

## 2018-06-19 NOTE — Progress Notes (Signed)
Post-Op Visit Note   Patient: Emma Lewis           Date of Birth: 1950-11-08           MRN: [id number] Visit Date: 06/19/2018 PCP: Philmore Pali, NP   Assessment & Plan: Post hip revision with periprosthetic fracture with long plate multiple cables.  She was seen in the office couple days ago culture was obtained which is pending.  She is had increased drainage and return.  There is chocolate drainage coming out not foul-smelling.  Erythema over her thigh has decreased.  Where she is draining the skin is moist on the edges and staples are still intact.  She will need to return to the operating room to have the hematoma washed out possible VAC placement.  Multiple cultures.  It does not appear infected and she remains afebrile.  We will get cultures Intra-Op to be on the safe side and place a incisional VAC.  Procedure discussed patient agrees to proceed.  Chief Complaint:  Chief Complaint  Patient presents with  . Left Leg - Follow-up    06/01/18 ORIF Left Periprosthetic Femur Fracture   Visit Diagnoses:  1. Periprosthetic fracture of femur at tip of prosthesis, subsequent encounter     Plan: Irrigation and evacuation of draining postop left hip hematoma.  Follow-Up Instructions: No follow-ups on file.   Orders:  No orders of the defined types were placed in this encounter.  No orders of the defined types were placed in this encounter.   Imaging: No results found.  PMFS History: Patient Active Problem List   Diagnosis Date Noted  . Hip dislocation, left ([place]) 05/29/2018  . Peri-prosthetic femur fracture at tip of prosthesis 05/29/2018  . Prediabetes   . Popping sound of knee joint   . Hypoalbuminemia due to protein-calorie malnutrition ([place])   . Transaminitis   . Leukocytosis   . Postoperative pain   . Failure of left total hip arthroplasty ([place]) 05/01/2018  . Acute blood loss anemia   . Leukemoid reaction   . 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 Joint effusion of pelvis or  thigh, left 04/22/2018  . Trochanteric bursitis, left hip 04/22/2018  . History of left hip replacement 11/12/2017  . Bilateral primary osteoarthritis of knee 11/12/2017  . GERD (gastroesophageal reflux disease) 01/17/2017  . Hypothyroidism 01/17/2017  . Vertigo 01/16/2017  . Nausea and vomiting 01/16/2017  . Essential hypertension 01/16/2017  . Fatty liver 01/16/2017  . Failed total hip arthroplasty ([place]) 06/10/2016  . Pelvic mass 06/15/2015  . Closed wedge compression fracture of fifth lumbar vertebra ([place])   . Cervical spondylosis 02/09/2014  . DDD (degenerative disc disease), lumbar 05/13/2013  . OA (osteoarthritis) 05/13/2013   Past Medical History:  Diagnosis Date  . Arthritis    "all over my body" (04/29/2018)  . Chronic lower back pain   . Fatty liver   . Fever blister    Takes Acyclovir  . GERD (gastroesophageal reflux disease)   . Hyperlipemia   . Hypothyroidism   . Migraines   . Osteopenia   . Pneumonia    "once; years ago" (04/29/2018)  . PONV (postoperative nausea and vomiting)   . Pre-diabetes   . Seasonal allergies   . Vertigo     Family History  Problem Relation Age of Onset  . Lymphoma Mother     Past Surgical History:  Procedure Laterality Date  . ANTERIOR CERVICAL DECOMP/DISCECTOMY FUSION N/A 02/09/2014   Procedure: ANTERIOR CERVICAL DECOMPRESSION/DISCECTOMY FUSION  2 LEVELS;  Surgeon: [name], MD;  Location: [place];  Service: Orthopedics;  Laterality: N/A;  C4-5, C5-6 Anterior Cervical Discectomy and Fusion, Allograft, Plate  . BACK SURGERY    . CARPAL TUNNEL RELEASE Right 2018  . CATARACT EXTRACTION W/ INTRAOCULAR LENS  IMPLANT, BILATERAL    . COLONOSCOPY    . FIXATION KYPHOPLASTY LUMBAR SPINE  X 2  . HIP ARTHROPLASTY Left 2011  . JOINT REPLACEMENT    . KNEE ARTHROSCOPY Right   . ORIF PERIPROSTHETIC FRACTURE Left 06/01/2018   Procedure: OPEN REDUCTION INTERNAL FIXATION (ORIF) PERIPROSTHETIC FEMUR FRACTURE;  Surgeon: [name], MD;   Location: [place];  Service: Orthopedics;  Laterality: Left;  . PATELLA FRACTURE SURGERY Right ~ 1990   "put metal in"  . PATELLA HARDWARE REMOVAL Right    "took the metal out"  . REVISION TOTAL HIP ARTHROPLASTY Left 04/29/2018  . ROBOTIC ASSISTED BILATERAL SALPINGO OOPHERECTOMY Bilateral 08/01/2015   Procedure: ROBOTIC ASSISTED BILATERAL SALPINGO OOPHORECTOMY;  Surgeon: Everitt Amber, MD;  [facility];  Service: Gynecology;  Laterality: Bilateral;  . SHOULDER OPEN ROTATOR CUFF REPAIR Right    3 TOTAL  . SHOULDER SURGERY Right   . TOTAL HIP ARTHROPLASTY Right 01/21/2018   Procedure: RIGHT TOTAL HIP ARTHROPLASTY DIRECT ANTERIOR;  Surgeon: [name], MD;  Location: [place];  Service: Orthopedics;  Laterality: Right;  . TOTAL HIP REVISION Left 06/10/2016   Procedure: TOTAL HIP REVISION ARTHROPLASTY;  Surgeon: Rod Can, MD;  Location: [place];  Service: Orthopedics;  Laterality: Left;  . TOTAL HIP REVISION Left 04/29/2018   Procedure: left total hip arthroplasty revision posterior approach;  Surgeon: [name], MD;  Location: [place];  Service: Orthopedics;  Laterality: Left;  . WRIST GANGLION EXCISION Left 1970s   Social History   Occupational History  . Not on file  Tobacco Use  . Smoking status: Never Smoker  . Smokeless tobacco: Never Used  Substance and Sexual Activity  . Alcohol use: Yes    Comment: 04/29/2018 "maybe 2 drinks/year"  . Drug use: Never  . Sexual activity: Not Currently

## 2018-06-19 NOTE — Transfer of Care (Signed)
Immediate Anesthesia Transfer of Care Note  Patient: Emma Lewis  Procedure(s) Performed: IRRIGATION AND DEBRIDEMENT LEFT HIP HEMATOMA ANTIBIOTIC BEADS, APPLICATION ON WOUND VAC (Left Hip)  Patient Location: PACU  Anesthesia Type:General  Level of Consciousness: awake, alert , oriented and patient cooperative  Airway & Oxygen Therapy: Patient Spontanous Breathing and Patient connected to nasal cannula oxygen  Post-op Assessment: Report given to RN and Post -op Vital signs reviewed and stable  Post vital signs: Reviewed and stable  Last Vitals:  Vitals Value Taken Time  BP    Temp    Pulse 106 06/19/2018 10:17 PM  Resp 13 06/19/2018 10:17 PM  SpO2 93 % 06/19/2018 10:17 PM  Vitals shown include unvalidated device data.  Last Pain:  Vitals:   06/19/18 1636  TempSrc:   PainSc: 8          Complications: No apparent anesthesia complications

## 2018-06-20 ENCOUNTER — Other Ambulatory Visit: Payer: Self-pay

## 2018-06-20 LAB — C-REACTIVE PROTEIN: CRP: 9.5 mg/dL — ABNORMAL HIGH (ref <1.0)

## 2018-06-20 LAB — CBC
HEMATOCRIT: 25.7 % — AB (ref 36.0–46.0)
HEMOGLOBIN: 7.8 g/dL — AB (ref 12.0–15.0)
MCH: 26.8 pg (ref 26.0–34.0)
MCHC: 30.4 g/dL (ref 30.0–36.0)
MCV: 88.3 fL (ref 80.0–100.0)
NRBC: 0 % (ref 0.0–0.2)
Platelets: 433 10*3/uL — ABNORMAL HIGH (ref 150–400)
RBC: 2.91 MIL/uL — AB (ref 3.87–5.11)
RDW: 15.4 % (ref 11.5–15.5)
WBC: 7.7 10*3/uL (ref 4.0–10.5)

## 2018-06-20 NOTE — Progress Notes (Signed)
   Subjective: 1 Day Post-Op Procedure(s) (LRB): IRRIGATION AND DEBRIDEMENT LEFT HIP HEMATOMA ANTIBIOTIC BEADS, APPLICATION ON WOUND VAC (Left) Patient reports pain as moderate and severe.    Objective: Vital signs in last 24 hours: Temp:  [97.3 F (36.3 C)-98.3 F (36.8 C)] 97.8 F (36.6 C) (11/23 0837) Pulse Rate:  [90-102] 102 (11/23 0837) Resp:  [14-18] 18 (11/23 0837) BP: (97-127)/(47-83) 116/57 (11/23 0837) SpO2:  [91 %-98 %] 98 % (11/23 0837) Weight:  [87.5 kg-87.7 kg] 87.7 kg (11/23 0322)  Intake/Output from previous day: 11/22 0701 - 11/23 0700 In: 1477 [I.V.:1277; IV Piggyback:200] Out: 1625 [Urine:1600; Blood:25] Intake/Output this shift: Total I/O In: 120 [P.O.:120] Out: 950 [Urine:950]  Recent Labs    06/19/18 1557 06/20/18 0355  HGB 8.9* 7.8*   Recent Labs    06/19/18 1557 06/20/18 0355  WBC 11.6* 7.7  RBC 3.43* 2.91*  HCT 31.7* 25.7*  PLT 679* 433*   Recent Labs    06/19/18 1557  NA 139  K 3.7  CL 107  CO2 23  BUN 13  CREATININE 0.73  GLUCOSE 84  CALCIUM 9.6   Recent Labs    06/19/18 1557  INR 1.19    Neurologically intact No results found.  Assessment/Plan: 1 Day Post-Op Procedure(s) (LRB): IRRIGATION AND DEBRIDEMENT LEFT HIP HEMATOMA ANTIBIOTIC BEADS, APPLICATION ON WOUND VAC (Left) Up with therapy  50 % WB.     Gram stain neg.  Cultures pending.  Low albumin , protein depleted. PO encouraged.   Emma Lewis 06/20/2018, 10:38 AM

## 2018-06-20 NOTE — Evaluation (Signed)
Physical Therapy Evaluation Patient Details Name: Emma Lewis MRN: [id number] DOB: [birth year] Today's Date: 06/20/2018   History of Present Illness  Pt is a 67 y.o. female admitted due to L hip hematoma requiring surgical evacuation, antibiotic bead and vac placement. She previously underwent L THA revision 04/29/18. She fell at home sustaining L hip dislocation and periprosthetic fx 06/01/18 and underwent ORIF L femur.     Clinical Impression  Pt admitted with above diagnosis. Pt currently with functional limitations due to the deficits listed below (see PT Problem List). On eval pt required min guard assist bed mobility and transfers for safety and line management. Pt will benefit from skilled PT to increase their independence and safety with mobility to allow discharge to the venue listed below.  PT to follow acutely. Pt reports MD wants minimum activity due to bone graft L hip; therefore, recommending no follow up services. Pt has all needed DME.     Follow Up Recommendations No PT follow up;Supervision/Assistance - 24 hour    Equipment Recommendations  None recommended by PT    Recommendations for Other Services       Precautions / Restrictions Precautions Precautions: Posterior Hip;Fall Precaution Comments: reviewed posterior precautions  Restrictions LLE Weight Bearing: Partial weight bearing LLE Partial Weight Bearing Percentage or Pounds: 50%      Mobility  Bed Mobility Overal bed mobility: Needs Assistance Bed Mobility: Supine to Sit     Supine to sit: Min guard;HOB elevated     General bed mobility comments: cues for sequencing, min guard assist for safety and lines  Transfers Overall transfer level: Needs assistance Equipment used: Rolling walker (2 wheeled) Transfers: Sit to/from [facility] Sit to Stand: Min guard Stand pivot transfers: Min guard       General transfer comment: cues for hand placement, min guard for safety and  lines  Ambulation/Gait Ambulation/Gait assistance: Min guard Gait Distance (Feet): 2 Feet Assistive device: Rolling walker (2 wheeled) Gait Pattern/deviations: Antalgic;Step-to pattern;Decreased stride length     General Gait Details: pivot steps bed to recliner. Pt able to maintain PWB LLE.  Stairs            Wheelchair Mobility    Modified Rankin (Stroke Patients Only)       Balance   Sitting-balance support: Feet supported;No upper extremity supported Sitting balance-Leahy Scale: Good     Standing balance support: Bilateral upper extremity supported;During functional activity Standing balance-Leahy Scale: Poor Standing balance comment: requires B UE support from RW                             Pertinent Vitals/Pain Pain Assessment: 0-10 Pain Score: 4  Pain Location: L LE Pain Descriptors / Indicators: Shooting;Sharp;Grimacing Pain Intervention(s): Monitored during session;Limited activity within patient's tolerance;Repositioned;Premedicated before session    [place] expects to be discharged to:: Private residence Living Arrangements: Spouse/significant other Available Help at Discharge: Family;Available 24 hours/day Type of Home: House Home Access: Ramped entrance     Home Layout: One level Home Equipment: Walker - 2 wheels;Cane - single point;Bedside commode;Crutches;Shower seat - built in;Wheelchair - manual      Prior Function Level of Independence: Independent with assistive device(s)         Comments: wheelchair for primary mobility. Mod I transfers with RW. Pt sponge bathes.     Hand Dominance   Dominant Hand: Right    Extremity/Trunk Assessment   Upper Extremity Assessment  Upper Extremity Assessment: Overall WFL for tasks assessed    Lower Extremity Assessment LLE Deficits / Details: Deficits consistent with post op pain and weakness. Very limited ROM secondary to pain.     Cervical / Trunk  Assessment Cervical / Trunk Assessment: Kyphotic  Communication   Communication: No difficulties  Cognition Arousal/Alertness: Awake/alert Behavior During Therapy: WFL for tasks assessed/performed Overall Cognitive Status: Within Functional Limits for tasks assessed                                 General Comments: easily distracted      General Comments      Exercises General Exercises - Lower Extremity Quad Sets: AROM;Left;5 reps   Assessment/Plan    PT Assessment Patient needs continued PT services  PT Problem List Decreased strength;Decreased range of motion;Decreased activity tolerance;Decreased balance;Decreased mobility;Decreased knowledge of use of DME;Decreased knowledge of precautions;Pain       PT Treatment Interventions DME instruction;Therapeutic activities;Functional mobility training;Gait training;Therapeutic exercise;Balance training;Patient/family education    PT Goals (Current goals can be found in the Care Plan section)  Acute Rehab PT Goals Patient Stated Goal: to go home PT Goal Formulation: With patient Time For Goal Achievement: 07/04/18 Potential to Achieve Goals: Good    Frequency Min 3X/week   Barriers to discharge        Co-evaluation               AM-PAC PT "6 Clicks" Mobility  Outcome Measure Help needed turning from your back to your side while in a flat bed without using bedrails?: None Help needed moving from lying on your back to sitting on the side of a flat bed without using bedrails?: None Help needed moving to and from a bed to a chair (including a wheelchair)?: A Little Help needed standing up from a chair using your arms (e.g., wheelchair or bedside chair)?: A Little Help needed to walk in hospital room?: A Little Help needed climbing 3-5 steps with a railing? : A Lot 6 Click Score: 19    End of Session Equipment Utilized During Treatment: Gait belt Activity Tolerance: Patient tolerated treatment  well Patient left: in chair;with call bell/phone within reach Nurse Communication: Mobility status PT Visit Diagnosis: Unsteadiness on feet (R26.81);Muscle weakness (generalized) (M62.81);Difficulty in walking, not elsewhere classified (R26.2);Pain;Other abnormalities of gait and mobility (R26.89) Pain - Right/Left: Left Pain - part of body: Hip    Time: 1962-2297 PT Time Calculation (min) (ACUTE ONLY): 25 min   Charges:   PT Evaluation $PT Eval Moderate Complexity: 1 Mod PT Treatments $Therapeutic Activity: 8-22 mins        Lorrin Goodell, PT  Office # [phone] Pager [phone]   Lorriane Shire 06/20/2018, 12:05 PM

## 2018-06-20 NOTE — Plan of Care (Signed)

## 2018-06-21 ENCOUNTER — Inpatient Hospital Stay: Payer: Self-pay

## 2018-06-21 DIAGNOSIS — Z7982 Long term (current) use of aspirin: Secondary | ICD-10-CM | POA: Diagnosis not present

## 2018-06-21 DIAGNOSIS — S7002XS Contusion of left hip, sequela: Secondary | ICD-10-CM | POA: Diagnosis not present

## 2018-06-21 DIAGNOSIS — Z9842 Cataract extraction status, left eye: Secondary | ICD-10-CM | POA: Diagnosis not present

## 2018-06-21 DIAGNOSIS — Z888 Allergy status to other drugs, medicaments and biological substances status: Secondary | ICD-10-CM | POA: Diagnosis not present

## 2018-06-21 DIAGNOSIS — Z961 Presence of intraocular lens: Secondary | ICD-10-CM | POA: Diagnosis present

## 2018-06-21 DIAGNOSIS — A498 Other bacterial infections of unspecified site: Secondary | ICD-10-CM | POA: Diagnosis not present

## 2018-06-21 DIAGNOSIS — E039 Hypothyroidism, unspecified: Secondary | ICD-10-CM | POA: Diagnosis not present

## 2018-06-21 DIAGNOSIS — E785 Hyperlipidemia, unspecified: Secondary | ICD-10-CM | POA: Diagnosis not present

## 2018-06-21 DIAGNOSIS — M549 Dorsalgia, unspecified: Secondary | ICD-10-CM

## 2018-06-21 DIAGNOSIS — Z981 Arthrodesis status: Secondary | ICD-10-CM | POA: Diagnosis not present

## 2018-06-21 DIAGNOSIS — Z807 Family history of other malignant neoplasms of lymphoid, hematopoietic and related tissues: Secondary | ICD-10-CM | POA: Diagnosis not present

## 2018-06-21 DIAGNOSIS — B964 Proteus (mirabilis) (morganii) as the cause of diseases classified elsewhere: Secondary | ICD-10-CM

## 2018-06-21 DIAGNOSIS — M00852 Arthritis due to other bacteria, left hip: Secondary | ICD-10-CM

## 2018-06-21 DIAGNOSIS — T8452XA Infection and inflammatory reaction due to internal left hip prosthesis, initial encounter: Principal | ICD-10-CM

## 2018-06-21 DIAGNOSIS — Y831 Surgical operation with implant of artificial internal device as the cause of abnormal reaction of the patient, or of later complication, without mention of misadventure at the time of the procedure: Secondary | ICD-10-CM | POA: Diagnosis present

## 2018-06-21 DIAGNOSIS — Z96643 Presence of artificial hip joint, bilateral: Secondary | ICD-10-CM | POA: Diagnosis present

## 2018-06-21 DIAGNOSIS — Z882 Allergy status to sulfonamides status: Secondary | ICD-10-CM | POA: Diagnosis not present

## 2018-06-21 DIAGNOSIS — Z978 Presence of other specified devices: Secondary | ICD-10-CM

## 2018-06-21 DIAGNOSIS — G8929 Other chronic pain: Secondary | ICD-10-CM | POA: Diagnosis not present

## 2018-06-21 DIAGNOSIS — M8580 Other specified disorders of bone density and structure, unspecified site: Secondary | ICD-10-CM | POA: Diagnosis not present

## 2018-06-21 DIAGNOSIS — K760 Fatty (change of) liver, not elsewhere classified: Secondary | ICD-10-CM | POA: Diagnosis not present

## 2018-06-21 DIAGNOSIS — M159 Polyosteoarthritis, unspecified: Secondary | ICD-10-CM | POA: Diagnosis not present

## 2018-06-21 DIAGNOSIS — M545 Low back pain: Secondary | ICD-10-CM | POA: Diagnosis not present

## 2018-06-21 DIAGNOSIS — Z96642 Presence of left artificial hip joint: Secondary | ICD-10-CM

## 2018-06-21 DIAGNOSIS — K219 Gastro-esophageal reflux disease without esophagitis: Secondary | ICD-10-CM | POA: Diagnosis not present

## 2018-06-21 DIAGNOSIS — Z9841 Cataract extraction status, right eye: Secondary | ICD-10-CM | POA: Diagnosis not present

## 2018-06-21 DIAGNOSIS — R7303 Prediabetes: Secondary | ICD-10-CM | POA: Diagnosis not present

## 2018-06-21 DIAGNOSIS — M96840 Postprocedural hematoma of a musculoskeletal structure following a musculoskeletal system procedure: Secondary | ICD-10-CM | POA: Diagnosis not present

## 2018-06-21 LAB — CBC
HCT: 24.9 % — ABNORMAL LOW (ref 36.0–46.0)
Hemoglobin: 7.3 g/dL — ABNORMAL LOW (ref 12.0–15.0)
MCH: 26.3 pg (ref 26.0–34.0)
MCHC: 29.3 g/dL — ABNORMAL LOW (ref 30.0–36.0)
MCV: 89.6 fL (ref 80.0–100.0)
PLATELETS: 464 10*3/uL — AB (ref 150–400)
RBC: 2.78 MIL/uL — AB (ref 3.87–5.11)
RDW: 15.4 % (ref 11.5–15.5)
WBC: 9.8 10*3/uL (ref 4.0–10.5)
nRBC: 0 % (ref 0.0–0.2)

## 2018-06-21 MED ORDER — SODIUM CHLORIDE 0.9% FLUSH: 10.0000 mL | INTRAVENOUS | Status: DC | PRN

## 2018-06-21 MED ORDER — CEFTRIAXONE IV (FOR PTA / DISCHARGE USE ONLY): 2.0000 g | INTRAVENOUS | 0 refills | Status: DC

## 2018-06-21 MED ORDER — SODIUM CHLORIDE 0.9 % IV SOLN
2.0000 g | INTRAVENOUS | Status: DC
  Administered 2018-06-21: 2 g via INTRAVENOUS
  Filled 2018-06-21 (×2): qty 20

## 2018-06-21 NOTE — Progress Notes (Signed)
Peripherally Inserted Central Catheter/Midline Placement  The IV Nurse has discussed with the patient and/or persons authorized to consent for the patient, the purpose of this procedure and the potential benefits and risks involved with this procedure.  The benefits include less needle sticks, lab draws from the catheter, and the patient may be discharged home with the catheter. Risks include, but not limited to, infection, bleeding, blood clot (thrombus formation), and puncture of an artery; nerve damage and irregular heartbeat and possibility to perform a PICC exchange if needed/ordered by physician.  Alternatives to this procedure were also discussed.  Bard Power PICC patient education guide, fact sheet on infection prevention and patient information card has been provided to patient /or left at bedside.    PICC/Midline Placement Documentation  PICC Single Lumen 06/21/18 PICC Right Brachial 38 cm 0 cm (Active)  Indication for Insertion or Continuance of Line Prolonged intravenous therapies 06/21/2018  6:00 PM  Exposed Catheter (cm) 0 cm 06/21/2018  6:00 PM  Site Assessment Clean;Dry;Intact 06/21/2018  6:00 PM  Line Status Flushed;Blood return noted;Saline locked 06/21/2018  6:00 PM  Dressing Type Transparent 06/21/2018  6:00 PM  Dressing Status Clean;Dry;Intact;Antimicrobial disc in place 06/21/2018  6:00 PM  Line Care Connections checked and tightened 06/21/2018  6:00 PM  Dressing Change Due 06/28/18 06/21/2018  6:00 PM       Lorenza Cambridge 06/21/2018, 6:49 PM

## 2018-06-21 NOTE — Progress Notes (Signed)
PHARMACY CONSULT NOTE FOR:  OUTPATIENT  PARENTERAL ANTIBIOTIC THERAPY (OPAT)  Indication: Infected artificial hip  Regimen:  Ceftriaxone 2 g IV q24h End date: 07/31/18  IV antibiotic discharge orders are pended. To discharging provider:  please sign these orders via discharge navigator,  Select New Orders & click on the button choice - Manage This Unsigned Work.      Emma Lewis, [name] 06/21/2018, 5:51 PM

## 2018-06-21 NOTE — Consult Note (Addendum)
[facility] for Infectious Disease    Date of Admission:  06/19/2018   Total days of antibiotics: 0               Reason for Consult: Septic hip    Referring Provider: Lorin Mercy   Assessment: Infected L THR  Plan: 1. Would start her on ceftriaxone 2. Await sensi 3. Place PIC (pt aware) 4. Plan for long term anbx. 6 weeks IV.   5. outpt pharmacy order placed.   Thank you so much for this interesting consult,  Active Problems:   Hip hematoma, left   Hematoma of left hip   . acyclovir  400 mg Oral Daily  . aspirin EC  325 mg Oral Q breakfast  . calcium-vitamin D  1 tablet Oral BID  . docusate sodium  100 mg Oral BID  . feeding supplement (ENSURE ENLIVE)  237 mL Oral BID BM  . ferrous sulfate  325 mg Oral TID PC  . levothyroxine  100 mcg Oral QAC breakfast  . omega-3 acid ethyl esters  1 capsule Oral TID  . oxyCODONE  10 mg Oral Q12H  . simvastatin  40 mg Oral QHS  . traMADol  50 mg Oral Q6H    HPI: Emma Lewis is a 67 y.o. female with hx of L THR in 2011. By 2017 she developed noises coming from her hip. She had exchange of parts resulting in increased length and back pain and R hip pain.   By 12-2017 she underwent R THR but continue to have L hip pain. She was unable to bare wt and developed a swollen area. She had an injection and this improve.  She underwent a second injection for a recurrence and after the injection her wound began to drain. She was felt to have questionable metalosis and on 04-2018 she underwent resection of her L THR with wires/fusion. She then returned 11-1 with a peri-prosthetic fracture noted at f/u. She had ORIF on 11-4.  She returned to hospital on 11-22 with oozing from her wound and had I & D same day. Her Cx is now showing proteus. Sensi pending.   (Hx from pt)  Review of Systems: Review of Systems  Constitutional: Positive for malaise/fatigue. Negative for chills and fever.  Gastrointestinal: Negative for constipation and  diarrhea.  Genitourinary: Negative for dysuria.  Musculoskeletal: Positive for back pain and joint pain.  Please see HPI. All other systems reviewed and negative.   Past Medical History:  Diagnosis Date  . Arthritis    "all over my body" (04/29/2018)  . Chronic lower back pain   . Fatty liver   . Fever blister    Takes Acyclovir  . GERD (gastroesophageal reflux disease)   . Hyperlipemia   . Hypothyroidism   . Migraines   . Osteopenia   . Pneumonia    "once; years ago" (04/29/2018)  . PONV (postoperative nausea and vomiting)   . Pre-diabetes   . Seasonal allergies   . Vertigo     Social History   Tobacco Use  . Smoking status: Never Smoker  . Smokeless tobacco: Never Used  Substance Use Topics  . Alcohol use: Yes    Comment: 04/29/2018 "maybe 2 drinks/year"  . Drug use: Never  she is a retired Development worker, community carrier.   Family History  Problem Relation Age of Onset  . Lymphoma Mother    She has a childhood sulfa allergy which she is unclear about.  Medications:  Scheduled: . acyclovir  400 mg Oral Daily  . aspirin EC  325 mg Oral Q breakfast  . calcium-vitamin D  1 tablet Oral BID  . docusate sodium  100 mg Oral BID  . feeding supplement (ENSURE ENLIVE)  237 mL Oral BID BM  . ferrous sulfate  325 mg Oral TID PC  . levothyroxine  100 mcg Oral QAC breakfast  . omega-3 acid ethyl esters  1 capsule Oral TID  . oxyCODONE  10 mg Oral Q12H  . simvastatin  40 mg Oral QHS  . traMADol  50 mg Oral Q6H    Abtx:  Anti-infectives (From admission, onward)   Start     Dose/Rate Route Frequency Ordered Stop   06/20/18 1000  acyclovir (ZOVIRAX) tablet 400 mg    Note to Pharmacy:  Patient taking differently: Take one tablet (400 mg) by mouth daily, may increase to one tablet (400 mg) twice daily as needed for fever blisters.     400 mg Oral Daily 06/19/18 2348     06/20/18 0000  ceFAZolin (ANCEF) IVPB 2g/100 mL premix     2 g 200 mL/hr over 30 Minutes Intravenous Every 6 hours  06/19/18 2348 06/20/18 1122   06/19/18 2109  vancomycin (VANCOCIN) powder  Status:  Discontinued       As needed 06/19/18 2110 06/19/18 2213   06/19/18 2103  tobramycin (NEBCIN) powder  Status:  Discontinued       As needed 06/19/18 2109 06/19/18 2213        OBJECTIVE: Blood pressure (!) 105/52, pulse 81, temperature 98.3 F (36.8 C), temperature source Oral, resp. rate 17, height 5\' 5"  (1.651 m), weight 87.7 kg, SpO2 94 %.  Physical Exam  Constitutional: She is oriented to person, place, and time. She appears well-developed and well-nourished.  HENT:  Mouth/Throat: No oropharyngeal exudate.  Eyes: Pupils are equal, round, and reactive to light. EOM are normal.  Neck: Normal range of motion. Neck supple.  Cardiovascular: Normal rate, regular rhythm and normal heart sounds.  Pulmonary/Chest: Effort normal and breath sounds normal.  Abdominal: Soft. Bowel sounds are normal. She exhibits no distension. There is no tenderness.  Musculoskeletal:       Legs: Lymphadenopathy:    She has no cervical adenopathy.  Neurological: She is alert and oriented to person, place, and time.  Skin: Skin is warm and dry.  Psychiatric: She has a normal mood and affect.    Lab Results Results for orders placed or performed during the hospital encounter of 06/19/18 (from the past 48 hour(s))  Type and screen Order type and screen if day of surgery is less than 15 days from draw of preadmission visit or order morning of surgery if day of surgery is greater than 6 days from preadmission visit.     Status: None   Collection Time: 06/19/18  4:15 PM  Result Value Ref Range   ABO/RH(D) A POS    Antibody Screen NEG    Sample Expiration      06/22/2018 Performed at [facility], [place] [address] [place], [place] [address]   Aerobic/Anaerobic Culture (surgical/deep wound)     Status: None (Preliminary result)   Collection Time: 06/19/18  9:24 PM  Result Value Ref Range   Specimen Description WOUND  LEFT HIP    Special Requests      NONE Performed at [facility], [place] [address] [place], [place] [address]    Gram Stain  NO WBC SEEN NO ORGANISMS SEEN RESULT CALLED TO, READ BACK BY AND VERIFIED WITH: Olga Millers MD 06/19/18 2229 JDW    Culture      RARE PROTEUS MIRABILIS NO ANAEROBES ISOLATED; CULTURE IN PROGRESS FOR 5 DAYS    Report Status PENDING   C-reactive protein     Status: Abnormal   Collection Time: 06/20/18 12:07 AM  Result Value Ref Range   CRP 9.5 (H) <1.0 mg/dL    Comment: Performed at [facility], [address], [place], [place] [address]  CBC     Status: Abnormal   Collection Time: 06/20/18  3:55 AM  Result Value Ref Range   WBC 7.7 4.0 - 10.5 K/uL   RBC 2.91 (L) 3.87 - 5.11 MIL/uL   Hemoglobin 7.8 (L) 12.0 - 15.0 g/dL   HCT 25.7 (L) 36.0 - 46.0 %   MCV 88.3 80.0 - 100.0 fL   MCH 26.8 26.0 - 34.0 pg   MCHC 30.4 30.0 - 36.0 g/dL   RDW 15.4 11.5 - 15.5 %   Platelets 433 (H) 150 - 400 K/uL   nRBC 0.0 0.0 - 0.2 %    Comment: Performed at [facility], [place] [address], [place], [place] [address]  CBC     Status: Abnormal   Collection Time: 06/21/18  3:42 AM  Result Value Ref Range   WBC 9.8 4.0 - 10.5 K/uL   RBC 2.78 (L) 3.87 - 5.11 MIL/uL   Hemoglobin 7.3 (L) 12.0 - 15.0 g/dL   HCT 24.9 (L) 36.0 - 46.0 %   MCV 89.6 80.0 - 100.0 fL   MCH 26.3 26.0 - 34.0 pg   MCHC 29.3 (L) 30.0 - 36.0 g/dL   RDW 15.4 11.5 - 15.5 %   Platelets 464 (H) 150 - 400 K/uL   nRBC 0.0 0.0 - 0.2 %    Comment: Performed at [facility], [place] [address], [place], [place] [address]      Component Value Date/Time   SDES WOUND LEFT HIP 06/19/2018 2124   SPECREQUEST  06/19/2018 2124    NONE Performed at [facility] [address], [place], [place] [address]    CULT  06/19/2018 2124    RARE PROTEUS MIRABILIS NO ANAEROBES ISOLATED; CULTURE IN PROGRESS FOR 5 DAYS    REPTSTATUS PENDING 06/19/2018 2124   No results found. Recent Results (from the  past 240 hour(s))  Anaerobic and Aerobic Culture     Status: None (Preliminary result)   Collection Time: 06/17/18  1:18 PM  Result Value Ref Range Status   MICRO NUMBER: [id number]  Preliminary   SPECIMEN QUALITY: Adequate  Preliminary   Source: LEFT HIP WOUND  Preliminary   STATUS: PRELIMINARY  Preliminary   GRAM STAIN:   Preliminary    Few White blood cells seen No epithelial cells seen Few Gram positive cocci in pairs   ANA RESULT:   Preliminary    No anaerobes isolated to date, continuing incubation.   MICRO NUMBER: [id number]  Final   SPECIMEN QUALITY: Adequate  Final   SOURCE: LEFT HIP WOUND  Final   STATUS: FINAL  Final   AER RESULT:   Final    Growth of skin flora (note: Growth does not include S. aureus, beta-hemolytic Streptococci or P. aeruginosa).  Aerobic/Anaerobic Culture (surgical/deep wound)     Status: None (Preliminary result)   Collection Time: 06/19/18  9:24 PM  Result Value Ref Range Status  Specimen Description WOUND LEFT HIP  Final   Special Requests   Final    NONE Performed at [facility], [place] [address], [place], [place] [address]    Gram Stain   Final    NO WBC SEEN NO ORGANISMS SEEN RESULT CALLED TO, READ BACK BY AND VERIFIED WITH: Olga Millers MD 06/19/18 2229 JDW    Culture   Final    RARE PROTEUS MIRABILIS NO ANAEROBES ISOLATED; CULTURE IN PROGRESS FOR 5 DAYS    Report Status PENDING  Incomplete    Microbiology: Recent Results (from the past 240 hour(s))  Anaerobic and Aerobic Culture     Status: None (Preliminary result)   Collection Time: 06/17/18  1:18 PM  Result Value Ref Range Status   MICRO NUMBER: [id number]  Preliminary   SPECIMEN QUALITY: Adequate  Preliminary   Source: LEFT HIP WOUND  Preliminary   STATUS: PRELIMINARY  Preliminary   GRAM STAIN:   Preliminary    Few White blood cells seen No epithelial cells seen Few Gram positive cocci in pairs   ANA RESULT:   Preliminary    No anaerobes isolated to date, continuing incubation.     MICRO NUMBER: [id number]  Final   SPECIMEN QUALITY: Adequate  Final   SOURCE: LEFT HIP WOUND  Final   STATUS: FINAL  Final   AER RESULT:   Final    Growth of skin flora (note: Growth does not include S. aureus, beta-hemolytic Streptococci or P. aeruginosa).  Aerobic/Anaerobic Culture (surgical/deep wound)     Status: None (Preliminary result)   Collection Time: 06/19/18  9:24 PM  Result Value Ref Range Status   Specimen Description WOUND LEFT HIP  Final   Special Requests   Final    NONE Performed at [facility], [place] [address], [place], [place] [address]    Gram Stain   Final    NO WBC SEEN NO ORGANISMS SEEN RESULT CALLED TO, READ BACK BY AND VERIFIED WITH: Olga Millers MD 06/19/18 2229 JDW    Culture   Final    RARE PROTEUS MIRABILIS NO ANAEROBES ISOLATED; CULTURE IN PROGRESS FOR 5 DAYS    Report Status PENDING  Incomplete    Radiographs and labs were personally reviewed by me.   Bobby Rumpf, MD [facility] [phone] 06/21/2018, 4:07 PM

## 2018-06-21 NOTE — Anesthesia Postprocedure Evaluation (Signed)
Anesthesia Post Note  Patient: Emma Lewis  Procedure(s) Performed: IRRIGATION AND DEBRIDEMENT LEFT HIP HEMATOMA ANTIBIOTIC BEADS, APPLICATION ON WOUND VAC (Left Hip)     Patient location during evaluation: PACU Anesthesia Type: General Level of consciousness: awake and alert Pain management: pain level controlled Vital Signs Assessment: post-procedure vital signs reviewed and stable Respiratory status: spontaneous breathing, nonlabored ventilation, respiratory function stable and patient connected to nasal cannula oxygen Cardiovascular status: blood pressure returned to baseline and stable Postop Assessment: no apparent nausea or vomiting Anesthetic complications: no    Last Vitals:  Vitals:   06/21/18 0432 06/21/18 1245  BP: (!) 107/47 (!) 105/52  Pulse: 73 81  Resp: 16 17  Temp: 36.4 C 36.8 C  SpO2: 97% 94%    Last Pain:  Vitals:   06/21/18 1700  TempSrc:   PainSc: 0-No pain                 Jamarrion Budai

## 2018-06-21 NOTE — Progress Notes (Signed)
   Subjective: 2 Days Post-Op Procedure(s) (LRB): IRRIGATION AND DEBRIDEMENT LEFT HIP HEMATOMA ANTIBIOTIC BEADS, APPLICATION ON WOUND VAC (Left) Patient reports pain as moderate.    Objective: Vital signs in last 24 hours: Temp:  [97.3 F (36.3 C)-97.6 F (36.4 C)] 97.6 F (36.4 C) (11/24 0432) Pulse Rate:  [73-77] 73 (11/24 0432) Resp:  [16-18] 16 (11/24 0432) BP: (107-112)/(47-55) 107/47 (11/24 0432) SpO2:  [97 %-98 %] 97 % (11/24 0432)  Intake/Output from previous day: 11/23 0701 - 11/24 0700 In: 450 [P.O.:360; I.V.:90] Out: 950 [Urine:950] Intake/Output this shift: Total I/O In: 360 [P.O.:360] Out: 100 [Drains:100]  Recent Labs    06/19/18 1557 06/20/18 0355 06/21/18 0342  HGB 8.9* 7.8* 7.3*   Recent Labs    06/20/18 0355 06/21/18 0342  WBC 7.7 9.8  RBC 2.91* 2.78*  HCT 25.7* 24.9*  PLT 433* 464*   Recent Labs    06/19/18 1557  NA 139  K 3.7  CL 107  CO2 23  BUN 13  CREATININE 0.73  GLUCOSE 84  CALCIUM 9.6   Recent Labs    06/19/18 1557  INR 1.19    Neurologically intact No results found.  Assessment/Plan: 2 Days Post-Op Procedure(s) (LRB): IRRIGATION AND DEBRIDEMENT LEFT HIP HEMATOMA ANTIBIOTIC BEADS, APPLICATION ON WOUND VAC (Left) Plan:  I called lab, prelim looks like possible staph and also a gram neg. .  I had given her 3 doses Ancef after surgery. She has bioabsorbable Vanc and Tobra beads placed in her hip from Surgery 11/22 Friday. Fluid looks like hematoma and no pus or necrotic tissue was found.  ID consult.  Likely IV ABX  PICC ,  per their Recommendations. Discussed with Dr. Johnnye Sima. Appreciate his help.   [name] 06/21/2018, 11:28 AM

## 2018-06-22 ENCOUNTER — Encounter (HOSPITAL_COMMUNITY): Payer: Self-pay | Admitting: Orthopaedic Surgery

## 2018-06-22 DIAGNOSIS — S7002XS Contusion of left hip, sequela: Secondary | ICD-10-CM

## 2018-06-22 DIAGNOSIS — A498 Other bacterial infections of unspecified site: Secondary | ICD-10-CM

## 2018-06-22 LAB — CBC
HEMATOCRIT: 28.2 % — AB (ref 36.0–46.0)
HEMOGLOBIN: 8.1 g/dL — AB (ref 12.0–15.0)
MCH: 26.1 pg (ref 26.0–34.0)
MCHC: 28.7 g/dL — ABNORMAL LOW (ref 30.0–36.0)
MCV: 91 fL (ref 80.0–100.0)
NRBC: 0 % (ref 0.0–0.2)
Platelets: 518 10*3/uL — ABNORMAL HIGH (ref 150–400)
RBC: 3.1 MIL/uL — ABNORMAL LOW (ref 3.87–5.11)
RDW: 15.9 % — AB (ref 11.5–15.5)
WBC: 10.2 10*3/uL (ref 4.0–10.5)

## 2018-06-22 MED ORDER — CEFTRIAXONE IV (FOR PTA / DISCHARGE USE ONLY): 2.0000 g | INTRAVENOUS | 0 refills | Status: DC

## 2018-06-22 MED ORDER — ASPIRIN 325 MG PO TBEC: 325.0000 mg | DELAYED_RELEASE_TABLET | Freq: Every day | ORAL | 0 refills | Status: DC

## 2018-06-22 MED ORDER — SODIUM CHLORIDE 0.9 % IV SOLN: 2.0000 g | INTRAVENOUS | 0 refills | Status: DC

## 2018-06-22 MED ORDER — HYDROCODONE-ACETAMINOPHEN 5-325 MG PO TABS: 1.0000 | ORAL_TABLET | Freq: Four times a day (QID) | ORAL | 0 refills | Status: DC | PRN

## 2018-06-22 MED ORDER — HEPARIN SOD (PORK) LOCK FLUSH 100 UNIT/ML IV SOLN
250.0000 [IU] | INTRAVENOUS | Status: AC | PRN
  Administered 2018-06-22: 250 [IU]

## 2018-06-22 MED ORDER — ENSURE ENLIVE PO LIQD: 237.0000 mL | Freq: Two times a day (BID) | ORAL | 12 refills | Status: DC

## 2018-06-22 NOTE — Progress Notes (Signed)
Discussed with husband and patient that the [facility] comes off on Saturday and they can do dressing changes.  She will see me on Tuesday.  IV antibiotics Rocephin 2 g daily x6 weeks per infectious disease team.

## 2018-06-22 NOTE — Progress Notes (Signed)
PHARMACY CONSULT NOTE FOR:  OUTPATIENT  PARENTERAL ANTIBIOTIC THERAPY (OPAT)  Indication: Deep tissue infection L hip Regimen: Ceftriaxone 2 gm every 24 hours End date: 08/01/18  IV antibiotic discharge orders are pended. To discharging provider:  please sign these orders via discharge navigator,  Select New Orders & click on the button choice - Manage This Unsigned Work.     Thank you for allowing pharmacy to be a part of this patient's care.  Susa Raring, PharmD, BCPS  Infectious Diseases Clinical Pharmacist Phone: [phone] 06/22/2018, 1:47 PM

## 2018-06-22 NOTE — Plan of Care (Signed)
  Problem: Education: Goal: Knowledge of General Education information will improve Description: Including pain rating scale, medication(s)/side effects and non-pharmacologic comfort measures Outcome: Progressing   Problem: Clinical Measurements: Goal: Ability to maintain clinical measurements within normal limits will improve Outcome: Progressing   

## 2018-06-22 NOTE — Progress Notes (Signed)
   Subjective: 3 Days Post-Op Procedure(s) (LRB): IRRIGATION AND DEBRIDEMENT LEFT HIP HEMATOMA ANTIBIOTIC BEADS, APPLICATION ON WOUND VAC (Left) Patient reports pain as mild and moderate.    Objective: Vital signs in last 24 hours: Temp:  [98 F (36.7 C)-98.6 F (37 C)] 98 F (36.7 C) (11/25 0507) Pulse Rate:  [83] 83 (11/25 0507) Resp:  [17-20] 20 (11/25 0507) BP: (95-106)/(51-56) 95/51 (11/25 0507) SpO2:  [89 %-93 %] 89 % (11/25 0507)  Intake/Output from previous day: 11/24 0701 - 11/25 0700 In: 889.3 [P.O.:840; IV Piggyback:49.3] Out: 100 [Drains:100] Intake/Output this shift: Total I/O In: 50 [IV Piggyback:50] Out: -   Recent Labs    06/19/18 1557 06/20/18 0355 06/21/18 0342 06/22/18 0317  HGB 8.9* 7.8* 7.3* 8.1*   Recent Labs    06/21/18 0342 06/22/18 0317  WBC 9.8 10.2  RBC 2.78* 3.10*  HCT 24.9* 28.2*  PLT 464* 518*   Recent Labs    06/19/18 1557  NA 139  K 3.7  CL 107  CO2 23  BUN 13  CREATININE 0.73  GLUCOSE 84  CALCIUM 9.6   Recent Labs    06/19/18 1557  INR 1.19    Neurologically intact [place] Ekg Site Rite  Result Date: 06/21/2018 If Site Rite image not attached, placement could not be confirmed due to current cardiac rhythm.   Assessment/Plan: 3 Days Post-Op Procedure(s) (LRB): IRRIGATION AND DEBRIDEMENT LEFT HIP HEMATOMA ANTIBIOTIC BEADS, APPLICATION ON WOUND VAC (Left) Up with therapy already today. D/C home on 6 wks Rocephin per ID team.  Home with provena. Office [place] on Tuesday [name] 06/22/2018, 1:13 PM

## 2018-06-22 NOTE — Progress Notes (Signed)
[facility] for Infectious Disease    Date of Admission:  06/19/2018   Total days of antibiotics 4        Day 2 ceftriaxone  ID: BLENDA WISECUP is a 67 y.o. 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  She underwent revision for severe metallosis with large amounts of metal debris embedded in the tissue on 04/29/2018 about 7 or 8 weeks ago complicated by hematoma and  left hip deep tissue infection with proteus Active Problems:   Hip hematoma, left   Hematoma of left hip    Subjective: Afebrile. Tolerating her left hip wound vac. Anxious to go home  Medications:  . acyclovir  400 mg Oral Daily  . aspirin EC  325 mg Oral Q breakfast  . calcium-vitamin D  1 tablet Oral BID  . docusate sodium  100 mg Oral BID  . feeding supplement (ENSURE ENLIVE)  237 mL Oral BID BM  . ferrous sulfate  325 mg Oral TID PC  . levothyroxine  100 mcg Oral QAC breakfast  . omega-3 acid ethyl esters  1 capsule Oral TID  . oxyCODONE  10 mg Oral Q12H  . simvastatin  40 mg Oral QHS  . traMADol  50 mg Oral Q6H    Objective: Vital signs in last 24 hours: Temp:  [98 F (36.7 C)-98.6 F (37 C)] 98 F (36.7 C) (11/25 0507) Pulse Rate:  [81-83] 83 (11/25 0507) Resp:  [17-20] 20 (11/25 0507) BP: (95-106)/(51-56) 95/51 (11/25 0507) SpO2:  [89 %-94 %] 89 % (11/25 0507)  Physical Exam  Constitutional: He is oriented to person, place, and time. He appears well-developed and well-nourished. No distress.  HENT:  Mouth/Throat: Oropharynx is clear and moist. No oropharyngeal exudate.  Cardiovascular: Normal rate, regular rhythm and normal heart sounds. Exam reveals no gallop and no friction rub.  No murmur heard.  Pulmonary/Chest: Effort normal and breath sounds normal. No respiratory distress. He has no wheezes.  Abdominal: Soft. Bowel sounds are normal. He exhibits no distension. There  is no tenderness.  Lymphadenopathy:  He has no cervical adenopathy.  Ext: left hip wound vac Neurological: He is alert and oriented to person, place, and time.  Skin: Skin is warm and dry. No rash noted. No erythema.  Psychiatric: He has a normal mood and affect. His behavior is normal.     Lab Results Recent Labs    06/19/18 1557  06/21/18 0342 06/22/18 0317  WBC 11.6*   < > 9.8 10.2  HGB 8.9*   < > 7.3* 8.1*  HCT 31.7*   < > 24.9* 28.2*  NA 139  --   --   --   K 3.7  --   --   --   CL 107  --   --   --   CO2 23  --   --   --   BUN 13  --   --   --   CREATININE 0.73  --   --   --    < > = values in this interval not displayed.   Liver Panel Recent Labs    06/19/18 1557  PROT 7.4  ALBUMIN 2.9*  AST 49*  ALT 34  ALKPHOS 318*  BILITOT 0.4   Sedimentation Rate Recent Labs    06/19/18 1557  ESRSEDRATE 122*   C-Reactive Protein Recent  Labs    06/20/18 0007  CRP 9.5*    Microbiology:  cx- showing proteus Studies/Results: [place] Ekg Site Rite  Result Date: 06/21/2018 If Site Rite image not attached, placement could not be confirmed due to current cardiac rhythm.    Assessment/Plan: Deep tissue left hip infection = will plan on doing ceftriaxone 2gm iv daily x 6 wk. Will arrange for home health coordinator to see if can coordinate with her home health of choice, [facility].    [facility] for Infectious Diseases Cell: [phone] Pager: [phone]  06/22/2018, 11:35 AM

## 2018-06-22 NOTE — Discharge Instructions (Signed)
Leave VAC on as long as it works. May remove it on Saturday and apply dressing. See Dr. Lorin Mercy on next Tuesday in office.

## 2018-06-22 NOTE — Progress Notes (Signed)
OPAT ORDERS:  Diagnosis: Deep tissue infection L hip   Culture Result: Proteus (pansens)  Allergies  Allergen Reactions  . Sulfa Antibiotics Other (See Comments)     Childhood allergy Mother said "I liked to have died"  . Fenofibrate Diarrhea    Reaction while taking with magnesium   Sed Rate  Date Value  06/19/2018 122 mm/hr (H)  04/21/2018 51 mm/h (H)   CRP (mg/dL)  Date Value  06/20/2018 9.5 (H)    Discharge antibiotics: Ceftriaxone 2gm IV Q24h   Duration: 6 weeks   End Date: January 4th 2020  Advance and Maintenance Per Protocol _x_ Please pull PIC at completion of IV antibiotics __ Please leave PIC in place until doctor has seen patient or been notified  Labs weekly while on IV antibiotics: _x_ CBC with differential _x_ CMP  Fax weekly labs to [phone]  Clinic Follow Up Appt: December 30th 2019 @ 9:30 am with Emma [name], NP @ [place], MSN, NP-C [facility] for Infectious Disease [email or web address] Pager: [phone] Office: [phone]

## 2018-06-22 NOTE — Progress Notes (Signed)
Pt switched to prevena wound vac and given oral and written instructions for care. Given x2 extra cannisters. Was educated with spouse on PICC line care and IV ABX administration. Did have spouse and pt return demonstrated on prevena vac connection, on/off mode, and PICC line flush. IV team flushed and capped PICC line. Pt escorted to care by NT.

## 2018-06-22 NOTE — Care Management Note (Signed)
Case Management Note  Patient Details  Name: Emma Lewis MRN: [id number] Date of Birth: 1950-11-18  Subjective/Objective:   67 yr old female s/p I & D of left hip hematoma, placement of antibiotic beads and application of wound vac.                 Action/Plan: Patient was previously setup with [facility], no changes. PICC line has been inserted for IV antibiotics for 6 weeks. Patient will have family support at discharge.    Expected Discharge Date:  06/22/18               Expected Discharge Plan:  [place]  In-House Referral:  NA  Discharge planning Services  CM Consult  Post Acute Care Choice:  Home Health Choice offered to:  Patient  DME Arranged:    DME Agency:     HH Arranged:  RN [place] Agency:  [facility]  Status of Service:  Completed, signed off  If discussed at [place] of Stay Meetings, dates discussed:    Additional Comments:  Ninfa Meeker, RN 06/22/2018, 2:50 PM

## 2018-06-22 NOTE — Progress Notes (Signed)
Physical Therapy Treatment Patient Details Name: Emma Lewis MRN: [id number] DOB: 02/09/1951 Today's Date: 06/22/2018    History of Present Illness Pt is a 67 y.o. female admitted due to L hip hematoma requiring surgical evacuation, antibiotic bead and vac placement. She previously underwent L THA revision 04/29/18. She fell at home sustaining L hip dislocation and periprosthetic fx 06/01/18 and underwent ORIF L femur.     PT Comments    Patient seen for mobility progression. Pt tolerated activity well and requires min guard for OOB mobility. Pt is able to maintain weight bearing status and precautions throughout. Current plan remains appropriate.    Follow Up Recommendations  No PT follow up;Supervision/Assistance - 24 hour     Equipment Recommendations  None recommended by PT    Recommendations for Other Services       Precautions / Restrictions Precautions Precautions: Posterior Hip;Fall Precaution Comments: pt recalls precautions Restrictions Weight Bearing Restrictions: Yes LLE Weight Bearing: Partial weight bearing LLE Partial Weight Bearing Percentage or Pounds: 50    Mobility  Bed Mobility Overal bed mobility: Modified Independent Bed Mobility: Supine to Sit           General bed mobility comments: use of rail and HOB elevated; no physical assist needed  Transfers Overall transfer level: Needs assistance Equipment used: Rolling walker (2 wheeled) Transfers: Sit to/from Stand Sit to Stand: Min guard         General transfer comment: safe hand placement demonstrated; min guard for safety  Ambulation/Gait Ambulation/Gait assistance: Min guard Gait Distance (Feet): 5 Feet Assistive device: Rolling walker (2 wheeled) Gait Pattern/deviations: Step-to pattern;Decreased stance time - left;Decreased step length - right;Decreased weight shift to left     General Gait Details: pt maintains weight bearing status well; cues for posture, using bilat UE to decrease  L LE weight bearing, and for R step length    Stairs             Wheelchair Mobility    Modified Rankin (Stroke Patients Only)       Balance Overall balance assessment: Needs assistance   Sitting balance-Leahy Scale: Good     Standing balance support: Bilateral upper extremity supported Standing balance-Leahy Scale: Poor                              Cognition Arousal/Alertness: Awake/alert Behavior During Therapy: WFL for tasks assessed/performed Overall Cognitive Status: Within Functional Limits for tasks assessed                                        Exercises      General Comments        Pertinent Vitals/Pain Pain Assessment: Faces Faces Pain Scale: Hurts little more Pain Location: L hip Pain Descriptors / Indicators: Guarding;Sore Pain Intervention(s): Limited activity within patient's tolerance;Monitored during session;Premedicated before session;Repositioned    Home Living                      Prior Function            PT Goals (current goals can now be found in the care plan section) Progress towards PT goals: Progressing toward goals    Frequency    Min 3X/week      PT Plan Current plan remains appropriate    Co-evaluation  AM-PAC PT "6 Clicks" Mobility   Outcome Measure  Help needed turning from your back to your side while in a flat bed without using bedrails?: A Little Help needed moving from lying on your back to sitting on the side of a flat bed without using bedrails?: A Little Help needed moving to and from a bed to a chair (including a wheelchair)?: A Little Help needed standing up from a chair using your arms (e.g., wheelchair or bedside chair)?: A Little Help needed to walk in hospital room?: A Little Help needed climbing 3-5 steps with a railing? : Total 6 Click Score: 16    End of Session Equipment Utilized During Treatment: Gait belt Activity Tolerance:  Patient tolerated treatment well Patient left: in chair;with call bell/phone within reach Nurse Communication: Mobility status PT Visit Diagnosis: Unsteadiness on feet (R26.81);Muscle weakness (generalized) (M62.81);Difficulty in walking, not elsewhere classified (R26.2);Pain;Other abnormalities of gait and mobility (R26.89) Pain - Right/Left: Left Pain - part of body: Hip     Time: 4098-1191 PT Time Calculation (min) (ACUTE ONLY): 29 min  Charges:  $Gait Training: 8-22 mins $Therapeutic Activity: 8-22 mins                     Earney Navy, PTA Acute Rehabilitation Services Pager: [phone] Office: [phone]     Darliss Cheney 06/22/2018, 10:02 AM

## 2018-06-22 NOTE — Progress Notes (Signed)
[facility] Infusion Coordinator will follow pt with ID team to support home infusion pharmacy services for home IV ABX at DC.   If patient discharges after hours, please call [phone].   Larry Sierras 06/22/2018, 11:33 AM

## 2018-06-23 ENCOUNTER — Telehealth (INDEPENDENT_AMBULATORY_CARE_PROVIDER_SITE_OTHER): Payer: Self-pay | Admitting: Orthopaedic Surgery

## 2018-06-23 ENCOUNTER — Encounter (INDEPENDENT_AMBULATORY_CARE_PROVIDER_SITE_OTHER): Payer: Self-pay

## 2018-06-23 ENCOUNTER — Ambulatory Visit (INDEPENDENT_AMBULATORY_CARE_PROVIDER_SITE_OTHER): Payer: Medicare Other | Admitting: Orthopaedic Surgery

## 2018-06-23 DIAGNOSIS — T84031D Mechanical loosening of internal left hip prosthetic joint, subsequent encounter: Secondary | ICD-10-CM | POA: Diagnosis not present

## 2018-06-23 DIAGNOSIS — N189 Chronic kidney disease, unspecified: Secondary | ICD-10-CM | POA: Diagnosis not present

## 2018-06-23 DIAGNOSIS — M170 Bilateral primary osteoarthritis of knee: Secondary | ICD-10-CM | POA: Diagnosis not present

## 2018-06-23 DIAGNOSIS — H8110 Benign paroxysmal vertigo, unspecified ear: Secondary | ICD-10-CM | POA: Diagnosis not present

## 2018-06-23 DIAGNOSIS — T8452XD Infection and inflammatory reaction due to internal left hip prosthesis, subsequent encounter: Secondary | ICD-10-CM | POA: Diagnosis not present

## 2018-06-23 DIAGNOSIS — I129 Hypertensive chronic kidney disease with stage 1 through stage 4 chronic kidney disease, or unspecified chronic kidney disease: Secondary | ICD-10-CM | POA: Diagnosis not present

## 2018-06-23 LAB — ANAEROBIC AND AEROBIC CULTURE
MICRO NUMBER:: 91399198
MICRO NUMBER:: 91399199
SPECIMEN QUALITY: ADEQUATE
SPECIMEN QUALITY:: ADEQUATE

## 2018-06-23 NOTE — Telephone Encounter (Signed)
Please call and work patient in for an appointment.  She is post op. Thanks.

## 2018-06-23 NOTE — Telephone Encounter (Signed)
Patient called advised Dr Lorin Mercy wants her back in the office after surgery on 06/30/18. The number to contact patient is [phone]

## 2018-06-25 LAB — AEROBIC/ANAEROBIC CULTURE (SURGICAL/DEEP WOUND): GRAM STAIN: NONE SEEN

## 2018-06-29 DIAGNOSIS — M170 Bilateral primary osteoarthritis of knee: Secondary | ICD-10-CM | POA: Diagnosis not present

## 2018-06-29 DIAGNOSIS — T8452XD Infection and inflammatory reaction due to internal left hip prosthesis, subsequent encounter: Secondary | ICD-10-CM | POA: Diagnosis not present

## 2018-06-29 DIAGNOSIS — T84031D Mechanical loosening of internal left hip prosthetic joint, subsequent encounter: Secondary | ICD-10-CM | POA: Diagnosis not present

## 2018-06-29 DIAGNOSIS — L089 Local infection of the skin and subcutaneous tissue, unspecified: Secondary | ICD-10-CM | POA: Diagnosis not present

## 2018-06-29 DIAGNOSIS — N189 Chronic kidney disease, unspecified: Secondary | ICD-10-CM | POA: Diagnosis not present

## 2018-06-29 DIAGNOSIS — I129 Hypertensive chronic kidney disease with stage 1 through stage 4 chronic kidney disease, or unspecified chronic kidney disease: Secondary | ICD-10-CM | POA: Diagnosis not present

## 2018-06-29 DIAGNOSIS — H8110 Benign paroxysmal vertigo, unspecified ear: Secondary | ICD-10-CM | POA: Diagnosis not present

## 2018-06-29 DIAGNOSIS — T8452XA Infection and inflammatory reaction due to internal left hip prosthesis, initial encounter: Secondary | ICD-10-CM | POA: Diagnosis not present

## 2018-06-30 ENCOUNTER — Encounter (INDEPENDENT_AMBULATORY_CARE_PROVIDER_SITE_OTHER): Payer: Self-pay | Admitting: Orthopaedic Surgery

## 2018-06-30 ENCOUNTER — Ambulatory Visit (INDEPENDENT_AMBULATORY_CARE_PROVIDER_SITE_OTHER): Payer: Medicare Other

## 2018-06-30 ENCOUNTER — Ambulatory Visit (INDEPENDENT_AMBULATORY_CARE_PROVIDER_SITE_OTHER): Payer: Medicare Other | Admitting: Orthopaedic Surgery

## 2018-06-30 VITALS — BP 119/70 | HR 90 | Ht 65.0 in | Wt 193.0 lb

## 2018-06-30 DIAGNOSIS — M978XXD Periprosthetic fracture around other internal prosthetic joint, subsequent encounter: Secondary | ICD-10-CM | POA: Diagnosis not present

## 2018-06-30 DIAGNOSIS — Z96649 Presence of unspecified artificial hip joint: Secondary | ICD-10-CM | POA: Diagnosis not present

## 2018-06-30 MED ORDER — HYDROCODONE-ACETAMINOPHEN 5-325 MG PO TABS: 1.0000 | ORAL_TABLET | Freq: Four times a day (QID) | ORAL | 0 refills | Status: DC | PRN

## 2018-06-30 NOTE — Progress Notes (Signed)
Post-Op Visit Note   Patient: Emma Lewis           Date of Birth: 07/24/1951           MRN: [id number] Visit Date: 06/30/2018 PCP: Philmore Pali, NP   Assessment & Plan: Patient returns post periprosthetic fracture with hip subluxation requiring long plate multiple cables and screws.  She has antibiotic beads with Tobra and vancomycin absorbable beads.  She has had serous wound drainage and is on IV Rocephin x6 weeks.  Cultures positive for rare Proteus mirabilis sensitive to every antibiotic tested.  Recheck 3 weeks.  She has elevated CRP and sed rate as expected with large revision surgery and observable bees that will cause continued serous wound drainage as expected.  Erythema is decreased and incision appears to be gradually healing.  Recheck 3 weeks.  Chief Complaint:  Chief Complaint  Patient presents with  . Left Leg - Follow-up    06/19/18 I&D Left hip hematoma, antibiotic beads, wound vac application   Visit Diagnoses:  1. Periprosthetic fracture of femur at tip of prosthesis, subsequent encounter     Plan: Increase protein intake IV antibiotics for 6 weeks she is now 1 week out.  Recheck 3 weeks.  Increase ambulation at 75% weightbearing.  Follow-Up Instructions: Return in about 3 weeks (around 07/21/2018).   Orders:  Orders Placed This Encounter  Procedures  . XR HIP UNILAT W OR W/O PELVIS 1V LEFT   Meds ordered this encounter  Medications  . HYDROcodone-acetaminophen (NORCO) 5-325 MG tablet    Sig: Take 1-2 tablets by mouth every 6 (six) hours as needed for moderate pain.    Dispense:  30 tablet    Refill:  0    Imaging: No results found.  PMFS History: Patient Active Problem List   Diagnosis Date Noted  . Proteus infection   . Hip hematoma, left 06/19/2018  . Hematoma of left hip 06/19/2018  . Hip dislocation, left ([place]) 05/29/2018  . Peri-prosthetic femur fracture at tip of prosthesis 05/29/2018  . Prediabetes   . Popping sound of knee joint   .  Hypoalbuminemia due to protein-calorie malnutrition ([place])   . Transaminitis   . Leukocytosis   . Postoperative pain   . Failure of left total hip arthroplasty ([place]) 05/01/2018  . Acute blood loss anemia   . Leukemoid reaction   . 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 Joint effusion of pelvis or thigh, left 04/22/2018  . Trochanteric bursitis, left hip 04/22/2018  . History of left hip replacement 11/12/2017  . Bilateral primary osteoarthritis of knee 11/12/2017  . GERD (gastroesophageal reflux disease) 01/17/2017  . Hypothyroidism 01/17/2017  . Vertigo 01/16/2017  . Nausea and vomiting 01/16/2017  . Essential hypertension 01/16/2017  . Fatty liver 01/16/2017  . Failed total hip arthroplasty ([place]) 06/10/2016  . Pelvic mass 06/15/2015  . Closed wedge compression fracture of fifth lumbar vertebra ([place])   . Cervical spondylosis 02/09/2014  . DDD (degenerative disc disease), lumbar 05/13/2013  . OA (osteoarthritis) 05/13/2013   Past Medical History:  Diagnosis Date  . Arthritis    "all over my body" (04/29/2018)  . Chronic lower back pain   . Fatty liver   . Fever blister    Takes Acyclovir  . GERD (gastroesophageal reflux disease)   . Hyperlipemia   . Hypothyroidism   . Migraines   . Osteopenia   . Pneumonia    "once; years ago" (04/29/2018)  . PONV (postoperative nausea and vomiting)   .  Pre-diabetes   . Seasonal allergies   . Vertigo     Family History  Problem Relation Age of Onset  . Lymphoma Mother     Past Surgical History:  Procedure Laterality Date  . ANTERIOR CERVICAL DECOMP/DISCECTOMY FUSION N/A 02/09/2014   Procedure: ANTERIOR CERVICAL DECOMPRESSION/DISCECTOMY FUSION 2 LEVELS;  Surgeon: [name], MD;  Location: [place];  Service: Orthopedics;  Laterality: N/A;  C4-5, C5-6 Anterior Cervical Discectomy and Fusion, Allograft, Plate  . BACK SURGERY    . CARPAL TUNNEL RELEASE Right 2018  . CATARACT EXTRACTION W/ INTRAOCULAR LENS  IMPLANT, BILATERAL    . COLONOSCOPY    .  FIXATION KYPHOPLASTY LUMBAR SPINE  X 2  . HIP ARTHROPLASTY Left 2011  . INCISION AND DRAINAGE HIP Left 06/19/2018   Procedure: IRRIGATION AND DEBRIDEMENT LEFT HIP HEMATOMA ANTIBIOTIC BEADS, APPLICATION ON WOUND VAC;  Surgeon: [name], MD;  Location: [place];  Service: Orthopedics;  Laterality: Left;  . JOINT REPLACEMENT    . KNEE ARTHROSCOPY Right   . ORIF PERIPROSTHETIC FRACTURE Left 06/01/2018   Procedure: OPEN REDUCTION INTERNAL FIXATION (ORIF) PERIPROSTHETIC FEMUR FRACTURE;  Surgeon: [name], MD;  Location: [place] [facility];  Service: Orthopedics;  Laterality: Left;  . PATELLA FRACTURE SURGERY Right ~ 1990   "put metal in"  . PATELLA HARDWARE REMOVAL Right    "took the metal out"  . REVISION TOTAL HIP ARTHROPLASTY Left 04/29/2018  . ROBOTIC ASSISTED BILATERAL SALPINGO OOPHERECTOMY Bilateral 08/01/2015   Procedure: ROBOTIC ASSISTED BILATERAL SALPINGO OOPHORECTOMY;  Surgeon: Everitt Amber, MD;  Location: [facility];  Service: Gynecology;  Laterality: Bilateral;  . SHOULDER OPEN ROTATOR CUFF REPAIR Right    3 TOTAL  . SHOULDER SURGERY Right   . TOTAL HIP ARTHROPLASTY Right 01/21/2018   Procedure: RIGHT TOTAL HIP ARTHROPLASTY DIRECT ANTERIOR;  Surgeon: [name], MD;  Location: [place];  Service: Orthopedics;  Laterality: Right;  . TOTAL HIP REVISION Left 06/10/2016   Procedure: TOTAL HIP REVISION ARTHROPLASTY;  Surgeon: Rod Can, MD;  Location: [place];  Service: Orthopedics;  Laterality: Left;  . TOTAL HIP REVISION Left 04/29/2018   Procedure: left total hip arthroplasty revision posterior approach;  Surgeon: [name], MD;  Location: [place];  Service: Orthopedics;  Laterality: Left;  . WRIST GANGLION EXCISION Left 1970s   Social History   Occupational History  . Not on file  Tobacco Use  . Smoking status: Never Smoker  . Smokeless tobacco: Never Used  Substance and Sexual Activity  . Alcohol use: Yes    Comment: 04/29/2018 "maybe 2 drinks/year"  . Drug use: Never  . Sexual  activity: Not Currently

## 2018-07-03 NOTE — Discharge Summary (Signed)
Patient ID: Emma Lewis MRN: [id number] DOB/AGE: 1951/05/17 67 y.o.  Admit date: 06/19/2018 Discharge date: 07/03/2018  Admission Diagnoses:  Active Problems:   Hip hematoma, left   Hematoma of left hip   Proteus infection   Discharge Diagnoses:  Active Problems:   Hip hematoma, left   Hematoma of left hip   Proteus infection  status post Procedure(s): IRRIGATION AND DEBRIDEMENT LEFT HIP HEMATOMA ANTIBIOTIC BEADS, APPLICATION ON WOUND VAC  Past Medical History:  Diagnosis Date  . Arthritis    "all over my body" (04/29/2018)  . Chronic lower back pain   . Fatty liver   . Fever blister    Takes Acyclovir  . GERD (gastroesophageal reflux disease)   . Hyperlipemia   . Hypothyroidism   . Migraines   . Osteopenia   . Pneumonia    "once; years ago" (04/29/2018)  . PONV (postoperative nausea and vomiting)   . Pre-diabetes   . Seasonal allergies   . Vertigo     Surgeries: Procedure(s): IRRIGATION AND DEBRIDEMENT LEFT HIP HEMATOMA ANTIBIOTIC BEADS, APPLICATION ON WOUND VAC on 06/19/2018   Consultants:   Discharged Condition: Improved  Hospital Course: Emma Lewis is an 67 y.o. female who was admitted 06/19/2018 for operative treatment of hip hematoma. Patient failed conservative treatments (please see the history and physical for the specifics) and had severe unremitting pain that affects sleep, daily activities and work/hobbies. After pre-op clearance, the patient was taken to the operating room on 06/19/2018 and underwent  Procedure(s): IRRIGATION AND DEBRIDEMENT LEFT HIP HEMATOMA ANTIBIOTIC BEADS, APPLICATION ON WOUND VAC.    Patient was given perioperative antibiotics:  Anti-infectives (From admission, onward)   Start     Dose/Rate Route Frequency Ordered Stop   06/22/18 0000  cefTRIAXone 2 g in sodium chloride 0.9 % 100 mL  Status:  Discontinued     2 g 200 mL/hr over 30 Minutes Intravenous Every 24 hours 06/22/18 1325 06/22/18    06/22/18 0000  cefTRIAXone  (ROCEPHIN) IVPB     2 g Intravenous Every 24 hours 06/22/18 1352 08/01/18 2359   06/21/18 1700  cefTRIAXone (ROCEPHIN) 2 g in sodium chloride 0.9 % 100 mL IVPB  Status:  Discontinued     2 g 200 mL/hr over 30 Minutes Intravenous Every 24 hours 06/21/18 1624 06/22/18 2052   06/21/18 0000  cefTRIAXone (ROCEPHIN) IVPB  Status:  Discontinued     2 g Intravenous Every 24 hours 06/21/18 1756 06/22/18    06/20/18 1000  acyclovir (ZOVIRAX) tablet 400 mg  Status:  Discontinued    Note to Pharmacy:  Patient taking differently: Take one tablet (400 mg) by mouth daily, may increase to one tablet (400 mg) twice daily as needed for fever blisters.     400 mg Oral Daily 06/19/18 2348 06/22/18 2052   06/20/18 0000  ceFAZolin (ANCEF) IVPB 2g/100 mL premix     2 g 200 mL/hr over 30 Minutes Intravenous Every 6 hours 06/19/18 2348 06/20/18 1122   06/19/18 2109  vancomycin (VANCOCIN) powder  Status:  Discontinued       As needed 06/19/18 2110 06/19/18 2213   06/19/18 2103  tobramycin (NEBCIN) powder  Status:  Discontinued       As needed 06/19/18 2109 06/19/18 2213       Patient was given sequential compression devices and early ambulation to prevent DVT.   Patient benefited maximally from hospital stay and there were no complications. At the time of discharge, the patient was  urinating/moving their bowels without difficulty, tolerating a regular diet, pain is controlled with oral pain medications and they have been cleared by PT/OT.   Recent vital signs: No data found.   Recent laboratory studies: No results for input(s): WBC, HGB, HCT, PLT, NA, K, CL, CO2, BUN, CREATININE, GLUCOSE, INR, CALCIUM in the last 72 hours.  Invalid input(s): PT, 2   Discharge Medications:   Allergies as of 06/22/2018      Reactions   Sulfa Antibiotics Other (See Comments)    Childhood allergy Mother said "I liked to have died"   Fenofibrate Diarrhea   Reaction while taking with magnesium      Medication List    STOP  taking these medications   doxycycline 100 MG capsule Commonly known as:  VIBRAMYCIN   ibuprofen 200 MG tablet Commonly known as:  ADVIL,MOTRIN   oxyCODONE 5 MG immediate release tablet Commonly known as:  Oxy IR/ROXICODONE   oxyCODONE-acetaminophen 5-325 MG tablet Commonly known as:  PERCOCET/ROXICET   oxyCODONE-acetaminophen 7.5-325 MG tablet Commonly known as:  PERCOCET     TAKE these medications   acyclovir 400 MG tablet Commonly known as:  ZOVIRAX Take 1 tablet (400 mg total) by mouth 2 (two) times daily. What changed:    when to take this  additional instructions   aspirin 325 MG EC tablet Take 1 tablet (325 mg total) by mouth daily with breakfast.   Biotin Plus Keratin 10000-100 MCG-MG Tabs Take 1 tablet by mouth 3 (three) times daily.   CALCIUM 600 + D PO Take 1 tablet by mouth 3 (three) times daily.   cefTRIAXone  IVPB Commonly known as:  ROCEPHIN Inject 2 g into the vein daily. Indication:  Deep tissue infection L hip Last Day of Therapy:  08/01/18 Labs - Once weekly:  CBC/D and BMP, Labs - Every other week:  ESR and CRP   clobetasol cream 0.05 % Commonly known as:  TEMOVATE Apply 1 application topically 2 (two) times daily. What changed:    when to take this  reasons to take this   feeding supplement (ENSURE ENLIVE) Liqd Take 237 mLs by mouth 2 (two) times daily between meals.   levothyroxine 100 MCG tablet Commonly known as:  SYNTHROID, LEVOTHROID Take 1 tablet (100 mcg total) by mouth daily before breakfast.   meclizine 25 MG tablet Commonly known as:  ANTIVERT Take 1 tablet (25 mg total) by mouth 3 (three) times daily as needed for dizziness. Vertigo. Also available OTC What changed:    when to take this  reasons to take this  additional instructions   methocarbamol 500 MG tablet Commonly known as:  ROBAXIN Take 1 tablet (500 mg total) by mouth every 6 (six) hours as needed for muscle spasms.   nystatin-triamcinolone  cream Commonly known as:  MYCOLOG II Apply 1 application topically 2 (two) times daily as needed (irritation).   OMEGA-3 FISH OIL PO Take 1 capsule by mouth 3 (three) times daily.   omeprazole 20 MG tablet Commonly known as:  PRILOSEC OTC Take 10 mg by mouth daily.   simvastatin 40 MG tablet Commonly known as:  ZOCOR Take 1 tablet (40 mg total) by mouth at bedtime.   THERATEARS 0.25 % Soln Generic drug:  Carboxymethylcellulose Sodium Place 1 drop into both eyes 2 (two) times daily as needed (dry eyes).   Vitamin D3 50 MCG (2000 UT) Tabs Take 2,000 Units by mouth 2 (two) times daily.  Home Infusion Instuctions  (From admission, onward)         Start     Ordered   06/21/18 0000  Home infusion instructions [facility] May follow [place] Dosing Protocol; May administer Cathflo as needed to maintain patency of vascular access device.; Flushing of vascular access device: per [facility] Protocol: 0.9% NaCl pre/post medica...    Question Answer Comment  Instructions May follow [place] Dosing Protocol   Instructions May administer Cathflo as needed to maintain patency of vascular access device.   Instructions Flushing of vascular access device: per [facility] Protocol: 0.9% NaCl pre/post medication administration and prn patency; Heparin 100 u/ml, 54m for implanted ports and Heparin 10u/ml, 58mfor all other central venous catheters.   Instructions May follow [facility] Anaphylaxis Protocol for First Dose Administration in the home: 0.9% NaCl at 25-50 ml/hr to maintain IV access for protocol meds. Epinephrine 0.3 ml IV/IM PRN and Benadryl 25-50 IV/IM PRN s/s of anaphylaxis.   Instructions [facility] Infusion Coordinator (RN) to assist per patient IV care needs in the home PRN.      06/21/18 1756          Diagnostic Studies: Xr Femur Min 2 Views Left  Result Date: 06/17/2018 AP frog-leg leg lateral x-rays left hip obtained and reviewed.  This shows long revision  stem long lateral plate multiple screws cable.  Hip remains reduced.  The osteotomy remains in satisfactory position.  All cables are in position. Impression: Post-ORIF femur fracture post hip revision with cable plate system left hip.  Xr Hip Unilat W Or W/o Pelvis 1v Left  Result Date: 07/01/2018 Single AP x-ray left hip obtained.  This shows well located total hip arthroplasty with longstem multiple cables.  No proximal migration of the trochanter area Impression: Post hip revision for hip subluxation and periprosthetic fracture.  Comparison x-rays show satisfactory position from immediate postop.  [place] Site Rite  Result Date: 06/21/2018 If Site Rite image not attached, placement could not be confirmed due to current cardiac rhythm.   Discharge Instructions    Home infusion instructions [facility] May follow [facility] Protocol; May administer Cathflo as needed to maintain patency of vascular access device.; Flushing [facility] vascular [facility]: 0.9% NaCl pre/post medica...   Complete by:  As directed    Instructions:  May follow [facility] Protocol   Instructions:  May administer Cathflo as needed to maintain patency of vascular access device.   Instructions:  Flushing of vascular access device: [facility]: 0.9% NaCl pre/post medication administration and prn patency; Heparin 100 u/ml, 54m84mor implanted ports and Heparin 10u/ml, 54ml20mr all other central venous catheters.   Instructions:  May follow [facility] Anaphylaxis Protocol for First Dose Administration in the home: 0.9% NaCl at 25-50 ml/hr to maintain IV access for protocol meds. Epinephrine 0.3 ml IV/IM PRN and Benadryl 25-50 IV/IM PRN s/s of anaphylaxis.   Instructions:  [facility] Coordinator (RN) to assist per patient IV care needs in the home PRN.      Follow-up Information    [facility]              Follow up on 07/27/2018.   Why:  9:30  am appointment with [facility] [place]. Please arrive 15 minutes early to update your records. Kindly call to reschedule if unable to make this appointment.  Contact information: [address]  [place] [address]       [name], MD Follow up in 1 week(s).   Specialty:  Orthopedic Surgery Contact information: [place] [address] [place] Follow up.   Specialty:  [place] Why:  A representative from [facility] will contact you to arrange start time for Home Health RN for IV antibiotics  Contact information: [address] [place] [address] [phone]        [facility] - Dme Follow up.   Why:  A representative from [place] will contact you to deliver IV antibiotics Contact information: [address] [address] [phone]           Discharge Plan:  discharge to home  Disposition:     Signed: Benjiman Core 07/03/2018, 3:08 PM

## 2018-07-06 ENCOUNTER — Other Ambulatory Visit (INDEPENDENT_AMBULATORY_CARE_PROVIDER_SITE_OTHER): Payer: Self-pay | Admitting: Orthopaedic Surgery

## 2018-07-06 ENCOUNTER — Telehealth (INDEPENDENT_AMBULATORY_CARE_PROVIDER_SITE_OTHER): Payer: Self-pay | Admitting: Orthopaedic Surgery

## 2018-07-06 DIAGNOSIS — T84031D Mechanical loosening of internal left hip prosthetic joint, subsequent encounter: Secondary | ICD-10-CM | POA: Diagnosis not present

## 2018-07-06 DIAGNOSIS — N189 Chronic kidney disease, unspecified: Secondary | ICD-10-CM | POA: Diagnosis not present

## 2018-07-06 DIAGNOSIS — H8110 Benign paroxysmal vertigo, unspecified ear: Secondary | ICD-10-CM | POA: Diagnosis not present

## 2018-07-06 DIAGNOSIS — T8452XA Infection and inflammatory reaction due to internal left hip prosthesis, initial encounter: Secondary | ICD-10-CM | POA: Diagnosis not present

## 2018-07-06 DIAGNOSIS — L089 Local infection of the skin and subcutaneous tissue, unspecified: Secondary | ICD-10-CM | POA: Diagnosis not present

## 2018-07-06 DIAGNOSIS — T8452XD Infection and inflammatory reaction due to internal left hip prosthesis, subsequent encounter: Secondary | ICD-10-CM | POA: Diagnosis not present

## 2018-07-06 DIAGNOSIS — M170 Bilateral primary osteoarthritis of knee: Secondary | ICD-10-CM | POA: Diagnosis not present

## 2018-07-06 DIAGNOSIS — I129 Hypertensive chronic kidney disease with stage 1 through stage 4 chronic kidney disease, or unspecified chronic kidney disease: Secondary | ICD-10-CM | POA: Diagnosis not present

## 2018-07-06 MED ORDER — METHOCARBAMOL 500 MG PO TABS: 500.0000 mg | ORAL_TABLET | Freq: Three times a day (TID) | ORAL | 0 refills | Status: DC | PRN

## 2018-07-06 MED ORDER — HYDROCODONE-ACETAMINOPHEN 5-325 MG PO TABS: 1.0000 | ORAL_TABLET | Freq: Four times a day (QID) | ORAL | 0 refills | Status: DC | PRN

## 2018-07-06 NOTE — Telephone Encounter (Signed)
Patient's husband Bland Span called asked if Anntoinette can get something for the spasm in her leg. He said Avaeh has been having spasm everyday since surgery. He advised she had surgery 2 weeks ago. The number to contact patient is [phone]

## 2018-07-06 NOTE — Telephone Encounter (Signed)
Please advise 

## 2018-07-06 NOTE — Telephone Encounter (Signed)
Sent to pharmacy. I called patient and advised. 

## 2018-07-06 NOTE — Telephone Encounter (Signed)
Call in robaxin 500mg  one po q 8 hrs  Prn  Spasm #40

## 2018-07-07 ENCOUNTER — Inpatient Hospital Stay (INDEPENDENT_AMBULATORY_CARE_PROVIDER_SITE_OTHER): Payer: Medicare Other | Admitting: Orthopaedic Surgery

## 2018-07-08 ENCOUNTER — Encounter (INDEPENDENT_AMBULATORY_CARE_PROVIDER_SITE_OTHER): Payer: Self-pay | Admitting: Orthopaedic Surgery

## 2018-07-09 ENCOUNTER — Telehealth (INDEPENDENT_AMBULATORY_CARE_PROVIDER_SITE_OTHER): Payer: Self-pay | Admitting: Orthopaedic Surgery

## 2018-07-09 MED ORDER — HYDROCODONE-ACETAMINOPHEN 5-325 MG PO TABS: 1.0000 | ORAL_TABLET | Freq: Four times a day (QID) | ORAL | 0 refills | Status: DC | PRN

## 2018-07-09 NOTE — Telephone Encounter (Signed)
Patient's husband would like for you to give him a call in regards to the patient's hydrocodone prescription.  CB#[phone].  Thank you.

## 2018-07-09 NOTE — Telephone Encounter (Signed)
I talked with patient this morning. Dr. Lorin Mercy has tried calling her as well x 2 with no answer. He will try her again.

## 2018-07-13 DIAGNOSIS — N189 Chronic kidney disease, unspecified: Secondary | ICD-10-CM | POA: Diagnosis not present

## 2018-07-13 DIAGNOSIS — T84031D Mechanical loosening of internal left hip prosthetic joint, subsequent encounter: Secondary | ICD-10-CM | POA: Diagnosis not present

## 2018-07-13 DIAGNOSIS — I129 Hypertensive chronic kidney disease with stage 1 through stage 4 chronic kidney disease, or unspecified chronic kidney disease: Secondary | ICD-10-CM | POA: Diagnosis not present

## 2018-07-13 DIAGNOSIS — T8452XA Infection and inflammatory reaction due to internal left hip prosthesis, initial encounter: Secondary | ICD-10-CM | POA: Diagnosis not present

## 2018-07-13 DIAGNOSIS — T8452XD Infection and inflammatory reaction due to internal left hip prosthesis, subsequent encounter: Secondary | ICD-10-CM | POA: Diagnosis not present

## 2018-07-13 DIAGNOSIS — H8110 Benign paroxysmal vertigo, unspecified ear: Secondary | ICD-10-CM | POA: Diagnosis not present

## 2018-07-13 DIAGNOSIS — L089 Local infection of the skin and subcutaneous tissue, unspecified: Secondary | ICD-10-CM | POA: Diagnosis not present

## 2018-07-13 DIAGNOSIS — M170 Bilateral primary osteoarthritis of knee: Secondary | ICD-10-CM | POA: Diagnosis not present

## 2018-07-15 DIAGNOSIS — T84031D Mechanical loosening of internal left hip prosthetic joint, subsequent encounter: Secondary | ICD-10-CM | POA: Diagnosis not present

## 2018-07-15 DIAGNOSIS — N189 Chronic kidney disease, unspecified: Secondary | ICD-10-CM | POA: Diagnosis not present

## 2018-07-15 DIAGNOSIS — H8110 Benign paroxysmal vertigo, unspecified ear: Secondary | ICD-10-CM | POA: Diagnosis not present

## 2018-07-15 DIAGNOSIS — I129 Hypertensive chronic kidney disease with stage 1 through stage 4 chronic kidney disease, or unspecified chronic kidney disease: Secondary | ICD-10-CM | POA: Diagnosis not present

## 2018-07-15 DIAGNOSIS — M170 Bilateral primary osteoarthritis of knee: Secondary | ICD-10-CM | POA: Diagnosis not present

## 2018-07-15 DIAGNOSIS — T8452XD Infection and inflammatory reaction due to internal left hip prosthesis, subsequent encounter: Secondary | ICD-10-CM | POA: Diagnosis not present

## 2018-07-18 ENCOUNTER — Emergency Department (HOSPITAL_COMMUNITY)
Admission: EM | Admit: 2018-07-18 | Discharge: 2018-07-18 | Disposition: A | Discharge status: Home / Self Care | Payer: Medicare Other | Source: Home / Self Care | Attending: Emergency Medicine | Admitting: Emergency Medicine

## 2018-07-18 ENCOUNTER — Emergency Department (HOSPITAL_COMMUNITY): Payer: Medicare Other

## 2018-07-18 ENCOUNTER — Encounter (HOSPITAL_COMMUNITY): Payer: Self-pay | Admitting: Emergency Medicine

## 2018-07-18 ENCOUNTER — Other Ambulatory Visit: Payer: Self-pay

## 2018-07-18 ENCOUNTER — Emergency Department (HOSPITAL_BASED_OUTPATIENT_CLINIC_OR_DEPARTMENT_OTHER)
Admit: 2018-07-18 | Discharge: 2018-07-18 | Disposition: A | Discharge status: Home / Self Care | Payer: Medicare Other | Attending: Emergency Medicine | Admitting: Emergency Medicine

## 2018-07-18 DIAGNOSIS — E039 Hypothyroidism, unspecified: Secondary | ICD-10-CM

## 2018-07-18 DIAGNOSIS — R0602 Shortness of breath: Secondary | ICD-10-CM

## 2018-07-18 DIAGNOSIS — L7632 Postprocedural hematoma of skin and subcutaneous tissue following other procedure: Secondary | ICD-10-CM | POA: Diagnosis not present

## 2018-07-18 DIAGNOSIS — M7989 Other specified soft tissue disorders: Secondary | ICD-10-CM | POA: Diagnosis not present

## 2018-07-18 DIAGNOSIS — K219 Gastro-esophageal reflux disease without esophagitis: Secondary | ICD-10-CM | POA: Diagnosis not present

## 2018-07-18 DIAGNOSIS — R2689 Other abnormalities of gait and mobility: Secondary | ICD-10-CM | POA: Diagnosis not present

## 2018-07-18 DIAGNOSIS — J9811 Atelectasis: Secondary | ICD-10-CM | POA: Diagnosis not present

## 2018-07-18 DIAGNOSIS — R52 Pain, unspecified: Secondary | ICD-10-CM

## 2018-07-18 DIAGNOSIS — Z7982 Long term (current) use of aspirin: Secondary | ICD-10-CM | POA: Insufficient documentation

## 2018-07-18 DIAGNOSIS — R079 Chest pain, unspecified: Secondary | ICD-10-CM | POA: Diagnosis not present

## 2018-07-18 DIAGNOSIS — M549 Dorsalgia, unspecified: Secondary | ICD-10-CM | POA: Diagnosis not present

## 2018-07-18 DIAGNOSIS — E785 Hyperlipidemia, unspecified: Secondary | ICD-10-CM

## 2018-07-18 DIAGNOSIS — R071 Chest pain on breathing: Secondary | ICD-10-CM | POA: Diagnosis not present

## 2018-07-18 DIAGNOSIS — E46 Unspecified protein-calorie malnutrition: Secondary | ICD-10-CM | POA: Diagnosis not present

## 2018-07-18 DIAGNOSIS — K760 Fatty (change of) liver, not elsewhere classified: Secondary | ICD-10-CM | POA: Diagnosis not present

## 2018-07-18 DIAGNOSIS — Z79899 Other long term (current) drug therapy: Secondary | ICD-10-CM | POA: Insufficient documentation

## 2018-07-18 DIAGNOSIS — D649 Anemia, unspecified: Secondary | ICD-10-CM | POA: Diagnosis not present

## 2018-07-18 DIAGNOSIS — M25552 Pain in left hip: Secondary | ICD-10-CM | POA: Diagnosis not present

## 2018-07-18 DIAGNOSIS — T8189XA Other complications of procedures, not elsewhere classified, initial encounter: Secondary | ICD-10-CM | POA: Diagnosis not present

## 2018-07-18 DIAGNOSIS — T8452XA Infection and inflammatory reaction due to internal left hip prosthesis, initial encounter: Secondary | ICD-10-CM | POA: Diagnosis not present

## 2018-07-18 DIAGNOSIS — R000 Tachycardia, unspecified: Secondary | ICD-10-CM | POA: Diagnosis not present

## 2018-07-18 LAB — CBC WITH DIFFERENTIAL/PLATELET
Abs Immature Granulocytes: 0.04 10*3/uL (ref 0.00–0.07)
Basophils Absolute: 0 10*3/uL (ref 0.0–0.1)
Basophils Relative: 0 %
Eosinophils Absolute: 0.3 10*3/uL (ref 0.0–0.5)
Eosinophils Relative: 3 %
HCT: 26.3 % — ABNORMAL LOW (ref 36.0–46.0)
Hemoglobin: 8 g/dL — ABNORMAL LOW (ref 12.0–15.0)
Immature Granulocytes: 0 %
Lymphocytes Relative: 14 %
Lymphs Abs: 1.5 10*3/uL (ref 0.7–4.0)
MCH: 25.6 pg — ABNORMAL LOW (ref 26.0–34.0)
MCHC: 30.4 g/dL (ref 30.0–36.0)
MCV: 84.3 fL (ref 80.0–100.0)
Monocytes Absolute: 0.7 10*3/uL (ref 0.1–1.0)
Monocytes Relative: 7 %
Neutro Abs: 8 10*3/uL — ABNORMAL HIGH (ref 1.7–7.7)
Neutrophils Relative %: 76 %
Platelets: 336 10*3/uL (ref 150–400)
RBC: 3.12 MIL/uL — ABNORMAL LOW (ref 3.87–5.11)
RDW: 16.4 % — ABNORMAL HIGH (ref 11.5–15.5)
WBC: 10.6 10*3/uL — AB (ref 4.0–10.5)
nRBC: 0 % (ref 0.0–0.2)

## 2018-07-18 LAB — I-STAT TROPONIN, ED
Troponin i, poc: 0 ng/mL (ref 0.00–0.08)
Troponin i, poc: 0 ng/mL (ref 0.00–0.08)

## 2018-07-18 LAB — BASIC METABOLIC PANEL
Anion gap: 10 (ref 5–15)
BUN: 11 mg/dL (ref 8–23)
CO2: 23 mmol/L (ref 22–32)
Calcium: 9.1 mg/dL (ref 8.9–10.3)
Chloride: 103 mmol/L (ref 98–111)
Creatinine, Ser: 0.71 mg/dL (ref 0.44–1.00)
GFR calc Af Amer: >60 mL/min (ref >60)
GFR calc non Af Amer: >60 mL/min (ref >60)
GLUCOSE: 112 mg/dL — AB (ref 70–99)
Potassium: 3.5 mmol/L (ref 3.5–5.1)
Sodium: 136 mmol/L (ref 135–145)

## 2018-07-18 MED ORDER — IBUPROFEN 800 MG PO TABS
800.0000 mg | ORAL_TABLET | Freq: Once | ORAL | Status: AC
  Administered 2018-07-18: 800 mg via ORAL
  Filled 2018-07-18: qty 1

## 2018-07-18 MED ORDER — IOPAMIDOL (ISOVUE-370) INJECTION 76%
INTRAVENOUS | Status: AC
  Administered 2018-07-18: 75 mL
  Filled 2018-07-18: qty 100

## 2018-07-18 NOTE — ED Triage Notes (Addendum)
Per EMS patient c/o shortness of breath, sent by Dr. Lorin Mercy to r/o PE.  Patient had surgery in October.  C/o shortness of breath since she had PICC line placed.

## 2018-07-18 NOTE — ED Provider Notes (Addendum)
[facility] EMERGENCY DEPARTMENT Provider Note   CSN: [id number] Arrival date & time: 07/18/18  1055     History   Chief Complaint Chief Complaint  Patient presents with  . Shortness of Breath    HPI DOVE GRESHAM is a 67 y.o. female with h/o recent left hip surgery by Dr Lorin Mercy c/b periprosthetic serous wound on IV antibiotics via RUE PICC line presents for shortness of breath and right sided chest pains. Onset suddenly soon after getting PICC line one month ago.  She thinks it is because of the PICC line.  SOB/CP are intermittent, sometimes with exertion but also at rest, when laying in bed. CP is sharp, right sided with some radiating into sternum, worse sometimes with taking deep breaths, moving right arm.  Other associated symptoms include generalized weakness, feeling more tired, getting more winded.  Moving around the house with roller walker makes her more tired more quickly. Her husband has been telling her to come to ER for this but she has not come because she doesn't want to spend christmas in the hospital.  Husband called Dr Lorin Mercy who recommended ER visit to r/o PE.   No fever, chills, cough, orthopnea, PND.  Has had left hip pain and mild swelling since surgery. Redness to surgical area unchanged per patient. No h/o DVT/PE.  Stopped aspirin as directed by Dr Lorin Mercy after surgery, no other anticoagulants.   HPI  Past Medical History:  Diagnosis Date  . Arthritis    "all over my body" (04/29/2018)  . Chronic lower back pain   . Fatty liver   . Fever blister    Takes Acyclovir  . GERD (gastroesophageal reflux disease)   . Hyperlipemia   . Hypothyroidism   . Migraines   . Osteopenia   . Pneumonia    "once; years ago" (04/29/2018)  . PONV (postoperative nausea and vomiting)   . Pre-diabetes   . Seasonal allergies   . Vertigo     Patient Active Problem List   Diagnosis Date Noted  . Proteus infection   . Hip hematoma, left 06/19/2018  . Hematoma of  left hip 06/19/2018  . Hip dislocation, left ([place]) 05/29/2018  . Peri-prosthetic femur fracture at tip of prosthesis 05/29/2018  . Prediabetes   . Popping sound of knee joint   . Hypoalbuminemia due to protein-calorie malnutrition ([place])   . Transaminitis   . Leukocytosis   . Postoperative pain   . 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 Acute blood loss anemia   . Leukemoid reaction   . 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 Joint effusion of pelvis or thigh, left 04/22/2018  . Trochanteric bursitis, left hip 04/22/2018  . History of left hip replacement 11/12/2017  . Bilateral primary osteoarthritis of knee 11/12/2017  . GERD (gastroesophageal reflux disease) 01/17/2017  . Hypothyroidism 01/17/2017  . Vertigo 01/16/2017  . Nausea and vomiting 01/16/2017  . Essential hypertension 01/16/2017  . Fatty liver 01/16/2017  . Failed total hip arthroplasty ([place]) 06/10/2016  . Pelvic mass 06/15/2015  . Closed wedge compression fracture of fifth lumbar vertebra ([place])   . Cervical spondylosis 02/09/2014  . DDD (degenerative disc disease), lumbar 05/13/2013  . OA (osteoarthritis) 05/13/2013    Past Surgical History:  Procedure Laterality Date  . ANTERIOR CERVICAL DECOMP/DISCECTOMY FUSION N/A 02/09/2014   Procedure: ANTERIOR CERVICAL DECOMPRESSION/DISCECTOMY FUSION 2 LEVELS;  Surgeon: [name], MD;  Location: [facility];  Service: Orthopedics;  Laterality: N/A;  C4-5, C5-6  Anterior Cervical Discectomy and Fusion, Allograft, Plate  . BACK SURGERY    . CARPAL TUNNEL RELEASE Right 2018  . CATARACT EXTRACTION W/ INTRAOCULAR LENS  IMPLANT, BILATERAL    . COLONOSCOPY    . FIXATION KYPHOPLASTY LUMBAR SPINE  X 2  . HIP ARTHROPLASTY Left 2011  . INCISION AND DRAINAGE HIP Left 06/19/2018   Procedure: IRRIGATION AND DEBRIDEMENT LEFT HIP HEMATOMA ANTIBIOTIC BEADS, APPLICATION ON WOUND VAC;  Surgeon: [name], MD;  Location: [facility];  Service: Orthopedics;  Laterality: Left;  . JOINT  REPLACEMENT    . KNEE ARTHROSCOPY Right   . ORIF PERIPROSTHETIC FRACTURE Left 06/01/2018   Procedure: OPEN REDUCTION INTERNAL FIXATION (ORIF) PERIPROSTHETIC FEMUR FRACTURE;  Surgeon: [name], MD;  Location: [facility];  Service: Orthopedics;  Laterality: Left;  . PATELLA FRACTURE SURGERY Right ~ 1990   "put metal in"  . PATELLA HARDWARE REMOVAL Right    "took the metal out"  . REVISION TOTAL HIP ARTHROPLASTY Left 04/29/2018  . ROBOTIC ASSISTED BILATERAL SALPINGO OOPHERECTOMY Bilateral 08/01/2015   Procedure: ROBOTIC ASSISTED BILATERAL SALPINGO OOPHORECTOMY;  Surgeon: Everitt Amber, MD;  Location: [facility];  Service: Gynecology;  Laterality: Bilateral;  . SHOULDER OPEN ROTATOR CUFF REPAIR Right    3 TOTAL  . SHOULDER SURGERY Right   . TOTAL HIP ARTHROPLASTY Right 01/21/2018   Procedure: RIGHT TOTAL HIP ARTHROPLASTY DIRECT ANTERIOR;  Surgeon: [name], MD;  Location: [facility];  Service: Orthopedics;  Laterality: Right;  . TOTAL HIP REVISION Left 06/10/2016   Procedure: TOTAL HIP REVISION ARTHROPLASTY;  Surgeon: Rod Can, MD;  Location: [facility];  Service: Orthopedics;  Laterality: Left;  . TOTAL HIP REVISION Left 04/29/2018   Procedure: left total hip arthroplasty revision posterior approach;  Surgeon: [name], MD;  Location: [facility];  Service: Orthopedics;  Laterality: Left;  . WRIST GANGLION EXCISION Left 1970s     OB History    Gravida  1   Para      Term      Preterm      AB  0   Living  1     SAB      TAB      Ectopic      Multiple  0   Live Births               Home Medications    Prior to Admission medications   Medication Sig Start Date End Date Taking? Authorizing Provider  acyclovir (ZOVIRAX) 400 MG tablet Take 1 tablet (400 mg total) by mouth 2 (two) times daily. Patient taking differently: Take 400 mg by mouth See admin instructions. Take one tablet (400 mg) by mouth daily, may increase to one tablet (400 mg) twice daily as needed for fever  blisters. 05/15/18   Angiulli, Lavon Paganini, PA-C  aspirin EC 325 MG EC tablet Take 1 tablet (325 mg total) by mouth daily with breakfast. 06/23/18   [name], MD  Calcium Carb-Cholecalciferol (CALCIUM 600 + D PO) Take 1 tablet by mouth 3 (three) times daily.     [provider]  Carboxymethylcellulose Sodium (THERATEARS) 0.25 % SOLN Place 1 drop into both eyes 2 (two) times daily as needed (dry eyes).    [provider]  cefTRIAXone (ROCEPHIN) IVPB Inject 2 g into the vein daily. Indication:  Deep tissue infection L hip Last Day of Therapy:  08/01/18 Labs - Once weekly:  CBC/D and BMP, Labs - Every other week:  ESR and CRP 06/22/18 08/01/18  [name], MD  Cholecalciferol (VITAMIN D3) 2000 units TABS Take 2,000 Units by mouth 2 (two) times daily.     [provider]  clobetasol cream (TEMOVATE) 2.24 % Apply 1 application topically 2 (two) times daily. Patient taking differently: Apply 1 application topically daily as needed (irritation).  11/04/17   Huel Cote, NP  feeding supplement, ENSURE ENLIVE, (ENSURE ENLIVE) LIQD Take 237 mLs by mouth 2 (two) times daily between meals. 06/22/18   [name], MD  HYDROcodone-acetaminophen (NORCO) 5-325 MG tablet Take 1-2 tablets by mouth every 6 (six) hours as needed for moderate pain. 07/09/18   [name], MD  levothyroxine (SYNTHROID, LEVOTHROID) 100 MCG tablet Take 1 tablet (100 mcg total) by mouth daily before breakfast. 05/15/18   Angiulli, Lavon Paganini, PA-C  meclizine (ANTIVERT) 25 MG tablet Take 1 tablet (25 mg total) by mouth 3 (three) times daily as needed for dizziness. Vertigo. Also available OTC Patient taking differently: Take 25 mg by mouth daily as needed (vertigo).  01/18/17   Rai, Vernelle Emerald, MD  methocarbamol (ROBAXIN) 500 MG tablet Take 1 tablet (500 mg total) by mouth every 6 (six) hours as needed for muscle spasms. 06/05/18   Lanae Crumbly, PA-C  methocarbamol (ROBAXIN) 500 MG tablet Take 1 tablet (500 mg  total) by mouth every 8 (eight) hours as needed for muscle spasms. 07/06/18   [name], MD  nystatin-triamcinolone (MYCOLOG II) cream Apply 1 application topically 2 (two) times daily as needed (irritation).  12/30/17   [provider]  Omega-3 Fatty Acids (OMEGA-3 FISH OIL PO) Take 1 capsule by mouth 3 (three) times daily.     [provider]  omeprazole (PRILOSEC OTC) 20 MG tablet Take 10 mg by mouth daily.    [provider]  simvastatin (ZOCOR) 40 MG tablet Take 1 tablet (40 mg total) by mouth at bedtime. 05/15/18   Angiulli, Lavon Paganini, PA-C  Specialty Vitamins Products (BIOTIN PLUS KERATIN) 10000-100 MCG-MG TABS Take 1 tablet by mouth 3 (three) times daily.     [provider]    Family History Family History  Problem Relation Age of Onset  . Lymphoma Mother     Social History Social History   Tobacco Use  . Smoking status: Never Smoker  . Smokeless tobacco: Never Used  Substance Use Topics  . Alcohol use: Yes    Comment: 04/29/2018 "maybe 2 drinks/year"  . Drug use: Never     Allergies   Sulfa antibiotics and Fenofibrate   Review of Systems Review of Systems  Respiratory: Positive for shortness of breath.   Cardiovascular: Positive for chest pain and leg swelling.  Musculoskeletal: Positive for gait problem (left leg).  All other systems reviewed and are negative.  Physical Exam Updated Vital Signs BP 109/61 (BP Location: Left Arm)   Pulse 87   Temp 98.5 F (36.9 C) (Oral)   Resp 18   Ht 5' 5"  (1.651 m)   Wt 88.5 kg   SpO2 99%   BMI 32.45 kg/m   Physical Exam Vitals signs and nursing note reviewed.  Constitutional:      Appearance: She is well-developed.     Comments: Non toxic  HENT:     Head: Normocephalic and atraumatic.     Nose: Nose normal.  Eyes:     Conjunctiva/sclera: Conjunctivae normal.     Pupils: Pupils are equal, round, and reactive to light.  Neck:  Musculoskeletal: Normal range of motion.    Cardiovascular:     Rate and Rhythm: Normal rate and regular rhythm.     Comments: Trace pitting edema up to left knee. Mild proximal calf tenderness. No asymmetric LE edema, erythema, warmth. 1+ radial and DP pulses bilaterally. No reproducible chest wall tenderness. No SOB reported with HOB flat.  Pulmonary:     Effort: Pulmonary effort is normal.     Breath sounds: Normal breath sounds.  Abdominal:     General: Bowel sounds are normal.     Palpations: Abdomen is soft.     Tenderness: There is no abdominal tenderness.     Comments: No G/R/R. No suprapubic or CVA tenderness. Negative Murphy's and McBurney's. Active BS to lower quadrants.   Musculoskeletal: Normal range of motion.  Skin:    General: Skin is warm and dry.     Capillary Refill: Capillary refill takes less than 2 seconds.  Neurological:     Mental Status: She is alert and oriented to person, place, and time.  Psychiatric:        Behavior: Behavior normal.      ED Treatments / Results  Labs (all labs ordered are listed, but only abnormal results are displayed) Labs Reviewed  CBC WITH DIFFERENTIAL/PLATELET - Abnormal; Notable for the following components:      Result Value   WBC 10.6 (*)    RBC 3.12 (*)    Hemoglobin 8.0 (*)    HCT 26.3 (*)    MCH 25.6 (*)    RDW 16.4 (*)    Neutro Abs 8.0 (*)    All other components within normal limits  BASIC METABOLIC PANEL - Abnormal; Notable for the following components:   Glucose, Bld 112 (*)    All other components within normal limits  CULTURE, BLOOD (ROUTINE X 2)  CULTURE, BLOOD (ROUTINE X 2)  I-STAT TROPONIN, ED  I-STAT TROPONIN, ED    EKG EKG Interpretation  Date/Time:  Saturday July 18 2018 11:09:08 EST Ventricular Rate:  93 PR Interval:    QRS Duration: 92 QT Interval:  378 QTC Calculation: 471 R Axis:   28 Text Interpretation:  Sinus rhythm Abnormal R-wave progression, early transition Borderline T abnormalities, anterior leads Confirmed by Lennice Sites [phone]) on 07/18/2018 11:33:04 AM Also confirmed by Lennice Sites [phone]), editor Philomena Doheny [phone])  on 07/18/2018 12:13:25 PM   Radiology Dg Chest 2 View  Result Date: 07/18/2018 CLINICAL DATA:  Shortness of breath and chest tightness several weeks. EXAM: CHEST - 2 VIEW COMPARISON:  04/29/2018 FINDINGS: Right-sided PICC line has tip over the SVC. Lungs are hypoinflated with slight interval worsening mild left basilar opacification likely atelectasis, although early infection is possible. No evidence of effusion. Cardiomediastinal silhouette is unremarkable. Stable mild compression deformity over the mid to upper thoracic spine. Remainder of the exam is unchanged. IMPRESSION: Mild interval worsening left base opacification likely atelectasis although early infection is possible. Stable mild thoracic spine compression fracture. Electronically Signed   By: Marin Olp M.D.   On: 07/18/2018 11:58   Dg Pelvis 1-2 Views  Result Date: 07/18/2018 CLINICAL DATA:  Lateral LEFT hip pain, 3 prior surgeries EXAM: PELVIS - 1-2 VIEW COMPARISON:  06/30/2018 FINDINGS: BILATERAL hip prostheses. Bones demineralized. Lateral femoral plate, proximal screws, and multiple cerclage wires at the LEFT femur post ORIF of a periprosthetic fracture, distal extent of hardware not imaged on this study. Osseous demineralization/resorption at healing fracture proximal femur. Lucency at the proximal LEFT femoral  diaphysis between the stem of the femoral component and the lateral plate could represent osteolysis at site of healing fracture though a destructive lesion is not completely excluded. No additional fracture, dislocation, or bone destruction. Prior vertebroplasties at L3-L5. IMPRESSION: Post ORIF of a periprosthetic fracture of the LEFT femur. Lucencies at the proximal femur may related to resorption at previously identified fracture planes though this makes it difficult to completely exclude an underlying lytic  lesion, recommend attention on follow-up imaging. Electronically Signed   By: Lavonia Dana M.D.   On: 07/18/2018 14:23   Ct Angio Chest Pe W And/or Wo Contrast  Result Date: 07/18/2018 CLINICAL DATA:  Shortness of breath, dyspnea, abnormal chest radiograph question pneumonia versus atelectasis, recent hip surgery EXAM: CT ANGIOGRAPHY CHEST WITH CONTRAST TECHNIQUE: Multidetector CT imaging of the chest was performed using the standard protocol during bolus administration of intravenous contrast. Multiplanar CT image reconstructions and MIPs were obtained to evaluate the vascular anatomy. CONTRAST:  28m ISOVUE-370 IOPAMIDOL (ISOVUE-370) INJECTION 76% IV COMPARISON:  None FINDINGS: Cardiovascular: Aorta normal caliber without aneurysm or dissection. Minimal atherosclerotic calcification aorta. Heart unremarkable. No pericardial effusion. Tip of RIGHT arm PICC line is within SVC. Pulmonary arteries suboptimally opacified but grossly patent. No definite evidence of pulmonary embolism. Mediastinum/Nodes: Esophagus normal appearance. Mildly enlarged subcarinal lymph node 12 mm short axis image 39. Few additional normal sized mediastinal lymph nodes. Base of cervical region normal appearance. No hilar or axillary adenopathy. Lungs/Pleura: Subsegmental atelectasis BILATERAL lower lobes. Remaining lungs clear. No infiltrate, pleural effusion, or pneumothorax. Calcified granuloma RIGHT upper lobe. Upper Abdomen: 2.6 x 2.7 cm diameter LEFT adrenal mass with macroscopic foci of fat consistent with adrenal myelolipoma. Calcified granulomata within spleen. Visualized upper abdomen otherwise unremarkable. 9 mm hypervascular focus RIGHT lobe liver image 75 question atypical hemangioma. Musculoskeletal: Diffuse osseous demineralization. Chronic appearing superior endplate compression fractures of T6 and T12. Review of the MIP images confirms the above findings. IMPRESSION: No definite evidence of pulmonary embolism. Subsegmental  atelectasis BILATERAL lower lobes. Single nonspecific mildly enlarged subcarinal lymph node 12 mm short axis. 2.6 x 2.7 cm diameter LEFT adrenal myelolipoma. Aortic Atherosclerosis (ICD10-I70.0). Electronically Signed   By: MLavonia DanaM.D.   On: 07/18/2018 14:01   Dg Femur Min 2 Views Left  Result Date: 07/18/2018 CLINICAL DATA:  Lateral LEFT hip pain, prior surgery EXAM: LEFT FEMUR 2 VIEWS COMPARISON:  06/03/2018 FINDINGS: Osseous demineralization. LEFT hip prosthesis with long stem LEFT femoral component. Additional lateral plate, proximal/distal screws and multiple cerclage wires at LEFT femur. Area of bony resorption is identified approximately at the level of the lesser trochanter extending laterally and distally question resorption at healing fracture. No additional fracture, dislocation, or bone destruction. Mild diffuse knee joint space narrowing. IMPRESSION: Post ORIF of a periprosthetic fracture of the LEFT femur. Bony lucencies at the proximal LEFT femoral diaphysis likely reflect resorption at prior fracture though difficult to entirely exclude underlying lytic lesion, recommend attention on follow-up imaging. Hardware intact without new fracture or dislocation. Electronically Signed   By: MLavonia DanaM.D.   On: 07/18/2018 14:27   Vas UKoreaLower Extremity Venous (dvt) (mc And Wl 7a-7p)  Result Date: 07/18/2018  Lower Venous Study Indications: Swelling.  Performing Technologist: GOliver HumRVT  Examination Guidelines: A complete evaluation includes B-mode imaging, spectral Doppler, color Doppler, and power Doppler as needed of all accessible portions of each vessel. Bilateral testing is considered an integral part of a complete examination. Limited examinations for reoccurring indications may  be performed as noted.  Right Venous Findings: +---+---------------+---------+-----------+----------+-------+    CompressibilityPhasicitySpontaneityPropertiesSummary  +---+---------------+---------+-----------+----------+-------+ CFVFull           Yes      Yes                          +---+---------------+---------+-----------+----------+-------+  Left Venous Findings: +---------+---------------+---------+-----------+----------+-------+          CompressibilityPhasicitySpontaneityPropertiesSummary +---------+---------------+---------+-----------+----------+-------+ CFV      Full           Yes      Yes                          +---------+---------------+---------+-----------+----------+-------+ SFJ      Full                                                 +---------+---------------+---------+-----------+----------+-------+ FV Prox  Full                                                 +---------+---------------+---------+-----------+----------+-------+ FV Mid   Full                                                 +---------+---------------+---------+-----------+----------+-------+ FV DistalFull                                                 +---------+---------------+---------+-----------+----------+-------+ PFV      Full                                                 +---------+---------------+---------+-----------+----------+-------+ POP      Full           Yes      Yes                          +---------+---------------+---------+-----------+----------+-------+ PTV      Full                                                 +---------+---------------+---------+-----------+----------+-------+ PERO     Full                                                 +---------+---------------+---------+-----------+----------+-------+    Summary: Right: No evidence of common femoral vein obstruction. Left: There is no evidence of deep vein thrombosis in the lower extremity. No cystic structure found in the popliteal fossa.  *See table(s) above for measurements and observations.  Preliminary      Procedures Procedures (including critical care time)  Medications Ordered in ED Medications  iopamidol (ISOVUE-370) 76 % injection (75 mLs  Contrast Given 07/18/18 1319)  ibuprofen (ADVIL,MOTRIN) tablet 800 mg (800 mg Oral Given 07/18/18 1604)     Initial Impression / Assessment and Plan / ED Course  I have reviewed the triage vital signs and the nursing notes.  Pertinent labs & imaging results that were available during my care of the patient were reviewed by me and considered in my medical decision making (see chart for details).  Clinical Course as of Jul 19 1607  Sat Jul 18, 2018  1419 IMPRESSION: No definite evidence of pulmonary embolism.  Subsegmental atelectasis BILATERAL lower lobes.  Single nonspecific mildly enlarged subcarinal lymph node 12 mm short axis.  2.6 x 2.7 cm diameter LEFT adrenal myelolipoma.  Aortic Atherosclerosis (ICD10-I70.0).  CT Angio Chest PE W and/or Wo Contrast [CG]  1446 IMPRESSION: Post ORIF of a periprosthetic fracture of the LEFT femur.  Bony lucencies at the proximal LEFT femoral diaphysis likely reflect resorption at prior fracture though difficult to entirely exclude underlying lytic lesion, recommend attention on follow-up imaging.  Hardware intact without new fracture or dislocation.   Electronically Signed By: Lavonia Dana M.D. On: 07/18/2018 14:27  DG Femur Min 2 Views Left [CG]    Clinical Course User Index [CG] Kinnie Feil, PA-C   67 year old is here with chest pain and shortness of breath.  Onset 1 month ago.  This is intermittent, sometimes with exertion, at rest, movement of right arm where PICC is.  She is concerned that this is from her PICC line.  Has had associated generalized weakness, feeling more fatigued, moving around with her roller walker makes her tired quicker.  Sent to ER by Dr. Lorin Mercy to rule out PE.  She has been on IV antibiotics for postsurgical wound. On exam VS WNL. Trace pitting  edema to LLE and calf tenderness.   1600: LLE vascular US negative left lower extremity ultrasound, her mild swelling and leg pain likely from post op changes.  Skin to the left hip is slightly erythematous and tender but no significant streaking of cellulitis.  X-rays obtained of the left pelvis/hip and femur without complicating features.  Clinical exam is not consistent with abscess or expanding cellulitis.  No leukocytosis, fever.  Given ongoing shortness of breath, recent surgery CTA a obtained to rule out PE, this was negative.  EKG w/o ischemic changes. Trop x 2 negative. Her heart score is less than 3.  Cardiac risk factors include age, elevated BMI, HLD.  Echo 2018 with normal EF %.  She does not look hypervolemic and does not have edema on CXR.  HF considered unlikely.  Hgb is 8, chronic stable. She is considered appropriate for discharge.  I suspect her shortness of breath/generalized weakness is multifactorial most likely from physical deconditioning from recent surgery.  Given her symptomatology, benign work-up in the ER, heart score, she is appropriate follow-up with cardiology for further evaluation of her intermittent atypical chest pain. Discussed plan with pt and husband who are in agreement. Shared with ED MD.  Final Clinical Impressions(s) / ED Diagnoses   Final diagnoses:  Exertional shortness of breath    ED Discharge Orders    None        Kinnie Feil, PA-C 07/18/18 [place], [place], DO 07/18/18 1658

## 2018-07-18 NOTE — ED Notes (Signed)
Patient verbalized understanding of discharge instructions and denies any further needs or questions at this time. VS stable. Patient ambulatory with steady gait. Assisted to ED entrance in wheelchair.   

## 2018-07-18 NOTE — ED Notes (Signed)
Patient transported to x-ray. ?

## 2018-07-18 NOTE — ED Provider Notes (Signed)
Medical screening examination/treatment/procedure(s) were conducted as a shared visit with non-physician practitioner(s) and myself.  I personally evaluated the patient during the encounter. Briefly, the patient is a 67 y.o. female is a 67 year old female with history of hypothyroidism, high cholesterol, migraines who presents to the ED with shortness of breath, left leg pain.  Patient with recent left hip surgery repair.  Patient currently on IV antibiotics through a PICC line from postop infection.  Patient with no cough, no sputum production.  Has some mild swelling to the left lower extremity compared to the right.  Overall has clear breath sounds on exam.  Unremarkable vitals.  Patient with chest x-ray that showed no signs of pneumonia, pneumothorax.  Likely atelectasis.  DVT study was unremarkable.  PE study showed no acute PE or pneumonia.  Has bibasilar atelectasis.  Troponin negative x2, doubt ACS.  X-rays of the left hip and femur show no hardware issues.  Patient with no significant anemia, electrolyte abnormality.  Overall work-up is unremarkable.  Patient likely with pain secondary to hardware issues.  Leg swelling likely secondary to recent surgery as well.  Overall weakness is likely due to general decompensation as patient has been fairly bedbound since her hip surgery.  Recommend follow-up with primary care doctor and orthopedics as scheduled.  Will likely need intense rehab when she is cleared.  This chart was dictated using voice recognition software.  Despite best efforts to proofread,  errors can occur which can change the documentation meaning.    EKG Interpretation  Date/Time:  Saturday July 18 2018 11:09:08 EST Ventricular Rate:  93 PR Interval:    QRS Duration: 92 QT Interval:  378 QTC Calculation: 471 R Axis:   28 Text Interpretation:  Sinus rhythm Abnormal R-wave progression, early transition Borderline T abnormalities, anterior leads Confirmed by Lennice Sites [phone])  on 07/18/2018 11:33:04 AM Also confirmed by Lennice Sites [phone]), editor Philomena Doheny [phone])  on 07/18/2018 12:13:25 PM           Lennice Sites, DO 07/18/18 1550

## 2018-07-18 NOTE — Progress Notes (Signed)
Left lower extremity venous duplex has been completed. Negative for DVT. Results were given to Carmon Sails PA.  07/18/18 12:05 PM Emma Lewis RVT

## 2018-07-18 NOTE — ED Notes (Signed)
ED Provider at bedside. 

## 2018-07-18 NOTE — ED Notes (Signed)
E-signature pad not working at time of discharge.

## 2018-07-18 NOTE — ED Notes (Signed)
EDP states okay for patient to eat/drink - provided graham crackers and diet coke.

## 2018-07-18 NOTE — Discharge Instructions (Signed)
You were seen in the ER for chest discomfort and shortness of breath.  Work-up today is reassuring.  I suspect your symptoms are from slow, physical deconditioning from bedrest and recent surgeries.  I have messaged Dr. Lorin Mercy to update on work-up today.  I recommend you check in with him on Monday.  Return to the ER for chest pain or shortness of breath with exertion, worsening symptoms, one-sided leg swelling or calf pain, fevers, chills.

## 2018-07-18 NOTE — ED Notes (Signed)
Patient transported to CT 

## 2018-07-20 ENCOUNTER — Encounter: Payer: Self-pay | Admitting: Orthopaedic Surgery

## 2018-07-20 DIAGNOSIS — I129 Hypertensive chronic kidney disease with stage 1 through stage 4 chronic kidney disease, or unspecified chronic kidney disease: Secondary | ICD-10-CM | POA: Diagnosis not present

## 2018-07-20 DIAGNOSIS — L089 Local infection of the skin and subcutaneous tissue, unspecified: Secondary | ICD-10-CM | POA: Diagnosis not present

## 2018-07-20 DIAGNOSIS — M170 Bilateral primary osteoarthritis of knee: Secondary | ICD-10-CM | POA: Diagnosis not present

## 2018-07-20 DIAGNOSIS — N189 Chronic kidney disease, unspecified: Secondary | ICD-10-CM | POA: Diagnosis not present

## 2018-07-20 DIAGNOSIS — H8110 Benign paroxysmal vertigo, unspecified ear: Secondary | ICD-10-CM | POA: Diagnosis not present

## 2018-07-20 DIAGNOSIS — T8452XA Infection and inflammatory reaction due to internal left hip prosthesis, initial encounter: Secondary | ICD-10-CM | POA: Diagnosis not present

## 2018-07-20 DIAGNOSIS — T8452XD Infection and inflammatory reaction due to internal left hip prosthesis, subsequent encounter: Secondary | ICD-10-CM | POA: Diagnosis not present

## 2018-07-20 DIAGNOSIS — T84031D Mechanical loosening of internal left hip prosthetic joint, subsequent encounter: Secondary | ICD-10-CM | POA: Diagnosis not present

## 2018-07-21 ENCOUNTER — Emergency Department (HOSPITAL_COMMUNITY): Payer: Medicare Other

## 2018-07-21 ENCOUNTER — Other Ambulatory Visit: Payer: Self-pay

## 2018-07-21 ENCOUNTER — Encounter (HOSPITAL_COMMUNITY): Payer: Self-pay

## 2018-07-21 ENCOUNTER — Inpatient Hospital Stay (HOSPITAL_COMMUNITY)
Admission: EM | Admit: 2018-07-21 | Discharge: 2018-07-25 | DRG: 560 | Disposition: A | Discharge status: Home Health | Payer: Medicare Other | Attending: Orthopaedic Surgery | Admitting: Orthopaedic Surgery

## 2018-07-21 DIAGNOSIS — Z807 Family history of other malignant neoplasms of lymphoid, hematopoietic and related tissues: Secondary | ICD-10-CM

## 2018-07-21 DIAGNOSIS — K219 Gastro-esophageal reflux disease without esophagitis: Secondary | ICD-10-CM | POA: Diagnosis present

## 2018-07-21 DIAGNOSIS — Z79890 Hormone replacement therapy (postmenopausal): Secondary | ICD-10-CM | POA: Diagnosis not present

## 2018-07-21 DIAGNOSIS — R29898 Other symptoms and signs involving the musculoskeletal system: Secondary | ICD-10-CM | POA: Diagnosis not present

## 2018-07-21 DIAGNOSIS — Z9889 Other specified postprocedural states: Secondary | ICD-10-CM | POA: Diagnosis not present

## 2018-07-21 DIAGNOSIS — Y831 Surgical operation with implant of artificial internal device as the cause of abnormal reaction of the patient, or of later complication, without mention of misadventure at the time of the procedure: Secondary | ICD-10-CM | POA: Diagnosis present

## 2018-07-21 DIAGNOSIS — Z96641 Presence of right artificial hip joint: Secondary | ICD-10-CM | POA: Diagnosis not present

## 2018-07-21 DIAGNOSIS — D509 Iron deficiency anemia, unspecified: Secondary | ICD-10-CM | POA: Diagnosis present

## 2018-07-21 DIAGNOSIS — D649 Anemia, unspecified: Secondary | ICD-10-CM | POA: Diagnosis not present

## 2018-07-21 DIAGNOSIS — E46 Unspecified protein-calorie malnutrition: Secondary | ICD-10-CM | POA: Diagnosis present

## 2018-07-21 DIAGNOSIS — R079 Chest pain, unspecified: Secondary | ICD-10-CM | POA: Diagnosis not present

## 2018-07-21 DIAGNOSIS — Z9842 Cataract extraction status, left eye: Secondary | ICD-10-CM | POA: Diagnosis not present

## 2018-07-21 DIAGNOSIS — M1990 Unspecified osteoarthritis, unspecified site: Secondary | ICD-10-CM | POA: Diagnosis not present

## 2018-07-21 DIAGNOSIS — M5136 Other intervertebral disc degeneration, lumbar region: Secondary | ICD-10-CM | POA: Diagnosis present

## 2018-07-21 DIAGNOSIS — M810 Age-related osteoporosis without current pathological fracture: Secondary | ICD-10-CM | POA: Diagnosis present

## 2018-07-21 DIAGNOSIS — Z981 Arthrodesis status: Secondary | ICD-10-CM | POA: Diagnosis not present

## 2018-07-21 DIAGNOSIS — L7632 Postprocedural hematoma of skin and subcutaneous tissue following other procedure: Secondary | ICD-10-CM | POA: Diagnosis present

## 2018-07-21 DIAGNOSIS — B964 Proteus (mirabilis) (morganii) as the cause of diseases classified elsewhere: Secondary | ICD-10-CM | POA: Diagnosis present

## 2018-07-21 DIAGNOSIS — R7303 Prediabetes: Secondary | ICD-10-CM | POA: Diagnosis present

## 2018-07-21 DIAGNOSIS — Z888 Allergy status to other drugs, medicaments and biological substances status: Secondary | ICD-10-CM | POA: Diagnosis not present

## 2018-07-21 DIAGNOSIS — M8580 Other specified disorders of bone density and structure, unspecified site: Secondary | ICD-10-CM | POA: Diagnosis not present

## 2018-07-21 DIAGNOSIS — Z96643 Presence of artificial hip joint, bilateral: Secondary | ICD-10-CM | POA: Diagnosis present

## 2018-07-21 DIAGNOSIS — T8452XA Infection and inflammatory reaction due to internal left hip prosthesis, initial encounter: Secondary | ICD-10-CM | POA: Diagnosis present

## 2018-07-21 DIAGNOSIS — J9811 Atelectasis: Secondary | ICD-10-CM | POA: Diagnosis not present

## 2018-07-21 DIAGNOSIS — G43909 Migraine, unspecified, not intractable, without status migrainosus: Secondary | ICD-10-CM | POA: Diagnosis present

## 2018-07-21 DIAGNOSIS — Z882 Allergy status to sulfonamides status: Secondary | ICD-10-CM | POA: Diagnosis not present

## 2018-07-21 DIAGNOSIS — R262 Difficulty in walking, not elsewhere classified: Secondary | ICD-10-CM

## 2018-07-21 DIAGNOSIS — Z6832 Body mass index (BMI) 32.0-32.9, adult: Secondary | ICD-10-CM

## 2018-07-21 DIAGNOSIS — E785 Hyperlipidemia, unspecified: Secondary | ICD-10-CM | POA: Diagnosis present

## 2018-07-21 DIAGNOSIS — T8189XA Other complications of procedures, not elsewhere classified, initial encounter: Secondary | ICD-10-CM | POA: Diagnosis not present

## 2018-07-21 DIAGNOSIS — Z961 Presence of intraocular lens: Secondary | ICD-10-CM | POA: Diagnosis present

## 2018-07-21 DIAGNOSIS — K760 Fatty (change of) liver, not elsewhere classified: Secondary | ICD-10-CM | POA: Diagnosis present

## 2018-07-21 DIAGNOSIS — R2689 Other abnormalities of gait and mobility: Secondary | ICD-10-CM | POA: Diagnosis not present

## 2018-07-21 DIAGNOSIS — L259 Unspecified contact dermatitis, unspecified cause: Secondary | ICD-10-CM | POA: Diagnosis not present

## 2018-07-21 DIAGNOSIS — R000 Tachycardia, unspecified: Secondary | ICD-10-CM | POA: Diagnosis not present

## 2018-07-21 DIAGNOSIS — Y838 Other surgical procedures as the cause of abnormal reaction of the patient, or of later complication, without mention of misadventure at the time of the procedure: Secondary | ICD-10-CM | POA: Diagnosis present

## 2018-07-21 DIAGNOSIS — A498 Other bacterial infections of unspecified site: Secondary | ICD-10-CM

## 2018-07-21 DIAGNOSIS — E039 Hypothyroidism, unspecified: Secondary | ICD-10-CM | POA: Diagnosis present

## 2018-07-21 DIAGNOSIS — Z7982 Long term (current) use of aspirin: Secondary | ICD-10-CM | POA: Diagnosis not present

## 2018-07-21 DIAGNOSIS — Z471 Aftercare following joint replacement surgery: Secondary | ICD-10-CM | POA: Diagnosis not present

## 2018-07-21 DIAGNOSIS — M549 Dorsalgia, unspecified: Secondary | ICD-10-CM | POA: Diagnosis not present

## 2018-07-21 DIAGNOSIS — M25552 Pain in left hip: Secondary | ICD-10-CM | POA: Diagnosis not present

## 2018-07-21 DIAGNOSIS — Z9841 Cataract extraction status, right eye: Secondary | ICD-10-CM

## 2018-07-21 LAB — URINALYSIS, ROUTINE W REFLEX MICROSCOPIC
Bacteria, UA: NONE SEEN
Bilirubin Urine: NEGATIVE
Glucose, UA: NEGATIVE mg/dL
Hgb urine dipstick: NEGATIVE
Ketones, ur: NEGATIVE mg/dL
Nitrite: NEGATIVE
Protein, ur: NEGATIVE mg/dL
Specific Gravity, Urine: 1.009 (ref 1.005–1.030)
pH: 6 (ref 5.0–8.0)

## 2018-07-21 LAB — CBC WITH DIFFERENTIAL/PLATELET
Abs Immature Granulocytes: 0.03 10*3/uL (ref 0.00–0.07)
BASOS ABS: 0 10*3/uL (ref 0.0–0.1)
Basophils Relative: 0 %
Eosinophils Absolute: 0.2 10*3/uL (ref 0.0–0.5)
Eosinophils Relative: 2 %
HCT: 25.9 % — ABNORMAL LOW (ref 36.0–46.0)
Hemoglobin: 7.7 g/dL — ABNORMAL LOW (ref 12.0–15.0)
IMMATURE GRANULOCYTES: 0 %
Lymphocytes Relative: 16 %
Lymphs Abs: 1.6 10*3/uL (ref 0.7–4.0)
MCH: 24.8 pg — ABNORMAL LOW (ref 26.0–34.0)
MCHC: 29.7 g/dL — ABNORMAL LOW (ref 30.0–36.0)
MCV: 83.5 fL (ref 80.0–100.0)
Monocytes Absolute: 0.6 10*3/uL (ref 0.1–1.0)
Monocytes Relative: 6 %
NRBC: 0 % (ref 0.0–0.2)
Neutro Abs: 7.5 10*3/uL (ref 1.7–7.7)
Neutrophils Relative %: 76 %
PLATELETS: 352 10*3/uL (ref 150–400)
RBC: 3.1 MIL/uL — ABNORMAL LOW (ref 3.87–5.11)
RDW: 16.5 % — ABNORMAL HIGH (ref 11.5–15.5)
WBC: 10 10*3/uL (ref 4.0–10.5)

## 2018-07-21 LAB — COMPREHENSIVE METABOLIC PANEL
ALT: 35 U/L (ref 0–44)
AST: 46 U/L — ABNORMAL HIGH (ref 15–41)
Albumin: 2.3 g/dL — ABNORMAL LOW (ref 3.5–5.0)
Alkaline Phosphatase: 201 U/L — ABNORMAL HIGH (ref 38–126)
Anion gap: 12 (ref 5–15)
BUN: 12 mg/dL (ref 8–23)
CO2: 20 mmol/L — AB (ref 22–32)
Calcium: 9.2 mg/dL (ref 8.9–10.3)
Chloride: 105 mmol/L (ref 98–111)
Creatinine, Ser: 0.61 mg/dL (ref 0.44–1.00)
GFR calc Af Amer: >60 mL/min (ref >60)
GFR calc non Af Amer: >60 mL/min (ref >60)
Glucose, Bld: 180 mg/dL — ABNORMAL HIGH (ref 70–99)
Potassium: 3.2 mmol/L — ABNORMAL LOW (ref 3.5–5.1)
SODIUM: 137 mmol/L (ref 135–145)
Total Bilirubin: 0.3 mg/dL (ref 0.3–1.2)
Total Protein: 6.4 g/dL — ABNORMAL LOW (ref 6.5–8.1)

## 2018-07-21 LAB — I-STAT CG4 LACTIC ACID, ED: Lactic Acid, Venous: 1.81 mmol/L (ref 0.5–1.9)

## 2018-07-21 LAB — I-STAT TROPONIN, ED: Troponin i, poc: 0.01 ng/mL (ref 0.00–0.08)

## 2018-07-21 LAB — C-REACTIVE PROTEIN: CRP: 14.6 mg/dL — ABNORMAL HIGH (ref <1.0)

## 2018-07-21 LAB — PREPARE RBC (CROSSMATCH)

## 2018-07-21 LAB — SEDIMENTATION RATE: Sed Rate: 94 mm/hr — ABNORMAL HIGH (ref 0–22)

## 2018-07-21 LAB — LIPASE, BLOOD: Lipase: 61 U/L — ABNORMAL HIGH (ref 11–51)

## 2018-07-21 MED ORDER — ENSURE ENLIVE PO LIQD
237.0000 mL | Freq: Two times a day (BID) | ORAL | Status: DC
  Administered 2018-07-22 – 2018-07-23 (×4): 237 mL via ORAL

## 2018-07-21 MED ORDER — SODIUM CHLORIDE 0.9 % IV SOLN
2.0000 g | INTRAVENOUS | Status: DC
  Administered 2018-07-21 – 2018-07-24 (×4): 2 g via INTRAVENOUS
  Filled 2018-07-21 (×5): qty 20

## 2018-07-21 MED ORDER — DOCUSATE SODIUM 100 MG PO CAPS
100.0000 mg | ORAL_CAPSULE | Freq: Two times a day (BID) | ORAL | Status: DC
  Administered 2018-07-21 – 2018-07-25 (×8): 100 mg via ORAL
  Filled 2018-07-21 (×8): qty 1

## 2018-07-21 MED ORDER — HYDROCODONE-ACETAMINOPHEN 5-325 MG PO TABS
1.0000 | ORAL_TABLET | ORAL | Status: DC | PRN
  Administered 2018-07-21 – 2018-07-22 (×2): 2 via ORAL
  Filled 2018-07-21 (×2): qty 2

## 2018-07-21 MED ORDER — SODIUM CHLORIDE 0.9% FLUSH: 10.0000 mL | INTRAVENOUS | Status: DC | PRN

## 2018-07-21 MED ORDER — SODIUM CHLORIDE 0.9 % IV SOLN
Freq: Once | INTRAVENOUS | Status: AC
  Administered 2018-07-21: 11:00:00 via INTRAVENOUS

## 2018-07-21 MED ORDER — MORPHINE SULFATE (PF) 4 MG/ML IV SOLN
4.0000 mg | Freq: Once | INTRAVENOUS | Status: AC
  Administered 2018-07-21: 4 mg via INTRAVENOUS
  Filled 2018-07-21: qty 1

## 2018-07-21 MED ORDER — SODIUM CHLORIDE 0.9 % IV SOLN
INTRAVENOUS | Status: DC
  Administered 2018-07-21 – 2018-07-25 (×2): via INTRAVENOUS

## 2018-07-21 MED ORDER — SODIUM CHLORIDE 0.9% IV SOLUTION
Freq: Once | INTRAVENOUS | Status: AC
  Administered 2018-07-21: 23:00:00 via INTRAVENOUS

## 2018-07-21 NOTE — H&P (Signed)
Emma Lewis is an 67 y.o. female.   Chief Complaint: weakness with anemia and post periprothetic femur fracture infection.  HPI: 67 year old female with recent ER visit 07/18/2018 and again today for weakness and inability to ambulate to her bedside commode.  She had urinated on the floor and today was not able to get up and make it out of bed after multiple attempts to the bedside commode.  Patient had an original left total hip arthroplasty by Dr. Eliezer Lofts at [facility] in 2011.  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  She had had an acetabular revision by Dr. Lyla Glassing in November 2017 for a broken polyethylene liner with metal debris.  3 years later she had a trochanteric injection for bursitis and black metal debris drained with the source being the femoral stem that had not been revised which was a ProFemur stem that had been discontinued and recalled due to the problems between the neck and stem.  She underwent revision with the long trochanteric osteotomy removing the stem with extensive metallosis found and was fixed with cables on 04/29/2018.  She had had a right anterior total hip arthroplasty done 4 months prior to that anterior approach and right hip is been doing well with no problems.  After her hip revision surgery she was transferred to a nursing home was having problems practicing transferring into a car caught her foot had some increased hip pain and had been able to walk.  Significant pain and had a hip subluxation with periprosthetic femur fracture that extended down to the tip of the stem.  She underwent revision surgery on 06/01/2018 with bone grafting long femoral plate that extended almost down to the knees with fixation with multiple screws distally and multiple cables proximally.  Screws were placed proximally into the greater trochanter.  Postoperatively she went home and then returned to the office  and was admitted to the hospital for irrigation and debridement of left hip hematoma with increased wound drainage on 06/19/2018.  At the time of hematoma evacuation no purulence was found and cultures grew out Proteus mirabilis sensitive to all antibiotics tested.  Initially she was treated with Vanco and Zosyn and was seen by infectious disease team who switched her to Rocephin 2 g IV every 24 hours via PICC line.  A VAC was placed as well as antibiotic beads that were bioabsorbable the had vancomycin and also tobramycin.  Distal and proximal areas of her wounds have healed in the midportion of her wound is continued to have serous drainage gradually decreasing with a small amount of blood.  She was discharged home in a been ambulating to the recliner into the bedside toilet working on leg lifts and knee range of motion and follow-up x-rays that showed good position alignment of her hip plate screws and cables.  No instability problems with her hip.  Patient's had previous anterior cervical fusion which is healed well and has continued to have some problems with her shoulders making it difficult for her to transfer.  She has been using her walker.  Sedimentation rate is Selinda Eon remained elevated at 14.6 sedimentation rate 94 on 07/21/2018.  1 month ago sed rate was 122.  She has been on protein supplements Ensure, protein bars and albumin is decreased from 2.40-monthago to 2.3 today.  She has had blood work done weekly by home health and has remained anemic with hemoglobin 7.361-month  ago increasing 4 weeks ago to 8.1 and slowly decrease down to 7.7.  White count is stayed stable between 9.8 and now 10.0 with normal differential.  Erythema around her wound at the time of hematoma has decreased and dressing changes were being done daily and now up have been every 2 to 3 days recently with less drainage.  Last hip x-rays on 07/18/2018 were unchanged with interval healing of the femur fracture and no evidence of  hardware failure of the long plate.  Patient has known history of osteoporosis and has had some thoracic spine compression fractures as well as 3 lumbar compression fractures with vertebroplasty done in the past 2 to 3 years ago.  MRI scan performed today lumbar showed no acute fractures.  Healed previous augmented fractures L3, L4 and L5.  Lower degenerative changes but no compressive stenosis.  Thoracic MRI scan today showed old healed fractures of T6, T11 and T12.  Small amount of pleural fluid possible left lower lobe atelectasis.  She has been partial weightbearing on the left lower extremity full weightbearing on the right lower extremity.  Pain medication has been weaned from [place] down to [place] 2 to 3 tablets a day total.  She is continued to have ongoing pain and has had difficulty weaning off the pain medication.  Past Medical History:  Diagnosis Date  . Arthritis    "all over my body" (04/29/2018)  . Chronic lower back pain   . Fatty liver   . Fever blister    Takes Acyclovir  . GERD (gastroesophageal reflux disease)   . Hyperlipemia   . Hypothyroidism   . Migraines   . Osteopenia   . Pneumonia    "once; years ago" (04/29/2018)  . PONV (postoperative nausea and vomiting)   . Pre-diabetes   . Seasonal allergies   . Vertigo     Past Surgical History:  Procedure Laterality Date  . ANTERIOR CERVICAL DECOMP/DISCECTOMY FUSION N/A 02/09/2014   Procedure: ANTERIOR CERVICAL DECOMPRESSION/DISCECTOMY FUSION 2 LEVELS;  Surgeon: [name], MD;  Location: [facility];  Service: Orthopedics;  Laterality: N/A;  C4-5, C5-6 Anterior Cervical Discectomy and Fusion, Allograft, Plate  . BACK SURGERY    . CARPAL TUNNEL RELEASE Right 2018  . CATARACT EXTRACTION W/ INTRAOCULAR LENS  IMPLANT, BILATERAL    . COLONOSCOPY    . FIXATION KYPHOPLASTY LUMBAR SPINE  X 2  . HIP ARTHROPLASTY Left 2011  . INCISION AND DRAINAGE HIP Left 06/19/2018   Procedure: IRRIGATION AND DEBRIDEMENT LEFT HIP  HEMATOMA ANTIBIOTIC BEADS, APPLICATION ON WOUND VAC;  Surgeon: [name], MD;  Location: [place];  Service: Orthopedics;  Laterality: Left;  . JOINT REPLACEMENT    . KNEE ARTHROSCOPY Right   . ORIF PERIPROSTHETIC FRACTURE Left 06/01/2018   Procedure: OPEN REDUCTION INTERNAL FIXATION (ORIF) PERIPROSTHETIC FEMUR FRACTURE;  Surgeon: [name], MD;  Location: [place];  Service: Orthopedics;  Laterality: Left;  . PATELLA FRACTURE SURGERY Right ~ 1990   "put metal in"  . PATELLA HARDWARE REMOVAL Right    "took the metal out"  . REVISION TOTAL HIP ARTHROPLASTY Left 04/29/2018  . ROBOTIC ASSISTED BILATERAL SALPINGO OOPHERECTOMY Bilateral 08/01/2015   Procedure: ROBOTIC ASSISTED BILATERAL SALPINGO OOPHORECTOMY;  Surgeon: Everitt Amber, MD;  Location: [facility];  Service: Gynecology;  Laterality: Bilateral;  . SHOULDER OPEN ROTATOR CUFF REPAIR Right    3 TOTAL  . SHOULDER SURGERY Right   . TOTAL HIP ARTHROPLASTY Right 01/21/2018   Procedure: RIGHT TOTAL HIP  ARTHROPLASTY DIRECT ANTERIOR;  Surgeon: [name], MD;  Location: [place];  Service: Orthopedics;  Laterality: Right;  . TOTAL HIP REVISION Left 06/10/2016   Procedure: TOTAL HIP REVISION ARTHROPLASTY;  Surgeon: Rod Can, MD;  Location: [place];  Service: Orthopedics;  Laterality: Left;  . TOTAL HIP REVISION Left 04/29/2018   Procedure: left total hip arthroplasty revision posterior approach;  Surgeon: [name], MD;  Location: [place];  Service: Orthopedics;  Laterality: Left;  . WRIST GANGLION EXCISION Left 1970s    Family History  Problem Relation Age of Onset  . Lymphoma Mother    Social History:  reports that she has never smoked. She has never used smokeless tobacco. She reports current alcohol use. She reports that she does not use drugs.  Allergies:  Allergies  Allergen Reactions  . Sulfa Antibiotics Other (See Comments)     Childhood allergy Mother said "I liked to have died"  . Fenofibrate Diarrhea    Reaction while taking  with magnesium    Medications Prior to Admission  Medication Sig Dispense Refill  . acyclovir (ZOVIRAX) 400 MG tablet Take 1 tablet (400 mg total) by mouth 2 (two) times daily. (Patient taking differently: Take 400 mg by mouth See admin instructions. Take one tablet (400 mg) by mouth daily, may increase to one tablet (400 mg) twice daily as needed for fever blisters.) 60 tablet 0  . aspirin EC 325 MG EC tablet Take 1 tablet (325 mg total) by mouth daily with breakfast. 30 tablet 0  . Calcium Carb-Cholecalciferol (CALCIUM 600 + D PO) Take 1 tablet by mouth 3 (three) times daily.     . Carboxymethylcellulose Sodium (THERATEARS) 0.25 % SOLN Place 1 drop into both eyes 2 (two) times daily as needed (dry eyes).    . cefTRIAXone (ROCEPHIN) IVPB Inject 2 g into the vein daily. Indication:  Deep tissue infection L hip Last Day of Therapy:  08/01/18 Labs - Once weekly:  CBC/D and BMP, Labs - Every other week:  ESR and CRP 40 Units 0  . Cholecalciferol (VITAMIN D3) 2000 units TABS Take 2,000 Units by mouth 2 (two) times daily.     . clobetasol cream (TEMOVATE) 7.50 % Apply 1 application topically 2 (two) times daily. (Patient taking differently: Apply 1 application topically daily as needed (irritation). ) 30 g 1  . HYDROcodone-acetaminophen (NORCO) 5-325 MG tablet Take 1-2 tablets by mouth every 6 (six) hours as needed for moderate pain. 30 tablet 0  . ibuprofen (ADVIL,MOTRIN) 200 MG tablet Take 800 mg by mouth every 6 (six) hours as needed for mild pain.    Marland Kitchen levothyroxine (SYNTHROID, LEVOTHROID) 100 MCG tablet Take 1 tablet (100 mcg total) by mouth daily before breakfast. 30 tablet 0  . meclizine (ANTIVERT) 25 MG tablet Take 1 tablet (25 mg total) by mouth 3 (three) times daily as needed for dizziness. Vertigo. Also available OTC (Patient taking differently: Take 25 mg by mouth daily as needed (vertigo). ) 90 tablet 3  . methocarbamol (ROBAXIN) 500 MG tablet Take 1 tablet (500 mg total) by mouth every 8  (eight) hours as needed for muscle spasms. 40 tablet 0  . nystatin-triamcinolone (MYCOLOG II) cream Apply 1 application topically 2 (two) times daily as needed (irritation).   3  . Omega-3 Fatty Acids (OMEGA-3 FISH OIL PO) Take 1 capsule by mouth 3 (three) times daily.     Marland Kitchen omeprazole (PRILOSEC OTC) 20 MG tablet Take 10 mg by mouth daily.    Marland Kitchen  simvastatin (ZOCOR) 40 MG tablet Take 1 tablet (40 mg total) by mouth at bedtime. 30 tablet 0  . Specialty Vitamins Products (BIOTIN PLUS KERATIN) 10000-100 MCG-MG TABS Take 1 tablet by mouth 3 (three) times daily.     . feeding supplement, ENSURE ENLIVE, (ENSURE ENLIVE) LIQD Take 237 mLs by mouth 2 (two) times daily between meals. 237 mL 12  . methocarbamol (ROBAXIN) 500 MG tablet Take 1 tablet (500 mg total) by mouth every 6 (six) hours as needed for muscle spasms. (Patient not taking: Reported on 07/21/2018) 60 tablet 0    Results for orders placed or performed during the hospital encounter of 07/21/18 (from the past 48 hour(s))  Comprehensive metabolic panel     Status: Abnormal   Collection Time: 07/21/18 10:00 AM  Result Value Ref Range   Sodium 137 135 - 145 mmol/L   Potassium 3.2 (L) 3.5 - 5.1 mmol/L   Chloride 105 98 - 111 mmol/L   CO2 20 (L) 22 - 32 mmol/L   Glucose, Bld 180 (H) 70 - 99 mg/dL   BUN 12 8 - 23 mg/dL   Creatinine, Ser 0.61 0.44 - 1.00 mg/dL   Calcium 9.2 8.9 - 10.3 mg/dL   Total Protein 6.4 (L) 6.5 - 8.1 g/dL   Albumin 2.3 (L) 3.5 - 5.0 g/dL   AST 46 (H) 15 - 41 U/L   ALT 35 0 - 44 U/L   Alkaline Phosphatase 201 (H) 38 - 126 U/L   Total Bilirubin 0.3 0.3 - 1.2 mg/dL   GFR calc non Af Amer >60 >60 mL/min   GFR calc Af Amer >60 >60 mL/min   Anion gap 12 5 - 15    Comment: Performed at [facility], [address] [place], [place] [address]  Lipase, blood     Status: Abnormal   Collection Time: 07/21/18 10:00 AM  Result Value Ref Range   Lipase 61 (H) 11 - 51 U/L    Comment: Performed at [facility] [address], [place], [place] [address]  CBC with Differential     Status: Abnormal   Collection Time: 07/21/18 10:00 AM  Result Value Ref Range   WBC 10.0 4.0 - 10.5 K/uL   RBC 3.10 (L) 3.87 - 5.11 MIL/uL   Hemoglobin 7.7 (L) 12.0 - 15.0 g/dL   HCT 25.9 (L) 36.0 - 46.0 %   MCV 83.5 80.0 - 100.0 fL   MCH 24.8 (L) 26.0 - 34.0 pg   MCHC 29.7 (L) 30.0 - 36.0 g/dL   RDW 16.5 (H) 11.5 - 15.5 %   Platelets 352 150 - 400 K/uL   nRBC 0.0 0.0 - 0.2 %   Neutrophils Relative % 76 %   Neutro Abs 7.5 1.7 - 7.7 K/uL   Lymphocytes Relative 16 %   Lymphs Abs 1.6 0.7 - 4.0 K/uL   Monocytes Relative 6 %   Monocytes Absolute 0.6 0.1 - 1.0 K/uL   Eosinophils Relative 2 %   Eosinophils Absolute 0.2 0.0 - 0.5 K/uL   Basophils Relative 0 %   Basophils Absolute 0.0 0.0 - 0.1 K/uL   Immature Granulocytes 0 %   Abs Immature Granulocytes 0.03 0.00 - 0.07 K/uL    Comment: Performed at [facility], [place] [address], [place],  [address]  Sedimentation rate     Status: Abnormal   Collection Time: 07/21/18 10:00 AM  Result Value Ref Range   Sed Rate 94 (H) 0 -  22 mm/hr    Comment: Performed at [facility], [place] [address] [place], [place] [address]  C-reactive protein     Status: Abnormal   Collection Time: 07/21/18 10:00 AM  Result Value Ref Range   CRP 14.6 (H) <1.0 mg/dL    Comment: Performed at [facility] [address], [place], [place] [address]  Culture, blood (routine x 2)     Status: None (Preliminary result)   Collection Time: 07/21/18 10:01 AM  Result Value Ref Range   Specimen Description BLOOD LEFT ANTECUBITAL    Special Requests      BOTTLES DRAWN AEROBIC AND ANAEROBIC Blood Culture adequate volume   Culture      NO GROWTH < 12 HOURS Performed at [facility], [place] [address] [place], [place] [address]    Report Status PENDING   Culture, blood (routine x 2)     Status: None (Preliminary result)   Collection Time: 07/21/18 10:06 AM  Result Value Ref  Range   Specimen Description BLOOD LEFT HAND    Special Requests      BOTTLES DRAWN AEROBIC AND ANAEROBIC Blood Culture adequate volume   Culture      NO GROWTH < 12 HOURS Performed at [facility], [place] [address], [place], [place] [address]    Report Status PENDING   I-stat troponin, ED     Status: None   Collection Time: 07/21/18 10:38 AM  Result Value Ref Range   Troponin i, poc 0.01 0.00 - 0.08 ng/mL   Comment 3            Comment: Due to the release kinetics of cTnI, a negative result within the first hours of the onset of symptoms does not rule out myocardial infarction with certainty. If myocardial infarction is still suspected, repeat the test at appropriate intervals.   I-Stat CG4 Lactic Acid, ED     Status: None   Collection Time: 07/21/18 10:40 AM  Result Value Ref Range   Lactic Acid, Venous 1.81 0.5 - 1.9 mmol/L  Urinalysis, Routine w reflex microscopic     Status: Abnormal   Collection Time: 07/21/18  5:07 PM  Result Value Ref Range   Color, Urine STRAW (A) YELLOW   APPearance CLEAR CLEAR   Specific Gravity, Urine 1.009 1.005 - 1.030   pH 6.0 5.0 - 8.0   Glucose, UA NEGATIVE NEGATIVE mg/dL   Hgb urine dipstick NEGATIVE NEGATIVE   Bilirubin Urine NEGATIVE NEGATIVE   Ketones, ur NEGATIVE NEGATIVE mg/dL   Protein, ur NEGATIVE NEGATIVE mg/dL   Nitrite NEGATIVE NEGATIVE   Leukocytes, UA TRACE (A) NEGATIVE   RBC / HPF 0-5 0 - 5 RBC/hpf   WBC, UA 0-5 0 - 5 WBC/hpf   Bacteria, UA NONE SEEN NONE SEEN   Squamous Epithelial / LPF 0-5 0 - 5    Comment: Performed at [facility], [address], [place], [place] [address]  Type and screen [place]     Status: None (Preliminary result)   Collection Time: 07/21/18  8:15 PM  Result Value Ref Range   ABO/RH(D) PENDING    Antibody Screen PENDING    Sample Expiration      07/24/2018 Performed at [facility], [place] [address], [place], [place] [address]    Mr Thoracic Spine Wo  Contrast  Result Date: 07/21/2018 CLINICAL DATA:  Back pain and chest pain. EXAM: MRI THORACIC SPINE WITHOUT CONTRAST  TECHNIQUE: Multiplanar, multisequence MR imaging of the thoracic spine was performed. No intravenous contrast was administered. COMPARISON:  CT chest 07/18/2018. FINDINGS: Alignment: Normal except for slightly increased kyphosis in the midthoracic region. Vertebrae: Old healed compression fracture at T6 with loss of height anteriorly 30%. Old healed superior endplate deformities at T11-T12. Evidence recent fracture. Cord:  Normal Paraspinal and other soft tissues: Normal except for very small pleural effusions. Possible of lower lobe atelectasis and or pneumonia. Disc levels: T1-2: Disc bulge.  No stenosis. T2-3 through T4-5: Normal. T5-6: Small central disc protrusion. Mild ligamentous hypertrophy posteriorly. No compressive stenosis. T6-7: Endplate osteophytes and bulging of the disc. Slight narrowing of the ventral subarachnoid space no canal or foraminal stenosis. T7-8 through T9-10: Normal. T10-11: Minimal disc bulge.  No stenosis. T11-12: Mild disc bulge. Mild ligamentous hypertrophy. No stenosis. T12-L1 mild disc bulge.  Mild ligamentous hypertrophy.  No stenosis IMPRESSION: No evidence of recent fracture in the thoracic region. Old healed fractures at T6, T11 and T12. No likely significant degenerative change. No compressive narrowing of the canal or. Small amount of pleural fluid. Possible left lower lobe atelectasis and or pneumonia. Electronically Signed   By: Nelson Chimes M.D.   On: 07/21/2018 13:54   Mr Lumbar Spine Wo Contrast  Result Date: 07/21/2018 CLINICAL DATA:  Acute presentation with back pain and chest pain EXAM: MRI LUMBAR SPINE WITHOUT CONTRAST TECHNIQUE: Multiplanar, multisequence MR imaging of the lumbar spine was performed. No intravenous contrast was administered. COMPARISON:  03/20/2017 FINDINGS: Segmentation: 5 lumbar type vertebral bodies as numbered previously.  Alignment:  Normal Vertebrae: Old healed superior endplate fracture at T24. Old previously augmented healed fractures at L3, L4 and L5 without evidence of progression. Conus medullaris and cauda equina: Conus extends to the T12 level. Conus and cauda equina appear normal. Paraspinal and other soft tissues: Negative Disc levels: Mild non-compressive disc bulges at T11-12 and T12-L1. L1-2 is normal. L2-3: Disc bulge. Mild facet and ligamentous hypertrophy. Mild stenosis of both lateral recesses but without definite neural compression. L3-4: Disc bulge. Mild facet and ligamentous hypertrophy. Mild narrowing of both lateral recesses but without definite neural compression. L4-5: Disc bulge. Mild facet ligamentous hypertrophy. Minimal lateral recess narrowing without neural compression. L5-S1: Bulging of the disc more prominent towards the left. Mild facet hypertrophy. Mild narrowing of the subarticular lateral recesses but without neural compression. Foramina sufficiently patent. IMPRESSION: No acute fracture in the region. Old healed previously augmented fractures at L3, L4 and L5. Lower lumbar degenerative disc disease and degenerative facet disease. Mild narrowing of the lateral recesses but no apparent compressive stenosis. Compared to the study of 2018, there is no significant change. Electronically Signed   By: Nelson Chimes M.D.   On: 07/21/2018 13:50   Dg Chest Port 1 View  Result Date: 07/21/2018 CLINICAL DATA:  Weakness.  Chest pain.  History of pneumonia. EXAM: PORTABLE CHEST 1 VIEW COMPARISON:  Two-view chest x-ray 07/18/2018. CT of the chest 07/18/2018. FINDINGS: Left base atelectasis and scarring is again seen. Aeration is slightly improved. Lung volumes remain low. No other airspace disease is present. Heart size is exaggerated. There is no edema. IMPRESSION: 1. Improving airspace disease and atelectasis at the left base. 2. Chronic curvilinear scarring at the left base. Electronically Signed   By:  San Morelle M.D.   On: 07/21/2018 14:19    Review of Systems  Constitutional: Positive for malaise/fatigue. Negative for chills, diaphoresis and fever.  HENT: Negative for congestion, ear discharge, ear pain  and hearing loss.   Eyes: Negative for blurred vision and double vision.  Respiratory: Positive for hemoptysis. Negative for cough and shortness of breath.   Cardiovascular:       Occasional chest pain with mobility pain at PICC line  Gastrointestinal: Positive for heartburn. Negative for constipation and diarrhea.  Genitourinary: Positive for frequency and urgency. Negative for dysuria.       Urinated on floor unable to make it to bedside commode.   Musculoskeletal: Positive for back pain and joint pain. Negative for myalgias and neck pain.  Neurological: Positive for weakness. Negative for dizziness, seizures and loss of consciousness.  Endo/Heme/Allergies: Negative for polydipsia.  Psychiatric/Behavioral: Negative for hallucinations, substance abuse and suicidal ideas.   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  With 2 revision procedures.  Periprosthetic femur fracture at the tip with long plating.  Hypoalbuminemia with protein calorie malnourishment.  Previous cervical fusion.  Previous right total hip arthroplasty.  Weakness with inability to ambulate worse in the last week.  Anemia.  Left hip infected hematoma with Proteus mirabilis sensitive to all antibiotics.  History of fatty liver, hypertension, hypothyroidism.  She has had problems with chest pain when she gets up which she relates to the PICC line catheter if she moves her arm in a different way she has less pain but still has problems getting up and ambulating which is gotten worse in the last week preventing transfers.  She is at urgency and urinated on the floor without dysuria but with frequency.  Otherwise negative is a pertains to HPI.  Blood pressure (!) 113/54, pulse 89, temperature  98.8 F (37.1 C), temperature source Oral, resp. rate 16, height 5' 5"  (1.651 m), weight 88.5 kg, SpO2 97 %. Physical Exam  Constitutional: She appears well-developed.  HENT:  Head: Normocephalic and atraumatic.  Eyes: Pupils are equal, round, and reactive to light. Conjunctivae are normal.  Neck: Normal range of motion. No tracheal deviation present. No thyromegaly present.  Cardiovascular: Normal rate and regular rhythm.  Respiratory: Effort normal. She has no wheezes. She has no rales.  GI: Soft. Bowel sounds are normal. She exhibits no distension. There is no abdominal tenderness.  Musculoskeletal:     Comments: Left hip healed incision prox and distal. Mid portion has sutures still in and tiny spot on dressing from yesterday application. Trace erythema better than 2 wks ago.   Skin: Rash noted. There is erythema. No pallor.  Psychiatric: She has a normal mood and affect. Thought content normal.     Assessment/Plan Infected left hip hematoma after 2 hip revision surgeries with periprosthetic femur fracture long femoral plate now with PICC line on Rocephin until January 6.  Chronic anemia with chronic malnourishment preventing proper wound healing. Plan will be to transfuse 2 units of blood have physical therapy work with her.  Dietary consultation once again for improved nutrition.  Urine culture is been sent results pending.  Will consider possible rehab consultation if she does not make progress over the next few days after transfusion. Left base atelectasis.   Push PO intake especially protein. Wean pain meds. See if she does better with PT post transfusion. Likely not able to go back home. SNf likely rehab would not take her.  [name], MD 07/21/2018, 9:25 PM

## 2018-07-21 NOTE — Progress Notes (Cosign Needed)
Subjective: Dr. Andy Gauss and I visited Emma Lewis in the emergency department.  She states she has had low energy the last few days and that every time she stands she feels like her right lower leg is "mush".  Her husband states that they did not sleep very well last night he feels like he cannot have another night like that again because she was moaning from pain. She reports she was recently in the emergency department on Saturday for chest pain which she contributes to musculoskeletal pain from having to hold onto her walker into the PICC line in her right arm. Patient also reports occasional sharp and burning pain in her hip at the incision site that is worsened by motions that fold or move the incision.  Objective: Blood pressure (!) 107/52, pulse 85, temperature 98.4 F (36.9 C), temperature source Oral, resp. rate 17, height 5\' 5"  (1.651 m), weight 88.5 kg, SpO2 95 %.  Physical Exam Constitutional:      Appearance: She is obese. She is not toxic-appearing.  Cardiovascular:     Rate and Rhythm: Normal rate and regular rhythm.     Pulses: Normal pulses.     Heart sounds: Normal heart sounds.  Pulmonary:     Effort: Pulmonary effort is normal.     Breath sounds: Normal breath sounds.  Abdominal:     General: Bowel sounds are normal.  Musculoskeletal:        General: No swelling or deformity.     Right lower leg: No edema.     Left lower leg: No edema.  Neurological:     General: No focal deficit present.     Mental Status: She is oriented to person, place, and time.     Motor: No weakness.     Coordination: Coordination normal.   Imaging: Mr Thoracic Spine Wo Contrast: 07/21/2018 No evidence of recent fracture in the thoracic region. Old healed fractures at T6, T11 and T12. No likely significant degenerative change. No compressive narrowing of the canal or. Small amount of pleural fluid. Possible left lower lobe atelectasis and or pneumonia.  Mr Lumbar Spine Wo Contrast:  07/21/2018 No acute fracture in the region. Old healed previously augmented fractures at L3, L4 and L5. Lower lumbar degenerative disc disease and degenerative facet disease. Mild narrowing of the lateral recesses but no apparent compressive stenosis. Compared to the study of 2018, there is no significant change.   Dg Chest Port 1 View: 07/21/2018  1. Improving airspace disease and atelectasis at the left base. 2. Chronic curvilinear scarring at the left base. Electronically Signed   By: San Morelle M.D.   On: 07/21/2018 14:19   Assessment: Emma Lewis is a 67 year old female with weakness and left hip pain secondary to deconditioning and healing left hip status post surgery.  Plan: -Patient can return home with pain medications. -Patient has scheduled follow-up with Ortho on Friday, 07/24/2018 at 2:15pm. -She will complete her antibiotic course of 08/03/2018 and likely will be able to initiate physical therapy.  Milus Banister, [place], PGY-1 07/21/2018 4:56 PM

## 2018-07-21 NOTE — ED Notes (Signed)
Xray notified of pt's return to room for availability of portable imaging.

## 2018-07-21 NOTE — ED Triage Notes (Signed)
Patient arrives from ED with complaints of ongoing back pain and new chest pain. Pt reports being sent by Dr. Lorin Mercy to "have her back scanned and urine checked". Pt reports extensive hip surgeries, PICC line in place on right chest. Pt placed in position of comfort with call bell in reach, bed locked and lowered.

## 2018-07-21 NOTE — Progress Notes (Signed)
Discussed with Dr. Lonie Peak past Hx. Also notified Dr. Zollie Beckers who is on call for the practice and he will see her tomorrow if needed by hospitalist.  If MRI T-spine L-spine neg may need Tx for anemia with transfusion . Has gotten progressively weaker in last week and cannot make it to the bedside toilet. xrays of left femur are unchanged. She has some serous drainage from her hip that is decreasing and still on ABX via [facility] until Jan 6th.  Low Hgb, low albumin.    My cell [phone]

## 2018-07-21 NOTE — Progress Notes (Signed)
Patient ID: Emma Lewis, female   DOB: [birth year], 67 y.o.   MRN: [id number] Pt weak with bilateral increased leg weakness , problems getting to bedside commode . 2nd time to ER for failure to progress. Will admit and transfuse 2 units of packed cells. Lumbar and thoracic MRI show no cord compression.  Has had previous compression fractures unchanged lumbar from 2018.  Will admit and transfuse 2 units PRBCs since she is so weak she has not been able to get OOB .

## 2018-07-21 NOTE — ED Provider Notes (Signed)
[facility] EMERGENCY DEPARTMENT Provider Note   CSN: [id number] Arrival date & time: 07/21/18  0569     History   Chief Complaint Chief Complaint  Patient presents with  . Back Pain    HPI LYANN HAGSTROM is a 67 y.o. female.  HPI Patient sent to the emergency department by her treating orthopod Dr. Lorin Mercy.  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  There was complicated excision then subsequent hematoma that required evacuation and antibiotic seeding.  This has been transpiring over the past 2 months starting in October.  It has now been placed on a PICC line and is being closely monitored by Dr. Lorin Mercy.  This week, she is having increasing problems with pain and weakness.  She is getting to be so weak that she cannot stand and ambulate in her home to an edge ADLs.  She has developed diarrhea and increasing back pain.  She reports now she has quite a bit of pain in her central and lower back. Past Medical History:  Diagnosis Date  . Arthritis    "all over my body" (04/29/2018)  . Chronic lower back pain   . Fatty liver   . Fever blister    Takes Acyclovir  . GERD (gastroesophageal reflux disease)   . Hyperlipemia   . Hypothyroidism   . Migraines   . Osteopenia   . Pneumonia    "once; years ago" (04/29/2018)  . PONV (postoperative nausea and vomiting)   . Pre-diabetes   . Seasonal allergies   . Vertigo     Patient Active Problem List   Diagnosis Date Noted  . Ambulatory dysfunction 07/21/2018  . Proteus infection   . Hip hematoma, left 06/19/2018  . Hematoma of left hip 06/19/2018  . Hip dislocation, left ([place]) 05/29/2018  . Peri-prosthetic femur fracture at tip of prosthesis 05/29/2018  . Prediabetes   . Popping sound of knee joint   . Hypoalbuminemia due to protein-calorie malnutrition ([place])   . Transaminitis   . Leukocytosis   . Postoperative pain   . 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 Acute blood loss anemia   . Leukemoid reaction   . 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 Joint effusion of pelvis or thigh, left 04/22/2018  . Trochanteric bursitis, left hip 04/22/2018  . History of left hip replacement 11/12/2017  . Bilateral primary osteoarthritis of knee 11/12/2017  . GERD (gastroesophageal reflux disease) 01/17/2017  . Hypothyroidism 01/17/2017  . Vertigo 01/16/2017  . Nausea and vomiting 01/16/2017  . Essential hypertension 01/16/2017  . Fatty liver 01/16/2017  . Failed total hip arthroplasty ([place]) 06/10/2016  . Pelvic mass 06/15/2015  . Closed wedge compression fracture of fifth lumbar vertebra ([place])   . Cervical spondylosis 02/09/2014  . DDD (degenerative disc disease), lumbar 05/13/2013  . OA (osteoarthritis) 05/13/2013    Past Surgical History:  Procedure Laterality Date  . ANTERIOR CERVICAL DECOMP/DISCECTOMY FUSION N/A 02/09/2014   Procedure: ANTERIOR CERVICAL DECOMPRESSION/DISCECTOMY FUSION 2 LEVELS;  Surgeon: [name], MD;  Location: [facility];  Service: Orthopedics;  Laterality: N/A;  C4-5, C5-6 Anterior Cervical Discectomy and Fusion, Allograft, Plate  . BACK SURGERY    . CARPAL TUNNEL RELEASE Right 2018  . CATARACT EXTRACTION W/ INTRAOCULAR LENS  IMPLANT, BILATERAL    . COLONOSCOPY    . FIXATION KYPHOPLASTY LUMBAR SPINE  X 2  . HIP ARTHROPLASTY Left 2011  . INCISION AND DRAINAGE  HIP Left 06/19/2018   Procedure: IRRIGATION AND DEBRIDEMENT LEFT HIP HEMATOMA ANTIBIOTIC BEADS, APPLICATION ON WOUND VAC;  Surgeon: [name], MD;  Location: [facility];  Service: Orthopedics;  Laterality: Left;  . JOINT REPLACEMENT    . KNEE ARTHROSCOPY Right   . ORIF PERIPROSTHETIC FRACTURE Left 06/01/2018   Procedure: OPEN REDUCTION INTERNAL FIXATION (ORIF) PERIPROSTHETIC FEMUR FRACTURE;  Surgeon: [name], MD;  Location: [facility];  Service: Orthopedics;  Laterality: Left;  . PATELLA FRACTURE SURGERY Right ~ 1990   "put metal in"  . PATELLA  HARDWARE REMOVAL Right    "took the metal out"  . REVISION TOTAL HIP ARTHROPLASTY Left 04/29/2018  . ROBOTIC ASSISTED BILATERAL SALPINGO OOPHERECTOMY Bilateral 08/01/2015   Procedure: ROBOTIC ASSISTED BILATERAL SALPINGO OOPHORECTOMY;  Surgeon: Everitt Amber, MD;  Location: [facility];  Service: Gynecology;  Laterality: Bilateral;  . SHOULDER OPEN ROTATOR CUFF REPAIR Right    3 TOTAL  . SHOULDER SURGERY Right   . TOTAL HIP ARTHROPLASTY Right 01/21/2018   Procedure: RIGHT TOTAL HIP ARTHROPLASTY DIRECT ANTERIOR;  Surgeon: [name], MD;  Location: [facility];  Service: Orthopedics;  Laterality: Right;  . TOTAL HIP REVISION Left 06/10/2016   Procedure: TOTAL HIP REVISION ARTHROPLASTY;  Surgeon: Rod Can, MD;  Location: [place];  Service: Orthopedics;  Laterality: Left;  . TOTAL HIP REVISION Left 04/29/2018   Procedure: left total hip arthroplasty revision posterior approach;  Surgeon: [name], MD;  Location: [facility];  Service: Orthopedics;  Laterality: Left;  . WRIST GANGLION EXCISION Left 1970s     OB History    Gravida  1   Para      Term      Preterm      AB  0   Living  1     SAB      TAB      Ectopic      Multiple  0   Live Births               Home Medications    Prior to Admission medications   Medication Sig Start Date End Date Taking? Authorizing Provider  acyclovir (ZOVIRAX) 400 MG tablet Take 1 tablet (400 mg total) by mouth 2 (two) times daily. Patient taking differently: Take 400 mg by mouth See admin instructions. Take one tablet (400 mg) by mouth daily, may increase to one tablet (400 mg) twice daily as needed for fever blisters. 05/15/18  Yes Angiulli, Lavon Paganini, PA-C  aspirin EC 325 MG EC tablet Take 1 tablet (325 mg total) by mouth daily with breakfast. 06/23/18  Yes [name], MD  Calcium Carb-Cholecalciferol (CALCIUM 600 + D PO) Take 1 tablet by mouth 3 (three) times daily.    Yes [provider]  Carboxymethylcellulose Sodium  (THERATEARS) 0.25 % SOLN Place 1 drop into both eyes 2 (two) times daily as needed (dry eyes).   Yes [provider]  cefTRIAXone (ROCEPHIN) IVPB Inject 2 g into the vein daily. Indication:  Deep tissue infection L hip Last Day of Therapy:  08/01/18 Labs - Once weekly:  CBC/D and BMP, Labs - Every other week:  ESR and CRP 06/22/18 08/01/18 Yes [name], MD  Cholecalciferol (VITAMIN D3) 2000 units TABS Take 2,000 Units by mouth 2 (two) times daily.    Yes [provider]  clobetasol cream (TEMOVATE) 7.57 % Apply 1 application topically 2 (two) times daily. Patient taking differently: Apply 1 application topically daily as needed (  irritation).  11/04/17  Yes Huel Cote, NP  HYDROcodone-acetaminophen (NORCO) 5-325 MG tablet Take 1-2 tablets by mouth every 6 (six) hours as needed for moderate pain. 07/09/18  Yes [name], MD  ibuprofen (ADVIL,MOTRIN) 200 MG tablet Take 800 mg by mouth every 6 (six) hours as needed for mild pain.   Yes [provider]  levothyroxine (SYNTHROID, LEVOTHROID) 100 MCG tablet Take 1 tablet (100 mcg total) by mouth daily before breakfast. 05/15/18  Yes Angiulli, Lavon Paganini, PA-C  meclizine (ANTIVERT) 25 MG tablet Take 1 tablet (25 mg total) by mouth 3 (three) times daily as needed for dizziness. Vertigo. Also available OTC Patient taking differently: Take 25 mg by mouth daily as needed (vertigo).  01/18/17  Yes Rai, Ripudeep K, MD  methocarbamol (ROBAXIN) 500 MG tablet Take 1 tablet (500 mg total) by mouth every 8 (eight) hours as needed for muscle spasms. 07/06/18  Yes [name], MD  nystatin-triamcinolone (MYCOLOG II) cream Apply 1 application topically 2 (two) times daily as needed (irritation).  12/30/17  Yes [provider]  Omega-3 Fatty Acids (OMEGA-3 FISH OIL PO) Take 1 capsule by mouth 3 (three) times daily.    Yes [provider]  omeprazole (PRILOSEC OTC) 20 MG tablet Take 10 mg by mouth daily.   Yes [provider]  simvastatin (ZOCOR) 40 MG tablet Take 1 tablet (40 mg total) by mouth at bedtime. 05/15/18  Yes Angiulli, Lavon Paganini, PA-C  Specialty Vitamins Products (BIOTIN PLUS KERATIN) 10000-100 MCG-MG TABS Take 1 tablet by mouth 3 (three) times daily.    Yes [provider]  feeding supplement, ENSURE ENLIVE, (ENSURE ENLIVE) LIQD Take 237 mLs by mouth 2 (two) times daily between meals. 06/22/18   [name], MD  methocarbamol (ROBAXIN) 500 MG tablet Take 1 tablet (500 mg total) by mouth every 6 (six) hours as needed for muscle spasms. Patient not taking: Reported on 07/21/2018 06/05/18   Lanae Crumbly, PA-C    Family History Family History  Problem Relation Age of Onset  . Lymphoma Mother     Social History Social History   Tobacco Use  . Smoking status: Never Smoker  . Smokeless tobacco: Never Used  Substance Use Topics  . Alcohol use: Yes    Comment: 04/29/2018 "maybe 2 drinks/year"  . Drug use: Never     Allergies   Sulfa antibiotics and Fenofibrate   Review of Systems Review of Systems 10 Systems reviewed and are negative for acute change except as noted in the HPI.   Physical Exam Updated Vital Signs BP (!) 107/52   Pulse 85   Temp 98.4 F (36.9 C) (Oral)   Resp 17   Ht 5' 5"  (1.651 m)   Wt 88.5 kg   SpO2 95%   BMI 32.45 kg/m   Physical Exam Constitutional:      Comments: Patient is alert with clear mental status.  No respiratory distress.  Central obesity and physical deconditioning.  HENT:     Head: Normocephalic and atraumatic.     Mouth/Throat:     Mouth: Mucous membranes are moist.  Eyes:     Extraocular Movements: Extraocular movements intact.  Cardiovascular:     Rate and Rhythm: Normal rate and regular rhythm.  Pulmonary:     Effort: Pulmonary effort is normal.     Breath sounds: Normal breath sounds.  Abdominal:     Comments: Soft.  Mild central discomfort to palpation.  No guarding.  Musculoskeletal:     Comments: Patient  has dressing on left hip wound with a slight rim of blanching erythema.  Calves are soft and nontender.  Skin:    General: Skin is warm and dry.     Coloration: Skin is pale.  Neurological:     General: No focal deficit present.     Mental Status: She is oriented to person, place, and time.     Coordination: Coordination normal.  Psychiatric:        Mood and Affect: Mood normal.      ED Treatments / Results  Labs (all labs ordered are listed, but only abnormal results are displayed) Labs Reviewed  COMPREHENSIVE METABOLIC PANEL - Abnormal; Notable for the following components:      Result Value   Potassium 3.2 (*)    CO2 20 (*)    Glucose, Bld 180 (*)    Total Protein 6.4 (*)    Albumin 2.3 (*)    AST 46 (*)    Alkaline Phosphatase 201 (*)    All other components within normal limits  LIPASE, BLOOD - Abnormal; Notable for the following components:   Lipase 61 (*)    All other components within normal limits  CBC WITH DIFFERENTIAL/PLATELET - Abnormal; Notable for the following components:   RBC 3.10 (*)    Hemoglobin 7.7 (*)    HCT 25.9 (*)    MCH 24.8 (*)    MCHC 29.7 (*)    RDW 16.5 (*)    All other components within normal limits  SEDIMENTATION RATE - Abnormal; Notable for the following components:   Sed Rate 94 (*)    All other components within normal limits  C-REACTIVE PROTEIN - Abnormal; Notable for the following components:   CRP 14.6 (*)    All other components within normal limits  CULTURE, BLOOD (ROUTINE X 2)  CULTURE, BLOOD (ROUTINE X 2)  URINE CULTURE  URINALYSIS, ROUTINE W REFLEX MICROSCOPIC  I-STAT CG4 LACTIC ACID, ED  I-STAT TROPONIN, ED  TYPE AND SCREEN    EKG EKG Interpretation  Date/Time:  Tuesday July 21 2018 09:24:18 EST Ventricular Rate:  95 PR Interval:    QRS Duration: 94 QT Interval:  350 QTC Calculation: 440 R Axis:   21 Text Interpretation:  Sinus tachycardia Atrial premature complexes Sinus pause Abnormal R-wave  progression, early transition Borderline repolarization abnormality no change from previuos Confirmed by Charlesetta Shanks [phone]) on 07/21/2018 10:59:39 AM   Radiology Mr Thoracic Spine Wo Contrast  Result Date: 07/21/2018 CLINICAL DATA:  Back pain and chest pain. EXAM: MRI THORACIC SPINE WITHOUT CONTRAST TECHNIQUE: Multiplanar, multisequence MR imaging of the thoracic spine was performed. No intravenous contrast was administered. COMPARISON:  CT chest 07/18/2018. FINDINGS: Alignment: Normal except for slightly increased kyphosis in the midthoracic region. Vertebrae: Old healed compression fracture at T6 with loss of height anteriorly 30%. Old healed superior endplate deformities at T11-T12. Evidence recent fracture. Cord:  Normal Paraspinal and other soft tissues: Normal except for very small pleural effusions. Possible of lower lobe atelectasis and or pneumonia. Disc levels: T1-2: Disc bulge.  No stenosis. T2-3 through T4-5: Normal. T5-6: Small central disc protrusion. Mild ligamentous hypertrophy posteriorly. No compressive stenosis. T6-7: Endplate osteophytes and bulging of the disc. Slight narrowing of the ventral subarachnoid space no canal or foraminal stenosis. T7-8 through T9-10: Normal. T10-11: Minimal disc bulge.  No stenosis. T11-12: Mild disc bulge. Mild ligamentous hypertrophy. No stenosis. T12-L1 mild disc bulge.  Mild ligamentous hypertrophy.  No  stenosis IMPRESSION: No evidence of recent fracture in the thoracic region. Old healed fractures at T6, T11 and T12. No likely significant degenerative change. No compressive narrowing of the canal or. Small amount of pleural fluid. Possible left lower lobe atelectasis and or pneumonia. Electronically Signed   By: Nelson Chimes M.D.   On: 07/21/2018 13:54   Mr Lumbar Spine Wo Contrast  Result Date: 07/21/2018 CLINICAL DATA:  Acute presentation with back pain and chest pain EXAM: MRI LUMBAR SPINE WITHOUT CONTRAST TECHNIQUE: Multiplanar,  multisequence MR imaging of the lumbar spine was performed. No intravenous contrast was administered. COMPARISON:  03/20/2017 FINDINGS: Segmentation: 5 lumbar type vertebral bodies as numbered previously. Alignment:  Normal Vertebrae: Old healed superior endplate fracture at Z61. Old previously augmented healed fractures at L3, L4 and L5 without evidence of progression. Conus medullaris and cauda equina: Conus extends to the T12 level. Conus and cauda equina appear normal. Paraspinal and other soft tissues: Negative Disc levels: Mild non-compressive disc bulges at T11-12 and T12-L1. L1-2 is normal. L2-3: Disc bulge. Mild facet and ligamentous hypertrophy. Mild stenosis of both lateral recesses but without definite neural compression. L3-4: Disc bulge. Mild facet and ligamentous hypertrophy. Mild narrowing of both lateral recesses but without definite neural compression. L4-5: Disc bulge. Mild facet ligamentous hypertrophy. Minimal lateral recess narrowing without neural compression. L5-S1: Bulging of the disc more prominent towards the left. Mild facet hypertrophy. Mild narrowing of the subarticular lateral recesses but without neural compression. Foramina sufficiently patent. IMPRESSION: No acute fracture in the region. Old healed previously augmented fractures at L3, L4 and L5. Lower lumbar degenerative disc disease and degenerative facet disease. Mild narrowing of the lateral recesses but no apparent compressive stenosis. Compared to the study of 2018, there is no significant change. Electronically Signed   By: Nelson Chimes M.D.   On: 07/21/2018 13:50   Dg Chest Port 1 View  Result Date: 07/21/2018 CLINICAL DATA:  Weakness.  Chest pain.  History of pneumonia. EXAM: PORTABLE CHEST 1 VIEW COMPARISON:  Two-view chest x-ray 07/18/2018. CT of the chest 07/18/2018. FINDINGS: Left base atelectasis and scarring is again seen. Aeration is slightly improved. Lung volumes remain low. No other airspace disease is  present. Heart size is exaggerated. There is no edema. IMPRESSION: 1. Improving airspace disease and atelectasis at the left base. 2. Chronic curvilinear scarring at the left base. Electronically Signed   By: San Morelle M.D.   On: 07/21/2018 14:19    Procedures Procedures (including critical care time)  Medications Ordered in ED Medications  morphine 4 MG/ML injection 4 mg (has no administration in time range)  morphine 4 MG/ML injection 4 mg (4 mg Intravenous Given 07/21/18 1115)  0.9 %  sodium chloride infusion ( Intravenous Stopped 07/21/18 1332)     Initial Impression / Assessment and Plan / ED Course  I have reviewed the triage vital signs and the nursing notes.  Pertinent labs & imaging results that were available during my care of the patient were reviewed by me and considered in my medical decision making (see chart for details).  Clinical Course as of Jul 22 1655  Tue Jul 21, 2018  1100 Consult: Dr. Lorin Mercy has called me to update on concerns for the patient.  She over the past week has had significant decline and loss of ability to ambulate.  Agrees with proceeding with MRI and anticipated admission for potential transfusion for anemia and further evaluation of general weakness and failure to thrive with known concomitant  hip wound and PICC line.   [MP]  3943 Consult: Internal medicine teaching service accepts for admission.   [MP]    Clinical Course User Index [MP] Charlesetta Shanks, MD   Consult: Reviewed with family medicine teaching service.  After consultation, they did not feel he had much to offer the patient as they did not intend to transfuse blood at her current hemoglobin level.   Consult: Reviewed Dr. Lorin Mercy again.  He advises will admit to his service and proceed with PT OT, monitoring and determine how patient responds to increased activity level to determine if she needs blood transfusion.  Presents as outlined above.  I have obtained MRIs thoracic and  lumbar spine without findings of impingement or other emergent etiologies to explain the patient's ambulatory dysfunction.  He is significantly deconditioned and has been dealing with a chronic wound now and on antibiotics by PICC line.  Agree with plan of observation and monitoring.  Final Clinical Impressions(s) / ED Diagnoses   Final diagnoses:  None    ED Discharge Orders    None       Charlesetta Shanks, MD 07/21/18 [phone]

## 2018-07-22 MED ORDER — METHOCARBAMOL 500 MG PO TABS
500.0000 mg | ORAL_TABLET | Freq: Two times a day (BID) | ORAL | Status: DC | PRN
  Administered 2018-07-23: 500 mg via ORAL
  Filled 2018-07-22: qty 1

## 2018-07-22 MED ORDER — HYDROCODONE-ACETAMINOPHEN 5-325 MG PO TABS
1.0000 | ORAL_TABLET | Freq: Every evening | ORAL | Status: DC | PRN
  Administered 2018-07-22 – 2018-07-23 (×2): 2 via ORAL
  Filled 2018-07-22: qty 2
  Filled 2018-07-22 (×3): qty 1

## 2018-07-22 MED ORDER — MECLIZINE HCL 25 MG PO TABS
25.0000 mg | ORAL_TABLET | Freq: Two times a day (BID) | ORAL | Status: DC | PRN
  Filled 2018-07-22: qty 1

## 2018-07-22 MED ORDER — PANTOPRAZOLE SODIUM 40 MG PO TBEC
40.0000 mg | DELAYED_RELEASE_TABLET | Freq: Every day | ORAL | Status: DC
  Administered 2018-07-22 – 2018-07-25 (×4): 40 mg via ORAL
  Filled 2018-07-22 (×4): qty 1

## 2018-07-22 MED ORDER — CALCIUM CARBONATE-VITAMIN D 500-200 MG-UNIT PO TABS
1.0000 | ORAL_TABLET | Freq: Two times a day (BID) | ORAL | Status: DC
  Administered 2018-07-22 – 2018-07-25 (×7): 1 via ORAL
  Filled 2018-07-22 (×7): qty 1

## 2018-07-22 MED ORDER — LEVOTHYROXINE SODIUM 100 MCG PO TABS
100.0000 ug | ORAL_TABLET | Freq: Every day | ORAL | Status: DC
  Administered 2018-07-23 – 2018-07-25 (×3): 100 ug via ORAL
  Filled 2018-07-22 (×3): qty 1

## 2018-07-22 MED ORDER — ACYCLOVIR 400 MG PO TABS
400.0000 mg | ORAL_TABLET | Freq: Once | ORAL | Status: AC
  Administered 2018-07-22: 400 mg via ORAL
  Filled 2018-07-22: qty 1

## 2018-07-22 NOTE — Plan of Care (Signed)
  Problem: Pain Managment: Goal: General experience of comfort will improve Outcome: Progressing   Problem: Clinical Measurements: Goal: Will remain free from infection Outcome: Progressing   

## 2018-07-22 NOTE — Evaluation (Signed)
Physical Therapy Evaluation Patient Details Name: Emma Lewis MRN: [id number] DOB: Dec 21, 1950 Today's Date: 07/22/2018   History of Present Illness  Pt is a 67 y.o. female admitted 07/21/18 with worsening BLE weakness and fatigue since being home after recent admission for L femur fx s/p ORIF (05/2018) with subsequent admission for L THA revision (04/29/18). Imaging of L femur xray with no acute changes. MRI shows no cord compression; unchanged chronic compression fxs (from 2018). PMH includes vertigo, osteopenia, chronic lower back pain, migraines, arthritis.    Clinical Impression  Pt presents with an overall decrease in functional mobility secondary to above. PTA, pt was moving well with RW since femur ORIF and THA revision (05/2018), but has had progressive BLE weakness and fatigue, requiring assist from husband for all mobility and ADLs. Today, pt required min-modA for limited mobility with RW; pt limited by pain, fatigue and LLE weakness. Discussed recommendation for SNF-level therapies prior to return home; pt and husband unsure. Will follow acutely to address established goals.    Follow Up Recommendations SNF;Supervision for mobility/OOB    Equipment Recommendations  None recommended by PT    Recommendations for Other Services       Precautions / Restrictions Precautions Precautions: Fall;Other (comment) Precaution Comments: PICC line Restrictions Weight Bearing Restrictions: Yes LLE Weight Bearing: Partial weight bearing LLE Partial Weight Bearing Percentage or Pounds: 50      Mobility  Bed Mobility Overal bed mobility: Needs Assistance Bed Mobility: Supine to Sit     Supine to sit: Mod assist;HOB elevated     General bed mobility comments: ModA to assist LLE to EOB and scoot hips with bed pad  Transfers Overall transfer level: Needs assistance Equipment used: Rolling walker (2 wheeled) Transfers: Sit to/from Stand Sit to Stand: Mod assist;From elevated  surface         General transfer comment: ModA to assist trunk elevation from elevated bed height; pt with good ability to keep LLE extended in order to primarily WB through RLE and BUEs  Ambulation/Gait Ambulation/Gait assistance: Min assist Gait Distance (Feet): 1 Feet Assistive device: Rolling walker (2 wheeled) Gait Pattern/deviations: Step-to pattern;Decreased weight shift to left;Antalgic;Trunk flexed Gait velocity: Decreased Gait velocity interpretation: <1.31 ft/sec, indicative of household ambulator General Gait Details: Slow, antalgic steps from bed to recliner with RW and minA; pt demonstrating good ability to maintain 50% PWB (if not less than 50%). Further distance limited secondary to pain and fatigue  Stairs            Wheelchair Mobility    Modified Rankin (Stroke Patients Only)       Balance Overall balance assessment: Needs assistance   Sitting balance-Leahy Scale: Fair       Standing balance-Leahy Scale: Poor Standing balance comment: Heavy reliance on BUE support                             Pertinent Vitals/Pain Pain Assessment: Faces Faces Pain Scale: Hurts even more Pain Location: LLE Pain Descriptors / Indicators: Sore;Grimacing;Guarding Pain Intervention(s): Monitored during session;Premedicated before session    Home Living Family/patient expects to be discharged to:: Private residence Living Arrangements: Spouse/significant other   Type of Home: House Home Access: Ramped entrance     Home Layout: One level Home Equipment: Environmental consultant - 2 wheels;Cane - single point;Bedside commode;Crutches;Shower seat - built in;Wheelchair - manual      Prior Function Level of Independence: Needs assistance   Gait /  Transfers Assistance Needed: Days leading to admission, husband assisting LLE into/out of bed, assisting pt to stand with RW, pt limited to basically transfers to BSC/wheelchair, unable to walk secondary to leg weakness  ADL's  / Homemaking Assistance Needed: Husband assisting with ADLs; sponge bathes        Hand Dominance   Dominant Hand: Right    Extremity/Trunk Assessment   Upper Extremity Assessment Upper Extremity Assessment: Defer to OT evaluation    Lower Extremity Assessment Lower Extremity Assessment: LLE deficits/detail LLE Deficits / Details: L knee ext/flex 3/5, hip flex <3/5 LLE: Unable to fully assess due to pain       Communication   Communication: No difficulties  Cognition Arousal/Alertness: Awake/alert Behavior During Therapy: WFL for tasks assessed/performed Overall Cognitive Status: Within Functional Limits for tasks assessed                                 General Comments: WFL for simple tasks      General Comments General comments (skin integrity, edema, etc.): Husband present at end of session; pt and husband hesitant to agree to SNF    Exercises     Assessment/Plan    PT Assessment Patient needs continued PT services  PT Problem List Decreased strength;Decreased range of motion;Decreased activity tolerance;Decreased balance;Decreased mobility;Decreased knowledge of use of DME;Decreased knowledge of precautions;Pain       PT Treatment Interventions DME instruction;Gait training;Stair training;Functional mobility training;Therapeutic activities;Therapeutic exercise;Balance training;Patient/family education;Wheelchair mobility training    PT Goals (Current goals can be found in the Care Plan section)  Acute Rehab PT Goals Patient Stated Goal: Get strength back so I can do for myself and not have to rely on husband for everything PT Goal Formulation: With patient/family Time For Goal Achievement: 08/05/18 Potential to Achieve Goals: Good    Frequency Min 3X/week   Barriers to discharge        Co-evaluation               AM-PAC PT "6 Clicks" Mobility  Outcome Measure Help needed turning from your back to your side while in a flat bed  without using bedrails?: A Little Help needed moving from lying on your back to sitting on the side of a flat bed without using bedrails?: A Lot Help needed moving to and from a bed to a chair (including a wheelchair)?: A Little Help needed standing up from a chair using your arms (e.g., wheelchair or bedside chair)?: A Lot Help needed to walk in hospital room?: A Little Help needed climbing 3-5 steps with a railing? : Total 6 Click Score: 14    End of Session Equipment Utilized During Treatment: Gait belt Activity Tolerance: Patient tolerated treatment well;Patient limited by fatigue;Patient limited by pain Patient left: in chair;with call bell/phone within reach;with chair alarm set;with family/visitor present Nurse Communication: Mobility status PT Visit Diagnosis: Other abnormalities of gait and mobility (R26.89);Muscle weakness (generalized) (M62.81)    Time: 0934-1000 PT Time Calculation (min) (ACUTE ONLY): 26 min   Charges:   PT Evaluation $PT Eval Moderate Complexity: 1 Mod PT Treatments $Therapeutic Activity: 8-22 mins      Mabeline Caras, PT, DPT Acute Rehabilitation Services  Pager [phone] Office [place] 07/22/2018, 10:16 AM

## 2018-07-22 NOTE — Progress Notes (Signed)
Subjective:    Patient reports pain as mild to moderated. Was able to transfer to recliner with PT assist. Ambulated 1 foot. Limited by pain and fatigue. 2 units transfused per patient.   Objective: Vital signs in last 24 hours: Temp:  [98.1 F (36.7 C)-100 F (37.8 C)] 100 F (37.8 C) (12/25 0839) Pulse Rate:  [81-94] 90 (12/25 0637) Resp:  [16] 16 (12/25 0637) BP: (94-120)/(51-93) 109/93 (12/25 0637) SpO2:  [88 %-100 %] 88 % (12/25 0637)  Intake/Output from previous day: 12/24 0701 - 12/25 0700 In: 60 [Blood:60] Out: 1600 [Urine:1600] Intake/Output this shift: Total I/O In: 150 [P.O.:150] Out: -   Recent Labs    07/21/18 1000  HGB 7.7*   Recent Labs    07/21/18 1000  WBC 10.0  RBC 3.10*  HCT 25.9*  PLT 352   Recent Labs    07/21/18 1000  NA 137  K 3.2*  CL 105  CO2 20*  BUN 12  CREATININE 0.61  GLUCOSE 180*  CALCIUM 9.2   No results for input(s): LABPT, INR in the last 72 hours.  Neurologically intact    Less than 1cm serous spot on inside of dressing . Mild erythema midportion of incision. Top and bottom is healed. No purulence of serous fluid able to be expressed.  Mr Thoracic Spine Wo Contrast  Result Date: 07/21/2018 CLINICAL DATA:  Back pain and chest pain. EXAM: MRI THORACIC SPINE WITHOUT CONTRAST TECHNIQUE: Multiplanar, multisequence MR imaging of the thoracic spine was performed. No intravenous contrast was administered. COMPARISON:  CT chest 07/18/2018. FINDINGS: Alignment: Normal except for slightly increased kyphosis in the midthoracic region. Vertebrae: Old healed compression fracture at T6 with loss of height anteriorly 30%. Old healed superior endplate deformities at T11-T12. Evidence recent fracture. Cord:  Normal Paraspinal and other soft tissues: Normal except for very small pleural effusions. Possible of lower lobe atelectasis and or pneumonia. Disc levels: T1-2: Disc bulge.  No stenosis. T2-3 through T4-5: Normal. T5-6: Small central  disc protrusion. Mild ligamentous hypertrophy posteriorly. No compressive stenosis. T6-7: Endplate osteophytes and bulging of the disc. Slight narrowing of the ventral subarachnoid space no canal or foraminal stenosis. T7-8 through T9-10: Normal. T10-11: Minimal disc bulge.  No stenosis. T11-12: Mild disc bulge. Mild ligamentous hypertrophy. No stenosis. T12-L1 mild disc bulge.  Mild ligamentous hypertrophy.  No stenosis IMPRESSION: No evidence of recent fracture in the thoracic region. Old healed fractures at T6, T11 and T12. No likely significant degenerative change. No compressive narrowing of the canal or. Small amount of pleural fluid. Possible left lower lobe atelectasis and or pneumonia. Electronically Signed   By: Nelson Chimes M.D.   On: 07/21/2018 13:54   Mr Lumbar Spine Wo Contrast  Result Date: 07/21/2018 CLINICAL DATA:  Acute presentation with back pain and chest pain EXAM: MRI LUMBAR SPINE WITHOUT CONTRAST TECHNIQUE: Multiplanar, multisequence MR imaging of the lumbar spine was performed. No intravenous contrast was administered. COMPARISON:  03/20/2017 FINDINGS: Segmentation: 5 lumbar type vertebral bodies as numbered previously. Alignment:  Normal Vertebrae: Old healed superior endplate fracture at I94. Old previously augmented healed fractures at L3, L4 and L5 without evidence of progression. Conus medullaris and cauda equina: Conus extends to the T12 level. Conus and cauda equina appear normal. Paraspinal and other soft tissues: Negative Disc levels: Mild non-compressive disc bulges at T11-12 and T12-L1. L1-2 is normal. L2-3: Disc bulge. Mild facet and ligamentous hypertrophy. Mild stenosis of both lateral recesses but without definite neural compression. L3-4: Disc  bulge. Mild facet and ligamentous hypertrophy. Mild narrowing of both lateral recesses but without definite neural compression. L4-5: Disc bulge. Mild facet ligamentous hypertrophy. Minimal lateral recess narrowing without neural  compression. L5-S1: Bulging of the disc more prominent towards the left. Mild facet hypertrophy. Mild narrowing of the subarticular lateral recesses but without neural compression. Foramina sufficiently patent. IMPRESSION: No acute fracture in the region. Old healed previously augmented fractures at L3, L4 and L5. Lower lumbar degenerative disc disease and degenerative facet disease. Mild narrowing of the lateral recesses but no apparent compressive stenosis. Compared to the study of 2018, there is no significant change. Electronically Signed   By: Nelson Chimes M.D.   On: 07/21/2018 13:50   Dg Chest Port 1 View  Result Date: 07/21/2018 CLINICAL DATA:  Weakness.  Chest pain.  History of pneumonia. EXAM: PORTABLE CHEST 1 VIEW COMPARISON:  Two-view chest x-ray 07/18/2018. CT of the chest 07/18/2018. FINDINGS: Left base atelectasis and scarring is again seen. Aeration is slightly improved. Lung volumes remain low. No other airspace disease is present. Heart size is exaggerated. There is no edema. IMPRESSION: 1. Improving airspace disease and atelectasis at the left base. 2. Chronic curvilinear scarring at the left base. Electronically Signed   By: San Morelle M.D.   On: 07/21/2018 14:19    Assessment/Plan:      Up with therapy, better PO's , repeat dietary consultation for recommendations. See if she does better after transfusion. Change norco to only at night before sleeping. Placement.   Emma Lewis 07/22/2018, 1:06 PM

## 2018-07-23 LAB — CULTURE, BLOOD (ROUTINE X 2)
CULTURE: NO GROWTH
Culture: NO GROWTH
Special Requests: ADEQUATE
Special Requests: ADEQUATE

## 2018-07-23 LAB — BPAM RBC
Blood Product Expiration Date: 202001152359
Blood Product Expiration Date: 202001152359
ISSUE DATE / TIME: 201912250025
ISSUE DATE / TIME: 201912250344
Unit Type and Rh: 6200
Unit Type and Rh: 6200

## 2018-07-23 LAB — TYPE AND SCREEN
ABO/RH(D): A POS
Antibody Screen: NEGATIVE
Unit division: 0
Unit division: 0

## 2018-07-23 LAB — CBC
HCT: 31.9 % — ABNORMAL LOW (ref 36.0–46.0)
Hemoglobin: 10 g/dL — ABNORMAL LOW (ref 12.0–15.0)
MCH: 25.6 pg — ABNORMAL LOW (ref 26.0–34.0)
MCHC: 31.3 g/dL (ref 30.0–36.0)
MCV: 81.6 fL (ref 80.0–100.0)
Platelets: 319 10*3/uL (ref 150–400)
RBC: 3.91 MIL/uL (ref 3.87–5.11)
RDW: 15.9 % — ABNORMAL HIGH (ref 11.5–15.5)
WBC: 11.8 10*3/uL — ABNORMAL HIGH (ref 4.0–10.5)
nRBC: 0 % (ref 0.0–0.2)

## 2018-07-23 LAB — HIV ANTIBODY (ROUTINE TESTING W REFLEX): HIV Screen 4th Generation wRfx: NONREACTIVE

## 2018-07-23 MED ORDER — IBUPROFEN 200 MG PO TABS
600.0000 mg | ORAL_TABLET | Freq: Three times a day (TID) | ORAL | Status: DC | PRN
  Administered 2018-07-23 – 2018-07-25 (×5): 600 mg via ORAL
  Filled 2018-07-23 (×6): qty 3

## 2018-07-23 MED ORDER — BOOST PLUS PO LIQD
237.0000 mL | Freq: Three times a day (TID) | ORAL | Status: DC
  Administered 2018-07-24 – 2018-07-25 (×5): 237 mL via ORAL
  Filled 2018-07-23 (×7): qty 237

## 2018-07-23 MED ORDER — PROSIGHT PO TABS
1.0000 | ORAL_TABLET | Freq: Every day | ORAL | Status: DC
  Administered 2018-07-23 – 2018-07-25 (×3): 1 via ORAL
  Filled 2018-07-23 (×3): qty 1

## 2018-07-23 MED ORDER — PRO-STAT SUGAR FREE PO LIQD
30.0000 mL | Freq: Two times a day (BID) | ORAL | Status: DC
  Administered 2018-07-23 – 2018-07-25 (×4): 30 mL via ORAL
  Filled 2018-07-23 (×4): qty 30

## 2018-07-23 MED ORDER — SIMVASTATIN 20 MG PO TABS
40.0000 mg | ORAL_TABLET | Freq: Every day | ORAL | Status: DC
  Administered 2018-07-23 – 2018-07-24 (×2): 40 mg via ORAL
  Filled 2018-07-23 (×3): qty 2

## 2018-07-23 NOTE — Progress Notes (Signed)
   Subjective:    Patient reports pain as mild.    Objective: Vital signs in last 24 hours: Temp:  [98 F (36.7 C)-100 F (37.8 C)] 98.3 F (36.8 C) (12/26 0620) Pulse Rate:  [86-99] 95 (12/26 0620) Resp:  [16] 16 (12/25 1346) BP: (103-124)/(48-75) 103/48 (12/26 0620) SpO2:  [92 %-97 %] 92 % (12/26 0620)  Intake/Output from previous day: 12/25 0701 - 12/26 0700 In: 1984.8 [P.O.:540; I.V.:1219.7; IV Piggyback:225.1] Out: 1600 [Urine:1600] Intake/Output this shift: No intake/output data recorded.  Recent Labs    07/21/18 1000 07/23/18 0440  HGB 7.7* 10.0*   Recent Labs    07/21/18 1000 07/23/18 0440  WBC 10.0 11.8*  RBC 3.10* 3.91  HCT 25.9* 31.9*  PLT 352 319   Recent Labs    07/21/18 1000  NA 137  K 3.2*  CL 105  CO2 20*  BUN 12  CREATININE 0.61  GLUCOSE 180*  CALCIUM 9.2   No results for input(s): LABPT, INR in the last 72 hours.  Neurologically intact No results found.  Assessment/Plan:    Up with therapy  Full WBAT. Encourage PO's. Dressing change. Hgb now 10. Will see if she does better with therapy  [name] 07/23/2018, 7:43 AM

## 2018-07-23 NOTE — Sepsis Progress Note (Signed)
Patient buttocks and groin is raw, barrier cream applied to it. Purewick off at this time. Will continue to monitor patient

## 2018-07-23 NOTE — Evaluation (Signed)
Occupational Therapy Evaluation Patient Details Name: Emma Lewis MRN: [id number] DOB: 01-Jun-1951 Today's Date: 07/23/2018    History of Present Illness Pt is a 67 y.o. female admitted 07/21/18 with worsening BLE weakness and fatigue since being home after recent admission for L femur fx s/p ORIF (05/2018) with subsequent admission for L THA revision (04/29/18). Imaging of L femur xray with no acute changes. MRI shows no cord compression; unchanged chronic compression fxs (from 2018). PMH includes vertigo, osteopenia, chronic lower back pain, migraines, arthritis.   Clinical Impression   Therapist familiar with Pt from previous admission. Today Pt was able to perform stand pivot transfer for return to bed at mod A. Pt has been largely dependent on husband since last admission for ADL and IADL and largely dependent on WC for mobility. Talked with patient about the benefits of going to a short term SNF to focus on therapy, building up independence, balance, strength, and working towards her goals. At this time, I truly feel like she would benefit from the increased support, frequency of therapy at a SNF post-acute. OT will continue to follow acutely.    Follow Up Recommendations  SNF;Supervision/Assistance - 24 hour    Equipment Recommendations  None recommended by OT(Pt has appropriate DME)    Recommendations for Other Services       Precautions / Restrictions Precautions Precautions: Fall Precaution Comments: PICC line Restrictions Weight Bearing Restrictions: Yes LLE Weight Bearing: Weight bearing as tolerated LLE Partial Weight Bearing Percentage or Pounds: 50 Other Position/Activity Restrictions: changed to WBAT on 12/26      Mobility Bed Mobility Overal bed mobility: Needs Assistance Bed Mobility: Sit to Supine     Supine to sit: Mod assist;HOB elevated Sit to supine: Max assist   General bed mobility comments: max A for BLE back into bed, increased time and effort with  vc required  Transfers Overall transfer level: Needs assistance Equipment used: Rolling walker (2 wheeled) Transfers: Sit to/from [facility] Sit to Stand: Mod assist;From elevated surface;+2 safety/equipment Stand pivot transfers: Mod assist(HEAVY mod)       General transfer comment: vc for hand placement, rocking used to initiate standing, max encouragement needed    Balance Overall balance assessment: Needs assistance Sitting-balance support: No upper extremity supported;Feet supported Sitting balance-Leahy Scale: Fair     Standing balance support: Bilateral upper extremity supported Standing balance-Leahy Scale: Poor Standing balance comment: Heavy reliance on BUE support                           ADL either performed or assessed with clinical judgement   ADL Overall ADL's : Needs assistance/impaired Eating/Feeding: Modified independent;Sitting   Grooming: Wash/dry hands;Wash/dry face;Set up;Sitting Grooming Details (indicate cue type and reason): unable to maintain standing for grooming activities Upper Body Bathing: Set up;Sitting   Lower Body Bathing: Moderate assistance;With caregiver independent assisting;Sitting/lateral leans Lower Body Bathing Details (indicate cue type and reason): Pt sponge bathes Upper Body Dressing : Set up;Sitting   Lower Body Dressing: Moderate assistance;With caregiver independent assisting;Sit to/from stand Lower Body Dressing Details (indicate cue type and reason): Pt's husband has been assisting at home,  Toilet Transfer: Moderate assistance;Stand-pivot;RW Toilet Transfer Details (indicate cue type and reason): simulated through return to bed - right side leading Toileting- Clothing Manipulation and Hygiene: Moderate assistance       Functional mobility during ADLs: Moderate assistance;+2 for safety/equipment;Rolling walker(SPt only this session)       Vision  Perception     Praxis       Pertinent Vitals/Pain Pain Assessment: Faces Faces Pain Scale: Hurts even more Pain Location: LLE Pain Descriptors / Indicators: Sore;Grimacing;Guarding;Discomfort Pain Intervention(s): Limited activity within patient's tolerance;Monitored during session;Repositioned     Hand Dominance Right   Extremity/Trunk Assessment Upper Extremity Assessment Upper Extremity Assessment: Overall WFL for tasks assessed   Lower Extremity Assessment Lower Extremity Assessment: Defer to PT evaluation       Communication Communication Communication: No difficulties   Cognition Arousal/Alertness: Awake/alert Behavior During Therapy: WFL for tasks assessed/performed Overall Cognitive Status: Within Functional Limits for tasks assessed                                 General Comments: WFL for simple tasks   General Comments  "I don't like the SNF options near me in [facility]"    Exercises     Shoulder Instructions      Home Living Family/patient expects to be discharged to:: Private residence Living Arrangements: Spouse/significant other(small dog) Available Help at Discharge: Family;Available 24 hours/day Type of Home: House Home Access: Ramped entrance     Home Layout: One level     Bathroom Shower/Tub: Occupational psychologist: Handicapped height Bathroom Accessibility: Yes How Accessible: Accessible via walker Home Equipment: [place] - 2 wheels;Cane - single point;Bedside commode;Crutches;Shower seat - built in;Wheelchair - manual          Prior Functioning/Environment Level of Independence: Needs assistance  Gait / Transfers Assistance Needed: Days leading to admission, husband assisting LLE into/out of bed, assisting pt to stand with RW, pt limited to basically transfers to BSC/wheelchair, unable to walk secondary to leg weakness ADL's / Homemaking Assistance Needed: Husband assisting with ADLs; sponge bathes            OT Problem List:  Decreased range of motion;Decreased activity tolerance;Impaired balance (sitting and/or standing);Decreased safety awareness;Decreased knowledge of use of DME or AE;Obesity;Pain      OT Treatment/Interventions: Self-care/ADL training;Therapeutic exercise;Therapeutic activities;Patient/family education;Balance training    OT Goals(Current goals can be found in the care plan section) Acute Rehab OT Goals Patient Stated Goal: Get strength back so I can do for myself and not have to rely on husband for everything OT Goal Formulation: With patient Time For Goal Achievement: 08/06/18 Potential to Achieve Goals: Good ADL Goals Pt Will Perform Lower Body Bathing: with supervision;with caregiver independent in assisting;with adaptive equipment;sitting/lateral leans Pt Will Perform Lower Body Dressing: with caregiver independent in assisting;with adaptive equipment;sit to/from stand;with min guard assist Pt Will Transfer to Toilet: with supervision;ambulating Pt Will Perform Toileting - Clothing Manipulation and hygiene: with supervision;sit to/from stand Additional ADL Goal #1: Pt will perform bed mobility at supervision level prior to enagaging in ADL activity  OT Frequency: Min 2X/week   Barriers to D/C:            Co-evaluation              AM-PAC OT "6 Clicks" Daily Activity     Outcome Measure Help from another person eating meals?: None Help from another person taking care of personal grooming?: None(in sitting) Help from another person toileting, which includes using toliet, bedpan, or urinal?: A Lot Help from another person bathing (including washing, rinsing, drying)?: A Lot Help from another person to put on and taking off regular upper body clothing?: None(in sitting) Help from another person to put  on and taking off regular lower body clothing?: A Lot 6 Click Score: 18   End of Session Equipment Utilized During Treatment: Gait belt;Rolling walker Nurse Communication:  Mobility status;Patient requests pain meds;Weight bearing status  Activity Tolerance: Patient tolerated treatment well;Patient limited by pain Patient left: in bed;with call bell/phone within reach;with bed alarm set  OT Visit Diagnosis: Unsteadiness on feet (R26.81);Other abnormalities of gait and mobility (R26.89);Muscle weakness (generalized) (M62.81);Pain Pain - Right/Left: Left Pain - part of body: Hip;Leg                Time: 0712-1975 OT Time Calculation (min): 27 min Charges:  OT General Charges $OT Visit: 1 Visit OT Evaluation $OT Eval Moderate Complexity: 1 Mod OT Treatments $Self Care/Home Management : 8-22 mins  Emma Lewis OTR/L Acute Rehabilitation Services Pager: [phone] Office: [phone]  Emma Lewis 07/23/2018, 5:35 PM

## 2018-07-23 NOTE — Progress Notes (Addendum)
Initial Nutrition Assessment  DOCUMENTATION CODES:   Obesity unspecified  INTERVENTION:   Prostat 30 ml po BID, each supplement provides 100 kcal and 15 gram protein  Boost Plus po TID, each supplement provides 360 kcal and 14 grams of protein  Ocuvite multivitamin daily to promote wound healing  NUTRITION DIAGNOSIS:   Increased nutrient needs related to post-op healing as evidenced by estimated needs.   GOAL:   Patient will meet greater than or equal to 90% of their needs   MONITOR:   PO intake, Supplement acceptance, Diet advancement, Labs, Weight trends  REASON FOR ASSESSMENT:   Consult Assessment of nutrition requirement/status  ASSESSMENT:   67 yo female with PMH chronic anemia, GERD, fatty liver and HTN.  Admitted with weakness and infected left hip hematoma s/p 2 hip revisions with periprosthetic femur fracture long femoral plate with PICC line.  Pt alert and oriented when DI entered the room. Pt's husband was at bedside.   Pt states normally eating cereal and milk for breakfast, a sandwich with [place] and ham for lunch and a chicken pot pie for dinner. Pt drinks 1 ensure at home and has a 22 gram protein bar for a snack during the day. While in the hospital, pt reported not eating well yesterday but ate a mufin, a bowl of fruit and some chocolate milk for breakfast.  Pt reports having no appetitie and stated a change in flavor of foods she normally likes to eat.Pt requested Boost Plus supplement instead of Ensure due to a less metallic taste. Pt amenable with Prostat 23ml po BID.   Pt and husband reported UBW at around 190# but husband concerned with further weightloss if pt continues to have poor po. Weight trends per chart have been consistent but appear to be stated rather than measured, therefore question accuracy.   Medications reviewed and include:  Oscal with Vitamin D BID Colace 100 mg BID  Protonix 40 mg IV fluids  Rocephin 2g Q24 hours  Labs  reviewed:  K 3.2 (L) Hemoglobin 7.7 (L) HCT 25.9 (L)  NUTRITION - FOCUSED PHYSICAL EXAM:    Most Recent Value  Orbital Region  No depletion  Upper Arm Region  Mild depletion  Thoracic and Lumbar Region  No depletion  Buccal Region  No depletion  Temple Region  Mild depletion  Clavicle Bone Region  No depletion  Clavicle and Acromion Bone Region  No depletion  Scapular Bone Region  No depletion  Dorsal Hand  No depletion  Patellar Region  No depletion  Anterior Thigh Region  No depletion  Posterior Calf Region  No depletion  Edema (RD Assessment)  Mild [LLE]  Hair  Reviewed  Eyes  Reviewed  Mouth  Reviewed  Skin  Reviewed [ecchymosis BUE]  Nails  Reviewed       Diet Order:   Diet Order            Diet regular Room service appropriate? Yes; Fluid consistency: Thin  Diet effective now              EDUCATION NEEDS:   No education needs have been identified at this time  Skin:  Skin Assessment: Reviewed RN Assessment(incision left hip)  Last BM:  12/23  Height:   Ht Readings from Last 1 Encounters:  07/21/18 5\' 5"  (1.651 m)    Weight:   Wt Readings from Last 1 Encounters:  07/21/18 88.5 kg    Ideal Body Weight:  56.8 kg  BMI:  Body mass  index is 32.45 kg/m.  Estimated Nutritional Needs:   Kcal:  1700-1850 kcal/d  Protein:  100-115 g/d  Fluid:  1.6 L    Mauricia Area, MS, Dietetic Intern Pager: [phone] After hours Pager: [phone]

## 2018-07-23 NOTE — Progress Notes (Signed)
Physical Therapy Treatment Patient Details Name: Emma Lewis MRN: [id number] DOB: 12/06/1950 Today's Date: 07/23/2018    History of Present Illness Pt is a 67 y.o. female admitted 07/21/18 with worsening BLE weakness and fatigue since being home after recent admission for L femur fx s/p ORIF (05/2018) with subsequent admission for L THA revision (04/29/18). Imaging of L femur xray with no acute changes. MRI shows no cord compression; unchanged chronic compression fxs (from 2018). PMH includes vertigo, osteopenia, chronic lower back pain, migraines, arthritis.    PT Comments    Patient seen for mobility progression. Pt is making very gradual progress toward PT goals. Pt tolerates gait training for ~4-5 ft this session. Current plan remains appropriate.    Follow Up Recommendations  SNF;Supervision for mobility/OOB     Equipment Recommendations  None recommended by PT    Recommendations for Other Services       Precautions / Restrictions Precautions Precautions: Fall Precaution Comments: PICC line Restrictions Weight Bearing Restrictions: Yes LLE Weight Bearing: Partial weight bearing LLE Partial Weight Bearing Percentage or Pounds: 50    Mobility  Bed Mobility Overal bed mobility: Needs Assistance Bed Mobility: Supine to Sit     Supine to sit: Mod assist;HOB elevated     General bed mobility comments: assist to bring L LE and hips to EOB; cues for sequencing; use of rail  Transfers Overall transfer level: Needs assistance Equipment used: Rolling walker (2 wheeled) Transfers: Sit to/from Stand Sit to Stand: Mod assist;From elevated surface;+2 safety/equipment         General transfer comment: +2 assist for initial stand and pt unable to stand fully upright; seond trial pt stood upright with assist to power up and for weight shifting once in standing; cues for positioning and safe hand placement  Ambulation/Gait Ambulation/Gait assistance: Min assist;+2  safety/equipment Gait Distance (Feet): (~4-5) Assistive device: Rolling walker (2 wheeled) Gait Pattern/deviations: Step-to pattern;Decreased weight shift to left;Antalgic;Trunk flexed;Decreased step length - right;Decreased step length - left;Decreased dorsiflexion - left Gait velocity: Decreased   General Gait Details: cues for posture, weight shifting, L step (physical assist for inital step), and then for use of bilat UE for R step   Stairs             Wheelchair Mobility    Modified Rankin (Stroke Patients Only)       Balance Overall balance assessment: Needs assistance   Sitting balance-Leahy Scale: Fair       Standing balance-Leahy Scale: Poor Standing balance comment: Heavy reliance on BUE support                            Cognition Arousal/Alertness: Awake/alert Behavior During Therapy: WFL for tasks assessed/performed Overall Cognitive Status: Within Functional Limits for tasks assessed                                 General Comments: WFL for simple tasks      Exercises      General Comments        Pertinent Vitals/Pain Pain Assessment: Faces Faces Pain Scale: Hurts even more Pain Location: LLE Pain Descriptors / Indicators: Sore;Grimacing;Guarding Pain Intervention(s): Limited activity within patient's tolerance;Monitored during session;Repositioned;Patient requesting pain meds-RN notified;RN gave pain meds during session    Home Living  Prior Function            PT Goals (current goals can now be found in the care plan section) Progress towards PT goals: Progressing toward goals    Frequency    Min 3X/week      PT Plan Current plan remains appropriate    Co-evaluation              AM-PAC PT "6 Clicks" Mobility   Outcome Measure  Help needed turning from your back to your side while in a flat bed without using bedrails?: A Little Help needed moving from lying on  your back to sitting on the side of a flat bed without using bedrails?: A Lot Help needed moving to and from a bed to a chair (including a wheelchair)?: A Little Help needed standing up from a chair using your arms (e.g., wheelchair or bedside chair)?: A Lot Help needed to walk in hospital room?: A Little Help needed climbing 3-5 steps with a railing? : Total 6 Click Score: 14    End of Session Equipment Utilized During Treatment: Gait belt Activity Tolerance: Patient limited by fatigue;Patient limited by pain Patient left: in chair;with call bell/phone within reach;with family/visitor present Nurse Communication: Mobility status PT Visit Diagnosis: Other abnormalities of gait and mobility (R26.89);Muscle weakness (generalized) (M62.81)     Time: 4967-5916 PT Time Calculation (min) (ACUTE ONLY): 37 min  Charges:  $Gait Training: 8-22 mins $Therapeutic Activity: 8-22 mins                     Earney Navy, PTA Acute Rehabilitation Services Pager: [phone] Office: [phone]     Darliss Cheney 07/23/2018, 2:20 PM

## 2018-07-24 ENCOUNTER — Inpatient Hospital Stay (HOSPITAL_COMMUNITY): Payer: Medicare Other

## 2018-07-24 ENCOUNTER — Ambulatory Visit (INDEPENDENT_AMBULATORY_CARE_PROVIDER_SITE_OTHER): Payer: Medicare Other | Admitting: Orthopaedic Surgery

## 2018-07-24 ENCOUNTER — Encounter (INDEPENDENT_AMBULATORY_CARE_PROVIDER_SITE_OTHER): Payer: Self-pay

## 2018-07-24 DIAGNOSIS — R7303 Prediabetes: Secondary | ICD-10-CM

## 2018-07-24 DIAGNOSIS — T8452XA Infection and inflammatory reaction due to internal left hip prosthesis, initial encounter: Principal | ICD-10-CM

## 2018-07-24 DIAGNOSIS — R262 Difficulty in walking, not elsewhere classified: Secondary | ICD-10-CM

## 2018-07-24 DIAGNOSIS — K219 Gastro-esophageal reflux disease without esophagitis: Secondary | ICD-10-CM

## 2018-07-24 DIAGNOSIS — Z96642 Presence of left artificial hip joint: Secondary | ICD-10-CM

## 2018-07-24 DIAGNOSIS — Z95828 Presence of other vascular implants and grafts: Secondary | ICD-10-CM

## 2018-07-24 DIAGNOSIS — B964 Proteus (mirabilis) (morganii) as the cause of diseases classified elsewhere: Secondary | ICD-10-CM

## 2018-07-24 DIAGNOSIS — M1990 Unspecified osteoarthritis, unspecified site: Secondary | ICD-10-CM

## 2018-07-24 DIAGNOSIS — Z881 Allergy status to other antibiotic agents status: Secondary | ICD-10-CM

## 2018-07-24 DIAGNOSIS — L259 Unspecified contact dermatitis, unspecified cause: Secondary | ICD-10-CM

## 2018-07-24 DIAGNOSIS — D649 Anemia, unspecified: Secondary | ICD-10-CM

## 2018-07-24 DIAGNOSIS — M8580 Other specified disorders of bone density and structure, unspecified site: Secondary | ICD-10-CM

## 2018-07-24 DIAGNOSIS — E039 Hypothyroidism, unspecified: Secondary | ICD-10-CM

## 2018-07-24 DIAGNOSIS — R29898 Other symptoms and signs involving the musculoskeletal system: Secondary | ICD-10-CM

## 2018-07-24 DIAGNOSIS — G43909 Migraine, unspecified, not intractable, without status migrainosus: Secondary | ICD-10-CM

## 2018-07-24 DIAGNOSIS — K760 Fatty (change of) liver, not elsewhere classified: Secondary | ICD-10-CM

## 2018-07-24 DIAGNOSIS — Z9889 Other specified postprocedural states: Secondary | ICD-10-CM

## 2018-07-24 DIAGNOSIS — Z888 Allergy status to other drugs, medicaments and biological substances status: Secondary | ICD-10-CM

## 2018-07-24 LAB — CBC
HCT: 31.2 % — ABNORMAL LOW (ref 36.0–46.0)
Hemoglobin: 9.9 g/dL — ABNORMAL LOW (ref 12.0–15.0)
MCH: 26.3 pg (ref 26.0–34.0)
MCHC: 31.7 g/dL (ref 30.0–36.0)
MCV: 83 fL (ref 80.0–100.0)
Platelets: 311 10*3/uL (ref 150–400)
RBC: 3.76 MIL/uL — ABNORMAL LOW (ref 3.87–5.11)
RDW: 16.3 % — ABNORMAL HIGH (ref 11.5–15.5)
WBC: 11.8 10*3/uL — ABNORMAL HIGH (ref 4.0–10.5)
nRBC: 0 % (ref 0.0–0.2)

## 2018-07-24 MED ORDER — FERROUS SULFATE 325 (65 FE) MG PO TABS
325.0000 mg | ORAL_TABLET | Freq: Two times a day (BID) | ORAL | Status: DC
  Administered 2018-07-24 – 2018-07-25 (×2): 325 mg via ORAL
  Filled 2018-07-24 (×2): qty 1

## 2018-07-24 MED ORDER — ACYCLOVIR 200 MG PO CAPS
400.0000 mg | ORAL_CAPSULE | Freq: Two times a day (BID) | ORAL | Status: DC | PRN
  Administered 2018-07-24: 400 mg via ORAL
  Filled 2018-07-24 (×2): qty 2

## 2018-07-24 MED ORDER — IOHEXOL 300 MG/ML  SOLN
100.0000 mL | Freq: Once | INTRAMUSCULAR | Status: AC | PRN
  Administered 2018-07-24: 100 mL via INTRAVENOUS

## 2018-07-24 NOTE — Consult Note (Signed)
[facility]    Date of Admission:  07/21/2018     Total days of antibiotics 34 Ceftriaxone              Reason for Consult: Left total hip infection     Referring Provider: yates  Primary Care Provider: Philmore Pali, NP   Assessment: Emma Lewis is a 67 y.o. female s/p I&D following infected hematoma surrounding L total hip prosthesis now on antibiotic day 34 with ceftriaxone for proteus. She has had some acute elevation of inflammatory markers and WBC counts; although the hip looks ok there is some area of induration. Would like to check CT scan of hip to ensure no fluid accumulation or concern to be thorough. Her PICC line looks perfect and is functioning well. We have a feeling weakness was more related to anemia. She has 2 blown IV sites - would leave the PICC and continue Ceftriaxone while inpatient and plan to transition to oral therapy with ciprofloxacin 750 mg BID. Will check iron stores - if she would benefit from infusion this would be helpful as there is interaction with oral therapy and cipro (decreases absorption of abx). Will need to discuss timing of other supplements while on cipro treatment.   Plan: 1. Continue ceftriaxone for now while inpatient  2. Check CT scan of left hip (suspect MRI would yield too much artifact)  3. Check Iron studies - consider fereheme transfusion while here if low 4. Leave PICC for inpatient access and will D/C when primary team feels ready for discharge.  5. At discharge would provide with 4 weeks of Ciprofloxacin 750 mg BID   Principal Problem:   Proteus mirabilis infection Active Problems:   Ambulatory dysfunction   Weakness of both legs   . calcium-vitamin D  1 tablet Oral BID  . docusate sodium  100 mg Oral BID  . feeding supplement (PRO-STAT SUGAR FREE 64)  30 mL Oral BID  . ferrous sulfate  325 mg Oral BID WC  . lactose free nutrition  237 mL Oral TID WC  . levothyroxine  100 mcg Oral Q0600    . multivitamin  1 tablet Oral Daily  . pantoprazole  40 mg Oral Daily  . simvastatin  40 mg Oral q1800    HPI: Emma Lewis is a 67 y.o. female with complicated orthopedic history involving the left hip. Other significant pmhx noted for arthritis, fatty liver, GERD, migraines, osteopenia, hypothyroidism, prediabetes.   Original L THR placed 2011. 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 In October 2019 underwent revision with long trochanteric osteotomy. She has had signficnat pain and hip subluxation with periprosthetic femur fx extending down to tip of hardware; revision surgery again 06/01/18 with bone grafting and fixation with multiple screws and cables. Unfortunately on 06/19/18 she required evacuation/debridement of left hip hematoma that despite no gross evidence of infection grew out proteus mirabilis (pansensitive). We were consulted and decision to treat with ceftriaxone 2 gm IV q24h x 42 days (through January 4). She has required ER visit twice for shortness of breath and has also been followed closely with her orthopedic team. Last presented 12/24 with worsened weakness, SOB. ED visit on 12/21 with exertional dyspnea - ruled out PE/pneumonia and no DVT. She was noted to have some mild anemia at this time but not felt to  be symptomatic for this. Ongoing anemia with hgb down to 7.7 (this has been pretty chronic since surgery 27m ago).   She feels that the picc line has caused weakness in her R upper extremity that has affected her grip and that she has soreness when she raises her elbow to shoulder height. Also has itching around dressing and "tape clumps" that are bothering her. She has had no fevers at home. Has had some yellow thin drainage noted to dressing that is improving now per her husband (caretaker). No odor noted on dressing material. She feels significantly improved  after receiving blood transfusion and not as weak. Would like PICC line out asap. They have been doing very well with infusing medications as prescribed and missed no doses but find that the PICC line is cumbersome.   Review of Systems  Constitutional: Positive for malaise/fatigue (improved). Negative for chills, diaphoresis and fever.  Eyes: Negative for blurred vision and photophobia.  Respiratory: Negative for cough and sputum production.   Cardiovascular: Negative for chest pain and leg swelling.  Gastrointestinal: Negative for diarrhea, nausea and vomiting.  Genitourinary: Negative for dysuria.  Musculoskeletal: Positive for back pain and joint pain.  Skin: Positive for itching (under PICC dressing). Negative for rash (dry rash around central hip incision. ).  Neurological: Positive for weakness. Negative for headaches.    Past Medical History:  Diagnosis Date  . Arthritis    "all over my body" (04/29/2018)  . Chronic lower back pain   . Fatty liver   . Fever blister    Takes Acyclovir  . GERD (gastroesophageal reflux disease)   . Hyperlipemia   . Hypothyroidism   . Migraines   . Osteopenia   . Pneumonia    "once; years ago" (04/29/2018)  . PONV (postoperative nausea and vomiting)   . Pre-diabetes   . Seasonal allergies   . Vertigo     Social History   Tobacco Use  . Smoking status: Never Smoker  . Smokeless tobacco: Never Used  Substance Use Topics  . Alcohol use: Yes    Comment: 04/29/2018 "maybe 2 drinks/year"  . Drug use: Never    Family History  Problem Relation Age of Onset  . Lymphoma Mother    Allergies  Allergen Reactions  . Sulfa Antibiotics Other (See Comments)     Childhood allergy Mother said "I liked to have died"  . Fenofibrate Diarrhea    Reaction while taking with magnesium    OBJECTIVE: Blood pressure (!) 141/74, pulse (!) 103, temperature 99.6 F (37.6 C), temperature source Oral, resp. rate 17, height 5\' 5"  (1.651 m), weight 88.5 kg,  SpO2 94 %.  Physical Exam Constitutional:      General: She is not in acute distress.    Appearance: Normal appearance. She is not ill-appearing.  HENT:     Mouth/Throat:     Mouth: Mucous membranes are moist.     Pharynx: Oropharynx is clear.  Eyes:     General: No scleral icterus. Neck:     Musculoskeletal: Normal range of motion.  Cardiovascular:     Rate and Rhythm: Normal rate and regular rhythm.     Heart sounds: No murmur.  Pulmonary:     Effort: Pulmonary effort is normal.     Breath sounds: Normal breath sounds.  Abdominal:     General: There is no distension.     Palpations: Abdomen is soft.  Musculoskeletal:     Left hip: She exhibits decreased range of motion  and tenderness.       Legs:  Skin:    General: Skin is warm and dry.     Capillary Refill: Capillary refill takes less than 2 seconds.     Coloration: Skin is not pale.  Neurological:     General: No focal deficit present.     Mental Status: She is alert and oriented to person, place, and time.     GCS: GCS eye subscore is 4. GCS verbal subscore is 5. GCS motor subscore is 6.     Motor: No weakness.     Comments: Equal grip strength bilaterally. Adequate radial pulse site. No swelling.    RUE PICC line - clean/dry dressing. Insertion site w/o erythema, tenderness, drainage, cording or distal swelling of affected extremity. Slight rash noted under dressing with some mild redness.   Lab Results Lab Results  Component Value Date   WBC 11.8 (H) 07/24/2018   HGB 9.9 (L) 07/24/2018   HCT 31.2 (L) 07/24/2018   MCV 83.0 07/24/2018   PLT 311 07/24/2018    Lab Results  Component Value Date   CREATININE 0.61 07/21/2018   BUN 12 07/21/2018   NA 137 07/21/2018   K 3.2 (L) 07/21/2018   CL 105 07/21/2018   CO2 20 (L) 07/21/2018    Lab Results  Component Value Date   ALT 35 07/21/2018   AST 46 (H) 07/21/2018   ALKPHOS 201 (H) 07/21/2018   BILITOT 0.3 07/21/2018     Microbiology: Recent Results (from  the past 240 hour(s))  Blood culture (routine x 2)     Status: None   Collection Time: 07/18/18  3:10 PM  Result Value Ref Range Status   Specimen Description BLOOD LEFT ANTECUBITAL  Final   Special Requests   Final    BOTTLES DRAWN AEROBIC AND ANAEROBIC Blood Culture adequate volume   Culture   Final    NO GROWTH 5 DAYS Performed at [facility], [place] [address], [place], [place] [address]    Report Status 07/23/2018 FINAL  Final  Blood culture (routine x 2)     Status: None   Collection Time: 07/18/18  3:28 PM  Result Value Ref Range Status   Specimen Description BLOOD LEFT WRIST  Final   Special Requests AEROBIC BOTTLE ONLY Blood Culture adequate volume  Final   Culture   Final    NO GROWTH 5 DAYS Performed at [facility], [place] [address] [place], [place] [address]    Report Status 07/23/2018 FINAL  Final  Culture, blood (routine x 2)     Status: None (Preliminary result)   Collection Time: 07/21/18 10:01 AM  Result Value Ref Range Status   Specimen Description BLOOD LEFT ANTECUBITAL  Final   Special Requests   Final    BOTTLES DRAWN AEROBIC AND ANAEROBIC Blood Culture adequate volume   Culture   Final    NO GROWTH 3 DAYS Performed at [facility], [place] [address] [place], [place] [address]    Report Status PENDING  Incomplete  Culture, blood (routine x 2)     Status: None (Preliminary result)   Collection Time: 07/21/18 10:06 AM  Result Value Ref Range Status   Specimen Description BLOOD LEFT HAND  Final   Special Requests   Final    BOTTLES DRAWN AEROBIC AND ANAEROBIC Blood Culture adequate volume   Culture   Final    NO GROWTH 3 DAYS Performed at [facility],  [address], [place], [place] [address]    Report Status PENDING  Incomplete    Janene [name], MSN, NP-C [facility] [place] Cell: [phone] Pager: [phone]  07/24/2018 2:25 PM

## 2018-07-24 NOTE — Care Management Important Message (Signed)
Important Message  Patient Details  Name: Emma Lewis MRN: [id number] Date of Birth: 1950-08-12   Medicare Important Message Given:  Yes    Barb Merino Jaylnn Ullery 07/24/2018, 4:49 PM

## 2018-07-24 NOTE — Progress Notes (Signed)
CSW met with patient and husband regarding SNF recommendation. They report preference for CIR and CSW notes CIR assessment stating patient is appropriate for inpatient rehab before returning home. Family is in agreement as husband reports patient is adamant regarding never going to a [facility].   Please notified CSW if new needs arise or discharge plan changes from current plan of CIR.   [place], [place]

## 2018-07-24 NOTE — Progress Notes (Signed)
IP rehab admissions - I met with patient and her husband.  Patient is undecided about whether she wants to come to CIR or go home with [facility] therapies.  If patient remains in the hospital, I will check back on Monday for progress.  Call me for questions.  [phone]

## 2018-07-24 NOTE — Progress Notes (Signed)
[name] will follow pt with ID team to support home infusion pharmacy services as needed at DC.   If patient discharges after hours, please call [phone].   Emma Lewis 07/24/2018, 1:57 PM

## 2018-07-24 NOTE — Progress Notes (Signed)
   Subjective:    Patient reports pain as mild.  Weakness, still needs assist to get to bedside commode. Better PO's.   Increased to WBAT  Objective: Vital signs in last 24 hours: Temp:  [98 F (36.7 C)-99.6 F (37.6 C)] 99.6 F (37.6 C) (12/27 0435) Pulse Rate:  [81-103] 103 (12/27 0435) Resp:  [17-18] 17 (12/27 0435) BP: (98-141)/(56-74) 141/74 (12/27 0435) SpO2:  [94 %-95 %] 94 % (12/27 0435)  Intake/Output from previous day: 12/26 0701 - 12/27 0700 In: 1413.8 [P.O.:1200; I.V.:213.8] Out: 1450 [Urine:1450] Intake/Output this shift: No intake/output data recorded.  Recent Labs    07/21/18 1000 07/23/18 0440 07/24/18 0304  HGB 7.7* 10.0* 9.9*   Recent Labs    07/23/18 0440 07/24/18 0304  WBC 11.8* 11.8*  RBC 3.91 3.76*  HCT 31.9* 31.2*  PLT 319 311   Recent Labs    07/21/18 1000  NA 137  K 3.2*  CL 105  CO2 20*  BUN 12  CREATININE 0.61  GLUCOSE 180*  CALCIUM 9.2   No results for input(s): LABPT, INR in the last 72 hours.  Neurologically intact LL equal.   Sensation to LE intact. Still right arm problems with PICC line.  Left hip incision  No drainage.  No results found.  Assessment/Plan:    Up with therapy  Rehab Consult FL2 request.  Check with ID and see if we can switch to PO ABX and get PICC line out that is bothering her arm.  WBC remains elevated and also sed rate at 94. Was 122 1 month ago. CRP is 14.6 and one month ago 9.5. will check with ID for recommendations.  D/c night dose norco and d/c robaxin.  Having some problems with headache likely from decrease meds.  Still not able to go home since she cannot mobilize to bedside commode.    Emma Lewis 07/24/2018, 9:03 AM

## 2018-07-24 NOTE — Consult Note (Signed)
Physical Medicine and Rehabilitation Consult   Reason for Consult: Debility Referring Physician: Dr. Lorin Mercy    HPI: Emma Lewis is a 67 y.o. 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 She was admitted on 07/21/18 with decreased ROM of right arm, chest and back pain, weakness as well as difficulty waking. She was found to be anemic and treated with 2 units PRBC and started on Norco for pain control.  CXR with question of LLE PNA.  MRI thoracic and lumbar spine done and were negative for fracture or acute changes.  She  continues to complain of diffuse chest pain, headaches, numbness RUE and decreased sensation BLE from knees down.  Therapy ongoing and patient advanced to FWB LLE but continues to be limited by debility.  CIR recommended by MD due to functional decline.     Review of Systems  Constitutional: Positive for chills. Negative for fever.  HENT: Negative for hearing loss and tinnitus.   Eyes: Negative for blurred vision and double vision.  Respiratory: Negative for cough and shortness of breath.   Cardiovascular: Positive for chest pain.  Gastrointestinal: Negative for constipation, diarrhea, heartburn, nausea and vomiting.  Genitourinary: Negative for dysuria, frequency and urgency.  Musculoskeletal: Positive for back pain and joint pain.  Skin: Negative for itching and rash.  Neurological: Positive for sensory change (RUE for weeks since PICC placement. Numbness BLE from knees down and left thigh), focal weakness (BLE from knees down.), weakness and headaches (for past 2 weeks. ).  Psychiatric/Behavioral: The patient is nervous/anxious.      Past Medical History:  Diagnosis Date  . Arthritis    "all over my body" (04/29/2018)    . Chronic lower back pain   . Fatty liver   . Fever blister    Takes Acyclovir  . GERD (gastroesophageal reflux disease)   . Hyperlipemia   . Hypothyroidism   . Migraines   . Osteopenia   . Pneumonia    "once; years ago" (04/29/2018)  . PONV (postoperative nausea and vomiting)   . Pre-diabetes   . Seasonal allergies   . Vertigo     Past Surgical History:  Procedure Laterality Date  . ANTERIOR CERVICAL DECOMP/DISCECTOMY FUSION N/A 02/09/2014   Procedure: ANTERIOR CERVICAL DECOMPRESSION/DISCECTOMY FUSION 2 LEVELS;  Surgeon: [name], MD;  Location: [place];  Service: Orthopedics;  Laterality: N/A;  C4-5, C5-6 Anterior Cervical Discectomy and Fusion, Allograft, Plate  . BACK SURGERY    . CARPAL TUNNEL RELEASE Right 2018  . CATARACT EXTRACTION W/ INTRAOCULAR LENS  IMPLANT, BILATERAL    . COLONOSCOPY    . FIXATION KYPHOPLASTY LUMBAR SPINE  X 2  . HIP ARTHROPLASTY Left 2011  . INCISION AND DRAINAGE HIP Left 06/19/2018   Procedure: IRRIGATION AND DEBRIDEMENT LEFT HIP HEMATOMA ANTIBIOTIC BEADS, APPLICATION ON WOUND VAC;  Surgeon: [name], MD;  Location: [place];  Service: Orthopedics;  Laterality: Left;  . JOINT REPLACEMENT    . KNEE ARTHROSCOPY Right   . ORIF PERIPROSTHETIC FRACTURE Left 06/01/2018   Procedure: OPEN REDUCTION INTERNAL FIXATION (ORIF) PERIPROSTHETIC FEMUR FRACTURE;  Surgeon: [name], MD;  Location: [place];  Service: Orthopedics;  Laterality: Left;  . PATELLA FRACTURE SURGERY Right ~ 1990   "put metal in"  .  PATELLA HARDWARE REMOVAL Right    "took the metal out"  . REVISION TOTAL HIP ARTHROPLASTY Left 04/29/2018  . ROBOTIC ASSISTED BILATERAL SALPINGO OOPHERECTOMY Bilateral 08/01/2015   Procedure: ROBOTIC ASSISTED BILATERAL SALPINGO OOPHORECTOMY;  Surgeon: Everitt Amber, MD;  Location: [facility];  Service: Gynecology;  Laterality: Bilateral;  . SHOULDER OPEN ROTATOR CUFF REPAIR Right    3 TOTAL  . SHOULDER SURGERY Right   . TOTAL HIP ARTHROPLASTY Right 01/21/2018    Procedure: RIGHT TOTAL HIP ARTHROPLASTY DIRECT ANTERIOR;  Surgeon: [name], MD;  Location: [place];  Service: Orthopedics;  Laterality: Right;  . TOTAL HIP REVISION Left 06/10/2016   Procedure: TOTAL HIP REVISION ARTHROPLASTY;  Surgeon: Rod Can, MD;  Location: [place];  Service: Orthopedics;  Laterality: Left;  . TOTAL HIP REVISION Left 04/29/2018   Procedure: left total hip arthroplasty revision posterior approach;  Surgeon: [name], MD;  Location: [place];  Service: Orthopedics;  Laterality: Left;  . WRIST GANGLION EXCISION Left 1970s    Family History  Problem Relation Age of Onset  . Lymphoma Mother     Social History:  Married. Independent prior to decline a few weeks ago. She reports that she has never smoked. She has never used smokeless tobacco. She reports that she does not drink any alcohol.  She reports that she does not use drugs.     Allergies  Allergen Reactions  . Sulfa Antibiotics Other (See Comments)     Childhood allergy Mother said "I liked to have died"  . Fenofibrate Diarrhea    Reaction while taking with magnesium    Medications Prior to Admission  Medication Sig Dispense Refill  . acyclovir (ZOVIRAX) 400 MG tablet Take 1 tablet (400 mg total) by mouth 2 (two) times daily. (Patient taking differently: Take 400 mg by mouth See admin instructions. Take one tablet (400 mg) by mouth daily, may increase to one tablet (400 mg) twice daily as needed for fever blisters.) 60 tablet 0  . aspirin EC 325 MG EC tablet Take 1 tablet (325 mg total) by mouth daily with breakfast. 30 tablet 0  . Calcium Carb-Cholecalciferol (CALCIUM 600 + D PO) Take 1 tablet by mouth 3 (three) times daily.     . Carboxymethylcellulose Sodium (THERATEARS) 0.25 % SOLN Place 1 drop into both eyes 2 (two) times daily as needed (dry eyes).    . cefTRIAXone (ROCEPHIN) IVPB Inject 2 g into the vein daily. Indication:  Deep tissue infection L hip Last Day of Therapy:  08/01/18 Labs - Once  weekly:  CBC/D and BMP, Labs - Every other week:  ESR and CRP 40 Units 0  . Cholecalciferol (VITAMIN D3) 2000 units TABS Take 2,000 Units by mouth 2 (two) times daily.     . clobetasol cream (TEMOVATE) 4.59 % Apply 1 application topically 2 (two) times daily. (Patient taking differently: Apply 1 application topically daily as needed (irritation). ) 30 g 1  . HYDROcodone-acetaminophen (NORCO) 5-325 MG tablet Take 1-2 tablets by mouth every 6 (six) hours as needed for moderate pain. 30 tablet 0  . ibuprofen (ADVIL,MOTRIN) 200 MG tablet Take 800 mg by mouth every 6 (six) hours as needed for mild pain.    Marland Kitchen levothyroxine (SYNTHROID, LEVOTHROID) 100 MCG tablet Take 1 tablet (100 mcg total) by mouth daily before breakfast. 30 tablet 0  . meclizine (ANTIVERT) 25 MG tablet Take 1 tablet (25 mg total) by mouth 3 (three) times daily as needed for dizziness. Vertigo. Also available OTC (  Patient taking differently: Take 25 mg by mouth daily as needed (vertigo). ) 90 tablet 3  . methocarbamol (ROBAXIN) 500 MG tablet Take 1 tablet (500 mg total) by mouth every 8 (eight) hours as needed for muscle spasms. 40 tablet 0  . nystatin-triamcinolone (MYCOLOG II) cream Apply 1 application topically 2 (two) times daily as needed (irritation).   3  . Omega-3 Fatty Acids (OMEGA-3 FISH OIL PO) Take 1 capsule by mouth 3 (three) times daily.     Marland Kitchen omeprazole (PRILOSEC OTC) 20 MG tablet Take 10 mg by mouth daily.    . simvastatin (ZOCOR) 40 MG tablet Take 1 tablet (40 mg total) by mouth at bedtime. 30 tablet 0  . Specialty Vitamins Products (BIOTIN PLUS KERATIN) 10000-100 MCG-MG TABS Take 1 tablet by mouth 3 (three) times daily.     . feeding supplement, ENSURE ENLIVE, (ENSURE ENLIVE) LIQD Take 237 mLs by mouth 2 (two) times daily between meals. 237 mL 12  . methocarbamol (ROBAXIN) 500 MG tablet Take 1 tablet (500 mg total) by mouth every 6 (six) hours as needed for muscle spasms. (Patient not taking: Reported on 07/21/2018) 60  tablet 0    Home: Home Living Family/patient expects to be discharged to:: Private residence Living Arrangements: Spouse/significant other(small dog) Available Help at Discharge: Family, Available 24 hours/day Type of Home: House Home Access: Ramped entrance Home Layout: One level Bathroom Shower/Tub: Multimedia programmer: Handicapped height Bathroom Accessibility: Yes Home Equipment: Environmental consultant - 2 wheels, [place] - single point, Bedside commode, Crutches, Shower seat - built in, Wheelchair - manual  Functional History: Prior Function Level of Independence: Needs assistance Gait / Transfers Assistance Needed: Days leading to admission, husband assisting LLE into/out of bed, assisting pt to stand with RW, pt limited to basically transfers to BSC/wheelchair, unable to walk secondary to leg weakness ADL's / Homemaking Assistance Needed: Husband assisting with ADLs; sponge bathes Functional Status:  Mobility: Bed Mobility Overal bed mobility: Needs Assistance Bed Mobility: Sit to Supine Supine to sit: Mod assist, HOB elevated Sit to supine: Max assist General bed mobility comments: max A for BLE back into bed, increased time and effort with vc required Transfers Overall transfer level: Needs assistance Equipment used: Rolling walker (2 wheeled) Transfers: Sit to/from Stand, [employer] Transfers Sit to Stand: Mod assist, From elevated surface, +2 safety/equipment Stand pivot transfers: Mod assist(HEAVY mod) General transfer comment: vc for hand placement, rocking used to initiate standing, max encouragement needed Ambulation/Gait Ambulation/Gait assistance: Min assist, +2 safety/equipment Gait Distance (Feet): (~4-5) Assistive device: Rolling walker (2 wheeled) Gait Pattern/deviations: Step-to pattern, Decreased weight shift to left, Antalgic, Trunk flexed, Decreased step length - right, Decreased step length - left, Decreased dorsiflexion - left General Gait Details: cues for  posture, weight shifting, L step (physical assist for inital step), and then for use of bilat UE for R step Gait velocity: Decreased Gait velocity interpretation: <1.31 ft/sec, indicative of household ambulator    ADL: ADL Overall ADL's : Needs assistance/impaired Eating/Feeding: Modified independent, Sitting Grooming: Wash/dry hands, Wash/dry face, Set up, Sitting Grooming Details (indicate cue type and reason): unable to maintain standing for grooming activities Upper Body Bathing: Set up, Sitting Lower Body Bathing: Moderate assistance, With caregiver independent assisting, Sitting/lateral leans Lower Body Bathing Details (indicate cue type and reason): Pt sponge bathes Upper Body Dressing : Set up, Sitting Lower Body Dressing: Moderate assistance, With caregiver independent assisting, Sit to/from stand Lower Body Dressing Details (indicate cue type and reason): Pt's husband has  been assisting at home,  Toilet Transfer: Moderate assistance, Stand-pivot, RW Toilet Transfer Details (indicate cue type and reason): simulated through return to bed - right side leading Toileting- Clothing Manipulation and Hygiene: Moderate assistance Functional mobility during ADLs: Moderate assistance, +2 for safety/equipment, Rolling walker(SPt only this session)  Cognition: Cognition Overall Cognitive Status: Within Functional Limits for tasks assessed Orientation Level: Oriented X4 Cognition Arousal/Alertness: Awake/alert Behavior During Therapy: WFL for tasks assessed/performed Overall Cognitive Status: Within Functional Limits for tasks assessed General Comments: WFL for simple tasks  Blood pressure (!) 141/74, pulse (!) 103, temperature 99.6 F (37.6 C), temperature source Oral, resp. rate 17, height 5' 5"  (1.651 m), weight 88.5 kg, SpO2 94 %. Physical Exam  Constitutional: She is oriented to person, place, and time. She appears well-developed. No distress.  HENT:  Head: Normocephalic and  atraumatic.  Eyes: Pupils are equal, round, and reactive to light. EOM are normal.  Neck: Normal range of motion.  Cardiovascular: Normal rate.  Respiratory: Effort normal.  GI: Soft.  Musculoskeletal:     Comments: Mild cervical discomfort with palpation. Right arm limited by pain from PICC. Left hip tender to palpation and ROM, associated swelling  Neurological: She is alert and oriented to person, place, and time. No cranial nerve deficit.  RUE limited by pain. LUE 5/5. RLE 4/5. LLE: 1/5 HF, 2-KE and 4/5 ADF/PF. No sensory findings  Psychiatric: She has a normal mood and affect. Her behavior is normal. Judgment and thought content normal.    Results for orders placed or performed during the hospital encounter of 07/21/18 (from the past 24 hour(s))  CBC     Status: Abnormal   Collection Time: 07/24/18  3:04 AM  Result Value Ref Range   WBC 11.8 (H) 4.0 - 10.5 K/uL   RBC 3.76 (L) 3.87 - 5.11 MIL/uL   Hemoglobin 9.9 (L) 12.0 - 15.0 g/dL   HCT 31.2 (L) 36.0 - 46.0 %   MCV 83.0 80.0 - 100.0 fL   MCH 26.3 26.0 - 34.0 pg   MCHC 31.7 30.0 - 36.0 g/dL   RDW 16.3 (H) 11.5 - 15.5 %   Platelets 311 150 - 400 K/uL   nRBC 0.0 0.0 - 0.2 %   No results found.   Assessment/Plan: Diagnosis:  Hx of recent left TH revision after subluxation. Course complicated by wound infection. Pt with ongoing pain and debility after prolonged course. 1. Does the need for close, 24 hr/day medical supervision in concert with the patient's rehab needs make it unreasonable for this patient to be served in a less intensive setting? Yes 2. Co-Morbidities requiring supervision/potential complications: OA, ACDF, chronic LBP 3. Due to bladder management, bowel management, safety, skin/wound care, disease management, medication administration, pain management and patient education, does the patient require 24 hr/day rehab nursing? Yes 4. Does the patient require coordinated care of a physician, rehab nurse, PT (1-2  hrs/day, 5 days/week) and OT (1-2 hrs/day, 5 days/week) to address physical and functional deficits in the context of the above medical diagnosis(es)? Yes Addressing deficits in the following areas: balance, endurance, locomotion, strength, transferring, bowel/bladder control, bathing, dressing, feeding, grooming, toileting and psychosocial support 5. Can the patient actively participate in an intensive therapy program of at least 3 hrs of therapy per day at least 5 days per week? Yes 6. The potential for patient to make measurable gains while on inpatient rehab is good 7. Anticipated functional outcomes upon discharge from inpatient rehab are modified independent and supervision  with PT, modified independent and supervision with OT, n/a with SLP. 8. Estimated rehab length of stay to reach the above functional goals is: 7-11 days 9. Anticipated D/C setting: Home 10. Anticipated post D/C treatments: [place] therapy 11. Overall Rehab/Functional Prognosis: excellent  RECOMMENDATIONS: This patient's condition is appropriate for continued rehabilitative care in the following setting: CIR Patient has agreed to participate in recommended program. Potentially Note that insurance prior authorization may be required for reimbursement for recommended care.  Comment: Pt has concerns over whether she can tolerate intensity of therapy. (she has been with [place] before.) She and husband will discuss today, Rehab Admissions Coordinator to follow up.  Thanks,  Meredith Staggers, MD, Mellody Drown  I have personally performed a face to face diagnostic evaluation of this patient. Additionally, I have reviewed and concur with the physician assistant's documentation above.    Bary Leriche, PA-C 07/24/2018

## 2018-07-24 NOTE — Progress Notes (Signed)
Patient ID: Emma Lewis, female   DOB: Nov 16, 1950, 67 y.o.   MRN: [id number] Appreciate Rehab consult and Infectious disease consult. , thank you for help in her care.

## 2018-07-24 NOTE — Progress Notes (Signed)
Physical Therapy Treatment Patient Details Name: Emma Lewis MRN: [id number] DOB: Jul 22, 1951 Today's Date: 07/24/2018    History of Present Illness Pt is a 67 y.o. female admitted 07/21/18 with worsening BLE weakness and fatigue since being home after recent admission for L femur fx s/p ORIF (05/2018) with subsequent admission for L THA revision (04/29/18). Imaging of L femur xray with no acute changes. MRI shows no cord compression; unchanged chronic compression fxs (from 2018). PMH includes vertigo, osteopenia, chronic lower back pain, migraines, arthritis.    PT Comments    Patient seen for mobility progression. Pt is making very gradual progress toward PT goals. Pt tolerated gait training for 8 ft X 2 trials with seated rest break due to fatigue. Pt is still having difficulty progressing L LE and presents with limited L knee ROM and tends to keep L LE internally rotated for comfort. Given pt's current mobility level/activity tolerance recommend post acute rehab for skilled PT services to maximize independence and safety with mobility.     Follow Up Recommendations  SNF;Supervision for mobility/OOB     Equipment Recommendations  None recommended by PT    Recommendations for Other Services       Precautions / Restrictions Precautions Precautions: Fall Precaution Comments: PICC line Restrictions Weight Bearing Restrictions: Yes LLE Weight Bearing: Weight bearing as tolerated    Mobility  Bed Mobility Overal bed mobility: Needs Assistance Bed Mobility: Supine to Sit     Supine to sit: Mod assist;HOB elevated     General bed mobility comments: assist to bring L LE and hips to EOB with use of bed pad; cues for sequencing and technique; use of rail  Transfers Overall transfer level: Needs assistance Equipment used: Rolling walker (2 wheeled) Transfers: Sit to/from Stand Sit to Stand: Mod assist;+2 physical assistance;+2 safety/equipment         General transfer  comment: assist to power up into standing from EOB and recliner; cues for positioning especially of L LE prior to standing and for safe hand placement; pt tends to keep L LE internally rotated and with limited L knee ROM  Ambulation/Gait Ambulation/Gait assistance: Min assist;+2 safety/equipment Gait Distance (Feet): (8 ft X 2 trials with seated rest break) Assistive device: Rolling walker (2 wheeled) Gait Pattern/deviations: Step-to pattern;Decreased stance time - left;Decreased step length - left;Decreased step length - right;Decreased dorsiflexion - left;Decreased weight shift to left Gait velocity: slow Gait velocity interpretation: <1.31 ft/sec, indicative of household ambulator General Gait Details: cues for posture, proximity to RW, and sequencing; seated break required due to fatigue   Stairs             Wheelchair Mobility    Modified Rankin (Stroke Patients Only)       Balance Overall balance assessment: Needs assistance Sitting-balance support: No upper extremity supported;Feet supported Sitting balance-Leahy Scale: Fair     Standing balance support: Bilateral upper extremity supported Standing balance-Leahy Scale: Poor Standing balance comment: Heavy reliance on BUE support                            Cognition Arousal/Alertness: Awake/alert Behavior During Therapy: WFL for tasks assessed/performed Overall Cognitive Status: Within Functional Limits for tasks assessed                                 General Comments: increased time for all tasks due to being focused  on pain      Exercises      General Comments General comments (skin integrity, edema, etc.): pt educated to work on L knee flexion with 10 second holds for increased knee ROM       Pertinent Vitals/Pain Pain Assessment: Faces Faces Pain Scale: Hurts even more Pain Location: LLE and R hand Pain Descriptors / Indicators: Sore;Grimacing;Guarding Pain  Intervention(s): Limited activity within patient's tolerance;Monitored during session;Premedicated before session;Repositioned    Home Living                      Prior Function            PT Goals (current goals can now be found in the care plan section) Acute Rehab PT Goals Patient Stated Goal: Get strength back so I can do for myself and not have to rely on husband for everything Progress towards PT goals: Progressing toward goals    Frequency    Min 3X/week      PT Plan Current plan remains appropriate    Co-evaluation              AM-PAC PT "6 Clicks" Mobility   Outcome Measure  Help needed turning from your back to your side while in a flat bed without using bedrails?: A Lot Help needed moving from lying on your back to sitting on the side of a flat bed without using bedrails?: A Lot Help needed moving to and from a bed to a chair (including a wheelchair)?: A Little Help needed standing up from a chair using your arms (e.g., wheelchair or bedside chair)?: A Lot Help needed to walk in hospital room?: A Little Help needed climbing 3-5 steps with a railing? : Total 6 Click Score: 13    End of Session Equipment Utilized During Treatment: Gait belt Activity Tolerance: Patient limited by pain;Patient limited by fatigue Patient left: in chair;with call bell/phone within reach Nurse Communication: Mobility status PT Visit Diagnosis: Other abnormalities of gait and mobility (R26.89);Muscle weakness (generalized) (M62.81)     Time: 1007-1040 PT Time Calculation (min) (ACUTE ONLY): 33 min  Charges:  $Gait Training: 8-22 mins $Therapeutic Activity: 8-22 mins                     Earney Navy, PTA Acute Rehabilitation Services Pager: [phone] Office: [phone]     Darliss Cheney 07/24/2018, 11:05 AM

## 2018-07-25 LAB — IRON AND TIBC
Iron: 19 ug/dL — ABNORMAL LOW (ref 28–170)
Saturation Ratios: 6 % — ABNORMAL LOW (ref 10.4–31.8)
TIBC: 308 ug/dL (ref 250–450)
UIBC: 289 ug/dL

## 2018-07-25 MED ORDER — CIPROFLOXACIN HCL 750 MG PO TABS: 750.0000 mg | ORAL_TABLET | Freq: Two times a day (BID) | ORAL | 0 refills | Status: DC

## 2018-07-25 MED ORDER — CIPROFLOXACIN HCL 500 MG PO TABS: 750.0000 mg | ORAL_TABLET | Freq: Two times a day (BID) | ORAL | Status: DC

## 2018-07-25 MED ORDER — FERROUS SULFATE 325 (65 FE) MG PO TABS: 325.0000 mg | ORAL_TABLET | Freq: Two times a day (BID) | ORAL | 2 refills | Status: DC

## 2018-07-25 MED ORDER — DOCUSATE SODIUM 100 MG PO CAPS: 100.0000 mg | ORAL_CAPSULE | Freq: Two times a day (BID) | ORAL | 0 refills | Status: DC

## 2018-07-25 NOTE — Care Management Note (Signed)
Case Management Note  Patient Details  Name: Emma Lewis MRN: [id number] Date of Birth: 1951/07/20  Subjective/Objective:       Pt to d/c home with husband.  Pt has had previous surgeries, so has all necessary DME and has used [facility] for [facility] needs.  She would like to use [facility] again.               Action/Plan: Medicare list prepared for patient and placed on chart.   Call placed to [facility]'s on-call service.  Facesheet, [place] orders, and pertinent notes faxed to [phone] as requested.    Expected Discharge Date:  07/25/18               Expected Discharge Plan:  [place]  In-House Referral:  Clinical Social Work  Discharge planning Services  CM Consult  Post Acute Care Choice:  Home Health Choice offered to:     DME Arranged:    DME Agency:     HH Arranged:  PT [place]:  [place]  Status of Service:  Completed, signed off  If discussed at [place] of Stay Meetings, dates discussed:    Additional Comments:  Claudie Leach, RN 07/25/2018, 4:24 PM

## 2018-07-25 NOTE — Progress Notes (Signed)
[facility]    Date of Admission:  07/21/2018   Total days of antibiotics 35   ID: GENETTA FIERO is a 67 y.o. female with  Proteus deep wound infection s/p I x D on ceftiraoxne Principal Problem:   Proteus mirabilis infection Active Problems:   Ambulatory dysfunction   Weakness of both legs   Symptomatic anemia    Subjective: Afebrile. Underwent CT that did now show fluid collection suggestive of abscess.  Medications:  . calcium-vitamin D  1 tablet Oral BID  . docusate sodium  100 mg Oral BID  . feeding supplement (PRO-STAT SUGAR FREE 64)  30 mL Oral BID  . ferrous sulfate  325 mg Oral BID WC  . lactose free nutrition  237 mL Oral TID WC  . levothyroxine  100 mcg Oral Q0600  . multivitamin  1 tablet Oral Daily  . pantoprazole  40 mg Oral Daily  . simvastatin  40 mg Oral q1800    Objective: Vital signs in last 24 hours: Temp:  [98.1 F (36.7 C)-98.6 F (37 C)] 98.1 F (36.7 C) (12/28 0509) Pulse Rate:  [88-90] 90 (12/28 0509) Resp:  [16] 16 (12/28 0509) BP: (101-108)/(56-65) 101/56 (12/28 0509) SpO2:  [91 %-92 %] 91 % (12/28 0509)     Lab Results Recent Labs    07/23/18 0440 07/24/18 0304  WBC 11.8* 11.8*  HGB 10.0* 9.9*  HCT 31.9* 31.2*   Lab Results  Component Value Date   ESRSEDRATE 94 (H) 07/21/2018    Microbiology: Blood cx ngtd Studies/Results: Ct Hip Left W Contrast  Addendum Date: 07/24/2018   ADDENDUM REPORT: 07/24/2018 20:21 ADDENDUM: Noted is also made of lower lumbar vertebral augmentation of the included L3 through L5 vertebral bodies. Electronically Signed   By: Ashley Royalty M.D.   On: 07/24/2018 20:21   Result Date: 07/24/2018 CLINICAL DATA:  History of hematoma following hip surgery with swelling and pain in the setting of anemia. Rule out fluid collection at the hip. EXAM: CT OF THE LOWER LEFT EXTREMITY WITH CONTRAST TECHNIQUE: Multidetector CT imaging of the lower left extremity was performed according to  the standard protocol following intravenous contrast administration. COMPARISON:  100 cc Omnipaque 300 CONTRAST:  131mL OMNIPAQUE IOHEXOL 300 MG/ML  SOLN FINDINGS: Bones/Joint/Cartilage There is is a long-stem uncemented left total hip arthroplasty noted without hardware failure. Lateral plate and cerclage as well as screw fixation of the proximal 2/3 of the left femur is also noted without complicating features. Subtle lucency along the anterior acetabular wall, series 76 through 78 are identified possibly related to prior trauma. This likely represents an incomplete fracture. No acute hardware failure or malalignment. Small moderate joint effusion is suggested. Ligaments Suboptimally assessed by CT. Muscles and Tendons Low-density postoperative fluid measuring 6.8 x 5.7 x 8.2 cm in transverse by AP by craniocaudad dimension is seen in the expected location of the gluteus medius and lateral subcutaneous soft tissues without definite enhancement to suggest abscess. Muscle atrophy about the left hip. Soft tissues IMPRESSION: 1. Status post long-stem uncemented left total hip arthroplasty without hardware failure. No acute hardware failure or malalignment. 2. Subtle lucency along the left anterior acetabular wall may be related to prior trauma. A nondisplaced fracture is not entirely excluded and is age indeterminate. 3. Low-density postoperative fluid in the expected location of the gluteus medius and subcutaneous soft tissues lateral to the left hip without definite enhancement to suggest abscess. Electronically Signed: By: Ashley Royalty  M.D. On: 07/24/2018 20:02     Assessment/Plan:   67 y.o. female s/p I&D following infected hematoma surrounding L total hip prosthesis now on antibiotic day 35 with ceftriaxone for proteus. Readmitted for weakness in the setting of symptomatic anemia. Anemia work up reveals iron stores are low   Plan: 1. Continue ceftriaxone for now while inpatient  2. Iron deficiency  anemia = will do an infusion of fereheme so that can help with repleting her iron stores and then will not need to do oral iron supplrementation/less drug interaction 3. Leave PICC for inpatient access and will D/C when primary team feels ready for discharge.  4. At discharge would provide with 4 weeks of Ciprofloxacin 750 mg BID vs oral cephalexin 500mg  WID.  [facility] for Infectious Diseases Cell: [phone] Pager: [phone]  07/25/2018, 2:46 PM

## 2018-07-25 NOTE — Discharge Instructions (Signed)
Walk daily. Eat everything on your plate every meal plus the ensure one can twice daily. Gradually increase walking distance daily. Change dressing as needed.

## 2018-07-25 NOTE — Progress Notes (Signed)
Pt discussed discharge with Dr. Lorin Mercy. Dr. Lorin Mercy changed dressing to L hip. Given oral and written discharge instructions including written scripts. Care management called to arrange HHPT. Pt has all necessary equipment at home. PICC/IV nurse ordered to remove PICC line for discharge to home.

## 2018-07-25 NOTE — Progress Notes (Addendum)
Subjective:    Patient reports pain as mild.    Objective: Vital signs in last 24 hours: Temp:  [98.1 F (36.7 C)-98.6 F (37 C)] 98.1 F (36.7 C) (12/28 0509) Pulse Rate:  [88-90] 90 (12/28 0509) Resp:  [16] 16 (12/28 0509) BP: (101-108)/(56-65) 101/56 (12/28 0509) SpO2:  [91 %-92 %] 91 % (12/28 0509)  Intake/Output from previous day: 12/27 0701 - 12/28 0700 In: 1020 [P.O.:1020] Out: -  Intake/Output this shift: Total I/O In: 600 [P.O.:600] Out: 900 [Urine:900]  Recent Labs    07/23/18 0440 07/24/18 0304  HGB 10.0* 9.9*   Recent Labs    07/23/18 0440 07/24/18 0304  WBC 11.8* 11.8*  RBC 3.91 3.76*  HCT 31.9* 31.2*  PLT 319 311   No results for input(s): NA, K, CL, CO2, BUN, CREATININE, GLUCOSE, CALCIUM in the last 72 hours. No results for input(s): LABPT, INR in the last 72 hours.  Neurologically intact Ct Hip Left W Contrast  Addendum Date: 07/24/2018   ADDENDUM REPORT: 07/24/2018 20:21 ADDENDUM: Noted is also made of lower lumbar vertebral augmentation of the included L3 through L5 vertebral bodies. Electronically Signed   By: Ashley Royalty M.D.   On: 07/24/2018 20:21   Result Date: 07/24/2018 CLINICAL DATA:  History of hematoma following hip surgery with swelling and pain in the setting of anemia. Rule out fluid collection at the hip. EXAM: CT OF THE LOWER LEFT EXTREMITY WITH CONTRAST TECHNIQUE: Multidetector CT imaging of the lower left extremity was performed according to the standard protocol following intravenous contrast administration. COMPARISON:  100 cc Omnipaque 300 CONTRAST:  139mL OMNIPAQUE IOHEXOL 300 MG/ML  SOLN FINDINGS: Bones/Joint/Cartilage There is is a long-stem uncemented left total hip arthroplasty noted without hardware failure. Lateral plate and cerclage as well as screw fixation of the proximal 2/3 of the left femur is also noted without complicating features. Subtle lucency along the anterior acetabular wall, series 76 through 78 are  identified possibly related to prior trauma. This likely represents an incomplete fracture. No acute hardware failure or malalignment. Small moderate joint effusion is suggested. Ligaments Suboptimally assessed by CT. Muscles and Tendons Low-density postoperative fluid measuring 6.8 x 5.7 x 8.2 cm in transverse by AP by craniocaudad dimension is seen in the expected location of the gluteus medius and lateral subcutaneous soft tissues without definite enhancement to suggest abscess. Muscle atrophy about the left hip. Soft tissues IMPRESSION: 1. Status post long-stem uncemented left total hip arthroplasty without hardware failure. No acute hardware failure or malalignment. 2. Subtle lucency along the left anterior acetabular wall may be related to prior trauma. A nondisplaced fracture is not entirely excluded and is age indeterminate. 3. Low-density postoperative fluid in the expected location of the gluteus medius and subcutaneous soft tissues lateral to the left hip without definite enhancement to suggest abscess. Electronically Signed: By: Ashley Royalty M.D. On: 07/24/2018 20:02    Assessment/Plan:    Plan   Pt has problems eating  Hospital food. Has been able to make it to bathroom on her own with walker. She now wants to go home and husband at bedside. They both feel PO intake would be better with home foot plus protein supplements.  Moving better post transfusion.  Protein malnurishment remains problem.   I have encouraged her to go to rehab but she refuses and wants to go home with HHPT. Will discharge with HHPT. Office one week. PO Cipro as per ID recommendations. Off norco and robaxin. Home with feso4.  Emma Lewis 07/25/2018, 3:24 PM

## 2018-07-25 NOTE — Progress Notes (Signed)
PICC line removed per order. PICC intact. Site covered with vaseline gauze and gauze and is clean, dry, intact. Patient and RN aware patient to stay in bed for 30 minutes, until 1650. Patient aware not to get dressing wet or take off for 24 hours.

## 2018-07-26 LAB — CULTURE, BLOOD (ROUTINE X 2)
Culture: NO GROWTH
Culture: NO GROWTH
Special Requests: ADEQUATE
Special Requests: ADEQUATE

## 2018-07-27 ENCOUNTER — Inpatient Hospital Stay: Payer: Medicare Other | Admitting: Infectious Diseases

## 2018-07-27 ENCOUNTER — Telehealth: Payer: Self-pay

## 2018-07-27 DIAGNOSIS — T84031D Mechanical loosening of internal left hip prosthetic joint, subsequent encounter: Secondary | ICD-10-CM | POA: Diagnosis not present

## 2018-07-27 DIAGNOSIS — N189 Chronic kidney disease, unspecified: Secondary | ICD-10-CM | POA: Diagnosis not present

## 2018-07-27 DIAGNOSIS — H8110 Benign paroxysmal vertigo, unspecified ear: Secondary | ICD-10-CM | POA: Diagnosis not present

## 2018-07-27 DIAGNOSIS — M170 Bilateral primary osteoarthritis of knee: Secondary | ICD-10-CM | POA: Diagnosis not present

## 2018-07-27 DIAGNOSIS — I129 Hypertensive chronic kidney disease with stage 1 through stage 4 chronic kidney disease, or unspecified chronic kidney disease: Secondary | ICD-10-CM | POA: Diagnosis not present

## 2018-07-27 DIAGNOSIS — T8452XD Infection and inflammatory reaction due to internal left hip prosthesis, subsequent encounter: Secondary | ICD-10-CM | POA: Diagnosis not present

## 2018-07-27 NOTE — Telephone Encounter (Signed)
-----   Message from [place] Callas, NP sent at 07/27/2018  2:34 PM EST ----- Regarding: call for appt I tried to call Emma Lewis to let her know of an appointment I have for her January 9th @ 9:30am  Can you please try to call her again to let her know of appt? Thank you   ----- Message ----- From: Carlyle Basques, MD Sent: 07/26/2018   9:25 AM EST To: [place] Callas, NP, Carlyle Basques, MD  Can we see that she has appt with you or myself in the next 1-2 wk. Also I will call her on Monday to see if she is separating her oral iron from cipro dose

## 2018-07-27 NOTE — Telephone Encounter (Signed)
Per [place] called patient to notify patient of appointment scheduled. Patient refused appointment stating that she doesn't need the appointment, Dr. Lorin Mercy has been giving her the antibiotics, and that if Dr.Yates wanted something from [place] he would give [place] a call.   Lenore Cordia, [place]

## 2018-07-30 DIAGNOSIS — M170 Bilateral primary osteoarthritis of knee: Secondary | ICD-10-CM | POA: Diagnosis not present

## 2018-07-30 DIAGNOSIS — T8452XD Infection and inflammatory reaction due to internal left hip prosthesis, subsequent encounter: Secondary | ICD-10-CM | POA: Diagnosis not present

## 2018-07-30 DIAGNOSIS — H8110 Benign paroxysmal vertigo, unspecified ear: Secondary | ICD-10-CM | POA: Diagnosis not present

## 2018-07-30 DIAGNOSIS — T84031D Mechanical loosening of internal left hip prosthetic joint, subsequent encounter: Secondary | ICD-10-CM | POA: Diagnosis not present

## 2018-07-30 DIAGNOSIS — N189 Chronic kidney disease, unspecified: Secondary | ICD-10-CM | POA: Diagnosis not present

## 2018-07-30 DIAGNOSIS — I129 Hypertensive chronic kidney disease with stage 1 through stage 4 chronic kidney disease, or unspecified chronic kidney disease: Secondary | ICD-10-CM | POA: Diagnosis not present

## 2018-08-03 DIAGNOSIS — M170 Bilateral primary osteoarthritis of knee: Secondary | ICD-10-CM | POA: Diagnosis not present

## 2018-08-03 DIAGNOSIS — H8110 Benign paroxysmal vertigo, unspecified ear: Secondary | ICD-10-CM | POA: Diagnosis not present

## 2018-08-03 DIAGNOSIS — N189 Chronic kidney disease, unspecified: Secondary | ICD-10-CM | POA: Diagnosis not present

## 2018-08-03 DIAGNOSIS — T8452XD Infection and inflammatory reaction due to internal left hip prosthesis, subsequent encounter: Secondary | ICD-10-CM | POA: Diagnosis not present

## 2018-08-03 DIAGNOSIS — I129 Hypertensive chronic kidney disease with stage 1 through stage 4 chronic kidney disease, or unspecified chronic kidney disease: Secondary | ICD-10-CM | POA: Diagnosis not present

## 2018-08-03 DIAGNOSIS — T84031D Mechanical loosening of internal left hip prosthetic joint, subsequent encounter: Secondary | ICD-10-CM | POA: Diagnosis not present

## 2018-08-03 NOTE — Discharge Summary (Addendum)
Patient ID: Emma Lewis MRN: [id number] DOB/AGE: [birth year] 68 y.o.  Admit date: 07/21/2018 Discharge date: 08/03/2018  Admission Diagnoses:  Principal Problem:   Proteus mirabilis infection Active Problems:   Ambulatory dysfunction   Weakness of both legs   Symptomatic anemia   Discharge Diagnoses:  Principal Problem:   Proteus mirabilis infection Active Problems:   Ambulatory dysfunction   Weakness of both legs   Symptomatic anemia  status post   Past Medical History:  Diagnosis Date  . Arthritis    "all over my body" (04/29/2018)  . Chronic lower back pain   . Fatty liver   . Fever blister    Takes Acyclovir  . GERD (gastroesophageal reflux disease)   . Hyperlipemia   . Hypothyroidism   . Migraines   . Osteopenia   . Pneumonia    "once; years ago" (04/29/2018)  . PONV (postoperative nausea and vomiting)   . Pre-diabetes   . Seasonal allergies   . Vertigo     Surgeries:  on * No surgery found *   Consultants: Treatment Team:  [name], MD  Discharged Condition: Improved  Hospital Course: Emma Lewis is an 68 y.o. female who was admitted 07/21/2018 for operative treatment of Proteus mirabilis infection. Patient failed conservative treatments (please see the history and physical for the specifics) and had severe unremitting pain that affects sleep, daily activities and work/hobbies.  Patient had progressive decreased ambulation the point she could not make it to the bathroom was not able to stand at home to make it to the bedside chair.  She had progressive weakness on admission albumin had dropped to 2.3 with low hemoglobin.  She received 2 units of packed cells with improved strength and work with physical therapy with weightbearing allowed on the left lower extremity.  CT scan was obtained as well as infectious disease consult, rehabilitation consult, dietary consult.  She is felt to be a good candidate for transfer to rehab but with physical therapy  patient made progression over several days with improved ambulation of the point that she decided she would prefer to go home and primarily hospital food was something she was not tolerating well.  She was on Ensure supplements.  Hemoglobin was improved posttransfusion strength was improved and she was able to make it to the bathroom using a walker on her own safely.  She was discharged home with office follow-up.  Her hip wound had mild serous drainage which decreased while she was in the hospital.  Infectious disease recommended placing her on Cipro which was added to his discharge medication.  Office follow-up will be 1 week after discharge.  She was certified stay by physical therapy the time of discharge.  CT scan of the left hip and thigh showed good position of prosthesis plate fixation and no abnormal fluid collections.  Interval healing of the fractures were noted.  Patient was given perioperative antibiotics:  Anti-infectives (From admission, onward)   Start     Dose/Rate Route Frequency Ordered Stop   07/25/18 2000  ciprofloxacin (CIPRO) tablet 750 mg  Status:  Discontinued     750 mg Oral 2 times daily 07/25/18 1535 07/25/18 2025   07/25/18 0000  ciprofloxacin (CIPRO) 750 MG tablet     750 mg Oral 2 times daily 07/25/18 1540     07/24/18 1259  acyclovir (ZOVIRAX) 200 MG capsule 400 mg  Status:  Discontinued     400 mg Oral 2 times daily PRN 07/24/18  1300 07/25/18 2025   07/22/18 1330  acyclovir (ZOVIRAX) tablet 400 mg     400 mg Oral  Once 07/22/18 1306 07/22/18 1423   07/21/18 1745  cefTRIAXone (ROCEPHIN) 2 g in sodium chloride 0.9 % 100 mL IVPB  Status:  Discontinued     2 g 200 mL/hr over 30 Minutes Intravenous Every 24 hours 07/21/18 1738 07/25/18 1535       Patient was given sequential compression devices and early ambulation to prevent DVT.   Patient benefited maximally from hospital stay and there were no complications. At the time of discharge, the patient was  urinating/moving their bowels without difficulty, tolerating a regular diet, pain is controlled with oral pain medications and they have been cleared by PT/OT.   Recent vital signs: No data found.   Recent laboratory studies: No results for input(s): WBC, HGB, HCT, PLT, NA, K, CL, CO2, BUN, CREATININE, GLUCOSE, INR, CALCIUM in the last 72 hours.  Invalid input(s): PT, 2   Discharge Medications:   Allergies as of 07/25/2018      Reactions   Sulfa Antibiotics Other (See Comments)    Childhood allergy Mother said "I liked to have died"   Fenofibrate Diarrhea   Reaction while taking with magnesium      Medication List    STOP taking these medications   cefTRIAXone  IVPB Commonly known as:  ROCEPHIN   HYDROcodone-acetaminophen 5-325 MG tablet Commonly known as:  NORCO   methocarbamol 500 MG tablet Commonly known as:  ROBAXIN     TAKE these medications   acyclovir 400 MG tablet Commonly known as:  ZOVIRAX Take 1 tablet (400 mg total) by mouth 2 (two) times daily. What changed:    when to take this  additional instructions   aspirin 325 MG EC tablet Take 1 tablet (325 mg total) by mouth daily with breakfast.   Biotin Plus Keratin 10000-100 MCG-MG Tabs Take 1 tablet by mouth 3 (three) times daily.   CALCIUM 600 + D PO Take 1 tablet by mouth 3 (three) times daily.   ciprofloxacin 750 MG tablet Commonly known as:  CIPRO Take 1 tablet (750 mg total) by mouth 2 (two) times daily.   clobetasol cream 0.05 % Commonly known as:  TEMOVATE Apply 1 application topically 2 (two) times daily. What changed:    when to take this  reasons to take this   docusate sodium 100 MG capsule Commonly known as:  COLACE Take 1 capsule (100 mg total) by mouth 2 (two) times daily.   feeding supplement (ENSURE ENLIVE) Liqd Take 237 mLs by mouth 2 (two) times daily between meals.   ferrous sulfate 325 (65 FE) MG tablet Take 1 tablet (325 mg total) by mouth 2 (two) times daily with a  meal.   ibuprofen 200 MG tablet Commonly known as:  ADVIL,MOTRIN Take 800 mg by mouth every 6 (six) hours as needed for mild pain.   levothyroxine 100 MCG tablet Commonly known as:  SYNTHROID, LEVOTHROID Take 1 tablet (100 mcg total) by mouth daily before breakfast.   meclizine 25 MG tablet Commonly known as:  ANTIVERT Take 1 tablet (25 mg total) by mouth 3 (three) times daily as needed for dizziness. Vertigo. Also available OTC What changed:    when to take this  reasons to take this  additional instructions   nystatin-triamcinolone cream Commonly known as:  MYCOLOG II Apply 1 application topically 2 (two) times daily as needed (irritation).   OMEGA-3 FISH OIL PO  Take 1 capsule by mouth 3 (three) times daily.   omeprazole 20 MG tablet Commonly known as:  PRILOSEC OTC Take 10 mg by mouth daily.   simvastatin 40 MG tablet Commonly known as:  ZOCOR Take 1 tablet (40 mg total) by mouth at bedtime.   THERATEARS 0.25 % Soln Generic drug:  Carboxymethylcellulose Sodium Place 1 drop into both eyes 2 (two) times daily as needed (dry eyes).   Vitamin D3 50 MCG (2000 UT) Tabs Take 2,000 Units by mouth 2 (two) times daily.       Diagnostic Studies: Dg Chest 2 View  Result Date: 07/18/2018 CLINICAL DATA:  Shortness of breath and chest tightness several weeks. EXAM: CHEST - 2 VIEW COMPARISON:  04/29/2018 FINDINGS: Right-sided PICC line has tip over the SVC. Lungs are hypoinflated with slight interval worsening mild left basilar opacification likely atelectasis, although early infection is possible. No evidence of effusion. Cardiomediastinal silhouette is unremarkable. Stable mild compression deformity over the mid to upper thoracic spine. Remainder of the exam is unchanged. IMPRESSION: Mild interval worsening left base opacification likely atelectasis although early infection is possible. Stable mild thoracic spine compression fracture. Electronically Signed   By: Marin Olp  M.D.   On: 07/18/2018 11:58   Dg Pelvis 1-2 Views  Result Date: 07/18/2018 CLINICAL DATA:  Lateral LEFT hip pain, 3 prior surgeries EXAM: PELVIS - 1-2 VIEW COMPARISON:  06/30/2018 FINDINGS: BILATERAL hip prostheses. Bones demineralized. Lateral femoral plate, proximal screws, and multiple cerclage wires at the LEFT femur post ORIF of a periprosthetic fracture, distal extent of hardware not imaged on this study. Osseous demineralization/resorption at healing fracture proximal femur. Lucency at the proximal LEFT femoral diaphysis between the stem of the femoral component and the lateral plate could represent osteolysis at site of healing fracture though a destructive lesion is not completely excluded. No additional fracture, dislocation, or bone destruction. Prior vertebroplasties at L3-L5. IMPRESSION: Post ORIF of a periprosthetic fracture of the LEFT femur. Lucencies at the proximal femur may related to resorption at previously identified fracture planes though this makes it difficult to completely exclude an underlying lytic lesion, recommend attention on follow-up imaging. Electronically Signed   By: Lavonia Dana M.D.   On: 07/18/2018 14:23   Ct Angio Chest Pe W And/or Wo Contrast  Result Date: 07/18/2018 CLINICAL DATA:  Shortness of breath, dyspnea, abnormal chest radiograph question pneumonia versus atelectasis, recent hip surgery EXAM: CT ANGIOGRAPHY CHEST WITH CONTRAST TECHNIQUE: Multidetector CT imaging of the chest was performed using the standard protocol during bolus administration of intravenous contrast. Multiplanar CT image reconstructions and MIPs were obtained to evaluate the vascular anatomy. CONTRAST:  64mL ISOVUE-370 IOPAMIDOL (ISOVUE-370) INJECTION 76% IV COMPARISON:  None FINDINGS: Cardiovascular: Aorta normal caliber without aneurysm or dissection. Minimal atherosclerotic calcification aorta. Heart unremarkable. No pericardial effusion. Tip of RIGHT arm PICC line is within SVC.  Pulmonary arteries suboptimally opacified but grossly patent. No definite evidence of pulmonary embolism. Mediastinum/Nodes: Esophagus normal appearance. Mildly enlarged subcarinal lymph node 12 mm short axis image 39. Few additional normal sized mediastinal lymph nodes. Base of cervical region normal appearance. No hilar or axillary adenopathy. Lungs/Pleura: Subsegmental atelectasis BILATERAL lower lobes. Remaining lungs clear. No infiltrate, pleural effusion, or pneumothorax. Calcified granuloma RIGHT upper lobe. Upper Abdomen: 2.6 x 2.7 cm diameter LEFT adrenal mass with macroscopic foci of fat consistent with adrenal myelolipoma. Calcified granulomata within spleen. Visualized upper abdomen otherwise unremarkable. 9 mm hypervascular focus RIGHT lobe liver image 75 question atypical hemangioma. Musculoskeletal: Diffuse  osseous demineralization. Chronic appearing superior endplate compression fractures of T6 and T12. Review of the MIP images confirms the above findings. IMPRESSION: No definite evidence of pulmonary embolism. Subsegmental atelectasis BILATERAL lower lobes. Single nonspecific mildly enlarged subcarinal lymph node 12 mm short axis. 2.6 x 2.7 cm diameter LEFT adrenal myelolipoma. Aortic Atherosclerosis (ICD10-I70.0). Electronically Signed   By: Lavonia Dana M.D.   On: 07/18/2018 14:01   Ct Hip Left W Contrast  Addendum Date: 07/24/2018   ADDENDUM REPORT: 07/24/2018 20:21 ADDENDUM: Noted is also made of lower lumbar vertebral augmentation of the included L3 through L5 vertebral bodies. Electronically Signed   By: Ashley Royalty M.D.   On: 07/24/2018 20:21   Result Date: 07/24/2018 CLINICAL DATA:  History of hematoma following hip surgery with swelling and pain in the setting of anemia. Rule out fluid collection at the hip. EXAM: CT OF THE LOWER LEFT EXTREMITY WITH CONTRAST TECHNIQUE: Multidetector CT imaging of the lower left extremity was performed according to the standard protocol following  intravenous contrast administration. COMPARISON:  100 cc Omnipaque 300 CONTRAST:  138mL OMNIPAQUE IOHEXOL 300 MG/ML  SOLN FINDINGS: Bones/Joint/Cartilage There is is a long-stem uncemented left total hip arthroplasty noted without hardware failure. Lateral plate and cerclage as well as screw fixation of the proximal 2/3 of the left femur is also noted without complicating features. Subtle lucency along the anterior acetabular wall, series 76 through 78 are identified possibly related to prior trauma. This likely represents an incomplete fracture. No acute hardware failure or malalignment. Small moderate joint effusion is suggested. Ligaments Suboptimally assessed by CT. Muscles and Tendons Low-density postoperative fluid measuring 6.8 x 5.7 x 8.2 cm in transverse by AP by craniocaudad dimension is seen in the expected location of the gluteus medius and lateral subcutaneous soft tissues without definite enhancement to suggest abscess. Muscle atrophy about the left hip. Soft tissues IMPRESSION: 1. Status post long-stem uncemented left total hip arthroplasty without hardware failure. No acute hardware failure or malalignment. 2. Subtle lucency along the left anterior acetabular wall may be related to prior trauma. A nondisplaced fracture is not entirely excluded and is age indeterminate. 3. Low-density postoperative fluid in the expected location of the gluteus medius and subcutaneous soft tissues lateral to the left hip without definite enhancement to suggest abscess. Electronically Signed: By: Ashley Royalty M.D. On: 07/24/2018 20:02   Mr Thoracic Spine Wo Contrast  Result Date: 07/21/2018 CLINICAL DATA:  Back pain and chest pain. EXAM: MRI THORACIC SPINE WITHOUT CONTRAST TECHNIQUE: Multiplanar, multisequence MR imaging of the thoracic spine was performed. No intravenous contrast was administered. COMPARISON:  CT chest 07/18/2018. FINDINGS: Alignment: Normal except for slightly increased kyphosis in the midthoracic  region. Vertebrae: Old healed compression fracture at T6 with loss of height anteriorly 30%. Old healed superior endplate deformities at T11-T12. Evidence recent fracture. Cord:  Normal Paraspinal and other soft tissues: Normal except for very small pleural effusions. Possible of lower lobe atelectasis and or pneumonia. Disc levels: T1-2: Disc bulge.  No stenosis. T2-3 through T4-5: Normal. T5-6: Small central disc protrusion. Mild ligamentous hypertrophy posteriorly. No compressive stenosis. T6-7: Endplate osteophytes and bulging of the disc. Slight narrowing of the ventral subarachnoid space no canal or foraminal stenosis. T7-8 through T9-10: Normal. T10-11: Minimal disc bulge.  No stenosis. T11-12: Mild disc bulge. Mild ligamentous hypertrophy. No stenosis. T12-L1 mild disc bulge.  Mild ligamentous hypertrophy.  No stenosis IMPRESSION: No evidence of recent fracture in the thoracic region. Old healed fractures at T6, T11  and T12. No likely significant degenerative change. No compressive narrowing of the canal or. Small amount of pleural fluid. Possible left lower lobe atelectasis and or pneumonia. Electronically Signed   By: Nelson Chimes M.D.   On: 07/21/2018 13:54   Mr Lumbar Spine Wo Contrast  Result Date: 07/21/2018 CLINICAL DATA:  Acute presentation with back pain and chest pain EXAM: MRI LUMBAR SPINE WITHOUT CONTRAST TECHNIQUE: Multiplanar, multisequence MR imaging of the lumbar spine was performed. No intravenous contrast was administered. COMPARISON:  03/20/2017 FINDINGS: Segmentation: 5 lumbar type vertebral bodies as numbered previously. Alignment:  Normal Vertebrae: Old healed superior endplate fracture at H06. Old previously augmented healed fractures at L3, L4 and L5 without evidence of progression. Conus medullaris and cauda equina: Conus extends to the T12 level. Conus and cauda equina appear normal. Paraspinal and other soft tissues: Negative Disc levels: Mild non-compressive disc bulges at  T11-12 and T12-L1. L1-2 is normal. L2-3: Disc bulge. Mild facet and ligamentous hypertrophy. Mild stenosis of both lateral recesses but without definite neural compression. L3-4: Disc bulge. Mild facet and ligamentous hypertrophy. Mild narrowing of both lateral recesses but without definite neural compression. L4-5: Disc bulge. Mild facet ligamentous hypertrophy. Minimal lateral recess narrowing without neural compression. L5-S1: Bulging of the disc more prominent towards the left. Mild facet hypertrophy. Mild narrowing of the subarticular lateral recesses but without neural compression. Foramina sufficiently patent. IMPRESSION: No acute fracture in the region. Old healed previously augmented fractures at L3, L4 and L5. Lower lumbar degenerative disc disease and degenerative facet disease. Mild narrowing of the lateral recesses but no apparent compressive stenosis. Compared to the study of 2018, there is no significant change. Electronically Signed   By: Nelson Chimes M.D.   On: 07/21/2018 13:50   Dg Chest Port 1 View  Result Date: 07/21/2018 CLINICAL DATA:  Weakness.  Chest pain.  History of pneumonia. EXAM: PORTABLE CHEST 1 VIEW COMPARISON:  Two-view chest x-ray 07/18/2018. CT of the chest 07/18/2018. FINDINGS: Left base atelectasis and scarring is again seen. Aeration is slightly improved. Lung volumes remain low. No other airspace disease is present. Heart size is exaggerated. There is no edema. IMPRESSION: 1. Improving airspace disease and atelectasis at the left base. 2. Chronic curvilinear scarring at the left base. Electronically Signed   By: San Morelle M.D.   On: 07/21/2018 14:19   Dg Femur Min 2 Views Left  Result Date: 07/18/2018 CLINICAL DATA:  Lateral LEFT hip pain, prior surgery EXAM: LEFT FEMUR 2 VIEWS COMPARISON:  06/03/2018 FINDINGS: Osseous demineralization. LEFT hip prosthesis with long stem LEFT femoral component. Additional lateral plate, proximal/distal screws and multiple  cerclage wires at LEFT femur. Area of bony resorption is identified approximately at the level of the lesser trochanter extending laterally and distally question resorption at healing fracture. No additional fracture, dislocation, or bone destruction. Mild diffuse knee joint space narrowing. IMPRESSION: Post ORIF of a periprosthetic fracture of the LEFT femur. Bony lucencies at the proximal LEFT femoral diaphysis likely reflect resorption at prior fracture though difficult to entirely exclude underlying lytic lesion, recommend attention on follow-up imaging. Hardware intact without new fracture or dislocation. Electronically Signed   By: Lavonia Dana M.D.   On: 07/18/2018 14:27   Vas [place] Lower Extremity Venous (dvt) (mc And Wl 7a-7p)  Result Date: 07/19/2018  Lower Venous Study Indications: Swelling.  Performing Technologist: Oliver Hum RVT  Examination Guidelines: A complete evaluation includes B-mode imaging, spectral Doppler, color Doppler, and power Doppler as needed of all  accessible portions of each vessel. Bilateral testing is considered an integral part of a complete examination. Limited examinations for reoccurring indications may be performed as noted.  Right Venous Findings: +---+---------------+---------+-----------+----------+-------+    CompressibilityPhasicitySpontaneityPropertiesSummary +---+---------------+---------+-----------+----------+-------+ CFVFull           Yes      Yes                          +---+---------------+---------+-----------+----------+-------+  Left Venous Findings: +---------+---------------+---------+-----------+----------+-------+          CompressibilityPhasicitySpontaneityPropertiesSummary +---------+---------------+---------+-----------+----------+-------+ CFV      Full           Yes      Yes                          +---------+---------------+---------+-----------+----------+-------+ SFJ      Full                                                  +---------+---------------+---------+-----------+----------+-------+ FV Prox  Full                                                 +---------+---------------+---------+-----------+----------+-------+ FV Mid   Full                                                 +---------+---------------+---------+-----------+----------+-------+ FV DistalFull                                                 +---------+---------------+---------+-----------+----------+-------+ PFV      Full                                                 +---------+---------------+---------+-----------+----------+-------+ POP      Full           Yes      Yes                          +---------+---------------+---------+-----------+----------+-------+ PTV      Full                                                 +---------+---------------+---------+-----------+----------+-------+ PERO     Full                                                 +---------+---------------+---------+-----------+----------+-------+    Summary: Right: No evidence of common femoral vein obstruction. Left: There is no evidence of deep vein thrombosis in  the lower extremity. No cystic structure found in the popliteal fossa.  *See table(s) above for measurements and observations. Electronically signed by Monica Martinez MD on 07/19/2018 at 10:38:14 AM.    Final       Follow-up Information    [name], MD Follow up in 1 week(s).   Specialty:  Orthopedic Surgery Contact information: [facility] [address] [place] [facility].   Specialty:  Home Health Services Why:  They will contact you Monday to arrange home PT. Contact information: [address] [place] [address] [phone]           Discharge Plan:  discharge to home  Disposition:     Signed: Benjiman Core  08/03/2018, 3:48 PM

## 2018-08-04 ENCOUNTER — Ambulatory Visit (INDEPENDENT_AMBULATORY_CARE_PROVIDER_SITE_OTHER): Payer: Medicare Other

## 2018-08-04 ENCOUNTER — Ambulatory Visit (INDEPENDENT_AMBULATORY_CARE_PROVIDER_SITE_OTHER): Payer: Medicare Other | Admitting: Orthopaedic Surgery

## 2018-08-04 ENCOUNTER — Encounter (INDEPENDENT_AMBULATORY_CARE_PROVIDER_SITE_OTHER): Payer: Self-pay | Admitting: Orthopaedic Surgery

## 2018-08-04 VITALS — BP 131/73 | HR 60 | Ht 65.0 in | Wt 195.0 lb

## 2018-08-04 DIAGNOSIS — M978XXD Periprosthetic fracture around other internal prosthetic joint, subsequent encounter: Secondary | ICD-10-CM

## 2018-08-04 DIAGNOSIS — Z96649 Presence of unspecified artificial hip joint: Secondary | ICD-10-CM

## 2018-08-04 NOTE — Progress Notes (Signed)
Post-Op Visit Note   Patient: Emma Lewis           Date of Birth: [birth year]           MRN: [id number] Visit Date: 08/04/2018 PCP: Philmore Pali, NP   Assessment & Plan: Follow-up periprosthetic femur fracture with ORIF with hip subluxation.  She has had problems with slow wound healing hypoalbuminemia is increasing her caloric intake and is now weightbearing as tolerated with therapy at home.  X-rays show good position alignment and interval healing.  She needs to continue with p.o. intake increase particularly protein.  Recheck 1 month.  No x-rays on return unless she is having problems.  Sutures were removed today.  Chief Complaint:  Chief Complaint  Patient presents with  . Left Leg - Follow-up    06/19/18 I&D Left hip hematoma, antibiotic beads, application of wound vac   Visit Diagnoses:  1. Periprosthetic fracture of femur at tip of prosthesis, subsequent encounter     Plan: suture removal.   Follow-Up Instructions: No follow-ups on file.   Orders:  Orders Placed This Encounter  Procedures  . XR FEMUR MIN 2 VIEWS LEFT   No orders of the defined types were placed in this encounter.   Imaging: No results found.  PMFS History: Patient Active Problem List   Diagnosis Date Noted  . Symptomatic anemia   . Ambulatory dysfunction 07/21/2018  . Weakness of both legs 07/21/2018  . Proteus mirabilis infection   . Hip hematoma, left 06/19/2018  . Hematoma of left hip 06/19/2018  . Hip dislocation, left ([place]) 05/29/2018  . Peri-prosthetic femur fracture at tip of prosthesis 05/29/2018  . Prediabetes   . Popping sound of knee joint   . Hypoalbuminemia due to protein-calorie malnutrition ([place])   . Transaminitis   . Leukocytosis   . Postoperative pain   . Failure of left total hip arthroplasty ([place]) 05/01/2018  . Acute blood loss anemia   . Leukemoid reaction   . 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 Joint effusion of pelvis or thigh, left 04/22/2018  . Trochanteric bursitis,  left hip 04/22/2018  . History of left hip replacement 11/12/2017  . Bilateral primary osteoarthritis of knee 11/12/2017  . GERD (gastroesophageal reflux disease) 01/17/2017  . Hypothyroidism 01/17/2017  . Vertigo 01/16/2017  . Nausea and vomiting 01/16/2017  . Essential hypertension 01/16/2017  . Fatty liver 01/16/2017  . Failed total hip arthroplasty ([place]) 06/10/2016  . Pelvic mass 06/15/2015  . Closed wedge compression fracture of fifth lumbar vertebra ([place])   . Cervical spondylosis 02/09/2014  . DDD (degenerative disc disease), lumbar 05/13/2013  . OA (osteoarthritis) 05/13/2013   Past Medical History:  Diagnosis Date  . Arthritis    "all over my body" (04/29/2018)  . Chronic lower back pain   . Fatty liver   . Fever blister    Takes Acyclovir  . GERD (gastroesophageal reflux disease)   . Hyperlipemia   . Hypothyroidism   . Migraines   . Osteopenia   . Pneumonia    "once; years ago" (04/29/2018)  . PONV (postoperative nausea and vomiting)   . Pre-diabetes   . Seasonal allergies   . Vertigo     Family History  Problem Relation Age of Onset  . Lymphoma Mother     Past Surgical History:  Procedure Laterality Date  . ANTERIOR CERVICAL DECOMP/DISCECTOMY FUSION N/A 02/09/2014   Procedure: ANTERIOR CERVICAL DECOMPRESSION/DISCECTOMY FUSION 2 LEVELS;  Surgeon: [name], MD;  Location: [place] [facility];  Service: Orthopedics;  Laterality: N/A;  C4-5, C5-6 Anterior Cervical Discectomy and Fusion, Allograft, Plate  . BACK SURGERY    . CARPAL TUNNEL RELEASE Right 2018  . CATARACT EXTRACTION W/ INTRAOCULAR LENS  IMPLANT, BILATERAL    . COLONOSCOPY    . FIXATION KYPHOPLASTY LUMBAR SPINE  X 2  . HIP ARTHROPLASTY Left 2011  . INCISION AND DRAINAGE HIP Left 06/19/2018   Procedure: IRRIGATION AND DEBRIDEMENT LEFT HIP HEMATOMA ANTIBIOTIC BEADS, APPLICATION ON WOUND VAC;  Surgeon: [name], MD;  Location: [facility] [place];  Service: Orthopedics;  Laterality: Left;  . JOINT REPLACEMENT    . KNEE  ARTHROSCOPY Right   . ORIF PERIPROSTHETIC FRACTURE Left 06/01/2018   Procedure: OPEN REDUCTION INTERNAL FIXATION (ORIF) PERIPROSTHETIC FEMUR FRACTURE;  Surgeon: [name], MD;  Location: [place];  Service: Orthopedics;  Laterality: Left;  . PATELLA FRACTURE SURGERY Right ~ 1990   "put metal in"  . PATELLA HARDWARE REMOVAL Right    "took the metal out"  . REVISION TOTAL HIP ARTHROPLASTY Left 04/29/2018  . ROBOTIC ASSISTED BILATERAL SALPINGO OOPHERECTOMY Bilateral 08/01/2015   Procedure: ROBOTIC ASSISTED BILATERAL SALPINGO OOPHORECTOMY;  Surgeon: Everitt Amber, MD;  Location: [facility];  Service: Gynecology;  Laterality: Bilateral;  . SHOULDER OPEN ROTATOR CUFF REPAIR Right    3 TOTAL  . SHOULDER SURGERY Right   . TOTAL HIP ARTHROPLASTY Right 01/21/2018   Procedure: RIGHT TOTAL HIP ARTHROPLASTY DIRECT ANTERIOR;  Surgeon: [name], MD;  Location: [place];  Service: Orthopedics;  Laterality: Right;  . TOTAL HIP REVISION Left 06/10/2016   Procedure: TOTAL HIP REVISION ARTHROPLASTY;  Surgeon: Rod Can, MD;  Location: [place];  Service: Orthopedics;  Laterality: Left;  . TOTAL HIP REVISION Left 04/29/2018   Procedure: left total hip arthroplasty revision posterior approach;  Surgeon: [name], MD;  Location: [place];  Service: Orthopedics;  Laterality: Left;  . WRIST GANGLION EXCISION Left 1970s   Social History   Occupational History  . Not on file  Tobacco Use  . Smoking status: Never Smoker  . Smokeless tobacco: Never Used  Substance and Sexual Activity  . Alcohol use: Yes    Comment: 04/29/2018 "maybe 2 drinks/year"  . Drug use: Never  . Sexual activity: Not Currently

## 2018-08-05 DIAGNOSIS — N189 Chronic kidney disease, unspecified: Secondary | ICD-10-CM | POA: Diagnosis not present

## 2018-08-05 DIAGNOSIS — H8110 Benign paroxysmal vertigo, unspecified ear: Secondary | ICD-10-CM | POA: Diagnosis not present

## 2018-08-05 DIAGNOSIS — M170 Bilateral primary osteoarthritis of knee: Secondary | ICD-10-CM | POA: Diagnosis not present

## 2018-08-05 DIAGNOSIS — I129 Hypertensive chronic kidney disease with stage 1 through stage 4 chronic kidney disease, or unspecified chronic kidney disease: Secondary | ICD-10-CM | POA: Diagnosis not present

## 2018-08-05 DIAGNOSIS — T8452XD Infection and inflammatory reaction due to internal left hip prosthesis, subsequent encounter: Secondary | ICD-10-CM | POA: Diagnosis not present

## 2018-08-05 DIAGNOSIS — T84031D Mechanical loosening of internal left hip prosthetic joint, subsequent encounter: Secondary | ICD-10-CM | POA: Diagnosis not present

## 2018-08-06 ENCOUNTER — Inpatient Hospital Stay: Payer: Medicare Other | Admitting: Infectious Diseases

## 2018-08-10 DIAGNOSIS — H8110 Benign paroxysmal vertigo, unspecified ear: Secondary | ICD-10-CM | POA: Diagnosis not present

## 2018-08-10 DIAGNOSIS — T84031D Mechanical loosening of internal left hip prosthetic joint, subsequent encounter: Secondary | ICD-10-CM | POA: Diagnosis not present

## 2018-08-10 DIAGNOSIS — M170 Bilateral primary osteoarthritis of knee: Secondary | ICD-10-CM | POA: Diagnosis not present

## 2018-08-10 DIAGNOSIS — T8452XD Infection and inflammatory reaction due to internal left hip prosthesis, subsequent encounter: Secondary | ICD-10-CM | POA: Diagnosis not present

## 2018-08-10 DIAGNOSIS — N189 Chronic kidney disease, unspecified: Secondary | ICD-10-CM | POA: Diagnosis not present

## 2018-08-10 DIAGNOSIS — I129 Hypertensive chronic kidney disease with stage 1 through stage 4 chronic kidney disease, or unspecified chronic kidney disease: Secondary | ICD-10-CM | POA: Diagnosis not present

## 2018-08-12 ENCOUNTER — Telehealth (INDEPENDENT_AMBULATORY_CARE_PROVIDER_SITE_OTHER): Payer: Self-pay

## 2018-08-12 DIAGNOSIS — N189 Chronic kidney disease, unspecified: Secondary | ICD-10-CM | POA: Diagnosis not present

## 2018-08-12 DIAGNOSIS — M170 Bilateral primary osteoarthritis of knee: Secondary | ICD-10-CM | POA: Diagnosis not present

## 2018-08-12 DIAGNOSIS — H8110 Benign paroxysmal vertigo, unspecified ear: Secondary | ICD-10-CM | POA: Diagnosis not present

## 2018-08-12 DIAGNOSIS — T8452XD Infection and inflammatory reaction due to internal left hip prosthesis, subsequent encounter: Secondary | ICD-10-CM | POA: Diagnosis not present

## 2018-08-12 DIAGNOSIS — I129 Hypertensive chronic kidney disease with stage 1 through stage 4 chronic kidney disease, or unspecified chronic kidney disease: Secondary | ICD-10-CM | POA: Diagnosis not present

## 2018-08-12 DIAGNOSIS — T84031D Mechanical loosening of internal left hip prosthetic joint, subsequent encounter: Secondary | ICD-10-CM | POA: Diagnosis not present

## 2018-08-12 NOTE — Telephone Encounter (Signed)
Anderson Malta, HHPT with [facility] care called stating that patient had water blister around her incision that was draining on Monday and advised patient to change dressing and today there is no drainage, but some redness and warmness.  Would like to know if patient can wash with warm soap and water and let incision air dry?  Cb# is [phone].  Please advise.  Thank you.

## 2018-08-12 NOTE — Telephone Encounter (Signed)
Please advise 

## 2018-08-12 NOTE — Telephone Encounter (Signed)
error 

## 2018-08-13 NOTE — Telephone Encounter (Signed)
I called husband.  He states there is no dressing on the wound and there is no drainage.  She is continue antibiotics continue eating continue walking and gradually improving.  When they get low they will call for refill of the Cipro.  If she has increased drainage they will call.

## 2018-08-13 NOTE — Telephone Encounter (Signed)
Noted  

## 2018-08-18 DIAGNOSIS — H8110 Benign paroxysmal vertigo, unspecified ear: Secondary | ICD-10-CM | POA: Diagnosis not present

## 2018-08-18 DIAGNOSIS — T8452XD Infection and inflammatory reaction due to internal left hip prosthesis, subsequent encounter: Secondary | ICD-10-CM | POA: Diagnosis not present

## 2018-08-18 DIAGNOSIS — I129 Hypertensive chronic kidney disease with stage 1 through stage 4 chronic kidney disease, or unspecified chronic kidney disease: Secondary | ICD-10-CM | POA: Diagnosis not present

## 2018-08-18 DIAGNOSIS — N189 Chronic kidney disease, unspecified: Secondary | ICD-10-CM | POA: Diagnosis not present

## 2018-08-18 DIAGNOSIS — T84031D Mechanical loosening of internal left hip prosthetic joint, subsequent encounter: Secondary | ICD-10-CM | POA: Diagnosis not present

## 2018-08-18 DIAGNOSIS — M170 Bilateral primary osteoarthritis of knee: Secondary | ICD-10-CM | POA: Diagnosis not present

## 2018-08-20 ENCOUNTER — Other Ambulatory Visit (INDEPENDENT_AMBULATORY_CARE_PROVIDER_SITE_OTHER): Payer: Self-pay | Admitting: Orthopaedic Surgery

## 2018-08-20 DIAGNOSIS — T8452XD Infection and inflammatory reaction due to internal left hip prosthesis, subsequent encounter: Secondary | ICD-10-CM | POA: Diagnosis not present

## 2018-08-20 DIAGNOSIS — M170 Bilateral primary osteoarthritis of knee: Secondary | ICD-10-CM | POA: Diagnosis not present

## 2018-08-20 DIAGNOSIS — H8110 Benign paroxysmal vertigo, unspecified ear: Secondary | ICD-10-CM | POA: Diagnosis not present

## 2018-08-20 DIAGNOSIS — I129 Hypertensive chronic kidney disease with stage 1 through stage 4 chronic kidney disease, or unspecified chronic kidney disease: Secondary | ICD-10-CM | POA: Diagnosis not present

## 2018-08-20 DIAGNOSIS — N189 Chronic kidney disease, unspecified: Secondary | ICD-10-CM | POA: Diagnosis not present

## 2018-08-20 DIAGNOSIS — T84031D Mechanical loosening of internal left hip prosthetic joint, subsequent encounter: Secondary | ICD-10-CM | POA: Diagnosis not present

## 2018-08-20 NOTE — Telephone Encounter (Signed)
Please advise 

## 2018-08-25 DIAGNOSIS — T8452XD Infection and inflammatory reaction due to internal left hip prosthesis, subsequent encounter: Secondary | ICD-10-CM | POA: Diagnosis not present

## 2018-08-25 DIAGNOSIS — I129 Hypertensive chronic kidney disease with stage 1 through stage 4 chronic kidney disease, or unspecified chronic kidney disease: Secondary | ICD-10-CM | POA: Diagnosis not present

## 2018-08-25 DIAGNOSIS — M170 Bilateral primary osteoarthritis of knee: Secondary | ICD-10-CM | POA: Diagnosis not present

## 2018-08-25 DIAGNOSIS — N189 Chronic kidney disease, unspecified: Secondary | ICD-10-CM | POA: Diagnosis not present

## 2018-08-25 DIAGNOSIS — H8110 Benign paroxysmal vertigo, unspecified ear: Secondary | ICD-10-CM | POA: Diagnosis not present

## 2018-08-25 DIAGNOSIS — T84031D Mechanical loosening of internal left hip prosthetic joint, subsequent encounter: Secondary | ICD-10-CM | POA: Diagnosis not present

## 2018-08-27 DIAGNOSIS — T84031D Mechanical loosening of internal left hip prosthetic joint, subsequent encounter: Secondary | ICD-10-CM | POA: Diagnosis not present

## 2018-08-27 DIAGNOSIS — T8452XD Infection and inflammatory reaction due to internal left hip prosthesis, subsequent encounter: Secondary | ICD-10-CM | POA: Diagnosis not present

## 2018-08-27 DIAGNOSIS — H8110 Benign paroxysmal vertigo, unspecified ear: Secondary | ICD-10-CM | POA: Diagnosis not present

## 2018-08-27 DIAGNOSIS — N189 Chronic kidney disease, unspecified: Secondary | ICD-10-CM | POA: Diagnosis not present

## 2018-08-27 DIAGNOSIS — M170 Bilateral primary osteoarthritis of knee: Secondary | ICD-10-CM | POA: Diagnosis not present

## 2018-08-27 DIAGNOSIS — I129 Hypertensive chronic kidney disease with stage 1 through stage 4 chronic kidney disease, or unspecified chronic kidney disease: Secondary | ICD-10-CM | POA: Diagnosis not present

## 2018-09-01 DIAGNOSIS — M170 Bilateral primary osteoarthritis of knee: Secondary | ICD-10-CM | POA: Diagnosis not present

## 2018-09-01 DIAGNOSIS — N189 Chronic kidney disease, unspecified: Secondary | ICD-10-CM | POA: Diagnosis not present

## 2018-09-01 DIAGNOSIS — T84031D Mechanical loosening of internal left hip prosthetic joint, subsequent encounter: Secondary | ICD-10-CM | POA: Diagnosis not present

## 2018-09-01 DIAGNOSIS — H8110 Benign paroxysmal vertigo, unspecified ear: Secondary | ICD-10-CM | POA: Diagnosis not present

## 2018-09-01 DIAGNOSIS — I129 Hypertensive chronic kidney disease with stage 1 through stage 4 chronic kidney disease, or unspecified chronic kidney disease: Secondary | ICD-10-CM | POA: Diagnosis not present

## 2018-09-01 DIAGNOSIS — T8452XD Infection and inflammatory reaction due to internal left hip prosthesis, subsequent encounter: Secondary | ICD-10-CM | POA: Diagnosis not present

## 2018-09-02 ENCOUNTER — Telehealth (INDEPENDENT_AMBULATORY_CARE_PROVIDER_SITE_OTHER): Payer: Self-pay | Admitting: Orthopaedic Surgery

## 2018-09-02 NOTE — Telephone Encounter (Signed)
Emma Lewis with [facility] care called stating patient;s left leg is making popping sounds when she move and patient is concerned about it. Anderson Malta said patient is still having pain in her knee. Anderson Malta said the swelling has gone down. The number to contact Anderson Malta is [phone]

## 2018-09-02 NOTE — Telephone Encounter (Signed)
Please advise 

## 2018-09-03 DIAGNOSIS — H8110 Benign paroxysmal vertigo, unspecified ear: Secondary | ICD-10-CM | POA: Diagnosis not present

## 2018-09-03 DIAGNOSIS — I129 Hypertensive chronic kidney disease with stage 1 through stage 4 chronic kidney disease, or unspecified chronic kidney disease: Secondary | ICD-10-CM | POA: Diagnosis not present

## 2018-09-03 DIAGNOSIS — T84031D Mechanical loosening of internal left hip prosthetic joint, subsequent encounter: Secondary | ICD-10-CM | POA: Diagnosis not present

## 2018-09-03 DIAGNOSIS — T8452XD Infection and inflammatory reaction due to internal left hip prosthesis, subsequent encounter: Secondary | ICD-10-CM | POA: Diagnosis not present

## 2018-09-03 DIAGNOSIS — M170 Bilateral primary osteoarthritis of knee: Secondary | ICD-10-CM | POA: Diagnosis not present

## 2018-09-03 DIAGNOSIS — N189 Chronic kidney disease, unspecified: Secondary | ICD-10-CM | POA: Diagnosis not present

## 2018-09-03 NOTE — Telephone Encounter (Signed)
I called not answer , pt has appt with me tomorrow 2/7 will assess at that time.

## 2018-09-04 ENCOUNTER — Encounter (INDEPENDENT_AMBULATORY_CARE_PROVIDER_SITE_OTHER): Payer: Self-pay | Admitting: Orthopaedic Surgery

## 2018-09-04 ENCOUNTER — Ambulatory Visit (INDEPENDENT_AMBULATORY_CARE_PROVIDER_SITE_OTHER): Payer: Medicare Other | Admitting: Orthopaedic Surgery

## 2018-09-04 VITALS — BP 133/73 | HR 87 | Ht 65.0 in | Wt 195.0 lb

## 2018-09-04 DIAGNOSIS — T84011S Broken internal left hip prosthesis, sequela: Secondary | ICD-10-CM

## 2018-09-04 NOTE — Progress Notes (Signed)
Post-Op Visit Note   Patient: Emma Lewis           Date of Birth: December 30, 1950           MRN: [id number] Visit Date: 09/04/2018 PCP: Philmore Pali, NP   Assessment & Plan: Postop hip revision with washout procedures revision longstem planing for periprosthetic fracture.  She is had surgery 04/29/2018 for revision periprosthetic fracture 06/01/2018 and I&D hematoma antibiotic bead placement PICC line and application of wound VAC 06/19/2018.  PICC line's outpatient still on oral antibiotics.  She is walking with her walker today short distance.  Therapist is coming to her house and states she is having a lot of anterior thigh pain with leg lifting exercises  Chief Complaint:  Chief Complaint  Patient presents with  . Left Leg - Follow-up   Visit Diagnoses:  1. Failure of left total hip arthroplasty, sequela     Plan: She could stop her iron pill she is gradually walking better every visit I plan to recheck her in a month.  We will repeat AP lateral femur images from her hip to her knee on return.  Follow-Up Instructions: No follow-ups on file.   Orders:  No orders of the defined types were placed in this encounter.  No orders of the defined types were placed in this encounter.   Imaging: No results found.  PMFS History: Patient Active Problem List   Diagnosis Date Noted  . Symptomatic anemia   . Ambulatory dysfunction 07/21/2018  . Weakness of both legs 07/21/2018  . Proteus mirabilis infection   . Hip hematoma, left 06/19/2018  . Hematoma of left hip 06/19/2018  . Hip dislocation, left ([place]) 05/29/2018  . Peri-prosthetic femur fracture at tip of prosthesis 05/29/2018  . Prediabetes   . Popping sound of knee joint   . Hypoalbuminemia due to protein-calorie malnutrition ([place])   . Transaminitis   . Leukocytosis   . Postoperative pain   . Failure of left total hip arthroplasty ([place]) 05/01/2018  . Acute blood loss anemia   . Leukemoid reaction   . 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  Joint effusion of pelvis or thigh, left 04/22/2018  . Trochanteric bursitis, left hip 04/22/2018  . History of left hip replacement 11/12/2017  . Bilateral primary osteoarthritis of knee 11/12/2017  . GERD (gastroesophageal reflux disease) 01/17/2017  . Hypothyroidism 01/17/2017  . Vertigo 01/16/2017  . Nausea and vomiting 01/16/2017  . Essential hypertension 01/16/2017  . Fatty liver 01/16/2017  . Failed total hip arthroplasty ([place]) 06/10/2016  . Pelvic mass 06/15/2015  . Closed wedge compression fracture of fifth lumbar vertebra ([place])   . Cervical spondylosis 02/09/2014  . DDD (degenerative disc disease), lumbar 05/13/2013  . OA (osteoarthritis) 05/13/2013   Past Medical History:  Diagnosis Date  . Arthritis    "all over my body" (04/29/2018)  . Chronic lower back pain   . Fatty liver   . Fever blister    Takes Acyclovir  . GERD (gastroesophageal reflux disease)   . Hyperlipemia   . Hypothyroidism   . Migraines   . Osteopenia   . Pneumonia    "once; years ago" (04/29/2018)  . PONV (postoperative nausea and vomiting)   . Pre-diabetes   . Seasonal allergies   . Vertigo     Family History  Problem Relation Age of Onset  . Lymphoma Mother     Past Surgical History:  Procedure Laterality Date  . ANTERIOR CERVICAL DECOMP/DISCECTOMY FUSION N/A 02/09/2014  Procedure: ANTERIOR CERVICAL DECOMPRESSION/DISCECTOMY FUSION 2 LEVELS;  Surgeon: [name], MD;  Location: [place];  Service: Orthopedics;  Laterality: N/A;  C4-5, C5-6 Anterior Cervical Discectomy and Fusion, Allograft, Plate  . BACK SURGERY    . CARPAL TUNNEL RELEASE Right 2018  . CATARACT EXTRACTION W/ INTRAOCULAR LENS  IMPLANT, BILATERAL    . COLONOSCOPY    . FIXATION KYPHOPLASTY LUMBAR SPINE  X 2  . HIP ARTHROPLASTY Left 2011  . INCISION AND DRAINAGE HIP Left 06/19/2018   Procedure: IRRIGATION AND DEBRIDEMENT LEFT HIP HEMATOMA ANTIBIOTIC BEADS, APPLICATION ON WOUND VAC;  Surgeon: [name], MD;  Location: [place];  Service: Orthopedics;  Laterality: Left;  . JOINT REPLACEMENT    . KNEE ARTHROSCOPY Right   . ORIF PERIPROSTHETIC FRACTURE Left 06/01/2018   Procedure: OPEN REDUCTION INTERNAL FIXATION (ORIF) PERIPROSTHETIC FEMUR FRACTURE;  Surgeon: [name], MD;  Location: [place];  Service: Orthopedics;  Laterality: Left;  . PATELLA FRACTURE SURGERY Right ~ 1990   "put metal in"  . PATELLA HARDWARE REMOVAL Right    "took the metal out"  . REVISION TOTAL HIP ARTHROPLASTY Left 04/29/2018  . ROBOTIC ASSISTED BILATERAL SALPINGO OOPHERECTOMY Bilateral 08/01/2015   Procedure: ROBOTIC ASSISTED BILATERAL SALPINGO OOPHORECTOMY;  Surgeon: Everitt Amber, MD;  Location: [facility];  Service: Gynecology;  Laterality: Bilateral;  . SHOULDER OPEN ROTATOR CUFF REPAIR Right    3 TOTAL  . SHOULDER SURGERY Right   . TOTAL HIP ARTHROPLASTY Right 01/21/2018   Procedure: RIGHT TOTAL HIP ARTHROPLASTY DIRECT ANTERIOR;  Surgeon: [name], MD;  Location: [place];  Service: Orthopedics;  Laterality: Right;  . TOTAL HIP REVISION Left 06/10/2016   Procedure: TOTAL HIP REVISION ARTHROPLASTY;  Surgeon: Rod Can, MD;  Location: [place];  Service: Orthopedics;  Laterality: Left;  . TOTAL HIP REVISION Left 04/29/2018   Procedure: left total hip arthroplasty revision posterior approach;  Surgeon: [name], MD;  Location: [place];  Service: Orthopedics;  Laterality: Left;  . WRIST GANGLION EXCISION Left 1970s   Social History   Occupational History  . Not on file  Tobacco Use  . Smoking status: Never Smoker  . Smokeless tobacco: Never Used  Substance and Sexual Activity  . Alcohol use: Yes    Comment: 04/29/2018 "maybe 2 drinks/year"  . Drug use: Never  . Sexual activity: Not Currently

## 2018-09-08 DIAGNOSIS — N189 Chronic kidney disease, unspecified: Secondary | ICD-10-CM | POA: Diagnosis not present

## 2018-09-08 DIAGNOSIS — T8452XD Infection and inflammatory reaction due to internal left hip prosthesis, subsequent encounter: Secondary | ICD-10-CM | POA: Diagnosis not present

## 2018-09-08 DIAGNOSIS — T84031D Mechanical loosening of internal left hip prosthetic joint, subsequent encounter: Secondary | ICD-10-CM | POA: Diagnosis not present

## 2018-09-08 DIAGNOSIS — M170 Bilateral primary osteoarthritis of knee: Secondary | ICD-10-CM | POA: Diagnosis not present

## 2018-09-08 DIAGNOSIS — H8110 Benign paroxysmal vertigo, unspecified ear: Secondary | ICD-10-CM | POA: Diagnosis not present

## 2018-09-08 DIAGNOSIS — I129 Hypertensive chronic kidney disease with stage 1 through stage 4 chronic kidney disease, or unspecified chronic kidney disease: Secondary | ICD-10-CM | POA: Diagnosis not present

## 2018-09-16 ENCOUNTER — Ambulatory Visit (INDEPENDENT_AMBULATORY_CARE_PROVIDER_SITE_OTHER): Payer: Medicare Other | Admitting: Orthopaedic Surgery

## 2018-09-16 ENCOUNTER — Ambulatory Visit (INDEPENDENT_AMBULATORY_CARE_PROVIDER_SITE_OTHER): Payer: Medicare Other

## 2018-09-16 ENCOUNTER — Encounter (INDEPENDENT_AMBULATORY_CARE_PROVIDER_SITE_OTHER): Payer: Self-pay | Admitting: Orthopaedic Surgery

## 2018-09-16 VITALS — BP 142/73 | HR 87 | Ht 65.0 in | Wt 195.0 lb

## 2018-09-16 DIAGNOSIS — M25552 Pain in left hip: Secondary | ICD-10-CM | POA: Diagnosis not present

## 2018-09-16 DIAGNOSIS — M978XXD Periprosthetic fracture around other internal prosthetic joint, subsequent encounter: Secondary | ICD-10-CM

## 2018-09-16 DIAGNOSIS — Z96649 Presence of unspecified artificial hip joint: Secondary | ICD-10-CM | POA: Diagnosis not present

## 2018-09-16 DIAGNOSIS — S73005A Unspecified dislocation of left hip, initial encounter: Secondary | ICD-10-CM

## 2018-09-16 NOTE — Progress Notes (Signed)
Office Visit Note   Patient: Emma Lewis           Date of Birth: 14-Jun-1951           MRN: [id number] Visit Date: 09/16/2018              Requested by: Philmore Pali, NP [address] [place], [place] [address] PCP: Philmore Pali, NP   Assessment & Plan: Visit Diagnoses:  1. Pain in left hip   2. Periprosthetic fracture of femur at tip of prosthesis, subsequent encounter   3. Dislocation of left hip, initial encounter [facility])     Plan: Patient has complex problem.  I reviewed patient's x-rays with several my partners including Dr. Ninfa Linden and Dr. Marlou Sa.  Patient will require open reduction with revision of the femoral stem.  She may require fixation with cement fixation using the same stem.  Her stem is rotated and shortened and with a rotated stem close reduction alone will not be successful.  She has a 1.5 neck currently positioned and we do have several sizes that we could lengthen the neck.  We discussed that she possibly could have problems with the deep infection however her hip incision is healed nicely her nutrition is improved and she has had no fever or drainage.  Plan to be exploration with availability of constrained liner in case is needed.  Plan would be backing the stem up and likely cement fixation in appropriate version and appropriate height.  Risks of surgery discussed including possibility of femur fracture and attempt to get her hip reduced again.  Cable plate and all screws of held securely since her November surgery.  She understands request to proceed.  We will make sure that the restrained liners are available.  Patient is a 13.5 stem and we also could go up in size if needed however with her osteopenia and if an 8 inch fully coated stem subsided after several months of good stability it would be best to cement the stem to prevent subsidence and repeat dislocation. Plan was discussed with patient and her husband.  They agree to proceed.  We discussed options with referring her  to the [facility] and they would prefer to stay where they are.  Follow-Up Instructions: No follow-ups on file.   Orders:  Orders Placed This Encounter  Procedures  . XR HIP UNILAT W OR W/O PELVIS 2-3 VIEWS LEFT  . XR FEMUR MIN 2 VIEWS LEFT   No orders of the defined types were placed in this encounter.     Procedures: No procedures performed   Clinical Data: No additional findings.   Subjective: Chief Complaint  Patient presents with  . Left Hip - Pain  . Right Knee - Pain    HPI 68 year old female returns with complex history of problems of her left hip after total hip arthroplasty with metallosis.  Original right medical prosthesis 2011 done at [facility] by Dr. Eliezer Lofts.  She had problems with metallosis and had broken poly-cup which was revised with the [employer] acetabular shell by Dr. Lyla Glassing.  Patient had a ProFemur Wright medical stem with metallosis, trunionosis.  On 04/29/2018 she had implantation of a grips and acetabular shell, 8 inch AML fully coated stem +1.5 ball.  Femoral canal was reamed to 14 mm.  Long trochanteric osteotomy was performed for removal of the implant and 4 cables were placed.  Patient did well for 1 month then suffered a periprosthetic fracture and had  repeat surgery on 06/01/2018 with long plate with trochanteric attachment proximally and fixation with multiple cables multiple screws and at the time of her fracture she had a dislocation of her hip.  After this procedure 3 weeks later on 06/19/2018 she developed drainage of her hip and had deep infection with cultures growing Proteus mirabilis which was sensitive to every antibiotic tested.  She was seen by infectious disease underwent washout procedure antibiotic bead placement with tobramycin vancomycin beads, PICC line placement repeat debridement VAC placement and then at home on antibiotics doing physical therapy.  Few weeks ago she noticed increased pain in her hip.  Last x-rays  were obtained on 08/04/2018 showed good position of the hip as well as plate cables and screws.  New x-rays obtained today shows 5 mm subsidence of the stem with rotation at least 40 degrees of the stem and a dislocated hip.  There is no change in the plate cables and femoral tube appears intact.  Patient mentioned that she started having some increased pain when she is using a rope something on her foot with a therapist while in the chair or recliner trying to stretch her hip up into flexion.  She does not recall any specific pop incident but did have increased pain for a week.  Currently patient is still on Cipro 750 mg 1 p.o. twice daily.  Hip incision is well-healed and she is not had any drainage in over a month.  Review of Systems positive for right total hip arthroplasty doing well.  Two-level cervical fusion 2015 doing well.  Osteoporosis.  History of L5 compression fracture.  Hypoalbuminemia, previous vertebroplasty L4.  Patient fractures L3 and L5 2016.  Metallosis.  Prediabetes.  Left total hip arthroplasty deep infection.  Periprosthetic fracture.  Otherwise negative as pertains HPI.   Objective: Vital Signs: BP (!) 142/73   Pulse 87   Ht 5\' 5"  (1.651 m)   Wt 195 lb (88.5 kg)   BMI 32.45 kg/m   Physical Exam Constitutional:      Appearance: She is well-developed.  HENT:     Head: Normocephalic.     Right Ear: External ear normal.     Left Ear: External ear normal.  Eyes:     Pupils: Pupils are equal, round, and reactive to light.  Neck:     Thyroid: No thyromegaly.     Trachea: No tracheal deviation.  Cardiovascular:     Rate and Rhythm: Normal rate.  Pulmonary:     Effort: Pulmonary effort is normal.  Abdominal:     Palpations: Abdomen is soft.  Skin:    General: Skin is warm and dry.  Neurological:     Mental Status: She is alert and oriented to person, place, and time.  Psychiatric:        Behavior: Behavior normal.     Ortho Exam patient shortening left leg about  an inch in the sitting position.  Distal pulses are intact sciatic function is intact.  She has mild discomfort with internal and external rotation of her hip.  Posterior hip incision is well-healed that extends all the way down to her knee.  Specialty Comments:  No specialty comments available.  Imaging: Xr Femur Min 2 Views Left  Result Date: 09/16/2018 Multiple views left femur obtained and reviewed.  This shows lateral plate fixation of periprosthetic femur fracture with total of 10 cables 10 screws in satisfactory position with intact femoral tube without displacement of fractures.  Femoral stem subsidence with  rotation and dislocation of the left total hip. Impression: Left total hip arthroplasty dislocation with some subsidence measured 4 to 5 mm and rotation of the femoral stem.  Xr Hip Unilat W Or W/o Pelvis 2-3 Views Left  Result Date: 09/16/2018 AP pelvis x-rays obtained and reviewed.  This shows hip dislocation of the left hip with rotation of the femoral stem acetabulum is in good position with 2 screws present.  Right total hip arthroplasty shows good position alignment.  Multiple cables and screws are present in the left femur unchanged from previous x-ray. Impression: Left total hip arthroplasty dislocation with some subsidence and rotation of the femoral component.    PMFS History: Patient Active Problem List   Diagnosis Date Noted  . Symptomatic anemia   . Ambulatory dysfunction 07/21/2018  . Weakness of both legs 07/21/2018  . Proteus mirabilis infection   . Hip hematoma, left 06/19/2018  . Hematoma of left hip 06/19/2018  . Hip dislocation, left ([place]) 05/29/2018  . Peri-prosthetic femur fracture at tip of prosthesis 05/29/2018  . Prediabetes   . Popping sound of knee joint   . Hypoalbuminemia due to protein-calorie malnutrition ([place])   . Transaminitis   . Leukocytosis   . Postoperative pain   . Failure of left total hip arthroplasty ([place]) 05/01/2018  . Acute  blood loss anemia   . Leukemoid reaction   . 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 Joint effusion of pelvis or thigh, left 04/22/2018  . Trochanteric bursitis, left hip 04/22/2018  . History of left hip replacement 11/12/2017  . Bilateral primary osteoarthritis of knee 11/12/2017  . GERD (gastroesophageal reflux disease) 01/17/2017  . Hypothyroidism 01/17/2017  . Vertigo 01/16/2017  . Nausea and vomiting 01/16/2017  . Essential hypertension 01/16/2017  . Fatty liver 01/16/2017  . Failed total hip arthroplasty ([place]) 06/10/2016  . Pelvic mass 06/15/2015  . Closed wedge compression fracture of fifth lumbar vertebra ([place])   . Cervical spondylosis 02/09/2014  . DDD (degenerative disc disease), lumbar 05/13/2013  . OA (osteoarthritis) 05/13/2013   Past Medical History:  Diagnosis Date  . Arthritis    "all over my body" (04/29/2018)  . Chronic lower back pain   . Fatty liver   . Fever blister    Takes Acyclovir  . GERD (gastroesophageal reflux disease)   . Hyperlipemia   . Hypothyroidism   . Migraines   . Osteopenia   . Pneumonia    "once; years ago" (04/29/2018)  . PONV (postoperative nausea and vomiting)   . Pre-diabetes   . Seasonal allergies   . Vertigo     Family History  Problem Relation Age of Onset  . Lymphoma Mother     Past Surgical History:  Procedure Laterality Date  . ANTERIOR CERVICAL DECOMP/DISCECTOMY FUSION N/A 02/09/2014   Procedure: ANTERIOR CERVICAL DECOMPRESSION/DISCECTOMY FUSION 2 LEVELS;  Surgeon: [name], MD;  Location: [place];  Service: Orthopedics;  Laterality: N/A;  C4-5, C5-6 Anterior Cervical Discectomy and Fusion, Allograft, Plate  . BACK SURGERY    . CARPAL TUNNEL RELEASE Right 2018  . CATARACT EXTRACTION W/ INTRAOCULAR LENS  IMPLANT, BILATERAL    . COLONOSCOPY    . FIXATION KYPHOPLASTY LUMBAR SPINE  X 2  . HIP ARTHROPLASTY Left 2011  . INCISION AND DRAINAGE HIP Left 06/19/2018   Procedure: IRRIGATION AND DEBRIDEMENT LEFT HIP HEMATOMA ANTIBIOTIC  BEADS, APPLICATION ON WOUND VAC;  Surgeon: [name], MD;  Location: [place];  Service: Orthopedics;  Laterality: Left;  . JOINT REPLACEMENT    .  KNEE ARTHROSCOPY Right   . ORIF PERIPROSTHETIC FRACTURE Left 06/01/2018   Procedure: OPEN REDUCTION INTERNAL FIXATION (ORIF) PERIPROSTHETIC FEMUR FRACTURE;  Surgeon: [name], MD;  Location: [place];  Service: Orthopedics;  Laterality: Left;  . PATELLA FRACTURE SURGERY Right ~ 1990   "put metal in"  . PATELLA HARDWARE REMOVAL Right    "took the metal out"  . REVISION TOTAL HIP ARTHROPLASTY Left 04/29/2018  . ROBOTIC ASSISTED BILATERAL SALPINGO OOPHERECTOMY Bilateral 08/01/2015   Procedure: ROBOTIC ASSISTED BILATERAL SALPINGO OOPHORECTOMY;  Surgeon: Everitt Amber, MD;  Location: [facility];  Service: Gynecology;  Laterality: Bilateral;  . SHOULDER OPEN ROTATOR CUFF REPAIR Right    3 TOTAL  . SHOULDER SURGERY Right   . TOTAL HIP ARTHROPLASTY Right 01/21/2018   Procedure: RIGHT TOTAL HIP ARTHROPLASTY DIRECT ANTERIOR;  Surgeon: [name], MD;  Location: [place];  Service: Orthopedics;  Laterality: Right;  . TOTAL HIP REVISION Left 06/10/2016   Procedure: TOTAL HIP REVISION ARTHROPLASTY;  Surgeon: Rod Can, MD;  Location: [place];  Service: Orthopedics;  Laterality: Left;  . TOTAL HIP REVISION Left 04/29/2018   Procedure: left total hip arthroplasty revision posterior approach;  Surgeon: [name], MD;  Location: [place];  Service: Orthopedics;  Laterality: Left;  . WRIST GANGLION EXCISION Left 1970s   Social History   Occupational History  . Not on file  Tobacco Use  . Smoking status: Never Smoker  . Smokeless tobacco: Never Used  Substance and Sexual Activity  . Alcohol use: Yes    Comment: 04/29/2018 "maybe 2 drinks/year"  . Drug use: Never  . Sexual activity: Not Currently

## 2018-09-17 ENCOUNTER — Encounter (HOSPITAL_COMMUNITY): Payer: Self-pay | Admitting: *Deleted

## 2018-09-17 ENCOUNTER — Other Ambulatory Visit: Payer: Self-pay

## 2018-09-17 NOTE — Progress Notes (Addendum)
Emma Lewis denies chest pain or shortness of breath. PCP is Dr Sabra Heck with [facility], I requested reports. I instructed patient to hold Vitamins and Ibuprofen.  Emma Lewis said she has to take Ibuprofen for pain, I informed that she needs to check with surgeon to see if he is okay with her taking Ibuprofen or if he will order a different medication.

## 2018-09-21 ENCOUNTER — Inpatient Hospital Stay (HOSPITAL_COMMUNITY)
Admission: RE | Admit: 2018-09-21 | Discharge: 2018-09-23 | DRG: 466 | Disposition: A | Discharge status: Home / Self Care | Payer: Medicare Other | Attending: Orthopaedic Surgery | Admitting: Orthopaedic Surgery

## 2018-09-21 ENCOUNTER — Encounter (HOSPITAL_COMMUNITY): Payer: Self-pay

## 2018-09-21 ENCOUNTER — Inpatient Hospital Stay (HOSPITAL_COMMUNITY): Payer: Medicare Other

## 2018-09-21 ENCOUNTER — Other Ambulatory Visit: Payer: Self-pay

## 2018-09-21 ENCOUNTER — Inpatient Hospital Stay (HOSPITAL_COMMUNITY): Payer: Medicare Other | Admitting: Anesthesiology

## 2018-09-21 ENCOUNTER — Encounter (HOSPITAL_COMMUNITY)
Admission: RE | Disposition: A | Discharge status: Home / Self Care | Payer: Self-pay | Source: Home / Self Care | Attending: Orthopaedic Surgery

## 2018-09-21 DIAGNOSIS — T84021D Dislocation of internal left hip prosthesis, subsequent encounter: Secondary | ICD-10-CM

## 2018-09-21 DIAGNOSIS — S7292XA Unspecified fracture of left femur, initial encounter for closed fracture: Secondary | ICD-10-CM | POA: Diagnosis present

## 2018-09-21 DIAGNOSIS — K760 Fatty (change of) liver, not elsewhere classified: Secondary | ICD-10-CM | POA: Diagnosis present

## 2018-09-21 DIAGNOSIS — K219 Gastro-esophageal reflux disease without esophagitis: Secondary | ICD-10-CM | POA: Diagnosis present

## 2018-09-21 DIAGNOSIS — Z96642 Presence of left artificial hip joint: Secondary | ICD-10-CM

## 2018-09-21 DIAGNOSIS — Z981 Arthrodesis status: Secondary | ICD-10-CM | POA: Diagnosis not present

## 2018-09-21 DIAGNOSIS — G43909 Migraine, unspecified, not intractable, without status migrainosus: Secondary | ICD-10-CM | POA: Diagnosis present

## 2018-09-21 DIAGNOSIS — E785 Hyperlipidemia, unspecified: Secondary | ICD-10-CM | POA: Diagnosis present

## 2018-09-21 DIAGNOSIS — T84011A Broken internal left hip prosthesis, initial encounter: Secondary | ICD-10-CM | POA: Diagnosis not present

## 2018-09-21 DIAGNOSIS — M170 Bilateral primary osteoarthritis of knee: Secondary | ICD-10-CM | POA: Diagnosis present

## 2018-09-21 DIAGNOSIS — Z961 Presence of intraocular lens: Secondary | ICD-10-CM | POA: Diagnosis present

## 2018-09-21 DIAGNOSIS — M25552 Pain in left hip: Secondary | ICD-10-CM | POA: Diagnosis not present

## 2018-09-21 DIAGNOSIS — G8929 Other chronic pain: Secondary | ICD-10-CM | POA: Diagnosis present

## 2018-09-21 DIAGNOSIS — T84021A Dislocation of internal left hip prosthesis, initial encounter: Secondary | ICD-10-CM | POA: Diagnosis not present

## 2018-09-21 DIAGNOSIS — I10 Essential (primary) hypertension: Secondary | ICD-10-CM | POA: Diagnosis not present

## 2018-09-21 DIAGNOSIS — R7303 Prediabetes: Secondary | ICD-10-CM | POA: Diagnosis present

## 2018-09-21 DIAGNOSIS — Y792 Prosthetic and other implants, materials and accessory orthopedic devices associated with adverse incidents: Secondary | ICD-10-CM | POA: Diagnosis present

## 2018-09-21 DIAGNOSIS — M7989 Other specified soft tissue disorders: Secondary | ICD-10-CM | POA: Diagnosis not present

## 2018-09-21 DIAGNOSIS — M810 Age-related osteoporosis without current pathological fracture: Secondary | ICD-10-CM | POA: Diagnosis not present

## 2018-09-21 DIAGNOSIS — E039 Hypothyroidism, unspecified: Secondary | ICD-10-CM | POA: Diagnosis present

## 2018-09-21 DIAGNOSIS — Z9841 Cataract extraction status, right eye: Secondary | ICD-10-CM | POA: Diagnosis not present

## 2018-09-21 DIAGNOSIS — Z9889 Other specified postprocedural states: Secondary | ICD-10-CM

## 2018-09-21 DIAGNOSIS — J302 Other seasonal allergic rhinitis: Secondary | ICD-10-CM | POA: Diagnosis present

## 2018-09-21 DIAGNOSIS — Z471 Aftercare following joint replacement surgery: Secondary | ICD-10-CM | POA: Diagnosis not present

## 2018-09-21 DIAGNOSIS — M9702XA Periprosthetic fracture around internal prosthetic left hip joint, initial encounter: Secondary | ICD-10-CM | POA: Diagnosis present

## 2018-09-21 DIAGNOSIS — X58XXXA Exposure to other specified factors, initial encounter: Secondary | ICD-10-CM | POA: Diagnosis present

## 2018-09-21 DIAGNOSIS — Z9842 Cataract extraction status, left eye: Secondary | ICD-10-CM | POA: Diagnosis not present

## 2018-09-21 DIAGNOSIS — Z8701 Personal history of pneumonia (recurrent): Secondary | ICD-10-CM | POA: Diagnosis not present

## 2018-09-21 DIAGNOSIS — Z419 Encounter for procedure for purposes other than remedying health state, unspecified: Secondary | ICD-10-CM

## 2018-09-21 HISTORY — PX: TOTAL HIP REVISION: SHX763

## 2018-09-21 HISTORY — DX: Allergy, unspecified, initial encounter: T78.40XA

## 2018-09-21 LAB — CBC
HCT: 42.4 % (ref 36.0–46.0)
HEMOGLOBIN: 13.5 g/dL (ref 12.0–15.0)
MCH: 28.5 pg (ref 26.0–34.0)
MCHC: 31.8 g/dL (ref 30.0–36.0)
MCV: 89.5 fL (ref 80.0–100.0)
Platelets: 274 10*3/uL (ref 150–400)
RBC: 4.74 MIL/uL (ref 3.87–5.11)
RDW: 22.7 % — ABNORMAL HIGH (ref 11.5–15.5)
WBC: 7.5 10*3/uL (ref 4.0–10.5)
nRBC: 0 % (ref 0.0–0.2)

## 2018-09-21 LAB — COMPREHENSIVE METABOLIC PANEL
ALK PHOS: 233 U/L — AB (ref 38–126)
ALT: 47 U/L — ABNORMAL HIGH (ref 0–44)
AST: 66 U/L — ABNORMAL HIGH (ref 15–41)
Albumin: 3.6 g/dL (ref 3.5–5.0)
Anion gap: 7 (ref 5–15)
BUN: 14 mg/dL (ref 8–23)
CO2: 25 mmol/L (ref 22–32)
Calcium: 10.1 mg/dL (ref 8.9–10.3)
Chloride: 106 mmol/L (ref 98–111)
Creatinine, Ser: 0.7 mg/dL (ref 0.44–1.00)
GFR calc Af Amer: >60 mL/min (ref >60)
GFR calc non Af Amer: >60 mL/min (ref >60)
Glucose, Bld: 95 mg/dL (ref 70–99)
Potassium: 3.7 mmol/L (ref 3.5–5.1)
Sodium: 138 mmol/L (ref 135–145)
Total Bilirubin: 0.8 mg/dL (ref 0.3–1.2)
Total Protein: 8.6 g/dL — ABNORMAL HIGH (ref 6.5–8.1)

## 2018-09-21 LAB — TYPE AND SCREEN
ABO/RH(D): A POS
Antibody Screen: NEGATIVE

## 2018-09-21 LAB — SURGICAL PCR SCREEN
MRSA, PCR: NEGATIVE
Staphylococcus aureus: NEGATIVE

## 2018-09-21 SURGERY — TOTAL HIP REVISION
Anesthesia: General | Site: Hip | Laterality: Left

## 2018-09-21 MED ORDER — MIDAZOLAM HCL 5 MG/5ML IJ SOLN
INTRAMUSCULAR | Status: DC | PRN
  Administered 2018-09-21: 2 mg via INTRAVENOUS

## 2018-09-21 MED ORDER — OXYCODONE HCL 5 MG PO TABS: 5.0000 mg | ORAL_TABLET | Freq: Once | ORAL | Status: DC | PRN

## 2018-09-21 MED ORDER — METHOCARBAMOL 500 MG PO TABS
500.0000 mg | ORAL_TABLET | Freq: Four times a day (QID) | ORAL | Status: DC | PRN
  Administered 2018-09-21: 500 mg via ORAL

## 2018-09-21 MED ORDER — OXYCODONE HCL 5 MG PO TABS: 10.0000 mg | ORAL_TABLET | ORAL | Status: DC | PRN

## 2018-09-21 MED ORDER — SODIUM CHLORIDE 0.9 % IV SOLN
INTRAVENOUS | Status: DC | PRN
  Administered 2018-09-21: 25 ug/min via INTRAVENOUS

## 2018-09-21 MED ORDER — OMEGA-3 FISH OIL 1000 MG PO CAPS: 1.0000 | ORAL_CAPSULE | Freq: Two times a day (BID) | ORAL | Status: DC

## 2018-09-21 MED ORDER — ACETAMINOPHEN 325 MG PO TABS: 325.0000 mg | ORAL_TABLET | Freq: Four times a day (QID) | ORAL | Status: DC | PRN

## 2018-09-21 MED ORDER — CARBOXYMETHYLCELLULOSE SODIUM 0.25 % OP SOLN: 1.0000 [drp] | Freq: Two times a day (BID) | OPHTHALMIC | Status: DC | PRN

## 2018-09-21 MED ORDER — METHOCARBAMOL 1000 MG/10ML IJ SOLN
500.0000 mg | Freq: Four times a day (QID) | INTRAVENOUS | Status: DC | PRN
  Filled 2018-09-21: qty 5

## 2018-09-21 MED ORDER — ACETAMINOPHEN 500 MG PO TABS
1000.0000 mg | ORAL_TABLET | Freq: Four times a day (QID) | ORAL | Status: AC
  Administered 2018-09-21 – 2018-09-22 (×4): 1000 mg via ORAL
  Filled 2018-09-21 (×4): qty 2

## 2018-09-21 MED ORDER — FENTANYL CITRATE (PF) 100 MCG/2ML IJ SOLN
25.0000 ug | INTRAMUSCULAR | Status: DC | PRN
  Administered 2018-09-21 (×2): 50 ug via INTRAVENOUS

## 2018-09-21 MED ORDER — 0.9 % SODIUM CHLORIDE (POUR BTL) OPTIME
TOPICAL | Status: DC | PRN
  Administered 2018-09-21: 1000 mL

## 2018-09-21 MED ORDER — PROMETHAZINE HCL 25 MG/ML IJ SOLN
INTRAMUSCULAR | Status: AC
  Filled 2018-09-21: qty 1

## 2018-09-21 MED ORDER — METOCLOPRAMIDE HCL 5 MG PO TABS: 5.0000 mg | ORAL_TABLET | Freq: Three times a day (TID) | ORAL | Status: DC | PRN

## 2018-09-21 MED ORDER — SIMVASTATIN 20 MG PO TABS
40.0000 mg | ORAL_TABLET | Freq: Every day | ORAL | Status: DC
  Administered 2018-09-21 – 2018-09-22 (×2): 40 mg via ORAL
  Filled 2018-09-21 (×2): qty 2

## 2018-09-21 MED ORDER — ASPIRIN EC 325 MG PO TBEC
325.0000 mg | DELAYED_RELEASE_TABLET | Freq: Every day | ORAL | Status: DC
  Administered 2018-09-22 – 2018-09-23 (×2): 325 mg via ORAL
  Filled 2018-09-21 (×2): qty 1

## 2018-09-21 MED ORDER — OXYCODONE HCL 5 MG PO TABS
5.0000 mg | ORAL_TABLET | ORAL | Status: DC | PRN
  Administered 2018-09-21 – 2018-09-22 (×2): 10 mg via ORAL
  Administered 2018-09-22: 5 mg via ORAL
  Administered 2018-09-23 (×2): 10 mg via ORAL
  Filled 2018-09-21 (×4): qty 2

## 2018-09-21 MED ORDER — SODIUM CHLORIDE 0.9 % IR SOLN
Status: DC | PRN
  Administered 2018-09-21: 3000 mL

## 2018-09-21 MED ORDER — VANCOMYCIN HCL 1000 MG IV SOLR
INTRAVENOUS | Status: AC
  Filled 2018-09-21: qty 1000

## 2018-09-21 MED ORDER — ROCURONIUM BROMIDE 50 MG/5ML IV SOSY
PREFILLED_SYRINGE | INTRAVENOUS | Status: DC | PRN
  Administered 2018-09-21: 50 mg via INTRAVENOUS

## 2018-09-21 MED ORDER — CHLORHEXIDINE GLUCONATE 4 % EX LIQD: 60.0000 mL | Freq: Once | CUTANEOUS | Status: DC

## 2018-09-21 MED ORDER — LIDOCAINE HCL (CARDIAC) PF 100 MG/5ML IV SOSY
PREFILLED_SYRINGE | INTRAVENOUS | Status: DC | PRN
  Administered 2018-09-21: 30 mg via INTRAVENOUS

## 2018-09-21 MED ORDER — TRAMADOL HCL 50 MG PO TABS
50.0000 mg | ORAL_TABLET | Freq: Four times a day (QID) | ORAL | Status: DC
  Administered 2018-09-21 – 2018-09-23 (×9): 50 mg via ORAL
  Filled 2018-09-21 (×9): qty 1

## 2018-09-21 MED ORDER — ACYCLOVIR 400 MG PO TABS
400.0000 mg | ORAL_TABLET | Freq: Every day | ORAL | Status: DC
  Administered 2018-09-22 – 2018-09-23 (×2): 400 mg via ORAL
  Filled 2018-09-21 (×2): qty 1

## 2018-09-21 MED ORDER — FLEET ENEMA 7-19 GM/118ML RE ENEM: 1.0000 | ENEMA | Freq: Once | RECTAL | Status: DC | PRN

## 2018-09-21 MED ORDER — DIPHENHYDRAMINE HCL 12.5 MG/5ML PO ELIX: 12.5000 mg | ORAL_SOLUTION | ORAL | Status: DC | PRN

## 2018-09-21 MED ORDER — SUGAMMADEX SODIUM 200 MG/2ML IV SOLN
INTRAVENOUS | Status: DC | PRN
  Administered 2018-09-21: 172.4 mg via INTRAVENOUS

## 2018-09-21 MED ORDER — METHOCARBAMOL 500 MG PO TABS
ORAL_TABLET | ORAL | Status: AC
  Filled 2018-09-21: qty 1

## 2018-09-21 MED ORDER — PROPOFOL 10 MG/ML IV BOLUS
INTRAVENOUS | Status: AC
  Filled 2018-09-21: qty 20

## 2018-09-21 MED ORDER — FENTANYL CITRATE (PF) 100 MCG/2ML IJ SOLN
INTRAMUSCULAR | Status: AC
Start: 2018-09-21 — End: 2018-09-22
  Filled 2018-09-21: qty 2

## 2018-09-21 MED ORDER — BUPIVACAINE HCL (PF) 0.25 % IJ SOLN
INTRAMUSCULAR | Status: DC | PRN
  Administered 2018-09-21: 21 mL

## 2018-09-21 MED ORDER — METOCLOPRAMIDE HCL 5 MG/ML IJ SOLN: 5.0000 mg | Freq: Three times a day (TID) | INTRAMUSCULAR | Status: DC | PRN

## 2018-09-21 MED ORDER — PANTOPRAZOLE SODIUM 20 MG PO TBEC
20.0000 mg | DELAYED_RELEASE_TABLET | Freq: Every day | ORAL | Status: DC
  Administered 2018-09-22 – 2018-09-23 (×2): 20 mg via ORAL
  Filled 2018-09-21 (×2): qty 1

## 2018-09-21 MED ORDER — FERROUS SULFATE 325 (65 FE) MG PO TABS
325.0000 mg | ORAL_TABLET | Freq: Two times a day (BID) | ORAL | Status: DC
  Administered 2018-09-21 – 2018-09-23 (×5): 325 mg via ORAL
  Filled 2018-09-21 (×5): qty 1

## 2018-09-21 MED ORDER — LEVOTHYROXINE SODIUM 100 MCG PO TABS
100.0000 ug | ORAL_TABLET | Freq: Every day | ORAL | Status: DC
  Administered 2018-09-22 – 2018-09-23 (×2): 100 ug via ORAL
  Filled 2018-09-21 (×2): qty 1

## 2018-09-21 MED ORDER — ONDANSETRON HCL 4 MG/2ML IJ SOLN: 4.0000 mg | Freq: Four times a day (QID) | INTRAMUSCULAR | Status: DC | PRN

## 2018-09-21 MED ORDER — FENTANYL CITRATE (PF) 100 MCG/2ML IJ SOLN
INTRAMUSCULAR | Status: DC | PRN
  Administered 2018-09-21 (×2): 100 ug via INTRAVENOUS
  Administered 2018-09-21: 50 ug via INTRAVENOUS

## 2018-09-21 MED ORDER — LACTATED RINGERS IV SOLN
INTRAVENOUS | Status: DC
  Administered 2018-09-21 (×2): via INTRAVENOUS

## 2018-09-21 MED ORDER — CEFAZOLIN SODIUM-DEXTROSE 2-4 GM/100ML-% IV SOLN
2.0000 g | INTRAVENOUS | Status: AC
  Administered 2018-09-21: 2 g via INTRAVENOUS
  Filled 2018-09-21: qty 100

## 2018-09-21 MED ORDER — VITAMIN D3 25 MCG (1000 UNIT) PO TABS
2000.0000 [IU] | ORAL_TABLET | Freq: Two times a day (BID) | ORAL | Status: DC
  Administered 2018-09-21 – 2018-09-23 (×4): 2000 [IU] via ORAL
  Filled 2018-09-21 (×9): qty 2

## 2018-09-21 MED ORDER — OMEGA-3-ACID ETHYL ESTERS 1 G PO CAPS
1.0000 g | ORAL_CAPSULE | Freq: Two times a day (BID) | ORAL | Status: DC
  Administered 2018-09-21 – 2018-09-23 (×4): 1 g via ORAL
  Filled 2018-09-21 (×4): qty 1

## 2018-09-21 MED ORDER — IBUPROFEN 200 MG PO TABS: 800.0000 mg | ORAL_TABLET | Freq: Four times a day (QID) | ORAL | Status: DC | PRN

## 2018-09-21 MED ORDER — HYDROMORPHONE HCL 1 MG/ML IJ SOLN
0.5000 mg | INTRAMUSCULAR | Status: DC | PRN
  Administered 2018-09-21: 1 mg via INTRAVENOUS
  Filled 2018-09-21: qty 1

## 2018-09-21 MED ORDER — DOCUSATE SODIUM 100 MG PO CAPS
100.0000 mg | ORAL_CAPSULE | Freq: Two times a day (BID) | ORAL | Status: DC
  Administered 2018-09-22 – 2018-09-23 (×2): 100 mg via ORAL
  Filled 2018-09-21 (×4): qty 1

## 2018-09-21 MED ORDER — CALCIUM CARBONATE-VITAMIN D 500-200 MG-UNIT PO TABS
ORAL_TABLET | Freq: Two times a day (BID) | ORAL | Status: DC
  Administered 2018-09-21: 1 via ORAL
  Filled 2018-09-21: qty 1

## 2018-09-21 MED ORDER — NYSTATIN-TRIAMCINOLONE 100000-0.1 UNIT/GM-% EX CREA
1.0000 "application " | TOPICAL_CREAM | Freq: Two times a day (BID) | CUTANEOUS | Status: DC | PRN
  Filled 2018-09-21: qty 15

## 2018-09-21 MED ORDER — ONDANSETRON HCL 4 MG PO TABS: 4.0000 mg | ORAL_TABLET | Freq: Four times a day (QID) | ORAL | Status: DC | PRN

## 2018-09-21 MED ORDER — POLYETHYLENE GLYCOL 3350 17 G PO PACK: 17.0000 g | PACK | Freq: Every day | ORAL | Status: DC | PRN

## 2018-09-21 MED ORDER — CEFAZOLIN SODIUM-DEXTROSE 2-4 GM/100ML-% IV SOLN
2.0000 g | Freq: Four times a day (QID) | INTRAVENOUS | Status: AC
  Administered 2018-09-21 – 2018-09-22 (×2): 2 g via INTRAVENOUS
  Filled 2018-09-21 (×2): qty 100

## 2018-09-21 MED ORDER — VANCOMYCIN HCL 1000 MG IV SOLR
INTRAVENOUS | Status: DC | PRN
  Administered 2018-09-21: 1000 mg

## 2018-09-21 MED ORDER — OMEPRAZOLE MAGNESIUM 20 MG PO TBEC: 10.0000 mg | DELAYED_RELEASE_TABLET | Freq: Every day | ORAL | Status: DC

## 2018-09-21 MED ORDER — PHENOL 1.4 % MT LIQD: 1.0000 | OROMUCOSAL | Status: DC | PRN

## 2018-09-21 MED ORDER — CIPROFLOXACIN HCL 500 MG PO TABS
750.0000 mg | ORAL_TABLET | Freq: Two times a day (BID) | ORAL | Status: DC
  Administered 2018-09-21 – 2018-09-23 (×4): 750 mg via ORAL
  Filled 2018-09-21 (×4): qty 2

## 2018-09-21 MED ORDER — OXYCODONE HCL 5 MG PO TABS
ORAL_TABLET | ORAL | Status: AC
Start: 2018-09-21 — End: 2018-09-22
  Filled 2018-09-21: qty 2

## 2018-09-21 MED ORDER — ONDANSETRON HCL 4 MG/2ML IJ SOLN
INTRAMUSCULAR | Status: DC | PRN
  Administered 2018-09-21: 4 mg via INTRAVENOUS

## 2018-09-21 MED ORDER — BIOTIN PLUS KERATIN 10000-100 MCG-MG PO TABS: 1.0000 | ORAL_TABLET | Freq: Three times a day (TID) | ORAL | Status: DC

## 2018-09-21 MED ORDER — BUPIVACAINE HCL (PF) 0.25 % IJ SOLN
INTRAMUSCULAR | Status: AC
  Filled 2018-09-21: qty 30

## 2018-09-21 MED ORDER — FENTANYL CITRATE (PF) 250 MCG/5ML IJ SOLN
INTRAMUSCULAR | Status: AC
  Filled 2018-09-21: qty 5

## 2018-09-21 MED ORDER — MENTHOL 3 MG MT LOZG: 1.0000 | LOZENGE | OROMUCOSAL | Status: DC | PRN

## 2018-09-21 MED ORDER — BISACODYL 10 MG RE SUPP: 10.0000 mg | Freq: Every day | RECTAL | Status: DC | PRN

## 2018-09-21 MED ORDER — OXYCODONE HCL 5 MG/5ML PO SOLN: 5.0000 mg | Freq: Once | ORAL | Status: DC | PRN

## 2018-09-21 MED ORDER — CLOBETASOL PROPIONATE 0.05 % EX CREA
1.0000 "application " | TOPICAL_CREAM | Freq: Two times a day (BID) | CUTANEOUS | Status: DC
  Administered 2018-09-21 – 2018-09-23 (×4): 1 via TOPICAL
  Filled 2018-09-21: qty 15

## 2018-09-21 MED ORDER — ALBUMIN HUMAN 5 % IV SOLN
INTRAVENOUS | Status: DC | PRN
  Administered 2018-09-21: 14:00:00 via INTRAVENOUS

## 2018-09-21 MED ORDER — PROPOFOL 10 MG/ML IV BOLUS
INTRAVENOUS | Status: DC | PRN
  Administered 2018-09-21: 150 mg via INTRAVENOUS

## 2018-09-21 MED ORDER — PROMETHAZINE HCL 25 MG/ML IJ SOLN
6.2500 mg | INTRAMUSCULAR | Status: DC | PRN
  Administered 2018-09-21: 12.5 mg via INTRAVENOUS

## 2018-09-21 SURGICAL SUPPLY — 69 items
APL SKNCLS STERI-STRIP NONHPOA (GAUZE/BANDAGES/DRESSINGS) ×1
ARTICULEZE HEAD (Hips) ×3 IMPLANT
BENZOIN TINCTURE PRP APPL 2/3 (GAUZE/BANDAGES/DRESSINGS) ×3 IMPLANT
BRUSH FEMORAL CANAL (MISCELLANEOUS) ×2 IMPLANT
CEMENT HV SMART SET (Cement) ×6 IMPLANT
CEMENT RESTRICTOR DEPUY SZ 4 (Cement) ×2 IMPLANT
CHLORAPREP W/TINT 26ML (MISCELLANEOUS) ×4 IMPLANT
CONT SPEC 4OZ CLIKSEAL STRL BL (MISCELLANEOUS) ×2 IMPLANT
COVER SURGICAL LIGHT HANDLE (MISCELLANEOUS) ×3 IMPLANT
COVER WAND RF STERILE (DRAPES) ×3 IMPLANT
DRAPE C-ARM 42X72 X-RAY (DRAPES) ×2 IMPLANT
DRAPE IMP U-DRAPE 54X76 (DRAPES) ×3 IMPLANT
DRAPE INCISE IOBAN 66X45 STRL (DRAPES) ×2 IMPLANT
DRAPE ORTHO SPLIT 77X108 STRL (DRAPES) ×6
DRAPE SURG ORHT 6 SPLT 77X108 (DRAPES) ×2 IMPLANT
DRAPE U-SHAPE 47X51 STRL (DRAPES) ×3 IMPLANT
DRSG PAD ABDOMINAL 8X10 ST (GAUZE/BANDAGES/DRESSINGS) ×6 IMPLANT
ELECT BLADE 4.0 EZ CLEAN MEGAD (MISCELLANEOUS) ×3
ELECT CAUTERY BLADE 6.4 (BLADE) ×3 IMPLANT
ELECT REM PT RETURN 9FT ADLT (ELECTROSURGICAL) ×3
ELECTRODE BLDE 4.0 EZ CLN MEGD (MISCELLANEOUS) IMPLANT
ELECTRODE REM PT RTRN 9FT ADLT (ELECTROSURGICAL) ×1 IMPLANT
EVACUATOR 1/8 PVC DRAIN (DRAIN) IMPLANT
FACESHIELD WRAPAROUND (MASK) ×9 IMPLANT
FACESHIELD WRAPAROUND OR TEAM (MASK) ×2 IMPLANT
GAUZE SPONGE 4X4 12PLY STRL (GAUZE/BANDAGES/DRESSINGS) ×3 IMPLANT
GAUZE XEROFORM 5X9 LF (GAUZE/BANDAGES/DRESSINGS) ×3 IMPLANT
GLOVE BIOGEL PI IND STRL 8 (GLOVE) ×2 IMPLANT
GLOVE BIOGEL PI INDICATOR 8 (GLOVE) ×4
GLOVE ORTHO TXT STRL SZ7.5 (GLOVE) ×6 IMPLANT
GLOVE SS BIOGEL STRL SZ 7.5 (GLOVE) IMPLANT
GLOVE SUPERSENSE BIOGEL SZ 7.5 (GLOVE) ×2
GOWN STRL REUS W/ TWL LRG LVL3 (GOWN DISPOSABLE) ×1 IMPLANT
GOWN STRL REUS W/ TWL XL LVL3 (GOWN DISPOSABLE) ×1 IMPLANT
GOWN STRL REUS W/TWL 2XL LVL3 (GOWN DISPOSABLE) ×3 IMPLANT
GOWN STRL REUS W/TWL LRG LVL3 (GOWN DISPOSABLE) ×3
GOWN STRL REUS W/TWL XL LVL3 (GOWN DISPOSABLE) ×3
HANDPIECE INTERPULSE COAX TIP (DISPOSABLE) ×3
HEAD ARTICULEZE (Hips) IMPLANT
IMMOBILIZER KNEE 22 UNIV (SOFTGOODS) ×2 IMPLANT
KIT BASIN OR (CUSTOM PROCEDURE TRAY) ×3 IMPLANT
KIT TURNOVER KIT B (KITS) ×3 IMPLANT
MANIFOLD NEPTUNE II (INSTRUMENTS) ×3 IMPLANT
NDL 1/2 CIR MAYO (NEEDLE) ×1 IMPLANT
NDL HYPO 25GX1X1/2 BEV (NEEDLE) ×1 IMPLANT
NEEDLE 1/2 CIR MAYO (NEEDLE) ×3 IMPLANT
NEEDLE HYPO 25GX1X1/2 BEV (NEEDLE) ×3 IMPLANT
NS IRRIG 1000ML POUR BTL (IV SOLUTION) ×3 IMPLANT
PACK TOTAL JOINT (CUSTOM PROCEDURE TRAY) ×3 IMPLANT
PACK UNIVERSAL I (CUSTOM PROCEDURE TRAY) ×3 IMPLANT
PAD ARMBOARD 7.5X6 YLW CONV (MISCELLANEOUS) ×8 IMPLANT
REAMER ROD DEEP FLUTE 2.5X950 (INSTRUMENTS) IMPLANT
SET HNDPC FAN SPRY TIP SCT (DISPOSABLE) IMPLANT
SPONGE LAP 4X18 RFD (DISPOSABLE) ×6 IMPLANT
STAPLER VISISTAT 35W (STAPLE) ×3 IMPLANT
SUCTION FRAZIER HANDLE 10FR (MISCELLANEOUS) ×2
SUCTION TUBE FRAZIER 10FR DISP (MISCELLANEOUS) ×1 IMPLANT
SUT ETHIBOND NAB CT1 #1 30IN (SUTURE) ×9 IMPLANT
SUT TICRON (SUTURE) ×6 IMPLANT
SUT VIC AB 1 CT1 27 (SUTURE) ×6
SUT VIC AB 1 CT1 27XBRD ANBCTR (SUTURE) IMPLANT
SUT VIC AB 2-0 CT1 27 (SUTURE) ×9
SUT VIC AB 2-0 CT1 TAPERPNT 27 (SUTURE) ×3 IMPLANT
SUT VICRYL 0 TIES 12 18 (SUTURE) ×3 IMPLANT
SYR CONTROL 10ML LL (SYRINGE) ×3 IMPLANT
TOWEL OR 17X26 10 PK STRL BLUE (TOWEL DISPOSABLE) ×3 IMPLANT
TOWER CARTRIDGE SMART MIX (DISPOSABLE) ×2 IMPLANT
TRAY FOLEY MTR SLVR 16FR STAT (SET/KITS/TRAYS/PACK) ×2 IMPLANT
WATER STERILE IRR 1000ML POUR (IV SOLUTION) ×6 IMPLANT

## 2018-09-21 NOTE — Anesthesia Preprocedure Evaluation (Signed)
Anesthesia Evaluation  Patient identified by MRN, date of birth, ID band Patient awake    Reviewed: Allergy & Precautions, NPO status , Patient's Chart, lab work & pertinent test results  History of Anesthesia Complications (+) PONV and history of anesthetic complications  Airway Mallampati: II  TM Distance: >3 FB Neck ROM: Full    Dental no notable dental hx. (+) Teeth Intact   Pulmonary neg pulmonary ROS,    Pulmonary exam normal        Cardiovascular hypertension, Normal cardiovascular exam     Neuro/Psych negative neurological ROS  negative psych ROS   GI/Hepatic Neg liver ROS, GERD  ,  Endo/Other  Hypothyroidism   Renal/GU negative Renal ROS  negative genitourinary   Musculoskeletal  (+) Arthritis , Osteoarthritis,    Abdominal   Peds  Hematology negative hematology ROS (+)   Anesthesia Other Findings   Reproductive/Obstetrics                             Anesthesia Physical  Anesthesia Plan  ASA: II  Anesthesia Plan: General   Post-op Pain Management:    Induction: Intravenous  PONV Risk Score and Plan: 4 or greater and Ondansetron, Dexamethasone, Treatment may vary due to age or medical condition and Scopolamine patch - Pre-op  Airway Management Planned: Oral ETT  Additional Equipment: None  Intra-op Plan:   Post-operative Plan: Extubation in OR  Informed Consent: I have reviewed the patients History and Physical, chart, labs and discussed the procedure including the risks, benefits and alternatives for the proposed anesthesia with the patient or authorized representative who has indicated his/her understanding and acceptance.     Dental advisory given  Plan Discussed with:   Anesthesia Plan Comments:         Anesthesia Quick Evaluation

## 2018-09-21 NOTE — H&P (Signed)
Patient: Emma Lewis                                    Date of Birth: 12-01-1950                                                    MRN: [id number] Visit Date: 09/16/2018                                                                     Requested by: Philmore Pali, NP [address] [place],  [id number] PCP: Philmore Pali, NP   Assessment & Plan: Visit Diagnoses:  1. Pain in left hip   2. Periprosthetic fracture of femur at tip of prosthesis, subsequent encounter   3. Dislocation of left hip, initial encounter [facility])     Plan: Patient has complex problem.  I reviewed patient's x-rays with several my partners including Dr. Ninfa Linden and Dr. Marlou Sa.  Patient will require open reduction with revision of the femoral stem.  She may require fixation with cement fixation using the same stem.  Her stem is rotated and shortened and with a rotated stem close reduction alone will not be successful.  She has a 1.5 neck currently positioned and we do have several sizes that we could lengthen the neck.  We discussed that she possibly could have problems with the deep infection however her hip incision is healed nicely her nutrition is improved and she has had no fever or drainage.  Plan to be exploration with availability of constrained liner in case is needed.  Plan would be backing the stem up and likely cement fixation in appropriate version and appropriate height.  Risks of surgery discussed including possibility of femur fracture and attempt to get her hip reduced again.  Cable plate and all screws of held securely since her November surgery.  She understands request to proceed.  We will make sure that the restrained liners are available.  Patient is a 13.5 stem and we also could go up in size if needed however with her osteopenia and if an 8 inch fully coated stem subsided after several months of good stability it would be best to cement the stem to prevent subsidence and repeat dislocation. Plan was  discussed with patient and her husband.  They agree to proceed.  We discussed options with referring her to the [facility] and they would prefer to stay where they are.  Follow-Up Instructions: No follow-ups on file.   Orders:     Orders Placed This Encounter  Procedures  . XR HIP UNILAT W OR W/O PELVIS 2-3 VIEWS LEFT  . XR FEMUR MIN 2 VIEWS LEFT   No orders of the defined types were placed in this encounter.     Procedures: No procedures performed   Clinical Data: No additional findings.   Subjective:    Chief Complaint  Patient presents with  . Left Hip - Pain  . 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  Original right medical prosthesis 2011 done at [facility] by Dr. Eliezer Lofts.  She had problems with metallosis and had broken poly-cup which was revised with the [employer] acetabular shell by Dr. Lyla Glassing.  Patient had a ProFemur Wright medical stem with metallosis, trunionosis.  On 04/29/2018 she had implantation of a grips and acetabular shell, 8 inch AML fully coated stem +1.5 ball.  Femoral canal was reamed to 14 mm.  Long trochanteric osteotomy was performed for removal of the implant and 4 cables were placed.  Patient did well for 1 month then suffered a periprosthetic fracture and had repeat surgery on 06/01/2018 with long plate with trochanteric attachment proximally and fixation with multiple cables multiple screws and at the time of her fracture she had a dislocation of her hip.  After this procedure 3 weeks later on 06/19/2018 she developed drainage of her hip and had deep infection with cultures growing Proteus mirabilis which was sensitive to every antibiotic tested.  She was seen by infectious disease underwent washout procedure antibiotic bead placement with tobramycin vancomycin beads, PICC line placement repeat debridement VAC placement  and then at home on antibiotics doing physical therapy.  Few weeks ago she noticed increased pain in her hip.  Last x-rays were obtained on 08/04/2018 showed good position of the hip as well as plate cables and screws.  New x-rays obtained today shows 5 mm subsidence of the stem with rotation at least 40 degrees of the stem and a dislocated hip.  There is no change in the plate cables and femoral tube appears intact.  Patient mentioned that she started having some increased pain when she is using a rope something on her foot with a therapist while in the chair or recliner trying to stretch her hip up into flexion.  She does not recall any specific pop incident but did have increased pain for a week.  Currently patient is still on Cipro 750 mg 1 p.o. twice daily.  Hip incision is well-healed and she is not had any drainage in over a month.  Review of Systems positive for right total hip arthroplasty doing well.  Two-level cervical fusion 2015 doing well.  Osteoporosis.  History of L5 compression fracture.  Hypoalbuminemia, previous vertebroplasty L4.  Patient fractures L3 and L5 2016.  Metallosis.  Prediabetes.  Left total hip arthroplasty deep infection.  Periprosthetic fracture.  Otherwise negative as pertains HPI.   Objective: Vital Signs: BP (!) 142/73   Pulse 87   Ht 5\' 5"  (1.651 m)   Wt 195 lb (88.5 kg)   BMI 32.45 kg/m   Physical Exam Constitutional:      Appearance: She is well-developed.  HENT:     Head: Normocephalic.     Right Ear: External ear normal.     Left Ear: External ear normal.  Eyes:     Pupils: Pupils are equal, round, and reactive to light.  Neck:     Thyroid: No thyromegaly.     Trachea: No tracheal deviation.  Cardiovascular:     Rate and Rhythm: Normal rate.  Pulmonary:     Effort: Pulmonary effort is normal.  Abdominal:     Palpations: Abdomen is soft.  Skin:    General: Skin is warm and dry.  Neurological:     Mental Status: She is alert and oriented to  person, place, and time.  Psychiatric:  Behavior: Behavior normal.     Ortho Exam patient shortening left leg about an inch in the sitting position.  Distal pulses are intact sciatic function is intact.  She has mild discomfort with internal and external rotation of her hip.  Posterior hip incision is well-healed that extends all the way down to her knee.  Specialty Comments:  No specialty comments available.  Imaging: Xr Femur Min 2 Views Left  Result Date: 09/16/2018 Multiple views left femur obtained and reviewed.  This shows lateral plate fixation of periprosthetic femur fracture with total of 10 cables 10 screws in satisfactory position with intact femoral tube without displacement of fractures.  Femoral stem subsidence with rotation and dislocation of the left total hip. Impression: Left total hip arthroplasty dislocation with some subsidence measured 4 to 5 mm and rotation of the femoral stem.  Xr Hip Unilat W Or W/o Pelvis 2-3 Views Left  Result Date: 09/16/2018 AP pelvis x-rays obtained and reviewed.  This shows hip dislocation of the left hip with rotation of the femoral stem acetabulum is in good position with 2 screws present.  Right total hip arthroplasty shows good position alignment.  Multiple cables and screws are present in the left femur unchanged from previous x-ray. Impression: Left total hip arthroplasty dislocation with some subsidence and rotation of the femoral component.    PMFS History:     Patient Active Problem List   Diagnosis Date Noted  . Symptomatic anemia   . Ambulatory dysfunction 07/21/2018  . Weakness of both legs 07/21/2018  . Proteus mirabilis infection   . Hip hematoma, left 06/19/2018  . Hematoma of left hip 06/19/2018  . Hip dislocation, left ([place]) 05/29/2018  . Peri-prosthetic femur fracture at tip of prosthesis 05/29/2018  . Prediabetes   . Popping sound of knee joint   . Hypoalbuminemia due to protein-calorie  malnutrition ([place])   . Transaminitis   . Leukocytosis   . Postoperative pain   . Failure of left total hip arthroplasty ([place]) 05/01/2018  . Acute blood loss anemia   . Leukemoid reaction   . 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 Joint effusion of pelvis or thigh, left 04/22/2018  . Trochanteric bursitis, left hip 04/22/2018  . History of left hip replacement 11/12/2017  . Bilateral primary osteoarthritis of knee 11/12/2017  . GERD (gastroesophageal reflux disease) 01/17/2017  . Hypothyroidism 01/17/2017  . Vertigo 01/16/2017  . Nausea and vomiting 01/16/2017  . Essential hypertension 01/16/2017  . Fatty liver 01/16/2017  . Failed total hip arthroplasty ([place]) 06/10/2016  . Pelvic mass 06/15/2015  . Closed wedge compression fracture of fifth lumbar vertebra ([place])   . Cervical spondylosis 02/09/2014  . DDD (degenerative disc disease), lumbar 05/13/2013  . OA (osteoarthritis) 05/13/2013       Past Medical History:  Diagnosis Date  . Arthritis    "all over my body" (04/29/2018)  . Chronic lower back pain   . Fatty liver   . Fever blister    Takes Acyclovir  . GERD (gastroesophageal reflux disease)   . Hyperlipemia   . Hypothyroidism   . Migraines   . Osteopenia   . Pneumonia    "once; years ago" (04/29/2018)  . PONV (postoperative nausea and vomiting)   . Pre-diabetes   . Seasonal allergies   . Vertigo          Family History  Problem Relation Age of Onset  . Lymphoma Mother          Past Surgical History:  Procedure  Laterality Date  . ANTERIOR CERVICAL DECOMP/DISCECTOMY FUSION N/A 02/09/2014   Procedure: ANTERIOR CERVICAL DECOMPRESSION/DISCECTOMY FUSION 2 LEVELS;  Surgeon: [name], MD;  Location: [facility];  Service: Orthopedics;  Laterality: N/A;  C4-5, C5-6 Anterior Cervical Discectomy and Fusion, Allograft, Plate  . BACK SURGERY    . CARPAL TUNNEL RELEASE Right 2018  . CATARACT EXTRACTION W/ INTRAOCULAR LENS  IMPLANT, BILATERAL    .  COLONOSCOPY    . FIXATION KYPHOPLASTY LUMBAR SPINE  X 2  . HIP ARTHROPLASTY Left 2011  . INCISION AND DRAINAGE HIP Left 06/19/2018   Procedure: IRRIGATION AND DEBRIDEMENT LEFT HIP HEMATOMA ANTIBIOTIC BEADS, APPLICATION ON WOUND VAC;  Surgeon: [name], MD;  Location: [facility];  Service: Orthopedics;  Laterality: Left;  . JOINT REPLACEMENT    . KNEE ARTHROSCOPY Right   . ORIF PERIPROSTHETIC FRACTURE Left 06/01/2018   Procedure: OPEN REDUCTION INTERNAL FIXATION (ORIF) PERIPROSTHETIC FEMUR FRACTURE;  Surgeon: [name], MD;  Location: [facility];  Service: Orthopedics;  Laterality: Left;  . PATELLA FRACTURE SURGERY Right ~ 1990   "put metal in"  . PATELLA HARDWARE REMOVAL Right    "took the metal out"  . REVISION TOTAL HIP ARTHROPLASTY Left 04/29/2018  . ROBOTIC ASSISTED BILATERAL SALPINGO OOPHERECTOMY Bilateral 08/01/2015   Procedure: ROBOTIC ASSISTED BILATERAL SALPINGO OOPHORECTOMY;  Surgeon: Everitt Amber, MD;  Location: [facility];  Service: Gynecology;  Laterality: Bilateral;  . SHOULDER OPEN ROTATOR CUFF REPAIR Right    3 TOTAL  . SHOULDER SURGERY Right   . TOTAL HIP ARTHROPLASTY Right 01/21/2018   Procedure: RIGHT TOTAL HIP ARTHROPLASTY DIRECT ANTERIOR;  Surgeon: [name], MD;  Location: [facility];  Service: Orthopedics;  Laterality: Right;  . TOTAL HIP REVISION Left 06/10/2016   Procedure: TOTAL HIP REVISION ARTHROPLASTY;  Surgeon: Rod Can, MD;  Location: [facility];  Service: Orthopedics;  Laterality: Left;  . TOTAL HIP REVISION Left 04/29/2018   Procedure: left total hip arthroplasty revision posterior approach;  Surgeon: [name], MD;  Location: [facility];  Service: Orthopedics;  Laterality: Left;  . WRIST GANGLION EXCISION Left 1970s   Social History        Occupational History  . Not on file  Tobacco Use  . Smoking status: Never Smoker  . Smokeless tobacco: Never Used  Substance and Sexual Activity  . Alcohol use: Yes    Comment: 04/29/2018 "maybe 2  drinks/year"  . Drug use: Never  . Sexual activity: Not Currently

## 2018-09-21 NOTE — Interval H&P Note (Signed)
History and Physical Interval Note:  09/21/2018 12:18 PM  Emma Lewis  has presented today for surgery, with the diagnosis of left total hip arthroplasty dislocation  The various methods of treatment have been discussed with the patient and family. After consideration of risks, benefits and other options for treatment, the patient has consented to  Procedure(s): left total hip arthroplasty femoral revision (Left) as a surgical intervention .  The patient's history has been reviewed, patient examined, no change in status, stable for surgery.  I have reviewed the patient's chart and labs.  Questions were answered to the patient's satisfaction.     [name]

## 2018-09-21 NOTE — Plan of Care (Signed)

## 2018-09-21 NOTE — Anesthesia Postprocedure Evaluation (Signed)
Anesthesia Post Note  Patient: Emma Lewis  Procedure(s) Performed: left total hip arthroplasty femoral revision (Left Hip)     Patient location during evaluation: PACU Anesthesia Type: General Level of consciousness: awake and alert Pain management: pain level controlled Vital Signs Assessment: post-procedure vital signs reviewed and stable Respiratory status: spontaneous breathing, nonlabored ventilation and respiratory function stable Cardiovascular status: blood pressure returned to baseline and stable Postop Assessment: no apparent nausea or vomiting Anesthetic complications: no    Last Vitals:  Vitals:   09/21/18 1615 09/21/18 1635  BP: 118/60 112/79  Pulse:  84  Resp:  12  Temp: 36.7 C   SpO2:  96%    Last Pain:  Vitals:   09/21/18 1035  TempSrc:   PainSc: 0-No pain                 Lidia Collum

## 2018-09-21 NOTE — Op Note (Signed)
Preop diagnosis: Left total hip arthroplasty with femoral stem subsidence with hip dislocation.  Postop diagnosis: Same  Procedure: Left total hip arthroplasty revision of femoral stem.  Reimplantation with vancomycin impregnated cement  Surgeon: Rodell Perna, MD  Assistant: Larkin Ina RNFA  Anesthesia: General  EBL: 200 mL's  Explants: +1.5 ball removed.  8 inch straight fully coated AML stem removed cleaned and reimplanted with cement.  +5 mm ball, metal.  Procedure: After induction general anesthesia patient was placed in lateral position C-arm was brought in.  Spot film was taken after pulling on the leg and hip was still dislocated.  Prepping with ChloraPrep preoperative Ancef prophylaxis 2 g and timeout procedure completed 1015 drape of been applied and usual split sheets drapes sterile impervious stockinette Covan sterile skin marker and the old incision and Betadine Steri-Drape x2 application.  Posterior approach was opened taking the upper half of the incision for standard posterior total hip arthroplasty length incision.  Patient previously had had a longstem plate that extended almost down to the knee with total of 10 screws and 10 cables placed.  The femoral stem had rotated subsided compared to immediate postop x-rays.  Hip was opened up and immediately some clear slightly cloudy fluid was noted no purulence was seen and cultures were obtained aerobic and anaerobic.  There was some increased clear serous fluid no necrosis was noted.  Cut pieces of the proximal bone was loose with no obvious osteomyelitis and a piece of the cortical bone adjacent to the proximal end was mobile and was removed and discarded.  The stem was grossly loose and after removing some bone proximally on the undersurface of the trochanter to make sure when the stem was removed it would not band of the [place]u trochanter and great break the greater trochanter off stem was completely removed.  There was fibrous ingrowth  around the prosthesis and fibrous tissue was scraped out of the inside of the canal.  Mild brush pulsatile lavage was used followed by some trial stem with -3 neck length ball.  With the proximal portion of the stem where it was flat where the normal inserter would be placed was flush with the top of the plate on the greater trochanter this would leave the center of the ball even with the top tip of the trochanter and a 13.5 trial was inserted hip was reduced and stability was checked.  There was still some impingement anteriorly with large amount of fibrous tissue.  There is some tissue that still had some metallosis present some the serous was debrided.  Portion of the acetabulum had filled in with some scar tissue and the granulation tissue was removed as well as some bone anteriorly off of the pelvis and also the anterior aspect of the greater trochanter which was causing some impingement with flexion and internal rotation.  Once were happy with stability with the +5 ball neck length which was very difficult to reduce cement was vacuum mixed after bottle brushing again placing dry sponges vacuum mixing the cement which was mixed with 1 g of vancomycin powder the glue gun was used after #4 cement restrictor was placed appropriate length which gave it about 1 cm from the tip of the 80 and stem for the planned position.  Looking under C arm which is sterilely draped and brought in this left the tip of the stem even with the second most distal cable.  Previously to have subsided down to the distalmost cable which was a loss  of almost an inch.  As the cement hardened the prosthesis was inserted with appropriate 20degree anteversion.  Again once cement was hardened 15 minutes trials were tried and then a permanent +5 ball was selected and then some additional removal of some bone anteriorly off the trochanter so some of the V-shaped healing of the scar from multiple previous procedures.  Would not impinge in some  additional removal of anterior scar tissue which was thick and fibrotic capsule related to the patient's longstanding metallosis from the previous prosthesis the patient had placed that had been recalled.  Good stability flexion and 90 internal rotation 45 degrees with good stability.  There was no shuck.  Quad is not tight and he could be flexed to 90 degrees.  Pulse lavage.  Once I had taken the stem out for we cemented the stem I took a piece of the capsule inside the canal soft tissue and sent this for pathologic exam as well as 1 deep culture was taken with some tissue down around the stem and deep at the hip joint.  Standard tensor fascia closure with #1 Vicryl 2-0 Vicryl subtenons tissue skin staple closure after undermining the skin some due to the patient's previous multiple procedures.  Skin staple closure postop dressing knee immobilizer and transfer the care of room where leg lengths were equal sciatic function was intact.  X-rays taken in the recovery room AP lateral of the femur including through the groin lateral showed some cement had leaked out anteriorly with pressurization 2 small areas Angiulli on the cortex.  Cement restrictor was in good position and stem was centered down the middle of the canal with good cement mantle.  The tip of the greater trochanter was even with the middle of the ball of the +5 metal ball that been placed and on AP pelvis x-ray a line drawn to the lesser trochanter through the ischial tuberosity corresponded to the opposite total hip arthroplasty with restoration of leg length.  Sciatic function was intact patient tolerated the procedure well and was stable in the recovery room.

## 2018-09-21 NOTE — Progress Notes (Signed)
Orthopedic Tech Progress Note Patient Details:  Emma Lewis Oct 14, 1950 [id number] Del over bed frame to patient     Post Interventions Patient Tolerated: Well Instructions Provided: Adjustment of device, Care of device   [name] [name] 09/21/2018, 6:14 PM

## 2018-09-21 NOTE — Plan of Care (Signed)
  Problem: Pain Managment: Goal: General experience of comfort will improve Outcome: Progressing   Problem: Safety: Goal: Ability to remain free from injury will improve Outcome: Progressing   

## 2018-09-21 NOTE — Anesthesia Procedure Notes (Signed)
Procedure Name: Intubation Date/Time: 09/21/2018 1:28 PM Performed by: Eligha Bridegroom, CRNA Pre-anesthesia Checklist: Patient identified, Emergency Drugs available, Suction available, Patient being monitored and Timeout performed Patient Re-evaluated:Patient Re-evaluated prior to induction Oxygen Delivery Method: Circle system utilized Preoxygenation: Pre-oxygenation with 100% oxygen Induction Type: IV induction Ventilation: Mask ventilation without difficulty Laryngoscope Size: Mac and 4 Grade View: Grade II Tube type: Oral Tube size: 7.0 mm Number of attempts: 2 Airway Equipment and Method: Stylet Placement Confirmation: ETT inserted through vocal cords under direct vision,  positive ETCO2 and breath sounds checked- equal and bilateral Secured at: 21 cm Tube secured with: Tape Dental Injury: Teeth and Oropharynx as per pre-operative assessment

## 2018-09-21 NOTE — Transfer of Care (Signed)
Immediate Anesthesia Transfer of Care Note  Patient: Emma Lewis  Procedure(s) Performed: left total hip arthroplasty femoral revision (Left Hip)  Patient Location: PACU  Anesthesia Type:General  Level of Consciousness: awake, alert  and oriented  Airway & Oxygen Therapy: Patient Spontanous Breathing and Patient connected to nasal cannula oxygen  Post-op Assessment: Report given to RN and Post -op Vital signs reviewed and stable  Post vital signs: Reviewed and stable  Last Vitals:  Vitals Value Taken Time  BP    Temp    Pulse 90 09/21/2018  4:12 PM  Resp 17 09/21/2018  4:12 PM  SpO2 92 % 09/21/2018  4:12 PM  Vitals shown include unvalidated device data.  Last Pain:  Vitals:   09/21/18 1035  TempSrc:   PainSc: 0-No pain      Patients Stated Pain Goal: 4 (76/28/31 5176)  Complications: No apparent anesthesia complications

## 2018-09-21 NOTE — Progress Notes (Signed)
PT Cancellation Note  Patient Details Name: PAMILA MENDIBLES MRN: [id number] DOB: 1950/10/10   Cancelled Treatment:    Reason Eval/Treat Not Completed: Other (comment) Pt with no weightbearing orders in chart. Will await weightbearing orders prior to initiating therapy and follow up as schedule allows.   Leighton Ruff, PT, DPT  Acute Rehabilitation Services  Pager: [phone] Office: [phone]    Rudean Hitt 09/21/2018, 5:49 PM

## 2018-09-22 ENCOUNTER — Encounter (HOSPITAL_COMMUNITY): Payer: Self-pay | Admitting: Orthopaedic Surgery

## 2018-09-22 LAB — CBC
HCT: 31.4 % — ABNORMAL LOW (ref 36.0–46.0)
Hemoglobin: 9.7 g/dL — ABNORMAL LOW (ref 12.0–15.0)
MCH: 27.8 pg (ref 26.0–34.0)
MCHC: 30.9 g/dL (ref 30.0–36.0)
MCV: 90 fL (ref 80.0–100.0)
Platelets: 206 10*3/uL (ref 150–400)
RBC: 3.49 MIL/uL — ABNORMAL LOW (ref 3.87–5.11)
RDW: 22.4 % — ABNORMAL HIGH (ref 11.5–15.5)
WBC: 6.9 10*3/uL (ref 4.0–10.5)
nRBC: 0 % (ref 0.0–0.2)

## 2018-09-22 MED ORDER — CALCIUM CARBONATE-VITAMIN D 500-200 MG-UNIT PO TABS
1.0000 | ORAL_TABLET | Freq: Every day | ORAL | Status: DC
  Administered 2018-09-23: 1 via ORAL
  Filled 2018-09-22 (×2): qty 1

## 2018-09-22 NOTE — Evaluation (Signed)
Physical Therapy Evaluation Patient Details Name: Emma Lewis MRN: [id number] DOB: 09/11/1950 Today's Date: 09/22/2018   History of Present Illness  Pt is a 68 yo female s/p L THA revision due to dislocation. PMH L femur fx with ORIF 05/2018, L THA revision 04/2018, admission with BLE weakness and fatigue in 06/2018. PMH vertigo, osteopenia, chronic low back pain, migraines, arthritis.   Clinical Impression   Pt received in bed, willing to participate in therapy. Education provided on posterior hip precautions-pt able to repeat them back but does not demonstrate understanding and will need a lot of enforcement. Bed mobility with minA for LE management, minA for slight balance correction during sit to stand, ambulation with min guard. Multiple instances of breaking precautions throughout session- she reached down to scratch leg while in sitting and she had difficulty with keeping L foot from turning inward during turns while walking. Education provided that these movements could cause another dislocation. Plan to progress her functional mobility and reinforce hip precautions in next session. At this time, pt is moving well and main limiting factor is safety, as she is struggling to maintain her precautions. She will continue to benefit from skilled acute PT services to safely progress functional mobility and ensure safe DC.    Follow Up Recommendations Follow surgeon's recommendation for DC plan and follow-up therapies    Equipment Recommendations  Other (comment)(pt is very well-equipped)    Recommendations for Other Services OT consult     Precautions / Restrictions Precautions Precautions: Fall;Posterior Hip Precaution Booklet Issued: No Precaution Comments: Precautions reviewed verbally with pt. Pt able to repeat back but having difficulty following them. Needs a lot of reinforcement Required Braces or Orthoses: Knee Immobilizer - Left Knee Immobilizer - Left: Other (comment)(on in  bed) Restrictions Weight Bearing Restrictions: Yes LLE Weight Bearing: Weight bearing as tolerated      Mobility  Bed Mobility Overal bed mobility: Needs Assistance Bed Mobility: Supine to Sit     Supine to sit: Min assist;HOB elevated     General bed mobility comments: MinA for management of L LE, heavy v/c for avoiding L hip IR during bed mobility, pt did not require physical assistance to elevate trunk  Transfers Overall transfer level: Needs assistance Equipment used: Rolling walker (2 wheeled) Transfers: Sit to/from Stand Sit to Stand: Min assist         General transfer comment: Pt able to stand from bed, minA at top of stand to steady balance, good hand placement  Ambulation/Gait Ambulation/Gait assistance: Min guard Gait Distance (Feet): 50 Feet Assistive device: Rolling walker (2 wheeled) Gait Pattern/deviations: Step-through pattern;Decreased step length - right;Decreased stance time - left;Antalgic Gait velocity: decreased   General Gait Details: Pt overall struggling to maintain posterior hip precautions during ambulation. Difficulty around turns with keeping L foot from turning inwards. Heavy v/c to turn foot with body, still struggling. Heavy dependence on RW for support  Stairs            Wheelchair Mobility    Modified Rankin (Stroke Patients Only)       Balance Overall balance assessment: Needs assistance Sitting-balance support: Bilateral upper extremity supported;Feet supported Sitting balance-Leahy Scale: Fair     Standing balance support: Bilateral upper extremity supported;During functional activity Standing balance-Leahy Scale: Poor Standing balance comment: Reliant on RW for balance during ambulation; one LOB during sit to stand with minA to correct  Pertinent Vitals/Pain Pain Assessment: Faces Faces Pain Scale: Hurts a little bit Pain Location: L hip Pain Descriptors / Indicators:  Discomfort;Guarding Pain Intervention(s): Limited activity within patient's tolerance;Monitored during session;Repositioned    Home Living Family/patient expects to be discharged to:: Private residence Living Arrangements: Spouse/significant other Available Help at Discharge: Family;Available 24 hours/day Type of Home: House Home Access: Ramped entrance     Home Layout: One level Home Equipment: Walker - 2 wheels;Cane - single point;Bedside commode;Crutches;Shower seat - built in;Wheelchair - manual      Prior Function Level of Independence: Independent with assistive device(s)         Comments: Pt states she has been using a RW for ambulation since her original surgery in Oct 2019. Before her prior hospitalization (06/2018) she had a period of w/c use.     Hand Dominance   Dominant Hand: Right    Extremity/Trunk Assessment   Upper Extremity Assessment Upper Extremity Assessment: Overall WFL for tasks assessed    Lower Extremity Assessment Lower Extremity Assessment: LLE deficits/detail LLE Deficits / Details: Pt tends to IR L hip, required v/c and assistance with bed mobility to maintain post precautions, v/c for preventing L LE IR during ambulation    Cervical / Trunk Assessment Cervical / Trunk Assessment: Normal  Communication   Communication: No difficulties  Cognition Arousal/Alertness: Awake/alert Behavior During Therapy: Impulsive;WFL for tasks assessed/performed Overall Cognitive Status: Within Functional Limits for tasks assessed                                 General Comments: Pt somewhat impulsive- does not follow precautions well despite verbalizing them.      General Comments General comments (skin integrity, edema, etc.): Noted that SpO2 was ranging 88-93% on monitor. Monitored pt's subjective signs due to possible poor signal due to nail polish. RN notified.    Exercises     Assessment/Plan    PT Assessment Patient needs  continued PT services  PT Problem List Decreased strength;Decreased balance;Decreased knowledge of precautions;Pain;Decreased range of motion;Decreased mobility;Decreased knowledge of use of DME;Decreased activity tolerance;Decreased coordination;Decreased safety awareness       PT Treatment Interventions DME instruction;Functional mobility training;Balance training;Patient/family education;Gait training;Therapeutic activities;Neuromuscular re-education;Therapeutic exercise;Cognitive remediation;Stair training    PT Goals (Current goals can be found in the Care Plan section)  Acute Rehab PT Goals Patient Stated Goal: go home with hip finally feeling better PT Goal Formulation: With patient Time For Goal Achievement: 10/06/18 Potential to Achieve Goals: Good    Frequency 7X/week   Barriers to discharge        Co-evaluation               AM-PAC PT "6 Clicks" Mobility  Outcome Measure Help needed turning from your back to your side while in a flat bed without using bedrails?: A Little Help needed moving from lying on your back to sitting on the side of a flat bed without using bedrails?: A Little Help needed moving to and from a bed to a chair (including a wheelchair)?: A Little Help needed standing up from a chair using your arms (e.g., wheelchair or bedside chair)?: A Little Help needed to walk in hospital room?: A Little Help needed climbing 3-5 steps with a railing? : A Lot 6 Click Score: 17    End of Session Equipment Utilized During Treatment: Gait belt Activity Tolerance: Patient tolerated treatment well;Patient limited by fatigue Patient left: in  chair;with chair alarm set;with call bell/phone within reach Nurse Communication: Precautions;Weight bearing status PT Visit Diagnosis: Unsteadiness on feet (R26.81);Other abnormalities of gait and mobility (R26.89);Pain Pain - Right/Left: Left Pain - part of body: Hip    Time: 6659-9357 PT Time Calculation (min) (ACUTE  ONLY): 46 min   Charges:   PT Evaluation $PT Eval Moderate Complexity: 1 Mod PT Treatments $Gait Training: 8-22 mins $Therapeutic Activity: 8-22 mins       Ronnell Guadalajara, SPT   Ronnell Guadalajara 09/22/2018, 1:22 PM

## 2018-09-22 NOTE — Plan of Care (Signed)

## 2018-09-22 NOTE — Progress Notes (Signed)
   Subjective: 1 Day Post-Op Procedure(s) (LRB): left total hip arthroplasty femoral revision (Left) Patient reports pain as moderate.    Objective: Vital signs in last 24 hours: Temp:  [97.4 F (36.3 C)-98.1 F (36.7 C)] 98 F (36.7 C) (02/25 0621) Pulse Rate:  [75-93] 75 (02/25 0621) Resp:  [12-20] 16 (02/25 0621) BP: (90-141)/(55-79) 90/78 (02/25 0621) SpO2:  [92 %-98 %] 97 % (02/25 0621) Weight:  [86.2 kg] 86.2 kg (02/24 1013)  Intake/Output from previous day: 02/24 0701 - 02/25 0700 In: 2350 [I.V.:1900; IV Piggyback:450] Out: 400 [Urine:100; Blood:300] Intake/Output this shift: No intake/output data recorded.  Recent Labs    09/21/18 1025 09/22/18 0224  HGB 13.5 9.7*   Recent Labs    09/21/18 1025 09/22/18 0224  WBC 7.5 6.9  RBC 4.74 3.49*  HCT 42.4 31.4*  PLT 274 206   Recent Labs    09/21/18 1025  NA 138  K 3.7  CL 106  CO2 25  BUN 14  CREATININE 0.70  GLUCOSE 95  CALCIUM 10.1   No results for input(s): LABPT, INR in the last 72 hours.  Neurologically intact Dg Pelvis Portable  Result Date: 09/21/2018 CLINICAL DATA:  Postop EXAM: PORTABLE PELVIS 1-2 VIEWS COMPARISON:  09/16/2018 FINDINGS: Interval reduction of the previously seen dislocated left hip replacement. Bilateral hip replacements are noted with normal AP alignment. No visible hardware or bony complicating feature. IMPRESSION: Interval reduction of the dislocated left hip replacement. Normal AP alignment. Electronically Signed   By: Rolm Baptise M.D.   On: 09/21/2018 18:40   Dg C-arm 1-60 Min  Result Date: 09/21/2018 CLINICAL DATA:  Left hip arthroplasty revision EXAM: OPERATIVE LEFT HIP WITH PELVIS; DG C-ARM 61-120 MIN COMPARISON:  09/16/2018 FLUOROSCOPY TIME:  Fluoroscopy Time:  12 seconds Radiation Exposure Index (if provided by the fluoroscopic device): Not available Number of Acquired Spot Images: 3 FINDINGS: Previously seen dislocation has been reduced. Fixation sideplate and multiple  fixation screws are again identified. IMPRESSION: Left hip prosthesis in satisfactory position. Electronically Signed   By: Inez Catalina M.D.   On: 09/21/2018 15:59   Dg Hip Operative Unilat W Or W/o Pelvis Left  Result Date: 09/21/2018 CLINICAL DATA:  Left hip arthroplasty revision EXAM: OPERATIVE LEFT HIP WITH PELVIS; DG C-ARM 61-120 MIN COMPARISON:  09/16/2018 FLUOROSCOPY TIME:  Fluoroscopy Time:  12 seconds Radiation Exposure Index (if provided by the fluoroscopic device): Not available Number of Acquired Spot Images: 3 FINDINGS: Previously seen dislocation has been reduced. Fixation sideplate and multiple fixation screws are again identified. IMPRESSION: Left hip prosthesis in satisfactory position. Electronically Signed   By: Inez Catalina M.D.   On: 09/21/2018 15:59   Dg Femur Min 2 Views Left  Result Date: 09/21/2018 CLINICAL DATA:  Postop EXAM: LEFT FEMUR 2 VIEWS COMPARISON:  09/16/2018 FINDINGS: Lateral plate and screw fixation device and left hip replacement hardware noted within the left femur. No hardware complicating feature. No fracture, subluxation or dislocation. IMPRESSION: No acute bony abnormality. Electronically Signed   By: Rolm Baptise M.D.   On: 09/21/2018 18:41    Assessment/Plan: 1 Day Post-Op Procedure(s) (LRB): left total hip arthroplasty femoral revision (Left) Up with therapy, posterior hip precautions. Dressing change. WBAT left LE.   Emma Lewis 09/22/2018, 7:47 AM

## 2018-09-22 NOTE — Progress Notes (Signed)
Physical Therapy Treatment Patient Details Name: Emma Lewis MRN: [id number] DOB: [birth year] Today's Date: 09/22/2018    History of Present Illness Pt is a 68 yo female s/p L THA revision due to dislocation. PMH L femur fx with ORIF 05/2018, L THA revision 04/2018, admission with BLE weakness and fatigue in 06/2018. PMH vertigo, osteopenia, chronic low back pain, migraines, arthritis.    PT Comments    Pt received up in chair, willing to participate in therapy. Pain 5/10. Reviewed post hip precautions, demonstrated turn to the left, emphasizing that IR of hip during turn can result in another dislocation. Pt better able to follow precautions during this session; still needs a lot of reinforcement. Adjusted RW to better fit pt, she reports this decreased her UE fatigue. Performed exercises within pt's pain tolerance. Left pt in bed with bed alarm active, needs met, call bell in reach. She will continue to benefit from skilled acute PT services to improve her safety and functional mobility to ensure safe DC.    Follow Up Recommendations  Follow surgeon's recommendation for DC plan and follow-up therapies     Equipment Recommendations  Other (comment)(pt has all necessary equipment)    Recommendations for Other Services       Precautions / Restrictions Precautions Precautions: Fall;Posterior Hip Precaution Comments: Precautions reviewed verbally with pt. Pt able to repeat back but having difficulty following them without v/c. Needs a lot of reinforcement Required Braces or Orthoses: Knee Immobilizer - Left Knee Immobilizer - Left: Other (comment)(on in bed) Restrictions Weight Bearing Restrictions: Yes LLE Weight Bearing: Weight bearing as tolerated    Mobility  Bed Mobility Overal bed mobility: Needs Assistance Bed Mobility: Sit to Supine       Sit to supine: Min assist   General bed mobility comments: MinA for guarding of L LE to ensure no IR, otherwise pt able to get into  bed without physical assist  Transfers Overall transfer level: Needs assistance Equipment used: Rolling walker (2 wheeled) Transfers: Sit to/from Stand Sit to Stand: Min guard         General transfer comment: Sit to stand from chair and sit to bed much safer than at eval; steady, no LOB. min guard for safety  Ambulation/Gait Ambulation/Gait assistance: Min guard Gait Distance (Feet): 50 Feet Assistive device: Rolling walker (2 wheeled) Gait Pattern/deviations: Step-through pattern;Decreased step length - right;Decreased stance time - left;Antalgic Gait velocity: decreased   General Gait Details: Pt still struggling to maintain post hip precautions at beginning of walk- heavy v/c for turns to the L side to avoid L foot turning in. Improvement with repetition and decreased cuing. She was able to safely complete a tight left turn at the bed with only cue to "do it safely"   Stairs             Wheelchair Mobility    Modified Rankin (Stroke Patients Only)       Balance   Sitting-balance support: Bilateral upper extremity supported;Feet supported Sitting balance-Leahy Scale: Fair     Standing balance support: Bilateral upper extremity supported;During functional activity Standing balance-Leahy Scale: Fair                              Cognition Arousal/Alertness: Awake/alert Behavior During Therapy: Impulsive;WFL for tasks assessed/performed Overall Cognitive Status: Within Functional Limits for tasks assessed  General Comments: Pt somewhat impulsive- following precautions slightly better than at eval but still requring v/c      Exercises Total Joint Exercises Ankle Circles/Pumps: AROM;Both;10 reps Quad Sets: AROM;Left;10 reps Short Arc Quad: AROM;Left;10 reps Hip ABduction/ADduction: AAROM;Left(attempted 1 rep- stopped due to sharp pain in hip)    General Comments General comments (skin integrity,  edema, etc.): Pt on 2 L/min O2      Pertinent Vitals/Pain Pain Assessment: 0-10 Pain Score: 5  Pain Location: L hip Pain Descriptors / Indicators: Discomfort;Guarding;Sore Pain Intervention(s): Monitored during session;Repositioned;Limited activity within patient's tolerance    Home Living                      Prior Function            PT Goals (current goals can now be found in the care plan section) Acute Rehab PT Goals Patient Stated Goal: go home with hip finally feeling better PT Goal Formulation: With patient Time For Goal Achievement: 10/06/18 Potential to Achieve Goals: Good Progress towards PT goals: Progressing toward goals    Frequency    7X/week      PT Plan Current plan remains appropriate    Co-evaluation              AM-PAC PT "6 Clicks" Mobility   Outcome Measure  Help needed turning from your back to your side while in a flat bed without using bedrails?: A Little Help needed moving from lying on your back to sitting on the side of a flat bed without using bedrails?: A Little Help needed moving to and from a bed to a chair (including a wheelchair)?: A Little Help needed standing up from a chair using your arms (e.g., wheelchair or bedside chair)?: A Little Help needed to walk in hospital room?: A Little Help needed climbing 3-5 steps with a railing? : A Lot 6 Click Score: 17    End of Session Equipment Utilized During Treatment: Gait belt Activity Tolerance: Patient tolerated treatment well Patient left: in bed;with bed alarm set;with call bell/phone within reach   PT Visit Diagnosis: Unsteadiness on feet (R26.81);Other abnormalities of gait and mobility (R26.89);Pain Pain - Right/Left: Left Pain - part of body: Hip     Time: 3419-6222 PT Time Calculation (min) (ACUTE ONLY): 35 min  Charges:  $Gait Training: 8-22 mins $Therapeutic Exercise: 8-22 mins                     Ronnell Guadalajara, SPT    Ronnell Guadalajara 09/22/2018,  3:19 PM

## 2018-09-22 NOTE — Care Management (Signed)
Pt is a 68 yo female s/p L THA revision due to dislocation. Case manager spoke with patient concerning discharge  And DME needs. Patient will not have home health, and has RW, 3in1 and wheelchair at home, will have support from her husband at discharge.

## 2018-09-23 ENCOUNTER — Encounter (HOSPITAL_COMMUNITY): Payer: Self-pay | Admitting: Orthopaedic Surgery

## 2018-09-23 LAB — CBC
HCT: 30.3 % — ABNORMAL LOW (ref 36.0–46.0)
Hemoglobin: 9.3 g/dL — ABNORMAL LOW (ref 12.0–15.0)
MCH: 27.7 pg (ref 26.0–34.0)
MCHC: 30.7 g/dL (ref 30.0–36.0)
MCV: 90.2 fL (ref 80.0–100.0)
Platelets: 185 10*3/uL (ref 150–400)
RBC: 3.36 MIL/uL — ABNORMAL LOW (ref 3.87–5.11)
RDW: 22 % — ABNORMAL HIGH (ref 11.5–15.5)
WBC: 7 10*3/uL (ref 4.0–10.5)
nRBC: 0 % (ref 0.0–0.2)

## 2018-09-23 LAB — AEROBIC CULTURE W GRAM STAIN (SUPERFICIAL SPECIMEN): Culture: NO GROWTH

## 2018-09-23 LAB — AEROBIC CULTURE  (SUPERFICIAL SPECIMEN)

## 2018-09-23 MED ORDER — HYDROCODONE-ACETAMINOPHEN 7.5-325 MG PO TABS: 1.0000 | ORAL_TABLET | ORAL | 0 refills | Status: DC | PRN

## 2018-09-23 NOTE — Progress Notes (Signed)
Physical Therapy Treatment Patient Details Name: Emma Lewis MRN: [id number] DOB: [birth year] Today's Date: 09/23/2018    History of Present Illness Pt is a 68 yo female s/p L THA revision due to dislocation. PMH L femur fx with ORIF 05/2018, L THA revision 04/2018, admission with BLE weakness and fatigue in 06/2018. PMH vertigo, osteopenia, chronic low back pain, migraines, arthritis.    PT Comments    Pt received in bed, willing to participate in PT. She verbalizes posterior hip precautions. Completed exercises. AAROM for hip abduction due to increased soreness and stiffness with movement. Bed mobility improving; pt reports she sleeps in a recliner at baseline due to back pain. Still considerable concern with maintaining precautions. She does not seem receptive to the issue with turning her foot inwards when she walks, despite repeated education about the possibility of dislocation and repeated practice of left turns. Plan to see pt again today to try to reinforce precautions again. She will continue to benefit from skilled acute PT services to improve her safety.     Follow Up Recommendations  Follow surgeon's recommendation for DC plan and follow-up therapies     Equipment Recommendations  Other (comment)(pt has necessary equipment at home)    Recommendations for Other Services       Precautions / Restrictions Precautions Precautions: Fall;Posterior Hip Precaution Booklet Issued: Yes (comment) Precaution Comments: Precautions reviewed verbally with pt. Pt able to repeat back but having difficulty following them without v/c. Needs a lot of reinforcement Required Braces or Orthoses: Knee Immobilizer - Left Knee Immobilizer - Left: Other (comment)(in bed) Restrictions Weight Bearing Restrictions: Yes LLE Weight Bearing: Weight bearing as tolerated    Mobility  Bed Mobility Overal bed mobility: Needs Assistance Bed Mobility: Sit to Supine     Supine to sit: Min guard;HOB  elevated     General bed mobility comments: Pt able to safely get out of bed with HOB elevated without breaking post hip precautions. min guard of L LE for safety. Tactile cues given to prevent hip flexion past 90 deg when scooting to EOB.  Transfers Overall transfer level: Needs assistance Equipment used: Rolling walker (2 wheeled) Transfers: Sit to/from Stand Sit to Stand: Min guard         General transfer comment: Still watching pt closely during all movements to ensure she does not break her precautions. She does extend L LE in front of her to help prevent hip flexion during sit to/from stand transfers  Ambulation/Gait Ambulation/Gait assistance: Min guard Gait Distance (Feet): 65 Feet Assistive device: Rolling walker (2 wheeled) Gait Pattern/deviations: Step-through pattern;Decreased step length - right;Decreased stance time - left;Antalgic Gait velocity: decreased   General Gait Details: Still having difficulty avoiding IR of L LE during turns to the left. Emphasized practice of these during walk today and pt does not demonstrate consistency with preventing foot from turning inwards during turns. May consider recommending pt turn to the right when possible to avoid breaking precautions.   Stairs             Wheelchair Mobility    Modified Rankin (Stroke Patients Only)       Balance Overall balance assessment: Needs assistance Sitting-balance support: Feet supported;Single extremity supported Sitting balance-Leahy Scale: Good     Standing balance support: Bilateral upper extremity supported;During functional activity Standing balance-Leahy Scale: Fair Standing balance comment: Reliant on RW for balance and to offload L LE due to pain  Cognition Arousal/Alertness: Awake/alert Behavior During Therapy: Impulsive;WFL for tasks assessed/performed Overall Cognitive Status: Within Functional Limits for tasks assessed                                  General Comments: Pt somewhat impulsive- following precautions slightly better than at eval but still requring v/c      Exercises Total Joint Exercises Ankle Circles/Pumps: AROM;Both;10 reps Quad Sets: AROM;Left;10 reps Short Arc Quad: AROM;Left;10 reps Hip ABduction/ADduction: AAROM;Left;10 reps    General Comments        Pertinent Vitals/Pain Pain Assessment: Faces Faces Pain Scale: Hurts little more Pain Location: L hip Pain Descriptors / Indicators: Discomfort;Guarding;Sore Pain Intervention(s): Monitored during session;Limited activity within patient's tolerance;Repositioned    Home Living                      Prior Function            PT Goals (current goals can now be found in the care plan section) Acute Rehab PT Goals Patient Stated Goal: go home with hip finally feeling better PT Goal Formulation: With patient Time For Goal Achievement: 10/06/18 Potential to Achieve Goals: Good Progress towards PT goals: Progressing toward goals    Frequency    7X/week      PT Plan Current plan remains appropriate    Co-evaluation              AM-PAC PT "6 Clicks" Mobility   Outcome Measure  Help needed turning from your back to your side while in a flat bed without using bedrails?: A Little Help needed moving from lying on your back to sitting on the side of a flat bed without using bedrails?: A Little Help needed moving to and from a bed to a chair (including a wheelchair)?: A Little Help needed standing up from a chair using your arms (e.g., wheelchair or bedside chair)?: A Little Help needed to walk in hospital room?: A Little Help needed climbing 3-5 steps with a railing? : A Lot 6 Click Score: 17    End of Session Equipment Utilized During Treatment: Gait belt Activity Tolerance: Patient tolerated treatment well Patient left: in bed;with bed alarm set;with call bell/phone within reach   PT Visit  Diagnosis: Unsteadiness on feet (R26.81);Other abnormalities of gait and mobility (R26.89);Pain Pain - Right/Left: Left Pain - part of body: Hip     Time: 0902-0932 PT Time Calculation (min) (ACUTE ONLY): 30 min  Charges:  $Gait Training: 8-22 mins $Therapeutic Exercise: 8-22 mins                     Ronnell Guadalajara, SPT    Ronnell Guadalajara 09/23/2018, 10:31 AM

## 2018-09-23 NOTE — Progress Notes (Signed)
Discharge instructions given to pt and pt's husband. Pt not in distress and tolerated well.

## 2018-09-23 NOTE — Progress Notes (Signed)
Physical Therapy Treatment Patient Details Name: Emma Lewis MRN: [id number] DOB: 02/07/1951 Today's Date: 09/23/2018    History of Present Illness Pt is a 68 yo female s/p L THA revision due to dislocation. PMH L femur fx with ORIF 05/2018, L THA revision 04/2018, admission with BLE weakness and fatigue in 06/2018. PMH vertigo, osteopenia, chronic low back pain, migraines, arthritis.    PT Comments    Pt received up in room with nurse tech going to bathroom, willing to participate in therapy. Upon standing, noted drainage from bandage on pad in chair, RN reinforced bandage while pt in standing. Pt's safety awareness has improved since previous session; she was able to demonstrate safe turns to the left, avoiding IR of the L LE. Education provided on safe bed mobility techniques. She will continue to benefit from reinforcement and reminders of post hip precautions to prevent future dislocation. At this point, from a PT standpoint, pt is safe to DC home with caregiver.    Follow Up Recommendations  Follow surgeon's recommendation for DC plan and follow-up therapies     Equipment Recommendations  Other (comment)(pt has all equipment at home)    Recommendations for Other Services       Precautions / Restrictions Precautions Precautions: Fall;Posterior Hip Precaution Booklet Issued: Yes (comment) Precaution Comments: Precautions reviewed verbally with pt. Pt able to repeat back and doing much better following them this afternoon. Required Braces or Orthoses: Knee Immobilizer - Left Knee Immobilizer - Left: Other (comment)(in bed) Restrictions Weight Bearing Restrictions: Yes LLE Weight Bearing: Weight bearing as tolerated    Mobility  Bed Mobility Overal bed mobility: Needs Assistance Bed Mobility: Rolling;Sit to Supine Rolling: Min guard     Sit to supine: Min guard   General bed mobility comments: Min guard with rolling to R side (with pillow between kness) to ensure L LE  did not adduct past midline. Advised pt to avoid rolling to this side at home without assistance. Pt able to get into bed with HOB lowered, min guard to ensure L LE in safe position. Advised pt to avoid helping L LE with R LE to prevent adduction of L LE.  Transfers Overall transfer level: Needs assistance Equipment used: Rolling walker (2 wheeled) Transfers: Sit to/from Stand Sit to Stand: Supervision         General transfer comment: Pt's sit to stand transfers are safe and steady. She does not require physical assistance from elevated toilet seat or to bed. Supervision for safety  Ambulation/Gait Ambulation/Gait assistance: Supervision Gait Distance (Feet): 60 Feet Assistive device: Rolling walker (2 wheeled) Gait Pattern/deviations: Step-through pattern;Decreased step length - right;Decreased stance time - left;Antalgic Gait velocity: decreased   General Gait Details: Pt's ambulation is safe and steady. She demonstrated safe turns to the left consistently throughout this session. Minimal v/c given to maintain precautions.   Stairs             Wheelchair Mobility    Modified Rankin (Stroke Patients Only)       Balance Overall balance assessment: Needs assistance Sitting-balance support: Feet supported;Single extremity supported Sitting balance-Leahy Scale: Good     Standing balance support: No upper extremity supported;During functional activity;Bilateral upper extremity supported Standing balance-Leahy Scale: Fair Standing balance comment: Pt able to stand without UE support but requires RW for balance and offloading of painful L LE during ambulation  Cognition Arousal/Alertness: Awake/alert Behavior During Therapy: WFL for tasks assessed/performed Overall Cognitive Status: Within Functional Limits for tasks assessed                                 General Comments: Pt taking her time with tasks finally;  seems to understand that she needs to follow her precautions to prevent another dislocation      Exercises      General Comments        Pertinent Vitals/Pain Pain Assessment: Faces Faces Pain Scale: Hurts a little bit Pain Location: L hip Pain Descriptors / Indicators: Discomfort;Guarding;Sore Pain Intervention(s): Monitored during session;Premedicated before session    Home Living                      Prior Function            PT Goals (current goals can now be found in the care plan section) Acute Rehab PT Goals Patient Stated Goal: go home with hip finally feeling better PT Goal Formulation: With patient Time For Goal Achievement: 10/06/18 Potential to Achieve Goals: Good    Frequency    7X/week      PT Plan Current plan remains appropriate    Co-evaluation              AM-PAC PT "6 Clicks" Mobility   Outcome Measure  Help needed turning from your back to your side while in a flat bed without using bedrails?: A Little Help needed moving from lying on your back to sitting on the side of a flat bed without using bedrails?: A Little Help needed moving to and from a bed to a chair (including a wheelchair)?: A Little Help needed standing up from a chair using your arms (e.g., wheelchair or bedside chair)?: None Help needed to walk in hospital room?: None Help needed climbing 3-5 steps with a railing? : A Lot 6 Click Score: 19    End of Session Equipment Utilized During Treatment: Gait belt Activity Tolerance: Patient tolerated treatment well Patient left: in bed;with bed alarm set;with call bell/phone within reach Nurse Communication: Mobility status PT Visit Diagnosis: Unsteadiness on feet (R26.81);Other abnormalities of gait and mobility (R26.89);Pain Pain - Right/Left: Left Pain - part of body: Hip     Time: 7026-3785 PT Time Calculation (min) (ACUTE ONLY): 23 min  Charges:                        Ronnell Guadalajara, SPT    Ronnell Guadalajara 09/23/2018, 3:10 PM

## 2018-09-23 NOTE — Discharge Instructions (Signed)
INSTRUCTIONS AFTER JOINT REPLACEMENT  ° °o Remove items at home which could result in a fall. This includes throw rugs or furniture in walking pathways °o ICE to the affected joint every three hours while awake for 30 minutes at a time, for at least the first 3-5 days, and then as needed for pain and swelling.  Continue to use ice for pain and swelling. You may notice swelling that will progress down to the foot and ankle.  This is normal after surgery.  Elevate your leg when you are not up walking on it.   °o Continue to use the breathing machine you got in the hospital (incentive spirometer) which will help keep your temperature down.  It is common for your temperature to cycle up and down following surgery, especially at night when you are not up moving around and exerting yourself.  The breathing machine keeps your lungs expanded and your temperature down. ° ° °DIET:  As you were doing prior to hospitalization, we recommend a well-balanced diet. ° °DRESSING / WOUND CARE / SHOWERING ° °You may change your dressing 3-5 days after surgery.  Then change the dressing every day with sterile gauze.  Please use good hand washing techniques before changing the dressing.  Do not use any lotions or creams on the incision until instructed by your surgeon. ° °ACTIVITY ° °o Increase activity slowly as tolerated, but follow the weight bearing instructions below.   °o No driving for 6 weeks or until further direction given by your physician.  You cannot drive while taking narcotics.  °o No lifting or carrying greater than 10 lbs. until further directed by your surgeon. °o Avoid periods of inactivity such as sitting longer than an hour when not asleep. This helps prevent blood clots.  °o You may return to work once you are authorized by your doctor.  ° ° ° °WEIGHT BEARING  ° °Weight bearing as tolerated with assist device (walker, cane, etc) as directed, use it as long as suggested by your surgeon or therapist, typically at  least 4-6 weeks. ° ° °EXERCISES ° °Results after joint replacement surgery are often greatly improved when you follow the exercise, range of motion and muscle strengthening exercises prescribed by your doctor. Safety measures are also important to protect the joint from further injury. Any time any of these exercises cause you to have increased pain or swelling, decrease what you are doing until you are comfortable again and then slowly increase them. If you have problems or questions, call your caregiver or physical therapist for advice.  ° °Rehabilitation is important following a joint replacement. After just a few days of immobilization, the muscles of the leg can become weakened and shrink (atrophy).  These exercises are designed to build up the tone and strength of the thigh and leg muscles and to improve motion. Often times heat used for twenty to thirty minutes before working out will loosen up your tissues and help with improving the range of motion but do not use heat for the first two weeks following surgery (sometimes heat can increase post-operative swelling).  ° °These exercises can be done on a training (exercise) mat, on the floor, on a table or on a bed. Use whatever works the best and is most comfortable for you.    Use music or television while you are exercising so that the exercises are a pleasant break in your day. This will make your life better with the exercises acting as a break   in your routine that you can look forward to.   Perform all exercises about fifteen times, three times per day or as directed.  You should exercise both the operative leg and the other leg as well. ° °Exercises include: °  °• Quad Sets - Tighten up the muscle on the front of the thigh (Quad) and hold for 5-10 seconds.   °• Straight Leg Raises - With your knee straight (if you were given a brace, keep it on), lift the leg to 60 degrees, hold for 3 seconds, and slowly lower the leg.  Perform this exercise against  resistance later as your leg gets stronger.  °• Leg Slides: Lying on your back, slowly slide your foot toward your buttocks, bending your knee up off the floor (only go as far as is comfortable). Then slowly slide your foot back down until your leg is flat on the floor again.  °• Angel Wings: Lying on your back spread your legs to the side as far apart as you can without causing discomfort.  °• Hamstring Strength:  Lying on your back, push your heel against the floor with your leg straight by tightening up the muscles of your buttocks.  Repeat, but this time bend your knee to a comfortable angle, and push your heel against the floor.  You may put a pillow under the heel to make it more comfortable if necessary.  ° °A rehabilitation program following joint replacement surgery can speed recovery and prevent re-injury in the future due to weakened muscles. Contact your doctor or a physical therapist for more information on knee rehabilitation.  ° ° °CONSTIPATION ° °Constipation is defined medically as fewer than three stools per week and severe constipation as less than one stool per week.  Even if you have a regular bowel pattern at home, your normal regimen is likely to be disrupted due to multiple reasons following surgery.  Combination of anesthesia, postoperative narcotics, change in appetite and fluid intake all can affect your bowels.  ° °YOU MUST use at least one of the following options; they are listed in order of increasing strength to get the job done.  They are all available over the counter, and you may need to use some, POSSIBLY even all of these options:   ° °Drink plenty of fluids (prune juice may be helpful) and high fiber foods °Colace 100 mg by mouth twice a day  °Senokot for constipation as directed and as needed Dulcolax (bisacodyl), take with full glass of water  °Miralax (polyethylene glycol) once or twice a day as needed. ° °If you have tried all these things and are unable to have a bowel  movement in the first 3-4 days after surgery call either your surgeon or your primary doctor.   ° °If you experience loose stools or diarrhea, hold the medications until you stool forms back up.  If your symptoms do not get better within 1 week or if they get worse, check with your doctor.  If you experience "the worst abdominal pain ever" or develop nausea or vomiting, please contact the office immediately for further recommendations for treatment. ° ° °ITCHING:  If you experience itching with your medications, try taking only a single pain pill, or even half a pain pill at a time.  You can also use Benadryl over the counter for itching or also to help with sleep.  ° °TED HOSE STOCKINGS:  Use stockings on both legs until for at least 2 weeks or as   directed by physician office. They may be removed at night for sleeping.  MEDICATIONS:  See your medication summary on the After Visit Summary that nursing will review with you.  You may have some home medications which will be placed on hold until you complete the course of blood thinner medication.  It is important for you to complete the blood thinner medication as prescribed.  PRECAUTIONS:  If you experience chest pain or shortness of breath - call 911 immediately for transfer to the hospital emergency department.   If you develop a fever greater that 101 F, purulent drainage from wound, increased redness or drainage from wound, foul odor from the wound/dressing, or calf pain - CONTACT YOUR SURGEON.                                                   FOLLOW-UP APPOINTMENTS:  If you do not already have a post-op appointment, please call the office for an appointment to be seen by your surgeon.  Guidelines for how soon to be seen are listed in your After Visit Summary, but are typically between 1-4 weeks after surgery.  OTHER INSTRUCTIONS:   Knee Replacement:  Do not place pillow under knee, focus on keeping the knee straight while resting. CPM  instructions: 0-90 degrees, 2 hours in the morning, 2 hours in the afternoon, and 2 hours in the evening. Place foam block, curve side up under heel at all times except when in CPM or when walking.  DO NOT modify, tear, cut, or change the foam block in any way.  MAKE SURE YOU:   Understand these instructions.   Get help right away if you are not doing well or get worse.    Thank you for letting us be a part of your medical care team.  It is a privilege we respect greatly.  We hope these instructions will help you stay on track for a fast and full recovery!   REMEMBER YOUR HIP PRECAUTIONS. DO NOT BREAK THE PRECAUTIONS. YOU CAN CHANGE /REINFORCE DRESSING AS NEEDED.

## 2018-09-23 NOTE — Plan of Care (Signed)
  Problem: Pain Managment: Goal: General experience of comfort will improve Outcome: Progressing   Problem: Safety: Goal: Ability to remain free from injury will improve Outcome: Progressing   

## 2018-09-23 NOTE — Evaluation (Signed)
Occupational Therapy Evaluation and Discharge Patient Details Name: Emma Lewis MRN: [id number] DOB: 1951/04/11 Today's Date: 09/23/2018    History of Present Illness Pt is a 68 yo female s/p L THA revision due to dislocation. PMH L femur fx with ORIF 05/2018, L THA revision 04/2018, admission with BLE weakness and fatigue in 06/2018. PMH vertigo, osteopenia, chronic low back pain, migraines, arthritis.   Clinical Impression   Pt familiar to therapist from previous admission. Pt today was min guard to supervision for all functional transfers, mod A for LB ADL. Pt able to recall 3/3 precautions. Educated on compensatory strategies for ADL including AE - Pt's husband is willing and able to perform assist needed. Pt able to perform toilet transfer, manage clothing and peri care and sink level grooming this session. Education complete. OT to sign off acutely. Thank you for the opportunity to serve this patient.     Follow Up Recommendations  No OT follow up;Supervision/Assistance - 24 hour    Equipment Recommendations  None recommended by OT(Pt has appropriate DME)    Recommendations for Other Services       Precautions / Restrictions Precautions Precautions: Fall;Posterior Hip Precaution Booklet Issued: Yes (comment) Precaution Comments: Pt able to recall 3/3 precautions Required Braces or Orthoses: Knee Immobilizer - Left Knee Immobilizer - Left: Other (comment)(in bed) Restrictions Weight Bearing Restrictions: Yes LLE Weight Bearing: Weight bearing as tolerated      Mobility Bed Mobility Overal bed mobility: Needs Assistance Bed Mobility: Supine to Sit;Sit to Supine Rolling: Min guard   Supine to sit: Min guard;HOB elevated Sit to supine: Min guard   General bed mobility comments: min guard for safety - no physical assist required  Transfers Overall transfer level: Needs assistance Equipment used: Rolling walker (2 wheeled) Transfers: Sit to/from Stand Sit to Stand:  Supervision         General transfer comment: Pt's sit to stand transfers are safe and steady. She does not require physical assistance from elevated toilet seat or to bed. Supervision for safety    Balance Overall balance assessment: Needs assistance Sitting-balance support: Feet supported;Single extremity supported Sitting balance-Leahy Scale: Good     Standing balance support: No upper extremity supported;During functional activity;Bilateral upper extremity supported Standing balance-Leahy Scale: Fair Standing balance comment: Pt able to stand without UE support but requires RW for balance and offloading of painful L LE during ambulation                            ADL either performed or assessed with clinical judgement   ADL Overall ADL's : Needs assistance/impaired Eating/Feeding: Independent;Sitting   Grooming: Wash/dry hands;Wash/dry face;Supervision/safety;Standing Grooming Details (indicate cue type and reason): sink level Upper Body Bathing: Set up;Sitting   Lower Body Bathing: Moderate assistance;With caregiver independent assisting;With adaptive equipment;Sitting/lateral leans Lower Body Bathing Details (indicate cue type and reason): educated on long handle sponge Upper Body Dressing : Set up;Sitting   Lower Body Dressing: Moderate assistance;Sit to/from stand;With caregiver independent assisting;With adaptive equipment Lower Body Dressing Details (indicate cue type and reason): "my husband will do all that for me" Toilet Transfer: Min guard;Adhering to hip precautions;RW;Ambulation Toilet Transfer Details (indicate cue type and reason): BSC over toilet Toileting- Clothing Manipulation and Hygiene: Min guard;Sit to/from stand   Tub/ Shower Transfer: Walk-in shower;Min guard;Ambulation   Functional mobility during ADLs: Min guard;Rolling walker General ADL Comments: Pt's husband is willing and able to asssist with LB ADL  Vision Patient Visual  Report: No change from baseline       Perception     Praxis      Pertinent Vitals/Pain Pain Assessment: Faces Faces Pain Scale: Hurts a little bit Pain Location: L hip Pain Descriptors / Indicators: Discomfort;Guarding;Sore Pain Intervention(s): Monitored during session;Repositioned     Hand Dominance Right   Extremity/Trunk Assessment Upper Extremity Assessment Upper Extremity Assessment: Overall WFL for tasks assessed   Lower Extremity Assessment Lower Extremity Assessment: Defer to PT evaluation   Cervical / Trunk Assessment Cervical / Trunk Assessment: Normal   Communication Communication Communication: No difficulties   Cognition Arousal/Alertness: Awake/alert Behavior During Therapy: WFL for tasks assessed/performed Overall Cognitive Status: Within Functional Limits for tasks assessed                                 General Comments: Pt taking her time with tasks finally; seems to understand that she needs to follow her precautions to prevent another dislocation   General Comments  VSS    Exercises     Shoulder Instructions      Home Living Family/patient expects to be discharged to:: Private residence Living Arrangements: Spouse/significant other(and dog Gracie) Available Help at Discharge: Family;Available 24 hours/day Type of Home: House Home Access: Ramped entrance     Home Layout: One level     Bathroom Shower/Tub: Occupational psychologist: Handicapped height Bathroom Accessibility: Yes How Accessible: Accessible via walker;Accessible via wheelchair(depending on the bathroom) Home Equipment: Walker - 2 wheels;Cane - single point;Bedside commode;Crutches;Shower seat - built in;Wheelchair - manual;Toilet riser;Hand held shower head          Prior Functioning/Environment Level of Independence: Independent with assistive device(s)        Comments: Pt states she has been using a RW for ambulation since her original  surgery in Oct 2019. Before her prior hospitalization (06/2018) she had a period of w/c use.        OT Problem List: Decreased range of motion;Decreased activity tolerance;Impaired balance (sitting and/or standing);Decreased knowledge of precautions;Pain      OT Treatment/Interventions:      OT Goals(Current goals can be found in the care plan section) Acute Rehab OT Goals Patient Stated Goal: go home with hip finally feeling better to see Gracie OT Goal Formulation: With patient Time For Goal Achievement: 10/07/18 Potential to Achieve Goals: Good  OT Frequency:     Barriers to D/C:            Co-evaluation              AM-PAC OT "6 Clicks" Daily Activity     Outcome Measure Help from another person eating meals?: None Help from another person taking care of personal grooming?: A Little Help from another person toileting, which includes using toliet, bedpan, or urinal?: A Little Help from another person bathing (including washing, rinsing, drying)?: A Lot Help from another person to put on and taking off regular upper body clothing?: A Little Help from another person to put on and taking off regular lower body clothing?: A Lot 6 Click Score: 17   End of Session Equipment Utilized During Treatment: Gait belt;Rolling walker Nurse Communication: Mobility status;Precautions;Weight bearing status  Activity Tolerance: Patient tolerated treatment well Patient left: in bed;with call bell/phone within reach;with bed alarm set  OT Visit Diagnosis: Unsteadiness on feet (R26.81);Other abnormalities of gait and mobility (R26.89);Pain Pain - Right/Left:  Left Pain - part of body: Hip                Time: 4715-9539 OT Time Calculation (min): 34 min Charges:  OT General Charges $OT Visit: 1 Visit OT Evaluation $OT Eval Moderate Complexity: 1 Mod OT Treatments $Self Care/Home Management : 8-22 mins  Hulda Humphrey OTR/L Acute Rehabilitation Services Pager:  [phone] Office: [facility] 09/23/2018, 4:58 PM

## 2018-09-23 NOTE — Progress Notes (Signed)
   Subjective: 2 Days Post-Op Procedure(s) (LRB): left total hip arthroplasty femoral revision (Left) Patient reports pain as mild and moderate.   All cultures neg at 49 hrs aerobic and anaerobic. Hip drainage onto dressing when she got up. Reinforced.  Objective: Vital signs in last 24 hours: Temp:  [98.1 F (36.7 C)-98.6 F (37 C)] 98.6 F (37 C) (02/26 1415) Pulse Rate:  [82-92] 82 (02/26 1415) Resp:  [16-18] 16 (02/26 1415) BP: (103-118)/(51-92) 103/65 (02/26 1415) SpO2:  [92 %-100 %] 92 % (02/26 1415)  Intake/Output from previous day: 02/25 0701 - 02/26 0700 In: 720 [P.O.:720] Out: -  Intake/Output this shift: Total I/O In: 240 [P.O.:240] Out: -   Recent Labs    09/21/18 1025 09/22/18 0224 09/23/18 0255  HGB 13.5 9.7* 9.3*   Recent Labs    09/22/18 0224 09/23/18 0255  WBC 6.9 7.0  RBC 3.49* 3.36*  HCT 31.4* 30.3*  PLT 206 185   Recent Labs    09/21/18 1025  NA 138  K 3.7  CL 106  CO2 25  BUN 14  CREATININE 0.70  GLUCOSE 95  CALCIUM 10.1   No results for input(s): LABPT, INR in the last 72 hours.  Neurologically intact  Leg length even.  No results found.  Assessment/Plan: 2 Days Post-Op Procedure(s) (LRB): left total hip arthroplasty femoral revision (Left) Plan:  Discharge home office next week. Dressing changed.   Emma Lewis 09/23/2018, 5:45 PM

## 2018-09-23 NOTE — Progress Notes (Signed)
   Subjective: 2 Days Post-Op Procedure(s) (LRB): left total hip arthroplasty femoral revision (Left) Patient reports pain as moderate.    Objective: Vital signs in last 24 hours: Temp:  [97.8 F (36.6 C)-98.6 F (37 C)] 98.6 F (37 C) (02/26 0421) Pulse Rate:  [74-92] 92 (02/26 0421) Resp:  [17-18] 18 (02/26 0421) BP: (99-123)/(45-92) 118/92 (02/26 0421) SpO2:  [92 %-100 %] 92 % (02/26 0421)  Intake/Output from previous day: 02/25 0701 - 02/26 0700 In: 720 [P.O.:720] Out: -  Intake/Output this shift: No intake/output data recorded.  Recent Labs    09/21/18 1025 09/22/18 0224 09/23/18 0255  HGB 13.5 9.7* 9.3*   Recent Labs    09/22/18 0224 09/23/18 0255  WBC 6.9 7.0  RBC 3.49* 3.36*  HCT 31.4* 30.3*  PLT 206 185   Recent Labs    09/21/18 1025  NA 138  K 3.7  CL 106  CO2 25  BUN 14  CREATININE 0.70  GLUCOSE 95  CALCIUM 10.1   No results for input(s): LABPT, INR in the last 72 hours.  Neurologically intact No results found.  Assessment/Plan: 2 Days Post-Op Procedure(s) (LRB): left total hip arthroplasty femoral revision (Left) Up with therapy. Needs work on hip precautions. Therapy notes reviewed. Continue therapy today , work on hip precautions, possible home late today.   Emma Lewis 09/23/2018, 7:43 AM

## 2018-09-23 NOTE — Plan of Care (Signed)

## 2018-09-25 ENCOUNTER — Other Ambulatory Visit (INDEPENDENT_AMBULATORY_CARE_PROVIDER_SITE_OTHER): Payer: Self-pay | Admitting: Orthopaedic Surgery

## 2018-09-25 NOTE — Telephone Encounter (Signed)
Ok for refill? 

## 2018-09-26 LAB — AEROBIC/ANAEROBIC CULTURE W GRAM STAIN (SURGICAL/DEEP WOUND)
Culture: NO GROWTH
Gram Stain: NONE SEEN

## 2018-09-29 NOTE — Discharge Summary (Signed)
Patient ID: JENE HUQ MRN: [id number] DOB/AGE: 1951/03/07 68 y.o.  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 Allergies   . Arthritis    "all over my body" (04/29/2018)  . Chronic lower back pain   . Fatty liver   . Fever blister    Takes Acyclovir  . GERD (gastroesophageal reflux disease)   . Hyperlipemia   . Hypothyroidism   . Migraines   . Osteopenia   . Pneumonia    "once; years ago" (04/29/2018)  . PONV (postoperative nausea and vomiting)    "dry heaves"  . Pre-diabetes   . Seasonal allergies   . Vertigo     Surgeries: Procedure(s): left total hip arthroplasty femoral revision on 09/21/2018   Consultants:   Discharged Condition: Improved  Hospital Course: ESHANI MAESTRE is an 68 y.o. female who was admitted 09/21/2018 for operative treatment of failed total hip arthroplasty. Patient failed conservative treatments (please see the history and physical for the specifics) and had severe unremitting pain that affects sleep, daily activities and work/hobbies. After pre-op clearance, the patient was taken to the operating room on 09/21/2018 and underwent  Procedure(s): left total hip arthroplasty femoral revision.    Patient was given perioperative antibiotics:  Anti-infectives (From admission, onward)   Start     Dose/Rate Route Frequency Ordered Stop   09/22/18 1000  acyclovir (ZOVIRAX) tablet 400 mg  Status:  Discontinued    Note to Pharmacy:  Patient taking differently: Take one tablet (400 mg) by mouth daily, may increase to one tablet (400 mg) twice daily as needed for fever blisters.     400 mg Oral Daily 09/21/18 1737  09/23/18 2156   09/21/18 2200  ciprofloxacin (CIPRO) tablet 750 mg  Status:  Discontinued     750 mg Oral 2 times daily 09/21/18 1737 09/23/18 2156   09/21/18 1930  ceFAZolin (ANCEF) IVPB 2g/100 mL premix     2 g 200 mL/hr over 30 Minutes Intravenous Every 6 hours 09/21/18 1737 09/22/18 0046   09/21/18 1440  vancomycin (VANCOCIN) powder  Status:  Discontinued       As needed 09/21/18 1441 09/21/18 1606   09/21/18 0915  ceFAZolin (ANCEF) IVPB 2g/100 mL premix     2 g 200 mL/hr over 30 Minutes Intravenous On call to O.R. 09/21/18 0902 09/21/18 1330       Patient was given sequential compression devices and early ambulation to prevent DVT.   Patient benefited maximally from hospital stay and there were no complications. At the time of discharge, the patient was urinating/moving their bowels without difficulty, tolerating a regular diet, pain is controlled with oral pain medications and they have been cleared by PT/OT.   Recent vital signs: No data found.   Recent laboratory studies: No results for input(s): WBC, HGB, HCT, PLT, NA, K, CL, CO2, BUN, CREATININE, GLUCOSE, INR, CALCIUM in the last 72 hours.  Invalid input(s): PT, 2   Discharge Medications:   Allergies as of 09/23/2018      Reactions   Sulfa Antibiotics Other (See Comments)    Childhood allergy Mother said "I liked to have died" UNSPECIFIED "SEVERE CHILDHOOD REACTION"  Medication List    STOP taking these medications   ibuprofen 200 MG tablet Commonly known as:  ADVIL,MOTRIN     TAKE these medications   acyclovir 400 MG tablet Commonly known as:  ZOVIRAX Take 1 tablet (400 mg total) by mouth 2 (two) times daily. What changed:    when to take this  additional instructions   Biotin Plus Keratin 10000-100 MCG-MG Tabs Take 1 tablet by mouth 3 (three) times daily.   CALCIUM 600 + D PO Take 1 tablet by mouth 3 (three) times daily.   clobetasol cream 0.05 % Commonly known as:  TEMOVATE Apply 1  application topically 2 (two) times daily. What changed:    when to take this  reasons to take this   ferrous sulfate 325 (65 FE) MG tablet Take 1 tablet (325 mg total) by mouth 2 (two) times daily with a meal.   HYDROcodone-acetaminophen 7.5-325 MG tablet Commonly known as:  NORCO Take 1 tablet by mouth every 4 (four) hours as needed for moderate pain.   levothyroxine 100 MCG tablet Commonly known as:  SYNTHROID, LEVOTHROID Take 1 tablet (100 mcg total) by mouth daily before breakfast.   meclizine 25 MG tablet Commonly known as:  ANTIVERT Take 1 tablet (25 mg total) by mouth 3 (three) times daily as needed for dizziness. Vertigo. Also available OTC What changed:    when to take this  reasons to take this  additional instructions   nystatin-triamcinolone cream Commonly known as:  MYCOLOG II Apply 1 application topically 2 (two) times daily as needed (irritation).   OMEGA-3 FISH OIL PO Take 1 capsule by mouth 3 (three) times daily.   omeprazole 20 MG tablet Commonly known as:  PRILOSEC OTC Take 10 mg by mouth daily.   simvastatin 40 MG tablet Commonly known as:  ZOCOR Take 1 tablet (40 mg total) by mouth at bedtime.   THERATEARS 0.25 % Soln Generic drug:  Carboxymethylcellulose Sodium Place 1 drop into both eyes 2 (two) times daily as needed (dry eyes).   Vitamin D3 50 MCG (2000 UT) Tabs Take 2,000 Units by mouth 2 (two) times daily.       Diagnostic Studies: Dg Pelvis Portable  Result Date: 09/21/2018 CLINICAL DATA:  Postop EXAM: PORTABLE PELVIS 1-2 VIEWS COMPARISON:  09/16/2018 FINDINGS: Interval reduction of the previously seen dislocated left hip replacement. Bilateral hip replacements are noted with normal AP alignment. No visible hardware or bony complicating feature. IMPRESSION: Interval reduction of the dislocated left hip replacement. Normal AP alignment. Electronically Signed   By: Rolm Baptise M.D.   On: 09/21/2018 18:40   Dg C-arm 1-60  Min  Result Date: 09/21/2018 CLINICAL DATA:  Left hip arthroplasty revision EXAM: OPERATIVE LEFT HIP WITH PELVIS; DG C-ARM 61-120 MIN COMPARISON:  09/16/2018 FLUOROSCOPY TIME:  Fluoroscopy Time:  12 seconds Radiation Exposure Index (if provided by the fluoroscopic device): Not available Number of Acquired Spot Images: 3 FINDINGS: Previously seen dislocation has been reduced. Fixation sideplate and multiple fixation screws are again identified. IMPRESSION: Left hip prosthesis in satisfactory position. Electronically Signed   By: Inez Catalina M.D.   On: 09/21/2018 15:59   Dg Hip Operative Unilat W Or W/o Pelvis Left  Result Date: 09/21/2018 CLINICAL DATA:  Left hip arthroplasty revision EXAM: OPERATIVE LEFT HIP WITH PELVIS; DG C-ARM 61-120 MIN COMPARISON:  09/16/2018 FLUOROSCOPY TIME:  Fluoroscopy Time:  12 seconds Radiation Exposure Index (if provided by the fluoroscopic device): Not available Number of Acquired Spot Images: 3  FINDINGS: Previously seen dislocation has been reduced. Fixation sideplate and multiple fixation screws are again identified. IMPRESSION: Left hip prosthesis in satisfactory position. Electronically Signed   By: Inez Catalina M.D.   On: 09/21/2018 15:59   Dg Femur Min 2 Views Left  Result Date: 09/21/2018 CLINICAL DATA:  Postop EXAM: LEFT FEMUR 2 VIEWS COMPARISON:  09/16/2018 FINDINGS: Lateral plate and screw fixation device and left hip replacement hardware noted within the left femur. No hardware complicating feature. No fracture, subluxation or dislocation. IMPRESSION: No acute bony abnormality. Electronically Signed   By: Rolm Baptise M.D.   On: 09/21/2018 18:41   Xr Femur Min 2 Views Left  Result Date: 09/16/2018 Multiple views left femur obtained and reviewed.  This shows lateral plate fixation of periprosthetic femur fracture with total of 10 cables 10 screws in satisfactory position with intact femoral tube without displacement of fractures.  Femoral stem subsidence with  rotation and dislocation of the left total hip. Impression: Left total hip arthroplasty dislocation with some subsidence measured 4 to 5 mm and rotation of the femoral stem.  Xr Hip Unilat W Or W/o Pelvis 2-3 Views Left  Result Date: 09/16/2018 AP pelvis x-rays obtained and reviewed.  This shows hip dislocation of the left hip with rotation of the femoral stem acetabulum is in good position with 2 screws present.  Right total hip arthroplasty shows good position alignment.  Multiple cables and screws are present in the left femur unchanged from previous x-ray. Impression: Left total hip arthroplasty dislocation with some subsidence and rotation of the femoral component.     Follow-up Information    [name], MD Follow up in 1 week(s).   Specialty:  Orthopedic Surgery Contact information: [place] [address] [phone]           Discharge Plan:  discharge to home  Disposition:     Signed: Benjiman Core for mark yates MD 09/29/2018, 12:58 PM

## 2018-10-02 ENCOUNTER — Ambulatory Visit (INDEPENDENT_AMBULATORY_CARE_PROVIDER_SITE_OTHER): Payer: Medicare Other

## 2018-10-02 ENCOUNTER — Encounter (INDEPENDENT_AMBULATORY_CARE_PROVIDER_SITE_OTHER): Payer: Self-pay | Admitting: Orthopaedic Surgery

## 2018-10-02 ENCOUNTER — Ambulatory Visit (INDEPENDENT_AMBULATORY_CARE_PROVIDER_SITE_OTHER): Payer: Medicare Other | Admitting: Orthopaedic Surgery

## 2018-10-02 VITALS — BP 128/74 | HR 85 | Ht 65.0 in | Wt 190.0 lb

## 2018-10-02 DIAGNOSIS — M25552 Pain in left hip: Secondary | ICD-10-CM

## 2018-10-02 NOTE — Progress Notes (Signed)
Post-Op Visit Note   Patient: Emma Lewis           Date of Birth: 10/21/1950           MRN: [id number] Visit Date: 10/02/2018 PCP: Philmore Pali, NP   Assessment & Plan: Post femoral revision for dislocation and migrated stem.  X-rays look good her stem is cemented.  Hip is reduced.  She is able to walk and appears to be about done 820 fourth inch too long which was the identical link she states she had prior to the initial revision surgery.  She is gradually increasing her distance she still taking oral antibiotics.  She will return in 2 weeks for wound check and possible staple removal.  Chief Complaint:  Chief Complaint  Patient presents with  . Left Hip - Routine Post Op    09/21/2018 Left Total Hip Femoral Revision   Visit Diagnoses:  1. Pain in left hip     Plan: Return 2 weeks staples left hand she still has some serosanguineous drainage.  Staples are intact and x-rays look good.  Continue p.o. antibiotics.  Follow-Up Instructions: No follow-ups on file.   Orders:  Orders Placed This Encounter  Procedures  . XR FEMUR MIN 2 VIEWS LEFT   No orders of the defined types were placed in this encounter.   Imaging: Xr Femur Min 2 Views Left  Result Date: 10/02/2018 AP lateral femur x-rays are obtained this shows longstem revision cemented with long plate that extends down to the knee.  Hip is located.  No complications are noted. Impression post hip revision for dislocation with femoral stem revision satisfactory position and alignment.   PMFS History: Patient Active Problem List   Diagnosis Date Noted  . Symptomatic anemia   . Ambulatory dysfunction 07/21/2018  . Weakness of both legs 07/21/2018  . Proteus mirabilis infection   . Hip hematoma, left 06/19/2018  . Hematoma of left hip 06/19/2018  . Hip dislocation, left ([place]) 05/29/2018  . Peri-prosthetic femur fracture at tip of prosthesis 05/29/2018  . Prediabetes   . Popping sound of knee joint   . Hypoalbuminemia  due to protein-calorie malnutrition ([place])   . Transaminitis   . Leukocytosis   . Postoperative pain   . 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 Acute blood loss anemia   . Leukemoid reaction   . 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 Joint effusion of pelvis or thigh, left 04/22/2018  . Trochanteric bursitis, left hip 04/22/2018  . History of left hip replacement 11/12/2017  . Bilateral primary osteoarthritis of knee 11/12/2017  . GERD (gastroesophageal reflux disease) 01/17/2017  . Hypothyroidism 01/17/2017  . Vertigo 01/16/2017  . Nausea and vomiting 01/16/2017  . Essential hypertension 01/16/2017  . Fatty liver 01/16/2017  . Failed total hip arthroplasty ([place]) 06/10/2016  . Pelvic mass 06/15/2015  . Closed wedge compression fracture of fifth lumbar vertebra ([place])   . Cervical spondylosis 02/09/2014  . DDD (degenerative disc disease), lumbar 05/13/2013  . OA (osteoarthritis) 05/13/2013   Past Medical History:  Diagnosis Date  . Allergies   . Arthritis    "all over my body" (04/29/2018)  . Chronic lower back pain   . Fatty liver   . Fever blister    Takes Acyclovir  . GERD (gastroesophageal reflux disease)   . Hyperlipemia   . Hypothyroidism   . Migraines   . Osteopenia   . Pneumonia    "once; years  ago" (04/29/2018)  . PONV (postoperative nausea and vomiting)    "dry heaves"  . Pre-diabetes   . Seasonal allergies   . Vertigo     Family History  Problem Relation Age of Onset  . Lymphoma Mother     Past Surgical History:  Procedure Laterality Date  . ANTERIOR CERVICAL DECOMP/DISCECTOMY FUSION N/A 02/09/2014   Procedure: ANTERIOR CERVICAL DECOMPRESSION/DISCECTOMY FUSION 2 LEVELS;  Surgeon: [name], MD;  Location: [place];  Service: Orthopedics;  Laterality: N/A;  C4-5, C5-6 Anterior Cervical Discectomy and Fusion, Allograft, Plate  . BACK SURGERY    . CARPAL TUNNEL RELEASE Right 2018  . CATARACT EXTRACTION W/ INTRAOCULAR LENS   IMPLANT, BILATERAL    . COLONOSCOPY    . FIXATION KYPHOPLASTY LUMBAR SPINE  X 2  . HIP ARTHROPLASTY Left 2011  . INCISION AND DRAINAGE HIP Left 06/19/2018   Procedure: IRRIGATION AND DEBRIDEMENT LEFT HIP HEMATOMA ANTIBIOTIC BEADS, APPLICATION ON WOUND VAC;  Surgeon: [name], MD;  Location: [place];  Service: Orthopedics;  Laterality: Left;  . JOINT REPLACEMENT    . KNEE ARTHROSCOPY Right   . ORIF PERIPROSTHETIC FRACTURE Left 06/01/2018   Procedure: OPEN REDUCTION INTERNAL FIXATION (ORIF) PERIPROSTHETIC FEMUR FRACTURE;  Surgeon: [name], MD;  Location: [place];  Service: Orthopedics;  Laterality: Left;  . PATELLA FRACTURE SURGERY Right ~ 1990   "put metal in"  . PATELLA HARDWARE REMOVAL Right    "took the metal out"  . REVISION TOTAL HIP ARTHROPLASTY Left 04/29/2018  . ROBOTIC ASSISTED BILATERAL SALPINGO OOPHERECTOMY Bilateral 08/01/2015   Procedure: ROBOTIC ASSISTED BILATERAL SALPINGO OOPHORECTOMY;  Surgeon: Everitt Amber, MD;  Location: [facility];  Service: Gynecology;  Laterality: Bilateral;  . SHOULDER OPEN ROTATOR CUFF REPAIR Right    3 TOTAL  . SHOULDER SURGERY Right   . TOTAL HIP ARTHROPLASTY Right 01/21/2018   Procedure: RIGHT TOTAL HIP ARTHROPLASTY DIRECT ANTERIOR;  Surgeon: [name], MD;  Location: [place];  Service: Orthopedics;  Laterality: Right;  . TOTAL HIP REVISION Left 06/10/2016   Procedure: TOTAL HIP REVISION ARTHROPLASTY;  Surgeon: Rod Can, MD;  Location: [place];  Service: Orthopedics;  Laterality: Left;  . TOTAL HIP REVISION Left 04/29/2018   Procedure: left total hip arthroplasty revision posterior approach;  Surgeon: [name], MD;  Location: [place];  Service: Orthopedics;  Laterality: Left;  . TOTAL HIP REVISION Left 09/21/2018   Procedure: left total hip arthroplasty femoral revision;  Surgeon: [name], MD;  Location: [place];  Service: Orthopedics;  Laterality: Left;  . WRIST GANGLION EXCISION Left 1970s   Social History   Occupational History  .  Not on file  Tobacco Use  . Smoking status: Never Smoker  . Smokeless tobacco: Never Used  Substance and Sexual Activity  . Alcohol use: Not Currently    Comment: `  . Drug use: Never  . Sexual activity: Not Currently

## 2018-10-16 ENCOUNTER — Ambulatory Visit (INDEPENDENT_AMBULATORY_CARE_PROVIDER_SITE_OTHER): Payer: Medicare Other | Admitting: Orthopaedic Surgery

## 2018-10-16 ENCOUNTER — Other Ambulatory Visit: Payer: Self-pay

## 2018-10-16 DIAGNOSIS — T84011S Broken internal left hip prosthesis, sequela: Secondary | ICD-10-CM

## 2018-10-16 NOTE — Progress Notes (Signed)
Post-Op Visit Note   Patient: Emma Lewis           Date of Birth: 03/13/1951           MRN: [id number] Visit Date: 10/16/2018 PCP: Philmore Pali, NP   Assessment & Plan: Postop cemented revision for dislocation and stem migration longstem with cables and cable plate system.  Staples are good she has no incision she had problems with infection.  She is still on p.o. antibiotics I will recheck her again in 2 months.  X-rays left entire femur on return.  Currently she is able to apply weight she is using crutches but does not have pain when she weight-bear she states her leg still feels tight.  Chief Complaint:  Chief Complaint  Patient presents with  . Left Leg - Follow-up    09/21/2018 Left Total Hip Arthroplasty Femoral Revision   Visit Diagnoses:  1. Failure of left total hip arthroplasty, sequela     Plan: Return 2 months for x-rays.  Follow-Up Instructions: Return in about 2 months (around 12/16/2018).   Orders:  No orders of the defined types were placed in this encounter.  No orders of the defined types were placed in this encounter.   Imaging: No results found.  PMFS History: Patient Active Problem List   Diagnosis Date Noted  . Symptomatic anemia   . Ambulatory dysfunction 07/21/2018  . Weakness of both legs 07/21/2018  . Proteus mirabilis infection   . Hip hematoma, left 06/19/2018  . Hematoma of left hip 06/19/2018  . Hip dislocation, left ([place]) 05/29/2018  . Peri-prosthetic femur fracture at tip of prosthesis 05/29/2018  . Prediabetes   . Popping sound of knee joint   . Hypoalbuminemia due to protein-calorie malnutrition ([place])   . Transaminitis   . Leukocytosis   . Postoperative pain   . 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 Acute blood loss anemia   . Leukemoid reaction   . 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 Joint effusion of pelvis or thigh, left 04/22/2018  . Trochanteric bursitis, left hip 04/22/2018  . History  of left hip replacement 11/12/2017  . Bilateral primary osteoarthritis of knee 11/12/2017  . GERD (gastroesophageal reflux disease) 01/17/2017  . Hypothyroidism 01/17/2017  . Vertigo 01/16/2017  . Nausea and vomiting 01/16/2017  . Essential hypertension 01/16/2017  . Fatty liver 01/16/2017  . Failed total hip arthroplasty ([place]) 06/10/2016  . Pelvic mass 06/15/2015  . Closed wedge compression fracture of fifth lumbar vertebra ([place])   . Cervical spondylosis 02/09/2014  . DDD (degenerative disc disease), lumbar 05/13/2013  . OA (osteoarthritis) 05/13/2013   Past Medical History:  Diagnosis Date  . Allergies   . Arthritis    "all over my body" (04/29/2018)  . Chronic lower back pain   . Fatty liver   . Fever blister    Takes Acyclovir  . GERD (gastroesophageal reflux disease)   . Hyperlipemia   . Hypothyroidism   . Migraines   . Osteopenia   . Pneumonia    "once; years ago" (04/29/2018)  . PONV (postoperative nausea and vomiting)    "dry heaves"  . Pre-diabetes   . Seasonal allergies   . Vertigo     Family History  Problem Relation Age of Onset  . Lymphoma Mother     Past Surgical History:  Procedure Laterality Date  . ANTERIOR CERVICAL DECOMP/DISCECTOMY FUSION N/A 02/09/2014   Procedure: ANTERIOR CERVICAL DECOMPRESSION/DISCECTOMY FUSION 2 LEVELS;  Surgeon: [name], MD;  Location: [place];  Service: Orthopedics;  Laterality: N/A;  C4-5, C5-6 Anterior Cervical Discectomy and Fusion, Allograft, Plate  . BACK SURGERY    . CARPAL TUNNEL RELEASE Right 2018  . CATARACT EXTRACTION W/ INTRAOCULAR LENS  IMPLANT, BILATERAL    . COLONOSCOPY    . FIXATION KYPHOPLASTY LUMBAR SPINE  X 2  . HIP ARTHROPLASTY Left 2011  . INCISION AND DRAINAGE HIP Left 06/19/2018   Procedure: IRRIGATION AND DEBRIDEMENT LEFT HIP HEMATOMA ANTIBIOTIC BEADS, APPLICATION ON WOUND VAC;  Surgeon: [name], MD;  Location: [place];  Service: Orthopedics;  Laterality: Left;  . JOINT REPLACEMENT    . KNEE  ARTHROSCOPY Right   . ORIF PERIPROSTHETIC FRACTURE Left 06/01/2018   Procedure: OPEN REDUCTION INTERNAL FIXATION (ORIF) PERIPROSTHETIC FEMUR FRACTURE;  Surgeon: [name], MD;  Location: [employer];  Service: Orthopedics;  Laterality: Left;  . PATELLA FRACTURE SURGERY Right ~ 1990   "put metal in"  . PATELLA HARDWARE REMOVAL Right    "took the metal out"  . REVISION TOTAL HIP ARTHROPLASTY Left 04/29/2018  . ROBOTIC ASSISTED BILATERAL SALPINGO OOPHERECTOMY Bilateral 08/01/2015   Procedure: ROBOTIC ASSISTED BILATERAL SALPINGO OOPHORECTOMY;  Surgeon: Everitt Amber, MD;  [facility];  Service: Gynecology;  Laterality: Bilateral;  . SHOULDER OPEN ROTATOR CUFF REPAIR Right    3 TOTAL  . SHOULDER SURGERY Right   . TOTAL HIP ARTHROPLASTY Right 01/21/2018   Procedure: RIGHT TOTAL HIP ARTHROPLASTY DIRECT ANTERIOR;  Surgeon: [name], MD;  Location: [place];  Service: Orthopedics;  Laterality: Right;  . TOTAL HIP REVISION Left 06/10/2016   Procedure: TOTAL HIP REVISION ARTHROPLASTY;  Surgeon: Rod Can, MD;  Location: [place];  Service: Orthopedics;  Laterality: Left;  . TOTAL HIP REVISION Left 04/29/2018   Procedure: left total hip arthroplasty revision posterior approach;  Surgeon: [name], MD;  Location: [place];  Service: Orthopedics;  Laterality: Left;  . TOTAL HIP REVISION Left 09/21/2018   Procedure: left total hip arthroplasty femoral revision;  Surgeon: [name], MD;  Location: [place];  Service: Orthopedics;  Laterality: Left;  . WRIST GANGLION EXCISION Left 1970s   Social History   Occupational History  . Not on file  Tobacco Use  . Smoking status: Never Smoker  . Smokeless tobacco: Never Used  Substance and Sexual Activity  . Alcohol use: Not Currently    Comment: `  . Drug use: Never  . Sexual activity: Not Currently

## 2018-11-16 DIAGNOSIS — B001 Herpesviral vesicular dermatitis: Secondary | ICD-10-CM | POA: Diagnosis not present

## 2018-11-16 DIAGNOSIS — E785 Hyperlipidemia, unspecified: Secondary | ICD-10-CM | POA: Diagnosis not present

## 2018-11-16 DIAGNOSIS — E039 Hypothyroidism, unspecified: Secondary | ICD-10-CM | POA: Diagnosis not present

## 2018-11-16 DIAGNOSIS — R7303 Prediabetes: Secondary | ICD-10-CM | POA: Diagnosis not present

## 2018-11-26 ENCOUNTER — Other Ambulatory Visit: Payer: Self-pay

## 2018-11-27 ENCOUNTER — Encounter: Payer: Medicare Other | Admitting: Obstetrics & Gynecology

## 2018-12-01 ENCOUNTER — Other Ambulatory Visit: Payer: Self-pay

## 2018-12-01 ENCOUNTER — Encounter: Payer: Self-pay | Admitting: Obstetrics & Gynecology

## 2018-12-01 ENCOUNTER — Ambulatory Visit (INDEPENDENT_AMBULATORY_CARE_PROVIDER_SITE_OTHER): Payer: Medicare Other | Admitting: Obstetrics & Gynecology

## 2018-12-01 VITALS — BP 134/90 | Ht 65.0 in | Wt 194.0 lb

## 2018-12-01 DIAGNOSIS — R3915 Urgency of urination: Secondary | ICD-10-CM

## 2018-12-01 DIAGNOSIS — M810 Age-related osteoporosis without current pathological fracture: Secondary | ICD-10-CM | POA: Diagnosis not present

## 2018-12-01 DIAGNOSIS — E6609 Other obesity due to excess calories: Secondary | ICD-10-CM | POA: Diagnosis not present

## 2018-12-01 DIAGNOSIS — Z780 Asymptomatic menopausal state: Secondary | ICD-10-CM

## 2018-12-01 DIAGNOSIS — Z01419 Encounter for gynecological examination (general) (routine) without abnormal findings: Secondary | ICD-10-CM

## 2018-12-01 DIAGNOSIS — Z6832 Body mass index (BMI) 32.0-32.9, adult: Secondary | ICD-10-CM | POA: Diagnosis not present

## 2018-12-01 NOTE — Progress Notes (Signed)
Emma Lewis 06-30-1951 [id number]   History:    68 y.o. G1P1L1 Married  RP:  Established patient presenting for annual gyn exam   HPI: Menopause, well on no HRT.  No PMB.  S/P BSO for a benign Teratoma in 2017.  No pelvic pain.  Abstinent.  Breasts normal.  Urinary Urgency and leakage.  BMs normal.  BMI 32.28.  Left hip pain, post revision of left hip replacement in 2017, preventing physical activity.  On Fosamax for osteoporosis.  Health Labs with Emma Holler NP.  Past medical history,surgical history, family history and social history were all reviewed and documented in the EPIC chart.  Gynecologic History No LMP recorded. Patient is postmenopausal. Contraception: post menopausal status Last Pap: 05/2015. Results were: Negative Last mammogram: 2019, will obtain report. Bone Density: Will obtain report, per patient BD done since 2016. Colonoscopy: 2015  Obstetric History OB History  Gravida Para Term Preterm AB Living  1       0 1  SAB TAB Ectopic Multiple Live Births        0      # Outcome Date GA Lbr Len/2nd Weight Sex Delivery Anes PTL Lv  1 Gravida              ROS: A ROS was performed and pertinent positives and negatives are included in the history.  GENERAL: No fevers or chills. HEENT: No change in vision, no earache, sore throat or sinus congestion. NECK: No pain or stiffness. CARDIOVASCULAR: No chest pain or pressure. No palpitations. PULMONARY: No shortness of breath, cough or wheeze. GASTROINTESTINAL: No abdominal pain, nausea, vomiting or diarrhea, melena or bright red blood per rectum. GENITOURINARY: No urinary frequency, urgency, hesitancy or dysuria. MUSCULOSKELETAL: No joint or muscle pain, no back pain, no recent trauma. DERMATOLOGIC: No rash, no itching, no lesions. ENDOCRINE: No polyuria, polydipsia, no heat or cold intolerance. No recent change in weight. HEMATOLOGICAL: No anemia or easy bruising or bleeding. NEUROLOGIC: No headache, seizures, numbness, tingling  or weakness. PSYCHIATRIC: No depression, no loss of interest in normal activity or change in sleep pattern.     Exam:   Ht 5\' 5"  (1.651 m)   Wt 194 lb (88 kg)   BMI 32.28 kg/m   Body mass index is 32.28 kg/m.  General appearance : Well developed well nourished female. No acute distress HEENT: Eyes: no retinal hemorrhage or exudates,  Neck supple, trachea midline, no carotid bruits, no thyroidmegaly Lungs: Clear to auscultation, no rhonchi or wheezes, or rib retractions  Heart: Regular rate and rhythm, no murmurs or gallops Breast:Examined in sitting and supine position were symmetrical in appearance, no palpable masses or tenderness,  no skin retraction, no nipple inversion, no nipple discharge, no skin discoloration, no axillary or supraclavicular lymphadenopathy Abdomen: no palpable masses or tenderness, no rebound or guarding Extremities: no edema or skin discoloration or tenderness  Pelvic: Vulva: Normal             Vagina: No gross lesions or discharge  Cervix: No gross lesions or discharge  Uterus  AV, normal size, shape and consistency, non-tender and mobile  Adnexa  Without masses or tenderness  Anus: Normal   Assessment/Plan:  68 y.o. female for annual exam   1. Well female exam with routine gynecological exam Normal gynecologic exam and menopause.  Pap test November 2016 was negative, patient is 35 and abstinent, no indication to repeat at this time.  Breast exam normal.  Screening mammogram in 2019,  will obtain report.  Patient will repeat screening mammogram when due.  Colonoscopy in 2015.  Health labs with nurse practitioner.  2. Postmenopause Well on no hormone replacement therapy.  No postmenopausal bleeding.  3. Age-related osteoporosis without current pathological fracture Osteoporosis on Fosamax.  Per patient, up-to-date with bone density, will obtain report.  Vitamin D supplements, calcium intake of 1200 to 1500 mg daily and weightbearing physical activities  recommended.  4. Urinary urgency Urinary urgency, with some incontinence.  Referred to urology.  Avoid irritants to the bladder such as caffeine products.  Recommend good hydration with water.  5. Class 1 obesity due to excess calories with serious comorbidity and body mass index (BMI) of 32.0 to 32.9 in adult Recommend lower calorie/carb diet such as [employer].  Aerobic physical activities 5 times a week and weightlifting every 2 days.  Other orders - atorvastatin (LIPITOR) 40 MG tablet; Take 40 mg by mouth daily. - alendronate (FOSAMAX) 70 MG tablet; Take 70 mg by mouth once a week. Take with a full glass of water on an empty stomach.  Counseling on above issues and coordination of care more than 50% for 10 minutes.  Emma Bruins MD, 11:16 AM 12/01/2018

## 2018-12-04 ENCOUNTER — Encounter: Payer: Self-pay | Admitting: Obstetrics & Gynecology

## 2018-12-04 NOTE — Patient Instructions (Signed)
1. Well female exam with routine gynecological exam Normal gynecologic exam and menopause.  Pap test November 2016 was negative, patient is 59 and abstinent, no indication to repeat at this time.  Breast exam normal.  Screening mammogram in 2019, will obtain report.  Patient will repeat screening mammogram when due.  Colonoscopy in 2015.  Health labs with nurse practitioner.  2. Postmenopause Well on no hormone replacement therapy.  No postmenopausal bleeding.  3. Age-related osteoporosis without current pathological fracture Osteoporosis on Fosamax.  Per patient, up-to-date with bone density, will obtain report.  Vitamin D supplements, calcium intake of 1200 to 1500 mg daily and weightbearing physical activities recommended.  4. Urinary urgency Urinary urgency, with some incontinence.  Referred to urology.  Avoid irritants to the bladder such as caffeine products.  Recommend good hydration with water.  5. Class 1 obesity due to excess calories with serious comorbidity and body mass index (BMI) of 32.0 to 32.9 in adult Recommend lower calorie/carb diet such as [employer].  Aerobic physical activities 5 times a week and weightlifting every 2 days.  Other orders - atorvastatin (LIPITOR) 40 MG tablet; Take 40 mg by mouth daily. - alendronate (FOSAMAX) 70 MG tablet; Take 70 mg by mouth once a week. Take with a full glass of water on an empty stomach.  Tracyann, it was a pleasure seeing you today!

## 2018-12-07 ENCOUNTER — Telehealth: Payer: Self-pay | Admitting: *Deleted

## 2018-12-07 NOTE — Telephone Encounter (Signed)
-----   Message from Princess Bruins, MD sent at 12/02/2018 10:53 AM EDT ----- Regarding: Refer to Urology Urinary Urgency with some incontinence.

## 2018-12-07 NOTE — Telephone Encounter (Signed)
Office notes faxed/ referral placed in [place] to [facility] they will call patient call to schedule. # given to patient as well.

## 2018-12-11 ENCOUNTER — Ambulatory Visit (INDEPENDENT_AMBULATORY_CARE_PROVIDER_SITE_OTHER): Payer: Medicare Other

## 2018-12-11 ENCOUNTER — Other Ambulatory Visit: Payer: Self-pay

## 2018-12-11 ENCOUNTER — Ambulatory Visit (INDEPENDENT_AMBULATORY_CARE_PROVIDER_SITE_OTHER): Payer: Medicare Other | Admitting: Orthopaedic Surgery

## 2018-12-11 ENCOUNTER — Encounter: Payer: Self-pay | Admitting: Orthopaedic Surgery

## 2018-12-11 VITALS — Ht 65.0 in | Wt 194.0 lb

## 2018-12-11 DIAGNOSIS — Z96649 Presence of unspecified artificial hip joint: Secondary | ICD-10-CM

## 2018-12-11 DIAGNOSIS — M545 Low back pain, unspecified: Secondary | ICD-10-CM

## 2018-12-11 DIAGNOSIS — M978XXD Periprosthetic fracture around other internal prosthetic joint, subsequent encounter: Secondary | ICD-10-CM | POA: Diagnosis not present

## 2018-12-11 DIAGNOSIS — S22080A Wedge compression fracture of T11-T12 vertebra, initial encounter for closed fracture: Secondary | ICD-10-CM | POA: Diagnosis not present

## 2018-12-11 DIAGNOSIS — S22000A Wedge compression fracture of unspecified thoracic vertebra, initial encounter for closed fracture: Secondary | ICD-10-CM | POA: Insufficient documentation

## 2018-12-11 MED ORDER — HYDROCODONE-ACETAMINOPHEN 5-325 MG PO TABS: 1.0000 | ORAL_TABLET | Freq: Four times a day (QID) | ORAL | 0 refills | Status: DC | PRN

## 2018-12-11 MED ORDER — ALENDRONATE SODIUM 70 MG PO TABS: 70.0000 mg | ORAL_TABLET | ORAL | 4 refills | Status: DC

## 2018-12-11 NOTE — Progress Notes (Signed)
Office Visit Note   Patient: Emma Lewis           Date of Birth: 02-11-1951           MRN: [id number] Visit Date: 12/11/2018              Requested by: Philmore Pali, NP [address] [place], [place] [address] PCP: Philmore Pali, NP   Assessment & Plan: Visit Diagnoses:  1. Periprosthetic fracture of femur at tip of prosthesis, subsequent encounter   2. Acute bilateral low back pain, unspecified whether sciatica present     Plan: We will restart her Fosamax 70 mg weekly.  Norco sent in for pain.  She can use her walker as before.  Lumbosacral corset she continues to wear intermittently.  Follow-Up Instructions: Return in about 1 month (around 01/11/2019).   Orders:  Orders Placed This Encounter  Procedures  . XR Lumbar Spine 2-3 Views  . XR FEMUR MIN 2 VIEWS LEFT   Meds ordered this encounter  Medications  . HYDROcodone-acetaminophen (NORCO/VICODIN) 5-325 MG tablet    Sig: Take 1 tablet by mouth every 6 (six) hours as needed for moderate pain.    Dispense:  40 tablet    Refill:  0      Procedures: No procedures performed   Clinical Data: No additional findings.   Subjective: Chief Complaint  Patient presents with  . Left Leg - Follow-up  . Lower Back - Pain    HPI 68 year old female returns she states 2 weeks ago her back when it started having sharp pain.  Previous history of compression fractures with vertebroplasty is done at L5 L4 and L3.  She has been on calcium and vitamin D she states her hip is been doing fairly well and she been walking with a cane.  Pain is upper lumbar region somewhat radiates to the mid axillary line.  No associated bowel bladder symptoms she had been taking some of the leftover Norco for the pain that she is having.  Patient is concerned that she has a new compression fracture. Patient had been off her Fosamax with her hip revision surgeries and periprosthetic fractures. Review of Systems reviewed updated unchanged from previous  surgery.   Objective: Vital Signs: Ht 5\' 5"  (1.651 m)   Wt 194 lb (88 kg)   BMI 32.28 kg/m   Physical Exam Constitutional:      Appearance: She is well-developed.  HENT:     Head: Normocephalic.     Right Ear: External ear normal.     Left Ear: External ear normal.  Eyes:     Pupils: Pupils are equal, round, and reactive to light.  Neck:     Thyroid: No thyromegaly.     Trachea: No tracheal deviation.  Cardiovascular:     Rate and Rhythm: Normal rate.  Pulmonary:     Effort: Pulmonary effort is normal.  Abdominal:     Palpations: Abdomen is soft.  Skin:    General: Skin is warm and dry.  Neurological:     Mental Status: She is alert and oriented to person, place, and time.  Psychiatric:        Behavior: Behavior normal.     Ortho Exam hip incisions well-healed without drainage.  She has tenderness at the upper lumbar region.  Hip flexors are intact no pain with internal/external rotation gently left hip.  Specialty Comments:  No specialty comments available.  Imaging: No results found.   [place]  History: Patient Active Problem List   Diagnosis Date Noted  . Symptomatic anemia   . Ambulatory dysfunction 07/21/2018  . Weakness of both legs 07/21/2018  . Proteus mirabilis infection   . Hip hematoma, left 06/19/2018  . Hematoma of left hip 06/19/2018  . Hip dislocation, left ([place]) 05/29/2018  . Peri-prosthetic femur fracture at tip of prosthesis 05/29/2018  . Prediabetes   . Popping sound of knee joint   . Hypoalbuminemia due to protein-calorie malnutrition ([place])   . Transaminitis   . Leukocytosis   . Postoperative pain   . 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 Acute blood loss anemia   . Leukemoid reaction   . 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 Joint effusion of pelvis or thigh, left 04/22/2018  . Trochanteric bursitis, left hip 04/22/2018  . History of left hip replacement 11/12/2017  . Bilateral primary osteoarthritis  of knee 11/12/2017  . GERD (gastroesophageal reflux disease) 01/17/2017  . Hypothyroidism 01/17/2017  . Vertigo 01/16/2017  . Nausea and vomiting 01/16/2017  . Essential hypertension 01/16/2017  . Fatty liver 01/16/2017  . Failed total hip arthroplasty ([place]) 06/10/2016  . Pelvic mass 06/15/2015  . Closed wedge compression fracture of fifth lumbar vertebra ([place])   . Cervical spondylosis 02/09/2014  . DDD (degenerative disc disease), lumbar 05/13/2013  . OA (osteoarthritis) 05/13/2013   Past Medical History:  Diagnosis Date  . Allergies   . Arthritis    "all over my body" (04/29/2018)  . Chronic lower back pain   . Fatty liver   . Fever blister    Takes Acyclovir  . GERD (gastroesophageal reflux disease)   . Hyperlipemia   . Hypothyroidism   . Migraines   . Osteopenia   . Pneumonia    "once; years ago" (04/29/2018)  . PONV (postoperative nausea and vomiting)    "dry heaves"  . Pre-diabetes   . Seasonal allergies   . Vertigo     Family History  Problem Relation Age of Onset  . Lymphoma Mother     Past Surgical History:  Procedure Laterality Date  . ANTERIOR CERVICAL DECOMP/DISCECTOMY FUSION N/A 02/09/2014   Procedure: ANTERIOR CERVICAL DECOMPRESSION/DISCECTOMY FUSION 2 LEVELS;  Surgeon: [name], MD;  Location: [facility];  Service: Orthopedics;  Laterality: N/A;  C4-5, C5-6 Anterior Cervical Discectomy and Fusion, Allograft, Plate  . BACK SURGERY    . CARPAL TUNNEL RELEASE Right 2018  . CATARACT EXTRACTION W/ INTRAOCULAR LENS  IMPLANT, BILATERAL    . COLONOSCOPY    . FIXATION KYPHOPLASTY LUMBAR SPINE  X 2  . HIP ARTHROPLASTY Left 2011  . INCISION AND DRAINAGE HIP Left 06/19/2018   Procedure: IRRIGATION AND DEBRIDEMENT LEFT HIP HEMATOMA ANTIBIOTIC BEADS, APPLICATION ON WOUND VAC;  Surgeon: [name], MD;  Location: [facility];  Service: Orthopedics;  Laterality: Left;  . JOINT REPLACEMENT    . KNEE ARTHROSCOPY Right   . ORIF PERIPROSTHETIC FRACTURE Left 06/01/2018    Procedure: OPEN REDUCTION INTERNAL FIXATION (ORIF) PERIPROSTHETIC FEMUR FRACTURE;  Surgeon: [name], MD;  Location: [facility];  Service: Orthopedics;  Laterality: Left;  . PATELLA FRACTURE SURGERY Right ~ 1990   "put metal in"  . PATELLA HARDWARE REMOVAL Right    "took the metal out"  . REVISION TOTAL HIP ARTHROPLASTY Left 04/29/2018  . ROBOTIC ASSISTED BILATERAL SALPINGO OOPHERECTOMY Bilateral 08/01/2015   Procedure: ROBOTIC ASSISTED BILATERAL SALPINGO OOPHORECTOMY;  Surgeon: Everitt Amber, MD;  Location: [facility];  Service: Gynecology;  Laterality: Bilateral;  . SHOULDER OPEN ROTATOR CUFF REPAIR Right    3 TOTAL  . SHOULDER SURGERY Right   . TOTAL HIP ARTHROPLASTY Right 01/21/2018   Procedure: RIGHT TOTAL HIP ARTHROPLASTY DIRECT ANTERIOR;  Surgeon: [name], MD;  Location: [facility];  Service: Orthopedics;  Laterality: Right;  . TOTAL HIP REVISION Left 06/10/2016   Procedure: TOTAL HIP REVISION ARTHROPLASTY;  Surgeon: Rod Can, MD;  Location: [facility];  Service: Orthopedics;  Laterality: Left;  . TOTAL HIP REVISION Left 04/29/2018   Procedure: left total hip arthroplasty revision posterior approach;  Surgeon: [name], MD;  Location: [facility];  Service: Orthopedics;  Laterality: Left;  . TOTAL HIP REVISION Left 09/21/2018   Procedure: left total hip arthroplasty femoral revision;  Surgeon: [name], MD;  Location: [facility];  Service: Orthopedics;  Laterality: Left;  . WRIST GANGLION EXCISION Left 1970s   Social History   Occupational History  . Not on file  Tobacco Use  . Smoking status: Never Smoker  . Smokeless tobacco: Never Used  Substance and Sexual Activity  . Alcohol use: Not Currently    Comment: `  . Drug use: Never  . Sexual activity: Not Currently

## 2018-12-31 NOTE — Telephone Encounter (Signed)
Patient scheduled on 01/01/19 with Dr.MacDiarmid

## 2019-01-01 DIAGNOSIS — N3944 Nocturnal enuresis: Secondary | ICD-10-CM | POA: Diagnosis not present

## 2019-01-01 DIAGNOSIS — N3946 Mixed incontinence: Secondary | ICD-10-CM | POA: Diagnosis not present

## 2019-01-01 DIAGNOSIS — R350 Frequency of micturition: Secondary | ICD-10-CM | POA: Diagnosis not present

## 2019-01-01 DIAGNOSIS — R351 Nocturia: Secondary | ICD-10-CM | POA: Diagnosis not present

## 2019-01-22 ENCOUNTER — Other Ambulatory Visit: Payer: Self-pay

## 2019-01-22 ENCOUNTER — Ambulatory Visit (INDEPENDENT_AMBULATORY_CARE_PROVIDER_SITE_OTHER): Payer: Medicare Other

## 2019-01-22 ENCOUNTER — Encounter: Payer: Self-pay | Admitting: Orthopaedic Surgery

## 2019-01-22 ENCOUNTER — Ambulatory Visit (INDEPENDENT_AMBULATORY_CARE_PROVIDER_SITE_OTHER): Payer: Medicare Other | Admitting: Orthopaedic Surgery

## 2019-01-22 VITALS — Ht 65.0 in

## 2019-01-22 DIAGNOSIS — M545 Low back pain, unspecified: Secondary | ICD-10-CM

## 2019-01-22 NOTE — Progress Notes (Signed)
Office Visit Note   Patient: Emma Lewis           Date of Birth: [birth year]           MRN: [id number] Visit Date: 01/22/2019              Requested by: Philmore Pali, NP [address] [place],  [place] [address] PCP: Philmore Pali, NP   Assessment & Plan: Visit Diagnoses:  1. Acute bilateral low back pain, unspecified whether sciatica present     Plan: Patient states she did not want to go to formal physical therapy I gave her multiple exercises to work on to strengthen her quad including using a step at her house wall squats using a purse her book bag on her ankle with knee extension work straight leg raising with weights on the ankle etc.  I will recheck her in 3 months.  She will work hard on her quad strength.  Follow-Up Instructions: No follow-ups on file.   Orders:  Orders Placed This Encounter  Procedures  . XR Lumbar Spine 2-3 Views   No orders of the defined types were placed in this encounter.     Procedures: No procedures performed   Clinical Data: No additional findings.   Subjective: Chief Complaint  Patient presents with  . Lower Back - Pain, Follow-up  . 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  Primary problem is been ambulation with a single crutch she has been using the back brace which helps her back pain and is concerned she might have additional compression fractures today and x-rays were obtained.  Review of Systems reviewed updated unchanged she is off her antibiotics.  Opposite right total hip arthroplasty doing well.   Objective: Vital Signs: Ht 5\' 5"  (1.651 m)   BMI 32.28 kg/m   Physical Exam Constitutional:      Appearance: She is well-developed.  HENT:   Head: Normocephalic.     Right Ear: External ear normal.     Left Ear: External ear normal.  Eyes:     Pupils: Pupils are equal, round, and reactive to light.  Neck:     Thyroid: No thyromegaly.     Trachea: No tracheal deviation.  Cardiovascular:     Rate and Rhythm: Normal rate.  Pulmonary:     Effort: Pulmonary effort is normal.  Abdominal:     Palpations: Abdomen is soft.  Skin:    General: Skin is warm and dry.  Neurological:     Mental Status: She is alert and oriented to person, place, and time.  Psychiatric:        Behavior: Behavior normal.     Ortho Exam patient has the gait without her single crutch short stride repeat quad gait.  I can overcome her left quad with hand pressure at the ankle.  Leg lengths are within 1/4 inches symmetrical.  Hip incisions well-healed.  Specialty Comments:  No specialty comments available.  Imaging: No results found.   PMFS History: Patient Active Problem List   Diagnosis Date Noted  . Compression fracture of T12 vertebra ([place]) 12/11/2018  . Symptomatic anemia   . Ambulatory dysfunction 07/21/2018  . Weakness of  both legs 07/21/2018  . Proteus mirabilis infection   . Hip hematoma, left 06/19/2018  . Hematoma of left hip 06/19/2018  . Hip dislocation, left ([place]) 05/29/2018  . Peri-prosthetic femur fracture at tip of prosthesis 05/29/2018  . Prediabetes   . Popping sound of knee joint   . Hypoalbuminemia due to protein-calorie malnutrition ([place])   . Transaminitis   . Leukocytosis   . Postoperative pain   . 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 Acute blood loss anemia   . Leukemoid reaction   . 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 Joint effusion of pelvis or thigh, left 04/22/2018  . Trochanteric bursitis, left hip 04/22/2018  . History of left hip replacement 11/12/2017  . Bilateral primary osteoarthritis of knee 11/12/2017  . GERD (gastroesophageal reflux disease) 01/17/2017  .  Hypothyroidism 01/17/2017  . Vertigo 01/16/2017  . Nausea and vomiting 01/16/2017  . Essential hypertension 01/16/2017  . Fatty liver 01/16/2017  . Failed total hip arthroplasty ([place]) 06/10/2016  . Pelvic mass 06/15/2015  . Closed wedge compression fracture of fifth lumbar vertebra ([place])   . Cervical spondylosis 02/09/2014  . DDD (degenerative disc disease), lumbar 05/13/2013  . OA (osteoarthritis) 05/13/2013   Past Medical History:  Diagnosis Date  . Allergies   . Arthritis    "all over my body" (04/29/2018)  . Chronic lower back pain   . Fatty liver   . Fever blister    Takes Acyclovir  . GERD (gastroesophageal reflux disease)   . Hyperlipemia   . Hypothyroidism   . Migraines   . Osteopenia   . Pneumonia    "once; years ago" (04/29/2018)  . PONV (postoperative nausea and vomiting)    "dry heaves"  . Pre-diabetes   . Seasonal allergies   . Vertigo     Family History  Problem Relation Age of Onset  . Lymphoma Mother     Past Surgical History:  Procedure Laterality Date  . ANTERIOR CERVICAL DECOMP/DISCECTOMY FUSION N/A 02/09/2014   Procedure: ANTERIOR CERVICAL DECOMPRESSION/DISCECTOMY FUSION 2 LEVELS;  Surgeon: [name], MD;  Location: [facility];  Service: Orthopedics;  Laterality: N/A;  C4-5, C5-6 Anterior Cervical Discectomy and Fusion, Allograft, Plate  . BACK SURGERY    . CARPAL TUNNEL RELEASE Right 2018  . CATARACT EXTRACTION W/ INTRAOCULAR LENS  IMPLANT, BILATERAL    . COLONOSCOPY    . FIXATION KYPHOPLASTY LUMBAR SPINE  X 2  . HIP ARTHROPLASTY Left 2011  . INCISION AND DRAINAGE HIP Left 06/19/2018   Procedure: IRRIGATION AND DEBRIDEMENT LEFT HIP HEMATOMA ANTIBIOTIC BEADS, APPLICATION ON WOUND VAC;  Surgeon: [name], MD;  Location: [facility];  Service: Orthopedics;  Laterality: Left;  . JOINT REPLACEMENT    . KNEE ARTHROSCOPY Right   . ORIF PERIPROSTHETIC FRACTURE Left 06/01/2018   Procedure: OPEN REDUCTION INTERNAL FIXATION (ORIF) PERIPROSTHETIC FEMUR FRACTURE;   Surgeon: [name], MD;  Location: [facility];  Service: Orthopedics;  Laterality: Left;  . PATELLA FRACTURE SURGERY Right ~ 1990   "put metal in"  . PATELLA HARDWARE REMOVAL Right    "took the metal out"  . REVISION TOTAL HIP ARTHROPLASTY Left 04/29/2018  . ROBOTIC ASSISTED BILATERAL SALPINGO OOPHERECTOMY Bilateral 08/01/2015   Procedure: ROBOTIC ASSISTED BILATERAL SALPINGO OOPHORECTOMY;  Surgeon: Everitt Amber, MD;  [facility];  Service: Gynecology;  Laterality: Bilateral;  . SHOULDER OPEN ROTATOR CUFF REPAIR Right    3 TOTAL  . SHOULDER SURGERY Right   .  TOTAL HIP ARTHROPLASTY Right 01/21/2018   Procedure: RIGHT TOTAL HIP ARTHROPLASTY DIRECT ANTERIOR;  Surgeon: [name], MD;  Location: [facility];  Service: Orthopedics;  Laterality: Right;  . TOTAL HIP REVISION Left 06/10/2016   Procedure: TOTAL HIP REVISION ARTHROPLASTY;  Surgeon: Rod Can, MD;  Location: [facility];  Service: Orthopedics;  Laterality: Left;  . TOTAL HIP REVISION Left 04/29/2018   Procedure: left total hip arthroplasty revision posterior approach;  Surgeon: [name], MD;  Location: [facility];  Service: Orthopedics;  Laterality: Left;  . TOTAL HIP REVISION Left 09/21/2018   Procedure: left total hip arthroplasty femoral revision;  Surgeon: [name], MD;  Location: [facility];  Service: Orthopedics;  Laterality: Left;  . WRIST GANGLION EXCISION Left 1970s   Social History   Occupational History  . Not on file  Tobacco Use  . Smoking status: Never Smoker  . Smokeless tobacco: Never Used  Substance and Sexual Activity  . Alcohol use: Not Currently    Comment: `  . Drug use: Never  . Sexual activity: Not Currently

## 2019-02-05 DIAGNOSIS — N3946 Mixed incontinence: Secondary | ICD-10-CM | POA: Diagnosis not present

## 2019-02-05 DIAGNOSIS — R350 Frequency of micturition: Secondary | ICD-10-CM | POA: Diagnosis not present

## 2019-02-19 IMAGING — DX DG HIP (WITH OR WITHOUT PELVIS) 1V PORT*R*
2 series · 2 of 2 positions shown · non-contrast
Comparison: Prior study from earlier the same day.

CLINICAL DATA: Follow-up examination status post right total hip
arthroplasty.

EXAM:
DG HIP (WITH OR WITHOUT PELVIS) 1V PORT RIGHT

[pelvis]
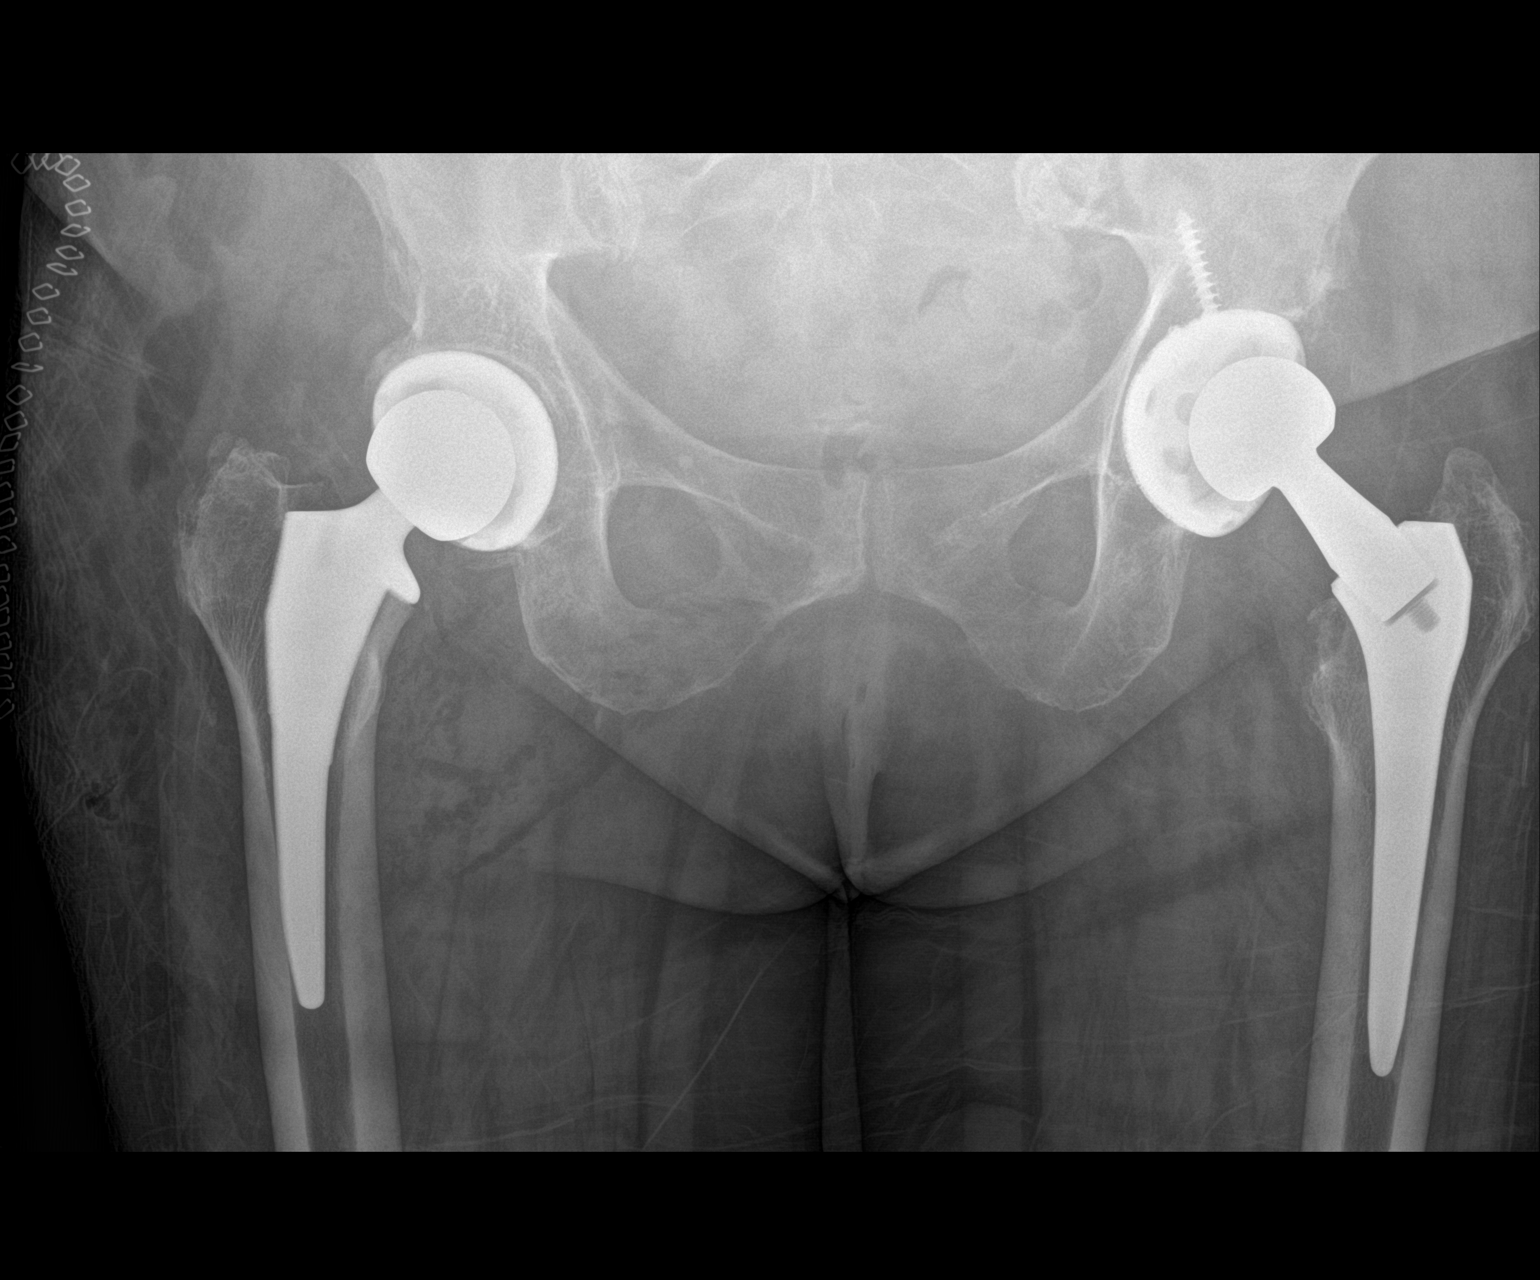

[hip]
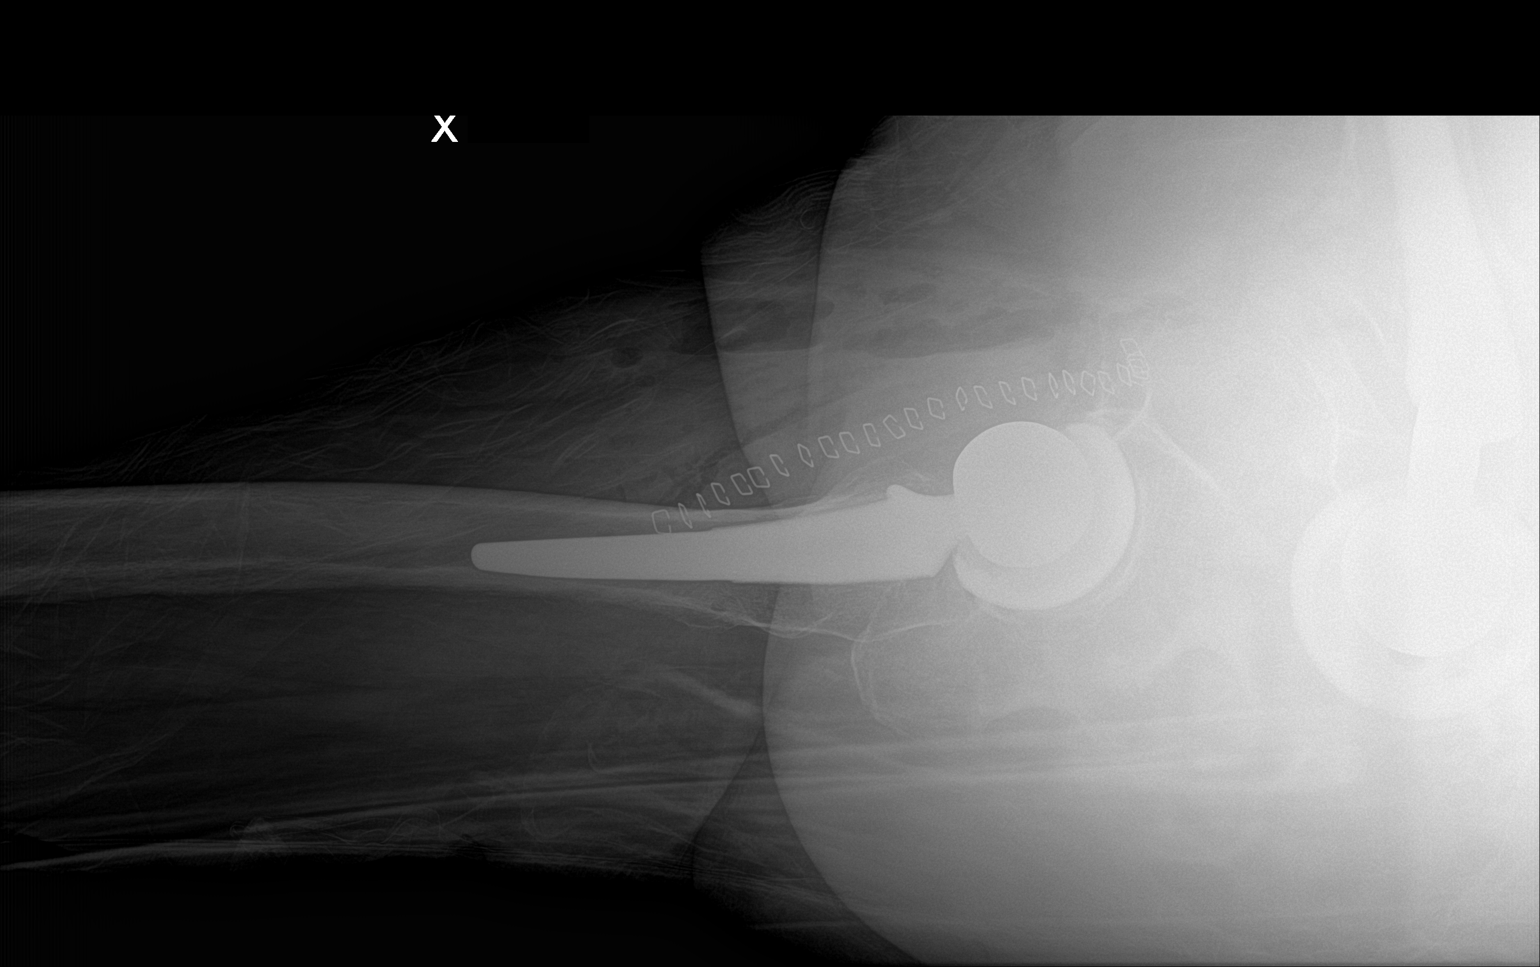

[2 of 2 positions shown; findings below may reference images not displayed]

FINDINGS: Right total hip arthroplasty in place. Acetabular and femoral
components appear well seated. No periprosthetic fracture or other
adverse feature. Postoperative soft tissue swelling with emphysema
overlies the right hip. Skin staples in place. Bony pelvis intact.
Left hip arthroplasty noted.
IMPRESSION: Sequelae of interval right total hip arthroplasty without adverse
features.

## 2019-03-29 ENCOUNTER — Telehealth: Payer: Self-pay | Admitting: Orthopaedic Surgery

## 2019-03-29 NOTE — Telephone Encounter (Signed)
Patient called asked if Emma Lewis know anything about Emma Lewis or ever heard of him? The number to contact patient is [phone]

## 2019-03-31 NOTE — Telephone Encounter (Signed)
I called , has appt Tuesday to come in and see [place] and get xrays. Previous compression Fx's times 4 and she says this feels just like it. She has walker and pain meds.

## 2019-03-31 NOTE — Telephone Encounter (Signed)
   I called patient. She states that three weeks ago she got up out of the chair and fractured her back again. She knew exactly what it was and has been in the back brace for three weeks. She is not getting any better. She states that she has to have something done and she knows that Dr. Lorin Mercy did not recommend the vertebroplasty when she first had it. She had a bad experience with Dr. Estanislado Pandy and his PA when she had the first one done. She stated that her husband found this physician online and she just wanted to know if I had heard anything about him. He is located in [place].  She has now had to go back to walker and back on pain medication. She is also back to sleeping in recliner. She is completely frustrated. She has appointment scheduled with Dr. Lorin Mercy on 04/06/2019 to be seen for her back. She would like to know if there is anything that she can do until she sees you?

## 2019-04-06 ENCOUNTER — Ambulatory Visit (INDEPENDENT_AMBULATORY_CARE_PROVIDER_SITE_OTHER): Payer: Medicare Other

## 2019-04-06 ENCOUNTER — Encounter: Payer: Self-pay | Admitting: Orthopaedic Surgery

## 2019-04-06 ENCOUNTER — Ambulatory Visit (INDEPENDENT_AMBULATORY_CARE_PROVIDER_SITE_OTHER): Payer: Medicare Other | Admitting: Orthopaedic Surgery

## 2019-04-06 VITALS — BP 142/81 | HR 80 | Ht 65.0 in

## 2019-04-06 DIAGNOSIS — M545 Low back pain, unspecified: Secondary | ICD-10-CM

## 2019-04-06 MED ORDER — HYDROCODONE-ACETAMINOPHEN 5-325 MG PO TABS: 1.0000 | ORAL_TABLET | Freq: Four times a day (QID) | ORAL | 0 refills | Status: DC | PRN

## 2019-04-06 NOTE — Progress Notes (Signed)
Office Visit Note   Patient: Emma Lewis           Date of Birth: [birth year]           MRN: [id number] Visit Date: 04/06/2019              Requested by: Philmore Pali, NP [address] [place],  [place] [address] PCP: Philmore Pali, NP   Assessment & Plan: Visit Diagnoses:  1. Acute midline low back pain, unspecified whether sciatica present     Plan: New T11-T12 compression fractures.  She is already on calcium taking Fosamax.  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.  She is amatory with a cane using her corset Vicodin 30 tablets prescribed.  Recheck 2 months repeat lumbar x-rays AP lateral on return.  She will avoid bending twisting and stooping.  I discussed with her that this should heal and she should get significant improvement in her pain with time.  She will call if she needs more pain medication refill of the Norco but she is using it very sparingly.  Follow-Up Instructions: No follow-ups on file.   Orders:  Orders Placed This Encounter  Procedures   XR Lumbar Spine 2-3 Views   No orders of the defined types were placed in this encounter.     Procedures: No procedures performed   Clinical Data: No additional findings.   Subjective: Chief Complaint  Patient presents with   Lower Back - Pain    HPI 68 year old female returns she states she got out of a chair 3 weeks ago and suddenly had acute onset of thoracolumbar pain.  She has had previous vertebroplasty or kyphoplasty treatment at L3-L4 and L5.  Old T6 compression fracture noted on 2019 MRI scan thoracic.  X-rays taken today shows new T12 50% compression fracture and 60% T12 compression fracture.  Previously on MRI 2019 she did have a superior endplate fracture 624THL without compression.  Patient is on Fosamax weekly.  She has had multiple hip procedures including  periprosthetic fractures and was minimally ambulatory for extended period of time.  She has been using a walker also using a brace for her back.  Review of Systems reviewed updated and noncontributory.  See previous office note.   Objective: Vital Signs: BP (!) 142/81    Pulse 80    Ht 5\' 5"  (1.651 m)    BMI 32.28 kg/m   Physical Exam Constitutional:      Appearance: She is well-developed.  HENT:     Head: Normocephalic.     Right Ear: External ear normal.     Left Ear: External ear normal.  Eyes:     Pupils: Pupils are equal, round, and reactive to light.  Neck:     Thyroid: No thyromegaly.     Trachea: No tracheal deviation.  Cardiovascular:     Rate and Rhythm: Normal rate.  Pulmonary:     Effort: Pulmonary effort is normal.  Abdominal:     Palpations: Abdomen is soft.  Skin:    General: Skin is warm and dry.  Neurological:     Mental Status: She is alert and oriented to person, place, and time.  Psychiatric:        Behavior: Behavior normal.     Ortho Exam  Specialty Comments:  No specialty comments available.  Imaging: No results  found.   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 ([place]) 05/01/2018   Acute blood loss anemia    Leukemoid reaction    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   Joint effusion of pelvis or thigh, left 04/22/2018    Trochanteric bursitis, left hip 04/22/2018   History of left hip replacement 11/12/2017   Bilateral primary osteoarthritis of knee 11/12/2017   GERD (gastroesophageal reflux disease) 01/17/2017   Hypothyroidism 01/17/2017   Vertigo 01/16/2017   Nausea and vomiting 01/16/2017   Essential hypertension 01/16/2017   Fatty liver 01/16/2017   Failed total hip arthroplasty ([place]) 06/10/2016   Pelvic mass 06/15/2015   Closed wedge compression fracture of fifth lumbar vertebra (HCC)    Cervical spondylosis 02/09/2014   DDD (degenerative disc disease), lumbar 05/13/2013   OA (osteoarthritis) 05/13/2013   Past Medical History:  Diagnosis Date   Allergies    Arthritis    "all over my body" (04/29/2018)   Chronic lower back pain    Fatty liver    Fever blister    Takes Acyclovir   GERD (gastroesophageal reflux disease)    Hyperlipemia    Hypothyroidism    Migraines    Osteopenia    Pneumonia    "once; years ago" (04/29/2018)   PONV (postoperative nausea and vomiting)    "dry heaves"   Pre-diabetes    Seasonal allergies    Vertigo     Family History  Problem Relation Age of Onset   Lymphoma Mother     Past Surgical History:  Procedure Laterality Date   ANTERIOR CERVICAL DECOMP/DISCECTOMY FUSION N/A 02/09/2014   Procedure: ANTERIOR CERVICAL DECOMPRESSION/DISCECTOMY FUSION 2 LEVELS;  Surgeon: [name], MD;  Location: [facility];  Service: Orthopedics;  Laterality: N/A;  C4-5, C5-6 Anterior Cervical Discectomy and Fusion, Allograft, Plate   BACK SURGERY     CARPAL TUNNEL RELEASE Right 2018   CATARACT EXTRACTION W/ INTRAOCULAR LENS  IMPLANT, BILATERAL     COLONOSCOPY     FIXATION KYPHOPLASTY LUMBAR SPINE  X 2   HIP ARTHROPLASTY Left 2011   INCISION AND DRAINAGE HIP Left 06/19/2018   Procedure: IRRIGATION AND DEBRIDEMENT LEFT HIP HEMATOMA ANTIBIOTIC BEADS, APPLICATION ON WOUND VAC;  Surgeon: [name], MD;  Location: [facility];  Service: Orthopedics;   Laterality: Left;   JOINT REPLACEMENT     KNEE ARTHROSCOPY Right    ORIF PERIPROSTHETIC FRACTURE Left 06/01/2018   Procedure: OPEN REDUCTION INTERNAL FIXATION (ORIF) PERIPROSTHETIC FEMUR FRACTURE;  Surgeon: [name], MD;  Location: [facility];  Service: Orthopedics;  Laterality: Left;   PATELLA FRACTURE SURGERY Right ~ 1990   "put metal in"   PATELLA HARDWARE REMOVAL Right    "took the metal out"   [place] Left 04/29/2018   ROBOTIC ASSISTED BILATERAL SALPINGO OOPHERECTOMY Bilateral 08/01/2015   Procedure: ROBOTIC ASSISTED BILATERAL SALPINGO  OOPHORECTOMY;  Surgeon: Everitt Amber, MD;  Location: [facility];  Service: Gynecology;  Laterality: Bilateral;   SHOULDER OPEN ROTATOR CUFF REPAIR Right    3 TOTAL   SHOULDER SURGERY Right    TOTAL HIP ARTHROPLASTY Right 01/21/2018   Procedure: RIGHT TOTAL HIP ARTHROPLASTY DIRECT ANTERIOR;  Surgeon: [name], MD;  Location: [facility];  Service: Orthopedics;  Laterality: Right;   TOTAL HIP REVISION Left 06/10/2016   Procedure: TOTAL HIP REVISION ARTHROPLASTY;  Surgeon: Rod Can, MD;  Location: [facility];  Service: Orthopedics;  Laterality: Left;   TOTAL HIP REVISION Left 04/29/2018   Procedure: left total hip arthroplasty revision posterior approach;  Surgeon: [name], MD;  Location: [facility];  Service: Orthopedics;  Laterality: Left;   TOTAL HIP REVISION Left 09/21/2018   Procedure: left total hip arthroplasty femoral revision;  Surgeon: [name], MD;  Location: [facility];  Service: Orthopedics;  Laterality: Left;   WRIST GANGLION EXCISION Left 1970s   Social History   Occupational History   Not on file  Tobacco Use   Smoking status: Never Smoker   Smokeless tobacco: Never Used  Substance and Sexual Activity   Alcohol use: Not Currently    Comment: `   Drug use: Never   Sexual activity: Not Currently

## 2019-04-20 ENCOUNTER — Ambulatory Visit: Payer: Medicare Other | Admitting: Orthopaedic Surgery

## 2019-05-04 DIAGNOSIS — R7303 Prediabetes: Secondary | ICD-10-CM | POA: Diagnosis not present

## 2019-05-04 DIAGNOSIS — I10 Essential (primary) hypertension: Secondary | ICD-10-CM | POA: Diagnosis not present

## 2019-05-04 DIAGNOSIS — E669 Obesity, unspecified: Secondary | ICD-10-CM | POA: Diagnosis not present

## 2019-05-04 DIAGNOSIS — E785 Hyperlipidemia, unspecified: Secondary | ICD-10-CM | POA: Diagnosis not present

## 2019-05-04 DIAGNOSIS — E039 Hypothyroidism, unspecified: Secondary | ICD-10-CM | POA: Diagnosis not present

## 2019-05-04 DIAGNOSIS — E559 Vitamin D deficiency, unspecified: Secondary | ICD-10-CM | POA: Diagnosis not present

## 2019-05-04 DIAGNOSIS — D649 Anemia, unspecified: Secondary | ICD-10-CM | POA: Diagnosis not present

## 2019-05-13 DIAGNOSIS — H02831 Dermatochalasis of right upper eyelid: Secondary | ICD-10-CM | POA: Diagnosis not present

## 2019-05-13 DIAGNOSIS — H26492 Other secondary cataract, left eye: Secondary | ICD-10-CM | POA: Diagnosis not present

## 2019-05-13 DIAGNOSIS — H04123 Dry eye syndrome of bilateral lacrimal glands: Secondary | ICD-10-CM | POA: Diagnosis not present

## 2019-06-05 IMAGING — DX DG KNEE COMPLETE 4+V*L*
4 series · 4 of 4 positions shown · non-contrast
Comparison: None.

CLINICAL DATA: Acute LEFT knee pain.  Recent LEFT hip replacement.

EXAM:
LEFT KNEE - COMPLETE 4+ VIEW

[t knee obl left (1 of 2)]
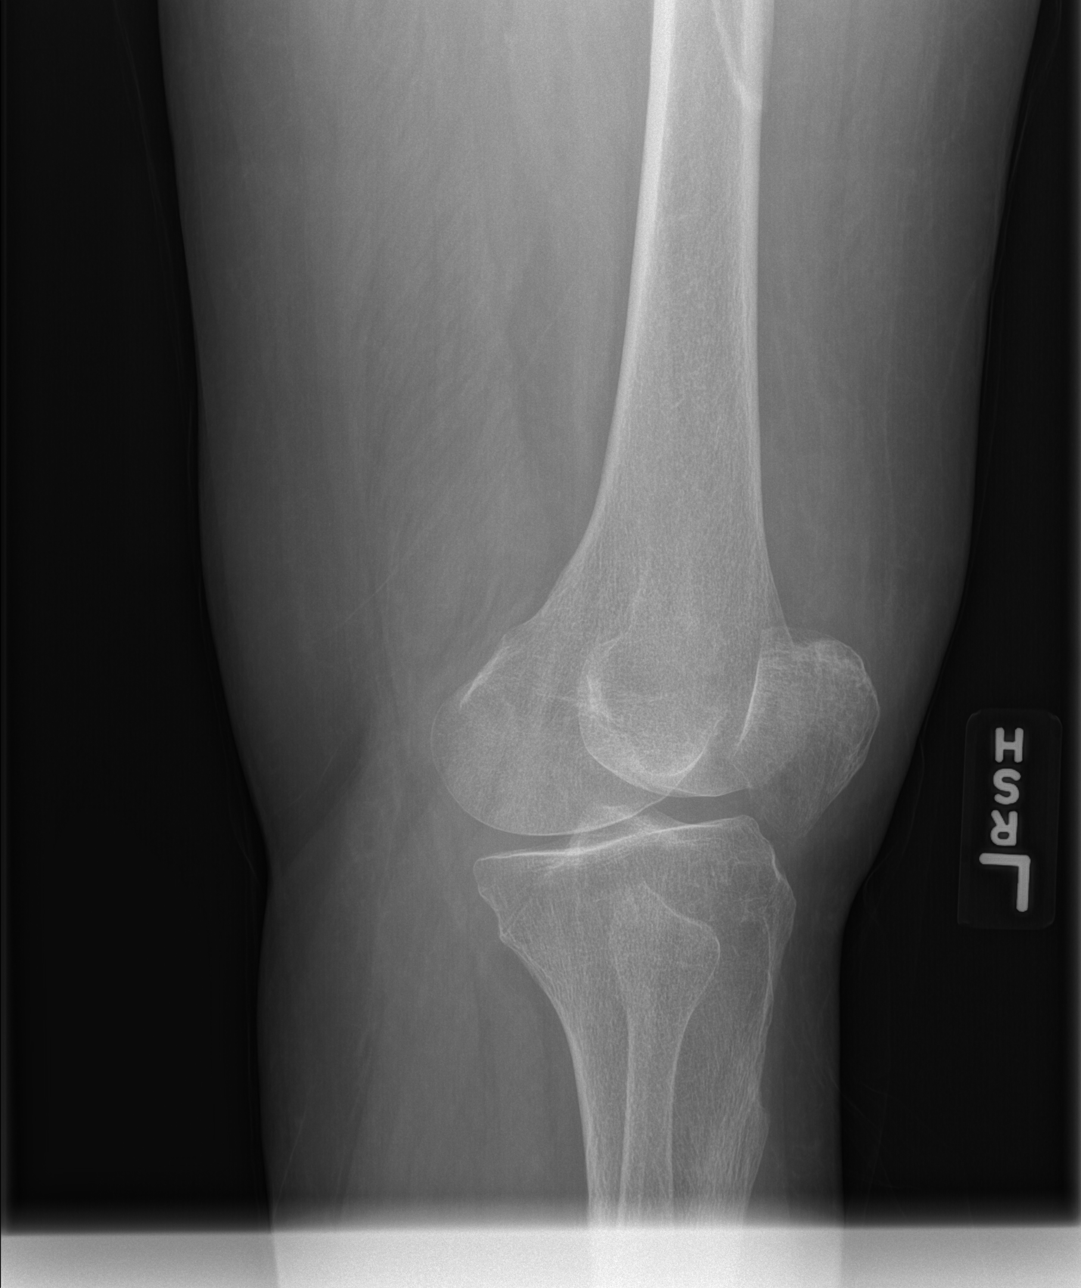

[t knee ap left]
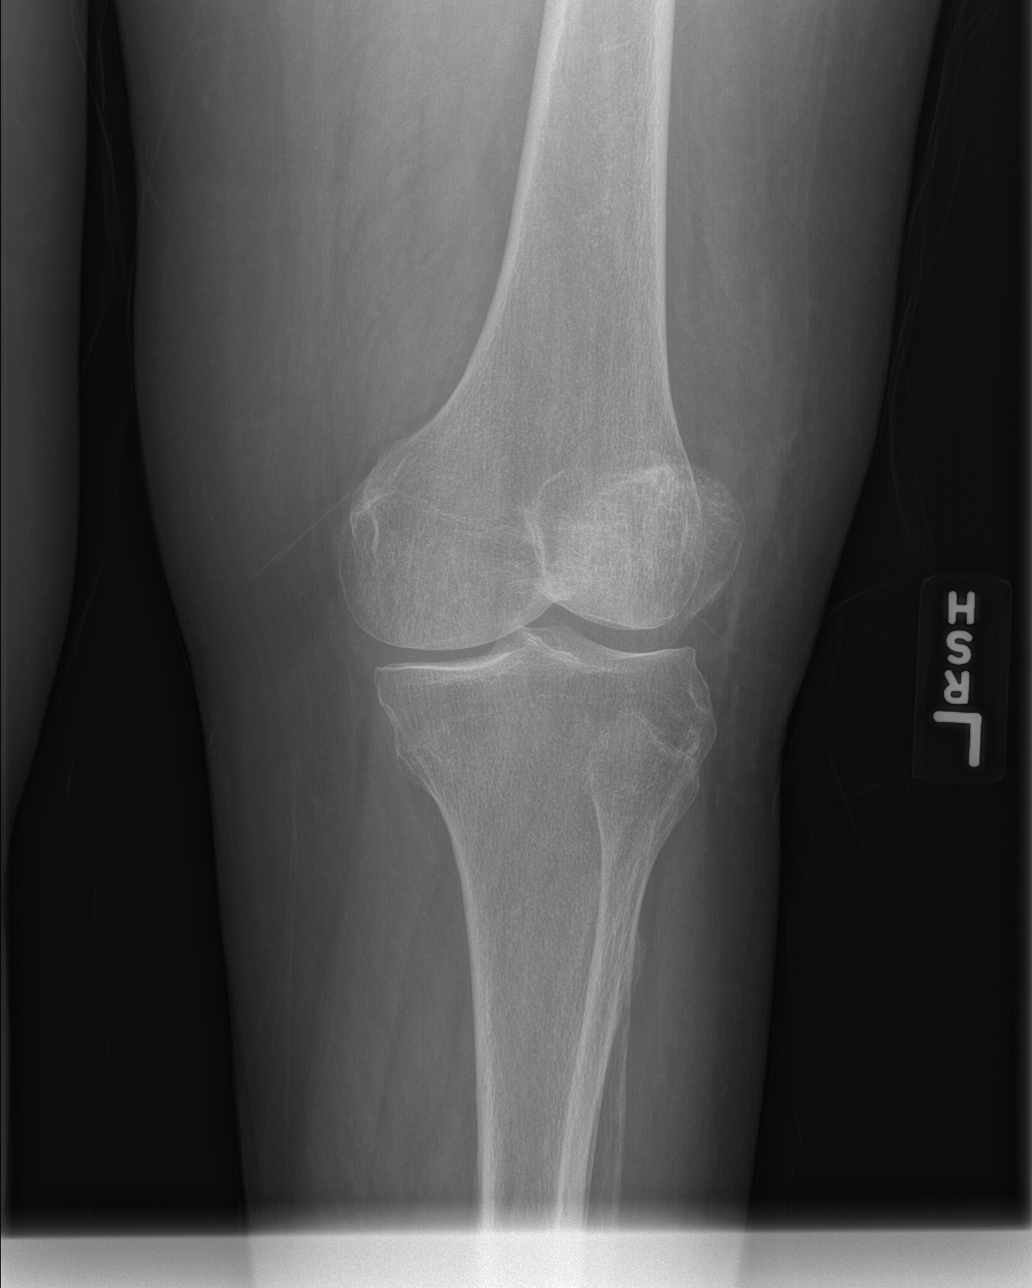

[t knee obl left (2 of 2)]
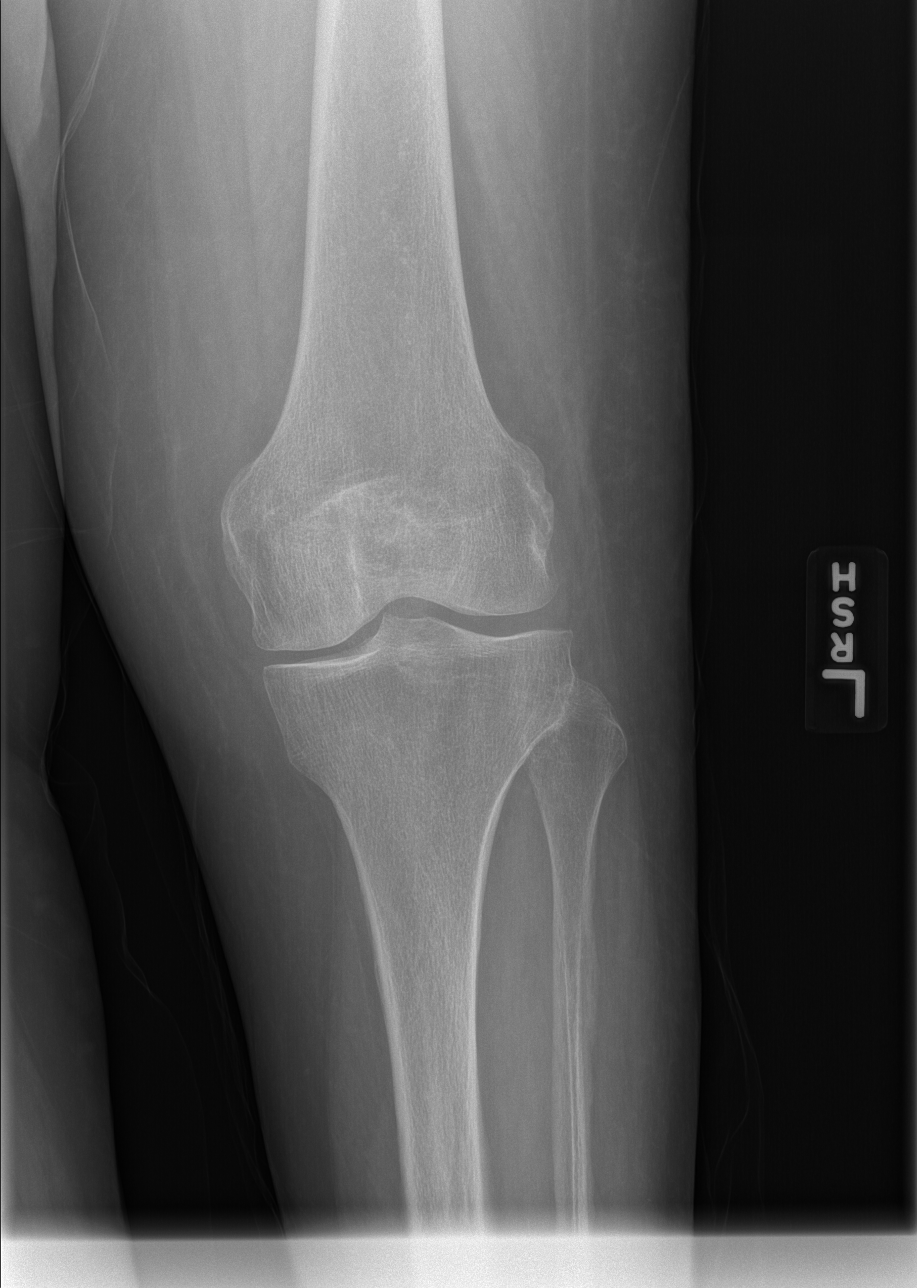

[x knee lat left]
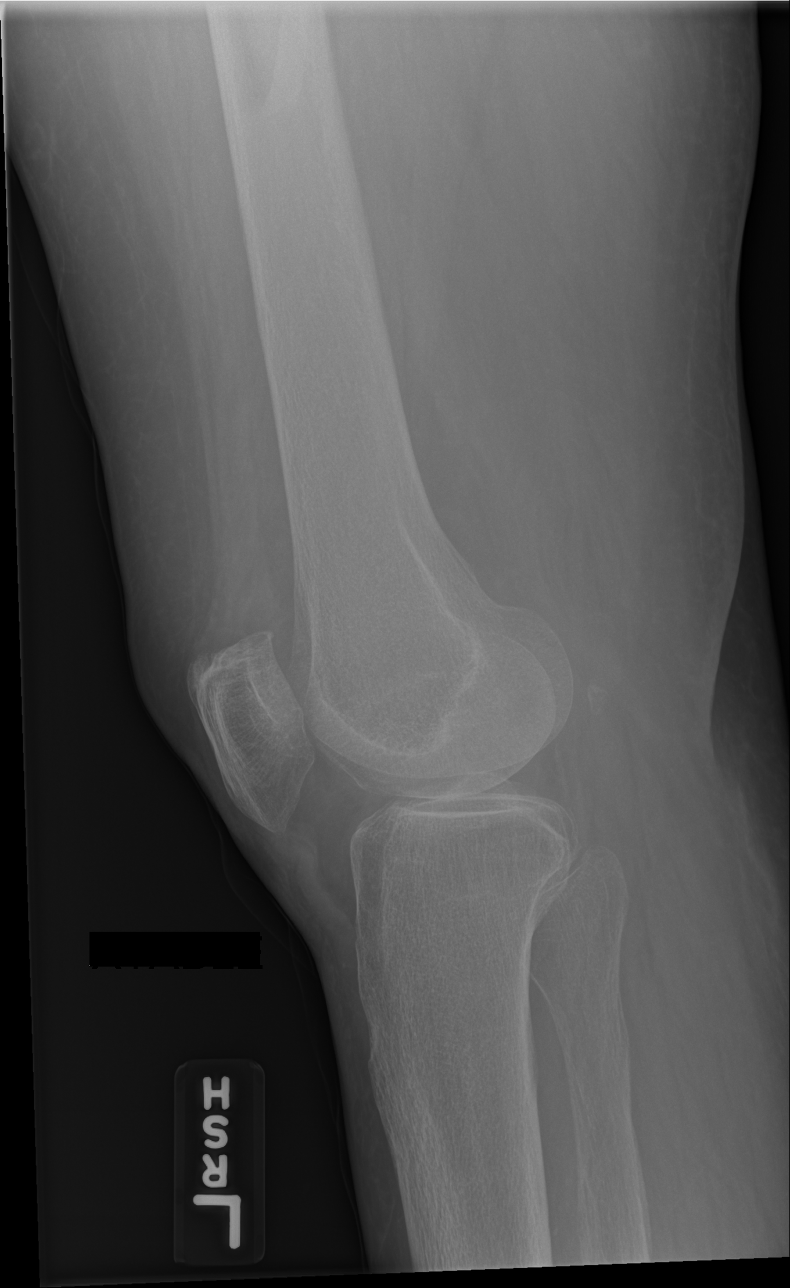

[4 of 4 positions shown; findings below may reference images not displayed]

FINDINGS: No acute fracture, subluxation or dislocation.

Mild joint space narrowing in the medial and patellofemoral
compartments noted.

No focal bony lesions noted.

No joint effusion.
IMPRESSION: Mild MEDIAL and patellofemoral compartment degenerative
changes/joint space narrowing without other significant abnormality.

## 2019-06-08 ENCOUNTER — Encounter: Payer: Self-pay | Admitting: Orthopaedic Surgery

## 2019-06-08 ENCOUNTER — Ambulatory Visit (INDEPENDENT_AMBULATORY_CARE_PROVIDER_SITE_OTHER): Payer: Medicare Other | Admitting: Orthopaedic Surgery

## 2019-06-08 ENCOUNTER — Other Ambulatory Visit: Payer: Self-pay

## 2019-06-08 ENCOUNTER — Ambulatory Visit (INDEPENDENT_AMBULATORY_CARE_PROVIDER_SITE_OTHER): Payer: Medicare Other

## 2019-06-08 VITALS — BP 138/77 | HR 80 | Ht 65.0 in | Wt 194.0 lb

## 2019-06-08 DIAGNOSIS — S22080A Wedge compression fracture of T11-T12 vertebra, initial encounter for closed fracture: Secondary | ICD-10-CM

## 2019-06-08 DIAGNOSIS — Z96642 Presence of left artificial hip joint: Secondary | ICD-10-CM

## 2019-06-08 DIAGNOSIS — M545 Low back pain, unspecified: Secondary | ICD-10-CM

## 2019-06-08 NOTE — Progress Notes (Signed)
Office Visit Note   Patient: Emma Lewis           Date of Birth: [birth year]           MRN: [id number] Visit Date: 06/08/2019              Requested by: Philmore Pali, NP [address] [place],  [place] [address] PCP: Philmore Pali, NP   Assessment & Plan: Visit Diagnoses:  1. Acute midline low back pain, unspecified whether sciatica present   2. History of left hip replacement   3. Compression fracture of T11 vertebra, initial encounter ([place])                     And T12.    Both healed.   Plan: Patient needs to continue her calcium continue her walking.  She recently was stopped from taking vitamin due to elevated vitamin D level.  She has a follow-up with her PCP and may need to restart it at a lower dose.  X-rays today showed thoracic compression fractures T11-T12 are healed.  She needs to continue to work on quad strength which will help her ambulate help with her back discomfort and decrease her falling risk.  She is no longer using her lumbar corset.  I can follow her up on an as-needed basis.  We discussed fall prevention.  Follow-Up Instructions: Return if symptoms worsen or fail to improve.   Orders:  Orders Placed This Encounter  Procedures  . XR Lumbar Spine 2-3 Views   No orders of the defined types were placed in this encounter.     Procedures: No procedures performed   Clinical Data: No additional findings.   Subjective: Chief Complaint  Patient presents with  . Lower Back - Follow-up, Fracture  . 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  Hip is now healed she still has quad weakness and had fall with T11-T12 compression fractures documented few months ago.  She states her back is doing better but she still has some weakness in her quad  and walks most the time using single-point cane in her opposite right hand due to her quad weakness.  She states she has not wanted to go to therapy since in the past she had an episode with periprosthetic fracture when she was participating with therapy.  When she first stands up she has some trouble medially ambulating has to stand for couple seconds and then begins to walk.  She has no fever no chills.  Patient did have problems with hip hematoma infected and was on antibiotics extensively with antibiotic beads absorbable.  No fever chills or drainage now for several months.  Review of Systems updated unchanged other than as mentioned in HPI versus previous office notes.   Objective: Vital Signs: BP 138/77   Pulse 80   Ht 5\' 5"  (1.651 m)   Wt 194 lb (88 kg)   BMI 32.28 kg/m   Physical Exam Constitutional:      Appearance: She is well-developed.  HENT:     Head: Normocephalic.     Right Ear: External ear normal.     Left Ear: External ear normal.  Eyes:     Pupils: Pupils are equal, round, and reactive to light.  Neck:  Thyroid: No thyromegaly.     Trachea: No tracheal deviation.  Cardiovascular:     Rate and Rhythm: Normal rate.  Pulmonary:     Effort: Pulmonary effort is normal.  Abdominal:     Palpations: Abdomen is soft.  Skin:    General: Skin is warm and dry.  Neurological:     Mental Status: She is alert and oriented to person, place, and time.  Psychiatric:        Behavior: Behavior normal.     Ortho Exam hip incisions well-healed no pain with internal/external rotation of her hip.  Minimal discomfort palpation of the thoracolumbar junction.  Ankle jerk anterior tib gastrocsoleus is strong.  She has normal hip adductor strength right left but has left quad weakness with still difficulty against arm pressure at her ankle in the sitting position doing a straight leg raise.  Specialty Comments:  No specialty comments available.  Imaging: No results found.    PMFS History: Patient Active Problem List   Diagnosis Date Noted  . Thoracic compression fracture ([place]) 12/11/2018  . Symptomatic anemia   . Ambulatory dysfunction 07/21/2018  . Weakness of both legs 07/21/2018  . Proteus mirabilis infection   . Hip hematoma, left 06/19/2018  . Hematoma of left hip 06/19/2018  . Hip dislocation, left ([place]) 05/29/2018  . Peri-prosthetic femur fracture at tip of prosthesis 05/29/2018  . Prediabetes   . Popping sound of knee joint   . Hypoalbuminemia due to protein-calorie malnutrition ([place])   . Transaminitis   . Leukocytosis   . Postoperative pain   . 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 Acute blood loss anemia   . Leukemoid reaction   . 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 Joint effusion of pelvis or thigh, left 04/22/2018  . Trochanteric bursitis, left hip 04/22/2018  . History of left hip replacement 11/12/2017  . Bilateral primary osteoarthritis of knee 11/12/2017  . GERD (gastroesophageal reflux disease) 01/17/2017  . Hypothyroidism 01/17/2017  . Vertigo 01/16/2017  . Nausea and vomiting 01/16/2017  . Essential hypertension 01/16/2017  . Fatty liver 01/16/2017  . Failed total hip arthroplasty ([place]) 06/10/2016  . Pelvic mass 06/15/2015  . Closed wedge compression fracture of fifth lumbar vertebra ([place])   . Cervical spondylosis 02/09/2014  . DDD (degenerative disc disease), lumbar 05/13/2013  . OA (osteoarthritis) 05/13/2013   Past Medical History:  Diagnosis Date  . Allergies   . Arthritis    "all over my body" (04/29/2018)  . Chronic lower back pain   . Fatty liver   . Fever blister    Takes Acyclovir  . GERD (gastroesophageal reflux disease)   . Hyperlipemia   . Hypothyroidism   . Migraines   . Osteopenia   . Pneumonia    "once; years ago" (04/29/2018)  . PONV (postoperative nausea and vomiting)    "dry heaves"  . Pre-diabetes   . Seasonal allergies   . Vertigo     Family History   Problem Relation Age of Onset  . Lymphoma Mother     Past Surgical History:  Procedure Laterality Date  . ANTERIOR CERVICAL DECOMP/DISCECTOMY FUSION N/A 02/09/2014   Procedure: ANTERIOR CERVICAL DECOMPRESSION/DISCECTOMY FUSION 2 LEVELS;  Surgeon: [name], MD;  Location: [facility];  Service: Orthopedics;  Laterality: N/A;  C4-5, C5-6 Anterior Cervical Discectomy and Fusion, Allograft, Plate  . BACK SURGERY    . CARPAL TUNNEL RELEASE Right 2018  . CATARACT EXTRACTION W/ INTRAOCULAR LENS  IMPLANT, BILATERAL    . COLONOSCOPY    . FIXATION KYPHOPLASTY LUMBAR SPINE  X 2  . HIP ARTHROPLASTY Left 2011  . INCISION AND DRAINAGE HIP Left 06/19/2018   Procedure: IRRIGATION AND DEBRIDEMENT LEFT HIP HEMATOMA ANTIBIOTIC BEADS, APPLICATION ON WOUND VAC;  Surgeon: [name], MD;  Location: [facility];  Service: Orthopedics;  Laterality: Left;  . JOINT REPLACEMENT    . KNEE ARTHROSCOPY Right   . ORIF PERIPROSTHETIC FRACTURE Left 06/01/2018   Procedure: OPEN REDUCTION INTERNAL FIXATION (ORIF) PERIPROSTHETIC FEMUR FRACTURE;  Surgeon: [name], MD;  Location: [facility];  Service: Orthopedics;  Laterality: Left;  . PATELLA FRACTURE SURGERY Right ~ 1990   "put metal in"  . PATELLA HARDWARE REMOVAL Right    "took the metal out"  . REVISION TOTAL HIP ARTHROPLASTY Left 04/29/2018  . ROBOTIC ASSISTED BILATERAL SALPINGO OOPHERECTOMY Bilateral 08/01/2015   Procedure: ROBOTIC ASSISTED BILATERAL SALPINGO OOPHORECTOMY;  Surgeon: Everitt Amber, MD;  Location: [facility];  Service: Gynecology;  Laterality: Bilateral;  . SHOULDER OPEN ROTATOR CUFF REPAIR Right    3 TOTAL  . SHOULDER SURGERY Right   . TOTAL HIP ARTHROPLASTY Right 01/21/2018   Procedure: RIGHT TOTAL HIP ARTHROPLASTY DIRECT ANTERIOR;  Surgeon: [name], MD;  Location: [facility];  Service: Orthopedics;  Laterality: Right;  . TOTAL HIP REVISION Left 06/10/2016   Procedure: TOTAL HIP REVISION ARTHROPLASTY;  Surgeon: Rod Can, MD;  Location: [facility];  Service:  Orthopedics;  Laterality: Left;  . TOTAL HIP REVISION Left 04/29/2018   Procedure: left total hip arthroplasty revision posterior approach;  Surgeon: [name], MD;  Location: [facility];  Service: Orthopedics;  Laterality: Left;  . TOTAL HIP REVISION Left 09/21/2018   Procedure: left total hip arthroplasty femoral revision;  Surgeon: [name], MD;  Location: [facility];  Service: Orthopedics;  Laterality: Left;  . WRIST GANGLION EXCISION Left 1970s   Social History   Occupational History  . Not on file  Tobacco Use  . Smoking status: Never Smoker  . Smokeless tobacco: Never Used  Substance and Sexual Activity  . Alcohol use: Not Currently    Comment: `  . Drug use: Never  . Sexual activity: Not Currently

## 2019-06-09 DIAGNOSIS — R351 Nocturia: Secondary | ICD-10-CM | POA: Diagnosis not present

## 2019-06-09 DIAGNOSIS — N3946 Mixed incontinence: Secondary | ICD-10-CM | POA: Diagnosis not present

## 2019-06-17 DIAGNOSIS — E039 Hypothyroidism, unspecified: Secondary | ICD-10-CM | POA: Diagnosis not present

## 2019-06-30 IMAGING — RF DG FEMUR 2+V*L*
1 series · 6 of 6 positions shown · non-contrast
Comparison: Left femur radiographs-05/29/2018; left hip
radiographs-05/29/2018; left knee radiographs-05/07/2018

CLINICAL DATA: ORIF of the left femur

EXAM:
DG C-ARM 61-120 MIN; LEFT FEMUR 2 VIEWS
FLUOROSCOPY TIME:  21 seconds

[Series 1: run · 6 of 6 slices shown]
[im 1/6]
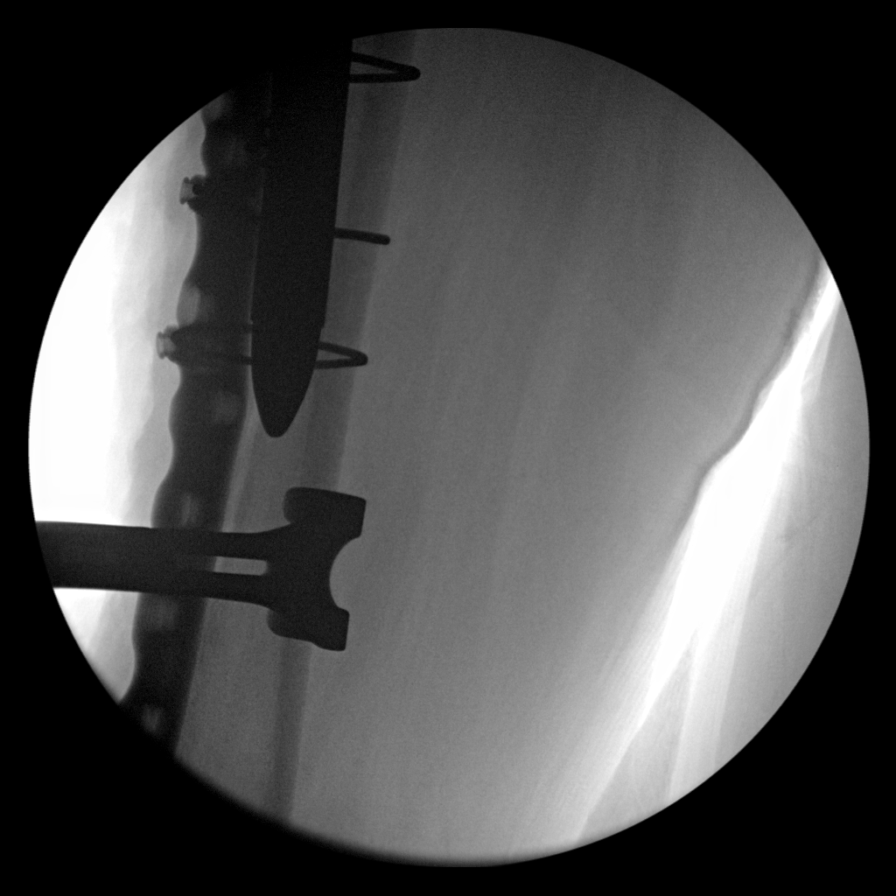
[im 2/6]
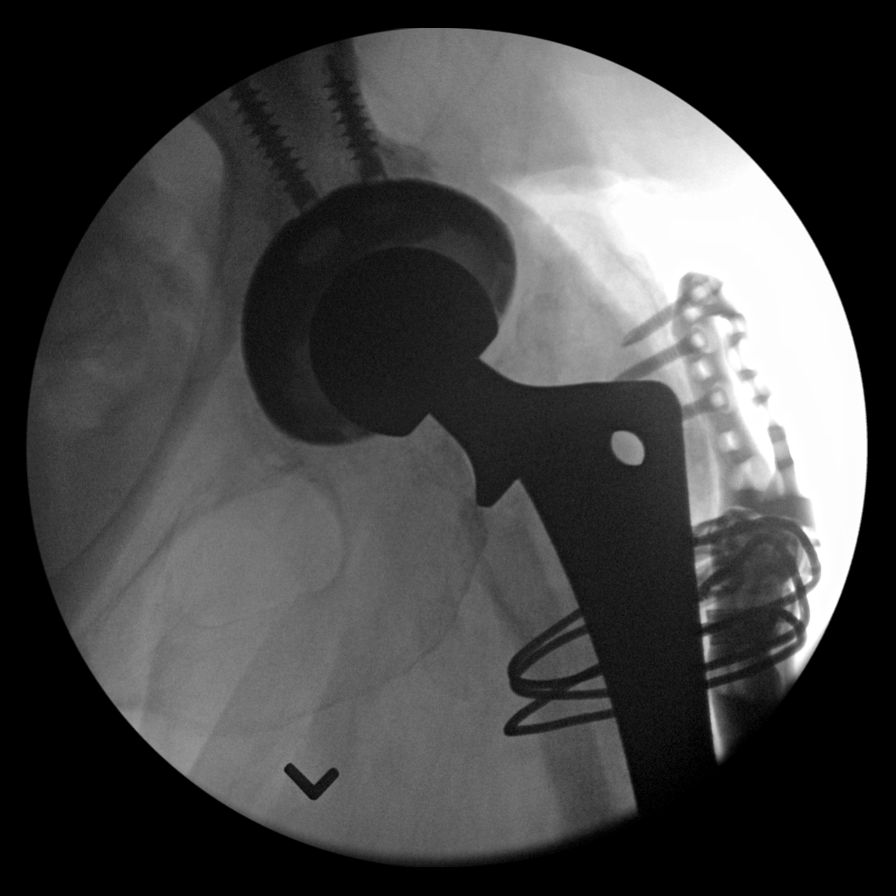
[im 3/6]
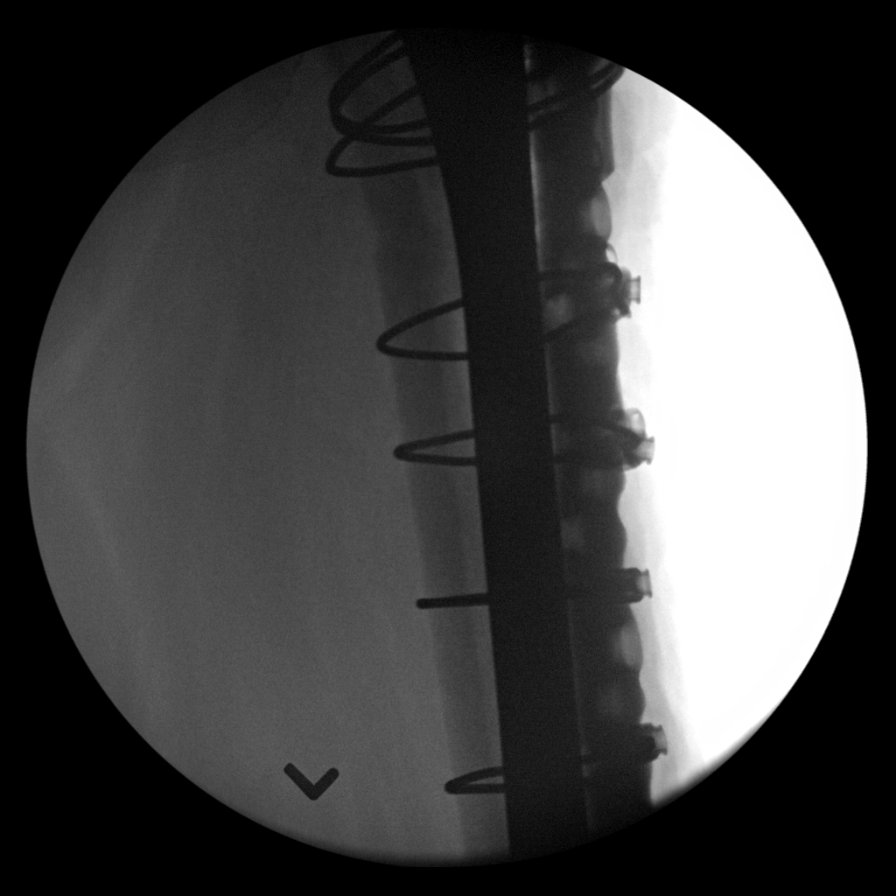
[im 4/6]
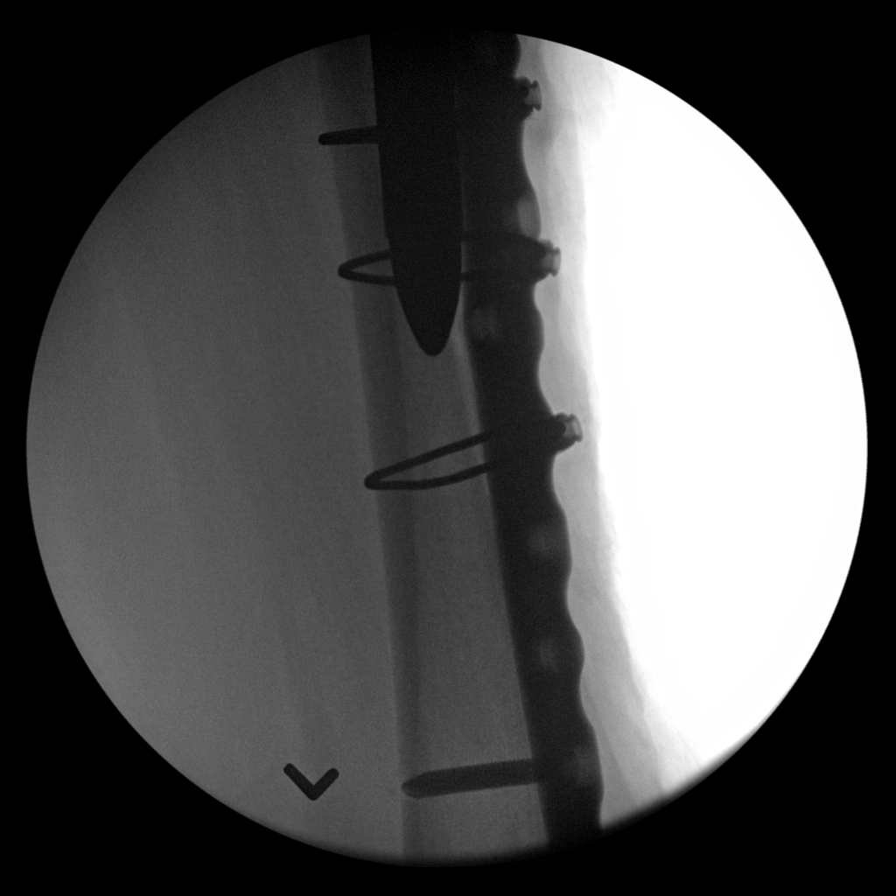
[im 5/6]
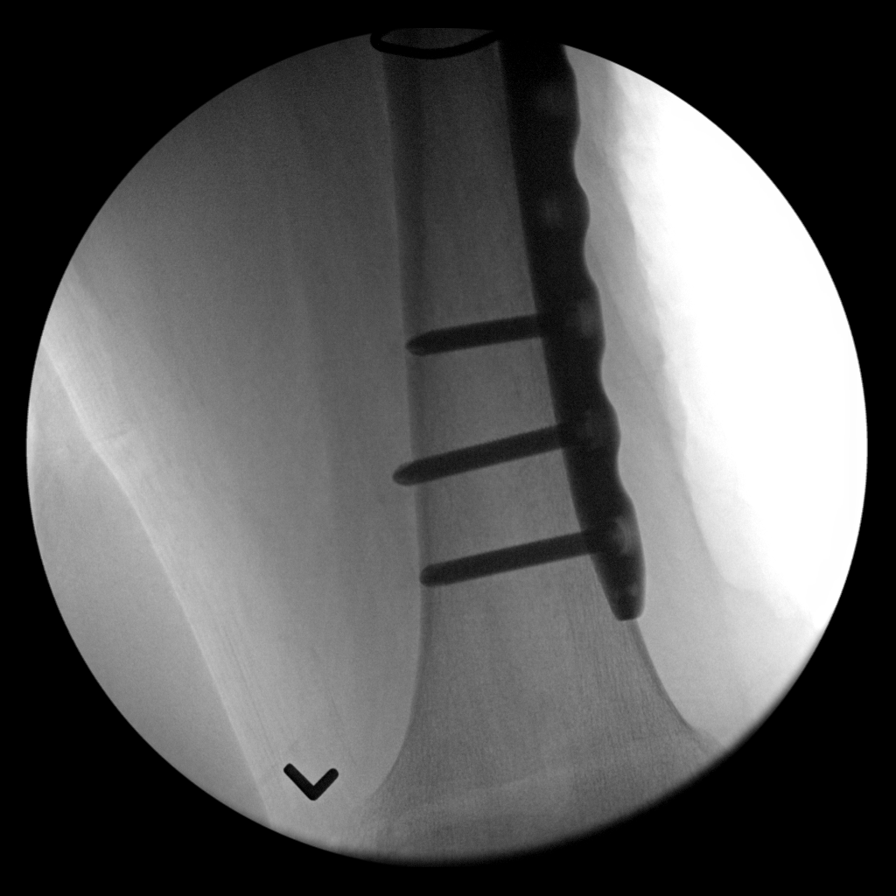
[im 6/6]
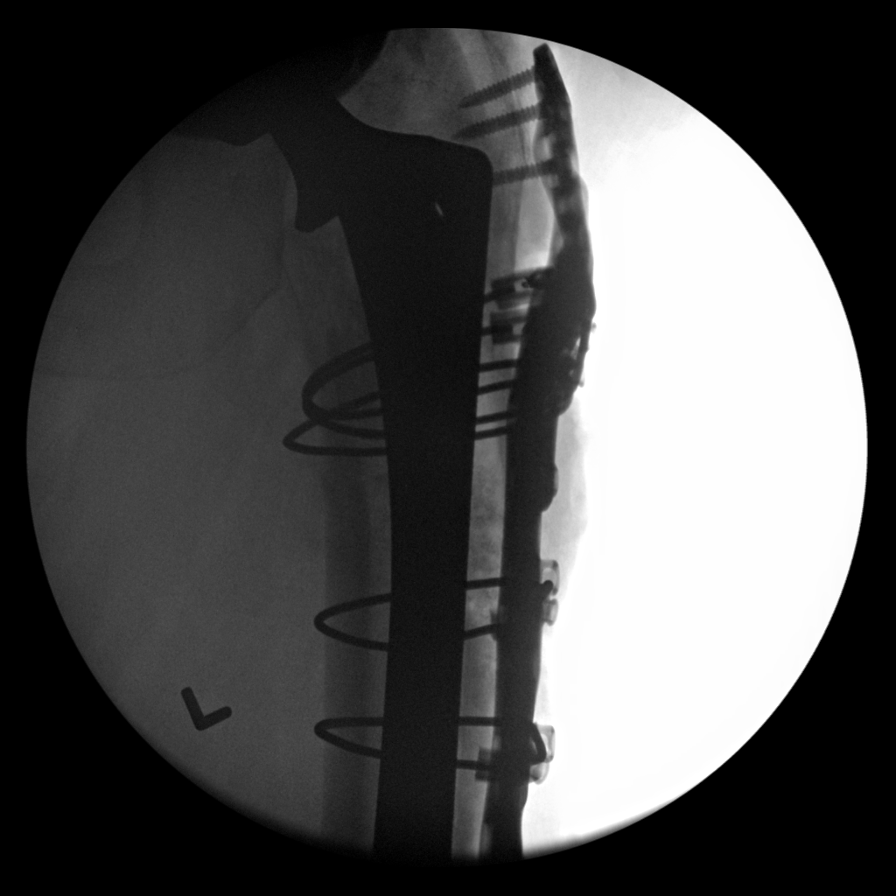

[6 of 6 positions shown; findings below may reference images not displayed]

FINDINGS: Six spot intraoperative fluoroscopic images of the left femur are
provided for review.

Images demonstrate the sequela of long segment sideplate fixation of
periprosthetic femur fracture. The sideplate is transfixed with
multiple cancellous screws as well as the application of additional
cerclage wires.

Alignment appears anatomic given coned field of view.

Stable sequela of prior left total hip replacement. There is a
minimal amount of expected subcutaneous emphysema scattered about
the operative site. No radiopaque foreign body.
IMPRESSION: Post sideplate fixation of periprosthetic femur fracture without
evidence of complication as detailed above.

## 2019-08-05 DIAGNOSIS — R1313 Dysphagia, pharyngeal phase: Secondary | ICD-10-CM | POA: Diagnosis not present

## 2019-08-05 DIAGNOSIS — Z2821 Immunization not carried out because of patient refusal: Secondary | ICD-10-CM | POA: Diagnosis not present

## 2019-08-05 DIAGNOSIS — Z6830 Body mass index (BMI) 30.0-30.9, adult: Secondary | ICD-10-CM | POA: Diagnosis not present

## 2019-08-05 DIAGNOSIS — Z9181 History of falling: Secondary | ICD-10-CM | POA: Diagnosis not present

## 2019-08-06 DIAGNOSIS — I700 Atherosclerosis of aorta: Secondary | ICD-10-CM | POA: Diagnosis not present

## 2019-08-06 DIAGNOSIS — M1990 Unspecified osteoarthritis, unspecified site: Secondary | ICD-10-CM | POA: Diagnosis not present

## 2019-08-06 DIAGNOSIS — R319 Hematuria, unspecified: Secondary | ICD-10-CM | POA: Diagnosis not present

## 2019-08-06 DIAGNOSIS — Z882 Allergy status to sulfonamides status: Secondary | ICD-10-CM | POA: Diagnosis not present

## 2019-08-06 DIAGNOSIS — E039 Hypothyroidism, unspecified: Secondary | ICD-10-CM | POA: Diagnosis not present

## 2019-08-06 DIAGNOSIS — N132 Hydronephrosis with renal and ureteral calculous obstruction: Secondary | ICD-10-CM | POA: Diagnosis not present

## 2019-08-06 DIAGNOSIS — E279 Disorder of adrenal gland, unspecified: Secondary | ICD-10-CM | POA: Diagnosis not present

## 2019-08-06 DIAGNOSIS — Z79899 Other long term (current) drug therapy: Secondary | ICD-10-CM | POA: Diagnosis not present

## 2019-08-06 DIAGNOSIS — K5730 Diverticulosis of large intestine without perforation or abscess without bleeding: Secondary | ICD-10-CM | POA: Diagnosis not present

## 2019-08-06 DIAGNOSIS — E785 Hyperlipidemia, unspecified: Secondary | ICD-10-CM | POA: Diagnosis not present

## 2019-08-06 DIAGNOSIS — R1031 Right lower quadrant pain: Secondary | ICD-10-CM | POA: Diagnosis not present

## 2019-08-06 DIAGNOSIS — N23 Unspecified renal colic: Secondary | ICD-10-CM | POA: Diagnosis not present

## 2019-08-06 DIAGNOSIS — K219 Gastro-esophageal reflux disease without esophagitis: Secondary | ICD-10-CM | POA: Diagnosis not present

## 2019-08-06 DIAGNOSIS — Z7983 Long term (current) use of bisphosphonates: Secondary | ICD-10-CM | POA: Diagnosis not present

## 2019-08-12 DIAGNOSIS — E0789 Other specified disorders of thyroid: Secondary | ICD-10-CM | POA: Diagnosis not present

## 2019-08-12 DIAGNOSIS — R1310 Dysphagia, unspecified: Secondary | ICD-10-CM | POA: Diagnosis not present

## 2019-08-12 DIAGNOSIS — R0989 Other specified symptoms and signs involving the circulatory and respiratory systems: Secondary | ICD-10-CM | POA: Diagnosis not present

## 2019-08-12 DIAGNOSIS — E034 Atrophy of thyroid (acquired): Secondary | ICD-10-CM | POA: Diagnosis not present

## 2019-09-01 DIAGNOSIS — J3489 Other specified disorders of nose and nasal sinuses: Secondary | ICD-10-CM | POA: Diagnosis not present

## 2019-09-01 DIAGNOSIS — J310 Chronic rhinitis: Secondary | ICD-10-CM | POA: Diagnosis not present

## 2019-09-01 DIAGNOSIS — Z87828 Personal history of other (healed) physical injury and trauma: Secondary | ICD-10-CM | POA: Diagnosis not present

## 2019-09-01 DIAGNOSIS — J342 Deviated nasal septum: Secondary | ICD-10-CM | POA: Diagnosis not present

## 2019-09-01 DIAGNOSIS — R1310 Dysphagia, unspecified: Secondary | ICD-10-CM | POA: Diagnosis not present

## 2019-09-01 DIAGNOSIS — R0989 Other specified symptoms and signs involving the circulatory and respiratory systems: Secondary | ICD-10-CM | POA: Diagnosis not present

## 2019-09-09 DIAGNOSIS — R1310 Dysphagia, unspecified: Secondary | ICD-10-CM | POA: Diagnosis not present

## 2019-09-16 DIAGNOSIS — R1310 Dysphagia, unspecified: Secondary | ICD-10-CM | POA: Diagnosis not present

## 2019-11-02 DIAGNOSIS — D649 Anemia, unspecified: Secondary | ICD-10-CM | POA: Diagnosis not present

## 2019-11-02 DIAGNOSIS — E559 Vitamin D deficiency, unspecified: Secondary | ICD-10-CM | POA: Diagnosis not present

## 2019-11-02 DIAGNOSIS — R7303 Prediabetes: Secondary | ICD-10-CM | POA: Diagnosis not present

## 2019-11-02 DIAGNOSIS — Z139 Encounter for screening, unspecified: Secondary | ICD-10-CM | POA: Diagnosis not present

## 2019-11-02 DIAGNOSIS — E669 Obesity, unspecified: Secondary | ICD-10-CM | POA: Diagnosis not present

## 2019-11-02 DIAGNOSIS — E039 Hypothyroidism, unspecified: Secondary | ICD-10-CM | POA: Diagnosis not present

## 2019-11-02 DIAGNOSIS — E785 Hyperlipidemia, unspecified: Secondary | ICD-10-CM | POA: Diagnosis not present

## 2019-12-09 DIAGNOSIS — Z1231 Encounter for screening mammogram for malignant neoplasm of breast: Secondary | ICD-10-CM | POA: Diagnosis not present

## 2019-12-10 ENCOUNTER — Ambulatory Visit (INDEPENDENT_AMBULATORY_CARE_PROVIDER_SITE_OTHER): Payer: Medicare Other

## 2019-12-10 ENCOUNTER — Ambulatory Visit (INDEPENDENT_AMBULATORY_CARE_PROVIDER_SITE_OTHER): Payer: Medicare Other | Admitting: Orthopaedic Surgery

## 2019-12-10 ENCOUNTER — Other Ambulatory Visit: Payer: Self-pay

## 2019-12-10 ENCOUNTER — Encounter: Payer: Self-pay | Admitting: Orthopaedic Surgery

## 2019-12-10 VITALS — BP 116/65 | HR 80 | Ht 65.0 in | Wt 194.0 lb

## 2019-12-10 DIAGNOSIS — M1712 Unilateral primary osteoarthritis, left knee: Secondary | ICD-10-CM | POA: Diagnosis not present

## 2019-12-10 DIAGNOSIS — Z96642 Presence of left artificial hip joint: Secondary | ICD-10-CM | POA: Diagnosis not present

## 2019-12-10 NOTE — Progress Notes (Signed)
Office Visit Note   Patient: Emma Lewis           Date of Birth: 04-29-1951           MRN: [id number] Visit Date: 12/10/2019              Requested by: Philmore Pali, NP [address] [place],   [id number] PCP: Philmore Pali, NP   Assessment & Plan: Visit Diagnoses:  1. History of left hip replacement   2. Unilateral primary osteoarthritis, left knee     Plan: Left knee injected with good relief post injection.  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  We will recheck her in 6 weeks.  Follow-Up Instructions: No follow-ups on file.   Orders:  Orders Placed This Encounter  Procedures  . Large Joint Inj  . XR FEMUR MIN 2 VIEWS LEFT   No orders of the defined types were placed in this encounter.     Procedures: Large Joint Inj: L knee on 12/10/2019 3:08 PM Indications: joint swelling and pain Details: 22 G 1.5 in needle, anterolateral approach  Arthrogram: No  Medications: 0.5 mL lidocaine 1 %; 3 mL bupivacaine 0.5 %; 40 mg methylPREDNISolone acetate 40 MG/ML Outcome: tolerated well, no immediate complications Procedure, treatment alternatives, risks and benefits explained, specific risks discussed. Consent was given by the patient. Immediately prior to procedure a time out was called to verify the correct patient, procedure, equipment, support staff and site/side marked as required. Patient was prepped and draped in the usual sterile fashion.       Clinical Data: No additional findings.   Subjective: Chief Complaint  Patient presents with  . Left Hip - Follow-up    HPI 69 year old female post multiple procedures left hip.  Originally she had total hip arthroplasty done in [place].  She had massive metal debris from trunnionosis from  A prosthesis that has been recalled.  She underwent total of arthroplasty later periprosthetic fracture eventually ended up with the longstem AML with a revision cemented with  lateral trochanteric plate extending down to the knee.  She has been ambulatory.  She had a PICC line in for infection with resolution of all infection.  Principal problem is been left knee pain improved residual quad weakness.  She is ambulatory with a single-point cane.  In the kitchen or how she walks and uses her hand hanging onto a counter wall with her right hand.  With more active days she is noted swelling in her left knee.  No problems with her right knee.  Review of Systems noncontributory as pertains to HPI all other systems.   Objective: Vital Signs: BP 116/65 (BP Location: Left Arm, Patient Position: Sitting, Cuff Size: Normal)   Pulse 80   Ht 5\' 5"  (1.651 m)   Wt 194 lb (88 kg)   BMI 32.28 kg/m   Physical Exam Constitutional:      Appearance: She is well-developed.  HENT:     Head: Normocephalic.     Right Ear: External ear normal.     Left Ear: External ear normal.  Eyes:     Pupils: Pupils are equal, round, and reactive to light.  Neck:     Thyroid: No thyromegaly.     Trachea: No tracheal deviation.  Cardiovascular:     Rate and Rhythm: Normal rate.  Pulmonary:     Effort: Pulmonary effort is normal.  Abdominal:  Palpations: Abdomen is soft.  Skin:    General: Skin is warm and dry.  Neurological:     Mental Status: She is alert and oriented to person, place, and time.  Psychiatric:        Behavior: Behavior normal.     Ortho Exam patient has a 2+ knee effusion no increased warmth well-healed lateral incision.  No erythema no cellulitis.  Distal pulses are intact.  She has fairly good hip flexion strength but has weak quad and has trouble handling hand resistance with straight leg raising.  Opposite right quad is strong.  Specialty Comments:  No specialty comments available.  Imaging: No results found.   PMFS History: Patient Active Problem List   Diagnosis Date Noted  . Unilateral primary osteoarthritis, left knee 12/10/2019  . Thoracic  compression fracture ([place]) 12/11/2018  . Symptomatic anemia   . Ambulatory dysfunction 07/21/2018  . Weakness of both legs 07/21/2018  . Proteus mirabilis infection   . Hip hematoma, left 06/19/2018  . Hematoma of left hip 06/19/2018  . Hip dislocation, left ([place]) 05/29/2018  . Peri-prosthetic femur fracture at tip of prosthesis 05/29/2018  . Prediabetes   . Popping sound of knee joint   . Hypoalbuminemia due to protein-calorie malnutrition ([place])   . Transaminitis   . Leukocytosis   . Postoperative pain   . 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 Acute blood loss anemia   . Leukemoid reaction   . 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 Joint effusion of pelvis or thigh, left 04/22/2018  . Trochanteric bursitis, left hip 04/22/2018  . History of left hip replacement 11/12/2017  . GERD (gastroesophageal reflux disease) 01/17/2017  . Hypothyroidism 01/17/2017  . Vertigo 01/16/2017  . Nausea and vomiting 01/16/2017  . Essential hypertension 01/16/2017  . Fatty liver 01/16/2017  . Failed total hip arthroplasty ([place]) 06/10/2016  . Pelvic mass 06/15/2015  . Closed wedge compression fracture of fifth lumbar vertebra ([place])   . Cervical spondylosis 02/09/2014  . DDD (degenerative disc disease), lumbar 05/13/2013  . OA (osteoarthritis) 05/13/2013   Past Medical History:  Diagnosis Date  . Allergies   . Arthritis    "all over my body" (04/29/2018)  . Chronic lower back pain   . Fatty liver   . Fever blister    Takes Acyclovir  . GERD (gastroesophageal reflux disease)   . Hyperlipemia   . Hypothyroidism   . Migraines   . Osteopenia   . Pneumonia    "once; years ago" (04/29/2018)  . PONV (postoperative nausea and vomiting)    "dry heaves"  . Pre-diabetes   . Seasonal allergies   . Vertigo     Family History  Problem Relation Age of Onset  . Lymphoma Mother     Past Surgical History:  Procedure Laterality Date  . ANTERIOR CERVICAL  DECOMP/DISCECTOMY FUSION N/A 02/09/2014   Procedure: ANTERIOR CERVICAL DECOMPRESSION/DISCECTOMY FUSION 2 LEVELS;  Surgeon: [name], MD;  Location: [facility];  Service: Orthopedics;  Laterality: N/A;  C4-5, C5-6 Anterior Cervical Discectomy and Fusion, Allograft, Plate  . BACK SURGERY    . CARPAL TUNNEL RELEASE Right 2018  . CATARACT EXTRACTION W/ INTRAOCULAR LENS  IMPLANT, BILATERAL    . COLONOSCOPY    . FIXATION KYPHOPLASTY LUMBAR SPINE  X 2  . HIP ARTHROPLASTY Left 2011  . INCISION AND DRAINAGE HIP Left 06/19/2018   Procedure: IRRIGATION AND DEBRIDEMENT LEFT HIP HEMATOMA ANTIBIOTIC BEADS, APPLICATION ON WOUND VAC;  Surgeon: [name], MD;  Location: [facility];  Service: Orthopedics;  Laterality: Left;  . JOINT REPLACEMENT    . KNEE ARTHROSCOPY Right   . ORIF PERIPROSTHETIC FRACTURE Left 06/01/2018   Procedure: OPEN REDUCTION INTERNAL FIXATION (ORIF) PERIPROSTHETIC FEMUR FRACTURE;  Surgeon: [name], MD;  Location: [facility];  Service: Orthopedics;  Laterality: Left;  . PATELLA FRACTURE SURGERY Right ~ 1990   "put metal in"  . PATELLA HARDWARE REMOVAL Right    "took the metal out"  . REVISION TOTAL HIP ARTHROPLASTY Left 04/29/2018  . ROBOTIC ASSISTED BILATERAL SALPINGO OOPHERECTOMY Bilateral 08/01/2015   Procedure: ROBOTIC ASSISTED BILATERAL SALPINGO OOPHORECTOMY;  Surgeon: Everitt Amber, MD;  [facility];  Service: Gynecology;  Laterality: Bilateral;  . SHOULDER OPEN ROTATOR CUFF REPAIR Right    3 TOTAL  . SHOULDER SURGERY Right   . TOTAL HIP ARTHROPLASTY Right 01/21/2018   Procedure: RIGHT TOTAL HIP ARTHROPLASTY DIRECT ANTERIOR;  Surgeon: [name], MD;  Location: [facility];  Service: Orthopedics;  Laterality: Right;  . TOTAL HIP REVISION Left 06/10/2016   Procedure: TOTAL HIP REVISION ARTHROPLASTY;  Surgeon: Rod Can, MD;  Location: [facility];  Service: Orthopedics;  Laterality: Left;  . TOTAL HIP REVISION Left 04/29/2018   Procedure: left total hip arthroplasty revision posterior  approach;  Surgeon: [name], MD;  Location: [facility];  Service: Orthopedics;  Laterality: Left;  . TOTAL HIP REVISION Left 09/21/2018   Procedure: left total hip arthroplasty femoral revision;  Surgeon: [name], MD;  Location: [facility];  Service: Orthopedics;  Laterality: Left;  . WRIST GANGLION EXCISION Left 1970s   Social History   Occupational History  . Not on file  Tobacco Use  . Smoking status: Never Smoker  . Smokeless tobacco: Never Used  Substance and Sexual Activity  . Alcohol use: Not Currently    Comment: `  . Drug use: Never  . Sexual activity: Not Currently

## 2019-12-11 MED ORDER — METHYLPREDNISOLONE ACETATE 40 MG/ML IJ SUSP
40.0000 mg | INTRAMUSCULAR | Status: AC | PRN
  Administered 2019-12-10: 40 mg via INTRA_ARTICULAR

## 2019-12-11 MED ORDER — BUPIVACAINE HCL 0.5 % IJ SOLN
3.0000 mL | INTRAMUSCULAR | Status: AC | PRN
  Administered 2019-12-10: 3 mL via INTRA_ARTICULAR

## 2019-12-11 MED ORDER — LIDOCAINE HCL 1 % IJ SOLN
0.5000 mL | INTRAMUSCULAR | Status: AC | PRN
  Administered 2019-12-10: .5 mL

## 2019-12-15 DIAGNOSIS — E039 Hypothyroidism, unspecified: Secondary | ICD-10-CM | POA: Diagnosis not present

## 2020-01-10 ENCOUNTER — Other Ambulatory Visit: Payer: Self-pay | Admitting: Orthopaedic Surgery

## 2020-01-10 NOTE — Telephone Encounter (Signed)
Please advise 

## 2020-01-25 ENCOUNTER — Ambulatory Visit (INDEPENDENT_AMBULATORY_CARE_PROVIDER_SITE_OTHER): Payer: Medicare Other | Admitting: Orthopaedic Surgery

## 2020-01-25 DIAGNOSIS — Z96642 Presence of left artificial hip joint: Secondary | ICD-10-CM | POA: Diagnosis not present

## 2020-01-25 NOTE — Progress Notes (Signed)
Office Visit Note   Patient: Emma Lewis           Date of Birth: March 16, 1951           MRN: [id number] Visit Date: 01/25/2020              Requested by: Philmore Pali, NP [address] [place],  [place] [address] PCP: Philmore Pali, NP   Assessment & Plan: Visit Diagnoses:  1. History of left hip replacement     Plan: Patient will continue to do quad strengthening exercises.  She understands extensive scar tissue from periprosthetic fracture revision surgery and long lateral incision with plating will take extensive time to continue to get the quad stronger.  She will keep working at this.  She can follow-up as needed.  Follow-Up Instructions: No follow-ups on file.   Orders:  No orders of the defined types were placed in this encounter.  No orders of the defined types were placed in this encounter.     Procedures: No procedures performed   Clinical Data: No additional findings.   Subjective: Chief Complaint  Patient presents with  . Left Leg - Pain, Follow-up    HPI patient returns post knee injection 12/10/2019.  She states the injection did not help her knee she continues to have pain in her left thigh likely related to femoral revision surgery told up arthroplasty revision for dislocation and for fracture with longstem cemented stem and long plate.  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  She uses a slide on knee sleeve which has helped some.  She is amatory with a cane.  Leg lengths are good she uses ice and heat and is not on any narcotic medication.  Review of Systems updated unchanged all systems other than as above no chills or fever no erythema over incisions.   Objective: Vital Signs: There were no vitals taken for this visit.  Physical Exam Constitutional:      Appearance: She is well-developed.  HENT:     Head: Normocephalic.     Right Ear: External ear normal.     Left Ear: External ear normal.  Eyes:     Pupils: Pupils are equal,  round, and reactive to light.  Neck:     Thyroid: No thyromegaly.     Trachea: No tracheal deviation.  Cardiovascular:     Rate and Rhythm: Normal rate.  Pulmonary:     Effort: Pulmonary effort is normal.  Abdominal:     Palpations: Abdomen is soft.  Skin:    General: Skin is warm and dry.  Neurological:     Mental Status: She is alert and oriented to person, place, and time.  Psychiatric:        Behavior: Behavior normal.     Ortho Exam patient has some quad weakness with resisted testing.  She is amatory with a cane.  No pain with hip range of motion.  Specialty Comments:  No specialty comments available.  Imaging: No results found.   PMFS History: Patient Active Problem List   Diagnosis Date Noted  . Unilateral primary osteoarthritis, left knee 12/10/2019  . Thoracic compression fracture ([place]) 12/11/2018  . Symptomatic anemia   . Ambulatory dysfunction 07/21/2018  . Weakness of both legs 07/21/2018  . Proteus mirabilis infection   . Hip hematoma, left 06/19/2018  . Hematoma of left hip 06/19/2018  . Hip dislocation, left ([place]) 05/29/2018  . Peri-prosthetic femur fracture at tip of prosthesis  05/29/2018  . Prediabetes   . Popping sound of knee joint   . Hypoalbuminemia due to protein-calorie malnutrition ([facility])   . Transaminitis   . Leukocytosis   . Postoperative pain   . 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 Acute blood loss anemia   . Leukemoid reaction   . 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 Joint effusion of pelvis or thigh, left 04/22/2018  . Trochanteric bursitis, left hip 04/22/2018  . History of left hip replacement 11/12/2017  . GERD (gastroesophageal reflux disease) 01/17/2017  . Hypothyroidism 01/17/2017  . Vertigo 01/16/2017  . Nausea and vomiting 01/16/2017  . Essential hypertension 01/16/2017  . Fatty liver 01/16/2017  . Failed total hip arthroplasty ([facility]) 06/10/2016  . Pelvic mass 06/15/2015  . Closed  wedge compression fracture of fifth lumbar vertebra ([place])   . Cervical spondylosis 02/09/2014  . DDD (degenerative disc disease), lumbar 05/13/2013  . OA (osteoarthritis) 05/13/2013   Past Medical History:  Diagnosis Date  . Allergies   . Arthritis    "all over my body" (04/29/2018)  . Chronic lower back pain   . Fatty liver   . Fever blister    Takes Acyclovir  . GERD (gastroesophageal reflux disease)   . Hyperlipemia   . Hypothyroidism   . Migraines   . Osteopenia   . Pneumonia    "once; years ago" (04/29/2018)  . PONV (postoperative nausea and vomiting)    "dry heaves"  . Pre-diabetes   . Seasonal allergies   . Vertigo     Family History  Problem Relation Age of Onset  . Lymphoma Mother     Past Surgical History:  Procedure Laterality Date  . ANTERIOR CERVICAL DECOMP/DISCECTOMY FUSION N/A 02/09/2014   Procedure: ANTERIOR CERVICAL DECOMPRESSION/DISCECTOMY FUSION 2 LEVELS;  Surgeon: [name], MD;  Location: [facility];  Service: Orthopedics;  Laterality: N/A;  C4-5, C5-6 Anterior Cervical Discectomy and Fusion, Allograft, Plate  . BACK SURGERY    . CARPAL TUNNEL RELEASE Right 2018  . CATARACT EXTRACTION W/ INTRAOCULAR LENS  IMPLANT, BILATERAL    . COLONOSCOPY    . FIXATION KYPHOPLASTY LUMBAR SPINE  X 2  . HIP ARTHROPLASTY Left 2011  . INCISION AND DRAINAGE HIP Left 06/19/2018   Procedure: IRRIGATION AND DEBRIDEMENT LEFT HIP HEMATOMA ANTIBIOTIC BEADS, APPLICATION ON WOUND VAC;  Surgeon: [name], MD;  Location: [facility];  Service: Orthopedics;  Laterality: Left;  . JOINT REPLACEMENT    . KNEE ARTHROSCOPY Right   . ORIF PERIPROSTHETIC FRACTURE Left 06/01/2018   Procedure: OPEN REDUCTION INTERNAL FIXATION (ORIF) PERIPROSTHETIC FEMUR FRACTURE;  Surgeon: [name], MD;  Location: [facility];  Service: Orthopedics;  Laterality: Left;  . PATELLA FRACTURE SURGERY Right ~ 1990   "put metal in"  . PATELLA HARDWARE REMOVAL Right    "took the metal out"  . REVISION TOTAL HIP  ARTHROPLASTY Left 04/29/2018  . ROBOTIC ASSISTED BILATERAL SALPINGO OOPHERECTOMY Bilateral 08/01/2015   Procedure: ROBOTIC ASSISTED BILATERAL SALPINGO OOPHORECTOMY;  Surgeon: Everitt Amber, MD;  [facility];  Service: Gynecology;  Laterality: Bilateral;  . SHOULDER OPEN ROTATOR CUFF REPAIR Right    3 TOTAL  . SHOULDER SURGERY Right   . TOTAL HIP ARTHROPLASTY Right 01/21/2018   Procedure: RIGHT TOTAL HIP ARTHROPLASTY DIRECT ANTERIOR;  Surgeon: [name], MD;  Location: [facility];  Service: Orthopedics;  Laterality: Right;  . TOTAL HIP REVISION Left 06/10/2016   Procedure: TOTAL HIP REVISION ARTHROPLASTY;  Surgeon:  Rod Can, MD;  Location: [facility];  Service: Orthopedics;  Laterality: Left;  . TOTAL HIP REVISION Left 04/29/2018   Procedure: left total hip arthroplasty revision posterior approach;  Surgeon: [name], MD;  Location: [facility];  Service: Orthopedics;  Laterality: Left;  . TOTAL HIP REVISION Left 09/21/2018   Procedure: left total hip arthroplasty femoral revision;  Surgeon: [name], MD;  Location: [facility];  Service: Orthopedics;  Laterality: Left;  . WRIST GANGLION EXCISION Left 1970s   Social History   Occupational History  . Not on file  Tobacco Use  . Smoking status: Never Smoker  . Smokeless tobacco: Never Used  Vaping Use  . Vaping Use: Never used  Substance and Sexual Activity  . Alcohol use: Not Currently    Comment: `  . Drug use: Never  . Sexual activity: Not Currently

## 2020-03-08 DIAGNOSIS — M545 Low back pain: Secondary | ICD-10-CM | POA: Diagnosis not present

## 2020-03-08 DIAGNOSIS — M47896 Other spondylosis, lumbar region: Secondary | ICD-10-CM | POA: Diagnosis not present

## 2020-03-08 DIAGNOSIS — S22080A Wedge compression fracture of T11-T12 vertebra, initial encounter for closed fracture: Secondary | ICD-10-CM | POA: Diagnosis not present

## 2020-03-08 DIAGNOSIS — M461 Sacroiliitis, not elsewhere classified: Secondary | ICD-10-CM | POA: Diagnosis not present

## 2020-03-20 ENCOUNTER — Telehealth: Payer: Self-pay | Admitting: Orthopaedic Surgery

## 2020-03-20 NOTE — Telephone Encounter (Signed)
Patient is going to have some dental implants done and needs to know if you require her to have antibiotics prior to procedure.   Patient requests if antibiotics are needed that they be sent to [facility] in [place].  Please advise.

## 2020-03-20 NOTE — Telephone Encounter (Signed)
Pt called looking for betsy; and asked that she give her a CB the pt states she has some questions.  [phone]

## 2020-03-21 NOTE — Telephone Encounter (Signed)
Ucall. No ABX needed.

## 2020-03-22 NOTE — Telephone Encounter (Signed)
I left voicemail for patient advising. 

## 2020-03-28 ENCOUNTER — Telehealth: Payer: Self-pay | Admitting: Orthopaedic Surgery

## 2020-03-28 NOTE — Telephone Encounter (Signed)
Pt called stating she has a question for [employer] and would like to get her opinion on something; that was all the pt was willing to tell me  [phone]

## 2020-03-29 NOTE — Telephone Encounter (Signed)
I left voicemail for return call.

## 2020-03-30 DIAGNOSIS — H18523 Epithelial (juvenile) corneal dystrophy, bilateral: Secondary | ICD-10-CM | POA: Diagnosis not present

## 2020-03-30 DIAGNOSIS — H43813 Vitreous degeneration, bilateral: Secondary | ICD-10-CM | POA: Diagnosis not present

## 2020-04-10 DIAGNOSIS — M461 Sacroiliitis, not elsewhere classified: Secondary | ICD-10-CM | POA: Diagnosis not present

## 2020-04-10 DIAGNOSIS — M47896 Other spondylosis, lumbar region: Secondary | ICD-10-CM | POA: Diagnosis not present

## 2020-04-25 DIAGNOSIS — M47896 Other spondylosis, lumbar region: Secondary | ICD-10-CM | POA: Diagnosis not present

## 2020-04-25 DIAGNOSIS — M79652 Pain in left thigh: Secondary | ICD-10-CM | POA: Diagnosis not present

## 2020-04-25 DIAGNOSIS — M461 Sacroiliitis, not elsewhere classified: Secondary | ICD-10-CM | POA: Diagnosis not present

## 2020-05-03 DIAGNOSIS — M79652 Pain in left thigh: Secondary | ICD-10-CM | POA: Diagnosis not present

## 2020-05-03 DIAGNOSIS — E559 Vitamin D deficiency, unspecified: Secondary | ICD-10-CM | POA: Diagnosis not present

## 2020-05-03 DIAGNOSIS — D649 Anemia, unspecified: Secondary | ICD-10-CM | POA: Diagnosis not present

## 2020-05-03 DIAGNOSIS — R7303 Prediabetes: Secondary | ICD-10-CM | POA: Diagnosis not present

## 2020-05-03 DIAGNOSIS — R262 Difficulty in walking, not elsewhere classified: Secondary | ICD-10-CM | POA: Diagnosis not present

## 2020-05-03 DIAGNOSIS — M545 Low back pain, unspecified: Secondary | ICD-10-CM | POA: Diagnosis not present

## 2020-05-03 DIAGNOSIS — E785 Hyperlipidemia, unspecified: Secondary | ICD-10-CM | POA: Diagnosis not present

## 2020-05-03 DIAGNOSIS — E039 Hypothyroidism, unspecified: Secondary | ICD-10-CM | POA: Diagnosis not present

## 2020-05-03 DIAGNOSIS — G8929 Other chronic pain: Secondary | ICD-10-CM | POA: Diagnosis not present

## 2020-05-03 DIAGNOSIS — K760 Fatty (change of) liver, not elsewhere classified: Secondary | ICD-10-CM | POA: Diagnosis not present

## 2020-05-03 DIAGNOSIS — Z6829 Body mass index (BMI) 29.0-29.9, adult: Secondary | ICD-10-CM | POA: Diagnosis not present

## 2020-05-03 DIAGNOSIS — I10 Essential (primary) hypertension: Secondary | ICD-10-CM | POA: Diagnosis not present

## 2020-05-11 DIAGNOSIS — M79652 Pain in left thigh: Secondary | ICD-10-CM | POA: Diagnosis not present

## 2020-05-11 DIAGNOSIS — T8484XA Pain due to internal orthopedic prosthetic devices, implants and grafts, initial encounter: Secondary | ICD-10-CM | POA: Diagnosis not present

## 2020-05-23 ENCOUNTER — Other Ambulatory Visit: Payer: Self-pay

## 2020-05-23 ENCOUNTER — Ambulatory Visit (INDEPENDENT_AMBULATORY_CARE_PROVIDER_SITE_OTHER): Payer: Medicare Other | Admitting: Orthopaedic Surgery

## 2020-05-23 ENCOUNTER — Ambulatory Visit (INDEPENDENT_AMBULATORY_CARE_PROVIDER_SITE_OTHER): Payer: Medicare Other

## 2020-05-23 ENCOUNTER — Encounter: Payer: Self-pay | Admitting: Orthopaedic Surgery

## 2020-05-23 VITALS — BP 117/69 | Ht 65.0 in | Wt 172.0 lb

## 2020-05-23 DIAGNOSIS — M79605 Pain in left leg: Secondary | ICD-10-CM | POA: Diagnosis not present

## 2020-05-23 DIAGNOSIS — Z96649 Presence of unspecified artificial hip joint: Secondary | ICD-10-CM

## 2020-05-24 NOTE — Progress Notes (Signed)
Office Visit Note   Patient: Emma Lewis           Date of Birth: 27-Aug-1950           MRN: [id number] Visit Date: 05/23/2020              Requested by: Philmore Pali, NP [address] [place],  [place] [address] PCP: Philmore Pali, NP   Assessment & Plan: Visit Diagnoses:  1. Pain in left leg   2. History of revision of total hip arthroplasty     Plan: Reviewed x-rays.  Bone scan shows some increase activity as expected with periprosthetic fracture with the plate and cables.  No loosening no subsidence.  All cables are intact all screws are intact the stem is cemented without change in position.  She does have some mild arthritis in her knee but previous injection her knee did not give her any improvement.  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  Could be that some of her pain is coming from lumbar spine.  She continues to ambulate well currently I will check her back on an as-needed basis.  Follow-Up Instructions: No follow-ups on file.   Orders:  Orders Placed This Encounter  Procedures  . XR FEMUR MIN 2 VIEWS LEFT   No orders of the defined types were placed in this encounter.     Procedures: No procedures performed   Clinical Data: No additional findings.   Subjective: Chief Complaint  Patient presents with  . Left Leg - Pain    HPI 69 year old female returns.  She had a televisit with a PA with [facility] in [place].  She has had a bone scan that showed increased activity along the femur as expected with periprosthetic fracture and long plate with multiple cables.  X-rays today show no evidence of loosening or subsidence.  She had some discomfort in the supracondylar region.  Some days is worse than others other day she walks in the house without the cane and uses a cane when she walks in the community.  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  Prosthesis was discontinued due to metallosis problems and patient had a revision procedure with large amount of tissue debrided later developed postop infection had washout procedure beads IV antibiotics.  Then had hip dislocation periprosthetic fracture then revision with cemented stem and long plate.  Review of Systems no fever no chills no drainage.  Hip incisions are well-healed.   Objective: Vital Signs: BP 117/69   Ht 5\' 5"  (1.651 m)   Wt 172 lb (78 kg)   BMI 28.62 kg/m   Physical Exam Constitutional:      Appearance: She is well-developed.  HENT:     Head: Normocephalic.     Right Ear: External ear normal.     Left Ear: External ear normal.  Eyes:     Pupils: Pupils are equal, round, and reactive to light.  Neck:     Thyroid: No thyromegaly.     Trachea: No tracheal deviation.  Cardiovascular:     Rate and Rhythm: Normal rate.  Pulmonary:     Effort: Pulmonary effort is normal.  Abdominal:     Palpations: Abdomen is soft.  Skin:    General: Skin is warm and dry.  Neurological:     Mental Status: She is alert and oriented to person, place,  and time.  Psychiatric:        Behavior: Behavior normal.     Ortho Exam patient has a well-healed hip incision.  Mild crepitus knee range of motion minimal joint line tenderness.  No knee effusion.  Collateral ligaments are stable.  She has full extension of her knee flexes 210 degrees.  Specialty Comments:  No specialty comments available.  Imaging: XR FEMUR MIN 2 VIEWS LEFT  Result Date: 05/24/2020 AP and lateral left femur obtained and reviewed.  This showed cemented revision stem with a long trochanteric plate extending almost to the knee no loosening.  A cables are noted multiple screws good position unchanged from previous radiographs no evidence of loosening or subsidence. Impression: Stable left hip revision with periprosthetic fracture stabilized with lateral plate.    PMFS History: Patient  Active Problem List   Diagnosis Date Noted  . Unilateral primary osteoarthritis, left knee 12/10/2019  . Thoracic compression fracture ([place]) 12/11/2018  . Symptomatic anemia   . Ambulatory dysfunction 07/21/2018  . Weakness of both legs 07/21/2018  . Proteus mirabilis infection   . Hip hematoma, left 06/19/2018  . Hematoma of left hip 06/19/2018  . Hip dislocation, left ([place]) 05/29/2018  . Peri-prosthetic femur fracture at tip of prosthesis 05/29/2018  . Prediabetes   . Popping sound of knee joint   . Hypoalbuminemia due to protein-calorie malnutrition ([place])   . Transaminitis   . Leukocytosis   . Postoperative pain   . 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 Acute blood loss anemia   . Leukemoid reaction   . 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 Joint effusion of pelvis or thigh, left 04/22/2018  . Trochanteric bursitis, left hip 04/22/2018  . History of revision of total hip arthroplasty 11/12/2017  . GERD (gastroesophageal reflux disease) 01/17/2017  . Hypothyroidism 01/17/2017  . Vertigo 01/16/2017  . Nausea and vomiting 01/16/2017  . Essential hypertension 01/16/2017  . Fatty liver 01/16/2017  . Failed total hip arthroplasty ([place]) 06/10/2016  . Pelvic mass 06/15/2015  . Closed wedge compression fracture of fifth lumbar vertebra ([place])   . Cervical spondylosis 02/09/2014  . DDD (degenerative disc disease), lumbar 05/13/2013  . OA (osteoarthritis) 05/13/2013   Past Medical History:  Diagnosis Date  . Allergies   . Arthritis    "all over my body" (04/29/2018)  . Chronic lower back pain   . Fatty liver   . Fever blister    Takes Acyclovir  . GERD (gastroesophageal reflux disease)   . Hyperlipemia   . Hypothyroidism   . Migraines   . Osteopenia   . Pneumonia    "once; years ago" (04/29/2018)  . PONV (postoperative nausea and vomiting)    "dry heaves"  . Pre-diabetes   . Seasonal allergies   . Vertigo     Family History  Problem  Relation Age of Onset  . Lymphoma Mother     Past Surgical History:  Procedure Laterality Date  . ANTERIOR CERVICAL DECOMP/DISCECTOMY FUSION N/A 02/09/2014   Procedure: ANTERIOR CERVICAL DECOMPRESSION/DISCECTOMY FUSION 2 LEVELS;  Surgeon: [name], MD;  Location: [facility];  Service: Orthopedics;  Laterality: N/A;  C4-5, C5-6 Anterior Cervical Discectomy and Fusion, Allograft, Plate  . BACK SURGERY    . CARPAL TUNNEL RELEASE Right 2018  . CATARACT EXTRACTION W/ INTRAOCULAR LENS  IMPLANT, BILATERAL    . COLONOSCOPY    . FIXATION KYPHOPLASTY LUMBAR SPINE  X 2  . HIP ARTHROPLASTY Left 2011  .  INCISION AND DRAINAGE HIP Left 06/19/2018   Procedure: IRRIGATION AND DEBRIDEMENT LEFT HIP HEMATOMA ANTIBIOTIC BEADS, APPLICATION ON WOUND VAC;  Surgeon: [name], MD;  Location: [facility];  Service: Orthopedics;  Laterality: Left;  . JOINT REPLACEMENT    . KNEE ARTHROSCOPY Right   . ORIF PERIPROSTHETIC FRACTURE Left 06/01/2018   Procedure: OPEN REDUCTION INTERNAL FIXATION (ORIF) PERIPROSTHETIC FEMUR FRACTURE;  Surgeon: [name], MD;  Location: [facility];  Service: Orthopedics;  Laterality: Left;  . PATELLA FRACTURE SURGERY Right ~ 1990   "put metal in"  . PATELLA HARDWARE REMOVAL Right    "took the metal out"  . REVISION TOTAL HIP ARTHROPLASTY Left 04/29/2018  . ROBOTIC ASSISTED BILATERAL SALPINGO OOPHERECTOMY Bilateral 08/01/2015   Procedure: ROBOTIC ASSISTED BILATERAL SALPINGO OOPHORECTOMY;  Surgeon: Everitt Amber, MD;  Location[facility];  Service: Gynecology;  Laterality: Bilateral;  . SHOULDER OPEN ROTATOR CUFF REPAIR Right    3 TOTAL  . SHOULDER SURGERY Right   . TOTAL HIP ARTHROPLASTY Right 01/21/2018   Procedure: RIGHT TOTAL HIP ARTHROPLASTY DIRECT ANTERIOR;  Surgeon: [name], MD;  Location: [facility];  Service: Orthopedics;  Laterality: Right;  . TOTAL HIP REVISION Left 06/10/2016   Procedure: TOTAL HIP REVISION ARTHROPLASTY;  Surgeon: Rod Can, MD;  Location: [facility];  Service: Orthopedics;   Laterality: Left;  . TOTAL HIP REVISION Left 04/29/2018   Procedure: left total hip arthroplasty revision posterior approach;  Surgeon: [name], MD;  Location: [facility];  Service: Orthopedics;  Laterality: Left;  . TOTAL HIP REVISION Left 09/21/2018   Procedure: left total hip arthroplasty femoral revision;  Surgeon: [name], MD;  Location: [facility];  Service: Orthopedics;  Laterality: Left;  . WRIST GANGLION EXCISION Left 1970s   Social History   Occupational History  . Not on file  Tobacco Use  . Smoking status: Never Smoker  . Smokeless tobacco: Never Used  Vaping Use  . Vaping Use: Never used  Substance and Sexual Activity  . Alcohol use: Not Currently    Comment: `  . Drug use: Never  . Sexual activity: Not Currently

## 2020-06-07 DIAGNOSIS — N3946 Mixed incontinence: Secondary | ICD-10-CM | POA: Diagnosis not present

## 2020-06-07 DIAGNOSIS — R350 Frequency of micturition: Secondary | ICD-10-CM | POA: Diagnosis not present

## 2020-08-01 DIAGNOSIS — H18523 Epithelial (juvenile) corneal dystrophy, bilateral: Secondary | ICD-10-CM | POA: Diagnosis not present

## 2020-08-02 DIAGNOSIS — N3946 Mixed incontinence: Secondary | ICD-10-CM | POA: Diagnosis not present

## 2020-08-02 DIAGNOSIS — R311 Benign essential microscopic hematuria: Secondary | ICD-10-CM | POA: Diagnosis not present

## 2020-08-02 DIAGNOSIS — R350 Frequency of micturition: Secondary | ICD-10-CM | POA: Diagnosis not present

## 2020-08-26 DIAGNOSIS — Z20822 Contact with and (suspected) exposure to covid-19: Secondary | ICD-10-CM | POA: Diagnosis not present

## 2020-08-26 DIAGNOSIS — J069 Acute upper respiratory infection, unspecified: Secondary | ICD-10-CM | POA: Diagnosis not present

## 2020-08-29 DIAGNOSIS — H18523 Epithelial (juvenile) corneal dystrophy, bilateral: Secondary | ICD-10-CM | POA: Diagnosis not present

## 2020-09-01 DIAGNOSIS — H18523 Epithelial (juvenile) corneal dystrophy, bilateral: Secondary | ICD-10-CM | POA: Diagnosis not present

## 2020-09-05 DIAGNOSIS — R350 Frequency of micturition: Secondary | ICD-10-CM | POA: Diagnosis not present

## 2020-09-05 DIAGNOSIS — N3946 Mixed incontinence: Secondary | ICD-10-CM | POA: Diagnosis not present

## 2020-09-05 DIAGNOSIS — R351 Nocturia: Secondary | ICD-10-CM | POA: Diagnosis not present

## 2020-09-13 DIAGNOSIS — N3946 Mixed incontinence: Secondary | ICD-10-CM | POA: Diagnosis not present

## 2020-09-13 DIAGNOSIS — R351 Nocturia: Secondary | ICD-10-CM | POA: Diagnosis not present

## 2020-09-26 ENCOUNTER — Encounter: Payer: Self-pay | Admitting: Orthopaedic Surgery

## 2020-09-26 ENCOUNTER — Ambulatory Visit (INDEPENDENT_AMBULATORY_CARE_PROVIDER_SITE_OTHER): Payer: Medicare Other

## 2020-09-26 ENCOUNTER — Ambulatory Visit (INDEPENDENT_AMBULATORY_CARE_PROVIDER_SITE_OTHER): Payer: Medicare Other | Admitting: Orthopaedic Surgery

## 2020-09-26 ENCOUNTER — Other Ambulatory Visit: Payer: Self-pay

## 2020-09-26 VITALS — BP 128/74 | HR 76 | Ht 65.0 in | Wt 175.0 lb

## 2020-09-26 DIAGNOSIS — M79605 Pain in left leg: Secondary | ICD-10-CM

## 2020-09-26 DIAGNOSIS — M545 Low back pain, unspecified: Secondary | ICD-10-CM

## 2020-09-27 NOTE — Progress Notes (Signed)
Office Visit Note   Patient: Emma Lewis           Date of Birth: 03-Apr-1951           MRN: [id number] Visit Date: 09/26/2020              Requested by: Philmore Pali, NP [address] [place],  [place] [address] PCP: Philmore Pali, NP   Assessment & Plan: Visit Diagnoses:  1. Pain in left leg   2. Acute bilateral low back pain, unspecified whether sciatica present     Plan: New superior endplate fracture L2.  She is using ibuprofen using a brace.  Recheck 1 month.  AP lateral lumbar spine x-ray on return.  Continue calcium, vitamin D, Fosamax.  Follow-Up Instructions: No follow-ups on file.   Orders:  Orders Placed This Encounter  Procedures  . XR Lumbar Spine 2-3 Views  . XR FEMUR MIN 2 VIEWS LEFT   No orders of the defined types were placed in this encounter.     Procedures: No procedures performed   Clinical Data: No additional findings.   Subjective: Chief Complaint  Patient presents with  . Lower Back - Pain  . Left Leg - Pain    HPI 70 year old female states she has pain in her back is wearing her back brace currently had sudden onset with pain that radiates into the anterior left thigh that began when she got up just over a week ago on 09/16/2020.  She was not doing any lifting.  After stood up she had increased pain.  She is concerned about her hip revision on the left since her pain is in her anterior thigh.  She has been seeing urologist since she is having problems with urinary frequency and leakage problems.  Past history of vertebroplasty treatments L3-L4-L5.  Old compression fractures T11-T12.  Review of Systems: Systems noncontributory HPI.   Objective: Vital Signs: BP 128/74   Pulse 76   Ht 5\' 5"  (1.651 m)   Wt 175 lb (79.4 kg)   BMI 29.12 kg/m   Physical Exam Constitutional:      Appearance: She is well-developed.  HENT:     Head: Normocephalic.     Right Ear: External ear normal.     Left Ear: External ear normal.  Eyes:     Pupils:  Pupils are equal, round, and reactive to light.  Neck:     Thyroid: No thyromegaly.     Trachea: No tracheal deviation.  Cardiovascular:     Rate and Rhythm: Normal rate.  Pulmonary:     Effort: Pulmonary effort is normal.  Abdominal:     Palpations: Abdomen is soft.  Skin:    General: Skin is warm and dry.  Neurological:     Mental Status: She is alert and oriented to person, place, and time.  Psychiatric:        Mood and Affect: Mood and affect normal.        Behavior: Behavior normal.     Ortho Exam patient has some decreased sensation of the anterior left thigh mild tenderness over the trochanter no sciatic notch tenderness.  Left hip incisions well-healed.  No pedal edema. Specialty Comments:  No specialty comments available.  Imaging: XR FEMUR MIN 2 VIEWS LEFT  Result Date: 09/27/2020 AP lateral left femur obtained and reviewed.  This shows revision cemented stem with long lateral plate.  No loosening or subsidence.  Acetabulum is in good position. Impression: Post  hip revision.  With plate unchanged.  XR Lumbar Spine 2-3 Views  Result Date: 09/27/2020 AP lateral lumbar spine x-rays are obtained and reviewed.  Previous cement fixation L3-L4-L5 unchanged.  New superior endplate fracture L2.  Again noted old T11-T12 compression fractures. Impression: New L2 superior endplate fracture without retropulsion.    PMFS History: Patient Active Problem List   Diagnosis Date Noted  . Unilateral primary osteoarthritis, left knee 12/10/2019  . Thoracic compression fracture ([place]) 12/11/2018  . Symptomatic anemia   . Ambulatory dysfunction 07/21/2018  . Weakness of both legs 07/21/2018  . Proteus mirabilis infection   . Hip hematoma, left 06/19/2018  . Hematoma of left hip 06/19/2018  . Hip dislocation, left ([place]) 05/29/2018  . Peri-prosthetic femur fracture at tip of prosthesis 05/29/2018  . Prediabetes   . Popping sound of knee joint   . Hypoalbuminemia due to protein-calorie  malnutrition ([facility])   . Transaminitis   . Leukocytosis   . Postoperative pain   . 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 Acute blood loss anemia   . Leukemoid reaction   . 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 Joint effusion of pelvis or thigh, left 04/22/2018  . Trochanteric bursitis, left hip 04/22/2018  . History of revision of total hip arthroplasty 11/12/2017  . GERD (gastroesophageal reflux disease) 01/17/2017  . Hypothyroidism 01/17/2017  . Vertigo 01/16/2017  . Nausea and vomiting 01/16/2017  . Essential hypertension 01/16/2017  . Fatty liver 01/16/2017  . Failed total hip arthroplasty ([place]) 06/10/2016  . Pelvic mass 06/15/2015  . Closed wedge compression fracture of fifth lumbar vertebra ([place])   . Cervical spondylosis 02/09/2014  . DDD (degenerative disc disease), lumbar 05/13/2013  . OA (osteoarthritis) 05/13/2013   Past Medical History:  Diagnosis Date  . Allergies   . Arthritis    "all over my body" (04/29/2018)  . Chronic lower back pain   . Fatty liver   . Fever blister    Takes Acyclovir  . GERD (gastroesophageal reflux disease)   . Hyperlipemia   . Hypothyroidism   . Migraines   . Osteopenia   . Pneumonia    "once; years ago" (04/29/2018)  . PONV (postoperative nausea and vomiting)    "dry heaves"  . Pre-diabetes   . Seasonal allergies   . Vertigo     Family History  Problem Relation Age of Onset  . Lymphoma Mother     Past Surgical History:  Procedure Laterality Date  . ANTERIOR CERVICAL DECOMP/DISCECTOMY FUSION N/A 02/09/2014   Procedure: ANTERIOR CERVICAL DECOMPRESSION/DISCECTOMY FUSION 2 LEVELS;  Surgeon: [name], MD;  Location: [facility];  Service: Orthopedics;  Laterality: N/A;  C4-5, C5-6 Anterior Cervical Discectomy and Fusion, Allograft, Plate  . BACK SURGERY    . CARPAL TUNNEL RELEASE Right 2018  . CATARACT EXTRACTION W/ INTRAOCULAR LENS  IMPLANT, BILATERAL    . COLONOSCOPY    . FIXATION KYPHOPLASTY LUMBAR  SPINE  X 2  . HIP ARTHROPLASTY Left 2011  . INCISION AND DRAINAGE HIP Left 06/19/2018   Procedure: IRRIGATION AND DEBRIDEMENT LEFT HIP HEMATOMA ANTIBIOTIC BEADS, APPLICATION ON WOUND VAC;  Surgeon: [name], MD;  Location: [facility];  Service: Orthopedics;  Laterality: Left;  . JOINT REPLACEMENT    . KNEE ARTHROSCOPY Right   . ORIF PERIPROSTHETIC FRACTURE Left 06/01/2018   Procedure: OPEN REDUCTION INTERNAL FIXATION (ORIF) PERIPROSTHETIC FEMUR FRACTURE;  Surgeon: [name], MD;  Location: [facility];  Service: Orthopedics;  Laterality: Left;  . PATELLA FRACTURE SURGERY Right ~ 1990   "put metal in"  . PATELLA HARDWARE REMOVAL Right    "took the metal out"  . REVISION TOTAL HIP ARTHROPLASTY Left 04/29/2018  . ROBOTIC ASSISTED BILATERAL SALPINGO OOPHERECTOMY Bilateral 08/01/2015   Procedure: ROBOTIC ASSISTED BILATERAL SALPINGO OOPHORECTOMY;  Surgeon: Everitt Amber, MD;  [facility];  Service: Gynecology;  Laterality: Bilateral;  . SHOULDER OPEN ROTATOR CUFF REPAIR Right    3 TOTAL  . SHOULDER SURGERY Right   . TOTAL HIP ARTHROPLASTY Right 01/21/2018   Procedure: RIGHT TOTAL HIP ARTHROPLASTY DIRECT ANTERIOR;  Surgeon: [name], MD;  Location: [facility];  Service: Orthopedics;  Laterality: Right;  . TOTAL HIP REVISION Left 06/10/2016   Procedure: TOTAL HIP REVISION ARTHROPLASTY;  Surgeon: Rod Can, MD;  Location: [facility];  Service: Orthopedics;  Laterality: Left;  . TOTAL HIP REVISION Left 04/29/2018   Procedure: left total hip arthroplasty revision posterior approach;  Surgeon: [name], MD;  Location: [facility];  Service: Orthopedics;  Laterality: Left;  . TOTAL HIP REVISION Left 09/21/2018   Procedure: left total hip arthroplasty femoral revision;  Surgeon: [name], MD;  Location: [facility];  Service: Orthopedics;  Laterality: Left;  . WRIST GANGLION EXCISION Left 1970s   Social History   Occupational History  . Not on file  Tobacco Use  . Smoking status: Never Smoker  .  Smokeless tobacco: Never Used  Vaping Use  . Vaping Use: Never used  Substance and Sexual Activity  . Alcohol use: Not Currently    Comment: `  . Drug use: Never  . Sexual activity: Not Currently

## 2020-10-19 DIAGNOSIS — N3946 Mixed incontinence: Secondary | ICD-10-CM | POA: Diagnosis not present

## 2020-10-24 ENCOUNTER — Ambulatory Visit (INDEPENDENT_AMBULATORY_CARE_PROVIDER_SITE_OTHER): Payer: Medicare Other | Admitting: Orthopaedic Surgery

## 2020-10-24 ENCOUNTER — Ambulatory Visit (INDEPENDENT_AMBULATORY_CARE_PROVIDER_SITE_OTHER): Payer: Medicare Other

## 2020-10-24 ENCOUNTER — Encounter: Payer: Self-pay | Admitting: Orthopaedic Surgery

## 2020-10-24 ENCOUNTER — Other Ambulatory Visit: Payer: Self-pay

## 2020-10-24 VITALS — BP 139/80 | HR 81 | Ht 65.0 in | Wt 175.0 lb

## 2020-10-24 DIAGNOSIS — M545 Low back pain, unspecified: Secondary | ICD-10-CM | POA: Diagnosis not present

## 2020-10-24 NOTE — Progress Notes (Signed)
Office Visit Note   Patient: Emma Lewis           Date of Birth: 1951-06-19           MRN: [id number] Visit Date: 10/24/2020              Requested by: Philmore Pali, NP [address] [place],  [place] [address] PCP: Philmore Pali, NP   Assessment & Plan: Visit Diagnoses:  1. Acute bilateral low back pain, unspecified whether sciatica present     Plan: L2 superior endplate fracture stable.  History of multiple compression fractures thoracic and lumbar.  She has been on Fosamax for 18 months is is restarting it in July after a 80-month hiatus.  Continuing calcium and vitamin D.  Repeat lumbar x-rays AP lateral and 2 months.  Follow-Up Instructions: No follow-ups on file.   Orders:  Orders Placed This Encounter  Procedures  . XR Lumbar Spine 2-3 Views   No orders of the defined types were placed in this encounter.     Procedures: No procedures performed   Clinical Data: No additional findings.   Subjective: Chief Complaint  Patient presents with  . Lower Back - Fracture, Follow-up    HPI follow-up L2 compression fracture.  She has had some days where she had significant pain had take Norco tablet.  She also took some ibuprofen.  She continues to have pain in her back tends to radiate into her left thigh sometimes in her knee where she has some arthritis.  She is using a walker when she walks outside uses a cane today.  Previous T11-T12 compression fractures.  Cement augmentation L3-L4-L5.  Metallosis total of arthroplasty on the left with revision, postop infection, antibiotic cement treatment revision followed by later periprosthetic fracture with long lateral plate almost to the knee.  Review of Systems all other systems noncontributory to HPI.   Objective: Vital Signs: BP 139/80   Pulse 81   Ht 5\' 5"  (1.651 m)   Wt 175 lb (79.4 kg)   BMI 29.12 kg/m   Physical Exam Constitutional:      Appearance: She is well-developed.  HENT:     Head: Normocephalic.      Right Ear: External ear normal.     Left Ear: External ear normal.  Eyes:     Pupils: Pupils are equal, round, and reactive to light.  Neck:     Thyroid: No thyromegaly.     Trachea: No tracheal deviation.  Cardiovascular:     Rate and Rhythm: Normal rate.  Pulmonary:     Effort: Pulmonary effort is normal.  Abdominal:     Palpations: Abdomen is soft.  Skin:    General: Skin is warm and dry.  Neurological:     Mental Status: She is alert and oriented to person, place, and time.  Psychiatric:        Behavior: Behavior normal.     Ortho Exam patient ambulates with a cane.  No sharp pain with internal or external rotation right or left hip.  Some stiffness left knee trace effusion.  Collateral ligaments are stable.  Specialty Comments:  No specialty comments available.  Imaging: No results found.   PMFS History: Patient Active Problem List   Diagnosis Date Noted  . Unilateral primary osteoarthritis, left knee 12/10/2019  . Thoracic compression fracture ([place]) 12/11/2018  . Symptomatic anemia   . Ambulatory dysfunction 07/21/2018  . Weakness of both legs 07/21/2018  . Proteus mirabilis  infection   . Hip hematoma, left 06/19/2018  . Hematoma of left hip 06/19/2018  . Hip dislocation, left ([place]) 05/29/2018  . Peri-prosthetic femur fracture at tip of prosthesis 05/29/2018  . Prediabetes   . Popping sound of knee joint   . Hypoalbuminemia due to protein-calorie malnutrition ([place])   . Transaminitis   . Leukocytosis   . Postoperative pain   . 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 Acute blood loss anemia   . Leukemoid reaction   . 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 Joint effusion of pelvis or thigh, left 04/22/2018  . Trochanteric bursitis, left hip 04/22/2018  . History of revision of total hip arthroplasty 11/12/2017  . GERD (gastroesophageal reflux disease) 01/17/2017  . Hypothyroidism 01/17/2017  . Vertigo 01/16/2017  . Nausea and  vomiting 01/16/2017  . Essential hypertension 01/16/2017  . Fatty liver 01/16/2017  . Failed total hip arthroplasty ([facility]) 06/10/2016  . Pelvic mass 06/15/2015  . Closed wedge compression fracture of fifth lumbar vertebra ([place])   . Cervical spondylosis 02/09/2014  . DDD (degenerative disc disease), lumbar 05/13/2013  . OA (osteoarthritis) 05/13/2013   Past Medical History:  Diagnosis Date  . Allergies   . Arthritis    "all over my body" (04/29/2018)  . Chronic lower back pain   . Fatty liver   . Fever blister    Takes Acyclovir  . GERD (gastroesophageal reflux disease)   . Hyperlipemia   . Hypothyroidism   . Migraines   . Osteopenia   . Pneumonia    "once; years ago" (04/29/2018)  . PONV (postoperative nausea and vomiting)    "dry heaves"  . Pre-diabetes   . Seasonal allergies   . Vertigo     Family History  Problem Relation Age of Onset  . Lymphoma Mother     Past Surgical History:  Procedure Laterality Date  . ANTERIOR CERVICAL DECOMP/DISCECTOMY FUSION N/A 02/09/2014   Procedure: ANTERIOR CERVICAL DECOMPRESSION/DISCECTOMY FUSION 2 LEVELS;  Surgeon: [name], MD;  Location: [facility];  Service: Orthopedics;  Laterality: N/A;  C4-5, C5-6 Anterior Cervical Discectomy and Fusion, Allograft, Plate  . BACK SURGERY    . CARPAL TUNNEL RELEASE Right 2018  . CATARACT EXTRACTION W/ INTRAOCULAR LENS  IMPLANT, BILATERAL    . COLONOSCOPY    . FIXATION KYPHOPLASTY LUMBAR SPINE  X 2  . HIP ARTHROPLASTY Left 2011  . INCISION AND DRAINAGE HIP Left 06/19/2018   Procedure: IRRIGATION AND DEBRIDEMENT LEFT HIP HEMATOMA ANTIBIOTIC BEADS, APPLICATION ON WOUND VAC;  Surgeon: [name], MD;  Location: [facility];  Service: Orthopedics;  Laterality: Left;  . JOINT REPLACEMENT    . KNEE ARTHROSCOPY Right   . ORIF PERIPROSTHETIC FRACTURE Left 06/01/2018   Procedure: OPEN REDUCTION INTERNAL FIXATION (ORIF) PERIPROSTHETIC FEMUR FRACTURE;  Surgeon: [name], MD;  Location: [facility];  Service:  Orthopedics;  Laterality: Left;  . PATELLA FRACTURE SURGERY Right ~ 1990   "put metal in"  . PATELLA HARDWARE REMOVAL Right    "took the metal out"  . REVISION TOTAL HIP ARTHROPLASTY Left 04/29/2018  . ROBOTIC ASSISTED BILATERAL SALPINGO OOPHERECTOMY Bilateral 08/01/2015   Procedure: ROBOTIC ASSISTED BILATERAL SALPINGO OOPHORECTOMY;  Surgeon: Everitt Amber, MD;  [facility];  Service: Gynecology;  Laterality: Bilateral;  . SHOULDER OPEN ROTATOR CUFF REPAIR Right    3 TOTAL  . SHOULDER SURGERY Right   . TOTAL HIP ARTHROPLASTY Right 01/21/2018   Procedure: RIGHT TOTAL HIP ARTHROPLASTY DIRECT  ANTERIOR;  Surgeon: [name], MD;  Location: [facility];  Service: Orthopedics;  Laterality: Right;  . TOTAL HIP REVISION Left 06/10/2016   Procedure: TOTAL HIP REVISION ARTHROPLASTY;  Surgeon: Rod Can, MD;  Location: [facility];  Service: Orthopedics;  Laterality: Left;  . TOTAL HIP REVISION Left 04/29/2018   Procedure: left total hip arthroplasty revision posterior approach;  Surgeon: [name], MD;  Location: [facility];  Service: Orthopedics;  Laterality: Left;  . TOTAL HIP REVISION Left 09/21/2018   Procedure: left total hip arthroplasty femoral revision;  Surgeon: [name], MD;  Location: [facility];  Service: Orthopedics;  Laterality: Left;  . WRIST GANGLION EXCISION Left 1970s   Social History   Occupational History  . Not on file  Tobacco Use  . Smoking status: Never Smoker  . Smokeless tobacco: Never Used  Vaping Use  . Vaping Use: Never used  Substance and Sexual Activity  . Alcohol use: Not Currently    Comment: `  . Drug use: Never  . Sexual activity: Not Currently

## 2020-10-29 DIAGNOSIS — K7460 Unspecified cirrhosis of liver: Secondary | ICD-10-CM | POA: Diagnosis not present

## 2020-10-29 DIAGNOSIS — K5730 Diverticulosis of large intestine without perforation or abscess without bleeding: Secondary | ICD-10-CM | POA: Diagnosis not present

## 2020-10-29 DIAGNOSIS — Z7983 Long term (current) use of bisphosphonates: Secondary | ICD-10-CM | POA: Diagnosis not present

## 2020-10-29 DIAGNOSIS — Z79899 Other long term (current) drug therapy: Secondary | ICD-10-CM | POA: Diagnosis not present

## 2020-10-29 DIAGNOSIS — N390 Urinary tract infection, site not specified: Secondary | ICD-10-CM | POA: Diagnosis not present

## 2020-10-29 DIAGNOSIS — I700 Atherosclerosis of aorta: Secondary | ICD-10-CM | POA: Diagnosis not present

## 2020-10-29 DIAGNOSIS — Z20822 Contact with and (suspected) exposure to covid-19: Secondary | ICD-10-CM | POA: Diagnosis not present

## 2020-10-29 DIAGNOSIS — E785 Hyperlipidemia, unspecified: Secondary | ICD-10-CM | POA: Diagnosis not present

## 2020-10-29 DIAGNOSIS — E039 Hypothyroidism, unspecified: Secondary | ICD-10-CM | POA: Diagnosis not present

## 2020-10-29 DIAGNOSIS — K760 Fatty (change of) liver, not elsewhere classified: Secondary | ICD-10-CM | POA: Diagnosis not present

## 2020-10-29 DIAGNOSIS — Z90722 Acquired absence of ovaries, bilateral: Secondary | ICD-10-CM | POA: Diagnosis not present

## 2020-10-29 DIAGNOSIS — M479 Spondylosis, unspecified: Secondary | ICD-10-CM | POA: Diagnosis not present

## 2020-10-29 DIAGNOSIS — Z79890 Hormone replacement therapy (postmenopausal): Secondary | ICD-10-CM | POA: Diagnosis not present

## 2020-10-29 DIAGNOSIS — M1991 Primary osteoarthritis, unspecified site: Secondary | ICD-10-CM | POA: Diagnosis not present

## 2020-10-29 DIAGNOSIS — N132 Hydronephrosis with renal and ureteral calculous obstruction: Secondary | ICD-10-CM | POA: Diagnosis not present

## 2020-10-29 DIAGNOSIS — N200 Calculus of kidney: Secondary | ICD-10-CM | POA: Diagnosis not present

## 2020-10-29 DIAGNOSIS — M4856XA Collapsed vertebra, not elsewhere classified, lumbar region, initial encounter for fracture: Secondary | ICD-10-CM | POA: Diagnosis not present

## 2020-10-29 DIAGNOSIS — N1330 Unspecified hydronephrosis: Secondary | ICD-10-CM | POA: Diagnosis not present

## 2020-10-29 DIAGNOSIS — Z96642 Presence of left artificial hip joint: Secondary | ICD-10-CM | POA: Diagnosis not present

## 2020-10-29 DIAGNOSIS — K5750 Diverticulosis of both small and large intestine without perforation or abscess without bleeding: Secondary | ICD-10-CM | POA: Diagnosis not present

## 2020-10-29 DIAGNOSIS — R32 Unspecified urinary incontinence: Secondary | ICD-10-CM | POA: Diagnosis not present

## 2020-10-29 DIAGNOSIS — Z882 Allergy status to sulfonamides status: Secondary | ICD-10-CM | POA: Diagnosis not present

## 2020-11-02 DIAGNOSIS — N201 Calculus of ureter: Secondary | ICD-10-CM | POA: Diagnosis not present

## 2020-11-02 DIAGNOSIS — N3946 Mixed incontinence: Secondary | ICD-10-CM | POA: Diagnosis not present

## 2020-11-02 DIAGNOSIS — R8279 Other abnormal findings on microbiological examination of urine: Secondary | ICD-10-CM | POA: Diagnosis not present

## 2020-11-04 ENCOUNTER — Other Ambulatory Visit: Payer: Self-pay | Admitting: Orthopaedic Surgery

## 2020-11-06 MED ORDER — HYDROCODONE-ACETAMINOPHEN 5-325 MG PO TABS: 1.0000 | ORAL_TABLET | Freq: Four times a day (QID) | ORAL | 0 refills | Status: DC | PRN

## 2020-11-09 DIAGNOSIS — N201 Calculus of ureter: Secondary | ICD-10-CM | POA: Diagnosis not present

## 2020-11-27 DIAGNOSIS — N3000 Acute cystitis without hematuria: Secondary | ICD-10-CM | POA: Diagnosis not present

## 2020-11-27 DIAGNOSIS — N200 Calculus of kidney: Secondary | ICD-10-CM | POA: Diagnosis not present

## 2020-11-27 DIAGNOSIS — E278 Other specified disorders of adrenal gland: Secondary | ICD-10-CM | POA: Diagnosis not present

## 2020-11-27 DIAGNOSIS — R8271 Bacteriuria: Secondary | ICD-10-CM | POA: Diagnosis not present

## 2020-11-27 DIAGNOSIS — M810 Age-related osteoporosis without current pathological fracture: Secondary | ICD-10-CM | POA: Diagnosis not present

## 2020-11-27 DIAGNOSIS — R3129 Other microscopic hematuria: Secondary | ICD-10-CM | POA: Diagnosis not present

## 2020-11-27 DIAGNOSIS — N201 Calculus of ureter: Secondary | ICD-10-CM | POA: Diagnosis not present

## 2020-12-21 DIAGNOSIS — N201 Calculus of ureter: Secondary | ICD-10-CM | POA: Diagnosis not present

## 2020-12-22 DIAGNOSIS — N201 Calculus of ureter: Secondary | ICD-10-CM | POA: Diagnosis not present

## 2020-12-26 ENCOUNTER — Ambulatory Visit (INDEPENDENT_AMBULATORY_CARE_PROVIDER_SITE_OTHER): Payer: Medicare Other

## 2020-12-26 ENCOUNTER — Other Ambulatory Visit: Payer: Self-pay

## 2020-12-26 ENCOUNTER — Encounter: Payer: Self-pay | Admitting: Orthopaedic Surgery

## 2020-12-26 ENCOUNTER — Ambulatory Visit (INDEPENDENT_AMBULATORY_CARE_PROVIDER_SITE_OTHER): Payer: Medicare Other | Admitting: Orthopaedic Surgery

## 2020-12-26 VITALS — BP 127/82 | HR 80 | Ht 65.0 in | Wt 175.0 lb

## 2020-12-26 DIAGNOSIS — M545 Low back pain, unspecified: Secondary | ICD-10-CM

## 2020-12-26 DIAGNOSIS — S32020G Wedge compression fracture of second lumbar vertebra, subsequent encounter for fracture with delayed healing: Secondary | ICD-10-CM | POA: Diagnosis not present

## 2020-12-26 DIAGNOSIS — S32000A Wedge compression fracture of unspecified lumbar vertebra, initial encounter for closed fracture: Secondary | ICD-10-CM | POA: Insufficient documentation

## 2020-12-26 NOTE — Progress Notes (Signed)
Office Visit Note   Patient: Emma Lewis           Date of Birth: [birth year]           MRN: [id number] Visit Date: 12/26/2020              Requested by: Philmore Pali, NP [address] [place],  [place] [address] PCP: Philmore Pali, NP   Assessment & Plan: Visit Diagnoses:  1. Acute bilateral low back pain, unspecified whether sciatica present   2. Compression fracture of L2 vertebra with delayed healing, subsequent encounter     Plan: Patient is back on weekly Fosamax she is also on calcium and vitamin D.  We will have her make an appointment with Dr. Wandra Feinstein in the osteoporosis clinic for discussion about possible additional therapy.  She can follow-up with me in 3 months. She is unable to stand longer walk far.  She has had vertebroplasty's ordered and performed elsewhere.  Significant osteoporosis and certainly lumbar spine would be unlikely to handle any hardware without significant risk to failure.  She is already had periprosthetic femur fractures.  She can follow-up with me in 3 months. Follow-Up Instructions: No follow-ups on file.   Orders:  Orders Placed This Encounter  Procedures  . XR Lumbar Spine 2-3 Views  . Ambulatory referral to Orthopedic Surgery   No orders of the defined types were placed in this encounter.     Procedures: No procedures performed   Clinical Data: No additional findings.   Subjective: Chief Complaint  Patient presents with  . Lower Back - Follow-up, Fracture    HPI 70 year old femaleWith past history of kidney stones states she went for ultrasound and then CT abdomen and pelvis and as they were getting her off the table they pulled her up with multiple people and she states she had sharp increase in her back.  CT scan stone study done for 3/22 showed 5 mm recently passed right renal calculus stone with mild right hydronephrosis.  L2 compression fracture.  She continues to have pain that radiates into her left thigh with left quad  weakness and left hip flexion weakness.  Review of Systems all other systems noncontributory to HPI.   Objective: Vital Signs: BP 127/82   Pulse 80   Ht 5\' 5"  (1.651 m)   Wt 175 lb (79.4 kg)   BMI 29.12 kg/m   Physical Exam Constitutional:      Appearance: She is well-developed.  HENT:     Head: Normocephalic.     Right Ear: External ear normal.     Left Ear: External ear normal.  Eyes:     Pupils: Pupils are equal, round, and reactive to light.  Neck:     Thyroid: No thyromegaly.     Trachea: No tracheal deviation.  Cardiovascular:     Rate and Rhythm: Normal rate.  Pulmonary:     Effort: Pulmonary effort is normal.  Abdominal:     Palpations: Abdomen is soft.  Skin:    General: Skin is warm and dry.  Neurological:     Mental Status: She is alert and oriented to person, place, and time.  Psychiatric:        Behavior: Behavior normal.     Ortho Exam patient is ambulatory with a cane.  10unit left anterior thigh decreased sensation with some quad and hip flexor weakness on the left. Specialty Comments:  No specialty comments available.  Imaging: XR Lumbar Spine  2-3 Views  Result Date: 12/26/2020 AP lateral lumbar spine x-rays are obtained and reviewed.  This shows previous total of arthroplasties.  Comparison to 10/24/2020 images shows slight further central compression at L2.  Previous vertebroplasty's at L3, L4 and L5 unchanged.  Remote old T12 compression fracture unchanged. Impression: Slight further endplate compression at L-2 centrally.  Previous vertebroplasty's/kyphoplasty as described above.    PMFS History: Patient Active Problem List   Diagnosis Date Noted  . Lumbar compression fracture ([facility]) 12/26/2020  . Unilateral primary osteoarthritis, left knee 12/10/2019  . Thoracic compression fracture ([facility]) 12/11/2018  . Symptomatic anemia   . Ambulatory dysfunction 07/21/2018  . Weakness of both legs 07/21/2018  . Proteus mirabilis infection   . Hip  hematoma, left 06/19/2018  . Hematoma of left hip 06/19/2018  . Hip dislocation, left ([facility]) 05/29/2018  . Peri-prosthetic femur fracture at tip of prosthesis 05/29/2018  . Prediabetes   . Popping sound of knee joint   . Hypoalbuminemia due to protein-calorie malnutrition ([facility])   . Transaminitis   . Leukocytosis   . Postoperative pain   . 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 Acute blood loss anemia   . Leukemoid reaction   . 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 Joint effusion of pelvis or thigh, left 04/22/2018  . Trochanteric bursitis, left hip 04/22/2018  . History of revision of total hip arthroplasty 11/12/2017  . GERD (gastroesophageal reflux disease) 01/17/2017  . Hypothyroidism 01/17/2017  . Vertigo 01/16/2017  . Nausea and vomiting 01/16/2017  . Essential hypertension 01/16/2017  . Fatty liver 01/16/2017  . Failed total hip arthroplasty ([facility]) 06/10/2016  . Pelvic mass 06/15/2015  . Closed wedge compression fracture of fifth lumbar vertebra ([facility])   . Cervical spondylosis 02/09/2014  . DDD (degenerative disc disease), lumbar 05/13/2013  . OA (osteoarthritis) 05/13/2013   Past Medical History:  Diagnosis Date  . Allergies   . Arthritis    "all over my body" (04/29/2018)  . Chronic lower back pain   . Fatty liver   . Fever blister    Takes Acyclovir  . GERD (gastroesophageal reflux disease)   . Hyperlipemia   . Hypothyroidism   . Migraines   . Osteopenia   . Pneumonia    "once; years ago" (04/29/2018)  . PONV (postoperative nausea and vomiting)    "dry heaves"  . Pre-diabetes   . Seasonal allergies   . Vertigo     Family History  Problem Relation Age of Onset  . Lymphoma Mother     Past Surgical History:  Procedure Laterality Date  . ANTERIOR CERVICAL DECOMP/DISCECTOMY FUSION N/A 02/09/2014   Procedure: ANTERIOR CERVICAL DECOMPRESSION/DISCECTOMY FUSION 2 LEVELS;  Surgeon: [name], MD;  Location: [facility];  Service:  Orthopedics;  Laterality: N/A;  C4-5, C5-6 Anterior Cervical Discectomy and Fusion, Allograft, Plate  . BACK SURGERY    . CARPAL TUNNEL RELEASE Right 2018  . CATARACT EXTRACTION W/ INTRAOCULAR LENS  IMPLANT, BILATERAL    . COLONOSCOPY    . FIXATION KYPHOPLASTY LUMBAR SPINE  X 2  . HIP ARTHROPLASTY Left 2011  . INCISION AND DRAINAGE HIP Left 06/19/2018   Procedure: IRRIGATION AND DEBRIDEMENT LEFT HIP HEMATOMA ANTIBIOTIC BEADS, APPLICATION ON WOUND VAC;  Surgeon: [name], MD;  Location: [facility];  Service: Orthopedics;  Laterality: Left;  . JOINT REPLACEMENT    . KNEE ARTHROSCOPY Right   . ORIF PERIPROSTHETIC FRACTURE Left 06/01/2018   Procedure: OPEN REDUCTION INTERNAL  FIXATION (ORIF) PERIPROSTHETIC FEMUR FRACTURE;  Surgeon: [name], MD;  Location: [facility];  Service: Orthopedics;  Laterality: Left;  . PATELLA FRACTURE SURGERY Right ~ 1990   "put metal in"  . PATELLA HARDWARE REMOVAL Right    "took the metal out"  . REVISION TOTAL HIP ARTHROPLASTY Left 04/29/2018  . ROBOTIC ASSISTED BILATERAL SALPINGO OOPHERECTOMY Bilateral 08/01/2015   Procedure: ROBOTIC ASSISTED BILATERAL SALPINGO OOPHORECTOMY;  Surgeon: Everitt Amber, MD;  Location: [facility];  Service: Gynecology;  Laterality: Bilateral;  . SHOULDER OPEN ROTATOR CUFF REPAIR Right    3 TOTAL  . SHOULDER SURGERY Right   . TOTAL HIP ARTHROPLASTY Right 01/21/2018   Procedure: RIGHT TOTAL HIP ARTHROPLASTY DIRECT ANTERIOR;  Surgeon: [name], MD;  Location: [facility];  Service: Orthopedics;  Laterality: Right;  . TOTAL HIP REVISION Left 06/10/2016   Procedure: TOTAL HIP REVISION ARTHROPLASTY;  Surgeon: Rod Can, MD;  Location: [facility];  Service: Orthopedics;  Laterality: Left;  . TOTAL HIP REVISION Left 04/29/2018   Procedure: left total hip arthroplasty revision posterior approach;  Surgeon: [name], MD;  Location: [facility];  Service: Orthopedics;  Laterality: Left;  . TOTAL HIP REVISION Left 09/21/2018   Procedure: left total hip  arthroplasty femoral revision;  Surgeon: [name], MD;  Location: [facility];  Service: Orthopedics;  Laterality: Left;  . WRIST GANGLION EXCISION Left 1970s   Social History   Occupational History  . Not on file  Tobacco Use  . Smoking status: Never Smoker  . Smokeless tobacco: Never Used  Vaping Use  . Vaping Use: Never used  Substance and Sexual Activity  . Alcohol use: Not Currently    Comment: `  . Drug use: Never  . Sexual activity: Not Currently

## 2021-01-04 DIAGNOSIS — E559 Vitamin D deficiency, unspecified: Secondary | ICD-10-CM | POA: Diagnosis not present

## 2021-01-04 DIAGNOSIS — M545 Low back pain, unspecified: Secondary | ICD-10-CM | POA: Diagnosis not present

## 2021-01-04 DIAGNOSIS — K760 Fatty (change of) liver, not elsewhere classified: Secondary | ICD-10-CM | POA: Diagnosis not present

## 2021-01-04 DIAGNOSIS — R7303 Prediabetes: Secondary | ICD-10-CM | POA: Diagnosis not present

## 2021-01-04 DIAGNOSIS — Z87310 Personal history of (healed) osteoporosis fracture: Secondary | ICD-10-CM | POA: Diagnosis not present

## 2021-01-04 DIAGNOSIS — E039 Hypothyroidism, unspecified: Secondary | ICD-10-CM | POA: Diagnosis not present

## 2021-01-04 DIAGNOSIS — I10 Essential (primary) hypertension: Secondary | ICD-10-CM | POA: Diagnosis not present

## 2021-01-04 DIAGNOSIS — D649 Anemia, unspecified: Secondary | ICD-10-CM | POA: Diagnosis not present

## 2021-01-04 DIAGNOSIS — E785 Hyperlipidemia, unspecified: Secondary | ICD-10-CM | POA: Diagnosis not present

## 2021-01-04 DIAGNOSIS — Z139 Encounter for screening, unspecified: Secondary | ICD-10-CM | POA: Diagnosis not present

## 2021-01-04 DIAGNOSIS — Z6830 Body mass index (BMI) 30.0-30.9, adult: Secondary | ICD-10-CM | POA: Diagnosis not present

## 2021-01-04 DIAGNOSIS — G8929 Other chronic pain: Secondary | ICD-10-CM | POA: Diagnosis not present

## 2021-01-12 DIAGNOSIS — N132 Hydronephrosis with renal and ureteral calculous obstruction: Secondary | ICD-10-CM | POA: Diagnosis not present

## 2021-01-12 DIAGNOSIS — N201 Calculus of ureter: Secondary | ICD-10-CM | POA: Diagnosis not present

## 2021-01-17 ENCOUNTER — Telehealth: Payer: Self-pay

## 2021-01-17 NOTE — Telephone Encounter (Signed)
I called and spoke with patient. She still has not heard anything in regards to referral to Dr. Layne Benton. I advised you had sent referral over on 01/01/2021.  Could you please check on this? Thanks.

## 2021-01-17 NOTE — Telephone Encounter (Signed)
Patient called she stated she has a question to ask, she is requesting a call [phone]

## 2021-01-19 DIAGNOSIS — N201 Calculus of ureter: Secondary | ICD-10-CM | POA: Diagnosis not present

## 2021-01-19 NOTE — Telephone Encounter (Signed)
Refaxed referral to [facility], I sent referral first time to wrong number.

## 2021-02-08 DIAGNOSIS — M8580 Other specified disorders of bone density and structure, unspecified site: Secondary | ICD-10-CM | POA: Diagnosis not present

## 2021-02-23 DIAGNOSIS — N3946 Mixed incontinence: Secondary | ICD-10-CM | POA: Diagnosis not present

## 2021-02-23 DIAGNOSIS — N201 Calculus of ureter: Secondary | ICD-10-CM | POA: Diagnosis not present

## 2021-02-27 DIAGNOSIS — M810 Age-related osteoporosis without current pathological fracture: Secondary | ICD-10-CM | POA: Diagnosis not present

## 2021-02-27 DIAGNOSIS — M545 Low back pain, unspecified: Secondary | ICD-10-CM | POA: Diagnosis not present

## 2021-02-27 DIAGNOSIS — M25552 Pain in left hip: Secondary | ICD-10-CM | POA: Diagnosis not present

## 2021-03-01 DIAGNOSIS — R5383 Other fatigue: Secondary | ICD-10-CM | POA: Diagnosis not present

## 2021-03-01 DIAGNOSIS — M85852 Other specified disorders of bone density and structure, left thigh: Secondary | ICD-10-CM | POA: Diagnosis not present

## 2021-03-01 DIAGNOSIS — E559 Vitamin D deficiency, unspecified: Secondary | ICD-10-CM | POA: Diagnosis not present

## 2021-03-01 DIAGNOSIS — Z780 Asymptomatic menopausal state: Secondary | ICD-10-CM | POA: Diagnosis not present

## 2021-03-01 DIAGNOSIS — M810 Age-related osteoporosis without current pathological fracture: Secondary | ICD-10-CM | POA: Diagnosis not present

## 2021-03-15 DIAGNOSIS — M810 Age-related osteoporosis without current pathological fracture: Secondary | ICD-10-CM | POA: Diagnosis not present

## 2021-03-28 ENCOUNTER — Ambulatory Visit (INDEPENDENT_AMBULATORY_CARE_PROVIDER_SITE_OTHER): Payer: Medicare Other

## 2021-03-28 ENCOUNTER — Other Ambulatory Visit: Payer: Self-pay

## 2021-03-28 ENCOUNTER — Encounter: Payer: Self-pay | Admitting: Orthopaedic Surgery

## 2021-03-28 ENCOUNTER — Ambulatory Visit (INDEPENDENT_AMBULATORY_CARE_PROVIDER_SITE_OTHER): Payer: Medicare Other | Admitting: Orthopaedic Surgery

## 2021-03-28 VITALS — BP 127/77

## 2021-03-28 DIAGNOSIS — Z96649 Presence of unspecified artificial hip joint: Secondary | ICD-10-CM | POA: Diagnosis not present

## 2021-03-28 NOTE — Progress Notes (Signed)
Office Visit Note   Patient: Emma Lewis           Date of Birth: 06/06/1951           MRN: [id number] Visit Date: 03/28/2021              Requested by: Philmore Pali, NP [place],  [place] [address] PCP: Philmore Pali, NP   Assessment & Plan: Visit Diagnoses:  1. History of revision of total hip arthroplasty   2.    Osteopenia with multiple compression fractures lumbar.  Plan: Patient needs to continue to work on quad strengthening on the left leg.  Goal would be for her to be able to ambulate without a cane and decrease limping.  X-rays are stable post revision surgeries.  She has been followed in the osteoporosis clinic.  Bone scan showed osteopenia and she has had documentation of 3 vertebrae's in the lumbar spine.  She will follow-up with me as  Follow-Up Instructions: No follow-ups on file.   Orders:  Orders Placed This Encounter  Procedures   XR FEMUR MIN 2 VIEWS LEFT   No orders of the defined types were placed in this encounter.     Procedures: No procedures performed   Clinical Data: No additional findings.   Subjective: Chief Complaint  Patient presents with   Lower Back - Follow-up    HPI patient returns and is ambulating with a cane.  She had metallosis left hip on a hip that was recalled no longer manufactured.  Postop infection requiring revision with later periprosthetic femur fracture longer stem later lateral plate that extends almost to the knee.  She has quad weakness and is seen with problems with back pain.  Back pain she rates it 4 out of 10.  Previous fractures at L3, L4 and L5 which were augmented.  She seen Dr. Layne Benton and used to be on Fosamax and will be on Prolia.  She is using Advil for pain and still working on leg lifts to help with the weak quad.  Review of Systems positive for osteopenia.  All other systems noncontributory to HPI.   Objective: Vital Signs: BP 127/77 (BP Location: Left Arm, Patient Position: Sitting, Cuff  Size: Normal)   SpO2 96%   Physical Exam Constitutional:      Appearance: She is well-developed.  HENT:     Head: Normocephalic.     Right Ear: External ear normal.     Left Ear: External ear normal. There is no impacted cerumen.  Eyes:     Pupils: Pupils are equal, round, and reactive to light.  Neck:     Thyroid: No thyromegaly.     Trachea: No tracheal deviation.  Cardiovascular:     Rate and Rhythm: Normal rate.  Pulmonary:     Effort: Pulmonary effort is normal.  Abdominal:     Palpations: Abdomen is soft.  Musculoskeletal:     Cervical back: No rigidity.  Skin:    General: Skin is warm and dry.  Neurological:     Mental Status: She is alert and oriented to person, place, and time.  Psychiatric:        Behavior: Behavior normal.    Ortho Exam patient is full extension of her knee flexes to 100 degrees.  Some weakness of her left quad difficulty holding a straight leg raise.  She ambulates with a cane.  Abductor strong.  Specialty Comments:  No specialty comments available.  Imaging:  No results found.   PMFS History: Patient Active Problem List   Diagnosis Date Noted   Lumbar compression fracture ([facility]) 12/26/2020   Unilateral primary osteoarthritis, left knee 12/10/2019   Thoracic compression fracture ([facility]) 12/11/2018   Symptomatic anemia    Ambulatory dysfunction 07/21/2018   Weakness of both legs 07/21/2018   Proteus mirabilis infection    Hip hematoma, left 06/19/2018   Hematoma of left hip 06/19/2018   Hip dislocation, left ([facility]) 05/29/2018   Peri-prosthetic femur fracture at tip of prosthesis 05/29/2018   Prediabetes    Popping sound of knee joint    Hypoalbuminemia due to protein-calorie malnutrition ([facility])    Transaminitis    Leukocytosis    Postoperative pain    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 ([facility]) 05/01/2018   Acute blood loss anemia    Leukemoid reaction    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   Joint effusion of pelvis or  thigh, left 04/22/2018   Trochanteric bursitis, left hip 04/22/2018   History of revision of total hip arthroplasty 11/12/2017   GERD (gastroesophageal reflux disease) 01/17/2017   Hypothyroidism 01/17/2017   Vertigo 01/16/2017   Nausea and vomiting 01/16/2017   Essential hypertension 01/16/2017   Fatty liver 01/16/2017   Failed total hip arthroplasty ([facility]) 06/10/2016   Pelvic mass 06/15/2015   Closed wedge compression fracture of fifth lumbar vertebra ([facility])    Cervical spondylosis 02/09/2014   DDD (degenerative disc disease), lumbar 05/13/2013   OA (osteoarthritis) 05/13/2013   Past Medical History:  Diagnosis Date   Allergies    Arthritis    "all over my body" (04/29/2018)   Chronic lower back pain    Fatty liver    Fever blister    Takes Acyclovir   GERD (gastroesophageal reflux disease)    Hyperlipemia    Hypothyroidism    Migraines    Osteopenia    Pneumonia    "once; years ago" (04/29/2018)   PONV (postoperative nausea and vomiting)    "dry heaves"   Pre-diabetes    Seasonal allergies    Vertigo     Family History  Problem Relation Age of Onset   Lymphoma Mother     Past Surgical History:  Procedure Laterality Date   ANTERIOR CERVICAL DECOMP/DISCECTOMY FUSION N/A 02/09/2014   Procedure: ANTERIOR CERVICAL DECOMPRESSION/DISCECTOMY FUSION 2 LEVELS;  Surgeon: [name], MD;  Location: [facility];  Service: Orthopedics;  Laterality: N/A;  C4-5, C5-6 Anterior Cervical Discectomy and Fusion, Allograft, Plate   BACK SURGERY     CARPAL TUNNEL RELEASE Right 2018   CATARACT EXTRACTION W/ INTRAOCULAR LENS  IMPLANT, BILATERAL     COLONOSCOPY     FIXATION KYPHOPLASTY LUMBAR SPINE  X 2   HIP ARTHROPLASTY Left 2011   INCISION AND DRAINAGE HIP Left 06/19/2018   Procedure: IRRIGATION AND DEBRIDEMENT LEFT HIP HEMATOMA ANTIBIOTIC BEADS, APPLICATION ON WOUND VAC;  Surgeon: [name], MD;  Location: [facility];  Service: Orthopedics;  Laterality: Left;   JOINT REPLACEMENT     KNEE  ARTHROSCOPY Right    ORIF PERIPROSTHETIC FRACTURE Left 06/01/2018   Procedure: OPEN REDUCTION INTERNAL FIXATION (ORIF) PERIPROSTHETIC FEMUR FRACTURE;  Surgeon: [name], MD;  Location: [facility];  Service: Orthopedics;  Laterality: Left;   PATELLA FRACTURE SURGERY Right ~ 1990   "put metal in"   PATELLA HARDWARE REMOVAL Right    "took the metal out"   [place] Left 04/29/2018   ROBOTIC ASSISTED BILATERAL SALPINGO OOPHERECTOMY  Bilateral 08/01/2015   Procedure: ROBOTIC ASSISTED BILATERAL SALPINGO OOPHORECTOMY;  Surgeon: Everitt Amber, MD;  [facility];  Service: Gynecology;  Laterality: Bilateral;   SHOULDER OPEN ROTATOR CUFF REPAIR Right    3 TOTAL   SHOULDER SURGERY Right    TOTAL HIP ARTHROPLASTY Right 01/21/2018   Procedure: RIGHT TOTAL HIP ARTHROPLASTY DIRECT ANTERIOR;  Surgeon: [name], MD;  Location: [facility];  Service: Orthopedics;  Laterality: Right;   TOTAL HIP REVISION Left 06/10/2016   Procedure: TOTAL HIP REVISION ARTHROPLASTY;  Surgeon: Rod Can, MD;  Location: [facility];  Service: Orthopedics;  Laterality: Left;   TOTAL HIP REVISION Left 04/29/2018   Procedure: left total hip arthroplasty revision posterior approach;  Surgeon: [name], MD;  Location: [facility];  Service: Orthopedics;  Laterality: Left;   TOTAL HIP REVISION Left 09/21/2018   Procedure: left total hip arthroplasty femoral revision;  Surgeon: [name], MD;  Location: [facility];  Service: Orthopedics;  Laterality: Left;   WRIST GANGLION EXCISION Left 1970s   Social History   Occupational History   Not on file  Tobacco Use   Smoking status: Never   Smokeless tobacco: Never  Vaping Use   Vaping Use: Never used  Substance and Sexual Activity   Alcohol use: Not Currently    Comment: `   Drug use: Never   Sexual activity: Not Currently

## 2021-04-10 DIAGNOSIS — M810 Age-related osteoporosis without current pathological fracture: Secondary | ICD-10-CM | POA: Diagnosis not present

## 2021-05-01 DIAGNOSIS — E039 Hypothyroidism, unspecified: Secondary | ICD-10-CM | POA: Diagnosis not present

## 2021-05-04 DIAGNOSIS — N3946 Mixed incontinence: Secondary | ICD-10-CM | POA: Diagnosis not present

## 2021-05-04 DIAGNOSIS — N3944 Nocturnal enuresis: Secondary | ICD-10-CM | POA: Diagnosis not present

## 2021-05-09 DIAGNOSIS — R5383 Other fatigue: Secondary | ICD-10-CM | POA: Diagnosis not present

## 2021-05-09 DIAGNOSIS — M810 Age-related osteoporosis without current pathological fracture: Secondary | ICD-10-CM | POA: Diagnosis not present

## 2021-05-25 DIAGNOSIS — N3946 Mixed incontinence: Secondary | ICD-10-CM | POA: Diagnosis not present

## 2021-06-06 DIAGNOSIS — N3946 Mixed incontinence: Secondary | ICD-10-CM | POA: Diagnosis not present

## 2021-06-20 DIAGNOSIS — R350 Frequency of micturition: Secondary | ICD-10-CM | POA: Diagnosis not present

## 2021-07-13 DIAGNOSIS — E039 Hypothyroidism, unspecified: Secondary | ICD-10-CM | POA: Diagnosis not present

## 2021-07-13 DIAGNOSIS — R7303 Prediabetes: Secondary | ICD-10-CM | POA: Diagnosis not present

## 2021-07-13 DIAGNOSIS — E785 Hyperlipidemia, unspecified: Secondary | ICD-10-CM | POA: Diagnosis not present

## 2021-07-13 DIAGNOSIS — Z1231 Encounter for screening mammogram for malignant neoplasm of breast: Secondary | ICD-10-CM | POA: Diagnosis not present

## 2021-07-13 DIAGNOSIS — B001 Herpesviral vesicular dermatitis: Secondary | ICD-10-CM | POA: Diagnosis not present

## 2021-07-13 DIAGNOSIS — M8580 Other specified disorders of bone density and structure, unspecified site: Secondary | ICD-10-CM | POA: Diagnosis not present

## 2021-08-14 DIAGNOSIS — Z20822 Contact with and (suspected) exposure to covid-19: Secondary | ICD-10-CM | POA: Diagnosis not present

## 2021-08-14 DIAGNOSIS — Z1231 Encounter for screening mammogram for malignant neoplasm of breast: Secondary | ICD-10-CM | POA: Diagnosis not present

## 2021-08-23 DIAGNOSIS — M810 Age-related osteoporosis without current pathological fracture: Secondary | ICD-10-CM | POA: Diagnosis not present

## 2021-08-23 DIAGNOSIS — R5383 Other fatigue: Secondary | ICD-10-CM | POA: Diagnosis not present

## 2021-08-23 DIAGNOSIS — R109 Unspecified abdominal pain: Secondary | ICD-10-CM | POA: Diagnosis not present

## 2021-09-05 DIAGNOSIS — Z20822 Contact with and (suspected) exposure to covid-19: Secondary | ICD-10-CM | POA: Diagnosis not present

## 2021-09-06 DIAGNOSIS — Z1331 Encounter for screening for depression: Secondary | ICD-10-CM | POA: Diagnosis not present

## 2021-09-06 DIAGNOSIS — Z9181 History of falling: Secondary | ICD-10-CM | POA: Diagnosis not present

## 2021-09-06 DIAGNOSIS — E039 Hypothyroidism, unspecified: Secondary | ICD-10-CM | POA: Diagnosis not present

## 2021-09-06 DIAGNOSIS — D649 Anemia, unspecified: Secondary | ICD-10-CM | POA: Diagnosis not present

## 2021-09-06 DIAGNOSIS — Z6830 Body mass index (BMI) 30.0-30.9, adult: Secondary | ICD-10-CM | POA: Diagnosis not present

## 2021-09-06 DIAGNOSIS — K7460 Unspecified cirrhosis of liver: Secondary | ICD-10-CM | POA: Diagnosis not present

## 2021-09-06 DIAGNOSIS — R197 Diarrhea, unspecified: Secondary | ICD-10-CM | POA: Diagnosis not present

## 2021-09-11 DIAGNOSIS — R197 Diarrhea, unspecified: Secondary | ICD-10-CM | POA: Diagnosis not present

## 2021-09-11 DIAGNOSIS — R7989 Other specified abnormal findings of blood chemistry: Secondary | ICD-10-CM | POA: Diagnosis not present

## 2021-09-11 DIAGNOSIS — K7460 Unspecified cirrhosis of liver: Secondary | ICD-10-CM | POA: Diagnosis not present

## 2021-09-11 DIAGNOSIS — A09 Infectious gastroenteritis and colitis, unspecified: Secondary | ICD-10-CM | POA: Diagnosis not present

## 2021-09-16 DIAGNOSIS — Z20822 Contact with and (suspected) exposure to covid-19: Secondary | ICD-10-CM | POA: Diagnosis not present

## 2021-09-20 ENCOUNTER — Telehealth: Payer: Self-pay

## 2021-09-20 NOTE — Telephone Encounter (Signed)
Scheduled for 12/10/2021 °

## 2021-10-15 DIAGNOSIS — Z20822 Contact with and (suspected) exposure to covid-19: Secondary | ICD-10-CM | POA: Diagnosis not present

## 2021-10-19 DIAGNOSIS — Z20822 Contact with and (suspected) exposure to covid-19: Secondary | ICD-10-CM | POA: Diagnosis not present

## 2021-10-26 ENCOUNTER — Encounter: Payer: Self-pay | Admitting: Physician Assistant

## 2021-10-26 DIAGNOSIS — N201 Calculus of ureter: Secondary | ICD-10-CM | POA: Diagnosis not present

## 2021-10-26 DIAGNOSIS — Z20828 Contact with and (suspected) exposure to other viral communicable diseases: Secondary | ICD-10-CM | POA: Diagnosis not present

## 2021-10-26 DIAGNOSIS — N3946 Mixed incontinence: Secondary | ICD-10-CM | POA: Diagnosis not present

## 2021-10-29 DIAGNOSIS — A09 Infectious gastroenteritis and colitis, unspecified: Secondary | ICD-10-CM | POA: Diagnosis not present

## 2021-10-30 DIAGNOSIS — Z20822 Contact with and (suspected) exposure to covid-19: Secondary | ICD-10-CM | POA: Diagnosis not present

## 2021-11-13 NOTE — Progress Notes (Signed)
? ? ? ?11/14/2021 ?Emma Lewis ?[id number] ?1951/01/19 ? ?Referring provider: Philmore Pali, NP ?Primary GI doctor: Dr. Rush Landmark ? ?ASSESSMENT AND PLAN:  ? ?Diarrhea, unspecified type ?Chronic diarrhea since January after potential illness with husband as well. ?Hospital postinfectious IBS will give information. ?Do not have full report from PCP at this time, patient states possibly treated for stool infection which did improve after 10 days of antibiotics 4 times a day ?Patient also had hyperthyroidism 08/2021 visit states has not changed dose yet will recheck. ?We will go ahead and just repeat labs fluting stool studies ?If everything is negative may consider repeat colonoscopy rule out microscopic colitis ?11/11/2013 colonoscopy with Onalee Hua, MD for rectal bleeding showed second-degree hemorrhoids, left-sided diverticulosis recall 10 years. Due 2025. ?-     CBC with Differential/Platelet; Future ?-     Comprehensive metabolic panel ?-     TSH; Future ?-     C-reactive protein; Future ?-     Clostridium difficile Toxin B, Qualitative, Real-Time PCR; Future ?-     GI Profile, Stool, PCR; Future ? ?Cirrhosis of liver without ascites, unspecified hepatic cirrhosis type ([place]) ?-     Protime-INR; Future ?-     AFP tumor marker; Future ?-     US ABDOMEN LIMITED RUQ (LIVER/GB); Future ?Patient 1 time had [facility] for chronically elevated alkaline phosphatase, had more fatty liver at that time, most recent CT per PCP 11/2020 showed possible cirrhosis. ?We will go ahead and repeat right upper quadrant ultrasound, get INR and AFP to evaluate further. ? ?Anemia, unspecified type ?-     CBC with Differential/Platelet; Future ?Dr. Layne Benton checked hemoglobin which was 10.0 on 08/23/2021 was 12.9 at previous family practice visit 12/2020.  Normocytic at 4.  Amylase lipase normal.  Thyroid mildly hyperthyroid 0.33 ?Repeat hemoglobin on 09/08/2021 showed hemoglobin 9.8, platelets 211.   Ferritin normal at 34 iron 33 ?Recheck today. ? ? ?Patient Care Team: ?Philmore Pali, NP as PCP - General (Nurse Practitioner) ? ?HISTORY OF PRESENT ILLNESS: ?71 y.o. female with a past medical history of fatty liver, HLD, hypothyroidism, OA, DDD, and urinary incontinence and others listed below presents for evaluation of diarrhea, anemia, and cirrhosis.  ? ?11/11/2013 colonoscopy with Onalee Hua, MD for rectal bleeding showed second-degree hemorrhoids, left-sided diverticulosis recall 10 years. Due 2025. ? ?Labs reviewed from [facility] from 09/06/2021.  Patient that time was complaining of 3 weeks of diarrhea starting in January.  Husband did have some diarrhea previous week.  Dr. Layne Benton checked hemoglobin which was 10.0 on 08/23/2021 was 12.9 at previous family practice visit 12/2020.  Normocytic at 54.  Amylase lipase normal.  Thyroid mildly hyperthyroid 0.33 ?Repeat hemoglobin on 09/08/2021 showed hemoglobin 9.8, platelets 211.  Ferritin normal at 34 iron 33 ? ?Chronic elevated alk phos normal bone specific alkaline phosphatase with Dr. Layne Benton 08/23/2021.   ?Negative SPEP ?Cirrhosis noted 01/21 and 5/22 on CT scans previously saw Dawn drastic NP [facility] 2018 elastography at that time was high risk range. ? ?Has had diarrhea since Jan, husband also had diarrhea, thought was a bug. She was having urgency, some fecal incontinence. No nocturnal awakenings but if she had to urinate in the middle of the night would also have loose stools.  ?BM 3-7 times a day with increase gas, loose stools.  ?She has felt weak.  ?Patient denies blood in stool, fever, chills, unintentional weight loss. ?Denies  close contacts ill, recent camping, recent travel. ?Patient denies recent antibiotic use. ?She went from 88 mcg thyroid to 100 mcg 3 months ago, still on that dose.  ?Per patient she had positive Cdiff and a bacteria? ?STates had 10 day of ABX, did better while on it but the once  off of it had another 5 but still having issues.  ? ?Current Medications:  ? ?Current Outpatient Medications (Endocrine & Metabolic):  ?  levothyroxine (SYNTHROID, LEVOTHROID) 100 MCG tablet, Take 1 tablet (100 mcg total) by mouth daily before breakfast. ?  TYMLOS 3120 MCG/1.56ML SOPN, Inject into the skin. ? ?Current Outpatient Medications (Cardiovascular):  ?  atorvastatin (LIPITOR) 40 MG tablet, Take 40 mg by mouth daily. ? ? ? ? ?Current Outpatient Medications (Other):  ?  acyclovir (ZOVIRAX) 400 MG tablet, Take 1 tablet (400 mg total) by mouth 2 (two) times daily. (Patient taking differently: Take 400 mg by mouth See admin instructions. Take one tablet (400 mg) by mouth daily, may increase to one tablet (400 mg) twice daily as needed for fever blisters.) ?  Calcium Carb-Cholecalciferol (CALCIUM 600 + D PO), Take 1 tablet by mouth 3 (three) times daily.  ?  Cholecalciferol (VITAMIN D3) 2000 units TABS, Take 2,000 Units by mouth 2 (two) times daily.  ?  omeprazole (PRILOSEC OTC) 20 MG tablet, Take 10 mg by mouth daily. ? ?Medical History:  ?Past Medical History:  ?Diagnosis Date  ? Allergies   ? Arthritis   ? "all over my body" (04/29/2018)  ? Chronic lower back pain   ? Fatty liver   ? Fever blister   ? Takes Acyclovir  ? GERD (gastroesophageal reflux disease)   ? Hyperlipemia   ? Hypothyroidism   ? Kidney stones   ? Migraines   ? Osteopenia   ? Pneumonia   ? "once; years ago" (04/29/2018)  ? PONV (postoperative nausea and vomiting)   ? "dry heaves"  ? Pre-diabetes   ? Seasonal allergies   ? Vertigo   ? ?Allergies:  ?Allergies  ?Allergen Reactions  ? Sulfa Antibiotics Other (See Comments)  ?   Childhood allergy ?Mother said "I liked to have died" ?UNSPECIFIED "SEVERE CHILDHOOD REACTION"  ?  ? ?Surgical History:  ?She  has a past surgical history that includes Shoulder open rotator cuff repair (Right); Shoulder surgery (Right); Joint replacement; Hip Arthroplasty (Left, 2011); Colonoscopy; Anterior cervical  decomp/discectomy fusion (N/A, 02/09/2014); Cataract extraction w/ intraocular lens  implant, bilateral; Total hip revision (Left, 06/10/2016); Robotic assisted bilateral salpingo oophorectomy (Bilateral, 08/01/2015); Total hip arthroplasty (Right, 01/21/2018); Revision total hip arthroplasty (Left, 04/29/2018); Carpal tunnel release (Right, 2018); Patella fracture surgery (Right, ~ 1990); Patella hardware removal (Right); Knee arthroscopy (Right); Fixation kyphoplasty lumbar spine (X 2); Back surgery; Wrist ganglion excision (Left, 1970s); Total hip revision (Left, 04/29/2018); ORIF periprosthetic fracture (Left, 06/01/2018); Incision and drainage hip (Left, 06/19/2018); and Total hip revision (Left, 09/21/2018). ?Family History:  ?Her family history includes Arthritis in her mother; Cirrhosis in her father; Colon polyps in her father; Diabetes in her father; Hyperlipidemia in her father and mother; Lymphoma in her mother. ?Social History:  ? reports that she has never smoked. She has never used smokeless tobacco. She reports that she does not currently use alcohol. She reports that she does not use drugs. ? ?REVIEW OF SYSTEMS  : All other systems reviewed and negative except where noted in the History of Present Illness. ? ? ?PHYSICAL EXAM: ?BP 124/80   Pulse 75  Ht 5' 2"  (1.575 m)   Wt 174 lb 6.4 oz (79.1 kg)   SpO2 95%   BMI 31.90 kg/m?  ?General:   Pleasant, well developed female in no acute distress ?Head:  Normocephalic and atraumatic. ?Eyes: sclerae anicteric,conjunctive pink  ?Heart:  regular rate and rhythm ?Pulm: Clear anteriorly; no wheezing ?Abdomen:   Soft, Obese AB, Active bowel sounds. No tenderness . Without guarding and Without rebound, No organomegaly appreciated. ?Extremities:  Without edema. ?Msk:  Symmetrical without gross deformities. Peripheral pulses intact.  ?Neurologic:  Alert and  oriented x4;  No focal deficits.  ?Skin:   Dry and intact without significant lesions or rashes. ?Psychiatric:  Cooperative. Normal mood and affect. ? ? ? ?Vladimir Crofts, PA-C ?2:12 PM ? ? ?

## 2021-11-14 ENCOUNTER — Ambulatory Visit (INDEPENDENT_AMBULATORY_CARE_PROVIDER_SITE_OTHER): Payer: Medicare Other | Admitting: Physician Assistant

## 2021-11-14 ENCOUNTER — Encounter: Payer: Self-pay | Admitting: Physician Assistant

## 2021-11-14 ENCOUNTER — Other Ambulatory Visit (INDEPENDENT_AMBULATORY_CARE_PROVIDER_SITE_OTHER): Payer: Medicare Other

## 2021-11-14 VITALS — BP 124/80 | HR 75 | Ht 62.0 in | Wt 174.4 lb

## 2021-11-14 DIAGNOSIS — K7460 Unspecified cirrhosis of liver: Secondary | ICD-10-CM | POA: Diagnosis not present

## 2021-11-14 DIAGNOSIS — R197 Diarrhea, unspecified: Secondary | ICD-10-CM

## 2021-11-14 DIAGNOSIS — D649 Anemia, unspecified: Secondary | ICD-10-CM | POA: Diagnosis not present

## 2021-11-14 LAB — CBC WITH DIFFERENTIAL/PLATELET
Basophils Absolute: 0 10*3/uL (ref 0.0–0.1)
Basophils Relative: 0.5 % (ref 0.0–3.0)
Eosinophils Absolute: 0.2 10*3/uL (ref 0.0–0.7)
Eosinophils Relative: 4.5 % (ref 0.0–5.0)
HCT: 29.4 % — ABNORMAL LOW (ref 36.0–46.0)
Hemoglobin: 9.7 g/dL — ABNORMAL LOW (ref 12.0–15.0)
Lymphocytes Relative: 32.1 % (ref 12.0–46.0)
Lymphs Abs: 1.6 10*3/uL (ref 0.7–4.0)
MCHC: 32.9 g/dL (ref 30.0–36.0)
MCV: 81.1 fl (ref 78.0–100.0)
Monocytes Absolute: 0.4 10*3/uL (ref 0.1–1.0)
Monocytes Relative: 8.2 % (ref 3.0–12.0)
Neutro Abs: 2.7 10*3/uL (ref 1.4–7.7)
Neutrophils Relative %: 54.7 % (ref 43.0–77.0)
Platelets: 214 10*3/uL (ref 150.0–400.0)
RBC: 3.62 Mil/uL — ABNORMAL LOW (ref 3.87–5.11)
RDW: 17.2 % — ABNORMAL HIGH (ref 11.5–15.5)
WBC: 5 10*3/uL (ref 4.0–10.5)

## 2021-11-14 LAB — TSH: TSH: 0.72 u[IU]/mL (ref 0.35–5.50)

## 2021-11-14 LAB — PROTIME-INR
INR: 1.2 ratio — ABNORMAL HIGH (ref 0.8–1.0)
Prothrombin Time: 13.4 s — ABNORMAL HIGH (ref 9.6–13.1)

## 2021-11-14 LAB — C-REACTIVE PROTEIN: CRP: 1.5 mg/dL (ref 0.5–20.0)

## 2021-11-14 MED ORDER — COLESTIPOL HCL 1 G PO TABS
1.0000 g | ORAL_TABLET | Freq: Two times a day (BID) | ORAL | 0 refills | Status: DC | PRN
Start: 2021-11-14 — End: 2021-11-15

## 2021-11-14 NOTE — Patient Instructions (Signed)
If you are age 73 or older, your body mass index should be between 23-30. Your Body mass index is 31.9 kg/m?Marland Kitchen If this is out of the aforementioned range listed, please consider follow up with your Primary Care Provider. ?________________________________________________________ ? ?The [facility] providers would like to encourage you to use [facility] to communicate with providers for non-urgent requests or questions.  Due to long hold times on the telephone, sending your provider a message by [facility] may be a faster and more efficient way to get a response.  Please allow 48 business hours for a response.  Please remember that this is for non-urgent requests.  ?_______________________________________________________ ? ?Your provider has requested that you go to the basement level for lab work before leaving today. Press "B" on the elevator. The lab is located at the first door on the left as you exit the elevator. ? ?You have been scheduled for an abdominal ultrasound at [facility]  Radiology (1st floor of hospital) on 11/19/2021 at 9:30 am. Please arrive 15 minutes prior to your appointment for registration. Make certain not to have anything to eat or drink 6 hours prior to your appointment. Should you need to reschedule your appointment, please contact radiology at [phone]. This test typically takes about 30 minutes to perform. ? ?Add on over the counter florastor '250mg'$  BID for 1 month.  ?Please remind patient of hand washing with soap, wipe down surfaces to avoid spread.  ? ?START Colestid 1 gm twice daily AS NEEDED for diarrhea #60 ? ? ?Follow up 4 weeks in the office for evaluation, call sooner if any issues. ? ? ?Please try low FODMAP diet ?Try trial off milk/lactose products.  ?Add fiber like benefiber or citracel once a day ?Increase activity ?Can do trial of IBGard for AB pain- Take 1-2 capsules once a day for maintence or twice a day during a flare ?Please try to decrease stress. ?if any worsening  symptoms like blood in stool, weight loss, please call the office or go to the ER.  ? ?You should have an imaging study every 6 months to monitor for the development of hepatocellular carcinoma (liver cancer). The risk is low, but, if liver cancer is diagnosed early, there are better treatment options. ?Will get AFP, INR, CBC, and CMET.  ?Will follow up in 6 months to check labs and evaluate.  ? ?I recommend a high-protein, primarily plant-based diet. Avoid red meat. Work to maintain a health weight. ?Weigh yourself daily- call if you have weight gain of greater than 5 lbs in a 1-2 days, leg swelling, or new swelling in your abdomen. ?Minimize salt intake- VERY important. Please do not consume more than 2000 mg of sodium every day. ?Monitor your blood pressure at home. ? ?Stay active. Weight-based exercise for 30 minutes at least 3 days a week is recommended. ?I recommend that you not drink any alcohol including beer, wine, liquor, and non-alcoholic beer. ?  ?You are at increased risk of osteopenia and osteoporosis. You should be screened for these metabolic bone diseases if you have not already had the testing performed. ? ?Cirrhosis ?Cirrhosis is long-term (chronic) liver injury. The liver is the body's largest internal organ, and it performs many functions. It converts food into energy, removes toxic material from the blood, makes important proteins, and absorbs necessary vitamins from food. ?In cirrhosis, healthy liver cells are replaced by scar tissue. This prevents blood from flowing through the liver and makes it difficult for the liver to complete its  functions. ?What are the causes? ?Common causes of this condition are hepatitis C and long-term alcohol abuse. Other causes include: ?Nonalcoholic fatty liver disease (NAFLD). This happens when fat is deposited in the liver by causes other than alcohol. ?Hepatitis B infection. ?Autoimmune hepatitis. In this condition, the body's defense system (immune system)  mistakenly attacks the liver cells, causing inflammation. ?Diseases that cause blockage of ducts inside the liver. ?Inherited liver diseases, such as hemochromatosis. This is one of the most common inherited liver diseases. In this disease, deposits of iron collect in the liver and other organs. ?Reactions to certain long-term medicines, such as amiodarone, a heart medicine. ?Parasitic infections. These include schistosomiasis, which is caused by a flatworm. ?Long-term contact to certain toxins. These toxins include certain organic solvents, such as toluene and chloroform. ?What increases the risk? ?You are more likely to develop this condition if: ?You have certain types of viral hepatitis. ?You abuse alcohol, especially if you are female. ?You are overweight. ?You use IV drugs and share needles. ?You have unprotected sex with someone who has viral hepatitis. ?What are the signs or symptoms? ?You may not have any signs and symptoms at first. Symptoms may not develop until the damage to your liver starts to get worse. ?Early symptoms may include: ?Weakness and tiredness (fatigue). ?Changes in sleep patterns or having trouble sleeping. ?Itchiness. ?Tenderness in the right-upper part of your abdomen. ?Weight loss and muscle loss. ?Nausea. ?Loss of appetite. ?Later symptoms may include: ?Fatigue or weakness that is getting worse. ?Yellow skin and eyes (jaundice). ?Buildup of fluid in the abdomen (ascites). You may notice that your clothes are tight around your waist. ?Weight gain and swelling of the feet and ankles (edema). ?Trouble breathing. ?Easy bruising and bleeding. ?Vomiting blood, or black or bloody stool. ?Mental confusion. ?How is this diagnosed? ?Your health care provider may suspect cirrhosis based on your symptoms and medical history, especially if you have other medical conditions or a history of alcohol abuse. Your health care provider will do a physical exam to feel your liver and to check for signs of  cirrhosis. Tests may include: ?Blood tests to check: ?For hepatitis B or C. ?Kidney function. ?Liver function. ?Imaging tests such as: ?MRI or CT scan to look for changes seen in advanced cirrhosis. ?Ultrasound to see if normal liver tissue is being replaced by scar tissue. ?A procedure in which a long needle is used to take a sample of liver tissue to be checked in a lab (biopsy). Liver biopsy can confirm the diagnosis of cirrhosis. ?How is this treated? ?Treatment for this condition depends on how damaged your liver is and what caused the damage. It may include treating the symptoms of cirrhosis, or treating the underlying causes to slow the damage. Treatment may include: ?Making lifestyle changes, such as: ?Eating a healthy diet. You may need to work with your health care provider or a dietitian to develop an eating plan. ?Restricting salt intake. ?Maintaining a healthy weight. ?Not abusing drugs or alcohol. ?Taking medicines to: ?Treat liver infections or other infections. ?Control itching. ?Reduce fluid buildup. ?Reduce certain blood toxins. ?Reduce risk of bleeding from enlarged blood vessels in the stomach or esophagus (varices). ?Liver transplant. In this procedure, a liver from a donor is used to replace your diseased liver. This is done if cirrhosis has caused liver failure. ?Other treatments and procedures may be done depending on the problems that you get from cirrhosis. Common problems include liver-related kidney failure (hepatorenal syndrome). ?Follow these instructions  at home: ? ?Take medicines only as told by your health care provider. Do not use medicines that are toxic to your liver. Ask your health care provider before taking any new medicines, including over-the-counter medicines such as NSAIDs. ?Rest as needed. ?Eat a well-balanced diet. ?Limit your salt or water intake, if your health care provider asks you to do this. ?Do not drink alcohol. This is especially important if you routinely take  acetaminophen. ?Keep all follow-up visits. This is important. ?Contact a health care provider if you: ?Have fatigue or weakness that is getting worse. ?Develop swelling of the hands, feet, or legs, or a

## 2021-11-14 NOTE — Progress Notes (Signed)
Attending Physician's Attestation  ? ?I have reviewed the chart.  ? ?I agree with the Advanced Practitioner's note, impression, and recommendations with any updates as below. ?Based on patient's clinical history and imaging, and prior history of abnormality on elastography, underlying cirrhosis may be occurring.  Agree with additional work-up.  May be reasonable to consider upper endoscopy for evaluation of portal hypertension.  Also in the setting of her GI symptoms the potential need for colonoscopy after stool studies have returned.  Consider both of these procedures at the same time in the near future.  Patient at risk for SIBO and EPI as well and may need to consider that work-up in the future to. ? ? ?Justice Britain, MD ?[facility] ?Advanced Endoscopy ?Office # [phone] ? ?

## 2021-11-15 ENCOUNTER — Telehealth: Payer: Self-pay | Admitting: Physician Assistant

## 2021-11-15 LAB — AFP TUMOR MARKER: AFP-Tumor Marker: 4 ng/mL

## 2021-11-15 MED ORDER — COLESTIPOL HCL 1 G PO TABS: 1.0000 g | ORAL_TABLET | Freq: Two times a day (BID) | ORAL | 0 refills | Status: DC | PRN

## 2021-11-15 NOTE — Telephone Encounter (Signed)
Called and spoke with patient. See 11/14/21 lab result note for details. ?

## 2021-11-15 NOTE — Telephone Encounter (Signed)
Patient called requesting for [place] to resend script for colestid. Per patient, the pharmacy never received it. Patient would also like to discuss lab results from yesterday. Please advise.  ?

## 2021-11-15 NOTE — Telephone Encounter (Signed)
Sent labs to my nurse and via my chart.  ?Will resend colestid, only sent 30 to see if patient responds well to it or not. Resent to [facility].  ?Pending stool samples may schedule for colonoscopy and can consider EGD ?

## 2021-11-16 ENCOUNTER — Other Ambulatory Visit: Payer: Medicare Other

## 2021-11-16 DIAGNOSIS — R197 Diarrhea, unspecified: Secondary | ICD-10-CM

## 2021-11-19 ENCOUNTER — Telehealth: Payer: Self-pay | Admitting: Physician Assistant

## 2021-11-19 ENCOUNTER — Ambulatory Visit
Admission: RE | Admit: 2021-11-19 | Discharge: 2021-11-19 | Disposition: A | Discharge status: Home / Self Care | Payer: Medicare Other | Source: Ambulatory Visit | Attending: Physician Assistant | Admitting: Physician Assistant

## 2021-11-19 DIAGNOSIS — R945 Abnormal results of liver function studies: Secondary | ICD-10-CM | POA: Diagnosis not present

## 2021-11-19 DIAGNOSIS — K7460 Unspecified cirrhosis of liver: Secondary | ICD-10-CM | POA: Diagnosis not present

## 2021-11-19 LAB — GI PROFILE, STOOL, PCR

## 2021-11-19 LAB — CLOSTRIDIUM DIFFICILE TOXIN B, QUALITATIVE, REAL-TIME PCR: Toxigenic C. Difficile by PCR: NOT DETECTED

## 2021-11-19 MED ORDER — VANCOMYCIN HCL 125 MG PO CAPS: ORAL_CAPSULE | ORAL | 0 refills | Status: DC

## 2021-11-19 NOTE — Telephone Encounter (Signed)
Patient called stating there were issues with the medications that were called in for her--something about insurance declining them.  She was confusing in relaying the information.  Please call and advise.  Thank you. ?

## 2021-11-19 NOTE — Progress Notes (Signed)
Sent in long vancomycin taper for reoccurrence, if his does not help will send in dificin.  ?Get on florastor 2150 mg twice daily, hand washing and wiping down surfaces around bathroom used.  ?Any weakness, confusion, worsening symptoms please call our office or go to ER.  ?

## 2021-11-20 ENCOUNTER — Telehealth: Payer: Self-pay

## 2021-11-20 NOTE — Telephone Encounter (Signed)
Received call from [facility] that patient's [place] results were available, and to make MD aware. Will route results to Dr. Bryan Lemma (doc of the day) since ordering provider is off today.  ?

## 2021-11-20 NOTE — Telephone Encounter (Signed)
I reviewed the ultrasound.  No evidence of any liver cancers.  There is mild bile duct dilation but no overt stones within the gallbladder.  In the setting of her still having her gallbladder I would recommend a nonurgent MRI/MRCP.  This will help rule out choledocholithiasis or any other abnormality within the bile duct.  She may need an endoscopic ultrasound at some point in time but for now she is dealing with her C. difficile infection and her MRI/MRCP can be done at her convenience in the next few weeks. ?Thanks. ?GM ?

## 2021-11-20 NOTE — Telephone Encounter (Signed)
Called and spoke with patient and advised that [facility] could not fill her Colestid due to it already being filled at [facility]. She has advised me that she has picked it up. She has also been made aware that her Vancomycin has been filled and is waiting. ?

## 2021-11-21 ENCOUNTER — Other Ambulatory Visit: Payer: Self-pay

## 2021-11-21 DIAGNOSIS — Z20822 Contact with and (suspected) exposure to covid-19: Secondary | ICD-10-CM | POA: Diagnosis not present

## 2021-11-21 DIAGNOSIS — R7989 Other specified abnormal findings of blood chemistry: Secondary | ICD-10-CM

## 2021-11-21 DIAGNOSIS — D649 Anemia, unspecified: Secondary | ICD-10-CM

## 2021-11-21 DIAGNOSIS — K7460 Unspecified cirrhosis of liver: Secondary | ICD-10-CM

## 2021-11-21 DIAGNOSIS — R197 Diarrhea, unspecified: Secondary | ICD-10-CM

## 2021-11-21 NOTE — Telephone Encounter (Addendum)
Florastor '250mg'$  twice a day ?Set up for MRCP/MRI in next few weeks has OV 05/17 with me for follow up.  ?

## 2021-11-27 DIAGNOSIS — Z20822 Contact with and (suspected) exposure to covid-19: Secondary | ICD-10-CM | POA: Diagnosis not present

## 2021-11-29 DIAGNOSIS — Z20822 Contact with and (suspected) exposure to covid-19: Secondary | ICD-10-CM | POA: Diagnosis not present

## 2021-11-30 ENCOUNTER — Ambulatory Visit
Admission: RE | Admit: 2021-11-30 | Discharge: 2021-11-30 | Disposition: A | Discharge status: Home / Self Care | Payer: Medicare Other | Source: Ambulatory Visit | Attending: Physician Assistant | Admitting: Physician Assistant

## 2021-11-30 ENCOUNTER — Other Ambulatory Visit: Payer: Self-pay | Admitting: Physician Assistant

## 2021-11-30 DIAGNOSIS — R197 Diarrhea, unspecified: Secondary | ICD-10-CM | POA: Diagnosis not present

## 2021-11-30 DIAGNOSIS — K7460 Unspecified cirrhosis of liver: Secondary | ICD-10-CM

## 2021-11-30 DIAGNOSIS — R7989 Other specified abnormal findings of blood chemistry: Secondary | ICD-10-CM | POA: Insufficient documentation

## 2021-11-30 MED ORDER — GADOBUTROL 1 MMOL/ML IV SOLN
7.5000 mL | Freq: Once | INTRAVENOUS | Status: AC | PRN
  Administered 2021-11-30: 7.5 mL via INTRAVENOUS

## 2021-12-02 DIAGNOSIS — Z20822 Contact with and (suspected) exposure to covid-19: Secondary | ICD-10-CM | POA: Diagnosis not present

## 2021-12-10 ENCOUNTER — Ambulatory Visit: Payer: Medicare Other | Admitting: Gastroenterology

## 2021-12-11 NOTE — Progress Notes (Signed)
? ? ? ?12/12/2021 ?CARON ODE ?[id number] ?Mar 08, 1951 ? ?Referring provider: Philmore Pali, NP ?Primary GI doctor: Dr. Rush Landmark ? ?ASSESSMENT AND PLAN:  ? ?Diarrhea, unspecified type ?Improved but still 1-3 a day.  ?Better with colestid, possible bile acid reflux ?Will schedule for colonsocopy and check for EPI since still not at goal and patient is interested in endoscopic evaluation at this time.  ?If negative consider trial of Xifaxan 550 mg TID x 14 days or SIBO testing.  ?11/11/2013 colonoscopy with Onalee Hua, MD for rectal bleeding showed second-degree hemorrhoids, left-sided diverticulosis recall 10 years. Due 2025. ?We have discussed the risks of bleeding, infection, perforation, medication reactions, and remote risk of death associated with colonoscopy. All questions were answered and the patient acknowledges these risk and wishes to proceed. ? ?Cirrhosis of liver without ascites, unspecified hepatic cirrhosis type ([place]) ?HGB 9.7 Platelets 214.0 ?INR 11/14/2021 1.2  ?AB [place] in 6 months, follow up OV ?Will schedule for EGD to evaluate for varices with evidence of portal HTN on MRCP ?-Hepatic encephalopathy- there is not evidence of hepatic encephalopathy ?-Nutrition and low sodium diet discussed with patient and information given ?-Continue daily multivitamin ?-Recommended 30 minutes of aerobic and resistance exercise 3 days/week ? ?Anemia, unspecified type ?CBC on 11/14/2021   ?WBC 5.0 HGB 9.7 MCV 81.1 Platelets 214.0 ? ? ?Patient Care Team: ?Philmore Pali, NP as PCP - General (Nurse Practitioner) ? ?HISTORY OF PRESENT ILLNESS: ?71 y.o. female with a past medical history of fatty liver, HLD, hypothyroidism, OA, DDD, and urinary incontinence and others listed below presents for evaluation of diarrhea, anemia, and cirrhosis.  ? ?11/11/2013 colonoscopy with Onalee Hua, MD for rectal bleeding showed second-degree hemorrhoids, left-sided diverticulosis recall 10 years. Due  2025. ?Chronically elevated alk phos, cirrhosis noted on CT 07/2019 and 12/15/2020 ?11/14/2021 patient seen in the office for diarrhea and cirrhosis. ?White blood cell count 5.0, hematocrit 9.7, MCV 81, platelets 214 ?Thyroid 0.72 ?CRP 1.5 ?Treated with longer taper of vancomycin for recent C. difficile infection, GI stool showed toxin AMB positive PCR negative otherwise negative.   ?INR 1.2 AFP 4.0. 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 ?11/30/2021 MRCP showed hepatic cirrhosis no suspicious masses, NO biliary ductal dilatation or choledocholithiasis, evidence of portal hypertension with splenomegaly and varices complex cystic mass left adrenal gland stable since 2021 consider follow-up MRI 12 months ? ?Her Bm's have improved, she has gone from 5-10 a day to 1-3 a day for last 5 days, has improved but states still not best.  ? Have gone from watery stools to some formed/more pieces in stool.  ?She is still on the colestid twice a day s/p cholyestectomy.  ?Patient denies blood in stool, fever, chills, unintentional weight loss. ?No nocturnal diarrhea, no fecal incontinence. She does have urinary ?She is on ibuprofen 4 in the AM and 4 at night.  ? ? ?Current Medications:  ? ?Current Outpatient Medications (Endocrine & Metabolic):  ?  levothyroxine (SYNTHROID, LEVOTHROID) 100 MCG tablet, Take 1 tablet (100 mcg total) by mouth daily before breakfast. ?  TYMLOS 3120 MCG/1.56ML SOPN, Inject into the skin. ? ?Current Outpatient Medications (Cardiovascular):  ?  atorvastatin (LIPITOR) 40 MG tablet, Take 40 mg by mouth daily. ?  colestipol (COLESTID) 1 g tablet, Take 1 tablet (1 g total) by mouth 2 (two) times daily as needed (diarrhea- stop if causes constipation). ? ? ? ? ?Current Outpatient  Medications (Other):  ?  acyclovir (ZOVIRAX) 400 MG tablet, Take 1 tablet (400 mg total) by mouth 2 (two) times daily.  (Patient taking differently: Take 400 mg by mouth See admin instructions. Take one tablet (400 mg) by mouth daily, may increase to one tablet (400 mg) twice daily as needed for fever blisters.) ?  Calcium Carb-Cholecalciferol (CALCIUM 600 + D PO), Take 1 tablet by mouth 3 (three) times daily.  ?  Cholecalciferol (VITAMIN D3) 2000 units TABS, Take 2,000 Units by mouth 2 (two) times daily.  ?  omeprazole (PRILOSEC OTC) 20 MG tablet, Take 10 mg by mouth daily. ?  vancomycin (VANCOCIN) 125 MG capsule, Take 1 capsule (125 mg total) by mouth 4 (four) times daily for 12 days, THEN 1 capsule (125 mg total) 2 (two) times daily for 7 days, THEN 1 capsule (125 mg total) daily for 7 days, THEN 1 capsule (125 mg total) every other day for 14 days. ? ?Medical History:  ?Past Medical History:  ?Diagnosis Date  ? Allergies   ? Arthritis   ? "all over my body" (04/29/2018)  ? Chronic lower back pain   ? Fatty liver   ? Fever blister   ? Takes Acyclovir  ? GERD (gastroesophageal reflux disease)   ? Hyperlipemia   ? Hypothyroidism   ? Kidney stones   ? Migraines   ? Osteopenia   ? Pneumonia   ? "once; years ago" (04/29/2018)  ? PONV (postoperative nausea and vomiting)   ? "dry heaves"  ? Pre-diabetes   ? Seasonal allergies   ? Vertigo   ? ?Allergies:  ?Allergies  ?Allergen Reactions  ? Sulfa Antibiotics Other (See Comments)  ?   Childhood allergy ?Mother said "I liked to have died" ?UNSPECIFIED "SEVERE CHILDHOOD REACTION"  ?  ? ?Surgical History:  ?She  has a past surgical history that includes Shoulder open rotator cuff repair (Right); Shoulder surgery (Right); Joint replacement; Hip Arthroplasty (Left, 2011); Colonoscopy; Anterior cervical decomp/discectomy fusion (N/A, 02/09/2014); Cataract extraction w/ intraocular lens  implant, bilateral; Total hip revision (Left, 06/10/2016); Robotic assisted bilateral salpingo oophorectomy (Bilateral, 08/01/2015); Total hip arthroplasty (Right, 01/21/2018); Revision total hip arthroplasty (Left,  04/29/2018); Carpal tunnel release (Right, 2018); Patella fracture surgery (Right, ~ 1990); Patella hardware removal (Right); Knee arthroscopy (Right); Fixation kyphoplasty lumbar spine (X 2); Back surgery; Wrist ganglion excision (Left, 1970s); Total hip revision (Left, 04/29/2018); ORIF periprosthetic fracture (Left, 06/01/2018); Incision and drainage hip (Left, 06/19/2018); and Total hip revision (Left, 09/21/2018). ?Family History:  ?Her family history includes Arthritis in her mother; Cirrhosis in her father; Colon polyps in her father; Diabetes in her father; Hyperlipidemia in her father and mother; Lymphoma in her mother. ?Social History:  ? reports that she has never smoked. She has never used smokeless tobacco. She reports that she does not currently use alcohol. She reports that she does not use drugs. ? ?REVIEW OF SYSTEMS  : All other systems reviewed and negative except where noted in the History of Present Illness. ? ? ?PHYSICAL EXAM: ?BP 124/72   Pulse 78   Ht 5' 3"  (1.6 m)   Wt 170 lb 6.4 oz (77.3 kg)   SpO2 97%   BMI 30.19 kg/m?  ?General:   Pleasant, well developed female in no acute distress ?Heart:  regular rate and rhythm ?Pulm: Clear anteriorly; no wheezing ?Abdomen:   Soft, Obese AB, Active bowel sounds. No tenderness . Without guarding and Without rebound, No organomegaly appreciated. ?Extremities:  Without edema. ?Msk:  Symmetrical without gross deformities. Peripheral pulses intact.  ?Neurologic:  Alert and  oriented x4;  No focal deficits.  ?Skin:   Dry and intact without significant lesions or rashes. ?Psychiatric: Cooperative. Normal mood and affect. ? ? ? ?Vladimir Crofts, PA-C ?2:27 PM ? ? ?

## 2021-12-12 ENCOUNTER — Encounter: Payer: Self-pay | Admitting: Physician Assistant

## 2021-12-12 ENCOUNTER — Ambulatory Visit (INDEPENDENT_AMBULATORY_CARE_PROVIDER_SITE_OTHER): Payer: Medicare Other | Admitting: Physician Assistant

## 2021-12-12 VITALS — BP 124/72 | HR 78 | Ht 63.0 in | Wt 170.4 lb

## 2021-12-12 DIAGNOSIS — K219 Gastro-esophageal reflux disease without esophagitis: Secondary | ICD-10-CM | POA: Diagnosis not present

## 2021-12-12 DIAGNOSIS — R197 Diarrhea, unspecified: Secondary | ICD-10-CM

## 2021-12-12 DIAGNOSIS — D649 Anemia, unspecified: Secondary | ICD-10-CM | POA: Diagnosis not present

## 2021-12-12 DIAGNOSIS — K7460 Unspecified cirrhosis of liver: Secondary | ICD-10-CM | POA: Diagnosis not present

## 2021-12-12 MED ORDER — PLENVU 140 G PO SOLR: 1.0000 | Freq: Once | ORAL | 0 refills | Status: AC

## 2021-12-12 NOTE — Patient Instructions (Addendum)
If you are age 73 or older, your body mass index should be between 23-30. Your Body mass index is 30.19 kg/m?Marland Kitchen If this is out of the aforementioned range listed, please consider follow up with your Primary Care Provider. ? ?If you are age 33 or younger, your body mass index should be between 19-25. Your Body mass index is 30.19 kg/m?Marland Kitchen If this is out of the aformentioned range listed, please consider follow up with your Primary Care Provider.  ? ?________________________________________________________ ? ?The [facility] providers would like to encourage you to use [facility] to communicate with providers for non-urgent requests or questions.  Due to long hold times on the telephone, sending your provider a message by [facility] may be a faster and more efficient way to get a response.  Please allow 48 business hours for a response.  Please remember that this is for non-urgent requests.  ?_______________________________________________________ ? ?You have been scheduled for an endoscopy and colonoscopy. Please follow the written instructions given to you at your visit today. ?Please pick up your prep supplies at the pharmacy within the next 1-3 days. ?If you use inhalers (even only as needed), please bring them with you on the day of your procedure. ? ?Please go to the lab in the basement of our building to have lab work done as you leave today. Hit "B" for basement when you get on the elevator.  When the doors open the lab is on your left.  We will call you with the results. Thank you. ? ? ?Please try low FODMAP diet ?Try trial off milk/lactose products.  ?continue fiber like benefiber or citracel once a day ?Can do trial of IBGard for AB pain- Take 1-2 capsules once a day for maintence or twice a day during a flare ?if any worsening symptoms like blood in stool, weight loss, please call the office or go to the ER.  ? ?Will follow up in 6 months to check labs and evaluate.  ?  ?Keep a food diary. This will help you  identify foods that cause symptoms. Write down: ?What you eat and when. ?What symptoms you have. ?When symptoms occur in relation to your meals. ?Avoid foods that cause symptoms. Talk with your dietitian about other ways to get the same nutrients that are in these foods. ?Eat your meals slowly, in a relaxed setting. ?Aim to eat 5-6 small meals per day. Do not skip meals. ?Drink enough fluids to keep your urine clear or pale yellow. ?If dairy products cause your symptoms to flare up, try eating less of them. You might be able to handle yogurt better than other dairy products because it contains bacteria that help with digestion. ? ? ? ? ?I recommend a high-protein, primarily plant-based diet. Avoid red meat. Work to maintain a health weight. ?Weigh yourself daily- call if you have weight gain of greater than 5 lbs in a 1-2 days, leg swelling, or new swelling in your abdomen. ?Minimize salt intake- VERY important. Please do not consume more than 2000 mg of sodium every day. ?Monitor your blood pressure at home. ? ?Stay active. Weight-based exercise for 30 minutes at least 3 days a week is recommended. ?I recommend that you not drink any alcohol including beer, wine, liquor, and non-alcoholic beer. ?  ?You are at increased risk of osteopenia and osteoporosis. You should be screened for these metabolic bone diseases if you have not already had the testing performed. ? ?Cirrhosis ?Cirrhosis is long-term (chronic) liver injury. The liver  is the body's largest internal organ, and it performs many functions. It converts food into energy, removes toxic material from the blood, makes important proteins, and absorbs necessary vitamins from food. ?In cirrhosis, healthy liver cells are replaced by scar tissue. This prevents blood from flowing through the liver and makes it difficult for the liver to complete its functions. ?What are the causes? ?Common causes of this condition are hepatitis C and long-term alcohol abuse. Other  causes include: ?Nonalcoholic fatty liver disease (NAFLD). This happens when fat is deposited in the liver by causes other than alcohol. ?Hepatitis B infection. ?Autoimmune hepatitis. In this condition, the body's defense system (immune system) mistakenly attacks the liver cells, causing inflammation. ?Diseases that cause blockage of ducts inside the liver. ?Inherited liver diseases, such as hemochromatosis. This is one of the most common inherited liver diseases. In this disease, deposits of iron collect in the liver and other organs. ?Reactions to certain long-term medicines, such as amiodarone, a heart medicine. ?Parasitic infections. These include schistosomiasis, which is caused by a flatworm. ?Long-term contact to certain toxins. These toxins include certain organic solvents, such as toluene and chloroform. ?What increases the risk? ?You are more likely to develop this condition if: ?You have certain types of viral hepatitis. ?You abuse alcohol, especially if you are female. ?You are overweight. ?You use IV drugs and share needles. ?You have unprotected sex with someone who has viral hepatitis. ?What are the signs or symptoms? ?You may not have any signs and symptoms at first. Symptoms may not develop until the damage to your liver starts to get worse. ?Early symptoms may include: ?Weakness and tiredness (fatigue). ?Changes in sleep patterns or having trouble sleeping. ?Itchiness. ?Tenderness in the right-upper part of your abdomen. ?Weight loss and muscle loss. ?Nausea. ?Loss of appetite. ?Later symptoms may include: ?Fatigue or weakness that is getting worse. ?Yellow skin and eyes (jaundice). ?Buildup of fluid in the abdomen (ascites). You may notice that your clothes are tight around your waist. ?Weight gain and swelling of the feet and ankles (edema). ?Trouble breathing. ?Easy bruising and bleeding. ?Vomiting blood, or black or bloody stool. ?Mental confusion. ?How is this diagnosed? ?Your health care  provider may suspect cirrhosis based on your symptoms and medical history, especially if you have other medical conditions or a history of alcohol abuse. Your health care provider will do a physical exam to feel your liver and to check for signs of cirrhosis. Tests may include: ?Blood tests to check: ?For hepatitis B or C. ?Kidney function. ?Liver function. ?Imaging tests such as: ?MRI or CT scan to look for changes seen in advanced cirrhosis. ?Ultrasound to see if normal liver tissue is being replaced by scar tissue. ?A procedure in which a long needle is used to take a sample of liver tissue to be checked in a lab (biopsy). Liver biopsy can confirm the diagnosis of cirrhosis. ?How is this treated? ?Treatment for this condition depends on how damaged your liver is and what caused the damage. It may include treating the symptoms of cirrhosis, or treating the underlying causes to slow the damage. Treatment may include: ?Making lifestyle changes, such as: ?Eating a healthy diet. You may need to work with your health care provider or a dietitian to develop an eating plan. ?Restricting salt intake. ?Maintaining a healthy weight. ?Not abusing drugs or alcohol. ?Taking medicines to: ?Treat liver infections or other infections. ?Control itching. ?Reduce fluid buildup. ?Reduce certain blood toxins. ?Reduce risk of bleeding from enlarged blood vessels  in the stomach or esophagus (varices). ?Liver transplant. In this procedure, a liver from a donor is used to replace your diseased liver. This is done if cirrhosis has caused liver failure. ?Other treatments and procedures may be done depending on the problems that you get from cirrhosis. Common problems include liver-related kidney failure (hepatorenal syndrome). ?Follow these instructions at home: ? ?Take medicines only as told by your health care provider. Do not use medicines that are toxic to your liver. Ask your health care provider before taking any new medicines,  including over-the-counter medicines such as NSAIDs. ?Rest as needed. ?Eat a well-balanced diet. ?Limit your salt or water intake, if your health care provider asks you to do this. ?Do not drink alcohol. This is especi

## 2021-12-17 NOTE — Progress Notes (Signed)
Attending Physician's Attestation   I have reviewed the chart.   I agree with the Advanced Practitioner's note, impression, and recommendations with any updates as below.    Deren Degrazia Mansouraty, MD [facility]oscopy Office # [phone]  

## 2021-12-26 ENCOUNTER — Encounter: Payer: Self-pay | Admitting: Gastroenterology

## 2021-12-26 ENCOUNTER — Ambulatory Visit (AMBULATORY_SURGERY_CENTER): Payer: Medicare Other | Admitting: Gastroenterology

## 2021-12-26 VITALS — BP 109/46 | HR 75 | Temp 97.1°F | Resp 13 | Ht 63.0 in | Wt 170.0 lb

## 2021-12-26 DIAGNOSIS — K319 Disease of stomach and duodenum, unspecified: Secondary | ICD-10-CM | POA: Diagnosis not present

## 2021-12-26 DIAGNOSIS — K219 Gastro-esophageal reflux disease without esophagitis: Secondary | ICD-10-CM | POA: Diagnosis not present

## 2021-12-26 DIAGNOSIS — K641 Second degree hemorrhoids: Secondary | ICD-10-CM | POA: Diagnosis not present

## 2021-12-26 DIAGNOSIS — R12 Heartburn: Secondary | ICD-10-CM | POA: Diagnosis not present

## 2021-12-26 DIAGNOSIS — D649 Anemia, unspecified: Secondary | ICD-10-CM | POA: Diagnosis not present

## 2021-12-26 DIAGNOSIS — K52831 Collagenous colitis: Secondary | ICD-10-CM

## 2021-12-26 DIAGNOSIS — K7460 Unspecified cirrhosis of liver: Secondary | ICD-10-CM | POA: Diagnosis not present

## 2021-12-26 DIAGNOSIS — K5730 Diverticulosis of large intestine without perforation or abscess without bleeding: Secondary | ICD-10-CM | POA: Diagnosis not present

## 2021-12-26 DIAGNOSIS — R197 Diarrhea, unspecified: Secondary | ICD-10-CM | POA: Diagnosis not present

## 2021-12-26 DIAGNOSIS — K766 Portal hypertension: Secondary | ICD-10-CM

## 2021-12-26 DIAGNOSIS — I8510 Secondary esophageal varices without bleeding: Secondary | ICD-10-CM | POA: Diagnosis not present

## 2021-12-26 DIAGNOSIS — K2970 Gastritis, unspecified, without bleeding: Secondary | ICD-10-CM | POA: Diagnosis not present

## 2021-12-26 DIAGNOSIS — K3189 Other diseases of stomach and duodenum: Secondary | ICD-10-CM | POA: Diagnosis not present

## 2021-12-26 MED ORDER — SODIUM CHLORIDE 0.9 % IV SOLN: 500.0000 mL | Freq: Once | INTRAVENOUS | Status: DC

## 2021-12-26 NOTE — Patient Instructions (Addendum)
Handouts on hemorrhoids, diverticulosis, gastritis, and high fiber diet given to patient. Await pathology results. Resume previous diet and continue present medications. Recommended to use Benefiber/Metamucil 1-2 times daily for a few weeks to see if it'll bulk up the stool more significantly. Repeat colonoscopy in 10 years for screening purposes. Return to GI clinic in 3-5 weeks to discuss further treatment.    YOU HAD AN ENDOSCOPIC PROCEDURE TODAY AT [facility]:   Refer to the procedure report that was given to you for any specific questions about what was found during the examination.  If the procedure report does not answer your questions, please call your gastroenterologist to clarify.  If you requested that your care partner not be given the details of your procedure findings, then the procedure report has been included in a sealed envelope for you to review at your convenience later.  YOU SHOULD EXPECT: Some feelings of bloating in the abdomen. Passage of more gas than usual.  Walking can help get rid of the air that was put into your GI tract during the procedure and reduce the bloating. If you had a lower endoscopy (such as a colonoscopy or flexible sigmoidoscopy) you may notice spotting of blood in your stool or on the toilet paper. If you underwent a bowel prep for your procedure, you may not have a normal bowel movement for a few days.  Please Note:  You might notice some irritation and congestion in your nose or some drainage.  This is from the oxygen used during your procedure.  There is no need for concern and it should clear up in a day or so.  SYMPTOMS TO REPORT IMMEDIATELY:  Following lower endoscopy (colonoscopy or flexible sigmoidoscopy):  Excessive amounts of blood in the stool  Significant tenderness or worsening of abdominal pains  Swelling of the abdomen that is new, acute  Fever of 100F or higher  Following upper endoscopy (EGD)  Vomiting of blood  or coffee ground material  New chest pain or pain under the shoulder blades  Painful or persistently difficult swallowing  New shortness of breath  Fever of 100F or higher  Black, tarry-looking stools  For urgent or emergent issues, a gastroenterologist can be reached at any hour by calling [phone]. Do not use MyChart messaging for urgent concerns.    DIET:  We do recommend a small meal at first, but then you may proceed to your regular diet.  Drink plenty of fluids but you should avoid alcoholic beverages for 24 hours.  ACTIVITY:  You should plan to take it easy for the rest of today and you should NOT DRIVE or use heavy machinery until tomorrow (because of the sedation medicines used during the test).    FOLLOW UP: Our staff will call the number listed on your records 48-72 hours following your procedure to check on you and address any questions or concerns that you may have regarding the information given to you following your procedure. If we do not reach you, we will leave a message.  We will attempt to reach you two times.  During this call, we will ask if you have developed any symptoms of COVID 19. If you develop any symptoms (ie: fever, flu-like symptoms, shortness of breath, cough etc.) before then, please call [phone].  If you test positive for Covid 19 in the 2 weeks post procedure, please call and report this information to [place].    If any biopsies were taken you will be  contacted by phone or by letter within the next 1-3 weeks.  Please call us at [phone] if you have not heard about the biopsies in 3 weeks.    SIGNATURES/CONFIDENTIALITY: You and/or your care partner have signed paperwork which will be entered into your electronic medical record.  These signatures attest to the fact that that the information above on your After Visit Summary has been reviewed and is understood.  Full responsibility of the confidentiality of this discharge information lies with you  and/or your care-partner.

## 2021-12-26 NOTE — Progress Notes (Unsigned)
VS by DT  Pt's states no medical or surgical changes since previsit or office visit.  

## 2021-12-26 NOTE — Progress Notes (Unsigned)
GASTROENTEROLOGY PROCEDURE H&P NOTE   Primary Care Physician: Philmore Pali, NP  HPI: Emma Lewis is a 71 y.o. female who presents for EGD/Colonoscopy for evaluation of portal hypertension with variceal screening, GERD, diarrhea evaluation.  Past Medical History:  Diagnosis Date   Allergies    Arthritis    "all over my body" (04/29/2018)   Chronic lower back pain    Fatty liver    Fever blister    Takes Acyclovir   GERD (gastroesophageal reflux disease)    Hyperlipemia    Hypothyroidism    Kidney stones    Migraines    Osteopenia    Pneumonia    "once; years ago" (04/29/2018)   PONV (postoperative nausea and vomiting)    "dry heaves"   Pre-diabetes    Seasonal allergies    Vertigo    Past Surgical History:  Procedure Laterality Date   ANTERIOR CERVICAL DECOMP/DISCECTOMY FUSION N/A 02/09/2014   Procedure: ANTERIOR CERVICAL DECOMPRESSION/DISCECTOMY FUSION 2 LEVELS;  Surgeon: [name], MD;  Location: [place];  Service: Orthopedics;  Laterality: N/A;  C4-5, C5-6 Anterior Cervical Discectomy and Fusion, Allograft, Plate   BACK SURGERY     CARPAL TUNNEL RELEASE Right 2018   CATARACT EXTRACTION W/ INTRAOCULAR LENS  IMPLANT, BILATERAL     COLONOSCOPY     FIXATION KYPHOPLASTY LUMBAR SPINE  X 2   HIP ARTHROPLASTY Left 2011   INCISION AND DRAINAGE HIP Left 06/19/2018   Procedure: IRRIGATION AND DEBRIDEMENT LEFT HIP HEMATOMA ANTIBIOTIC BEADS, APPLICATION ON WOUND VAC;  Surgeon: [name], MD;  Location: [place];  Service: Orthopedics;  Laterality: Left;   JOINT REPLACEMENT     KNEE ARTHROSCOPY Right    ORIF PERIPROSTHETIC FRACTURE Left 06/01/2018   Procedure: OPEN REDUCTION INTERNAL FIXATION (ORIF) PERIPROSTHETIC FEMUR FRACTURE;  Surgeon: [name], MD;  Location: [place];  Service: Orthopedics;  Laterality: Left;   PATELLA FRACTURE SURGERY Right ~ 1990   "put metal in"   PATELLA HARDWARE REMOVAL Right    "took the metal out"   [place] Left  04/29/2018   ROBOTIC ASSISTED BILATERAL SALPINGO OOPHERECTOMY Bilateral 08/01/2015   Procedure: ROBOTIC ASSISTED BILATERAL SALPINGO OOPHORECTOMY;  Surgeon: Everitt Amber, MD;  Location: [facility];  Service: Gynecology;  Laterality: Bilateral;   SHOULDER OPEN ROTATOR CUFF REPAIR Right    3 TOTAL   SHOULDER SURGERY Right    TOTAL HIP ARTHROPLASTY Right 01/21/2018   Procedure: RIGHT TOTAL HIP ARTHROPLASTY DIRECT ANTERIOR;  Surgeon: [name], MD;  Location: [place];  Service: Orthopedics;  Laterality: Right;   TOTAL HIP REVISION Left 06/10/2016   Procedure: TOTAL HIP REVISION ARTHROPLASTY;  Surgeon: Rod Can, MD;  Location: [place];  Service: Orthopedics;  Laterality: Left;   TOTAL HIP REVISION Left 04/29/2018   Procedure: left total hip arthroplasty revision posterior approach;  Surgeon: [name], MD;  Location: [place];  Service: Orthopedics;  Laterality: Left;   TOTAL HIP REVISION Left 09/21/2018   Procedure: left total hip arthroplasty femoral revision;  Surgeon: [name], MD;  Location: [place];  Service: Orthopedics;  Laterality: Left;   WRIST GANGLION EXCISION Left 1970s   Current Outpatient Medications  Medication Sig Dispense Refill   acyclovir (ZOVIRAX) 400 MG tablet Take 1 tablet (400 mg total) by mouth 2 (two) times daily. (Patient taking differently: Take 400 mg by mouth See admin instructions. Take one tablet (400 mg) by mouth daily, may increase to one tablet (400  mg) twice daily as needed for fever blisters.) 60 tablet 0   atorvastatin (LIPITOR) 40 MG tablet Take 40 mg by mouth daily.     Calcium Carb-Cholecalciferol (CALCIUM 600 + D PO) Take 1 tablet by mouth 3 (three) times daily.      Cholecalciferol (VITAMIN D3) 2000 units TABS Take 2,000 Units by mouth 2 (two) times daily.      colestipol (COLESTID) 1 g tablet Take 1 tablet (1 g total) by mouth 2 (two) times daily as needed (diarrhea- stop if causes constipation). 30 tablet 0   levothyroxine (SYNTHROID, LEVOTHROID) 100 MCG  tablet Take 1 tablet (100 mcg total) by mouth daily before breakfast. 30 tablet 0   omeprazole (PRILOSEC OTC) 20 MG tablet Take 10 mg by mouth daily.     TYMLOS 3120 MCG/1.56ML SOPN Inject into the skin.     vancomycin (VANCOCIN) 125 MG capsule Take 1 capsule (125 mg total) by mouth 4 (four) times daily for 12 days, THEN 1 capsule (125 mg total) 2 (two) times daily for 7 days, THEN 1 capsule (125 mg total) daily for 7 days, THEN 1 capsule (125 mg total) every other day for 14 days. 76 capsule 0   Current Facility-Administered Medications  Medication Dose Route Frequency Provider Last Rate Last Admin   0.9 %  sodium chloride infusion  500 mL Intravenous Once Mansouraty, Telford Nab., MD        Current Outpatient Medications:    acyclovir (ZOVIRAX) 400 MG tablet, Take 1 tablet (400 mg total) by mouth 2 (two) times daily. (Patient taking differently: Take 400 mg by mouth See admin instructions. Take one tablet (400 mg) by mouth daily, may increase to one tablet (400 mg) twice daily as needed for fever blisters.), Disp: 60 tablet, Rfl: 0   atorvastatin (LIPITOR) 40 MG tablet, Take 40 mg by mouth daily., Disp: , Rfl:    Calcium Carb-Cholecalciferol (CALCIUM 600 + D PO), Take 1 tablet by mouth 3 (three) times daily. , Disp: , Rfl:    Cholecalciferol (VITAMIN D3) 2000 units TABS, Take 2,000 Units by mouth 2 (two) times daily. , Disp: , Rfl:    colestipol (COLESTID) 1 g tablet, Take 1 tablet (1 g total) by mouth 2 (two) times daily as needed (diarrhea- stop if causes constipation)., Disp: 30 tablet, Rfl: 0   levothyroxine (SYNTHROID, LEVOTHROID) 100 MCG tablet, Take 1 tablet (100 mcg total) by mouth daily before breakfast., Disp: 30 tablet, Rfl: 0   omeprazole (PRILOSEC OTC) 20 MG tablet, Take 10 mg by mouth daily., Disp: , Rfl:    TYMLOS 3120 MCG/1.56ML SOPN, Inject into the skin., Disp: , Rfl:    vancomycin (VANCOCIN) 125 MG capsule, Take 1 capsule (125 mg total) by mouth 4 (four) times daily for 12  days, THEN 1 capsule (125 mg total) 2 (two) times daily for 7 days, THEN 1 capsule (125 mg total) daily for 7 days, THEN 1 capsule (125 mg total) every other day for 14 days., Disp: 76 capsule, Rfl: 0  Current Facility-Administered Medications:    0.9 %  sodium chloride infusion, 500 mL, Intravenous, Once, Mansouraty, Telford Nab., MD Allergies  Allergen Reactions   Sulfa Antibiotics Other (See Comments)     Childhood allergy Mother said "I liked to have died" UNSPECIFIED "SEVERE CHILDHOOD REACTION"   Family History  Problem Relation Age of Onset   Lymphoma Mother    Arthritis Mother    Hyperlipidemia Mother    Colon polyps Father  Diabetes Father    Cirrhosis Father    Hyperlipidemia Father    Colon cancer Neg Hx    Stomach cancer Neg Hx    Esophageal cancer Neg Hx    Social History   Socioeconomic History   Marital status: Married    Spouse name: Not on file   Number of children: 1   Years of education: Not on file   Highest education level: Not on file  Occupational History   Not on file  Tobacco Use   Smoking status: Never   Smokeless tobacco: Never  Vaping Use   Vaping Use: Never used  Substance and Sexual Activity   Alcohol use: Not Currently    Comment: `   Drug use: Never   Sexual activity: Not Currently  Other Topics Concern   Not on file  Social History Narrative   Not on file   Social Determinants of Health   Financial Resource Strain: Not on file  Food Insecurity: Not on file  Transportation Needs: Not on file  Physical Activity: Not on file  Stress: Not on file  Social Connections: Not on file  Intimate Partner Violence: Not on file    Physical Exam: Today's Vitals   12/26/21 1416  BP: (!) 111/59  Pulse: 85  Temp: (!) 97.1 F (36.2 C)  TempSrc: Skin  SpO2: 98%  Weight: 170 lb (77.1 kg)  Height: '5\' 3"'$  (1.6 m)   Body mass index is 30.11 kg/m. GEN: NAD EYE: Sclerae anicteric ENT: MMM CV: Non-tachycardic GI: Soft, NT/ND NEURO:   Alert & Oriented x 3  Lab Results: No results for input(s): WBC, HGB, HCT, PLT in the last 72 hours. BMET No results for input(s): NA, K, CL, CO2, GLUCOSE, BUN, CREATININE, CALCIUM in the last 72 hours. LFT No results for input(s): PROT, ALBUMIN, AST, ALT, ALKPHOS, BILITOT, BILIDIR, IBILI in the last 72 hours. PT/INR No results for input(s): LABPROT, INR in the last 72 hours.   Impression / Plan: This is a 71 y.o.female who presents for EGD/Colonoscopy for evaluation of portal hypertension with variceal screening, GERD, diarrhea evaluation.  The risks and benefits of endoscopic evaluation/treatment were discussed with the patient and/or family; these include but are not limited to the risk of perforation, infection, bleeding, missed lesions, lack of diagnosis, severe illness requiring hospitalization, as well as anesthesia and sedation related illnesses.  The patient's history has been reviewed, patient examined, no change in status, and deemed stable for procedure.  The patient and/or family is agreeable to proceed.    Justice Britain, MD [facility] Office # [phone]

## 2021-12-26 NOTE — Op Note (Signed)
[facility] Patient Name: Emma Lewis Procedure Date: 12/26/2021 2:46 PM MRN: [id number] Endoscopist: Justice Britain , MD Age: 71 Referring MD:  Date of Birth: September 29, 1950 Gender: Female Account #: [id number] Procedure:                Upper GI endoscopy Indications:              Heartburn, Cirrhosis rule out esophageal varices Medicines:                Monitored Anesthesia Care Procedure:                Pre-Anesthesia Assessment:                           - Prior to the procedure, a History and Physical                            was performed, and patient medications and                            allergies were reviewed. The patient's tolerance of                            previous anesthesia was also reviewed. The risks                            and benefits of the procedure and the sedation                            options and risks were discussed with the patient.                            All questions were answered, and informed consent                            was obtained. Prior Anticoagulants: The patient has                            taken no previous anticoagulant or antiplatelet                            agents. ASA Grade Assessment: III - A patient with                            severe systemic disease. After reviewing the risks                            and benefits, the patient was deemed in                            satisfactory condition to undergo the procedure.                           After obtaining informed consent, the endoscope was  passed under direct vision. Throughout the                            procedure, the patient's blood pressure, pulse, and                            oxygen saturations were monitored continuously. The                            GIF [id number] #[id number] was introduced through the                            mouth, and advanced to the second part of duodenum.                            The  upper GI endoscopy was accomplished without                            difficulty. The patient tolerated the procedure. Scope In: Scope Out: Findings:                 No gross lesions were noted in the proximal                            esophagus and in the mid esophagus.                           Grade I and a single column of grade II varices                            were found in the distal esophagus.                           The Z-line was regular and was found 34 cm from the                            incisors.                           Mild portal hypertensive gastropathy was found in                            the cardia, in the gastric fundus and in the                            gastric body.                           Patchy moderately erythematous mucosa without                            bleeding was found in the entire examined stomach.                            Biopsies were  taken with a cold forceps for                            histology and Helicobacter pylori testing.                           There is no endoscopic evidence of varices in the                            entire examined stomach.                           No gross lesions were noted in the duodenal bulb,                            in the first portion of the duodenum and in the                            second portion of the duodenum. Complications:            No immediate complications. Estimated Blood Loss:     Estimated blood loss was minimal. Impression:               - No gross lesions in esophagus proximally. Grade I                            and grade II esophageal varices noted distally.                           - Z-line regular, 34 cm from the incisors.                           - Portal hypertensive gastropathy proximally.                           - Erythematous mucosa in the stomach. Biopsied.                           - No gross lesions in the duodenal bulb, in the                             first portion of the duodenum and in the second                            portion of the duodenum. Recommendation:           - Proceed to scheduled colonoscopy.                           - If BP and HR can tolerate, would recommend                            initiation of low-dose NSBB (Nadolol or Propanolol)  vs Carvedilol in effort of decreasing progression                            of EVs and decrease risk of bleeding in the future.                           - Continue present medications.                           - Return to GI clinic in next 3-5 weeks to discuss                            this further.                           - The findings and recommendations were discussed                            with the patient.                           - The findings and recommendations were discussed                            with the patient's family. Justice Britain, MD 12/26/2021 3:25:53 PM

## 2021-12-26 NOTE — Progress Notes (Unsigned)
Pt awake, report to RN, VVS  °

## 2021-12-26 NOTE — Op Note (Addendum)
[facility] Patient Name: Emma Lewis Procedure Date: 12/26/2021 2:45 PM MRN: [id number] Endoscopist: Justice Britain , MD Age: 71 Referring MD:  Date of Birth: [birth year] Gender: Female Account #: [id number] Procedure:                Colonoscopy Indications:              Chronic diarrhea, Clinically significant diarrhea                            of unexplained origin Medicines:                Monitored Anesthesia Care Procedure:                Pre-Anesthesia Assessment:                           - Prior to the procedure, a History and Physical                            was performed, and patient medications and                            allergies were reviewed. The patient's tolerance of                            previous anesthesia was also reviewed. The risks                            and benefits of the procedure and the sedation                            options and risks were discussed with the patient.                            All questions were answered, and informed consent                            was obtained. Prior Anticoagulants: The patient has                            taken no previous anticoagulant or antiplatelet                            agents. ASA Grade Assessment: III - A patient with                            severe systemic disease. After reviewing the risks                            and benefits, the patient was deemed in                            satisfactory condition to undergo the procedure.  After obtaining informed consent, the colonoscope                            was passed under direct vision. Throughout the                            procedure, the patient's blood pressure, pulse, and                            oxygen saturations were monitored continuously. The                            CF [id number] #[id number] was introduced through the anus                            and advanced to the 5 cm into the  ileum. The                            colonoscopy was somewhat difficult due to                            significant looping. Successful completion of the                            procedure was aided by changing the patient's                            position, using manual pressure, straightening and                            shortening the scope to obtain bowel loop reduction                            and using scope torsion. The patient tolerated the                            procedure. The quality of the bowel preparation was                            adequate. The terminal ileum, ileocecal valve,                            appendiceal orifice, and rectum were photographed. Scope In: 2:57:15 PM Scope Out: 3:14:43 PM Scope Withdrawal Time: 0 hours 12 minutes 25 seconds  Total Procedure Duration: 0 hours 17 minutes 28 seconds  Findings:                 The digital rectal exam findings include                            hemorrhoids. Pertinent negatives include no                            palpable rectal lesions.  The colon (entire examined portion) revealed                            significantly excessive looping.                           The terminal ileum and ileocecal valve appeared                            normal.                           Multiple small-mouthed diverticula were found in                            the entire colon.                           Normal mucosa was found in the entire colon                            otherwise. Biopsies for histology were taken with a                            cold forceps from the entire colon for evaluation                            of microscopic colitis.                           Non-bleeding non-thrombosed external and internal                            hemorrhoids were found during retroflexion, during                            perianal exam and during digital exam. The                             hemorrhoids were Grade II (internal hemorrhoids                            that prolapse but reduce spontaneously). Complications:            No immediate complications. Estimated Blood Loss:     Estimated blood loss was minimal. Impression:               - Hemorrhoids found on digital rectal exam.                           - There was significant looping of the colon.                           - The examined portion of the ileum was normal.                           -  Multiple small-mouthed diverticula were found in                            the entire colon.                           - Normal mucosa in the entire examined colon                            otherwise. Biopsied.                           - Non-bleeding non-thrombosed external and internal                            hemorrhoids. Recommendation:           - The patient will be observed post-procedure,                            until all discharge criteria are met.                           - Discharge patient to home.                           - Patient has a contact number available for                            emergencies. The signs and symptoms of potential                            delayed complications were discussed with the                            patient. Return to normal activities tomorrow.                            Written discharge instructions were provided to the                            patient.                           - High fiber diet.                           - Use Benefiber/Metamucil 1-2 times daily. Try this                            for a few weeks to see if we can bulk the stool                            more significantly. Be prepared for increased  bloating that can occur however at times.                           - Continue present medications.                           - Await pathology results.                           - Repeat colonoscopy in 10  years for screening                            purposes.                           - The findings and recommendations were discussed                            with the patient.                           - The findings and recommendations were discussed                            with the patient's family. Justice Britain, MD 12/26/2021 3:30:49 PM

## 2021-12-26 NOTE — Progress Notes (Unsigned)
Called to room to assist during endoscopic procedure.  Patient ID and intended procedure confirmed with present staff. Received instructions for my participation in the procedure from the performing physician.  

## 2022-01-02 ENCOUNTER — Encounter: Payer: Self-pay | Admitting: Gastroenterology

## 2022-01-08 ENCOUNTER — Encounter: Payer: Self-pay | Admitting: Gastroenterology

## 2022-01-08 DIAGNOSIS — Z20828 Contact with and (suspected) exposure to other viral communicable diseases: Secondary | ICD-10-CM | POA: Diagnosis not present

## 2022-01-09 ENCOUNTER — Other Ambulatory Visit: Payer: Self-pay

## 2022-01-09 MED ORDER — BUDESONIDE 3 MG PO CPEP: 9.0000 mg | ORAL_CAPSULE | Freq: Every day | ORAL | 6 refills | Status: DC

## 2022-01-09 NOTE — Telephone Encounter (Signed)
Dr Rush Landmark the pt is interested in starting budesonide as recommended in the letter sent to her.  Please advise

## 2022-01-09 NOTE — Telephone Encounter (Signed)
Please begin budesonide 9 mg daily. Follow-up in clinic in 3 to 5 weeks with APP or myself. Thanks. GM

## 2022-01-11 DIAGNOSIS — M8580 Other specified disorders of bone density and structure, unspecified site: Secondary | ICD-10-CM | POA: Diagnosis not present

## 2022-01-11 DIAGNOSIS — Z139 Encounter for screening, unspecified: Secondary | ICD-10-CM | POA: Diagnosis not present

## 2022-01-11 DIAGNOSIS — B372 Candidiasis of skin and nail: Secondary | ICD-10-CM | POA: Diagnosis not present

## 2022-01-11 DIAGNOSIS — B001 Herpesviral vesicular dermatitis: Secondary | ICD-10-CM | POA: Diagnosis not present

## 2022-01-11 DIAGNOSIS — E559 Vitamin D deficiency, unspecified: Secondary | ICD-10-CM | POA: Diagnosis not present

## 2022-01-11 DIAGNOSIS — E785 Hyperlipidemia, unspecified: Secondary | ICD-10-CM | POA: Diagnosis not present

## 2022-01-11 DIAGNOSIS — D649 Anemia, unspecified: Secondary | ICD-10-CM | POA: Diagnosis not present

## 2022-01-11 DIAGNOSIS — Z6828 Body mass index (BMI) 28.0-28.9, adult: Secondary | ICD-10-CM | POA: Diagnosis not present

## 2022-01-11 DIAGNOSIS — R7303 Prediabetes: Secondary | ICD-10-CM | POA: Diagnosis not present

## 2022-01-11 DIAGNOSIS — E039 Hypothyroidism, unspecified: Secondary | ICD-10-CM | POA: Diagnosis not present

## 2022-01-11 DIAGNOSIS — I700 Atherosclerosis of aorta: Secondary | ICD-10-CM | POA: Diagnosis not present

## 2022-01-27 ENCOUNTER — Encounter: Payer: Self-pay | Admitting: Gastroenterology

## 2022-01-28 NOTE — Telephone Encounter (Signed)
Called and spoke with patient regarding her concerns. I gave her Emma Lewis's recommendations again. Pt also reports that she is using IB Donald Prose which seems to be helpful. I told pt that she can continue this. Pt would like a call back with recommendations. Pt is aware that Dr. Rush Landmark is out of the office until later this week. Pt is aware that we will get back with her with Dr. Donneta Romberg recommendations. Pt verbalized understanding and had no concerns at the end of the call.

## 2022-01-28 NOTE — Telephone Encounter (Signed)
See alternate patient message for details.

## 2022-01-28 NOTE — Telephone Encounter (Signed)
Emma Lewis, this patient sent a MyChart message stating that she is having a reaction to Budesonide. Please see alternate patient message for details on reaction.  "The Budesonide capsules are causing a reaction excessive swelling in my legs,having trouble with my breathing and a rash inside my mouth.And fluid retention causing weight gain.I need an alternative to the Budesonide." Dr. Rush Landmark is out of the office until 01/30/22 and he does not have any sooner appts available. Your next available appt is 02/12/22. Are you able to help? Please advise, thanks.

## 2022-01-30 NOTE — Telephone Encounter (Signed)
Thanks for update. GM 

## 2022-01-30 NOTE — Telephone Encounter (Signed)
Emma Lewis, Please let the patient know that I agree with [facility] and her recommendations.  Certainly if she is having any significant issues in regards to persisting symptoms of trouble breathing, tongue or lip swelling then budesonide should be off completely.  He would be extremely unlikely to have a reaction such as this to a steroid as these are the normal medications we would use for the treatment of an allergic type reaction.  However, I think we have a good opportunity with the bismuth to try and help her.  Please give [place] an update as to where things stand. Thanks. GM

## 2022-01-30 NOTE — Telephone Encounter (Signed)
I spoke with the pt- she is off the budesonide completely. Her symptoms are slowly resolving.  Her legs are not as swollen, SOB/tightness is better, mouth sores have resolved.  She has been advised to call or go to the ED if her symptoms return.

## 2022-02-01 ENCOUNTER — Other Ambulatory Visit: Payer: Self-pay

## 2022-02-01 DIAGNOSIS — R197 Diarrhea, unspecified: Secondary | ICD-10-CM

## 2022-02-01 MED ORDER — DIPHENOXYLATE-ATROPINE 2.5-0.025 MG PO TABS: 1.0000 | ORAL_TABLET | Freq: Four times a day (QID) | ORAL | 0 refills | Status: DC | PRN

## 2022-02-01 NOTE — Telephone Encounter (Signed)
Emma Lewis, Patient should try to come in and get some labs today including a CBC/CMP.  She previously had fecal studies for pancreatic elastase and fecal fat that were pending.  Although she was negative for C. difficile 2 months ago is probably worthwhile for [place] to have her drop a stool sample off and rule out a C. difficile infection as a reason for her persisting symptoms.  She can be prescribed Lomotil 1 to 3 tablets daily in an effort of trying to decrease the diarrhea.  The biggest concern is that if she has a C. difficile infection we could make things worse but we will get the results of that until she gives a stool sample.  If she does not want to use the Lomotil then can do Imodium 2 pills in the morning (4 mg total first dose) and use up to 10 mg/day. If she feels that her symptoms are worsening or progressing then over the weekend she may need to go to urgent care or the emergency department. Thanks. GM

## 2022-02-12 ENCOUNTER — Ambulatory Visit (INDEPENDENT_AMBULATORY_CARE_PROVIDER_SITE_OTHER): Payer: Medicare Other | Admitting: Orthopaedic Surgery

## 2022-02-12 ENCOUNTER — Ambulatory Visit (INDEPENDENT_AMBULATORY_CARE_PROVIDER_SITE_OTHER): Payer: Medicare Other

## 2022-02-12 ENCOUNTER — Encounter: Payer: Self-pay | Admitting: Orthopaedic Surgery

## 2022-02-12 VITALS — BP 119/74 | HR 80 | Ht 63.0 in | Wt 170.0 lb

## 2022-02-12 DIAGNOSIS — Z96649 Presence of unspecified artificial hip joint: Secondary | ICD-10-CM | POA: Diagnosis not present

## 2022-02-12 DIAGNOSIS — M79605 Pain in left leg: Secondary | ICD-10-CM | POA: Diagnosis not present

## 2022-02-12 NOTE — Progress Notes (Signed)
Office Visit Note   Patient: Emma Lewis           Date of Birth: 09/17/1950           MRN: [id number] Visit Date: 02/12/2022              Requested by: Philmore Pali, NP [address] [place],  [place] [address] PCP: Philmore Pali, NP   Assessment & Plan: Visit Diagnoses:  1. History of revision of total hip arthroplasty   2. Pain in left leg     Plan: Long discussion with patient review multiple images including 09/21/2018 postop images and 05/24/2020 and 03/29/2021 as well as today's images.  This shows the femoral stem despite being an 8 inch AML with Vanc cement  with the proximal coating and distal cement has slowly and gradually subsided. Tip of the stem is one half way thru the cement restrictor.  Recommend she use her walker to decrease stress . Fall provention discussed. Discussed with her that next procedure might be referral for proximal femoral replacement. She has poor bone quality and that could also have significant failure rate. She lost 20 to 30 lbs with diarrhea and decrease appetite. Nutrition is improved now.  I plan to recheck her in 6 months we will repeat x-rays of the left femur.  We discussed the use of the walker it might be that subsidence might stop.  She does not have any lucency between the cement prosthesis interface.  Follow-Up Instructions: No follow-ups on file.   Orders:  Orders Placed This Encounter  Procedures   XR HIP UNILAT W OR W/O PELVIS 2-3 VIEWS LEFT   XR KNEE 3 VIEW LEFT   No orders of the defined types were placed in this encounter.     Procedures: No procedures performed   Clinical Data: No additional findings.   Subjective: Chief Complaint  Patient presents with   Left Leg - Pain    HPI 71 year old female returns she states she has been having more thigh pain in the supracondylar region of her femur.  Repeat x-rays are obtained today.  Patient had left femoral stem revision 09/21/2018 which was 1 of 5 or 6 operations she has  had.  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  She underwent revision surgery by me later had postop infection with revision antibiotic beads washout procedure later reimplantation with periprosthetic femur fracture treated with longer stem.  She then had another periprosthetic fracture requiring long stem with cement distal cement restrictor and also long lateral Zimmer periprosthetic plate then went from the trochanter almost down to the knee.  She was fixed with multiple cables and distal screws.  Surgery last procedure was 09/21/2018.  New x-rays taken today show she has had some femoral stem subsidence.  Patient states she has had problems with more than 2 months worth of diarrhea.  She has been followed by [facility] gastroenterology and she states she thinks she was told they felt she might of had Clostridium difficile possibly.  She was on some oral vancomycin.  She states the diarrhea is better she has been told she is anemic.  She is on calcium plus vitamin D and also gets a yearly injection for osteoporosis by Dr. Layne Benton.  She has had several lumbar compression fractures.  She has walker at home also a  rolling walker currently using a cane.  She states when she loads her upper extremity she has increased right shoulder pain.  Review of Systems osteoporosis with lumbar compression fractures.  Right total hip arthroplasty doing well.  Left periprosthetic fracture at the tip in 2020.  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  Recent problems with diarrhea improved currently.  Hypoalbuminemia.   Objective: Vital Signs: BP 119/74   Pulse 80   Ht '5\' 3"'$  (1.6 m)   Wt 170 lb (77.1 kg)   BMI 30.11 kg/m   Physical Exam Constitutional:      Appearance: She is well-developed.  HENT:     Head: Normocephalic.     Right Ear: External ear  normal.     Left Ear: External ear normal. There is no impacted cerumen.  Eyes:     Pupils: Pupils are equal, round, and reactive to light.  Neck:     Thyroid: No thyromegaly.     Trachea: No tracheal deviation.  Cardiovascular:     Rate and Rhythm: Normal rate.  Pulmonary:     Effort: Pulmonary effort is normal.  Abdominal:     Palpations: Abdomen is soft.  Musculoskeletal:     Cervical back: No rigidity.  Skin:    General: Skin is warm and dry.  Neurological:     Mental Status: She is alert and oriented to person, place, and time.  Psychiatric:        Behavior: Behavior normal.     Ortho Exam patient has slight shortening left thigh.  Crepitus with knee range of motion.  Palpable distal pulses.  Sciatic sensation is intact.  Patient ambulates with a cane.  Specialty Comments:  No specialty comments available.  Imaging: No results found.   PMFS History: Patient Active Problem List   Diagnosis Date Noted   Cirrhosis of liver without ascites ([place]) 12/12/2021   Lumbar compression fracture ([place]) 12/26/2020   Unilateral primary osteoarthritis, left knee 12/10/2019   Thoracic compression fracture ([place]) 12/11/2018   Symptomatic anemia    Ambulatory dysfunction 07/21/2018   Weakness of both legs 07/21/2018   Proteus mirabilis infection    Hip hematoma, left 06/19/2018   Hematoma of left hip 06/19/2018   Hip dislocation, left ([place]) 05/29/2018   Peri-prosthetic femur fracture at tip of prosthesis 05/29/2018   Prediabetes    Popping sound of knee joint    Hypoalbuminemia due to protein-calorie malnutrition (HCC)    Transaminitis    Leukocytosis    Postoperative pain    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 ([place]) 05/01/2018   Acute blood loss anemia    Leukemoid reaction    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   Joint effusion of pelvis or thigh, left 04/22/2018   Trochanteric bursitis, left hip 04/22/2018   History of revision of total hip arthroplasty  11/12/2017   GERD (gastroesophageal reflux disease) 01/17/2017   Hypothyroidism 01/17/2017   Vertigo 01/16/2017   Nausea and vomiting 01/16/2017   Essential hypertension 01/16/2017   Fatty liver 01/16/2017   Failed total hip arthroplasty ([place]) 06/10/2016   Pelvic mass 06/15/2015   Closed wedge compression fracture of fifth lumbar vertebra (HCC)    Cervical spondylosis 02/09/2014   DDD (degenerative disc disease), lumbar 05/13/2013   OA (osteoarthritis) 05/13/2013   Past Medical History:  Diagnosis Date   Allergies    Arthritis    "all over my body" (04/29/2018)   Chronic lower back pain    Fatty  liver    Fever blister    Takes Acyclovir   GERD (gastroesophageal reflux disease)    Hyperlipemia    Hypothyroidism    Kidney stones    Migraines    Osteopenia    Pneumonia    "once; years ago" (04/29/2018)   PONV (postoperative nausea and vomiting)    "dry heaves"   Pre-diabetes    Seasonal allergies    Vertigo     Family History  Problem Relation Age of Onset   Lymphoma Mother    Arthritis Mother    Hyperlipidemia Mother    Colon polyps Father    Diabetes Father    Cirrhosis Father    Hyperlipidemia Father    Colon cancer Neg Hx    Stomach cancer Neg Hx    Esophageal cancer Neg Hx     Past Surgical History:  Procedure Laterality Date   ANTERIOR CERVICAL DECOMP/DISCECTOMY FUSION N/A 02/09/2014   Procedure: ANTERIOR CERVICAL DECOMPRESSION/DISCECTOMY FUSION 2 LEVELS;  Surgeon: [name], MD;  Location: [facility];  Service: Orthopedics;  Laterality: N/A;  C4-5, C5-6 Anterior Cervical Discectomy and Fusion, Allograft, Plate   BACK SURGERY     CARPAL TUNNEL RELEASE Right 2018   CATARACT EXTRACTION W/ INTRAOCULAR LENS  IMPLANT, BILATERAL     COLONOSCOPY     FIXATION KYPHOPLASTY LUMBAR SPINE  X 2   HIP ARTHROPLASTY Left 2011   INCISION AND DRAINAGE HIP Left 06/19/2018   Procedure: IRRIGATION AND DEBRIDEMENT LEFT HIP HEMATOMA ANTIBIOTIC BEADS, APPLICATION ON WOUND VAC;   Surgeon: [name], MD;  Location: [facility];  Service: Orthopedics;  Laterality: Left;   JOINT REPLACEMENT     KNEE ARTHROSCOPY Right    ORIF PERIPROSTHETIC FRACTURE Left 06/01/2018   Procedure: OPEN REDUCTION INTERNAL FIXATION (ORIF) PERIPROSTHETIC FEMUR FRACTURE;  Surgeon: [name], MD;  Location: [facility];  Service: Orthopedics;  Laterality: Left;   PATELLA FRACTURE SURGERY Right ~ 1990   "put metal in"   PATELLA HARDWARE REMOVAL Right    "took the metal out"   [place] Left 04/29/2018   ROBOTIC ASSISTED BILATERAL SALPINGO OOPHERECTOMY Bilateral 08/01/2015   Procedure: ROBOTIC ASSISTED BILATERAL SALPINGO OOPHORECTOMY;  Surgeon: Everitt Amber, MD;  Location[facility];  Service: Gynecology;  Laterality: Bilateral;   SHOULDER OPEN ROTATOR CUFF REPAIR Right    3 TOTAL   SHOULDER SURGERY Right    TOTAL HIP ARTHROPLASTY Right 01/21/2018   Procedure: RIGHT TOTAL HIP ARTHROPLASTY DIRECT ANTERIOR;  Surgeon: [name], MD;  Location: [place] [facility];  Service: Orthopedics;  Laterality: Right;   TOTAL HIP REVISION Left 06/10/2016   Procedure: TOTAL HIP REVISION ARTHROPLASTY;  Surgeon: Rod Can, MD;  Location: [facility];  Service: Orthopedics;  Laterality: Left;   TOTAL HIP REVISION Left 04/29/2018   Procedure: left total hip arthroplasty revision posterior approach;  Surgeon: [name], MD;  Location: [facility];  Service: Orthopedics;  Laterality: Left;   TOTAL HIP REVISION Left 09/21/2018   Procedure: left total hip arthroplasty femoral revision;  Surgeon: [name], MD;  Location: [facility];  Service: Orthopedics;  Laterality: Left;   WRIST GANGLION EXCISION Left 1970s   Social History   Occupational History   Not on file  Tobacco Use   Smoking status: Never   Smokeless tobacco: Never  Vaping Use   Vaping Use: Never used  Substance and Sexual Activity   Alcohol use: Not Currently    Comment: `   Drug use: Never  Sexual activity: Not Currently

## 2022-02-13 DIAGNOSIS — D649 Anemia, unspecified: Secondary | ICD-10-CM | POA: Diagnosis not present

## 2022-02-21 ENCOUNTER — Encounter: Payer: Self-pay | Admitting: Gastroenterology

## 2022-03-01 ENCOUNTER — Other Ambulatory Visit: Payer: Self-pay

## 2022-03-01 ENCOUNTER — Encounter: Payer: Self-pay | Admitting: Gastroenterology

## 2022-03-01 MED ORDER — DIPHENOXYLATE-ATROPINE 2.5-0.025 MG PO TABS: 1.0000 | ORAL_TABLET | Freq: Four times a day (QID) | ORAL | 0 refills | Status: DC | PRN

## 2022-03-05 ENCOUNTER — Encounter: Payer: Self-pay | Admitting: Gastroenterology

## 2022-03-05 ENCOUNTER — Other Ambulatory Visit (INDEPENDENT_AMBULATORY_CARE_PROVIDER_SITE_OTHER): Payer: Medicare Other

## 2022-03-05 ENCOUNTER — Ambulatory Visit (INDEPENDENT_AMBULATORY_CARE_PROVIDER_SITE_OTHER): Payer: Medicare Other | Admitting: Gastroenterology

## 2022-03-05 VITALS — BP 124/70 | HR 76 | Ht 62.5 in | Wt 167.2 lb

## 2022-03-05 DIAGNOSIS — K52831 Collagenous colitis: Secondary | ICD-10-CM | POA: Diagnosis not present

## 2022-03-05 DIAGNOSIS — K529 Noninfective gastroenteritis and colitis, unspecified: Secondary | ICD-10-CM

## 2022-03-05 DIAGNOSIS — I8510 Secondary esophageal varices without bleeding: Secondary | ICD-10-CM | POA: Diagnosis not present

## 2022-03-05 DIAGNOSIS — K7460 Unspecified cirrhosis of liver: Secondary | ICD-10-CM | POA: Diagnosis not present

## 2022-03-05 DIAGNOSIS — D649 Anemia, unspecified: Secondary | ICD-10-CM

## 2022-03-05 DIAGNOSIS — I8500 Esophageal varices without bleeding: Secondary | ICD-10-CM

## 2022-03-05 DIAGNOSIS — K219 Gastro-esophageal reflux disease without esophagitis: Secondary | ICD-10-CM | POA: Diagnosis not present

## 2022-03-05 DIAGNOSIS — N3946 Mixed incontinence: Secondary | ICD-10-CM | POA: Diagnosis not present

## 2022-03-05 DIAGNOSIS — K745 Biliary cirrhosis, unspecified: Secondary | ICD-10-CM

## 2022-03-05 LAB — COMPREHENSIVE METABOLIC PANEL
ALT: 24 U/L (ref 0–35)
AST: 48 U/L — ABNORMAL HIGH (ref 0–37)
Albumin: 3.7 g/dL (ref 3.5–5.2)
Alkaline Phosphatase: 182 U/L — ABNORMAL HIGH (ref 39–117)
BUN: 14 mg/dL (ref 6–23)
CO2: 26 mEq/L (ref 19–32)
Calcium: 10.2 mg/dL (ref 8.4–10.5)
Chloride: 106 mEq/L (ref 96–112)
Creatinine, Ser: 0.65 mg/dL (ref 0.40–1.20)
GFR: 87.68 mL/min (ref >60.00)
Glucose, Bld: 91 mg/dL (ref 70–99)
Potassium: 4.3 mEq/L (ref 3.5–5.1)
Sodium: 140 mEq/L (ref 135–145)
Total Bilirubin: 0.6 mg/dL (ref 0.2–1.2)
Total Protein: 7.1 g/dL (ref 6.0–8.3)

## 2022-03-05 LAB — CBC
HCT: 30.7 % — ABNORMAL LOW (ref 36.0–46.0)
Hemoglobin: 9.7 g/dL — ABNORMAL LOW (ref 12.0–15.0)
MCHC: 31.7 g/dL (ref 30.0–36.0)
MCV: 80.4 fl (ref 78.0–100.0)
Platelets: 221 10*3/uL (ref 150.0–400.0)
RBC: 3.82 Mil/uL — ABNORMAL LOW (ref 3.87–5.11)
RDW: 19.9 % — ABNORMAL HIGH (ref 11.5–15.5)
WBC: 7.1 10*3/uL (ref 4.0–10.5)

## 2022-03-05 LAB — B12 AND FOLATE PANEL
Folate: 6.6 ng/mL (ref >5.9)
Vitamin B-12: >1500 pg/mL — ABNORMAL HIGH (ref 211–911)

## 2022-03-05 LAB — IBC + FERRITIN
Ferritin: 20.4 ng/mL (ref 10.0–291.0)
Iron: 41 ug/dL — ABNORMAL LOW (ref 42–145)
Saturation Ratios: 7.6 % — ABNORMAL LOW (ref 20.0–50.0)
TIBC: 536.2 ug/dL — ABNORMAL HIGH (ref 250.0–450.0)
Transferrin: 383 mg/dL — ABNORMAL HIGH (ref 212.0–360.0)

## 2022-03-05 LAB — PROTIME-INR
INR: 1.2 ratio — ABNORMAL HIGH (ref 0.8–1.0)
Prothrombin Time: 13.3 s — ABNORMAL HIGH (ref 9.6–13.1)

## 2022-03-05 MED ORDER — DIPHENOXYLATE-ATROPINE 2.5-0.025 MG PO TABS: 1.0000 | ORAL_TABLET | Freq: Four times a day (QID) | ORAL | 0 refills | Status: AC | PRN

## 2022-03-05 NOTE — Patient Instructions (Addendum)
_______________________________________________________ If you are age 73 or older, your body mass index should be between 23-30. Your Body mass index is 30.1 kg/m. If this is out of the aforementioned range listed, please consider follow up with your Primary Care Provider. ________________________________________________________  The [facility] providers would like to encourage you to use [facility] to communicate with providers for non-urgent requests or questions.  Due to long hold times on the telephone, sending your provider a message by [facility] may be a faster and more efficient way to get a response.  Please allow 48 business hours for a response.  Please remember that this is for non-urgent requests.  _______________________________________________________  Your provider has requested that you go to the basement level for lab work before leaving today. Press "B" on the elevator. The lab is located at the first door on the left as you exit the elevator.  Due to recent changes in healthcare laws, you may see the results of your imaging and laboratory studies on MyChart before your provider has had a chance to review them.  We understand that in some cases there may be results that are confusing or concerning to you. Not all laboratory results come back in the same time frame and the provider may be waiting for multiple results in order to interpret others.  Please give [place] 48 hours in order for your provider to thoroughly review all the results before contacting the office for clarification of your results.   Thank you for entrusting me with your care and choosing [facility].  Dr Rush Landmark

## 2022-03-05 NOTE — Progress Notes (Unsigned)
[place] VISIT   Primary Care Provider Philmore Pali, NP [place] [address] [phone]  Referring Provider Philmore Pali, NP [address] [place],  [place] [address] [phone]  Patient Profile: Emma Lewis is a 71 y.o. female with a pmh significant for  The patient presents to the [facility] for an evaluation and management of problem(s) noted below:  Problem List 1. Hepatic cirrhosis, unspecified hepatic cirrhosis type, unspecified whether ascites present ([place])   2. Collagenous colitis   3. Chronic diarrhea   4. Anemia, unspecified type   5. Esophageal varices without bleeding, unspecified esophageal varices type ([place])     History of Present Illness Please see prior notes for full details of HPI.  Interval History  asdf  The patient does/does not take NSAIDs or BC/Goody Powder. Patient has/has not had an EGD. Patient has/has not had a Colonoscopy.  GI Review of Systems Positive as above Negative for  Pyrosis; Reflux; Regurgitation; Dysphagia; Odynophagia; Globus; Post-prandial cough; Nocturnal cough; Nasal regurgitation; Epigastric pain; Nausea; Vomiting; Hematemesis; Jaundice; Change in Appetite; Early satiety; Abdominal pain; Abdominal bloating; Eructation; Flatulence; Change in BM Frequency; Change in BM Consistency; Constipation; Diarrhea; Incontinence; Urgency; Tenesmus; Hematochezia; Melena  Review of Systems General: Denies fevers/chills/weight loss/night sweats HEENT: Denies oral lesions/sore throat/headaches/visual changes Cardiovascular: Denies chest pain/palpitations Pulmonary: Denies shortness of breath/cough Gastroenterological: See HPI Genitourinary: Denies darkened urine or hematuria Hematological: Denies easy bruising/bleeding Endocrine: Denies temperature intolerance Dermatological: Denies skin changes Psychological: Mood is stable Allergy & Immunology: Denies severe  allergic reactions Musculoskeletal: Denies new arthralgias   Medications Current Outpatient Medications  Medication Sig Dispense Refill   acyclovir (ZOVIRAX) 400 MG tablet Take 1 tablet (400 mg total) by mouth 2 (two) times daily. (Patient taking differently: Take 400 mg by mouth See admin instructions. Take one tablet (400 mg) by mouth daily, may increase to one tablet (400 mg) twice daily as needed for fever blisters.) 60 tablet 0   AMBULATORY NON FORMULARY MEDICATION Nutrafol Take 4 capsule by mouth daily     AMBULATORY NON FORMULARY MEDICATION Beet Root Take 2 capsule by mouth daily     AMBULATORY NON FORMULARY MEDICATION Liver and gallbladder support Take 1 capsule twice a day     atorvastatin (LIPITOR) 40 MG tablet Take 40 mg by mouth daily.     Calcium Carb-Cholecalciferol (CALCIUM 600 + D PO) Take 1 tablet by mouth 3 (three) times daily.      Cholecalciferol (VITAMIN D3) 2000 units TABS Take 2,000 Units by mouth 2 (two) times daily.      Coenzyme Q10 (CO Q10) 100 MG CAPS Take 1 capsule by mouth daily.     ibuprofen (ADVIL) 200 MG tablet Take 800 mg by mouth 2 (two) times daily.     levothyroxine (EUTHYROX) 100 MCG tablet Take 100 mcg by mouth daily before breakfast.     Menaquinone-7 (VITAMIN K2) 100 MCG CAPS Take 1 capsule by mouth daily.     omeprazole (PRILOSEC OTC) 20 MG tablet Take 10 mg by mouth daily.     Peppermint Oil (IBGARD) 90 MG CPCR Take by mouth. Take 2 capsules by mouth in the mornings and 1-2 tablets at night     Turmeric Curcumin 500 MG CAPS Take 1 capsule by mouth 2 (two) times daily.     TYMLOS 3120 MCG/1.56ML SOPN Inject into the skin.     colestipol (COLESTID) 1 g tablet Take 1 tablet (1 g total) by  mouth 2 (two) times daily as needed (diarrhea- stop if causes constipation). (Patient not taking: Reported on 03/05/2022) 30 tablet 0   diphenoxylate-atropine (LOMOTIL) 2.5-0.025 MG tablet Take 1-3 tablets by mouth 4 (four) times daily as needed for diarrhea or loose  stools. 30 tablet 0   No current facility-administered medications for this visit.    Allergies Allergies  Allergen Reactions   Entocort Ec [Budesonide] Shortness Of Breath and Swelling   Sulfa Antibiotics Other (See Comments)     Childhood allergy Mother said "I liked to have died" UNSPECIFIED "SEVERE CHILDHOOD REACTION"    Histories Past Medical History:  Diagnosis Date   Allergies    Arthritis    "all over my body" (04/29/2018)   Chronic lower back pain    Fatty liver    Fever blister    Takes Acyclovir   GERD (gastroesophageal reflux disease)    Hyperlipemia    Hypothyroidism    Kidney stones    Migraines    Osteopenia    Pneumonia    "once; years ago" (04/29/2018)   PONV (postoperative nausea and vomiting)    "dry heaves"   Pre-diabetes    Seasonal allergies    Vertigo    Past Surgical History:  Procedure Laterality Date   ANTERIOR CERVICAL DECOMP/DISCECTOMY FUSION N/A 02/09/2014   Procedure: ANTERIOR CERVICAL DECOMPRESSION/DISCECTOMY FUSION 2 LEVELS;  Surgeon: [name], MD;  Location: [facility];  Service: Orthopedics;  Laterality: N/A;  C4-5, C5-6 Anterior Cervical Discectomy and Fusion, Allograft, Plate   BACK SURGERY     CARPAL TUNNEL RELEASE Right 2018   CATARACT EXTRACTION W/ INTRAOCULAR LENS  IMPLANT, BILATERAL     COLONOSCOPY     FIXATION KYPHOPLASTY LUMBAR SPINE  X 2   HIP ARTHROPLASTY Left 2011   INCISION AND DRAINAGE HIP Left 06/19/2018   Procedure: IRRIGATION AND DEBRIDEMENT LEFT HIP HEMATOMA ANTIBIOTIC BEADS, APPLICATION ON WOUND VAC;  Surgeon: [name], MD;  Location: [facility];  Service: Orthopedics;  Laterality: Left;   JOINT REPLACEMENT     KNEE ARTHROSCOPY Right    ORIF PERIPROSTHETIC FRACTURE Left 06/01/2018   Procedure: OPEN REDUCTION INTERNAL FIXATION (ORIF) PERIPROSTHETIC FEMUR FRACTURE;  Surgeon: [name], MD;  Location: [facility];  Service: Orthopedics;  Laterality: Left;   PATELLA FRACTURE SURGERY Right ~ 1990   "put metal in"    PATELLA HARDWARE REMOVAL Right    "took the metal out"   [place] Left 04/29/2018   ROBOTIC ASSISTED BILATERAL SALPINGO OOPHERECTOMY Bilateral 08/01/2015   Procedure: ROBOTIC ASSISTED BILATERAL SALPINGO OOPHORECTOMY;  Surgeon: Everitt Amber, MD;  [facility];  Service: Gynecology;  Laterality: Bilateral;   SHOULDER OPEN ROTATOR CUFF REPAIR Right    3 TOTAL   SHOULDER SURGERY Right    TOTAL HIP ARTHROPLASTY Right 01/21/2018   Procedure: RIGHT TOTAL HIP ARTHROPLASTY DIRECT ANTERIOR;  Surgeon: [name], MD;  Location: [facility];  Service: Orthopedics;  Laterality: Right;   TOTAL HIP REVISION Left 06/10/2016   Procedure: TOTAL HIP REVISION ARTHROPLASTY;  Surgeon: Rod Can, MD;  Location: [facility];  Service: Orthopedics;  Laterality: Left;   TOTAL HIP REVISION Left 04/29/2018   Procedure: left total hip arthroplasty revision posterior approach;  Surgeon: [name], MD;  Location: [facility];  Service: Orthopedics;  Laterality: Left;   TOTAL HIP REVISION Left 09/21/2018   Procedure: left total hip arthroplasty femoral revision;  Surgeon: [name], MD;  Location: [facility];  Service: Orthopedics;  Laterality: Left;   WRIST GANGLION EXCISION Left 1970s   Social History   Socioeconomic History   Marital status: Married    Spouse name: Not on file   Number of children: 1   Years of education: Not on file   Highest education level: Not on file  Occupational History   Not on file  Tobacco Use   Smoking status: Never   Smokeless tobacco: Never  Vaping Use   Vaping Use: Never used  Substance and Sexual Activity   Alcohol use: Not Currently    Comment: `   Drug use: Never   Sexual activity: Not Currently  Other Topics Concern   Not on file  Social History Narrative   Not on file   Social Determinants of Health   Financial Resource Strain: Not on file  Food Insecurity: Not on file  Transportation Needs: Not on file  Physical Activity: Not on file  Stress: Not  on file  Social Connections: Not on file  Intimate Partner Violence: Not on file   Family History  Problem Relation Age of Onset   Lymphoma Mother    Arthritis Mother    Hyperlipidemia Mother    Colon polyps Father    Diabetes Father    Cirrhosis Father    Hyperlipidemia Father    Colon cancer Neg Hx    Stomach cancer Neg Hx    Esophageal cancer Neg Hx    Inflammatory bowel disease Neg Hx    Liver disease Neg Hx    Pancreatic cancer Neg Hx    Rectal cancer Neg Hx    I have reviewed her medical, social, and family history in detail and updated the electronic medical record as necessary.    PHYSICAL EXAMINATION  BP 124/70 (BP Location: Left Arm, Patient Position: Sitting, Cuff Size: Normal)   Pulse 76   Ht 5' 2.5" (1.588 m) Comment: height measured without shoes  Wt 167 lb 4 oz (75.9 kg)   BMI 30.10 kg/m  Wt Readings from Last 3 Encounters:  03/05/22 167 lb 4 oz (75.9 kg)  02/12/22 170 lb (77.1 kg)  12/26/21 170 lb (77.1 kg)   GEN: NAD, appears stated age, doesn't appear chronically ill PSYCH: Cooperative, without pressured speech EYE: Conjunctivae pink, sclerae anicteric ENT: MMM, without oral ulcers, no erythema or exudates noted NECK: Supple CV: RR without R/Gs  RESP: CTAB posteriorly, without wheezing GI: NABS, soft, NT/ND, without rebound or guarding, no HSM appreciated GU: DRE shows MSK/EXT: _ edema, no palmar erythema SKIN: No jaundice, no spider angiomata, no concerning rashes NEURO:  Alert & Oriented x 3, no focal deficits, no evidence of asterixis   REVIEW OF DATA  I reviewed the following data at the time of this encounter:  GI Procedures and Studies  May 2023 EGD - No gross lesions in esophagus proximally. Grade I and grade II esophageal varices noted distally. - Z-line regular, 34 cm from the incisors. - Portal hypertensive gastropathy proximally. - Erythematous mucosa in the stomach. Biopsied. - No gross lesions in the duodenal bulb, in the  first portion of the duodenum and in the second portion of the duodenum.  May 2023 colonoscopy - Hemorrhoids found on digital rectal exam. - There was significant looping of the colon. - The examined portion of the ileum was normal. - Multiple small-mouthed diverticula were found in the entire colon. - Normal mucosa in the entire examined colon otherwise. Biopsied. - Non-bleeding non-thrombosed external and internal hemorrhoids.  Pathology Diagnosis 1.  Surgical [P], gastric antrum and gastric body - GASTRIC ANTRAL MUCOSA WITH NONSPECIFIC REACTIVE GASTROPATHY - GASTRIC OXYNTIC MUCOSA WITH PARIETAL CELL HYPERPLASIA AS CAN BE SEEN IN HYPERGASTRINEMIC STATES SUCH AS PPI THERAPY. - HELICOBACTER PYLORI-LIKE ORGANISMS ARE NOT IDENTIFIED ON ROUTINE H&E STAIN 2. Surgical [P], colon nos, random sites - COLLAGENOUS COLITIS  Laboratory Studies  ***  Imaging Studies  ***   ASSESSMENT  Ms. Gallego is a 71 y.o. female with a pmh significant for The patient is seen today for evaluation and management of:  1. Hepatic cirrhosis, unspecified hepatic cirrhosis type, unspecified whether ascites present ([place])   2. Collagenous colitis   3. Chronic diarrhea   4. Anemia, unspecified type   5. Esophageal varices without bleeding, unspecified esophageal varices type ([facility])     ***   PLAN  There are no diagnoses linked to this encounter.   Orders Placed This Encounter  Procedures   CBC   Comp Met (CMET)   IBC + Ferritin   B12 and Folate Panel   INR/PT   ANA   Mitochondrial antibodies   Anti-smooth muscle antibody, IgG   Hepatitis A antibody, total   Hepatitis B surface antigen   Hepatitis B surface antibody,qualitative   Hepatitis B Core Antibody, IgM   Hepatitis C Antibody    New Prescriptions   No medications on file   Modified Medications   Modified Medication Previous Medication   DIPHENOXYLATE-ATROPINE (LOMOTIL) 2.5-0.025 MG TABLET diphenoxylate-atropine (LOMOTIL) 2.5-0.025  MG tablet      Take 1-3 tablets by mouth 4 (four) times daily as needed for diarrhea or loose stools.    Take 1-3 tablets by mouth 4 (four) times daily as needed for diarrhea or loose stools.    Planned Follow Up No follow-ups on file.   Total Time in Face-to-Face and in Coordination of Care for patient including independent/personal interpretation/review of prior testing, medical history, examination, medication adjustment, communicating results with the patient directly, and documentation within the EHR is ***.   Justice Britain, MD [facility] Office # [phone]

## 2022-03-06 ENCOUNTER — Other Ambulatory Visit: Payer: Self-pay

## 2022-03-06 ENCOUNTER — Other Ambulatory Visit: Payer: Medicare Other

## 2022-03-06 DIAGNOSIS — D649 Anemia, unspecified: Secondary | ICD-10-CM

## 2022-03-06 DIAGNOSIS — K745 Biliary cirrhosis, unspecified: Secondary | ICD-10-CM | POA: Insufficient documentation

## 2022-03-06 DIAGNOSIS — K529 Noninfective gastroenteritis and colitis, unspecified: Secondary | ICD-10-CM | POA: Insufficient documentation

## 2022-03-06 DIAGNOSIS — D509 Iron deficiency anemia, unspecified: Secondary | ICD-10-CM

## 2022-03-06 DIAGNOSIS — I8510 Secondary esophageal varices without bleeding: Secondary | ICD-10-CM | POA: Insufficient documentation

## 2022-03-06 DIAGNOSIS — R7989 Other specified abnormal findings of blood chemistry: Secondary | ICD-10-CM

## 2022-03-06 DIAGNOSIS — R197 Diarrhea, unspecified: Secondary | ICD-10-CM | POA: Diagnosis not present

## 2022-03-06 DIAGNOSIS — R945 Abnormal results of liver function studies: Secondary | ICD-10-CM

## 2022-03-06 DIAGNOSIS — K219 Gastro-esophageal reflux disease without esophagitis: Secondary | ICD-10-CM | POA: Insufficient documentation

## 2022-03-06 DIAGNOSIS — K52831 Collagenous colitis: Secondary | ICD-10-CM | POA: Insufficient documentation

## 2022-03-06 MED ORDER — FERROUS GLUCONATE 324 (38 FE) MG PO TABS: 324.0000 mg | ORAL_TABLET | Freq: Every day | ORAL | 3 refills | Status: DC

## 2022-03-07 ENCOUNTER — Other Ambulatory Visit: Payer: Self-pay

## 2022-03-07 DIAGNOSIS — K745 Biliary cirrhosis, unspecified: Secondary | ICD-10-CM

## 2022-03-08 DIAGNOSIS — R768 Other specified abnormal immunological findings in serum: Secondary | ICD-10-CM | POA: Insufficient documentation

## 2022-03-11 LAB — HEPATITIS B SURFACE ANTIBODY,QUALITATIVE: Hep B S Ab: NONREACTIVE

## 2022-03-11 LAB — ANTI-SMOOTH MUSCLE ANTIBODY, IGG: Actin (Smooth Muscle) Antibody (IGG): <20 U (ref <20)

## 2022-03-11 LAB — ANA: Anti Nuclear Antibody (ANA): POSITIVE — AB

## 2022-03-11 LAB — HEPATITIS B SURFACE ANTIGEN: Hepatitis B Surface Ag: NONREACTIVE

## 2022-03-11 LAB — ANTI-NUCLEAR AB-TITER (ANA TITER)
ANA TITER: 1:40 {titer} — ABNORMAL HIGH
ANA Titer 1: 1:80 {titer} — ABNORMAL HIGH

## 2022-03-11 LAB — HEPATITIS B CORE ANTIBODY, IGM: Hep B C IgM: NONREACTIVE

## 2022-03-11 LAB — MITOCHONDRIAL ANTIBODIES: Mitochondrial M2 Ab, IgG: <=20 U (ref <=20.0)

## 2022-03-11 LAB — HEPATITIS A ANTIBODY, TOTAL: Hepatitis A AB,Total: REACTIVE — AB

## 2022-03-11 LAB — HEPATITIS C ANTIBODY: Hepatitis C Ab: NONREACTIVE

## 2022-03-13 ENCOUNTER — Telehealth: Payer: Self-pay | Admitting: Internal Medicine

## 2022-03-13 LAB — PANCREATIC ELASTASE, FECAL: Pancreatic Elastase-1, Stool: >500 mcg/g

## 2022-03-13 NOTE — Telephone Encounter (Signed)
Reacieved call from [place] in regards to pt appts for 8/21, pt not sure why her appts are so far apart, advised Triage why appts was scheduled that way and to send in basket to we how we can see accomodations can be made.

## 2022-03-18 ENCOUNTER — Inpatient Hospital Stay: Payer: Medicare Other

## 2022-03-18 ENCOUNTER — Inpatient Hospital Stay: Payer: Medicare Other | Attending: Internal Medicine | Admitting: Internal Medicine

## 2022-03-18 ENCOUNTER — Encounter: Payer: Self-pay | Admitting: Internal Medicine

## 2022-03-18 DIAGNOSIS — D508 Other iron deficiency anemias: Secondary | ICD-10-CM | POA: Diagnosis not present

## 2022-03-18 DIAGNOSIS — K52831 Collagenous colitis: Secondary | ICD-10-CM | POA: Diagnosis not present

## 2022-03-18 DIAGNOSIS — D509 Iron deficiency anemia, unspecified: Secondary | ICD-10-CM | POA: Insufficient documentation

## 2022-03-18 NOTE — Progress Notes (Signed)
Patient here today for initial evaluation regarding anemia.  

## 2022-03-18 NOTE — Progress Notes (Addendum)
[facility]  [phone] [phone]  ID: CARLIN MAMONE OB: July 28, 1951  MR#: [id number]  WUX#:[id number]  Patient Care Team: Philmore Pali, NP as PCP - General (Nurse Practitioner)  REFERRING PHYSICIAN: Dr. Rush Landmark   REASON FOR REFERRAL: iron deficiency anemia   HPI: Emma Lewis is a 71 y.o. female with pmh of collagenous colitis follows with Dr. Dionicio Stall, HLD, hypothyroidism was referred to Hematology for management for IDA.  Patient reports history of diarrhea started in January 2023. She had colonoscopy and endoscopy with Dr. Rush Landmark on 12/26/2021 which showed erythematous mucosa in the stomach, grade 1 and grade 2 esophageal varices with no active bleeding was seen and multiple diverticuli.  H. pylori was negative.  Colon biopsies showed collagenous colitis.  She is following with GI for control of diarrhea and could not tolerate budesonide due to shortness of breath and swelling  She is on IBgard and has better control of her diarrhea.  She reports feeling fatigued.  She is intolerant to oral iron which causes diarrhea.  Patient denies fever, chills, nausea, vomiting, shortness of breath, cough, abdominal pain, bleeding, bladder issues.  On review of the labs, patient has a longstanding history of iron deficiency anemia.  Labs from 02/2022 were reviewed which showed ferritin of 20 and iron saturation of 7%.  Hemoglobin is 9.7.    REVIEW OF SYSTEMS:   ROS As per HPI. Otherwise, a complete review of systems is negative.  PAST MEDICAL HISTORY: Past Medical History:  Diagnosis Date   Allergies    Arthritis    "all over my body" (04/29/2018)   Chronic lower back pain    Fatty liver    Fever blister    Takes Acyclovir   GERD (gastroesophageal reflux disease)    Hyperlipemia    Hypothyroidism    Kidney stones    Migraines    Osteopenia    Pneumonia    "once; years ago" (04/29/2018)   PONV (postoperative nausea and vomiting)    "dry  heaves"   Pre-diabetes    Seasonal allergies    Vertigo     PAST SURGICAL HISTORY: Past Surgical History:  Procedure Laterality Date   ANTERIOR CERVICAL DECOMP/DISCECTOMY FUSION N/A 02/09/2014   Procedure: ANTERIOR CERVICAL DECOMPRESSION/DISCECTOMY FUSION 2 LEVELS;  Surgeon: [name], MD;  Location: [facility];  Service: Orthopedics;  Laterality: N/A;  C4-5, C5-6 Anterior Cervical Discectomy and Fusion, Allograft, Plate   BACK SURGERY     CARPAL TUNNEL RELEASE Right 2018   CATARACT EXTRACTION W/ INTRAOCULAR LENS  IMPLANT, BILATERAL     COLONOSCOPY     FIXATION KYPHOPLASTY LUMBAR SPINE  X 2   HIP ARTHROPLASTY Left 2011   INCISION AND DRAINAGE HIP Left 06/19/2018   Procedure: IRRIGATION AND DEBRIDEMENT LEFT HIP HEMATOMA ANTIBIOTIC BEADS, APPLICATION ON WOUND VAC;  Surgeon: [name], MD;  Location: [facility];  Service: Orthopedics;  Laterality: Left;   JOINT REPLACEMENT     KNEE ARTHROSCOPY Right    ORIF PERIPROSTHETIC FRACTURE Left 06/01/2018   Procedure: OPEN REDUCTION INTERNAL FIXATION (ORIF) PERIPROSTHETIC FEMUR FRACTURE;  Surgeon: [name], MD;  Location: [facility];  Service: Orthopedics;  Laterality: Left;   PATELLA FRACTURE SURGERY Right ~ 1990   "put metal in"   PATELLA HARDWARE REMOVAL Right    "took the metal out"   [place] Left 04/29/2018   ROBOTIC ASSISTED BILATERAL SALPINGO OOPHERECTOMY Bilateral 08/01/2015   Procedure: ROBOTIC ASSISTED BILATERAL SALPINGO OOPHORECTOMY;  Surgeon: Everitt Amber, MD;  Location: [facility];  Service: Gynecology;  Laterality: Bilateral;   SHOULDER OPEN ROTATOR CUFF REPAIR Right    3 TOTAL   SHOULDER SURGERY Right    TOTAL HIP ARTHROPLASTY Right 01/21/2018   Procedure: RIGHT TOTAL HIP ARTHROPLASTY DIRECT ANTERIOR;  Surgeon: [name], MD;  Location: [facility];  Service: Orthopedics;  Laterality: Right;   TOTAL HIP REVISION Left 06/10/2016   Procedure: TOTAL HIP REVISION ARTHROPLASTY;  Surgeon: Rod Can, MD;  Location: [facility];   Service: Orthopedics;  Laterality: Left;   TOTAL HIP REVISION Left 04/29/2018   Procedure: left total hip arthroplasty revision posterior approach;  Surgeon: [name], MD;  Location: [facility];  Service: Orthopedics;  Laterality: Left;   TOTAL HIP REVISION Left 09/21/2018   Procedure: left total hip arthroplasty femoral revision;  Surgeon: [name], MD;  Location: [facility];  Service: Orthopedics;  Laterality: Left;   WRIST GANGLION EXCISION Left 1970s    FAMILY HISTORY: Family History  Problem Relation Age of Onset   Lymphoma Mother    Arthritis Mother    Hyperlipidemia Mother    Colon polyps Father    Diabetes Father    Cirrhosis Father    Hyperlipidemia Father    Colon cancer Neg Hx    Stomach cancer Neg Hx    Esophageal cancer Neg Hx    Inflammatory bowel disease Neg Hx    Liver disease Neg Hx    Pancreatic cancer Neg Hx    Rectal cancer Neg Hx     ADVANCED DIRECTIVES (Y/N):  N  HEALTH MAINTENANCE: Social History   Tobacco Use   Smoking status: Never   Smokeless tobacco: Never  Vaping Use   Vaping Use: Never used  Substance Use Topics   Alcohol use: Not Currently    Comment: `   Drug use: Never     Colonoscopy:  PAP:  Bone density:  Lipid panel:  Allergies  Allergen Reactions   Entocort Ec [Budesonide] Shortness Of Breath and Swelling   Sulfa Antibiotics Other (See Comments)     Childhood allergy Mother said "I liked to have died" UNSPECIFIED "SEVERE CHILDHOOD REACTION"    Current Outpatient Medications  Medication Sig Dispense Refill   ciprofloxacin (CIPRO) 250 MG tablet Take 250 mg by mouth 2 (two) times daily.     acyclovir (ZOVIRAX) 400 MG tablet Take 1 tablet (400 mg total) by mouth 2 (two) times daily. (Patient taking differently: Take 400 mg by mouth See admin instructions. Take one tablet (400 mg) by mouth daily, may increase to one tablet (400 mg) twice daily as needed for fever blisters.) 60 tablet 0   AMBULATORY NON FORMULARY MEDICATION  Nutrafol Take 4 capsule by mouth daily     AMBULATORY NON FORMULARY MEDICATION Beet Root Take 2 capsule by mouth daily     AMBULATORY NON FORMULARY MEDICATION Liver and gallbladder support Take 1 capsule twice a day     atorvastatin (LIPITOR) 40 MG tablet Take 40 mg by mouth daily.     Calcium Carb-Cholecalciferol (CALCIUM 600 + D PO) Take 1 tablet by mouth 3 (three) times daily.      Cholecalciferol (VITAMIN D3) 2000 units TABS Take 2,000 Units by mouth 2 (two) times daily.      Coenzyme Q10 (CO Q10) 100 MG CAPS Take 1 capsule by mouth daily.     colestipol (COLESTID) 1 g tablet Take 1 tablet (1 g total) by mouth 2 (two) times daily  as needed (diarrhea- stop if causes constipation). (Patient not taking: Reported on 03/05/2022) 30 tablet 0   diphenoxylate-atropine (LOMOTIL) 2.5-0.025 MG tablet Take 1-3 tablets by mouth 4 (four) times daily as needed for diarrhea or loose stools. 30 tablet 0   ferrous gluconate (FERGON) 324 MG tablet Take 1 tablet (324 mg total) by mouth daily with breakfast. 30 tablet 3   ibuprofen (ADVIL) 200 MG tablet Take 800 mg by mouth 2 (two) times daily.     levothyroxine (EUTHYROX) 100 MCG tablet Take 100 mcg by mouth daily before breakfast.     Menaquinone-7 (VITAMIN K2) 100 MCG CAPS Take 1 capsule by mouth daily.     omeprazole (PRILOSEC OTC) 20 MG tablet Take 10 mg by mouth daily.     Peppermint Oil (IBGARD) 90 MG CPCR Take by mouth. Take 2 capsules by mouth in the mornings and 1-2 tablets at night     Turmeric Curcumin 500 MG CAPS Take 1 capsule by mouth 2 (two) times daily.     TYMLOS 3120 MCG/1.56ML SOPN Inject into the skin.     No current facility-administered medications for this visit.    OBJECTIVE: There were no vitals filed for this visit.   There is no height or weight on file to calculate BMI.    ECOG FS:2 - Symptomatic, <50% confined to bed  General: Well-developed, well-nourished, no acute distress. Eyes: Pink conjunctiva, anicteric sclera. HEENT:  Normocephalic, moist mucous membranes, clear oropharnyx. Lungs: Clear to auscultation bilaterally. Heart: Regular rate and rhythm. No rubs, murmurs, or gallops. Abdomen: Soft, nontender, nondistended. No organomegaly noted, normoactive bowel sounds. Musculoskeletal: No edema, cyanosis, or clubbing. Neuro: Alert, answering all questions appropriately. Cranial nerves grossly intact. Skin: No rashes or petechiae noted. Psych: Normal affect. Lymphatics: No cervical, calvicular, axillary or inguinal LAD.   LAB RESULTS:  Lab Results  Component Value Date   NA 140 03/05/2022   K 4.3 03/05/2022   CL 106 03/05/2022   CO2 26 03/05/2022   GLUCOSE 91 03/05/2022   BUN 14 03/05/2022   CREATININE 0.65 03/05/2022   CALCIUM 10.2 03/05/2022   PROT 7.1 03/05/2022   ALBUMIN 3.7 03/05/2022   AST 48 (H) 03/05/2022   ALT 24 03/05/2022   ALKPHOS 182 (H) 03/05/2022   BILITOT 0.6 03/05/2022   GFRNONAA >60 09/21/2018   GFRAA >60 09/21/2018    Lab Results  Component Value Date   WBC 7.1 03/05/2022   NEUTROABS 2.7 11/14/2021   HGB 9.7 (L) 03/05/2022   HCT 30.7 (L) 03/05/2022   MCV 80.4 03/05/2022   PLT 221.0 03/05/2022   Iron panel 03/06/2022   STUDIES: COLONOSCOPY 12/26/2021   ENDOSCOPY 12/26/2021   ASSESSMENT AND PLAN:   CELINES FEMIA is a 71 y.o. female with pmh of collagenous colitis, hypothyroidism and hyperlipidemia allergy clinic for further management of iron deficiency anemia  #Iron deficiency anemia -Labs reviewed. From 02/2022 ferritin of 20, saturation 7%.  Hemoglobin 9.7. b12 and folate normal.  -Intolerant to oral iron due to diarrhea -Discussed about treatment with IV Feraheme 550 mg weekly x2.  Discussed about low but potential risk of anaphylactic reaction.  Patient understands and agreeable to proceed. -She had GI work-up with colonoscopy and endoscopy as above.  Denies any bleeding. -Repeat iron panel and CBC in 3 months with follow-up MD visit.  # Chronic diarrhea #  collagenous colitis  - follows with GI. Reports could not tolerate budesonide due to sob and swelling. Currently on Ibgard which is moderately  controlling diarrhea.   Patient expressed understanding and was in agreement with this plan. She also understands that She can call clinic at any time with any questions, concerns, or complaints.   I provided 35 minutes (11:23 AM - 11:23 AM) of face-to-face time during this this encounter and > 50% was spent counseling as documented under my assessment and plan.     Dayanne Canary, MD   03/18/2022 11:23 AM

## 2022-03-22 DIAGNOSIS — N3946 Mixed incontinence: Secondary | ICD-10-CM | POA: Diagnosis not present

## 2022-03-22 LAB — CLOSTRIDIUM DIFFICILE BY PCR: Toxigenic C. Difficile by PCR: NEGATIVE

## 2022-03-22 LAB — FECAL FAT, QUALITATIVE
Fat Qual Neutral, Stl: NORMAL
Fat Qual Total, Stl: NORMAL

## 2022-03-27 ENCOUNTER — Inpatient Hospital Stay: Payer: Medicare Other

## 2022-03-27 VITALS — BP 118/58 | HR 77 | Temp 97.3°F | Resp 18

## 2022-03-27 DIAGNOSIS — D508 Other iron deficiency anemias: Secondary | ICD-10-CM

## 2022-03-27 DIAGNOSIS — K52831 Collagenous colitis: Secondary | ICD-10-CM | POA: Diagnosis not present

## 2022-03-27 MED ORDER — SODIUM CHLORIDE 0.9 % IV SOLN
510.0000 mg | Freq: Once | INTRAVENOUS | Status: AC
  Administered 2022-03-27: 510 mg via INTRAVENOUS
  Filled 2022-03-27: qty 510

## 2022-03-27 MED ORDER — SODIUM CHLORIDE 0.9 % IV SOLN
Freq: Once | INTRAVENOUS | Status: AC
  Filled 2022-03-27: qty 250

## 2022-03-27 NOTE — Patient Instructions (Signed)
[facility]  Discharge Instructions: Thank you for choosing [facility] to provide your oncology and hematology care.  If you have a lab appointment with the Cancer Center, please go directly to the Cancer Center and check in at the registration area.  Wear comfortable clothing and clothing appropriate for easy access to any Portacath or PICC line.   We strive to give you quality time with your provider. You may need to reschedule your appointment if you arrive late (15 or more minutes).  Arriving late affects you and other patients whose appointments are after yours.  Also, if you miss three or more appointments without notifying the office, you may be dismissed from the clinic at the provider's discretion.      For prescription refill requests, have your pharmacy contact our office and allow 72 hours for refills to be completed.    Today you received the following chemotherapy and/or immunotherapy agents FERAHEME      To help prevent nausea and vomiting after your treatment, we encourage you to take your nausea medication as directed.  BELOW ARE SYMPTOMS THAT SHOULD BE REPORTED IMMEDIATELY: *FEVER GREATER THAN 100.4 F (38 C) OR HIGHER *CHILLS OR SWEATING *NAUSEA AND VOMITING THAT IS NOT CONTROLLED WITH YOUR NAUSEA MEDICATION *UNUSUAL SHORTNESS OF BREATH *UNUSUAL BRUISING OR BLEEDING *URINARY PROBLEMS (pain or burning when urinating, or frequent urination) *BOWEL PROBLEMS (unusual diarrhea, constipation, pain near the anus) TENDERNESS IN MOUTH AND THROAT WITH OR WITHOUT PRESENCE OF ULCERS (sore throat, sores in mouth, or a toothache) UNUSUAL RASH, SWELLING OR PAIN  UNUSUAL VAGINAL DISCHARGE OR ITCHING   Items with * indicate a potential emergency and should be followed up as soon as possible or go to the Emergency Department if any problems should occur.  Please show the CHEMOTHERAPY ALERT CARD or IMMUNOTHERAPY ALERT CARD at check-in to  the Emergency Department and triage nurse.  Should you have questions after your visit or need to cancel or reschedule your appointment, please contact [facility]  [phone] and follow the prompts.  Office hours are 8:00 a.m. to 4:30 p.m. Monday - Friday. Please note that voicemails left after 4:00 p.m. may not be returned until the following business day.  We are closed weekends and major holidays. You have access to a nurse at all times for urgent questions. Please call the main number to the clinic [phone] and follow the prompts.  For any non-urgent questions, you may also contact your provider using MyChart. We now offer e-Visits for anyone 18 and older to request care online for non-urgent symptoms. For details visit [email or web address].   Also download the MyChart app! Go to the app store, search "MyChart", open the app, select [facility], and log in with your MyChart username and password.  Masks are optional in the cancer centers. If you would like for your care team to wear a mask while they are taking care of you, please let them know. For doctor visits, patients may have with them one support person who is at least 73 years old. At this time, visitors are not allowed in the infusion area.  Ferumoxytol Injection What is this medication? FERUMOXYTOL (FER ue MOX i tol) treats low levels of iron in your body (iron deficiency anemia). Iron is a mineral that plays an important role in making red blood cells, which carry oxygen from your lungs to the rest of your body. This medicine may be used for other   purposes; ask your health care provider or pharmacist if you have questions. COMMON BRAND NAME(S): Feraheme What should I tell my care team before I take this medication? They need to know if you have any of these conditions: Anemia not caused by low iron levels High levels of iron in the blood Magnetic resonance imaging (MRI) test scheduled An  unusual or allergic reaction to iron, other medications, foods, dyes, or preservatives Pregnant or trying to get pregnant Breast-feeding How should I use this medication? This medication is for injection into a vein. It is given in a hospital or clinic setting. Talk to your care team the use of this medication in children. Special care may be needed. Overdosage: If you think you have taken too much of this medicine contact a poison control center or emergency room at once. NOTE: This medicine is only for you. Do not share this medicine with others. What if I miss a dose? It is important not to miss your dose. Call your care team if you are unable to keep an appointment. What may interact with this medication? Other iron products This list may not describe all possible interactions. Give your health care provider a list of all the medicines, herbs, non-prescription drugs, or dietary supplements you use. Also tell them if you smoke, drink alcohol, or use illegal drugs. Some items may interact with your medicine. What should I watch for while using this medication? Visit your care team regularly. Tell your care team if your symptoms do not start to get better or if they get worse. You may need blood work done while you are taking this medication. You may need to follow a special diet. Talk to your care team. Foods that contain iron include: whole grains/cereals, dried fruits, beans, or peas, leafy green vegetables, and organ meats (liver, kidney). What side effects may I notice from receiving this medication? Side effects that you should report to your care team as soon as possible: Allergic reactions--skin rash, itching, hives, swelling of the face, lips, tongue, or throat Low blood pressure--dizziness, feeling faint or lightheaded, blurry vision Shortness of breath Side effects that usually do not require medical attention (report to your care team if they continue or are  bothersome): Flushing Headache Joint pain Muscle pain Nausea Pain, redness, or irritation at injection site This list may not describe all possible side effects. Call your doctor for medical advice about side effects. You may report side effects to FDA at 1-800-FDA-1088. Where should I keep my medication? This medication is given in a hospital or clinic and will not be stored at home. NOTE: This sheet is a summary. It may not cover all possible information. If you have questions about this medicine, talk to your doctor, pharmacist, or health care provider.  2023 Elsevier/Gold Standard (2020-12-08 00:00:00)    

## 2022-04-03 ENCOUNTER — Inpatient Hospital Stay: Payer: Medicare Other | Attending: Internal Medicine

## 2022-04-03 VITALS — BP 125/68 | HR 75 | Temp 98.7°F | Resp 18

## 2022-04-03 DIAGNOSIS — D508 Other iron deficiency anemias: Secondary | ICD-10-CM | POA: Insufficient documentation

## 2022-04-03 DIAGNOSIS — K52831 Collagenous colitis: Secondary | ICD-10-CM | POA: Insufficient documentation

## 2022-04-03 MED ORDER — SODIUM CHLORIDE 0.9 % IV SOLN
510.0000 mg | Freq: Once | INTRAVENOUS | Status: AC
  Administered 2022-04-03: 510 mg via INTRAVENOUS
  Filled 2022-04-03: qty 510

## 2022-04-03 MED ORDER — SODIUM CHLORIDE 0.9% FLUSH
10.0000 mL | Freq: Once | INTRAVENOUS | Status: AC | PRN
  Administered 2022-04-03: 10 mL
  Filled 2022-04-03: qty 10

## 2022-04-03 MED ORDER — SODIUM CHLORIDE 0.9 % IV SOLN
Freq: Once | INTRAVENOUS | Status: AC
  Filled 2022-04-03: qty 250

## 2022-04-03 NOTE — Patient Instructions (Signed)

## 2022-04-12 DIAGNOSIS — N3946 Mixed incontinence: Secondary | ICD-10-CM | POA: Diagnosis not present

## 2022-04-12 DIAGNOSIS — N3000 Acute cystitis without hematuria: Secondary | ICD-10-CM | POA: Diagnosis not present

## 2022-04-17 ENCOUNTER — Telehealth: Payer: Self-pay | Admitting: Gastroenterology

## 2022-04-17 DIAGNOSIS — I8510 Secondary esophageal varices without bleeding: Secondary | ICD-10-CM

## 2022-04-17 DIAGNOSIS — Z23 Encounter for immunization: Secondary | ICD-10-CM

## 2022-04-17 DIAGNOSIS — K7460 Unspecified cirrhosis of liver: Secondary | ICD-10-CM

## 2022-04-17 NOTE — Telephone Encounter (Signed)
Patient called in requesting a HEP B injection . Please advise

## 2022-04-18 NOTE — Telephone Encounter (Signed)
Patient aware that she is scheduled for Hep B injection on 04-19-22 at 11am per patient request.

## 2022-04-19 ENCOUNTER — Ambulatory Visit (INDEPENDENT_AMBULATORY_CARE_PROVIDER_SITE_OTHER): Payer: Medicare Other | Admitting: Gastroenterology

## 2022-04-19 DIAGNOSIS — Z23 Encounter for immunization: Secondary | ICD-10-CM

## 2022-05-07 ENCOUNTER — Encounter: Payer: Self-pay | Admitting: Gastroenterology

## 2022-05-07 ENCOUNTER — Ambulatory Visit (INDEPENDENT_AMBULATORY_CARE_PROVIDER_SITE_OTHER): Payer: Medicare Other | Admitting: Gastroenterology

## 2022-05-07 VITALS — BP 120/74 | HR 80 | Ht 62.5 in | Wt 164.5 lb

## 2022-05-07 DIAGNOSIS — K580 Irritable bowel syndrome with diarrhea: Secondary | ICD-10-CM | POA: Diagnosis not present

## 2022-05-07 DIAGNOSIS — K745 Biliary cirrhosis, unspecified: Secondary | ICD-10-CM | POA: Diagnosis not present

## 2022-05-07 DIAGNOSIS — K529 Noninfective gastroenteritis and colitis, unspecified: Secondary | ICD-10-CM

## 2022-05-07 DIAGNOSIS — K52832 Lymphocytic colitis: Secondary | ICD-10-CM

## 2022-05-07 DIAGNOSIS — Z8639 Personal history of other endocrine, nutritional and metabolic disease: Secondary | ICD-10-CM | POA: Diagnosis not present

## 2022-05-07 DIAGNOSIS — I8510 Secondary esophageal varices without bleeding: Secondary | ICD-10-CM | POA: Diagnosis not present

## 2022-05-07 NOTE — Patient Instructions (Addendum)
If you decide that you would like to see Sports Medicine- Dr. Lois Huxley   _______________________________________________________  If you are age 73 or older, your body mass index should be between 23-30. Your Body mass index is 29.61 kg/m. If this is out of the aforementioned range listed, please consider follow up with your Primary Care Provider.  If you are age 64 or younger, your body mass index should be between 19-25. Your Body mass index is 29.61 kg/m. If this is out of the aformentioned range listed, please consider follow up with your Primary Care Provider.   ________________________________________________________  The [facility] providers would like to encourage you to use [facility] to communicate with providers for non-urgent requests or questions.  Due to long hold times on the telephone, sending your provider a message by [facility] may be a faster and more efficient way to get a response.  Please allow 48 business hours for a response.  Please remember that this is for non-urgent requests.  _______________________________________________________  Follow-up in 3 months. Office to contact at a later time.   Thank you for choosing me and [facility].  Dr. Rush Landmark

## 2022-05-07 NOTE — Progress Notes (Signed)
[facility] VISIT   Primary Care Provider Philmore Pali, NP (Inactive) [place] [address] [phone]  Patient Profile: Emma Lewis is a 71 y.o. female with a pmh significant for hyperlipidemia, hypothyroidism, osteopenia, migraines, nephrolithiasis, cirrhosis (MALD most likely with portal hypertension and findings of EVs), status post cholecystectomy, chronic diarrhea (IBS + biopsy-proven collagenous colitis +/- bile salt diarrhea), GERD.  The patient presents to the [facility] for an evaluation and management of problem(s) noted below:  Problem List 1. Irritable bowel syndrome with diarrhea   2. Lymphocytic colitis   3. Chronic diarrhea   4. History of iron deficiency   5. Biliary cirrhosis ([place])   6. Secondary esophageal varices without bleeding (HCC)    History of Present Illness Please see prior notes for full details of HPI.  Interval History  The patient returns for follow-up.  Overall, the patient has been doing well.  She is not experiencing any new if there are new issues from a GI perspective.  She is experiencing more issues with her joints and her knee at this time.  The patient denies any issues with jaundice, scleral icterus, generalized pruritus, darkened/amber urine, clay-colored stools, LE edema, hematemesis, coffee-ground emesis, abdominal distention, confusion.  In regards to her diarrheal symptoms, as long as she uses IBgard on an as-needed basis she is having a significant improvement in her bowel symptoms.  She has not had to use any antimotility agents.  She has not had to use any bismuth/Pepto-Bismol therapy.  She still has some concerns in regards to her blood pressure and ability to tolerate further blood pressure medication adjustment.  She is currently scheduled in a few weeks time for her next hepatitis vaccine series injection.  GI Review of Systems Positive as above Negative for odor  aphasia, dysphagia, nausea, vomiting, pain, alteration of bowel habits, melena, hematochezia   Review of Systems General: Denies fevers/chills/weight loss unintentionally Cardiovascular: Denies chest pain Pulmonary: Denies shortness of breath Gastroenterological: See HPI Genitourinary: Denies darkened urine Hematological: Denies easy bruising/bleeding Dermatological: Denies jaundice Psychological: Mood is stable   Medications Current Outpatient Medications  Medication Sig Dispense Refill   acyclovir (ZOVIRAX) 400 MG tablet Take 1 tablet (400 mg total) by mouth 2 (two) times daily. 60 tablet 0   AMBULATORY NON FORMULARY MEDICATION Nutrafol Take 4 capsule by mouth daily     AMBULATORY NON FORMULARY MEDICATION Beet Root Take 2 capsule by mouth daily     AMBULATORY NON FORMULARY MEDICATION Aspercreme Patch 1 patch topical daily     atorvastatin (LIPITOR) 40 MG tablet Take 40 mg by mouth daily.     B-D UF III MINI PEN NEEDLES 31G X 5 MM MISC Inject into the skin as directed.     Calcium Carb-Cholecalciferol (CALCIUM 600 + D PO) Take 1 tablet by mouth 3 (three) times daily.      Cholecalciferol (VITAMIN D3) 2000 units TABS Take 2,000 Units by mouth 2 (two) times daily.      Coenzyme Q10 (CO Q10) 100 MG CAPS Take 1 capsule by mouth daily.     Cyanocobalamin (VITAMIN B-12) 2500 MCG SUBL Place 1 tablet under the tongue daily.     ibuprofen (ADVIL) 200 MG tablet Take 800 mg by mouth 2 (two) times daily.     levothyroxine (EUTHYROX) 100 MCG tablet Take 100 mcg by mouth daily before breakfast.     Menaquinone-7 (VITAMIN K2) 100 MCG CAPS Take 1 capsule by mouth daily.  omeprazole (PRILOSEC OTC) 20 MG tablet Take 10 mg by mouth daily.     Peppermint Oil (IBGARD) 90 MG CPCR Take by mouth. Take 2 capsules by mouth in the mornings and 1-2 tablets at night     Turmeric Curcumin 500 MG CAPS Take 1 capsule by mouth 2 (two) times daily.     TYMLOS 3120 MCG/1.56ML SOPN Inject into the skin.     No  current facility-administered medications for this visit.    Allergies Allergies  Allergen Reactions   Entocort Ec [Budesonide] Shortness Of Breath and Swelling   Sulfa Antibiotics Other (See Comments)     Childhood allergy Mother said "I liked to have died" UNSPECIFIED "SEVERE CHILDHOOD REACTION"    Histories Past Medical History:  Diagnosis Date   Allergies    Arthritis    "all over my body" (04/29/2018)   Chronic lower back pain    Fatty liver    Fever blister    Takes Acyclovir   GERD (gastroesophageal reflux disease)    Hyperlipemia    Hypothyroidism    Kidney stones    Migraines    Osteopenia    Pneumonia    "once; years ago" (04/29/2018)   PONV (postoperative nausea and vomiting)    "dry heaves"   Pre-diabetes    Seasonal allergies    Vertigo    Past Surgical History:  Procedure Laterality Date   ANTERIOR CERVICAL DECOMP/DISCECTOMY FUSION N/A 02/09/2014   Procedure: ANTERIOR CERVICAL DECOMPRESSION/DISCECTOMY FUSION 2 LEVELS;  Surgeon: [name], MD;  Location: [place];  Service: Orthopedics;  Laterality: N/A;  C4-5, C5-6 Anterior Cervical Discectomy and Fusion, Allograft, Plate   BACK SURGERY     CARPAL TUNNEL RELEASE Right 2018   CATARACT EXTRACTION W/ INTRAOCULAR LENS  IMPLANT, BILATERAL     COLONOSCOPY     FIXATION KYPHOPLASTY LUMBAR SPINE  X 2   HIP ARTHROPLASTY Left 2011   INCISION AND DRAINAGE HIP Left 06/19/2018   Procedure: IRRIGATION AND DEBRIDEMENT LEFT HIP HEMATOMA ANTIBIOTIC BEADS, APPLICATION ON WOUND VAC;  Surgeon: [name], MD;  Location: [place];  Service: Orthopedics;  Laterality: Left;   JOINT REPLACEMENT     KNEE ARTHROSCOPY Right    ORIF PERIPROSTHETIC FRACTURE Left 06/01/2018   Procedure: OPEN REDUCTION INTERNAL FIXATION (ORIF) PERIPROSTHETIC FEMUR FRACTURE;  Surgeon: [name], MD;  Location: [place];  Service: Orthopedics;  Laterality: Left;   PATELLA FRACTURE SURGERY Right ~ 1990   "put metal in"   PATELLA HARDWARE REMOVAL Right     "took the metal out"   [place] Left 04/29/2018   ROBOTIC ASSISTED BILATERAL SALPINGO OOPHERECTOMY Bilateral 08/01/2015   Procedure: ROBOTIC ASSISTED BILATERAL SALPINGO OOPHORECTOMY;  Surgeon: Everitt Amber, MD;  Location: [facility];  Service: Gynecology;  Laterality: Bilateral;   SHOULDER OPEN ROTATOR CUFF REPAIR Right    3 TOTAL   SHOULDER SURGERY Right    TOTAL HIP ARTHROPLASTY Right 01/21/2018   Procedure: RIGHT TOTAL HIP ARTHROPLASTY DIRECT ANTERIOR;  Surgeon: [name], MD;  Location: [place];  Service: Orthopedics;  Laterality: Right;   TOTAL HIP REVISION Left 06/10/2016   Procedure: TOTAL HIP REVISION ARTHROPLASTY;  Surgeon: Rod Can, MD;  Location: [place];  Service: Orthopedics;  Laterality: Left;   TOTAL HIP REVISION Left 04/29/2018   Procedure: left total hip arthroplasty revision posterior approach;  Surgeon: [name], MD;  Location: [place];  Service: Orthopedics;  Laterality: Left;   TOTAL HIP REVISION Left 09/21/2018  Procedure: left total hip arthroplasty femoral revision;  Surgeon: [name], MD;  Location: [place];  Service: Orthopedics;  Laterality: Left;   WRIST GANGLION EXCISION Left 1970s   Social History   Socioeconomic History   Marital status: Married    Spouse name: Not on file   Number of children: 1   Years of education: Not on file   Highest education level: Not on file  Occupational History   Not on file  Tobacco Use   Smoking status: Never   Smokeless tobacco: Never  Vaping Use   Vaping Use: Never used  Substance and Sexual Activity   Alcohol use: Not Currently    Comment: `   Drug use: Never   Sexual activity: Not Currently  Other Topics Concern   Not on file  Social History Narrative   Not on file   Social Determinants of Health   Financial Resource Strain: Not on file  Food Insecurity: Not on file  Transportation Needs: Not on file  Physical Activity: Not on file  Stress: Not on file  Social Connections:  Not on file  Intimate Partner Violence: Not on file   Family History  Problem Relation Age of Onset   Lymphoma Mother    Arthritis Mother    Hyperlipidemia Mother    Colon polyps Father    Diabetes Father    Cirrhosis Father    Hyperlipidemia Father    Colon cancer Neg Hx    Stomach cancer Neg Hx    Esophageal cancer Neg Hx    Inflammatory bowel disease Neg Hx    Liver disease Neg Hx    Pancreatic cancer Neg Hx    Rectal cancer Neg Hx    I have reviewed her medical, social, and family history in detail and updated the electronic medical record as necessary.    PHYSICAL EXAMINATION  BP 120/74 (BP Location: Left Arm, Patient Position: Sitting, Cuff Size: Normal)   Pulse 80   Ht 5' 2.5" (1.588 m)   Wt 164 lb 8 oz (74.6 kg)   BMI 29.61 kg/m  Wt Readings from Last 3 Encounters:  05/07/22 164 lb 8 oz (74.6 kg)  03/18/22 164 lb 6.4 oz (74.6 kg)  03/05/22 167 lb 4 oz (75.9 kg)  GEN: NAD, appears stated age, doesn't appear chronically ill PSYCH: Cooperative, without pressured speech EYE: Conjunctivae pink, sclerae anicteric ENT: MMM CV: Nontachycardic RESP: No audible wheezing GI: NABS, soft, NT, mildly protuberant, without rebound or guarding MSK/EXT: Trace bilateral pedal edema present SKIN: No jaundice NEURO:  Alert & Oriented x 3, no focal deficits, no evidence of asterixis   REVIEW OF DATA  I reviewed the following data at the time of this encounter:  GI Procedures and Studies  Previously reviewed  Laboratory Studies  Reviewed those in epic  Imaging Studies  No new imaging studies to review   ASSESSMENT  Ms. Kizziah is a 71 y.o. female with a pmh significant for hyperlipidemia, hypothyroidism, osteopenia, migraines, nephrolithiasis, cirrhosis (MALD most likely with portal hypertension and findings of EVs), status post cholecystectomy, chronic diarrhea (IBS + biopsy-proven collagenous colitis +/- bile salt diarrhea), GERD.  The patient is seen today for evaluation  and management of:  1. Irritable bowel syndrome with diarrhea   2. Lymphocytic colitis   3. Chronic diarrhea   4. History of iron deficiency   5. Biliary cirrhosis ([place])   6. Secondary esophageal varices without bleeding [facility])    The patient is hemodynamically and  clinically stable.  From a chronic liver disease standpoint, she would benefit from beta-blockade due to her varices.  Lets see how her follow-up blood pressure looks at the time of her follow-up vaccine in a few weeks and if she remains normotensive with a good heart rate then we should strongly consider nadolol therapy or propranolol therapy or carvedilol therapy.  If she does not want to have any medication adjustments then we need to repeat her upper endoscopy at a 1 year interval from her last EUS.  We need to decrease her risk of bleeding is much as possible.  She will be due for [facility] screening in November and an ultrasound is recommended.  From a chronic diarrhea standpoint, she seems to be doing well with IBgard so hopefully her lymphocytic colitis does not act up in the future as she is not a candidate that is good for budesonide therapy with her prior issues but we could use bismuth.  Keeping in mind the possibility of bile salt diarrhea though not clear if she got great effect previously from it.  She remains at risk of SIBO as well and so we will need to consider that to if she has progressive issues but also with her prior C. difficile infection she may need repeat testing in the future if issues redevelop.  Hopefully that is not the case.  All patient questions were answered to the best of my ability, and the patient agrees to the aforementioned plan of action with follow-up as indicated.   PLAN  #ESLD Management Volume -No diuretic therapy currently needed still -Obtain weekly standing weight (update if increasing by 2 to 3 pounds in a week) - 1500 to 2000 mg Na diet Infection -No evidence of SBP so no antibiotics  required Bleeding -Grade 1/2 EV's in 5/23 -Offer beta-blockade but patient hesitant and will repeat blood pressure at follow-up visit and consider NS BB/carvedilol therapy initiation -If patient does not tolerate beta-blockade in future or does not want to be on it then repeat EGD in 11/2022 and consider banding protocol Encephalopathy -None Screening -UTD next due in 11/23 Transplant -Low MELD transplant evaluation not necessary at this time Vaccination -Will complete immunization series -Recommend PCP discuss influenza, Pneumococcal vaccines Other -Promote intake of 1.5 g/kg/day of Protein (Ensures/Boosts at each meal)  Laboratories as outlined below to be drawn at time of her follow-up immunization visit Continue IBgard (some further samples given to patient) Can consider bismuth therapy in future Can consider Colestid/cholestyramine again in future We will remember her prior history of C. difficile in case things progressed dramatically in future  Pending patient's iron laboratories and blood counts with hematology, if evidence of iron deficiency persists, will need to consider video capsule endoscopy    No orders of the defined types were placed in this encounter.   New Prescriptions   No medications on file   Modified Medications   No medications on file    Planned Follow Up No follow-ups on file.   Total Time in Face-to-Face and in Coordination of Care for patient including independent/personal interpretation/review of prior testing, medical history, examination, medication adjustment, communicating results with the patient directly, and documentation within the EHR is 30 minutes.   Justice Britain, MD [facility] # [phone]

## 2022-05-21 ENCOUNTER — Encounter: Payer: Self-pay | Admitting: Gastroenterology

## 2022-05-21 ENCOUNTER — Ambulatory Visit (INDEPENDENT_AMBULATORY_CARE_PROVIDER_SITE_OTHER): Payer: Medicare Other | Admitting: Gastroenterology

## 2022-05-21 VITALS — BP 160/78 | HR 78

## 2022-05-21 DIAGNOSIS — K745 Biliary cirrhosis, unspecified: Secondary | ICD-10-CM | POA: Diagnosis not present

## 2022-05-21 DIAGNOSIS — Z23 Encounter for immunization: Secondary | ICD-10-CM

## 2022-05-21 NOTE — Progress Notes (Signed)
I have reviewed the patient's last few sets of vital signs. She meets criteria to initiate nonselective beta-blocker in effort of trying to decrease her risk of variceal hemorrhage in the future. I recommend that we initiate her on nadolol 20 mg daily.  Monitor her by for starting at half of the tablet daily x1 week and then going to full tablet thereafter. She can come in for a blood pressure check in the next 2 to 4 weeks and we will see where things stand. If patient remains hesitant about beta-blocker then we can hold off but certainly she has a risk of bleeding in the setting of her known varices and so we want to try to prevent that is much as possible.  Patty, Please reach out to patient and offer her initiation of nadolol.   Justice Britain, MD [facility] # [phone]

## 2022-05-21 NOTE — Patient Instructions (Signed)
The pt arrived for Hep B #2  Pt tolerated injection without complaints.   BP/HR completed and entered

## 2022-05-21 NOTE — Progress Notes (Signed)
Thank you for letting me know. I will send you a separate note in which will be our plan of action. Thanks. GM

## 2022-05-22 ENCOUNTER — Other Ambulatory Visit: Payer: Self-pay

## 2022-05-22 MED ORDER — NADOLOL 20 MG PO TABS: ORAL_TABLET | ORAL | 6 refills | Status: AC

## 2022-05-22 NOTE — Progress Notes (Signed)
The pt has been advised and agrees to Nadolol -prescription sent The pt wants to call back to make appt for BP/HR check

## 2022-05-22 NOTE — Progress Notes (Unsigned)
[place] Phone: [phone] Subjective:   Emma Lewis, am serving as a scribe for Dr. Hulan Saas.  I'm seeing this patient by the request  of:  Philmore Pali, NP (Inactive)  CC: Left hip pain  [id number]  Emma Lewis is a 71 y.o. female coming in with complaint of L hip pain. Hx of R THR on 2019. L THR 2011. Revision L TH 2017, 2019, 2020. Hx L2 compression fx. Patient states that her hip pain seems to be worsening. Has metal rod inside of the bone after a distal femur fx. Here today for distal femur and knee pain. Patient uses a cane as knee is unstable. Was told that rod has worked its way out of the bone and that she needs another surgery.   Has history of 3 surgeries in R shoulder as well.     Past Medical History:  Diagnosis Date   Allergies    Arthritis    "all over my body" (04/29/2018)   Chronic lower back pain    Fatty liver    Fever blister    Takes Acyclovir   GERD (gastroesophageal reflux disease)    Hyperlipemia    Hypothyroidism    Kidney stones    Migraines    Osteopenia    Pneumonia    "once; years ago" (04/29/2018)   PONV (postoperative nausea and vomiting)    "dry heaves"   Pre-diabetes    Seasonal allergies    Vertigo    Past Surgical History:  Procedure Laterality Date   ANTERIOR CERVICAL DECOMP/DISCECTOMY FUSION N/A 02/09/2014   Procedure: ANTERIOR CERVICAL DECOMPRESSION/DISCECTOMY FUSION 2 LEVELS;  Surgeon: [name], MD;  Location: [place];  Service: Orthopedics;  Laterality: N/A;  C4-5, C5-6 Anterior Cervical Discectomy and Fusion, Allograft, Plate   BACK SURGERY     CARPAL TUNNEL RELEASE Right 2018   CATARACT EXTRACTION W/ INTRAOCULAR LENS  IMPLANT, BILATERAL     COLONOSCOPY     FIXATION KYPHOPLASTY LUMBAR SPINE  X 2   HIP ARTHROPLASTY Left 2011   INCISION AND DRAINAGE HIP Left 06/19/2018   Procedure: IRRIGATION AND DEBRIDEMENT LEFT HIP HEMATOMA ANTIBIOTIC BEADS,  APPLICATION ON WOUND VAC;  Surgeon: [name], MD;  Location: [facility];  Service: Orthopedics;  Laterality: Left;   JOINT REPLACEMENT     KNEE ARTHROSCOPY Right    ORIF PERIPROSTHETIC FRACTURE Left 06/01/2018   Procedure: OPEN REDUCTION INTERNAL FIXATION (ORIF) PERIPROSTHETIC FEMUR FRACTURE;  Surgeon: [name], MD;  Location: [place];  Service: Orthopedics;  Laterality: Left;   PATELLA FRACTURE SURGERY Right ~ 1990   "put metal in"   PATELLA HARDWARE REMOVAL Right    "took the metal out"   [place] Left 04/29/2018   ROBOTIC ASSISTED BILATERAL SALPINGO OOPHERECTOMY Bilateral 08/01/2015   Procedure: ROBOTIC ASSISTED BILATERAL SALPINGO OOPHORECTOMY;  Surgeon: Everitt Amber, MD;  [facility];  Service: Gynecology;  Laterality: Bilateral;   SHOULDER OPEN ROTATOR CUFF REPAIR Right    3 TOTAL   SHOULDER SURGERY Right    TOTAL HIP ARTHROPLASTY Right 01/21/2018   Procedure: RIGHT TOTAL HIP ARTHROPLASTY DIRECT ANTERIOR;  Surgeon: [name], MD;  Location: [place];  Service: Orthopedics;  Laterality: Right;   TOTAL HIP REVISION Left 06/10/2016   Procedure: TOTAL HIP REVISION ARTHROPLASTY;  Surgeon: Rod Can, MD;  Location: [place];  Service: Orthopedics;  Laterality: Left;   TOTAL HIP REVISION  Left 04/29/2018   Procedure: left total hip arthroplasty revision posterior approach;  Surgeon: [name], MD;  Location: [place];  Service: Orthopedics;  Laterality: Left;   TOTAL HIP REVISION Left 09/21/2018   Procedure: left total hip arthroplasty femoral revision;  Surgeon: [name], MD;  Location: [place];  Service: Orthopedics;  Laterality: Left;   WRIST GANGLION EXCISION Left 1970s   Social History   Socioeconomic History   Marital status: Married    Spouse name: Not on file   Number of children: 1   Years of education: Not on file   Highest education level: Not on file  Occupational History   Not on file  Tobacco Use   Smoking status: Never   Smokeless  tobacco: Never  Vaping Use   Vaping Use: Never used  Substance and Sexual Activity   Alcohol use: Not Currently    Comment: `   Drug use: Never   Sexual activity: Not Currently  Other Topics Concern   Not on file  Social History Narrative   Not on file   Social Determinants of Health   Financial Resource Strain: Not on file  Food Insecurity: Not on file  Transportation Needs: Not on file  Physical Activity: Not on file  Stress: Not on file  Social Connections: Not on file   Allergies  Allergen Reactions   Entocort Ec [Budesonide] Shortness Of Breath and Swelling   Sulfa Antibiotics Other (See Comments)     Childhood allergy Mother said "I liked to have died" UNSPECIFIED "SEVERE CHILDHOOD REACTION"   Family History  Problem Relation Age of Onset   Lymphoma Mother    Arthritis Mother    Hyperlipidemia Mother    Colon polyps Father    Diabetes Father    Cirrhosis Father    Hyperlipidemia Father    Colon cancer Neg Hx    Stomach cancer Neg Hx    Esophageal cancer Neg Hx    Inflammatory bowel disease Neg Hx    Liver disease Neg Hx    Pancreatic cancer Neg Hx    Rectal cancer Neg Hx     Current Outpatient Medications (Endocrine & Metabolic):    levothyroxine (EUTHYROX) 100 MCG tablet, Take 100 mcg by mouth daily before breakfast.   TYMLOS 3120 MCG/1.56ML SOPN, Inject into the skin.  Current Outpatient Medications (Cardiovascular):    atorvastatin (LIPITOR) 40 MG tablet, Take 40 mg by mouth daily.   nadolol (CORGARD) 20 MG tablet, Take 1/2 tab (10 mg) for 1 week then increase to 1 tab (20 mg) daily   Current Outpatient Medications (Analgesics):    ibuprofen (ADVIL) 200 MG tablet, Take 800 mg by mouth 2 (two) times daily.  Current Outpatient Medications (Hematological):    Cyanocobalamin (VITAMIN B-12) 2500 MCG SUBL, Place 1 tablet under the tongue daily.  Current Outpatient Medications (Other):    acyclovir (ZOVIRAX) 400 MG tablet, Take 1 tablet (400 mg total)  by mouth 2 (two) times daily.   AMBULATORY NON FORMULARY MEDICATION, Nutrafol Take 4 capsule by mouth daily   AMBULATORY NON FORMULARY MEDICATION, Beet Root Take 2 capsule by mouth daily   AMBULATORY NON FORMULARY MEDICATION, Aspercreme Patch 1 patch topical daily   B-D UF III MINI PEN NEEDLES 31G X 5 MM MISC, Inject into the skin as directed.   Calcium Carb-Cholecalciferol (CALCIUM 600 + D PO), Take 1 tablet by mouth 3 (three) times daily.    Cholecalciferol (VITAMIN D3) 2000 units TABS, Take 2,000 Units  by mouth 2 (two) times daily.    Coenzyme Q10 (CO Q10) 100 MG CAPS, Take 1 capsule by mouth daily.   Menaquinone-7 (VITAMIN K2) 100 MCG CAPS, Take 1 capsule by mouth daily.   omeprazole (PRILOSEC OTC) 20 MG tablet, Take 10 mg by mouth daily.   Peppermint Oil (IBGARD) 90 MG CPCR, Take by mouth. Take 2 capsules by mouth in the mornings and 1-2 tablets at night   Turmeric Curcumin 500 MG CAPS, Take 1 capsule by mouth 2 (two) times daily.   Reviewed prior external information including notes and imaging from  primary care provider As well as notes that were available from care everywhere and other healthcare systems.  Past medical history, social, surgical and family history all reviewed in electronic medical record.  Lewis pertanent information unless stated regarding to the chief complaint.   Review of Systems:  Lewis headache, visual changes, nausea, vomiting, diarrhea, constipation, dizziness, abdominal pain, skin rash, fevers, chills, night sweats, weight loss, swollen lymph nodes, joint swelling, chest pain, shortness of breath, mood changes. POSITIVE muscle aches, body aches  Objective  Height '5\' 2"'$  (1.575 m), weight 164 lb (74.4 kg).   General: Lewis apparent distress alert and oriented x3 mood and affect normal, dressed appropriately.  HEENT: Pupils equal, extraocular movements intact  Respiratory: Patient's speak in full sentences and does not appear short of breath  Cardiovascular: Lewis  lower extremity edema, non tender, Lewis erythema  Antalgic gait noted.  Patient still has tenderness over the femur itself.  Patient does have some instability and feels of the hip.  Does have arthritic changes of the left knee noted as well.  Instability with valgus and varus force, trace effusion noted. Mild positive fulcrum test of the leg.  Tender to palpation diffusely around the knee as well.  Neurovascularly intact distally.  4 out of 5 dorsiflexion strength but not much stronger on the contralateral side   Impression and Recommendations:     The above documentation has been reviewed and is accurate and complete Lyndal Pulley, DO

## 2022-05-23 ENCOUNTER — Ambulatory Visit (INDEPENDENT_AMBULATORY_CARE_PROVIDER_SITE_OTHER): Payer: Medicare Other | Admitting: Family Medicine

## 2022-05-23 VITALS — Ht 62.0 in | Wt 164.0 lb

## 2022-05-23 DIAGNOSIS — M79605 Pain in left leg: Secondary | ICD-10-CM | POA: Diagnosis not present

## 2022-05-23 DIAGNOSIS — T84021D Dislocation of internal left hip prosthesis, subsequent encounter: Secondary | ICD-10-CM | POA: Diagnosis not present

## 2022-05-23 DIAGNOSIS — M1712 Unilateral primary osteoarthritis, left knee: Secondary | ICD-10-CM | POA: Diagnosis not present

## 2022-05-23 DIAGNOSIS — M79604 Pain in right leg: Secondary | ICD-10-CM

## 2022-05-23 DIAGNOSIS — T84011S Broken internal left hip prosthesis, sequela: Secondary | ICD-10-CM | POA: Diagnosis not present

## 2022-05-23 NOTE — Patient Instructions (Addendum)
[facility]: [facility]  ([address] [employer] in [facility]) [address], [address] [place], [place] [address] [phone]  [facility] Bone [facility] OA stability brace will call you See me again in 8-10 weeks

## 2022-05-23 NOTE — Assessment & Plan Note (Signed)
Question if this is playing a role as well.  Due to the instability and the abnormal thigh to calf ratio and patient being ambulatory I do think a OA stability brace could be beneficial and help.

## 2022-05-23 NOTE — Assessment & Plan Note (Signed)
Patient unfortunately continues to have difficulty with his left hip and left leg.  Difficult to assess overall.  Does seem to be having some potential instability noted.  Patient does have some underlying knee arthritis that I think is potentially contributing as well.  We could consider the possibility of injections but at this moment I do not think it would make significant changes.  Social determinants of health including patient does have cirrhosis of the liver and biliary cirrhosis that does make this also difficult when it comes to medications.  I do feel that a bone scan could be helpful to make sure that we do not see another occult fracture as well as any type of infectious etiology.  Due to the instability noted of this leg and knee we did discuss an OA stability brace and see if this would be more beneficial.  I would like to get a nerve conduction study to make sure we are not missing anything when it comes to the degenerative disc disease of the lumbar spine as well as considering the ABI to make sure that there is no vascular compromise that could be contributing.  Discussed with patient otherwise she can continue the ibuprofen if she does feel like it is helpful but warned of potential side effects.  Patient has been doing 800 mg 3 times a day regularly for quite some time.  Would need to consider the possibility also of otherwise a more complex surgery to help stabilize the replacement if this continues.  Follow-up after all test to discuss further which likely will take a 6 to 8 weeks.

## 2022-05-30 ENCOUNTER — Encounter: Payer: Self-pay | Admitting: Neurology

## 2022-05-30 ENCOUNTER — Other Ambulatory Visit: Payer: Self-pay

## 2022-05-30 DIAGNOSIS — R202 Paresthesia of skin: Secondary | ICD-10-CM

## 2022-05-31 ENCOUNTER — Other Ambulatory Visit: Payer: Self-pay | Admitting: Gastroenterology

## 2022-05-31 ENCOUNTER — Other Ambulatory Visit (HOSPITAL_COMMUNITY): Payer: Self-pay | Admitting: Family Medicine

## 2022-05-31 DIAGNOSIS — I739 Peripheral vascular disease, unspecified: Secondary | ICD-10-CM

## 2022-06-02 ENCOUNTER — Encounter: Payer: Self-pay | Admitting: Gastroenterology

## 2022-06-03 ENCOUNTER — Ambulatory Visit (HOSPITAL_COMMUNITY)
Admission: RE | Admit: 2022-06-03 | Discharge: 2022-06-03 | Disposition: A | Discharge status: Home / Self Care | Payer: Medicare Other | Source: Ambulatory Visit | Attending: Family Medicine | Admitting: Family Medicine

## 2022-06-03 ENCOUNTER — Other Ambulatory Visit: Payer: Self-pay | Admitting: Family Medicine

## 2022-06-03 DIAGNOSIS — T84021D Dislocation of internal left hip prosthesis, subsequent encounter: Secondary | ICD-10-CM

## 2022-06-03 DIAGNOSIS — M79604 Pain in right leg: Secondary | ICD-10-CM

## 2022-06-03 DIAGNOSIS — T84011S Broken internal left hip prosthesis, sequela: Secondary | ICD-10-CM

## 2022-06-03 DIAGNOSIS — M1712 Unilateral primary osteoarthritis, left knee: Secondary | ICD-10-CM

## 2022-06-03 DIAGNOSIS — I739 Peripheral vascular disease, unspecified: Secondary | ICD-10-CM

## 2022-06-03 DIAGNOSIS — M79605 Pain in left leg: Secondary | ICD-10-CM | POA: Diagnosis not present

## 2022-06-14 ENCOUNTER — Telehealth: Payer: Self-pay

## 2022-06-14 DIAGNOSIS — K745 Biliary cirrhosis, unspecified: Secondary | ICD-10-CM

## 2022-06-14 NOTE — Telephone Encounter (Signed)
-----   Message from [place], [place] sent at 05/13/2022 11:38 AM EDT ----- Regarding: Liver Ultrasound Patient needs liver ultrasound  (RUQ Abdomen Ultrasound) in Nov for [facility] Screening.   Patient needs follow-up appt in Jan 2024.

## 2022-06-18 ENCOUNTER — Inpatient Hospital Stay: Payer: Medicare Other | Attending: Internal Medicine

## 2022-06-18 ENCOUNTER — Inpatient Hospital Stay (HOSPITAL_BASED_OUTPATIENT_CLINIC_OR_DEPARTMENT_OTHER): Payer: Medicare Other | Admitting: Internal Medicine

## 2022-06-18 ENCOUNTER — Encounter: Payer: Self-pay | Admitting: Internal Medicine

## 2022-06-18 VITALS — BP 157/92 | HR 69 | Temp 97.0°F | Resp 18 | Wt 165.0 lb

## 2022-06-18 DIAGNOSIS — D508 Other iron deficiency anemias: Secondary | ICD-10-CM | POA: Diagnosis not present

## 2022-06-18 DIAGNOSIS — D649 Anemia, unspecified: Secondary | ICD-10-CM | POA: Diagnosis not present

## 2022-06-18 DIAGNOSIS — K52831 Collagenous colitis: Secondary | ICD-10-CM | POA: Diagnosis not present

## 2022-06-18 LAB — IRON AND TIBC
Iron: 108 ug/dL (ref 28–170)
Saturation Ratios: 33 % — ABNORMAL HIGH (ref 10.4–31.8)
TIBC: 332 ug/dL (ref 250–450)
UIBC: 224 ug/dL

## 2022-06-18 LAB — CBC WITH DIFFERENTIAL/PLATELET
Abs Immature Granulocytes: 0.02 10*3/uL (ref 0.00–0.07)
Basophils Absolute: 0 10*3/uL (ref 0.0–0.1)
Basophils Relative: 1 %
Eosinophils Absolute: 0 10*3/uL (ref 0.0–0.5)
Eosinophils Relative: 0 %
HCT: 38.5 % (ref 36.0–46.0)
Hemoglobin: 12.8 g/dL (ref 12.0–15.0)
Immature Granulocytes: 0 %
Lymphocytes Relative: 27 %
Lymphs Abs: 1.5 10*3/uL (ref 0.7–4.0)
MCH: 31.5 pg (ref 26.0–34.0)
MCHC: 33.2 g/dL (ref 30.0–36.0)
MCV: 94.8 fL (ref 80.0–100.0)
Monocytes Absolute: 0.4 10*3/uL (ref 0.1–1.0)
Monocytes Relative: 8 %
Neutro Abs: 3.6 10*3/uL (ref 1.7–7.7)
Neutrophils Relative %: 64 %
Platelets: 191 10*3/uL (ref 150–400)
RBC: 4.06 MIL/uL (ref 3.87–5.11)
RDW: 19.7 % — ABNORMAL HIGH (ref 11.5–15.5)
WBC: 5.6 10*3/uL (ref 4.0–10.5)
nRBC: 0 % (ref 0.0–0.2)

## 2022-06-18 LAB — FERRITIN: Ferritin: 92 ng/mL (ref 11–307)

## 2022-06-18 NOTE — Progress Notes (Signed)
[place]  [phone] [phone]  ID: Emma Lewis OB: [birth year]  MR#: [id number]  XBL#:[id number]  Patient Care Team: Philmore Pali, NP (Inactive) as PCP - General (Nurse Practitioner)  REFERRING PHYSICIAN: Dr. Rush Landmark   REASON FOR REFERRAL: iron deficiency anemia   HPI: Emma Lewis is a 71 y.o. female with pmh of collagenous colitis follows with Dr. Dionicio Stall, HLD, hypothyroidism was referred to Hematology for management for IDA.  Patient reports history of diarrhea started in January 2023. She had colonoscopy and endoscopy with Dr. Rush Landmark on 12/26/2021 which showed erythematous mucosa in the stomach, grade 1 and grade 2 esophageal varices with no active bleeding was seen and multiple diverticuli.  H. pylori was negative.  Colon biopsies showed collagenous colitis.  She is following with GI for control of diarrhea and could not tolerate budesonide due to shortness of breath and swelling  She is on IBgard and has better control of her diarrhea.  She reports feeling fatigued.  She is intolerant to oral iron which causes diarrhea.  On review of the labs, patient has a longstanding history of iron deficiency anemia.  Labs from 02/2022 were reviewed which showed ferritin of 20 and iron saturation of 7%.  Hemoglobin is 9.7.    Interval history Patient was seen today post IV iron infusion to discuss labs. She tolerated infusions well.  Her diarrhea is better controlled now.  She is getting every other day.  She was advised by GI to start beta-blockers for her varices which she decided to hold off.  REVIEW OF SYSTEMS:   ROS As per HPI. Otherwise, a complete review of systems is negative.  PAST MEDICAL HISTORY: Past Medical History:  Diagnosis Date   Allergies    Arthritis    "all over my body" (04/29/2018)   Chronic lower back pain    Fatty liver    Fever blister    Takes Acyclovir   GERD (gastroesophageal reflux disease)    Hyperlipemia     Hypothyroidism    Kidney stones    Migraines    Osteopenia    Pneumonia    "once; years ago" (04/29/2018)   PONV (postoperative nausea and vomiting)    "dry heaves"   Pre-diabetes    Seasonal allergies    Vertigo     PAST SURGICAL HISTORY: Past Surgical History:  Procedure Laterality Date   ANTERIOR CERVICAL DECOMP/DISCECTOMY FUSION N/A 02/09/2014   Procedure: ANTERIOR CERVICAL DECOMPRESSION/DISCECTOMY FUSION 2 LEVELS;  Surgeon: [name], MD;  Location: [place];  Service: Orthopedics;  Laterality: N/A;  C4-5, C5-6 Anterior Cervical Discectomy and Fusion, Allograft, Plate   BACK SURGERY     CARPAL TUNNEL RELEASE Right 2018   CATARACT EXTRACTION W/ INTRAOCULAR LENS  IMPLANT, BILATERAL     COLONOSCOPY     FIXATION KYPHOPLASTY LUMBAR SPINE  X 2   HIP ARTHROPLASTY Left 2011   INCISION AND DRAINAGE HIP Left 06/19/2018   Procedure: IRRIGATION AND DEBRIDEMENT LEFT HIP HEMATOMA ANTIBIOTIC BEADS, APPLICATION ON WOUND VAC;  Surgeon: [name], MD;  Location: [place];  Service: Orthopedics;  Laterality: Left;   JOINT REPLACEMENT     KNEE ARTHROSCOPY Right    ORIF PERIPROSTHETIC FRACTURE Left 06/01/2018   Procedure: OPEN REDUCTION INTERNAL FIXATION (ORIF) PERIPROSTHETIC FEMUR FRACTURE;  Surgeon: [name], MD;  Location: [place];  Service: Orthopedics;  Laterality: Left;   PATELLA FRACTURE SURGERY Right ~ 1990   "put metal in"   PATELLA HARDWARE  REMOVAL Right    "took the metal out"   REVISION TOTAL HIP ARTHROPLASTY Left 04/29/2018   ROBOTIC ASSISTED BILATERAL SALPINGO OOPHERECTOMY Bilateral 08/01/2015   Procedure: ROBOTIC ASSISTED BILATERAL SALPINGO OOPHORECTOMY;  Surgeon: Everitt Amber, MD;  Location: [facility];  Service: Gynecology;  Laterality: Bilateral;   SHOULDER OPEN ROTATOR CUFF REPAIR Right    3 TOTAL   SHOULDER SURGERY Right    TOTAL HIP ARTHROPLASTY Right 01/21/2018   Procedure: RIGHT TOTAL HIP ARTHROPLASTY DIRECT ANTERIOR;  Surgeon: [name], MD;  Location: [place];  Service:  Orthopedics;  Laterality: Right;   TOTAL HIP REVISION Left 06/10/2016   Procedure: TOTAL HIP REVISION ARTHROPLASTY;  Surgeon: Rod Can, MD;  Location: [place];  Service: Orthopedics;  Laterality: Left;   TOTAL HIP REVISION Left 04/29/2018   Procedure: left total hip arthroplasty revision posterior approach;  Surgeon: [name], MD;  Location: [place];  Service: Orthopedics;  Laterality: Left;   TOTAL HIP REVISION Left 09/21/2018   Procedure: left total hip arthroplasty femoral revision;  Surgeon: [name], MD;  Location: [place];  Service: Orthopedics;  Laterality: Left;   WRIST GANGLION EXCISION Left 1970s    FAMILY HISTORY: Family History  Problem Relation Age of Onset   Lymphoma Mother    Arthritis Mother    Hyperlipidemia Mother    Colon polyps Father    Diabetes Father    Cirrhosis Father    Hyperlipidemia Father    Colon cancer Neg Hx    Stomach cancer Neg Hx    Esophageal cancer Neg Hx    Inflammatory bowel disease Neg Hx    Liver disease Neg Hx    Pancreatic cancer Neg Hx    Rectal cancer Neg Hx     ADVANCED DIRECTIVES (Y/N):  N  HEALTH MAINTENANCE: Social History   Tobacco Use   Smoking status: Never   Smokeless tobacco: Never  Vaping Use   Vaping Use: Never used  Substance Use Topics   Alcohol use: Not Currently    Comment: `   Drug use: Never     Colonoscopy:  PAP:  Bone density:  Lipid panel:  Allergies  Allergen Reactions   Entocort Ec [Budesonide] Shortness Of Breath and Swelling   Sulfa Antibiotics Other (See Comments)     Childhood allergy Mother said "I liked to have died" UNSPECIFIED "SEVERE CHILDHOOD REACTION"    Current Outpatient Medications  Medication Sig Dispense Refill   acyclovir (ZOVIRAX) 400 MG tablet Take 1 tablet (400 mg total) by mouth 2 (two) times daily. 60 tablet 0   AMBULATORY NON FORMULARY MEDICATION Nutrafol Take 4 capsule by mouth daily     AMBULATORY NON FORMULARY MEDICATION Beet Root Take 2 capsule by  mouth daily     AMBULATORY NON FORMULARY MEDICATION Aspercreme Patch 1 patch topical daily     atorvastatin (LIPITOR) 40 MG tablet Take 40 mg by mouth daily.     B-D UF III MINI PEN NEEDLES 31G X 5 MM MISC Inject into the skin as directed.     Calcium Carb-Cholecalciferol (CALCIUM 600 + D PO) Take 1 tablet by mouth 3 (three) times daily.      Cholecalciferol (VITAMIN D3) 2000 units TABS Take 2,000 Units by mouth 2 (two) times daily.      Coenzyme Q10 (CO Q10) 100 MG CAPS Take 1 capsule by mouth daily.     Cyanocobalamin (VITAMIN B-12) 2500 MCG SUBL Place 1 tablet under the tongue daily.  diphenoxylate-atropine (LOMOTIL) 2.5-0.025 MG tablet TAKE 1-3 TABLETS BY MOUTH 4 TIMES DAILY AS NEEDED FOR  DIARRHEA  OR  LOOSE  STOOLS 30 tablet 0   ibuprofen (ADVIL) 200 MG tablet Take 800 mg by mouth 2 (two) times daily.     levothyroxine (EUTHYROX) 100 MCG tablet Take 100 mcg by mouth daily before breakfast.     Menaquinone-7 (VITAMIN K2) 100 MCG CAPS Take 1 capsule by mouth daily.     nadolol (CORGARD) 20 MG tablet Take 1/2 tab (10 mg) for 1 week then increase to 1 tab (20 mg) daily 30 tablet 6   omeprazole (PRILOSEC OTC) 20 MG tablet Take 10 mg by mouth daily.     Peppermint Oil (IBGARD) 90 MG CPCR Take by mouth. Take 2 capsules by mouth in the mornings and 1-2 tablets at night     Turmeric Curcumin 500 MG CAPS Take 1 capsule by mouth 2 (two) times daily.     No current facility-administered medications for this visit.    OBJECTIVE: Vitals:   06/18/22 1342 06/18/22 1344  BP:  (!) 157/92  Pulse:  69  Resp: 18 18  Temp:  (!) 97 F (36.1 C)  SpO2:  98%     Body mass index is 30.18 kg/m.    ECOG FS:2 - Symptomatic, <50% confined to bed  General: Well-developed, well-nourished, no acute distress. Eyes: Pink conjunctiva, anicteric sclera. HEENT: Normocephalic, moist mucous membranes, clear oropharnyx. Lungs: Clear to auscultation bilaterally. Heart: Regular rate and rhythm. No rubs, murmurs,  or gallops. Abdomen: Soft, nontender, nondistended. No organomegaly noted, normoactive bowel sounds. Musculoskeletal: No edema, cyanosis, or clubbing. Neuro: Alert, answering all questions appropriately. Cranial nerves grossly intact. Skin: No rashes or petechiae noted. Psych: Normal affect. Lymphatics: No cervical, calvicular, axillary or inguinal LAD.   LAB RESULTS:  Lab Results  Component Value Date   NA 140 03/05/2022   K 4.3 03/05/2022   CL 106 03/05/2022   CO2 26 03/05/2022   GLUCOSE 91 03/05/2022   BUN 14 03/05/2022   CREATININE 0.65 03/05/2022   CALCIUM 10.2 03/05/2022   PROT 7.1 03/05/2022   ALBUMIN 3.7 03/05/2022   AST 48 (H) 03/05/2022   ALT 24 03/05/2022   ALKPHOS 182 (H) 03/05/2022   BILITOT 0.6 03/05/2022   GFRNONAA >60 09/21/2018   GFRAA >60 09/21/2018    Lab Results  Component Value Date   WBC 5.6 06/18/2022   NEUTROABS 3.6 06/18/2022   HGB 12.8 06/18/2022   HCT 38.5 06/18/2022   MCV 94.8 06/18/2022   PLT 191 06/18/2022   Iron panel 03/06/2022   STUDIES: COLONOSCOPY 12/26/2021   ENDOSCOPY 12/26/2021   ASSESSMENT AND PLAN:   Emma Lewis is a 71 y.o. female with pmh of collagenous colitis, hypothyroidism and hyperlipidemia allergy clinic for further management of iron deficiency anemia  #Iron deficiency anemia -From 02/2022 ferritin of 20, saturation 7%.  Status post IV Feraheme x 2 in September 2023.  Labs today showed good response to iron. -Intolerant to oral iron due to diarrhea -Had colonoscopy and endoscopy in March 2023 as above.  # Chronic diarrhea # collagenous colitis  - follows with GI. Reports could not tolerate budesonide due to sob and swelling. Currently on Ibgard which is moderately controlling diarrhea.   RTC in 4 months for MD visit and labs. Patient expressed understanding and was in agreement with this plan. She also understands that She can call clinic at any time with any questions, concerns, or complaints.  I provided  35 minutes (3:44 PM - 3:44 PM) of face-to-face time during this this encounter and > 50% was spent counseling as documented under my assessment and plan.    Sherlin Canary, MD   06/18/2022 3:44 PM

## 2022-06-18 NOTE — Progress Notes (Signed)
Patient denies any concerns today.  

## 2022-06-28 DIAGNOSIS — Z20828 Contact with and (suspected) exposure to other viral communicable diseases: Secondary | ICD-10-CM | POA: Diagnosis not present

## 2022-07-02 ENCOUNTER — Ambulatory Visit (INDEPENDENT_AMBULATORY_CARE_PROVIDER_SITE_OTHER): Payer: Medicare Other | Admitting: Neurology

## 2022-07-02 DIAGNOSIS — R202 Paresthesia of skin: Secondary | ICD-10-CM | POA: Diagnosis not present

## 2022-07-02 NOTE — Procedures (Addendum)
[facility]  [address]  [place], [place] [address] Tel: [phone] Fax: [phone] Test Date:  07/02/2022  Patient: Emma Lewis DOB: [birth year] Physician: Narda Amber, DO  Sex: --- Height: '5\' 2"'$  Ref Phys: Hulan Saas, DO  ID#: [id number]   Technician:    History: This is a 71 year old female referred for evaluation of bilateral leg pain.  NCV & EMG Findings: Electrodiagnostic testing of the right lower extremity and additional studies of the left shows: Bilateral sural and superficial peroneal sensory responses are within normal limits. Bilateral peroneal and tibial motor responses are within normal limits. Bilateral tibial H reflex studies are within normal limits. There is no evidence of active or chronic motor axonal changes affecting any of the tested muscles.  Motor unit configuration and recruitment pattern is within normal limits in all of the tested muscles, except for left gluteus medius which showed variable activation.  Impression: This is a normal study of the lower extremities.  In particular, there is no evidence of a large fiber sensorimotor polyneuropathy or lumbosacral radiculopathy.   Of note, left gluteus medius muscle showed incomplete motor unit activation, in isolation, these findings may be due to pain or musculoskeletal pathology.  Correlate clinically.   ___________________________ Narda Amber, DO    Nerve Conduction Studies   Stim Site NR Peak (ms) Norm Peak (ms) O-P Amp (V) Norm O-P Amp  Left Sup Peroneal Anti Sensory (Ant Lat Mall)  33 C  12 cm    2.5 <4.6 6.9 >3  Right Sup Peroneal Anti Sensory (Ant Lat Mall)  33 C  12 cm    2.4 <4.6 5.4 >3  Left Sural Anti Sensory (Lat Mall)  33 C  Calf    2.8 <4.6 5.4 >3  Right Sural Anti Sensory (Lat Mall)  33 C  Calf    3.0 <4.6 6.0 >3     Stim Site NR Onset (ms) Norm Onset (ms) O-P Amp (mV) Norm O-P Amp Site1 Site2 Delta-0 (ms) Dist (cm) Vel (m/s) Norm Vel (m/s)   Left Peroneal Motor (Ext Dig Brev)  33 C  Ankle    4.8 <6.0 2.5 >2.5 B Fib Ankle 7.0 37.0 53 >40  B Fib    11.8  2.2  Poplt B Fib 1.5 7.0 47 >40  Poplt    13.3  2.1         Right Peroneal Motor (Ext Dig Brev)  33 C  Ankle    4.1 <6.0 2.5 >2.5 B Fib Ankle 7.2 35.0 49 >40  B Fib    11.3  1.9  Poplt B Fib 1.7 8.0 47 >40  Poplt    13.0  1.8         Left Peroneal TA Motor (Tib Ant)  33 C  Fib Head    2.6 <4.5 4.3 >3 Poplit Fib Head 1.7 7.0 41 >40  Poplit    4.3 <5.7 3.6         Left Tibial Motor (Abd Hall Brev)  33 C  Ankle    2.8 <6.0 12.0 >4 Knee Ankle 8.1 37.0 46 >40  Knee    10.9  6.5         Right Tibial Motor (Abd Hall Brev)  33 C  Ankle    3.2 <6.0 11.6 >4 Knee Ankle 7.9 40.0 51 >40  Knee    11.1  8.4          Electromyography   Side Muscle Ins.Act Fibs  Fasc Recrt Amp Dur Poly Activation Comment  Right AntTibialis Nml Nml Nml Nml Nml Nml Nml Nml N/A  Right Gastroc Nml Nml Nml Nml Nml Nml Nml Nml N/A  Right Flex Dig Long Nml Nml Nml Nml Nml Nml Nml Nml N/A  Right RectFemoris Nml Nml Nml Nml Nml Nml Nml Nml N/A  Right GluteusMed Nml Nml Nml Nml Nml Nml Nml Nml N/A  Left AntTibialis Nml Nml Nml Nml Nml Nml Nml Nml N/A  Left Gastroc Nml Nml Nml Nml Nml Nml Nml Nml N/A  Left Flex Dig Long Nml Nml Nml Nml Nml Nml Nml Nml N/A  Left RectFemoris Nml Nml Nml Nml Nml Nml Nml Nml N/A  Left GluteusMed Nml Nml Nml Nml Nml Nml Nml *2- N/A      Waveforms:

## 2022-07-04 ENCOUNTER — Ambulatory Visit: Payer: Medicare Other

## 2022-07-05 ENCOUNTER — Ambulatory Visit
Admission: RE | Admit: 2022-07-05 | Discharge: 2022-07-05 | Disposition: A | Discharge status: Home / Self Care | Payer: Medicare Other | Source: Ambulatory Visit | Attending: Gastroenterology | Admitting: Gastroenterology

## 2022-07-05 DIAGNOSIS — K745 Biliary cirrhosis, unspecified: Secondary | ICD-10-CM

## 2022-07-05 DIAGNOSIS — K7460 Unspecified cirrhosis of liver: Secondary | ICD-10-CM | POA: Diagnosis not present

## 2022-07-15 NOTE — Progress Notes (Deleted)
Emma [place] Phone: [phone] Subjective:    I'm seeing this patient by the request  of:  Philmore Pali, NP (Inactive)  CC:   [id number]  05/23/2022 Patient unfortunately continues to have difficulty with his left hip and left leg.  Difficult to assess overall.  Does seem to be having some potential instability noted.  Patient does have some underlying knee arthritis that I think is potentially contributing as well.  We could consider the possibility of injections but at this moment I do not think it would make significant changes.  Social determinants of health including patient does have cirrhosis of the liver and biliary cirrhosis that does make this also difficult when it comes to medications.  I do feel that a bone scan could be helpful to make sure that we do not see another occult fracture as well as any type of infectious etiology.  Due to the instability noted of this leg and knee we did discuss an OA stability brace and see if this would be more beneficial.  I would like to get a nerve conduction study to make sure we are not missing anything when it comes to the degenerative disc disease of the lumbar spine as well as considering the ABI to make sure that there is no vascular compromise that could be contributing.  Discussed with patient otherwise she can continue the ibuprofen if she does feel like it is helpful but warned of potential side effects.  Patient has been doing 800 mg 3 times a day regularly for quite some time.  Would need to consider the possibility also of otherwise a more complex surgery to help stabilize the replacement if this continues.  Follow-up after all test to discuss further which likely will take a 6 to 8 weeks.     Question if this is playing a role as well.  Due to the instability and the abnormal thigh to calf ratio and patient being ambulatory I do think a OA stability brace could be  beneficial and help.      Update 07/18/2022 Emma Lewis is a 71 y.o. female coming in with complaint of L knee and L hip pain. Patient states   Onset-  Location Duration-  Character- Aggravating factors- Reliving factors-  Therapies tried-  Severity-     Past Medical History:  Diagnosis Date   Allergies    Arthritis    "all over my body" (04/29/2018)   Chronic lower back pain    Fatty liver    Fever blister    Takes Acyclovir   GERD (gastroesophageal reflux disease)    Hyperlipemia    Hypothyroidism    Kidney stones    Migraines    Osteopenia    Pneumonia    "once; years ago" (04/29/2018)   PONV (postoperative nausea and vomiting)    "dry heaves"   Pre-diabetes    Seasonal allergies    Vertigo    Past Surgical History:  Procedure Laterality Date   ANTERIOR CERVICAL DECOMP/DISCECTOMY FUSION N/A 02/09/2014   Procedure: ANTERIOR CERVICAL DECOMPRESSION/DISCECTOMY FUSION 2 LEVELS;  Surgeon: [name], MD;  Location: [place];  Service: Orthopedics;  Laterality: N/A;  C4-5, C5-6 Anterior Cervical Discectomy and Fusion, Allograft, Plate   BACK SURGERY     CARPAL TUNNEL RELEASE Right 2018   CATARACT EXTRACTION W/ INTRAOCULAR LENS  IMPLANT, BILATERAL     COLONOSCOPY     FIXATION KYPHOPLASTY LUMBAR  SPINE  X 2   HIP ARTHROPLASTY Left 2011   INCISION AND DRAINAGE HIP Left 06/19/2018   Procedure: IRRIGATION AND DEBRIDEMENT LEFT HIP HEMATOMA ANTIBIOTIC BEADS, APPLICATION ON WOUND VAC;  Surgeon: [name], MD;  Location: [place];  Service: Orthopedics;  Laterality: Left;   JOINT REPLACEMENT     KNEE ARTHROSCOPY Right    ORIF PERIPROSTHETIC FRACTURE Left 06/01/2018   Procedure: OPEN REDUCTION INTERNAL FIXATION (ORIF) PERIPROSTHETIC FEMUR FRACTURE;  Surgeon: [name], MD;  Location: [place];  Service: Orthopedics;  Laterality: Left;   PATELLA FRACTURE SURGERY Right ~ 1990   "put metal in"   PATELLA HARDWARE REMOVAL Right    "took the metal out"   [place] Left 04/29/2018   ROBOTIC ASSISTED BILATERAL SALPINGO OOPHERECTOMY Bilateral 08/01/2015   Procedure: ROBOTIC ASSISTED BILATERAL SALPINGO OOPHORECTOMY;  Surgeon: Everitt Amber, MD;  Location: [facility];  Service: Gynecology;  Laterality: Bilateral;   SHOULDER OPEN ROTATOR CUFF REPAIR Right    3 TOTAL   SHOULDER SURGERY Right    TOTAL HIP ARTHROPLASTY Right 01/21/2018   Procedure: RIGHT TOTAL HIP ARTHROPLASTY DIRECT ANTERIOR;  Surgeon: [name], MD;  Location: [place];  Service: Orthopedics;  Laterality: Right;   TOTAL HIP REVISION Left 06/10/2016   Procedure: TOTAL HIP REVISION ARTHROPLASTY;  Surgeon: Rod Can, MD;  Location: [place];  Service: Orthopedics;  Laterality: Left;   TOTAL HIP REVISION Left 04/29/2018   Procedure: left total hip arthroplasty revision posterior approach;  Surgeon: [name], MD;  Location: [place];  Service: Orthopedics;  Laterality: Left;   TOTAL HIP REVISION Left 09/21/2018   Procedure: left total hip arthroplasty femoral revision;  Surgeon: [name], MD;  Location: [place];  Service: Orthopedics;  Laterality: Left;   WRIST GANGLION EXCISION Left 1970s   Social History   Socioeconomic History   Marital status: Married    Spouse name: Not on file   Number of children: 1   Years of education: Not on file   Highest education level: Not on file  Occupational History   Not on file  Tobacco Use   Smoking status: Never   Smokeless tobacco: Never  Vaping Use   Vaping Use: Never used  Substance and Sexual Activity   Alcohol use: Not Currently    Comment: `   Drug use: Never   Sexual activity: Not Currently  Other Topics Concern   Not on file  Social History Narrative   Not on file   Social Determinants of Health   Financial Resource Strain: Not on file  Food Insecurity: Not on file  Transportation Needs: Not on file  Physical Activity: Not on file  Stress: Not on file  Social Connections: Not on file   Allergies  Allergen Reactions    Entocort Ec [Budesonide] Shortness Of Breath and Swelling   Sulfa Antibiotics Other (See Comments)     Childhood allergy Mother said "I liked to have died" UNSPECIFIED "SEVERE CHILDHOOD REACTION"   Family History  Problem Relation Age of Onset   Lymphoma Mother    Arthritis Mother    Hyperlipidemia Mother    Colon polyps Father    Diabetes Father    Cirrhosis Father    Hyperlipidemia Father    Colon cancer Neg Hx    Stomach cancer Neg Hx    Esophageal cancer Neg Hx    Inflammatory bowel disease Neg Hx    Liver disease Neg Hx  Pancreatic cancer Neg Hx    Rectal cancer Neg Hx     Current Outpatient Medications (Endocrine & Metabolic):    levothyroxine (EUTHYROX) 100 MCG tablet, Take 100 mcg by mouth daily before breakfast.  Current Outpatient Medications (Cardiovascular):    atorvastatin (LIPITOR) 40 MG tablet, Take 40 mg by mouth daily.   nadolol (CORGARD) 20 MG tablet, Take 1/2 tab (10 mg) for 1 week then increase to 1 tab (20 mg) daily   Current Outpatient Medications (Analgesics):    ibuprofen (ADVIL) 200 MG tablet, Take 800 mg by mouth 2 (two) times daily.  Current Outpatient Medications (Hematological):    Cyanocobalamin (VITAMIN B-12) 2500 MCG SUBL, Place 1 tablet under the tongue daily.  Current Outpatient Medications (Other):    acyclovir (ZOVIRAX) 400 MG tablet, Take 1 tablet (400 mg total) by mouth 2 (two) times daily.   AMBULATORY NON FORMULARY MEDICATION, Nutrafol Take 4 capsule by mouth daily   AMBULATORY NON FORMULARY MEDICATION, Beet Root Take 2 capsule by mouth daily   AMBULATORY NON FORMULARY MEDICATION, Aspercreme Patch 1 patch topical daily   B-D UF III MINI PEN NEEDLES 31G X 5 MM MISC, Inject into the skin as directed.   Calcium Carb-Cholecalciferol (CALCIUM 600 + D PO), Take 1 tablet by mouth 3 (three) times daily.    Cholecalciferol (VITAMIN D3) 2000 units TABS, Take 2,000 Units by mouth 2 (two) times daily.    Coenzyme Q10 (CO Q10) 100 MG CAPS,  Take 1 capsule by mouth daily.   diphenoxylate-atropine (LOMOTIL) 2.5-0.025 MG tablet, TAKE 1-3 TABLETS BY MOUTH 4 TIMES DAILY AS NEEDED FOR  DIARRHEA  OR  LOOSE  STOOLS   Menaquinone-7 (VITAMIN K2) 100 MCG CAPS, Take 1 capsule by mouth daily.   omeprazole (PRILOSEC OTC) 20 MG tablet, Take 10 mg by mouth daily.   Peppermint Oil (IBGARD) 90 MG CPCR, Take by mouth. Take 2 capsules by mouth in the mornings and 1-2 tablets at night   Turmeric Curcumin 500 MG CAPS, Take 1 capsule by mouth 2 (two) times daily.   Reviewed prior external information including notes and imaging from  primary care provider As well as notes that were available from care everywhere and other healthcare systems.  Past medical history, social, surgical and family history all reviewed in electronic medical record.  No pertanent information unless stated regarding to the chief complaint.   Review of Systems:  No headache, visual changes, nausea, vomiting, diarrhea, constipation, dizziness, abdominal pain, skin rash, fevers, chills, night sweats, weight loss, swollen lymph nodes, body aches, joint swelling, chest pain, shortness of breath, mood changes. POSITIVE muscle aches  Objective  There were no vitals taken for this visit.   General: No apparent distress alert and oriented x3 mood and affect normal, dressed appropriately.  HEENT: Pupils equal, extraocular movements intact  Respiratory: Patient's speak in full sentences and does not appear short of breath  Cardiovascular: No lower extremity edema, non tender, no erythema      Impression and Recommendations:

## 2022-07-18 ENCOUNTER — Ambulatory Visit: Payer: Medicare Other | Admitting: Family Medicine

## 2022-07-19 DIAGNOSIS — R051 Acute cough: Secondary | ICD-10-CM | POA: Diagnosis not present

## 2022-07-19 DIAGNOSIS — J069 Acute upper respiratory infection, unspecified: Secondary | ICD-10-CM | POA: Diagnosis not present

## 2022-07-31 DIAGNOSIS — M8580 Other specified disorders of bone density and structure, unspecified site: Secondary | ICD-10-CM | POA: Diagnosis not present

## 2022-07-31 DIAGNOSIS — I700 Atherosclerosis of aorta: Secondary | ICD-10-CM | POA: Diagnosis not present

## 2022-07-31 DIAGNOSIS — D649 Anemia, unspecified: Secondary | ICD-10-CM | POA: Diagnosis not present

## 2022-07-31 DIAGNOSIS — K7460 Unspecified cirrhosis of liver: Secondary | ICD-10-CM | POA: Diagnosis not present

## 2022-07-31 DIAGNOSIS — E785 Hyperlipidemia, unspecified: Secondary | ICD-10-CM | POA: Diagnosis not present

## 2022-07-31 DIAGNOSIS — B001 Herpesviral vesicular dermatitis: Secondary | ICD-10-CM | POA: Diagnosis not present

## 2022-07-31 DIAGNOSIS — B372 Candidiasis of skin and nail: Secondary | ICD-10-CM | POA: Diagnosis not present

## 2022-07-31 DIAGNOSIS — R7303 Prediabetes: Secondary | ICD-10-CM | POA: Diagnosis not present

## 2022-07-31 DIAGNOSIS — E039 Hypothyroidism, unspecified: Secondary | ICD-10-CM | POA: Diagnosis not present

## 2022-07-31 DIAGNOSIS — Z6827 Body mass index (BMI) 27.0-27.9, adult: Secondary | ICD-10-CM | POA: Diagnosis not present

## 2022-07-31 DIAGNOSIS — E559 Vitamin D deficiency, unspecified: Secondary | ICD-10-CM | POA: Diagnosis not present

## 2022-08-20 ENCOUNTER — Ambulatory Visit: Payer: Medicare Other | Admitting: Orthopaedic Surgery

## 2022-08-21 ENCOUNTER — Encounter: Payer: Self-pay | Admitting: Gastroenterology

## 2022-08-22 ENCOUNTER — Encounter: Payer: Self-pay | Admitting: Gastroenterology

## 2022-08-22 NOTE — Progress Notes (Signed)
[place] Phone: [phone] Subjective:   Emma Lewis, am serving as a scribe for Dr. Hulan Saas.  I'm seeing this patient by the request  of:  Philmore Pali, NP (Inactive)  CC: Left hip and leg pain  [id number]  05/23/2022 Patient unfortunately continues to have difficulty with his left hip and left leg.  Difficult to assess overall.  Does seem to be having some potential instability noted.  Patient does have some underlying knee arthritis that I think is potentially contributing as well.  We could consider the possibility of injections but at this moment I do not think it would make significant changes.  Social determinants of health including patient does have cirrhosis of the liver and biliary cirrhosis that does make this also difficult when it comes to medications.  I do feel that a bone scan could be helpful to make sure that we do not see another occult fracture as well as any type of infectious etiology.  Due to the instability noted of this leg and knee we did discuss an OA stability brace and see if this would be more beneficial.  I would like to get a nerve conduction study to make sure we are not missing anything when it comes to the degenerative disc disease of the lumbar spine as well as considering the ABI to make sure that there is Lewis vascular compromise that could be contributing.  Discussed with patient otherwise she can continue the ibuprofen if she does feel like it is helpful but warned of potential side effects.  Patient has been doing 800 mg 3 times a day regularly for quite some time.  Would need to consider the possibility also of otherwise a more complex surgery to help stabilize the replacement if this continues.  Follow-up after all test to discuss further which likely will take a 6 to 8 weeks.     Update 08/23/2022 Emma Lewis is a 72 y.o. female coming in with complaint of L hip and L leg  pain. Patient states that the custom knee brace has been helpful. States that her knee pain increases with cold weather.   Continues to have nerve pain that runs from L glute down the back of her leg. Weather also makes the pain in her L hip worse.   Patient also c/o pain in her cervical spine for past month. Usually feels like she can get rid of muscle spasms but this time pain is persisting.        Past Medical History:  Diagnosis Date   Allergies    Arthritis    "all over my body" (04/29/2018)   Chronic lower back pain    Fatty liver    Fever blister    Takes Acyclovir   GERD (gastroesophageal reflux disease)    Hyperlipemia    Hypothyroidism    Kidney stones    Migraines    Osteopenia    Pneumonia    "once; years ago" (04/29/2018)   PONV (postoperative nausea and vomiting)    "dry heaves"   Pre-diabetes    Seasonal allergies    Vertigo    Past Surgical History:  Procedure Laterality Date   ANTERIOR CERVICAL DECOMP/DISCECTOMY FUSION N/A 02/09/2014   Procedure: ANTERIOR CERVICAL DECOMPRESSION/DISCECTOMY FUSION 2 LEVELS;  Surgeon: [name], MD;  Location: [place];  Service: Orthopedics;  Laterality: N/A;  C4-5, C5-6 Anterior Cervical Discectomy and Fusion, Allograft, Plate  BACK SURGERY     CARPAL TUNNEL RELEASE Right 2018   CATARACT EXTRACTION W/ INTRAOCULAR LENS  IMPLANT, BILATERAL     COLONOSCOPY     FIXATION KYPHOPLASTY LUMBAR SPINE  X 2   HIP ARTHROPLASTY Left 2011   INCISION AND DRAINAGE HIP Left 06/19/2018   Procedure: IRRIGATION AND DEBRIDEMENT LEFT HIP HEMATOMA ANTIBIOTIC BEADS, APPLICATION ON WOUND VAC;  Surgeon: [name], MD;  Location: [place];  Service: Orthopedics;  Laterality: Left;   JOINT REPLACEMENT     KNEE ARTHROSCOPY Right    ORIF PERIPROSTHETIC FRACTURE Left 06/01/2018   Procedure: OPEN REDUCTION INTERNAL FIXATION (ORIF) PERIPROSTHETIC FEMUR FRACTURE;  Surgeon: [name], MD;  Location: [place];  Service: Orthopedics;  Laterality: Left;    PATELLA FRACTURE SURGERY Right ~ 1990   "put metal in"   PATELLA HARDWARE REMOVAL Right    "took the metal out"   [place] Left 04/29/2018   ROBOTIC ASSISTED BILATERAL SALPINGO OOPHERECTOMY Bilateral 08/01/2015   Procedure: ROBOTIC ASSISTED BILATERAL SALPINGO OOPHORECTOMY;  Surgeon: Everitt Amber, MD;  Location: [facility];  Service: Gynecology;  Laterality: Bilateral;   SHOULDER OPEN ROTATOR CUFF REPAIR Right    3 TOTAL   SHOULDER SURGERY Right    TOTAL HIP ARTHROPLASTY Right 01/21/2018   Procedure: RIGHT TOTAL HIP ARTHROPLASTY DIRECT ANTERIOR;  Surgeon: [name], MD;  Location: [place];  Service: Orthopedics;  Laterality: Right;   TOTAL HIP REVISION Left 06/10/2016   Procedure: TOTAL HIP REVISION ARTHROPLASTY;  Surgeon: Rod Can, MD;  Location: [place];  Service: Orthopedics;  Laterality: Left;   TOTAL HIP REVISION Left 04/29/2018   Procedure: left total hip arthroplasty revision posterior approach;  Surgeon: [name], MD;  Location: [place];  Service: Orthopedics;  Laterality: Left;   TOTAL HIP REVISION Left 09/21/2018   Procedure: left total hip arthroplasty femoral revision;  Surgeon: [name], MD;  Location: [place];  Service: Orthopedics;  Laterality: Left;   WRIST GANGLION EXCISION Left 1970s   Social History   Socioeconomic History   Marital status: Married    Spouse name: Not on file   Number of children: 1   Years of education: Not on file   Highest education level: Not on file  Occupational History   Not on file  Tobacco Use   Smoking status: Never   Smokeless tobacco: Never  Vaping Use   Vaping Use: Never used  Substance and Sexual Activity   Alcohol use: Not Currently    Comment: `   Drug use: Never   Sexual activity: Not Currently  Other Topics Concern   Not on file  Social History Narrative   Not on file   Social Determinants of Health   Financial Resource Strain: Not on file  Food Insecurity: Not on file  Transportation Needs:  Not on file  Physical Activity: Not on file  Stress: Not on file  Social Connections: Not on file   Allergies  Allergen Reactions   Entocort Ec [Budesonide] Shortness Of Breath and Swelling   Sulfa Antibiotics Other (See Comments)     Childhood allergy Mother said "I liked to have died" UNSPECIFIED "SEVERE CHILDHOOD REACTION"   Family History  Problem Relation Age of Onset   Lymphoma Mother    Arthritis Mother    Hyperlipidemia Mother    Colon polyps Father    Diabetes Father    Cirrhosis Father    Hyperlipidemia Father    Colon cancer Neg  Hx    Stomach cancer Neg Hx    Esophageal cancer Neg Hx    Inflammatory bowel disease Neg Hx    Liver disease Neg Hx    Pancreatic cancer Neg Hx    Rectal cancer Neg Hx     Current Outpatient Medications (Endocrine & Metabolic):    levothyroxine (EUTHYROX) 100 MCG tablet, Take 100 mcg by mouth daily before breakfast.  Current Outpatient Medications (Cardiovascular):    atorvastatin (LIPITOR) 40 MG tablet, Take 40 mg by mouth daily.   nadolol (CORGARD) 20 MG tablet, Take 1/2 tab (10 mg) for 1 week then increase to 1 tab (20 mg) daily   Current Outpatient Medications (Analgesics):    ibuprofen (ADVIL) 200 MG tablet, Take 800 mg by mouth 2 (two) times daily.  Current Outpatient Medications (Hematological):    Cyanocobalamin (VITAMIN B-12) 2500 MCG SUBL, Place 1 tablet under the tongue daily.  Current Outpatient Medications (Other):    acyclovir (ZOVIRAX) 400 MG tablet, Take 1 tablet (400 mg total) by mouth 2 (two) times daily.   AMBULATORY NON FORMULARY MEDICATION, Nutrafol Take 4 capsule by mouth daily   AMBULATORY NON FORMULARY MEDICATION, Beet Root Take 2 capsule by mouth daily   AMBULATORY NON FORMULARY MEDICATION, Aspercreme Patch 1 patch topical daily   B-D UF III MINI PEN NEEDLES 31G X 5 MM MISC, Inject into the skin as directed.   Calcium Carb-Cholecalciferol (CALCIUM 600 + D PO), Take 1 tablet by mouth 3 (three) times daily.     Cholecalciferol (VITAMIN D3) 2000 units TABS, Take 2,000 Units by mouth 2 (two) times daily.    Coenzyme Q10 (CO Q10) 100 MG CAPS, Take 1 capsule by mouth daily.   diphenoxylate-atropine (LOMOTIL) 2.5-0.025 MG tablet, TAKE 1-3 TABLETS BY MOUTH 4 TIMES DAILY AS NEEDED FOR  DIARRHEA  OR  LOOSE  STOOLS   Menaquinone-7 (VITAMIN K2) 100 MCG CAPS, Take 1 capsule by mouth daily.   omeprazole (PRILOSEC OTC) 20 MG tablet, Take 10 mg by mouth daily.   Peppermint Oil (IBGARD) 90 MG CPCR, Take by mouth. Take 2 capsules by mouth in the mornings and 1-2 tablets at night   Turmeric Curcumin 500 MG CAPS, Take 1 capsule by mouth 2 (two) times daily.   Reviewed prior external information including notes and imaging from  primary care provider As well as notes that were available from care everywhere and other healthcare systems.  Past medical history, social, surgical and family history all reviewed in electronic medical record.  Lewis pertanent information unless stated regarding to the chief complaint.   Review of Systems:  Lewis headache, visual changes, nausea, vomiting, diarrhea, constipation, dizziness, abdominal pain, skin rash, fevers, chills, night sweats, weight loss, swollen lymph nodes,  joint swelling, chest pain, shortness of breath, mood changes. POSITIVE muscle aches, body aches  Objective  Blood pressure 108/72, pulse 81, height '5\' 2"'$  (1.575 m), weight 168 lb (76.2 kg), SpO2 98 %.   General: Lewis apparent distress alert and oriented x3 mood and affect normal, dressed appropriately.  HEENT: Pupils equal, extraocular movements intact  Respiratory: Patient's speak in full sentences and does not appear short of breath  Cardiovascular: Lewis lower extremity edema, non tender, Lewis erythema  Neck exam does have some loss of lordosis.  Patient's low back does have some limited range of motion.  Still has weakness of the left leg compared to the contralateral side.  Limited range of motion of the hip in all  planes.  Some of  this is secondary to voluntary guarding.    Impression and Recommendations:    The above documentation has been reviewed and is accurate and complete Emma Pulley, DO

## 2022-08-23 ENCOUNTER — Ambulatory Visit (INDEPENDENT_AMBULATORY_CARE_PROVIDER_SITE_OTHER): Payer: Medicare Other | Admitting: Family Medicine

## 2022-08-23 ENCOUNTER — Ambulatory Visit (INDEPENDENT_AMBULATORY_CARE_PROVIDER_SITE_OTHER): Payer: Medicare Other

## 2022-08-23 VITALS — BP 108/72 | HR 81 | Ht 62.0 in | Wt 168.0 lb

## 2022-08-23 DIAGNOSIS — M4312 Spondylolisthesis, cervical region: Secondary | ICD-10-CM | POA: Diagnosis not present

## 2022-08-23 DIAGNOSIS — M542 Cervicalgia: Secondary | ICD-10-CM

## 2022-08-23 DIAGNOSIS — M79605 Pain in left leg: Secondary | ICD-10-CM

## 2022-08-23 DIAGNOSIS — M25552 Pain in left hip: Secondary | ICD-10-CM | POA: Diagnosis not present

## 2022-08-23 DIAGNOSIS — T84011S Broken internal left hip prosthesis, sequela: Secondary | ICD-10-CM | POA: Diagnosis not present

## 2022-08-23 DIAGNOSIS — M6281 Muscle weakness (generalized): Secondary | ICD-10-CM | POA: Diagnosis not present

## 2022-08-23 NOTE — Patient Instructions (Addendum)
Bone scan [phone] Will send you a message when we have results

## 2022-08-23 NOTE — Assessment & Plan Note (Signed)
Patient has had failed total hip arthroplasty previously as well as hematoma.  Patient's chronic comorbidities including cirrhosis does make it significantly difficult for treatment options for this individual.  Patient was to get a bone scan and never did.  I do think that this could be potentially contributing and has some potential instability of the hip and then will need to consider if she needs another revision or not.  In addition to this patient has made some improvement with the OA stability brace but still has weakness.  Nerve conduction assessment does show the patient does have some difficulty with the gluteal muscles but this could be from the postsurgical changes of the hip.  Does not appear to be more of the lumbar radiculopathy.  We discussed with patient again at great length of different treatment options.  Patient does not want to try any extra medications, does not want to do physical therapy.  We discussed we will do the bone scan and then discuss further options thereafter.  Total time with patient 32 minutes

## 2022-08-27 ENCOUNTER — Telehealth: Payer: Self-pay | Admitting: Family Medicine

## 2022-08-27 NOTE — Telephone Encounter (Signed)
1/26 DOS we ordered a Nuclear Medicine Bone scan but gave pt the ph # for Dexa's.  Is this for a regular Dexa or for the Nuclear Medicine testing? NM testing at [facility] is scheduled at [phone]. I can inform pt, just want to be sure what is needed.

## 2022-08-27 NOTE — Telephone Encounter (Signed)
1/26 DOS we ordered a Nuclear Medicine

## 2022-08-27 NOTE — Telephone Encounter (Signed)
Sent info to pt via MyChart, confirmed with hospital scheduling.

## 2022-08-27 NOTE — Telephone Encounter (Signed)
Order is correct. Almyra Free will let patient know phone number.

## 2022-08-29 DIAGNOSIS — N3946 Mixed incontinence: Secondary | ICD-10-CM | POA: Diagnosis not present

## 2022-08-29 DIAGNOSIS — R350 Frequency of micturition: Secondary | ICD-10-CM | POA: Diagnosis not present

## 2022-09-03 DIAGNOSIS — M542 Cervicalgia: Secondary | ICD-10-CM | POA: Diagnosis not present

## 2022-09-03 DIAGNOSIS — E785 Hyperlipidemia, unspecified: Secondary | ICD-10-CM | POA: Diagnosis not present

## 2022-09-03 DIAGNOSIS — E039 Hypothyroidism, unspecified: Secondary | ICD-10-CM | POA: Diagnosis not present

## 2022-09-03 DIAGNOSIS — K580 Irritable bowel syndrome with diarrhea: Secondary | ICD-10-CM | POA: Diagnosis not present

## 2022-09-03 DIAGNOSIS — R030 Elevated blood-pressure reading, without diagnosis of hypertension: Secondary | ICD-10-CM | POA: Diagnosis not present

## 2022-09-03 DIAGNOSIS — K52832 Lymphocytic colitis: Secondary | ICD-10-CM | POA: Diagnosis not present

## 2022-09-03 DIAGNOSIS — K219 Gastro-esophageal reflux disease without esophagitis: Secondary | ICD-10-CM | POA: Diagnosis not present

## 2022-09-03 DIAGNOSIS — K7460 Unspecified cirrhosis of liver: Secondary | ICD-10-CM | POA: Diagnosis not present

## 2022-09-03 DIAGNOSIS — D509 Iron deficiency anemia, unspecified: Secondary | ICD-10-CM | POA: Diagnosis not present

## 2022-09-09 ENCOUNTER — Encounter
Admission: RE | Admit: 2022-09-09 | Discharge: 2022-09-09 | Disposition: A | Discharge status: Home / Self Care | Payer: Medicare Other | Source: Ambulatory Visit | Attending: Family Medicine | Admitting: Family Medicine

## 2022-09-09 ENCOUNTER — Other Ambulatory Visit: Payer: Self-pay | Admitting: Family Medicine

## 2022-09-09 DIAGNOSIS — T84021D Dislocation of internal left hip prosthesis, subsequent encounter: Secondary | ICD-10-CM

## 2022-09-09 DIAGNOSIS — M79605 Pain in left leg: Secondary | ICD-10-CM | POA: Diagnosis not present

## 2022-09-09 DIAGNOSIS — M1712 Unilateral primary osteoarthritis, left knee: Secondary | ICD-10-CM

## 2022-09-09 DIAGNOSIS — Z96642 Presence of left artificial hip joint: Secondary | ICD-10-CM | POA: Diagnosis not present

## 2022-09-09 DIAGNOSIS — T84011S Broken internal left hip prosthesis, sequela: Secondary | ICD-10-CM

## 2022-09-09 DIAGNOSIS — M79604 Pain in right leg: Secondary | ICD-10-CM

## 2022-09-09 DIAGNOSIS — Z471 Aftercare following joint replacement surgery: Secondary | ICD-10-CM | POA: Diagnosis not present

## 2022-09-09 MED ORDER — TECHNETIUM TC 99M MEDRONATE IV KIT
20.0000 | PACK | Freq: Once | INTRAVENOUS | Status: AC | PRN
  Administered 2022-09-09: 21.93 via INTRAVENOUS

## 2022-09-26 DIAGNOSIS — Z0000 Encounter for general adult medical examination without abnormal findings: Secondary | ICD-10-CM | POA: Diagnosis not present

## 2022-09-26 DIAGNOSIS — E785 Hyperlipidemia, unspecified: Secondary | ICD-10-CM | POA: Diagnosis not present

## 2022-09-26 DIAGNOSIS — R030 Elevated blood-pressure reading, without diagnosis of hypertension: Secondary | ICD-10-CM | POA: Diagnosis not present

## 2022-09-26 DIAGNOSIS — D509 Iron deficiency anemia, unspecified: Secondary | ICD-10-CM | POA: Diagnosis not present

## 2022-09-26 DIAGNOSIS — K7460 Unspecified cirrhosis of liver: Secondary | ICD-10-CM | POA: Diagnosis not present

## 2022-09-26 DIAGNOSIS — E039 Hypothyroidism, unspecified: Secondary | ICD-10-CM | POA: Diagnosis not present

## 2022-10-03 DIAGNOSIS — Z1231 Encounter for screening mammogram for malignant neoplasm of breast: Secondary | ICD-10-CM | POA: Diagnosis not present

## 2022-10-03 DIAGNOSIS — K219 Gastro-esophageal reflux disease without esophagitis: Secondary | ICD-10-CM | POA: Diagnosis not present

## 2022-10-03 DIAGNOSIS — K7460 Unspecified cirrhosis of liver: Secondary | ICD-10-CM | POA: Diagnosis not present

## 2022-10-03 DIAGNOSIS — M810 Age-related osteoporosis without current pathological fracture: Secondary | ICD-10-CM | POA: Diagnosis not present

## 2022-10-03 DIAGNOSIS — D509 Iron deficiency anemia, unspecified: Secondary | ICD-10-CM | POA: Diagnosis not present

## 2022-10-03 DIAGNOSIS — Z0000 Encounter for general adult medical examination without abnormal findings: Secondary | ICD-10-CM | POA: Diagnosis not present

## 2022-10-03 DIAGNOSIS — E785 Hyperlipidemia, unspecified: Secondary | ICD-10-CM | POA: Diagnosis not present

## 2022-10-03 DIAGNOSIS — E039 Hypothyroidism, unspecified: Secondary | ICD-10-CM | POA: Diagnosis not present

## 2022-10-17 ENCOUNTER — Encounter: Payer: Self-pay | Admitting: Internal Medicine

## 2022-10-17 ENCOUNTER — Inpatient Hospital Stay: Payer: Medicare Other | Attending: Internal Medicine

## 2022-10-17 ENCOUNTER — Inpatient Hospital Stay (HOSPITAL_BASED_OUTPATIENT_CLINIC_OR_DEPARTMENT_OTHER): Payer: Medicare Other | Admitting: Internal Medicine

## 2022-10-17 VITALS — BP 127/58 | HR 76 | Temp 97.8°F | Resp 16 | Wt 163.0 lb

## 2022-10-17 DIAGNOSIS — K52831 Collagenous colitis: Secondary | ICD-10-CM | POA: Diagnosis not present

## 2022-10-17 DIAGNOSIS — D508 Other iron deficiency anemias: Secondary | ICD-10-CM

## 2022-10-17 DIAGNOSIS — D649 Anemia, unspecified: Secondary | ICD-10-CM

## 2022-10-17 LAB — CBC WITH DIFFERENTIAL/PLATELET
Abs Immature Granulocytes: 0 10*3/uL (ref 0.00–0.07)
Basophils Absolute: 0 10*3/uL (ref 0.0–0.1)
Basophils Relative: 0 %
Eosinophils Absolute: 0.4 10*3/uL (ref 0.0–0.5)
Eosinophils Relative: 8 %
HCT: 34.1 % — ABNORMAL LOW (ref 36.0–46.0)
Hemoglobin: 11.2 g/dL — ABNORMAL LOW (ref 12.0–15.0)
Immature Granulocytes: 0 %
Lymphocytes Relative: 31 %
Lymphs Abs: 1.5 10*3/uL (ref 0.7–4.0)
MCH: 32.5 pg (ref 26.0–34.0)
MCHC: 32.8 g/dL (ref 30.0–36.0)
MCV: 98.8 fL (ref 80.0–100.0)
Monocytes Absolute: 0.4 10*3/uL (ref 0.1–1.0)
Monocytes Relative: 8 %
Neutro Abs: 2.6 10*3/uL (ref 1.7–7.7)
Neutrophils Relative %: 53 %
Platelets: 196 10*3/uL (ref 150–400)
RBC: 3.45 MIL/uL — ABNORMAL LOW (ref 3.87–5.11)
RDW: 14.4 % (ref 11.5–15.5)
WBC: 4.9 10*3/uL (ref 4.0–10.5)
nRBC: 0 % (ref 0.0–0.2)

## 2022-10-17 LAB — FERRITIN: Ferritin: 47 ng/mL (ref 11–307)

## 2022-10-17 LAB — IRON AND TIBC
Iron: 82 ug/dL (ref 28–170)
Saturation Ratios: 22 % (ref 10.4–31.8)
TIBC: 372 ug/dL (ref 250–450)
UIBC: 290 ug/dL

## 2022-10-17 NOTE — Addendum Note (Signed)
Addended byJane Canary on: 10/17/2022 04:00 PM   Modules accepted: Orders

## 2022-10-17 NOTE — Progress Notes (Signed)
[place]  [phone] [phone]  ID: Emma Lewis OB: 1950/11/03  MR#: [id number]  [id number]  Patient Care Team: Donnamarie Rossetti, PA-C as PCP - General (Family Medicine)  REFERRING PHYSICIAN: Dr. Rush Landmark   REASON FOR REFERRAL: iron deficiency anemia   HPI: Emma Lewis is a 72 y.o. female with pmh of collagenous colitis follows with Dr. Dionicio Stall, HLD, hypothyroidism was referred to Hematology for management for IDA.  Patient reports history of diarrhea started in January 2023. She had colonoscopy and endoscopy with Dr. Rush Landmark on 12/26/2021 which showed erythematous mucosa in the stomach, grade 1 and grade 2 esophageal varices with no active bleeding was seen and multiple diverticuli.  H. pylori was negative.  Colon biopsies showed collagenous colitis.  She is following with GI for control of diarrhea and could not tolerate budesonide due to shortness of breath and swelling  She is on IBgard and has better control of her diarrhea.  She reports feeling fatigued.  She is intolerant to oral iron which causes diarrhea.  On review of the labs, patient has a longstanding history of iron deficiency anemia.  Labs from 02/2022 were reviewed which showed ferritin of 20 and iron saturation of 7%.  Hemoglobin is 9.7.  Completed iron infusions in September 2023.  Interval history Patient was seen today to discuss labs. Patient has chronic issues with the left hip and knee pain.  She is following with sports medicine and has a brace in place.  Her diarrhea is better controlled.  She is on Ibguard.  Rarely she notices blood on toilet paper.  Denies any bleeding in urine or stool.  Energy levels fluctuate depending on the weather.  When it is cold she has diffuse aches and feels tired.  REVIEW OF SYSTEMS:   Review of Systems  Gastrointestinal:  Positive for diarrhea.  Musculoskeletal:  Positive for joint pain.   As per HPI. Otherwise, a complete  review of systems is negative.  PAST MEDICAL HISTORY: Past Medical History:  Diagnosis Date   Allergies    Arthritis    "all over my body" (04/29/2018)   Chronic lower back pain    Fatty liver    Fever blister    Takes Acyclovir   GERD (gastroesophageal reflux disease)    Hyperlipemia    Hypothyroidism    Kidney stones    Migraines    Osteopenia    Pneumonia    "once; years ago" (04/29/2018)   PONV (postoperative nausea and vomiting)    "dry heaves"   Pre-diabetes    Seasonal allergies    Vertigo     PAST SURGICAL HISTORY: Past Surgical History:  Procedure Laterality Date   ANTERIOR CERVICAL DECOMP/DISCECTOMY FUSION N/A 02/09/2014   Procedure: ANTERIOR CERVICAL DECOMPRESSION/DISCECTOMY FUSION 2 LEVELS;  Surgeon: [name], MD;  Location: [facility];  Service: Orthopedics;  Laterality: N/A;  C4-5, C5-6 Anterior Cervical Discectomy and Fusion, Allograft, Plate   BACK SURGERY     CARPAL TUNNEL RELEASE Right 2018   CATARACT EXTRACTION W/ INTRAOCULAR LENS  IMPLANT, BILATERAL     COLONOSCOPY     FIXATION KYPHOPLASTY LUMBAR SPINE  X 2   HIP ARTHROPLASTY Left 2011   INCISION AND DRAINAGE HIP Left 06/19/2018   Procedure: IRRIGATION AND DEBRIDEMENT LEFT HIP HEMATOMA ANTIBIOTIC BEADS, APPLICATION ON WOUND VAC;  Surgeon: [name], MD;  Location: [facility];  Service: Orthopedics;  Laterality: Left;   JOINT REPLACEMENT     KNEE ARTHROSCOPY Right  ORIF PERIPROSTHETIC FRACTURE Left 06/01/2018   Procedure: OPEN REDUCTION INTERNAL FIXATION (ORIF) PERIPROSTHETIC FEMUR FRACTURE;  Surgeon: [name], MD;  Location: [facility];  Service: Orthopedics;  Laterality: Left;   PATELLA FRACTURE SURGERY Right ~ 1990   "put metal in"   PATELLA HARDWARE REMOVAL Right    "took the metal out"   [place] Left 04/29/2018   ROBOTIC ASSISTED BILATERAL SALPINGO OOPHERECTOMY Bilateral 08/01/2015   Procedure: ROBOTIC ASSISTED BILATERAL SALPINGO OOPHORECTOMY;  Surgeon: Everitt Amber, MD;   Location: [facility];  Service: Gynecology;  Laterality: Bilateral;   SHOULDER OPEN ROTATOR CUFF REPAIR Right    3 TOTAL   SHOULDER SURGERY Right    TOTAL HIP ARTHROPLASTY Right 01/21/2018   Procedure: RIGHT TOTAL HIP ARTHROPLASTY DIRECT ANTERIOR;  Surgeon: [name], MD;  Location: [facility];  Service: Orthopedics;  Laterality: Right;   TOTAL HIP REVISION Left 06/10/2016   Procedure: TOTAL HIP REVISION ARTHROPLASTY;  Surgeon: Rod Can, MD;  Location: [facility];  Service: Orthopedics;  Laterality: Left;   TOTAL HIP REVISION Left 04/29/2018   Procedure: left total hip arthroplasty revision posterior approach;  Surgeon: [name], MD;  Location: [facility];  Service: Orthopedics;  Laterality: Left;   TOTAL HIP REVISION Left 09/21/2018   Procedure: left total hip arthroplasty femoral revision;  Surgeon: [name], MD;  Location: [facility];  Service: Orthopedics;  Laterality: Left;   WRIST GANGLION EXCISION Left 1970s    FAMILY HISTORY: Family History  Problem Relation Age of Onset   Lymphoma Mother    Arthritis Mother    Hyperlipidemia Mother    Colon polyps Father    Diabetes Father    Cirrhosis Father    Hyperlipidemia Father    Colon cancer Neg Hx    Stomach cancer Neg Hx    Esophageal cancer Neg Hx    Inflammatory bowel disease Neg Hx    Liver disease Neg Hx    Pancreatic cancer Neg Hx    Rectal cancer Neg Hx     ADVANCED DIRECTIVES (Y/N):  N  HEALTH MAINTENANCE: Social History   Tobacco Use   Smoking status: Never   Smokeless tobacco: Never  Vaping Use   Vaping Use: Never used  Substance Use Topics   Alcohol use: Not Currently    Comment: `   Drug use: Never     Colonoscopy:  PAP:  Bone density:  Lipid panel:  Allergies  Allergen Reactions   Entocort Ec [Budesonide] Shortness Of Breath and Swelling   Sulfa Antibiotics Other (See Comments)     Childhood allergy Mother said "I liked to have died" UNSPECIFIED "SEVERE CHILDHOOD REACTION"    Current Outpatient  Medications  Medication Sig Dispense Refill   acyclovir (ZOVIRAX) 400 MG tablet Take 1 tablet (400 mg total) by mouth 2 (two) times daily. 60 tablet 0   AMBULATORY NON FORMULARY MEDICATION Nutrafol Take 4 capsule by mouth daily     atorvastatin (LIPITOR) 40 MG tablet Take 40 mg by mouth daily.     Calcium Carb-Cholecalciferol (CALCIUM 600 + D PO) Take 1 tablet by mouth 3 (three) times daily.      Cholecalciferol (VITAMIN D3) 2000 units TABS Take 2,000 Units by mouth 2 (two) times daily.      Coenzyme Q10 (CO Q10) 100 MG CAPS Take 1 capsule by mouth daily.     Cyanocobalamin (VITAMIN B-12) 2500 MCG SUBL Place 1 tablet under the tongue daily.     diphenoxylate-atropine (  LOMOTIL) 2.5-0.025 MG tablet TAKE 1-3 TABLETS BY MOUTH 4 TIMES DAILY AS NEEDED FOR  DIARRHEA  OR  LOOSE  STOOLS 30 tablet 0   ibuprofen (ADVIL) 200 MG tablet Take 800 mg by mouth 2 (two) times daily.     levothyroxine (EUTHYROX) 100 MCG tablet Take 100 mcg by mouth daily before breakfast.     Menaquinone-7 (VITAMIN K2) 100 MCG CAPS Take 1 capsule by mouth daily.     omeprazole (PRILOSEC OTC) 20 MG tablet Take 10 mg by mouth daily.     Peppermint Oil (IBGARD) 90 MG CPCR Take by mouth. Take 2 capsules by mouth in the mornings and 1-2 tablets at night     Turmeric Curcumin 500 MG CAPS Take 1 capsule by mouth 2 (two) times daily.     AMBULATORY NON FORMULARY MEDICATION Beet Root Take 2 capsule by mouth daily (Patient not taking: Reported on 10/17/2022)     AMBULATORY NON FORMULARY MEDICATION Aspercreme Patch 1 patch topical daily (Patient not taking: Reported on 10/17/2022)     B-D UF III MINI PEN NEEDLES 31G X 5 MM MISC Inject into the skin as directed. (Patient not taking: Reported on 10/17/2022)     nadolol (CORGARD) 20 MG tablet Take 1/2 tab (10 mg) for 1 week then increase to 1 tab (20 mg) daily (Patient not taking: Reported on 10/17/2022) 30 tablet 6   No current facility-administered medications for this visit.     OBJECTIVE: Vitals:   10/17/22 1520  BP: (!) 127/58  Pulse: 76  Resp: 16  Temp: 97.8 F (36.6 C)  SpO2: 100%     Body mass index is 29.81 kg/m.    ECOG FS:2 - Symptomatic, <50% confined to bed  General: Well-developed, well-nourished, no acute distress. Eyes: Pink conjunctiva, anicteric sclera. HEENT: Normocephalic, moist mucous membranes, clear oropharnyx. Lungs: Clear to auscultation bilaterally. Heart: Regular rate and rhythm. No rubs, murmurs, or gallops. Abdomen: Soft, nontender, nondistended. No organomegaly noted, normoactive bowel sounds. Musculoskeletal: No edema, cyanosis, or clubbing. Neuro: Alert, answering all questions appropriately. Cranial nerves grossly intact. Skin: No rashes or petechiae noted. Psych: Normal affect. Lymphatics: No cervical, calvicular, axillary or inguinal LAD.   LAB RESULTS:  Lab Results  Component Value Date   NA 140 03/05/2022   K 4.3 03/05/2022   CL 106 03/05/2022   CO2 26 03/05/2022   GLUCOSE 91 03/05/2022   BUN 14 03/05/2022   CREATININE 0.65 03/05/2022   CALCIUM 10.2 03/05/2022   PROT 7.1 03/05/2022   ALBUMIN 3.7 03/05/2022   AST 48 (H) 03/05/2022   ALT 24 03/05/2022   ALKPHOS 182 (H) 03/05/2022   BILITOT 0.6 03/05/2022   GFRNONAA >60 09/21/2018   GFRAA >60 09/21/2018    Lab Results  Component Value Date   WBC 4.9 10/17/2022   NEUTROABS 2.6 10/17/2022   HGB 11.2 (L) 10/17/2022   HCT 34.1 (L) 10/17/2022   MCV 98.8 10/17/2022   PLT 196 10/17/2022   Iron panel 03/06/2022   STUDIES: COLONOSCOPY 12/26/2021   ENDOSCOPY 12/26/2021   ASSESSMENT AND PLAN:   Emma Lewis is a 72 y.o. female with pmh of collagenous colitis, hypothyroidism and hyperlipidemia allergy clinic for further management of iron deficiency anemia  #Iron deficiency anemia -From 02/2022 ferritin of 20, saturation 7%.  Status post IV Feraheme x 2 in September 2023.  -Intolerant to oral iron due to diarrhea -Had colonoscopy and endoscopy in  March 2023 as above.  -CBC from today showed mild decrease  in the hemoglobin from 12.8-11.2.  Iron panel and B12 level are pending.  I discussed with the patient that there is a possibility her iron levels may be dropping down again.  I will follow-up on the labs and if the levels are low we will schedule her for Feraheme weekly x 2 doses.  I also discussed about consideration for capsule endoscopy.  If her levels come back low I will reach out to GI doctor.  # Chronic diarrhea # collagenous colitis  - follows with GI. Reports could not tolerate budesonide due to sob and swelling. Currently on Ibgard which is moderately controlling diarrhea.   RTC in 5 months for MD visit and labs. Patient expressed understanding and was in agreement with this plan. She also understands that She can call clinic at any time with any questions, concerns, or complaints.   I provided 25 minutes (3:56 PM - 3:56 PM) of face-to-face time during this this encounter and > 50% was spent counseling as documented under my assessment and plan.    Brizza Canary, MD   10/17/2022 3:56 PM

## 2022-10-18 LAB — VITAMIN B12: Vitamin B-12: 1341 pg/mL — ABNORMAL HIGH (ref 180–914)

## 2022-12-05 ENCOUNTER — Other Ambulatory Visit: Payer: Self-pay

## 2022-12-05 ENCOUNTER — Telehealth: Payer: Self-pay

## 2022-12-05 DIAGNOSIS — K745 Biliary cirrhosis, unspecified: Secondary | ICD-10-CM

## 2022-12-05 NOTE — Telephone Encounter (Signed)
Per Dr Meridee Score the pt is due for MRI MRCP abdomen w wo contrast.  Enter order and send to the schedulers to set up.  Let pt know she is due and will get a call.

## 2022-12-05 NOTE — Telephone Encounter (Signed)
-----   Message from Lucky Cowboy, RN sent at 12/05/2022  8:32 AM EDT -----  ----- Message ----- From: Lucky Cowboy, RN Sent: 12/04/2022   8:00 AM EDT To: Lucky Cowboy, RN  Patient needs 1 year follow up MRI. Refer to imaging result note 5/5.

## 2022-12-05 NOTE — Telephone Encounter (Signed)
Spoke to patient to let her know she is due for MRI MRCP and that she will receive a call from schedulers. Order placed

## 2022-12-06 DIAGNOSIS — E039 Hypothyroidism, unspecified: Secondary | ICD-10-CM | POA: Diagnosis not present

## 2022-12-17 DIAGNOSIS — N3946 Mixed incontinence: Secondary | ICD-10-CM | POA: Diagnosis not present

## 2022-12-20 ENCOUNTER — Other Ambulatory Visit: Payer: Self-pay | Admitting: Gastroenterology

## 2022-12-20 ENCOUNTER — Ambulatory Visit
Admission: RE | Admit: 2022-12-20 | Discharge: 2022-12-20 | Disposition: A | Discharge status: Home / Self Care | Payer: Medicare Other | Source: Ambulatory Visit | Attending: Gastroenterology | Admitting: Gastroenterology

## 2022-12-20 DIAGNOSIS — K745 Biliary cirrhosis, unspecified: Secondary | ICD-10-CM | POA: Insufficient documentation

## 2022-12-20 DIAGNOSIS — N281 Cyst of kidney, acquired: Secondary | ICD-10-CM | POA: Diagnosis not present

## 2022-12-20 DIAGNOSIS — K7460 Unspecified cirrhosis of liver: Secondary | ICD-10-CM | POA: Diagnosis not present

## 2022-12-20 DIAGNOSIS — R935 Abnormal findings on diagnostic imaging of other abdominal regions, including retroperitoneum: Secondary | ICD-10-CM | POA: Diagnosis not present

## 2022-12-20 MED ORDER — GADOBUTROL 1 MMOL/ML IV SOLN
7.0000 mL | Freq: Once | INTRAVENOUS | Status: AC | PRN
  Administered 2022-12-20: 7.5 mL via INTRAVENOUS

## 2022-12-27 ENCOUNTER — Telehealth: Payer: Self-pay | Admitting: Gastroenterology

## 2022-12-27 NOTE — Telephone Encounter (Signed)
See results note. 

## 2022-12-27 NOTE — Telephone Encounter (Signed)
Patient is returning your call.  

## 2023-01-07 ENCOUNTER — Telehealth: Payer: Self-pay

## 2023-01-07 DIAGNOSIS — N3941 Urge incontinence: Secondary | ICD-10-CM | POA: Diagnosis not present

## 2023-01-07 NOTE — Telephone Encounter (Signed)
Emma Lewis., MD 12/27/2022 11:15 AM EDT     Emma Lewis, Please let the patient know that I reviewed her MRI abdomen. From a liver cirrhosis standpoint things are stable.  And she will need a 73-month ultrasound for HCC screening with AFP and HFP and INR to be drawn. The adrenal gland lesion has slightly increased in size from prior and the radiologist now feel that it may warrant at least a surgical consultation, I agree with this so that decision can be made whether additional workup or monitoring or surveillance needs to be pursued. She has chronic compression fractures that are unchanged. No other changes in plan of action. Thanks. GM    The pt will be contacted in Nov.

## 2023-01-07 NOTE — Telephone Encounter (Signed)
-----   Message from Loretha Stapler, RN sent at 07/08/2022 11:11 AM EST ----- 38-month HCC screening with liver ultrasound.

## 2023-01-17 DIAGNOSIS — E039 Hypothyroidism, unspecified: Secondary | ICD-10-CM | POA: Diagnosis not present

## 2023-01-17 DIAGNOSIS — N3946 Mixed incontinence: Secondary | ICD-10-CM | POA: Diagnosis not present

## 2023-01-28 DIAGNOSIS — N3941 Urge incontinence: Secondary | ICD-10-CM | POA: Diagnosis not present

## 2023-02-07 DIAGNOSIS — R350 Frequency of micturition: Secondary | ICD-10-CM | POA: Diagnosis not present

## 2023-03-07 DIAGNOSIS — M25552 Pain in left hip: Secondary | ICD-10-CM | POA: Diagnosis not present

## 2023-03-07 DIAGNOSIS — G8929 Other chronic pain: Secondary | ICD-10-CM | POA: Diagnosis not present

## 2023-03-07 DIAGNOSIS — Z96642 Presence of left artificial hip joint: Secondary | ICD-10-CM | POA: Diagnosis not present

## 2023-03-13 DIAGNOSIS — E039 Hypothyroidism, unspecified: Secondary | ICD-10-CM | POA: Diagnosis not present

## 2023-03-13 DIAGNOSIS — M5136 Other intervertebral disc degeneration, lumbar region: Secondary | ICD-10-CM | POA: Diagnosis not present

## 2023-03-13 DIAGNOSIS — M47816 Spondylosis without myelopathy or radiculopathy, lumbar region: Secondary | ICD-10-CM | POA: Diagnosis not present

## 2023-03-13 DIAGNOSIS — M5416 Radiculopathy, lumbar region: Secondary | ICD-10-CM | POA: Diagnosis not present

## 2023-03-13 DIAGNOSIS — M549 Dorsalgia, unspecified: Secondary | ICD-10-CM | POA: Diagnosis not present

## 2023-03-13 DIAGNOSIS — M5116 Intervertebral disc disorders with radiculopathy, lumbar region: Secondary | ICD-10-CM | POA: Diagnosis not present

## 2023-03-13 DIAGNOSIS — M8588 Other specified disorders of bone density and structure, other site: Secondary | ICD-10-CM | POA: Diagnosis not present

## 2023-03-14 ENCOUNTER — Telehealth: Payer: Self-pay

## 2023-03-14 NOTE — Patient Outreach (Signed)
  Care Coordination   03/14/2023 Name: Emma Lewis MRN: [id number] DOB: 05-02-1951   Care Coordination Outreach Attempts:  An unsuccessful telephone outreach was attempted today to offer the patient information about available care coordination services.  Follow Up Plan:  Additional outreach attempts will be made to offer the patient care coordination information and services.   Encounter Outcome:  No Answer   Care Coordination Interventions:  No, not indicated    .acs

## 2023-03-17 ENCOUNTER — Other Ambulatory Visit: Payer: Self-pay | Admitting: Family Medicine

## 2023-03-17 DIAGNOSIS — M5416 Radiculopathy, lumbar region: Secondary | ICD-10-CM

## 2023-03-20 ENCOUNTER — Inpatient Hospital Stay: Payer: Medicare Other

## 2023-03-20 ENCOUNTER — Inpatient Hospital Stay: Payer: Medicare Other | Attending: Internal Medicine

## 2023-03-20 ENCOUNTER — Inpatient Hospital Stay (HOSPITAL_BASED_OUTPATIENT_CLINIC_OR_DEPARTMENT_OTHER): Payer: Medicare Other | Admitting: Internal Medicine

## 2023-03-20 VITALS — BP 134/71 | HR 84 | Temp 97.3°F

## 2023-03-20 VITALS — BP 131/68 | HR 74 | Temp 98.5°F | Wt 173.0 lb

## 2023-03-20 DIAGNOSIS — D508 Other iron deficiency anemias: Secondary | ICD-10-CM | POA: Diagnosis not present

## 2023-03-20 DIAGNOSIS — D500 Iron deficiency anemia secondary to blood loss (chronic): Secondary | ICD-10-CM

## 2023-03-20 DIAGNOSIS — K52831 Collagenous colitis: Secondary | ICD-10-CM | POA: Diagnosis not present

## 2023-03-20 DIAGNOSIS — D649 Anemia, unspecified: Secondary | ICD-10-CM

## 2023-03-20 LAB — CBC WITH DIFFERENTIAL (CANCER CENTER ONLY)
Abs Immature Granulocytes: 0.01 10*3/uL (ref 0.00–0.07)
Basophils Absolute: 0 10*3/uL (ref 0.0–0.1)
Basophils Relative: 1 %
Eosinophils Absolute: 0.1 10*3/uL (ref 0.0–0.5)
Eosinophils Relative: 3 %
HCT: 31.2 % — ABNORMAL LOW (ref 36.0–46.0)
Hemoglobin: 10.2 g/dL — ABNORMAL LOW (ref 12.0–15.0)
Immature Granulocytes: 0 %
Lymphocytes Relative: 32 %
Lymphs Abs: 1.5 10*3/uL (ref 0.7–4.0)
MCH: 30.1 pg (ref 26.0–34.0)
MCHC: 32.7 g/dL (ref 30.0–36.0)
MCV: 92 fL (ref 80.0–100.0)
Monocytes Absolute: 0.6 10*3/uL (ref 0.1–1.0)
Monocytes Relative: 12 %
Neutro Abs: 2.4 10*3/uL (ref 1.7–7.7)
Neutrophils Relative %: 52 %
Platelet Count: 164 10*3/uL (ref 150–400)
RBC: 3.39 MIL/uL — ABNORMAL LOW (ref 3.87–5.11)
RDW: 17.1 % — ABNORMAL HIGH (ref 11.5–15.5)
WBC Count: 4.6 10*3/uL (ref 4.0–10.5)
nRBC: 0 % (ref 0.0–0.2)

## 2023-03-20 LAB — IRON AND TIBC
Iron: 73 ug/dL (ref 28–170)
Saturation Ratios: 15 % (ref 10.4–31.8)
TIBC: 480 ug/dL — ABNORMAL HIGH (ref 250–450)
UIBC: 407 ug/dL

## 2023-03-20 LAB — FERRITIN: Ferritin: 25 ng/mL (ref 11–307)

## 2023-03-20 MED ORDER — SODIUM CHLORIDE 0.9 % IV SOLN
510.0000 mg | Freq: Once | INTRAVENOUS | Status: AC
  Administered 2023-03-20: 510 mg via INTRAVENOUS
  Filled 2023-03-20: qty 510

## 2023-03-20 MED ORDER — SODIUM CHLORIDE 0.9 % IV SOLN
Freq: Once | INTRAVENOUS | Status: AC
  Filled 2023-03-20: qty 250

## 2023-03-20 NOTE — Progress Notes (Signed)
Patient says that other than her knee pain no new questions or concerns for Dr. Alena Bills.

## 2023-03-20 NOTE — Patient Instructions (Signed)
 Ferumoxytol Injection What is this medication? FERUMOXYTOL (FER ue MOX i tol) treats low levels of iron in your body (iron deficiency anemia). Iron is a mineral that plays an important role in making red blood cells, which carry oxygen from your lungs to the rest of your body. This medicine may be used for other purposes; ask your health care provider or pharmacist if you have questions. COMMON BRAND NAME(S): Feraheme What should I tell my care team before I take this medication? They need to know if you have any of these conditions: Anemia not caused by low iron levels High levels of iron in the blood Magnetic resonance imaging (MRI) test scheduled An unusual or allergic reaction to iron, other medications, foods, dyes, or preservatives Pregnant or trying to get pregnant Breastfeeding How should I use this medication? This medication is injected into a vein. It is given by your care team in a hospital or clinic setting. Talk to your care team the use of this medication in children. Special care may be needed. Overdosage: If you think you have taken too much of this medicine contact a poison control center or emergency room at once. NOTE: This medicine is only for you. Do not share this medicine with others. What if I miss a dose? It is important not to miss your dose. Call your care team if you are unable to keep an appointment. What may interact with this medication? Other iron products This list may not describe all possible interactions. Give your health care provider a list of all the medicines, herbs, non-prescription drugs, or dietary supplements you use. Also tell them if you smoke, drink alcohol, or use illegal drugs. Some items may interact with your medicine. What should I watch for while using this medication? Visit your care team regularly. Tell your care team if your symptoms do not start to get better or if they get worse. You may need blood work done while you are taking this  medication. You may need to follow a special diet. Talk to your care team. Foods that contain iron include: whole grains/cereals, dried fruits, beans, or peas, leafy green vegetables, and organ meats (liver, kidney). What side effects may I notice from receiving this medication? Side effects that you should report to your care team as soon as possible: Allergic reactions--skin rash, itching, hives, swelling of the face, lips, tongue, or throat Low blood pressure--dizziness, feeling faint or lightheaded, blurry vision Shortness of breath Side effects that usually do not require medical attention (report to your care team if they continue or are bothersome): Flushing Headache Joint pain Muscle pain Nausea Pain, redness, or irritation at injection site This list may not describe all possible side effects. Call your doctor for medical advice about side effects. You may report side effects to FDA at 1-800-FDA-1088. Where should I keep my medication? This medication is given in a hospital or clinic. It will not be stored at home. NOTE: This sheet is a summary. It may not cover all possible information. If you have questions about this medicine, talk to your doctor, pharmacist, or health care provider.  2024 Elsevier/Gold Standard (2022-12-20 00:00:00)

## 2023-03-21 DIAGNOSIS — M170 Bilateral primary osteoarthritis of knee: Secondary | ICD-10-CM | POA: Diagnosis not present

## 2023-03-21 DIAGNOSIS — M25561 Pain in right knee: Secondary | ICD-10-CM | POA: Diagnosis not present

## 2023-03-21 DIAGNOSIS — M25562 Pain in left knee: Secondary | ICD-10-CM | POA: Diagnosis not present

## 2023-03-21 LAB — VITAMIN B12: Vitamin B-12: 2310 pg/mL — ABNORMAL HIGH (ref 180–914)

## 2023-03-25 ENCOUNTER — Encounter: Payer: Self-pay | Admitting: Internal Medicine

## 2023-03-25 ENCOUNTER — Telehealth: Payer: Self-pay

## 2023-03-25 DIAGNOSIS — Z8639 Personal history of other endocrine, nutritional and metabolic disease: Secondary | ICD-10-CM

## 2023-03-25 NOTE — Progress Notes (Signed)
[facility]  [phone] [phone]  ID: Emma Lewis OB: May 03, 1951  MR#: [id number]  QIH#:[id number]  Patient Care Team: Wilford Corner, PA-C as PCP - General (Family Medicine) Michaelyn Barter, MD as Consulting Physician (Oncology)  REFERRING PHYSICIAN: Dr. Meridee Score   REASON FOR REFERRAL: iron deficiency anemia   HPI: Emma Lewis is a 72 y.o. female with pmh of collagenous colitis follows with Dr. Rhetta Mura, HLD, hypothyroidism was referred to Hematology for management for IDA.   Patient reports history of diarrhea started in January 2023. She had colonoscopy and endoscopy with Dr. Meridee Score on 12/26/2021 which showed erythematous mucosa in the stomach, grade 1 and grade 2 esophageal varices with no active bleeding was seen and multiple diverticuli.  H. pylori was negative.  Colon biopsies showed collagenous colitis.  She is following with GI for control of diarrhea and could not tolerate budesonide due to shortness of breath and swelling  She is on IBgard and has better control of her diarrhea.  She reports feeling fatigued.  She is intolerant to oral iron which causes diarrhea.  On review of the labs, patient has a longstanding history of iron deficiency anemia.  Labs from 02/2022 were reviewed which showed ferritin of 20 and iron saturation of 7%.  Hemoglobin is 9.7.  Received IV Feraheme x 2 doses in September 2023  Interval history Patient seen today as follow-up for IDA and labs. She continues to have issues with the left knee pain and is following with orthopedics.  Otherwise she is doing well.  Denies any bleeding in urine or stool.  REVIEW OF SYSTEMS:   Review of Systems  Gastrointestinal:  Positive for diarrhea.  Musculoskeletal:  Positive for joint pain.   As per HPI. Otherwise, a complete review of systems is negative.  PAST MEDICAL HISTORY: Past Medical History:  Diagnosis Date   Allergies    Arthritis    "all over my  body" (04/29/2018)   Chronic lower back pain    Fatty liver    Fever blister    Takes Acyclovir   GERD (gastroesophageal reflux disease)    Hyperlipemia    Hypothyroidism    Kidney stones    Migraines    Osteopenia    Pneumonia    "once; years ago" (04/29/2018)   PONV (postoperative nausea and vomiting)    "dry heaves"   Pre-diabetes    Seasonal allergies    Vertigo     PAST SURGICAL HISTORY: Past Surgical History:  Procedure Laterality Date   ANTERIOR CERVICAL DECOMP/DISCECTOMY FUSION N/A 02/09/2014   Procedure: ANTERIOR CERVICAL DECOMPRESSION/DISCECTOMY FUSION 2 LEVELS;  Surgeon: Eldred Manges, MD;  Location: [facility] OR;  Service: Orthopedics;  Laterality: N/A;  C4-5, C5-6 Anterior Cervical Discectomy and Fusion, Allograft, Plate   BACK SURGERY     CARPAL TUNNEL RELEASE Right 2018   CATARACT EXTRACTION W/ INTRAOCULAR LENS  IMPLANT, BILATERAL     COLONOSCOPY     FIXATION KYPHOPLASTY LUMBAR SPINE  X 2   HIP ARTHROPLASTY Left 2011   INCISION AND DRAINAGE HIP Left 06/19/2018   Procedure: IRRIGATION AND DEBRIDEMENT LEFT HIP HEMATOMA ANTIBIOTIC BEADS, APPLICATION ON WOUND VAC;  Surgeon: Eldred Manges, MD;  Location: [facility] OR;  Service: Orthopedics;  Laterality: Left;   JOINT REPLACEMENT     KNEE ARTHROSCOPY Right    ORIF PERIPROSTHETIC FRACTURE Left 06/01/2018   Procedure: OPEN REDUCTION INTERNAL FIXATION (ORIF) PERIPROSTHETIC FEMUR FRACTURE;  Surgeon: Eldred Manges, MD;  Location: [facility] OR;  Service: Orthopedics;  Laterality: Left;   PATELLA FRACTURE SURGERY Right ~ 1990   "put metal in"   PATELLA HARDWARE REMOVAL Right    "took the metal out"   REVISION TOTAL HIP ARTHROPLASTY Left 04/29/2018   ROBOTIC ASSISTED BILATERAL SALPINGO OOPHERECTOMY Bilateral 08/01/2015   Procedure: ROBOTIC ASSISTED BILATERAL SALPINGO OOPHORECTOMY;  Surgeon: Adolphus Birchwood, MD;  Location: [facility] OR[facility];  Service: Gynecology;  Laterality: Bilateral;   SHOULDER OPEN ROTATOR CUFF REPAIR Right    3 TOTAL   SHOULDER SURGERY Right     TOTAL HIP ARTHROPLASTY Right 01/21/2018   Procedure: RIGHT TOTAL HIP ARTHROPLASTY DIRECT ANTERIOR;  Surgeon: Eldred Manges, MD;  Location: [facility] OR;  Service: Orthopedics;  Laterality: Right;   TOTAL HIP REVISION Left 06/10/2016   Procedure: TOTAL HIP REVISION ARTHROPLASTY;  Surgeon: Samson Frederic, MD;  Location: [facility] OR;  Service: Orthopedics;  Laterality: Left;   TOTAL HIP REVISION Left 04/29/2018   Procedure: left total hip arthroplasty revision posterior approach;  Surgeon: Eldred Manges, MD;  Location: [facility] OR;  Service: Orthopedics;  Laterality: Left;   TOTAL HIP REVISION Left 09/21/2018   Procedure: left total hip arthroplasty femoral revision;  Surgeon: Eldred Manges, MD;  Location: [facility] OR;  Service: Orthopedics;  Laterality: Left;   WRIST GANGLION EXCISION Left 1970s    FAMILY HISTORY: Family History  Problem Relation Age of Onset   Lymphoma Mother    Arthritis Mother    Hyperlipidemia Mother    Colon polyps Father    Diabetes Father    Cirrhosis Father    Hyperlipidemia Father    Colon cancer Neg Hx    Stomach cancer Neg Hx    Esophageal cancer Neg Hx    Inflammatory bowel disease Neg Hx    Liver disease Neg Hx    Pancreatic cancer Neg Hx    Rectal cancer Neg Hx     ADVANCED DIRECTIVES (Y/N):  N  HEALTH MAINTENANCE: Social History   Tobacco Use   Smoking status: Never   Smokeless tobacco: Never  Vaping Use   Vaping status: Never Used  Substance Use Topics   Alcohol use: Not Currently    Comment: `   Drug use: Never     Colonoscopy:  PAP:  Bone density:  Lipid panel:  Allergies  Allergen Reactions   Entocort Ec [Budesonide] Shortness Of Breath and Swelling   Sulfa Antibiotics Other (See Comments)     Childhood allergy Mother said "I liked to have died" UNSPECIFIED "SEVERE CHILDHOOD REACTION"    Current Outpatient Medications  Medication Sig Dispense Refill   acyclovir (ZOVIRAX) 400 MG tablet Take 1 tablet (400 mg total) by mouth 2 (two) times daily. 60  tablet 0   AMBULATORY NON FORMULARY MEDICATION Nutrafol Take 4 capsule by mouth daily     AMBULATORY NON FORMULARY MEDICATION Aspercreme Patch 1 patch topical daily     atorvastatin (LIPITOR) 40 MG tablet Take 40 mg by mouth daily.     Calcium Carb-Cholecalciferol (CALCIUM 600 + D PO) Take 1 tablet by mouth 3 (three) times daily.      Cholecalciferol (VITAMIN D3) 2000 units TABS Take 2,000 Units by mouth 2 (two) times daily.      Coenzyme Q10 (CO Q10) 100 MG CAPS Take 1 capsule by mouth daily.     Cyanocobalamin (VITAMIN B-12) 2500 MCG SUBL Place 1 tablet under the tongue daily.     ibuprofen (ADVIL) 200 MG tablet Take 800 mg by mouth 2 (two) times daily.  levothyroxine (EUTHYROX) 100 MCG tablet Take 75 mcg by mouth daily before breakfast.     Menaquinone-7 (VITAMIN K2) 100 MCG CAPS Take 1 capsule by mouth daily.     omeprazole (PRILOSEC OTC) 20 MG tablet Take 10 mg by mouth daily.     Peppermint Oil (IBGARD) 90 MG CPCR Take by mouth. Take 2 capsules by mouth in the mornings and 1-2 tablets at night     Turmeric Curcumin 500 MG CAPS Take 1 capsule by mouth 2 (two) times daily.     AMBULATORY NON FORMULARY MEDICATION Beet Root Take 2 capsule by mouth daily (Patient not taking: Reported on 10/17/2022)     B-D UF III MINI PEN NEEDLES 31G X 5 MM MISC Inject into the skin as directed. (Patient not taking: Reported on 10/17/2022)     diphenoxylate-atropine (LOMOTIL) 2.5-0.025 MG tablet TAKE 1-3 TABLETS BY MOUTH 4 TIMES DAILY AS NEEDED FOR  DIARRHEA  OR  LOOSE  STOOLS (Patient not taking: Reported on 03/20/2023) 30 tablet 0   nadolol (CORGARD) 20 MG tablet Take 1/2 tab (10 mg) for 1 week then increase to 1 tab (20 mg) daily (Patient not taking: Reported on 10/17/2022) 30 tablet 6   No current facility-administered medications for this visit.    OBJECTIVE: Vitals:   03/20/23 1354  BP: 131/68  Pulse: 74  Temp: 98.5 F (36.9 C)  SpO2: 99%     Body mass index is 31.64 kg/m.    ECOG FS:2 -  Symptomatic, <50% confined to bed  General: Well-developed, well-nourished, no acute distress. Eyes: Pink conjunctiva, anicteric sclera. HEENT: Normocephalic, moist mucous membranes, clear oropharnyx. Lungs: Clear to auscultation bilaterally. Heart: Regular rate and rhythm. No rubs, murmurs, or gallops. Abdomen: Soft, nontender, nondistended. No organomegaly noted, normoactive bowel sounds. Musculoskeletal: No edema, cyanosis, or clubbing. Neuro: Alert, answering all questions appropriately. Cranial nerves grossly intact. Skin: No rashes or petechiae noted. Psych: Normal affect. Lymphatics: No cervical, calvicular, axillary or inguinal LAD.   LAB RESULTS:  Lab Results  Component Value Date   NA 140 03/05/2022   K 4.3 03/05/2022   CL 106 03/05/2022   CO2 26 03/05/2022   GLUCOSE 91 03/05/2022   BUN 14 03/05/2022   CREATININE 0.65 03/05/2022   CALCIUM 10.2 03/05/2022   PROT 7.1 03/05/2022   ALBUMIN 3.7 03/05/2022   AST 48 (H) 03/05/2022   ALT 24 03/05/2022   ALKPHOS 182 (H) 03/05/2022   BILITOT 0.6 03/05/2022   GFRNONAA >60 09/21/2018   GFRAA >60 09/21/2018    Lab Results  Component Value Date   WBC 4.6 03/20/2023   NEUTROABS 2.4 03/20/2023   HGB 10.2 (L) 03/20/2023   HCT 31.2 (L) 03/20/2023   MCV 92.0 03/20/2023   PLT 164 03/20/2023   Iron panel 03/06/2022   STUDIES: COLONOSCOPY 12/26/2021   ENDOSCOPY 12/26/2021   ASSESSMENT AND PLAN:   Emma Lewis is a 72 y.o. female with pmh of collagenous colitis, hypothyroidism and hyperlipidemia allergy clinic for further management of iron deficiency anemia  #Iron deficiency anemia -From 02/2022 ferritin of 20, saturation 7%.  Status post IV Feraheme x 2 in September 2023.  -Intolerant to oral iron due to diarrhea -Had colonoscopy and endoscopy in March 2023 as above. -CBC showing slow downtrend in the hemoglobin from 11.2-10.2.  Iron panel shows slow downtrend in saturation from 22 to 15%.  Ferritin from 47-25.  Will  proceed with IV Feraheme today.  Will schedule for second dose next week.  I  have sent an update to Dr. Meridee Score if he needs to plan for small bowel endoscopy.  # Chronic diarrhea # collagenous colitis  - follows with GI. Reports could not tolerate budesonide due to sob and swelling. Currently on Ibgard which is moderately controlling diarrhea.   RTC in 5 months for MD visit, labs, possible Feraheme  Patient expressed understanding and was in agreement with this plan. She also understands that She can call clinic at any time with any questions, concerns, or complaints.   I provided 25 minutes (12:57 PM - 12:57 PM) of face-to-face time during this this encounter and > 50% was spent counseling as documented under my assessment and plan.    Michaelyn Barter, MD   03/25/2023 12:57 PM

## 2023-03-25 NOTE — Telephone Encounter (Signed)
-----   Message from [facility] sent at 03/25/2023  4:13 PM EDT ----- Regarding: RE: Update- IDA KA, Thanks for reaching out. This sounds very reasonable. I will have my team reach out and see if she is willing to move forward with this.  Emma Lewis, This patient of mine has history of cirrhosis but looks to have progressive iron deficiency.  We performed upper and lower endoscopy at last year.  No overt lesions noted that could define iron deficiency at this point.  Can we offer the patient a video capsule endoscopy as an outpatient and schedule that as able, if she agrees?   Please update Dr. Michae Kava and myself if she does agree to move forward with a capsule. Thanks. GM ----- Message ----- From: Michaelyn Barter, MD Sent: 03/25/2023   1:03 PM EDT To: Lemar Lofty., MD Subject: Update- IDA                                    Hello Dr. Meridee Score,   I have been following Emma Lewis for IDA.  Most recently her hemoglobin has trended down to 10.2.  Her iron panel does not look terrible but has been trending down.  Her ferritin is down from 47 to 25 and saturation is down from 22 to 15%.  I am going to give her 2 doses of Feraheme.   I wanted to update you in case you plan for small capsule endoscopy.  Thank you  Michaelyn Barter Hematology oncology.

## 2023-03-25 NOTE — Telephone Encounter (Signed)
Capsule endo

## 2023-03-27 NOTE — Telephone Encounter (Signed)
Capsule endo has been set up for 04/21/23 at 8:30 am. Instructions have been sent to the pt My Chart and regular mail.  Referral entered for insurance.  Capsule schedule form completed and put in the capsule room folder.    Left message on machine to call back

## 2023-03-28 DIAGNOSIS — N3946 Mixed incontinence: Secondary | ICD-10-CM | POA: Diagnosis not present

## 2023-03-28 NOTE — Telephone Encounter (Signed)
Left message on machine to call back  

## 2023-04-01 NOTE — Telephone Encounter (Signed)
The pt has been advised of the capsule appt. She is aware that all information has also been mailed to her and sent to My Chart.   She has requested for the appt to be on 9/13. I have moved this appt for her.

## 2023-04-04 ENCOUNTER — Inpatient Hospital Stay: Payer: Medicare Other | Attending: Internal Medicine

## 2023-04-04 VITALS — BP 134/68 | HR 82 | Temp 97.6°F | Resp 18

## 2023-04-04 DIAGNOSIS — D508 Other iron deficiency anemias: Secondary | ICD-10-CM | POA: Diagnosis not present

## 2023-04-04 DIAGNOSIS — K52831 Collagenous colitis: Secondary | ICD-10-CM | POA: Diagnosis not present

## 2023-04-04 MED ORDER — SODIUM CHLORIDE 0.9 % IV SOLN
INTRAVENOUS | Status: DC
  Filled 2023-04-04: qty 250

## 2023-04-04 MED ORDER — SODIUM CHLORIDE 0.9 % IV SOLN
510.0000 mg | Freq: Once | INTRAVENOUS | Status: AC
  Administered 2023-04-04: 510 mg via INTRAVENOUS
  Filled 2023-04-04: qty 17

## 2023-04-04 NOTE — Patient Instructions (Addendum)
 Ferumoxytol Injection What is this medication? FERUMOXYTOL (FER ue MOX i tol) treats low levels of iron in your body (iron deficiency anemia). Iron is a mineral that plays an important role in making red blood cells, which carry oxygen from your lungs to the rest of your body. This medicine may be used for other purposes; ask your health care provider or pharmacist if you have questions. COMMON BRAND NAME(S): Feraheme What should I tell my care team before I take this medication? They need to know if you have any of these conditions: Anemia not caused by low iron levels High levels of iron in the blood Magnetic resonance imaging (MRI) test scheduled An unusual or allergic reaction to iron, other medications, foods, dyes, or preservatives Pregnant or trying to get pregnant Breastfeeding How should I use this medication? This medication is injected into a vein. It is given by your care team in a hospital or clinic setting. Talk to your care team the use of this medication in children. Special care may be needed. Overdosage: If you think you have taken too much of this medicine contact a poison control center or emergency room at once. NOTE: This medicine is only for you. Do not share this medicine with others. What if I miss a dose? It is important not to miss your dose. Call your care team if you are unable to keep an appointment. What may interact with this medication? Other iron products This list may not describe all possible interactions. Give your health care provider a list of all the medicines, herbs, non-prescription drugs, or dietary supplements you use. Also tell them if you smoke, drink alcohol, or use illegal drugs. Some items may interact with your medicine. What should I watch for while using this medication? Visit your care team regularly. Tell your care team if your symptoms do not start to get better or if they get worse. You may need blood work done while you are taking this  medication. You may need to follow a special diet. Talk to your care team. Foods that contain iron include: whole grains/cereals, dried fruits, beans, or peas, leafy green vegetables, and organ meats (liver, kidney). What side effects may I notice from receiving this medication? Side effects that you should report to your care team as soon as possible: Allergic reactions--skin rash, itching, hives, swelling of the face, lips, tongue, or throat Low blood pressure--dizziness, feeling faint or lightheaded, blurry vision Shortness of breath Side effects that usually do not require medical attention (report to your care team if they continue or are bothersome): Flushing Headache Joint pain Muscle pain Nausea Pain, redness, or irritation at injection site This list may not describe all possible side effects. Call your doctor for medical advice about side effects. You may report side effects to FDA at 1-800-FDA-1088. Where should I keep my medication? This medication is given in a hospital or clinic. It will not be stored at home. NOTE: This sheet is a summary. It may not cover all possible information. If you have questions about this medicine, talk to your doctor, pharmacist, or health care provider.  2024 Elsevier/Gold Standard (2022-12-20 00:00:00)

## 2023-04-07 NOTE — Telephone Encounter (Signed)
Inbound call from patient in regards to previous note. Please advise. 

## 2023-04-08 NOTE — Telephone Encounter (Signed)
The pt has questions regarding the amount of miralax needed for the capsule endo. The instructions state that she is to purchase 238 grams of miralax but is only to drink 7 capfuls.  She has been advised to drink the 7 capfuls only.  She will call back if she has any further questions.

## 2023-04-11 ENCOUNTER — Ambulatory Visit (INDEPENDENT_AMBULATORY_CARE_PROVIDER_SITE_OTHER): Payer: Medicare Other | Admitting: Gastroenterology

## 2023-04-11 DIAGNOSIS — D509 Iron deficiency anemia, unspecified: Secondary | ICD-10-CM

## 2023-04-11 DIAGNOSIS — Z8639 Personal history of other endocrine, nutritional and metabolic disease: Secondary | ICD-10-CM

## 2023-04-11 NOTE — Patient Instructions (Addendum)
You may have clear liquids beginning at 10:30 am after ingesting the capsule.    You can have a light lunch at 12:30 pm; sandwich and half bowl of soup.  Return to the office at 4 pm to return the equipment.   Return to you normal diet at 5 pm.   Call [phone] and ask for Glendora Score, RN if you have any questions.  You should pass the capsule in your stool 8-48 hours after ingestion. If you have not passed the capsule, after 72 hours, please contact the office at [phone].

## 2023-04-11 NOTE — Progress Notes (Signed)
SN: [id number]  Exp: 2023-09-12 LOT: [id number]  Patient arrived for VCE. Reported the prep went well. This RN explained capsule dietary restrictions for the next few hours. Pt advised to return at 4 pm to return capsule equipment.  Patient verbalized understanding. Opened capsule, ensured capsule was flashing prior to the patient swallowing the capsule. Patient swallowed capsule without difficulty.  Patient told to call the office with any questions and if capsule has not passed after 72 hours. No further questions by the conclusion of the visit.

## 2023-04-18 DIAGNOSIS — M1711 Unilateral primary osteoarthritis, right knee: Secondary | ICD-10-CM | POA: Diagnosis not present

## 2023-04-25 ENCOUNTER — Ambulatory Visit
Admission: RE | Admit: 2023-04-25 | Discharge: 2023-04-25 | Disposition: A | Discharge status: Home / Self Care | Payer: Medicare Other | Source: Ambulatory Visit | Attending: Family Medicine | Admitting: Family Medicine

## 2023-04-25 ENCOUNTER — Encounter: Payer: Self-pay | Admitting: Internal Medicine

## 2023-04-25 DIAGNOSIS — M5416 Radiculopathy, lumbar region: Secondary | ICD-10-CM

## 2023-04-25 DIAGNOSIS — M1711 Unilateral primary osteoarthritis, right knee: Secondary | ICD-10-CM | POA: Diagnosis not present

## 2023-04-28 ENCOUNTER — Telehealth: Payer: Self-pay

## 2023-04-28 NOTE — Telephone Encounter (Signed)
-----   Message from [facility] sent at 04/28/2023  4:16 PM EDT ----- Regarding: RE: Capsule AE, Thanks for reading the capsule. Agree that it makes sense that the PHG may be playing her reasoning for recurrent iron deficiency. At this point not much else that we can do other than support her with intravenous iron. I will forward this to Alexia Freestone so she can talk with the patient.  Tiffiney Sparrow, Please let her know that once we get a copy of the capsule into the system, she will get a copy that will be mailed to her.  There are no significant findings that are a reason in the small bowel for her previous iron deficiency.  It is felt that portal gastropathy from her underlying cirrhosis is likely playing a role.  She can only be supported at this point.  No role for another endoscopic evaluation as of this time.  We can consider repeat upper endoscopy if things get more significant however to make sure that nothing is actively oozing or bleeding. Thanks. GM  FYI KA on results of our video capsule endoscopy.  Appreciate you supporting her with iron as you can and working her up for any other issues or reasons. GM ----- Message ----- From: Sammuel Cooper, PA-C Sent: 04/28/2023   1:22 PM EDT To: Lemar Lofty., MD Subject: Emma Lewis - Capsule endoscopy that you ordered on this patient for iron deficiency anemia in setting of cirrhosis, was completely negative other than to very small aphthous appearing ulcers at least an hour distal to the first duodenal image.  These are felt to be nonspecific  Suspect that her iron deficiency anemia is secondary to portal gastropathy documented on prior EGD May 2023  Report has been scanned into epic

## 2023-04-29 NOTE — Telephone Encounter (Signed)
The pt has been advised and agrees to keep [place] posted if there are any changes in her condition.  She will be mailed a copy of the capsule when able.

## 2023-05-02 DIAGNOSIS — E039 Hypothyroidism, unspecified: Secondary | ICD-10-CM | POA: Diagnosis not present

## 2023-05-02 DIAGNOSIS — M1711 Unilateral primary osteoarthritis, right knee: Secondary | ICD-10-CM | POA: Diagnosis not present

## 2023-05-07 ENCOUNTER — Encounter: Payer: Self-pay | Admitting: Gastroenterology

## 2023-05-23 DIAGNOSIS — M5416 Radiculopathy, lumbar region: Secondary | ICD-10-CM | POA: Diagnosis not present

## 2023-05-23 DIAGNOSIS — M4856XD Collapsed vertebra, not elsewhere classified, lumbar region, subsequent encounter for fracture with routine healing: Secondary | ICD-10-CM | POA: Diagnosis not present

## 2023-06-03 DIAGNOSIS — M48062 Spinal stenosis, lumbar region with neurogenic claudication: Secondary | ICD-10-CM | POA: Diagnosis not present

## 2023-06-03 DIAGNOSIS — M5416 Radiculopathy, lumbar region: Secondary | ICD-10-CM | POA: Diagnosis not present

## 2023-06-30 ENCOUNTER — Telehealth: Payer: Self-pay

## 2023-06-30 DIAGNOSIS — K745 Biliary cirrhosis, unspecified: Secondary | ICD-10-CM

## 2023-06-30 DIAGNOSIS — R932 Abnormal findings on diagnostic imaging of liver and biliary tract: Secondary | ICD-10-CM

## 2023-06-30 NOTE — Telephone Encounter (Signed)
The pt has been advised and agrees to the [place] and labs. Order has been entered and sent to the schedulers for [place].

## 2023-06-30 NOTE — Telephone Encounter (Signed)
-----   Message from Nurse Lilian Fuhs P sent at 12/27/2022 11:18 AM EDT ----- 59-month ultrasound for [facility] screening with AFP and HFP and INR to be drawn.

## 2023-07-04 DIAGNOSIS — M48062 Spinal stenosis, lumbar region with neurogenic claudication: Secondary | ICD-10-CM | POA: Diagnosis not present

## 2023-07-04 DIAGNOSIS — M5416 Radiculopathy, lumbar region: Secondary | ICD-10-CM | POA: Diagnosis not present

## 2023-07-18 ENCOUNTER — Other Ambulatory Visit (INDEPENDENT_AMBULATORY_CARE_PROVIDER_SITE_OTHER): Payer: Medicare Other

## 2023-07-18 DIAGNOSIS — K745 Biliary cirrhosis, unspecified: Secondary | ICD-10-CM | POA: Diagnosis not present

## 2023-07-18 DIAGNOSIS — R932 Abnormal findings on diagnostic imaging of liver and biliary tract: Secondary | ICD-10-CM | POA: Diagnosis not present

## 2023-07-18 DIAGNOSIS — R310 Gross hematuria: Secondary | ICD-10-CM | POA: Diagnosis not present

## 2023-07-18 DIAGNOSIS — N3946 Mixed incontinence: Secondary | ICD-10-CM | POA: Diagnosis not present

## 2023-07-18 LAB — HEPATIC FUNCTION PANEL
ALT: 29 U/L (ref 0–35)
AST: 53 U/L — ABNORMAL HIGH (ref 0–37)
Albumin: 3.3 g/dL — ABNORMAL LOW (ref 3.5–5.2)
Alkaline Phosphatase: 215 U/L — ABNORMAL HIGH (ref 39–117)
Bilirubin, Direct: 0.3 mg/dL (ref 0.0–0.3)
Total Bilirubin: 1 mg/dL (ref 0.2–1.2)
Total Protein: 6.7 g/dL (ref 6.0–8.3)

## 2023-07-18 LAB — PROTIME-INR
INR: 1.3 {ratio} — ABNORMAL HIGH (ref 0.8–1.0)
Prothrombin Time: 14.1 s — ABNORMAL HIGH (ref 9.6–13.1)

## 2023-07-21 LAB — AFP TUMOR MARKER: AFP-Tumor Marker: 5.1 ng/mL

## 2023-07-25 ENCOUNTER — Encounter: Payer: Self-pay | Admitting: Orthopaedic Surgery

## 2023-07-25 ENCOUNTER — Ambulatory Visit (INDEPENDENT_AMBULATORY_CARE_PROVIDER_SITE_OTHER): Payer: Medicare Other | Admitting: Orthopaedic Surgery

## 2023-07-25 ENCOUNTER — Other Ambulatory Visit (INDEPENDENT_AMBULATORY_CARE_PROVIDER_SITE_OTHER): Payer: Self-pay

## 2023-07-25 VITALS — BP 153/69 | HR 74

## 2023-07-25 DIAGNOSIS — M79605 Pain in left leg: Secondary | ICD-10-CM

## 2023-07-25 NOTE — Progress Notes (Signed)
Office Visit Note   Patient: Emma Lewis           Date of Birth: [birth year]           MRN: [id number] Visit Date: 07/25/2023              Requested by: Wilford Corner, PA-C [address] [place],  [place] [address] PCP: Wilford Corner, PA-C   Assessment & Plan: Visit Diagnoses:  1. Pain in left leg     Plan: Reviewed x-rays with patient she obviously has poor bone quality with multiple compression fractures.  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  No evidence of current infection.  She might require a proximal femoral replacement if surgery is considered due to her osteoporosis and thin cortex.  Will make referral to appropriate tertiary university setting discussed with her possible surgical options.  Follow-Up Instructions: No follow-ups on file.   Orders:  Orders Placed This Encounter  Procedures   XR FEMUR MIN 2 VIEWS LEFT   No orders of the defined types were placed in this encounter.     Procedures: No procedures performed   Clinical Data: No additional findings.   Subjective: Chief Complaint  Patient presents with   Left Leg - Pain    HPI 72 year old female here with osteoporosis is here ambulating with a cane and has knee brace on with all inside hinges.  She has had some pain in her thigh and has a complex history concerning her left hip.  Left total hip by Dr.Jenna done in [place] with postop mechanical problems dislocation of the polyethylene liner.  Patient underwent revision of the acetabular component by Dr. Linna Caprice in 2017.  Right medical cup was taken out and she had a Biomet cup placed with screws.  Patient had middle debris at the time of her surgery with negative cultures.  She later saw me requesting right total hip arthroplasty which was done 01/21/2018 and is done well since.  Patient later had painful hip and aspiration  showed severe metallosis.  Patient had a [facility] stem and had metallosis debris from the trunnion and underwent left total hip revision by me on 04/29/2018.  Components were removed and DePuy 58 cup with 2 dome screws AML fully coated stem ADH and Zimmer femoral cables x 4 were applied.  She had an extended trochanteric osteotomy for removal of the implant.  About 1 month postop on 06/01/2018 she presented with a periprosthetic fracture and was treated with the Zimmer proximal femoral trochanteric plate 15 hole and ORIF of the greater trochanter open reduction of the hip dislocation.  No evidence of infection at that time.  She continued to have drainage from her hip and underwent hip exploration evacuation of hematoma and placement of oral antibiotic beads with VAC on 06/19/2018.  Eventually some cultures grew  rare Proteus mirabilis sensitive to all antibiotics tested.  She was seen by IV team had appropriate IV antibiotics PICC line.  Patient's wound healed and she had subsidence of the femoral stem with negative cultures and underwent left total hip arthroplasty revision of the femoral stem and reimplantation with vancomycin impregnated cement.  The 8 inch fully coated AML stem was cleaned scrubbed placed in Betadine and reimplanted with the vancomycin cement with a +5 metal ball.  Patient now presents for years later with pain in her thigh and x-rays demonstrate some stem  subsidence stem is going into valgus and tip of the stem is going through the lateral cortex.  All cables are intact.  Patient's had history of vertebroplasties not ordered by me and has had chronic compression fractures T11-L5 with chronic back pain.  Patient has been wearing a lift inside of her shoe since she has noticed some shortening on the left side.    Review of Systems updated unchanged.   Objective: Vital Signs: BP (!) 153/69   Pulse 74   Physical Exam Constitutional:      Appearance: She is  well-developed.  HENT:     Head: Normocephalic.     Right Ear: External ear normal.     Left Ear: External ear normal. There is no impacted cerumen.  Eyes:     Pupils: Pupils are equal, round, and reactive to light.  Neck:     Thyroid: No thyromegaly.     Trachea: No tracheal deviation.  Cardiovascular:     Rate and Rhythm: Normal rate.  Pulmonary:     Effort: Pulmonary effort is normal.  Abdominal:     Palpations: Abdomen is soft.  Musculoskeletal:     Cervical back: No rigidity.  Skin:    General: Skin is warm and dry.  Neurological:     Mental Status: She is alert and oriented to person, place, and time.  Psychiatric:        Behavior: Behavior normal.     Ortho Exam posterior hip incisions well-healed no with erythema no cellulitis no drainage of the posterior hip and long lateral incision.  Specialty Comments:  No specialty comments available.  Imaging: No results found.   PMFS History: Patient Active Problem List   Diagnosis Date Noted   History of iron deficiency 05/07/2022   Irritable bowel syndrome with diarrhea 05/07/2022   Lymphocytic colitis 05/07/2022   Iron deficiency anemia 03/18/2022   Positive ANA (antinuclear antibody) 03/08/2022   Anemia 03/06/2022   Collagenous colitis 03/06/2022   Chronic diarrhea 03/06/2022   Secondary esophageal varices without bleeding (HCC) 03/06/2022   Biliary cirrhosis (HCC) 03/06/2022   Gastroesophageal reflux disease 03/06/2022   Cirrhosis of liver without ascites (HCC) 12/12/2021   Lumbar compression fracture (HCC) 12/26/2020   Degenerative arthritis of left knee 12/10/2019   Thoracic compression fracture (HCC) 12/11/2018   Symptomatic anemia    Ambulatory dysfunction 07/21/2018   Weakness of both legs 07/21/2018   Proteus mirabilis infection    Hip hematoma, left 06/19/2018   Hematoma of left hip 06/19/2018   Hip dislocation, left (HCC) 05/29/2018   Peri-prosthetic femur fracture at tip of prosthesis  05/29/2018   Prediabetes    Popping sound of knee joint    Hypoalbuminemia due to protein-calorie malnutrition (HCC)    Transaminitis    Leukocytosis    Postoperative pain    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) 05/01/2018   Acute blood loss anemia    Leukemoid reaction    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   Joint effusion of pelvis or thigh, left 04/22/2018   Trochanteric bursitis, left hip 04/22/2018   History of revision of total hip arthroplasty 11/12/2017   GERD (gastroesophageal reflux disease) 01/17/2017   Hypothyroidism 01/17/2017   Vertigo 01/16/2017   Nausea and vomiting 01/16/2017   Essential hypertension 01/16/2017   Fatty liver 01/16/2017   Failed total hip arthroplasty (HCC) 06/10/2016   Pelvic mass 06/15/2015   Closed wedge compression fracture of fifth lumbar vertebra (HCC)    Cervical spondylosis  02/09/2014   DDD (degenerative disc disease), lumbar 05/13/2013   OA (osteoarthritis) 05/13/2013   Past Medical History:  Diagnosis Date   Allergies    Arthritis    "all over my body" (04/29/2018)   Chronic lower back pain    Fatty liver    Fever blister    Takes Acyclovir   GERD (gastroesophageal reflux disease)    Hyperlipemia    Hypothyroidism    Kidney stones    Migraines    Osteopenia    Pneumonia    "once; years ago" (04/29/2018)   PONV (postoperative nausea and vomiting)    "dry heaves"   Pre-diabetes    Seasonal allergies    Vertigo     Family History  Problem Relation Age of Onset   Lymphoma Mother    Arthritis Mother    Hyperlipidemia Mother    Colon polyps Father    Diabetes Father    Cirrhosis Father    Hyperlipidemia Father    Colon cancer Neg Hx    Stomach cancer Neg Hx    Esophageal cancer Neg Hx    Inflammatory bowel disease Neg Hx    Liver disease Neg Hx    Pancreatic cancer Neg Hx    Rectal cancer Neg Hx     Past Surgical History:  Procedure Laterality Date   ANTERIOR CERVICAL DECOMP/DISCECTOMY FUSION  N/A 02/09/2014   Procedure: ANTERIOR CERVICAL DECOMPRESSION/DISCECTOMY FUSION 2 LEVELS;  Surgeon: Eldred Manges, MD;  Location: [facility] OR;  Service: Orthopedics;  Laterality: N/A;  C4-5, C5-6 Anterior Cervical Discectomy and Fusion, Allograft, Plate   BACK SURGERY     CARPAL TUNNEL RELEASE Right 2018   CATARACT EXTRACTION W/ INTRAOCULAR LENS  IMPLANT, BILATERAL     COLONOSCOPY     FIXATION KYPHOPLASTY LUMBAR SPINE  X 2   HIP ARTHROPLASTY Left 2011   INCISION AND DRAINAGE HIP Left 06/19/2018   Procedure: IRRIGATION AND DEBRIDEMENT LEFT HIP HEMATOMA ANTIBIOTIC BEADS, APPLICATION ON WOUND VAC;  Surgeon: Eldred Manges, MD;  Location: [facility] OR;  Service: Orthopedics;  Laterality: Left;   JOINT REPLACEMENT     KNEE ARTHROSCOPY Right    ORIF PERIPROSTHETIC FRACTURE Left 06/01/2018   Procedure: OPEN REDUCTION INTERNAL FIXATION (ORIF) PERIPROSTHETIC FEMUR FRACTURE;  Surgeon: Eldred Manges, MD;  Location: [facility];  Service: Orthopedics;  Laterality: Left;   PATELLA FRACTURE SURGERY Right ~ 1990   "put metal in"   PATELLA HARDWARE REMOVAL Right    "took the metal out"   REVISION TOTAL HIP ARTHROPLASTY Left 04/29/2018   ROBOTIC ASSISTED BILATERAL SALPINGO OOPHERECTOMY Bilateral 08/01/2015   Procedure: ROBOTIC ASSISTED BILATERAL SALPINGO OOPHORECTOMY;  Surgeon: Adolphus Birchwood, MD;  Location: [facility];  Service: Gynecology;  Laterality: Bilateral;   SHOULDER OPEN ROTATOR CUFF REPAIR Right    3 TOTAL   SHOULDER SURGERY Right    TOTAL HIP ARTHROPLASTY Right 01/21/2018   Procedure: RIGHT TOTAL HIP ARTHROPLASTY DIRECT ANTERIOR;  Surgeon: Eldred Manges, MD;  Location: [facility];  Service: Orthopedics;  Laterality: Right;   TOTAL HIP REVISION Left 06/10/2016   Procedure: TOTAL HIP REVISION ARTHROPLASTY;  Surgeon: Samson Frederic, MD;  Location: [facility] OR;  Service: Orthopedics;  Laterality: Left;   TOTAL HIP REVISION Left 04/29/2018   Procedure: left total hip arthroplasty revision posterior approach;  Surgeon: Eldred Manges, MD;   [facility] OR;  Service: Orthopedics;  Laterality: Left;   TOTAL HIP REVISION Left 09/21/2018   Procedure: left total hip arthroplasty femoral revision;  Surgeon:  Eldred Manges, MD;  Location: [facility] OR;  Service: Orthopedics;  Laterality: Left;   WRIST GANGLION EXCISION Left 1970s   Social History   Occupational History   Not on file  Tobacco Use   Smoking status: Never   Smokeless tobacco: Never  Vaping Use   Vaping status: Never Used  Substance and Sexual Activity   Alcohol use: Not Currently    Comment: `   Drug use: Never   Sexual activity: Not Currently

## 2023-07-28 NOTE — Addendum Note (Signed)
Addended by: Rogers Seeds on: 07/28/2023 08:55 AM   Modules accepted: Orders

## 2023-08-06 ENCOUNTER — Telehealth: Payer: Self-pay | Admitting: Orthopaedic Surgery

## 2023-08-06 NOTE — Telephone Encounter (Signed)
 Patient called. She would like Emma Lewis to call her.

## 2023-08-07 NOTE — Telephone Encounter (Signed)
 I called patient. She states that she has not heard anything from referral to Dr. Tanda. She now has some soreness and redness at her previous incision site. She denies fever, chills, etc. I called Dr. Luigi office. They have received referral and asked for patient to call them to schedule. I spoke with Dr. Barbarann and advised as well. I called patient back and gave her information to call and schedule appointment. She states that she IS NOT coming out tomorrow due to weather. Advised she would need to see assistance if she develops fever, etc. She expressed understanding.

## 2023-08-11 ENCOUNTER — Ambulatory Visit (INDEPENDENT_AMBULATORY_CARE_PROVIDER_SITE_OTHER): Payer: Medicare Other | Admitting: Orthopaedic Surgery

## 2023-08-11 ENCOUNTER — Other Ambulatory Visit (INDEPENDENT_AMBULATORY_CARE_PROVIDER_SITE_OTHER): Payer: Medicare Other

## 2023-08-11 VITALS — BP 134/71 | HR 84

## 2023-08-11 DIAGNOSIS — Z8619 Personal history of other infectious and parasitic diseases: Secondary | ICD-10-CM

## 2023-08-11 DIAGNOSIS — Z96649 Presence of unspecified artificial hip joint: Secondary | ICD-10-CM

## 2023-08-11 DIAGNOSIS — M79605 Pain in left leg: Secondary | ICD-10-CM | POA: Diagnosis not present

## 2023-08-11 NOTE — Progress Notes (Signed)
 Office Visit Note   Patient: Emma Lewis           Date of Birth: Oct 27, 1950           MRN: [id number] Visit Date: 08/11/2023              Requested by: Cyrus Selinda Moose, PA-C [address] [place],  [place] [address] PCP: Cyrus Selinda Moose, PA-C   Assessment & Plan: Visit Diagnoses:  1. History of revision of total hip arthroplasty   2. Pain in left leg   3. History of infection     Plan: Patient's temperature is 97.  Will obtain a sed rate CBC with differential and CRP.  After her revision surgery her CRP was 1.5.  5 years ago it was 14.6 on 07/21/2018 which was a month after irrigation debridement of hip hematoma VAC placement and antibiotic bead placement.  Sedimentation rates 5 years ago 2019 when she had the infected hematoma ranged from 94-122.  Discussed with her that if there is any evidence ongoing infection would recommend aspiration and then we can treated with antibiotics.  This looks more likely that subsidence with the femur fracture and full weightbearing is causing discomfort and erythema.  She will use the wheelchair and use the walker and nonweightbearing on the left lower extremity.  Will put her on the call in last in case Dr. Tanda has a cancellation and could possibly see her earlier.  X-rays were burned on a disk and given to the patient.  Follow-Up Instructions: No follow-ups on file.   Orders:  Orders Placed This Encounter  Procedures   XR FEMUR MIN 2 VIEWS LEFT   Sed Rate (ESR)   CBC with Differential   C-reactive protein   No orders of the defined types were placed in this encounter.     Procedures: No procedures performed   Clinical Data: No additional findings.   Subjective: Chief Complaint  Patient presents with   Left Leg - Pain    HPI 73 year old female returns early she has been using a cane and not her walker had increased redness midportion of her incision.  See my last note for extensive description of her medical  history concerning her left hip has had multiple procedures.  She has been applying ice to the midportion of the incision has been red and she has some skin peeling.  No drainage present.  Patient has long incision from femoral revision with long plate that extends almost down to the knee.  No fever or chills.  Remote history of postop hematoma after hip revision with cultures grew Proteus mirabilis sensitive to all antibiotics tested.  She had a hematoma no pus at that time.  She was treated with antibiotics see my previous note.  Review of Systems all systems updated unchanged.   Objective: Vital Signs: BP 134/71   Pulse 84   Physical Exam Constitutional:      Appearance: She is well-developed.  HENT:     Head: Normocephalic.     Right Ear: External ear normal.     Left Ear: External ear normal. There is no impacted cerumen.  Eyes:     Pupils: Pupils are equal, round, and reactive to light.  Neck:     Thyroid : No thyromegaly.     Trachea: No tracheal deviation.  Cardiovascular:     Rate and Rhythm: Normal rate.  Pulmonary:     Effort: Pulmonary effort is normal.  Abdominal:  Palpations: Abdomen is soft.  Musculoskeletal:     Cervical back: No rigidity.  Skin:    General: Skin is warm and dry.  Neurological:     Mental Status: She is alert and oriented to person, place, and time.  Psychiatric:        Behavior: Behavior normal.     Ortho Exam midportion incision is erythematous not warm no drainage.  Specialty Comments:  No specialty comments available.  Imaging: No results found.   PMFS History: Patient Active Problem List   Diagnosis Date Noted   History of iron deficiency 05/07/2022   Irritable bowel syndrome with diarrhea 05/07/2022   Lymphocytic colitis 05/07/2022   Iron deficiency anemia 03/18/2022   Positive ANA (antinuclear antibody) 03/08/2022   Anemia 03/06/2022   Collagenous colitis 03/06/2022   Chronic diarrhea 03/06/2022   Secondary esophageal  varices without bleeding (HCC) 03/06/2022   Biliary cirrhosis (HCC) 03/06/2022   Gastroesophageal reflux disease 03/06/2022   Cirrhosis of liver without ascites (HCC) 12/12/2021   Lumbar compression fracture (HCC) 12/26/2020   Degenerative arthritis of left knee 12/10/2019   Thoracic compression fracture (HCC) 12/11/2018   Symptomatic anemia    Ambulatory dysfunction 07/21/2018   Weakness of both legs 07/21/2018   Proteus mirabilis infection    Hip hematoma, left 06/19/2018   Hematoma of left hip 06/19/2018   Hip dislocation, left (HCC) 05/29/2018   Peri-prosthetic femur fracture at tip of prosthesis 05/29/2018   Prediabetes    Popping sound of knee joint    Hypoalbuminemia due to protein-calorie malnutrition (HCC)    Transaminitis    Leukocytosis    Postoperative pain    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) 05/01/2018   Acute blood loss anemia    Leukemoid reaction    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   Joint effusion of pelvis or thigh, left 04/22/2018   Trochanteric bursitis, left hip 04/22/2018   History of revision of total hip arthroplasty 11/12/2017   GERD (gastroesophageal reflux disease) 01/17/2017   Hypothyroidism 01/17/2017   Vertigo 01/16/2017   Nausea and vomiting 01/16/2017   Essential hypertension 01/16/2017   Fatty liver 01/16/2017   Failed total hip arthroplasty (HCC) 06/10/2016   Pelvic mass 06/15/2015   Closed wedge compression fracture of fifth lumbar vertebra (HCC)    Cervical spondylosis 02/09/2014   DDD (degenerative disc disease), lumbar 05/13/2013   OA (osteoarthritis) 05/13/2013   Past Medical History:  Diagnosis Date   Allergies    Arthritis    all over my body (04/29/2018)   Chronic lower back pain    Fatty liver    Fever blister    Takes Acyclovir    GERD (gastroesophageal reflux disease)    Hyperlipemia    Hypothyroidism    Kidney stones    Migraines    Osteopenia    Pneumonia    once; years ago (04/29/2018)    PONV (postoperative nausea and vomiting)    dry heaves   Pre-diabetes    Seasonal allergies    Vertigo     Family History  Problem Relation Age of Onset   Lymphoma Mother    Arthritis Mother    Hyperlipidemia Mother    Colon polyps Father    Diabetes Father    Cirrhosis Father    Hyperlipidemia Father    Colon cancer Neg Hx    Stomach cancer Neg Hx    Esophageal cancer Neg Hx    Inflammatory bowel disease Neg Hx    Liver  disease Neg Hx    Pancreatic cancer Neg Hx    Rectal cancer Neg Hx     Past Surgical History:  Procedure Laterality Date   ANTERIOR CERVICAL DECOMP/DISCECTOMY FUSION N/A 02/09/2014   Procedure: ANTERIOR CERVICAL DECOMPRESSION/DISCECTOMY FUSION 2 LEVELS;  Surgeon: Oneil JAYSON Herald, MD;  Location: [facility] OR;  Service: Orthopedics;  Laterality: N/A;  C4-5, C5-6 Anterior Cervical Discectomy and Fusion, Allograft, Plate   BACK SURGERY     CARPAL TUNNEL RELEASE Right 2018   CATARACT EXTRACTION W/ INTRAOCULAR LENS  IMPLANT, BILATERAL     COLONOSCOPY     FIXATION KYPHOPLASTY LUMBAR SPINE  X 2   HIP ARTHROPLASTY Left 2011   INCISION AND DRAINAGE HIP Left 06/19/2018   Procedure: IRRIGATION AND DEBRIDEMENT LEFT HIP HEMATOMA ANTIBIOTIC BEADS, APPLICATION ON WOUND VAC;  Surgeon: Herald Oneil JAYSON, MD;  Location: [facility] OR;  Service: Orthopedics;  Laterality: Left;   JOINT REPLACEMENT     KNEE ARTHROSCOPY Right    ORIF PERIPROSTHETIC FRACTURE Left 06/01/2018   Procedure: OPEN REDUCTION INTERNAL FIXATION (ORIF) PERIPROSTHETIC FEMUR FRACTURE;  Surgeon: Herald Oneil JAYSON, MD;  Location: [facility] OR;  Service: Orthopedics;  Laterality: Left;   PATELLA FRACTURE SURGERY Right ~ 1990   put metal in   PATELLA HARDWARE REMOVAL Right    took the metal out   REVISION TOTAL HIP ARTHROPLASTY Left 04/29/2018   ROBOTIC ASSISTED BILATERAL SALPINGO OOPHERECTOMY Bilateral 08/01/2015   Procedure: ROBOTIC ASSISTED BILATERAL SALPINGO OOPHORECTOMY;  Surgeon: Maurilio Ship, MD;  Location: [facility];  Service:  Gynecology;  Laterality: Bilateral;   SHOULDER OPEN ROTATOR CUFF REPAIR Right    3 TOTAL   SHOULDER SURGERY Right    TOTAL HIP ARTHROPLASTY Right 01/21/2018   Procedure: RIGHT TOTAL HIP ARTHROPLASTY DIRECT ANTERIOR;  Surgeon: Herald Oneil JAYSON, MD;  Location: [facility] OR;  Service: Orthopedics;  Laterality: Right;   TOTAL HIP REVISION Left 06/10/2016   Procedure: TOTAL HIP REVISION ARTHROPLASTY;  Surgeon: Redell Shoals, MD;  Location: [facility] OR;  Service: Orthopedics;  Laterality: Left;   TOTAL HIP REVISION Left 04/29/2018   Procedure: left total hip arthroplasty revision posterior approach;  Surgeon: Herald Oneil JAYSON, MD;  Location: [facility] OR;  Service: Orthopedics;  Laterality: Left;   TOTAL HIP REVISION Left 09/21/2018   Procedure: left total hip arthroplasty femoral revision;  Surgeon: Herald Oneil JAYSON, MD;  Location: [facility] OR;  Service: Orthopedics;  Laterality: Left;   WRIST GANGLION EXCISION Left 1970s   Social History   Occupational History   Not on file  Tobacco Use   Smoking status: Never   Smokeless tobacco: Never  Vaping Use   Vaping status: Never Used  Substance and Sexual Activity   Alcohol  use: Not Currently    Comment: `   Drug use: Never   Sexual activity: Not Currently

## 2023-08-12 LAB — CBC WITH DIFFERENTIAL/PLATELET
Absolute Lymphocytes: 1392 {cells}/uL (ref 850–3900)
Absolute Monocytes: 419 {cells}/uL (ref 200–950)
Basophils Absolute: 43 {cells}/uL (ref 0–200)
Basophils Relative: 0.6 %
Eosinophils Absolute: 312 {cells}/uL (ref 15–500)
Eosinophils Relative: 4.4 %
HCT: 37.6 % (ref 35.0–45.0)
Hemoglobin: 12.7 g/dL (ref 11.7–15.5)
MCH: 33.2 pg — ABNORMAL HIGH (ref 27.0–33.0)
MCHC: 33.8 g/dL (ref 32.0–36.0)
MCV: 98.4 fL (ref 80.0–100.0)
MPV: 10.7 fL (ref 7.5–12.5)
Monocytes Relative: 5.9 %
Neutro Abs: 4935 {cells}/uL (ref 1500–7800)
Neutrophils Relative %: 69.5 %
Platelets: 207 10*3/uL (ref 140–400)
RBC: 3.82 10*6/uL (ref 3.80–5.10)
RDW: 13.3 % (ref 11.0–15.0)
Total Lymphocyte: 19.6 %
WBC: 7.1 10*3/uL (ref 3.8–10.8)

## 2023-08-12 LAB — C-REACTIVE PROTEIN: CRP: 15.7 mg/L — ABNORMAL HIGH (ref <8.0)

## 2023-08-12 LAB — SEDIMENTATION RATE: Sed Rate: 45 mm/h — ABNORMAL HIGH (ref 0–30)

## 2023-08-18 ENCOUNTER — Telehealth: Payer: Self-pay | Admitting: Orthopaedic Surgery

## 2023-08-18 NOTE — Telephone Encounter (Signed)
Pt asking for Dr Ophelia Charter go call her about her wound. Pt phone number is [phone].

## 2023-08-18 NOTE — Telephone Encounter (Signed)
Pt called stating her wound is draining on left hip.

## 2023-08-20 NOTE — Telephone Encounter (Signed)
Per Ophelia Charter if patient is able to come in, she will come in and he will see her

## 2023-08-22 ENCOUNTER — Ambulatory Visit: Payer: Medicare Other

## 2023-08-22 ENCOUNTER — Other Ambulatory Visit: Payer: Medicare Other

## 2023-08-22 ENCOUNTER — Ambulatory Visit: Payer: Medicare Other | Admitting: Internal Medicine

## 2023-08-22 DIAGNOSIS — Z471 Aftercare following joint replacement surgery: Secondary | ICD-10-CM | POA: Diagnosis not present

## 2023-08-22 DIAGNOSIS — Z96642 Presence of left artificial hip joint: Secondary | ICD-10-CM | POA: Diagnosis not present

## 2023-08-22 DIAGNOSIS — T84011A Broken internal left hip prosthesis, initial encounter: Secondary | ICD-10-CM | POA: Diagnosis not present

## 2023-08-26 ENCOUNTER — Ambulatory Visit: Payer: Medicare Other

## 2023-08-26 ENCOUNTER — Other Ambulatory Visit: Payer: Medicare Other

## 2023-08-26 ENCOUNTER — Ambulatory Visit: Payer: Medicare Other | Admitting: Internal Medicine

## 2023-08-26 DIAGNOSIS — Z9889 Other specified postprocedural states: Secondary | ICD-10-CM | POA: Diagnosis not present

## 2023-08-26 DIAGNOSIS — G8929 Other chronic pain: Secondary | ICD-10-CM | POA: Diagnosis not present

## 2023-08-26 DIAGNOSIS — Z96642 Presence of left artificial hip joint: Secondary | ICD-10-CM | POA: Diagnosis not present

## 2023-08-26 DIAGNOSIS — M25552 Pain in left hip: Secondary | ICD-10-CM | POA: Diagnosis not present

## 2023-08-26 DIAGNOSIS — T84011A Broken internal left hip prosthesis, initial encounter: Secondary | ICD-10-CM | POA: Diagnosis not present

## 2023-08-29 DIAGNOSIS — Z96642 Presence of left artificial hip joint: Secondary | ICD-10-CM | POA: Diagnosis not present

## 2023-08-29 DIAGNOSIS — M9702XD Periprosthetic fracture around internal prosthetic left hip joint, subsequent encounter: Secondary | ICD-10-CM | POA: Diagnosis not present

## 2023-08-29 DIAGNOSIS — Z471 Aftercare following joint replacement surgery: Secondary | ICD-10-CM | POA: Diagnosis not present

## 2023-09-08 DIAGNOSIS — T148XXA Other injury of unspecified body region, initial encounter: Secondary | ICD-10-CM | POA: Diagnosis not present

## 2023-09-25 DIAGNOSIS — E785 Hyperlipidemia, unspecified: Secondary | ICD-10-CM | POA: Diagnosis not present

## 2023-09-25 DIAGNOSIS — D509 Iron deficiency anemia, unspecified: Secondary | ICD-10-CM | POA: Diagnosis not present

## 2023-10-09 DIAGNOSIS — T148XXA Other injury of unspecified body region, initial encounter: Secondary | ICD-10-CM | POA: Diagnosis not present

## 2023-10-09 DIAGNOSIS — K219 Gastro-esophageal reflux disease without esophagitis: Secondary | ICD-10-CM | POA: Diagnosis not present

## 2023-10-09 DIAGNOSIS — M25152 Fistula, left hip: Secondary | ICD-10-CM | POA: Diagnosis not present

## 2023-10-09 DIAGNOSIS — L02416 Cutaneous abscess of left lower limb: Secondary | ICD-10-CM | POA: Diagnosis not present

## 2023-10-09 DIAGNOSIS — T8130XA Disruption of wound, unspecified, initial encounter: Secondary | ICD-10-CM | POA: Diagnosis not present

## 2023-10-09 DIAGNOSIS — E039 Hypothyroidism, unspecified: Secondary | ICD-10-CM | POA: Diagnosis not present

## 2023-10-09 DIAGNOSIS — K7460 Unspecified cirrhosis of liver: Secondary | ICD-10-CM | POA: Diagnosis not present

## 2023-10-09 DIAGNOSIS — Z83438 Family history of other disorder of lipoprotein metabolism and other lipidemia: Secondary | ICD-10-CM | POA: Diagnosis not present

## 2023-10-09 DIAGNOSIS — E785 Hyperlipidemia, unspecified: Secondary | ICD-10-CM | POA: Diagnosis not present

## 2023-10-09 DIAGNOSIS — Z96642 Presence of left artificial hip joint: Secondary | ICD-10-CM | POA: Diagnosis not present

## 2023-10-09 DIAGNOSIS — T8183XA Persistent postprocedural fistula, initial encounter: Secondary | ICD-10-CM | POA: Diagnosis not present

## 2023-10-09 DIAGNOSIS — Z79890 Hormone replacement therapy (postmenopausal): Secondary | ICD-10-CM | POA: Diagnosis not present

## 2023-10-09 DIAGNOSIS — Z79899 Other long term (current) drug therapy: Secondary | ICD-10-CM | POA: Diagnosis not present

## 2023-10-09 NOTE — H&P (Signed)
 H&P reviewed. The patient was examined and there are no changes to the H&P.

## 2023-10-21 DIAGNOSIS — T84011A Broken internal left hip prosthesis, initial encounter: Secondary | ICD-10-CM | POA: Diagnosis not present

## 2023-10-21 DIAGNOSIS — T148XXA Other injury of unspecified body region, initial encounter: Secondary | ICD-10-CM | POA: Diagnosis not present

## 2023-11-10 DIAGNOSIS — Z1231 Encounter for screening mammogram for malignant neoplasm of breast: Secondary | ICD-10-CM | POA: Diagnosis not present

## 2023-11-10 DIAGNOSIS — E039 Hypothyroidism, unspecified: Secondary | ICD-10-CM | POA: Diagnosis not present

## 2023-11-10 DIAGNOSIS — D509 Iron deficiency anemia, unspecified: Secondary | ICD-10-CM | POA: Diagnosis not present

## 2023-11-10 DIAGNOSIS — Z0000 Encounter for general adult medical examination without abnormal findings: Secondary | ICD-10-CM | POA: Diagnosis not present

## 2023-11-10 DIAGNOSIS — M810 Age-related osteoporosis without current pathological fracture: Secondary | ICD-10-CM | POA: Diagnosis not present

## 2023-11-10 DIAGNOSIS — Z1239 Encounter for other screening for malignant neoplasm of breast: Secondary | ICD-10-CM | POA: Diagnosis not present

## 2023-11-10 DIAGNOSIS — K7460 Unspecified cirrhosis of liver: Secondary | ICD-10-CM | POA: Diagnosis not present

## 2023-11-10 DIAGNOSIS — E785 Hyperlipidemia, unspecified: Secondary | ICD-10-CM | POA: Diagnosis not present

## 2023-11-10 DIAGNOSIS — K219 Gastro-esophageal reflux disease without esophagitis: Secondary | ICD-10-CM | POA: Diagnosis not present

## 2023-11-21 DIAGNOSIS — T8130XA Disruption of wound, unspecified, initial encounter: Secondary | ICD-10-CM | POA: Diagnosis not present

## 2023-11-21 DIAGNOSIS — Z96642 Presence of left artificial hip joint: Secondary | ICD-10-CM | POA: Diagnosis not present

## 2023-11-21 DIAGNOSIS — Z96649 Presence of unspecified artificial hip joint: Secondary | ICD-10-CM | POA: Diagnosis not present

## 2023-12-03 DIAGNOSIS — T84011A Broken internal left hip prosthesis, initial encounter: Secondary | ICD-10-CM | POA: Diagnosis not present

## 2023-12-03 DIAGNOSIS — T8452XA Infection and inflammatory reaction due to internal left hip prosthesis, initial encounter: Secondary | ICD-10-CM | POA: Diagnosis not present

## 2023-12-03 DIAGNOSIS — T148XXA Other injury of unspecified body region, initial encounter: Secondary | ICD-10-CM | POA: Diagnosis not present

## 2024-01-09 DIAGNOSIS — E785 Hyperlipidemia, unspecified: Secondary | ICD-10-CM | POA: Diagnosis not present

## 2024-01-09 DIAGNOSIS — Z0000 Encounter for general adult medical examination without abnormal findings: Secondary | ICD-10-CM | POA: Diagnosis not present

## 2024-01-16 DIAGNOSIS — R310 Gross hematuria: Secondary | ICD-10-CM | POA: Diagnosis not present

## 2024-01-23 DIAGNOSIS — R310 Gross hematuria: Secondary | ICD-10-CM | POA: Diagnosis not present

## 2024-01-23 DIAGNOSIS — N200 Calculus of kidney: Secondary | ICD-10-CM | POA: Diagnosis not present

## 2024-01-23 DIAGNOSIS — K5730 Diverticulosis of large intestine without perforation or abscess without bleeding: Secondary | ICD-10-CM | POA: Diagnosis not present

## 2024-01-23 DIAGNOSIS — K7469 Other cirrhosis of liver: Secondary | ICD-10-CM | POA: Diagnosis not present

## 2024-01-28 DIAGNOSIS — M7989 Other specified soft tissue disorders: Secondary | ICD-10-CM | POA: Diagnosis not present

## 2024-01-28 DIAGNOSIS — Z96642 Presence of left artificial hip joint: Secondary | ICD-10-CM | POA: Diagnosis not present

## 2024-02-06 DIAGNOSIS — R310 Gross hematuria: Secondary | ICD-10-CM | POA: Diagnosis not present

## 2024-02-06 DIAGNOSIS — N3946 Mixed incontinence: Secondary | ICD-10-CM | POA: Diagnosis not present

## 2024-02-13 DIAGNOSIS — Z96641 Presence of right artificial hip joint: Secondary | ICD-10-CM | POA: Diagnosis not present

## 2024-02-13 DIAGNOSIS — M25562 Pain in left knee: Secondary | ICD-10-CM | POA: Diagnosis not present

## 2024-02-13 DIAGNOSIS — Z96642 Presence of left artificial hip joint: Secondary | ICD-10-CM | POA: Diagnosis not present

## 2024-02-13 DIAGNOSIS — R203 Hyperesthesia: Secondary | ICD-10-CM | POA: Diagnosis not present

## 2024-02-13 DIAGNOSIS — Z96643 Presence of artificial hip joint, bilateral: Secondary | ICD-10-CM | POA: Diagnosis not present

## 2024-02-13 DIAGNOSIS — M1712 Unilateral primary osteoarthritis, left knee: Secondary | ICD-10-CM | POA: Diagnosis not present

## 2024-02-13 DIAGNOSIS — Z4789 Encounter for other orthopedic aftercare: Secondary | ICD-10-CM | POA: Diagnosis not present

## 2024-02-20 DIAGNOSIS — N3946 Mixed incontinence: Secondary | ICD-10-CM | POA: Diagnosis not present

## 2024-02-28 ENCOUNTER — Encounter: Payer: Self-pay | Admitting: Gastroenterology

## 2024-03-03 DIAGNOSIS — M47816 Spondylosis without myelopathy or radiculopathy, lumbar region: Secondary | ICD-10-CM | POA: Diagnosis not present

## 2024-03-04 NOTE — Telephone Encounter (Signed)
 Referral and last office visit faxed.  Electronically signed by: Edsel Earnie Sharps, RN 03/04/2024 11:43 AM

## 2024-03-04 NOTE — Telephone Encounter (Signed)
 Patient Husband is calling wanting to know if the referral for low back pain can be faxed to [facility].  Fax: [phone]  Atten: Andres

## 2024-03-04 NOTE — Telephone Encounter (Signed)
 Patient husband Emma Lewis returned call to advise that he has spoken with [facility] who has  advised that they have not received the order or notes regarding the patient   Patient husband Emma Lewis also requested for a call back from the office with confirmation of fax   Patient husband also advised that the patient has sn appointment scheduled with [facility] on 03/09/2024 and would need to have the referral and notes prior to then    [phone]

## 2024-03-04 NOTE — Telephone Encounter (Signed)
 Patient husband is calling wanting to know if Dr. Tanda can send an order to [facility] for patient to have MRI done.  MRI of lower back w/ contrast.  Order can be faxed to:  Fax: [phone]   Patient husband is also requesting that a referral for low back pain be faxed to [facility] also for a consult.   Any questions Issac can be reached   Ph: [phone]

## 2024-03-04 NOTE — Telephone Encounter (Signed)
 Patient's spouse called back to see if the referral had been sent to [facility].   Read him the directive below in bold. He expressed appreciation. Nothing further needed at this time.

## 2024-03-04 NOTE — Telephone Encounter (Signed)
 Spoke with patient's husband and advised him that Dr. Tanda would not order an MRI of her spine. We referred her to the spine specialists and they would be the ones to determine is she needs an MRI. He states that a doctor in [place] has agreed to see her but only if she has an MRI first. Advised him that maybe her PCP would order an MRI but Dr. Tanda will not be ordering an MRI. I am not sure why a doctor would not see a patient without having advanced imaging. She needs to see a spine specialist for any further work up of her spine.  Electronically signed by: Edsel Earnie Sharps, RN 03/04/2024 11:14 AM

## 2024-03-05 ENCOUNTER — Other Ambulatory Visit: Payer: Self-pay | Admitting: Orthopedic Surgery

## 2024-03-05 DIAGNOSIS — M545 Low back pain, unspecified: Secondary | ICD-10-CM

## 2024-03-09 ENCOUNTER — Other Ambulatory Visit: Payer: Self-pay | Admitting: Interventional Radiology

## 2024-03-09 ENCOUNTER — Other Ambulatory Visit (HOSPITAL_COMMUNITY): Payer: Self-pay | Admitting: Interventional Radiology

## 2024-03-09 ENCOUNTER — Ambulatory Visit
Admission: RE | Admit: 2024-03-09 | Discharge: 2024-03-09 | Disposition: A | Discharge status: Home / Self Care | Source: Ambulatory Visit | Attending: Orthopedic Surgery | Admitting: Orthopedic Surgery

## 2024-03-09 VITALS — BP 151/71 | HR 81 | Temp 97.9°F | Resp 16 | Wt 173.0 lb

## 2024-03-09 DIAGNOSIS — G8929 Other chronic pain: Secondary | ICD-10-CM

## 2024-03-09 DIAGNOSIS — S32050A Wedge compression fracture of fifth lumbar vertebra, initial encounter for closed fracture: Secondary | ICD-10-CM

## 2024-03-09 DIAGNOSIS — M5136 Other intervertebral disc degeneration, lumbar region with discogenic back pain only: Secondary | ICD-10-CM

## 2024-03-09 DIAGNOSIS — S32010A Wedge compression fracture of first lumbar vertebra, initial encounter for closed fracture: Secondary | ICD-10-CM | POA: Diagnosis not present

## 2024-03-09 DIAGNOSIS — D500 Iron deficiency anemia secondary to blood loss (chronic): Secondary | ICD-10-CM

## 2024-03-09 DIAGNOSIS — K7460 Unspecified cirrhosis of liver: Secondary | ICD-10-CM

## 2024-03-09 DIAGNOSIS — K760 Fatty (change of) liver, not elsewhere classified: Secondary | ICD-10-CM | POA: Diagnosis not present

## 2024-03-09 DIAGNOSIS — M545 Low back pain, unspecified: Secondary | ICD-10-CM

## 2024-03-09 HISTORY — PX: IR RADIOLOGIST EVAL & MGMT: IMG5224

## 2024-03-09 NOTE — Consult Note (Addendum)
 Chief Complaint: Patient was seen in consultation today for low back pain at the request of Wilson,Scott  Referring Physician(s): Wilson,Scott  History of Present Illness: Emma Lewis is a 73 y.o. female with a long history of back pain and osteoporotic compression fractures (MRI evidence demonstrates remote healed fractures of T11, T12, L1 and L2 as well as remote fractures with changes of prior cement augmentation at L3, L4 and L5) presents with severe exacerbation of her lower back pain since approximately July 20.  She was being evaluated for kidney stones and had to lay flat on an x-ray table.  She was unable to get up off the table and the technician had to help her up.  The following day she began experiencing new severe lower back pain.  This has significantly impacted her quality of life.  She is currently taking Percocet for the pain which helps only minimally.  She denies radiculopathy into either lower extremity.  On review of symptoms she does have significant left lower extremity edema which she states began at about the same time as her lower back pain.  She has a history of left hip arthroplasty with infection requiring revision and multiple incision and drainage procedures.  Past Medical History:  Diagnosis Date   Allergies    Arthritis    all over my body (04/29/2018)   Chronic lower back pain    Fatty liver    Fever blister    Takes Acyclovir    GERD (gastroesophageal reflux disease)    Hyperlipemia    Hypothyroidism    Kidney stones    Migraines    Osteopenia    Pneumonia    once; years ago (04/29/2018)   PONV (postoperative nausea and vomiting)    dry heaves   Pre-diabetes    Seasonal allergies    Vertigo     Past Surgical History:  Procedure Laterality Date   ANTERIOR CERVICAL DECOMP/DISCECTOMY FUSION N/A 02/09/2014   Procedure: ANTERIOR CERVICAL DECOMPRESSION/DISCECTOMY FUSION 2 LEVELS;  Surgeon: Oneil JAYSON Herald, MD;  Location: [facility] OR;  Service:  Orthopedics;  Laterality: N/A;  C4-5, C5-6 Anterior Cervical Discectomy and Fusion, Allograft, Plate   BACK SURGERY     CARPAL TUNNEL RELEASE Right 2018   CATARACT EXTRACTION W/ INTRAOCULAR LENS  IMPLANT, BILATERAL     COLONOSCOPY     FIXATION KYPHOPLASTY LUMBAR SPINE  X 2   HIP ARTHROPLASTY Left 2011   INCISION AND DRAINAGE HIP Left 06/19/2018   Procedure: IRRIGATION AND DEBRIDEMENT LEFT HIP HEMATOMA ANTIBIOTIC BEADS, APPLICATION ON WOUND VAC;  Surgeon: Herald Oneil JAYSON, MD;  Location: [facility] OR;  Service: Orthopedics;  Laterality: Left;   JOINT REPLACEMENT     KNEE ARTHROSCOPY Right    ORIF PERIPROSTHETIC FRACTURE Left 06/01/2018   Procedure: OPEN REDUCTION INTERNAL FIXATION (ORIF) PERIPROSTHETIC FEMUR FRACTURE;  Surgeon: Herald Oneil JAYSON, MD;  Location: [facility] OR;  Service: Orthopedics;  Laterality: Left;   PATELLA FRACTURE SURGERY Right ~ 1990   put metal in   PATELLA HARDWARE REMOVAL Right    took the metal out   REVISION TOTAL HIP ARTHROPLASTY Left 04/29/2018   ROBOTIC ASSISTED BILATERAL SALPINGO OOPHERECTOMY Bilateral 08/01/2015   Procedure: ROBOTIC ASSISTED BILATERAL SALPINGO OOPHORECTOMY;  Surgeon: Maurilio Ship, MD;  Location: [facility];  Service: Gynecology;  Laterality: Bilateral;   SHOULDER OPEN ROTATOR CUFF REPAIR Right    3 TOTAL   SHOULDER SURGERY Right    TOTAL HIP ARTHROPLASTY Right 01/21/2018   Procedure: RIGHT TOTAL HIP ARTHROPLASTY  DIRECT ANTERIOR;  Surgeon: Barbarann Oneil BROCKS, MD;  Location: [facility] OR;  Service: Orthopedics;  Laterality: Right;   TOTAL HIP REVISION Left 06/10/2016   Procedure: TOTAL HIP REVISION ARTHROPLASTY;  Surgeon: Redell Shoals, MD;  Location: [facility] OR;  Service: Orthopedics;  Laterality: Left;   TOTAL HIP REVISION Left 04/29/2018   Procedure: left total hip arthroplasty revision posterior approach;  Surgeon: Barbarann Oneil BROCKS, MD;  Location: [facility] OR;  Service: Orthopedics;  Laterality: Left;   TOTAL HIP REVISION Left 09/21/2018   Procedure: left total hip arthroplasty femoral  revision;  Surgeon: Barbarann Oneil BROCKS, MD;  Location: [facility] OR;  Service: Orthopedics;  Laterality: Left;   WRIST GANGLION EXCISION Left 1970s    Allergies: Entocort ec  [budesonide ], Sulfa antibiotics, and Tape  Medications: Prior to Admission medications   Medication Sig Start Date End Date Taking? Authorizing Provider  acyclovir  (ZOVIRAX ) 400 MG tablet Take 1 tablet (400 mg total) by mouth 2 (two) times daily. 05/15/18  Yes Angiulli, Toribio PARAS, PA-C  AMBULATORY NON FORMULARY MEDICATION Nutrafol Take 4 capsule by mouth daily   Yes [provider]  atorvastatin (LIPITOR) 40 MG tablet Take 40 mg by mouth daily.   Yes [provider]  Calcium  Carb-Cholecalciferol  (CALCIUM  600 + D PO) Take 1 tablet by mouth 3 (three) times daily.    Yes [provider]  Cholecalciferol  (VITAMIN D3) 2000 units TABS Take 2,000 Units by mouth 2 (two) times daily.    Yes [provider]  Coenzyme Q10 (CO Q10) 100 MG CAPS Take 1 capsule by mouth daily.   Yes [provider]  Cyanocobalamin  (VITAMIN B-12) 2500 MCG SUBL Place 1 tablet under the tongue daily.   Yes [provider]  ibuprofen  (ADVIL ) 200 MG tablet Take 800 mg by mouth 2 (two) times daily.   Yes [provider]  levothyroxine  (EUTHYROX ) 100 MCG tablet Take 75 mcg by mouth daily before breakfast.   Yes [provider]  Menaquinone-7 (VITAMIN K2) 100 MCG CAPS Take 1 capsule by mouth daily.   Yes [provider]  omeprazole  (PRILOSEC  OTC) 20 MG tablet Take 10 mg by mouth daily.   Yes [provider]  Turmeric Curcumin 500 MG CAPS Take 1 capsule by mouth 2 (two) times daily.   Yes [provider]  AMBULATORY NON FORMULARY MEDICATION Beet Root Take 2 capsule by mouth daily Patient not taking: Reported on 03/09/2024    [provider]  AMBULATORY NON FORMULARY MEDICATION Aspercreme Patch 1 patch topical daily Patient not taking: Reported on 03/09/2024     [provider]  B-D UF III MINI PEN NEEDLES 31G X 5 MM MISC Inject into the skin as directed. Patient not taking: Reported on 10/17/2022 03/26/22   [provider]  diphenoxylate -atropine  (LOMOTIL ) 2.5-0.025 MG tablet TAKE 1-3 TABLETS BY MOUTH 4 TIMES DAILY AS NEEDED FOR  DIARRHEA  OR  LOOSE  STOOLS Patient not taking: Reported on 03/09/2024 06/03/22   Mansouraty, Aloha Raddle., MD  nadolol  (CORGARD ) 20 MG tablet Take 1/2 tab (10 mg) for 1 week then increase to 1 tab (20 mg) daily Patient not taking: Reported on 03/09/2024 05/22/22   Mansouraty, Aloha Raddle., MD  Peppermint Oil (IBGARD) 90 MG CPCR Take by mouth. Take 2 capsules by mouth in the mornings and 1-2 tablets at night Patient not taking: Reported on 03/09/2024    [provider]     Family History  Problem Relation Age of Onset   Lymphoma Mother  Arthritis Mother    Hyperlipidemia Mother    Colon polyps Father    Diabetes Father    Cirrhosis Father    Hyperlipidemia Father    Colon cancer Neg Hx    Stomach cancer Neg Hx    Esophageal cancer Neg Hx    Inflammatory bowel disease Neg Hx    Liver disease Neg Hx    Pancreatic cancer Neg Hx    Rectal cancer Neg Hx     Social History   Socioeconomic History   Marital status: Married    Spouse name: Not on file   Number of children: 1   Years of education: Not on file   Highest education level: Not on file  Occupational History   Not on file  Tobacco Use   Smoking status: Never   Smokeless tobacco: Never  Vaping Use   Vaping status: Never Used  Substance and Sexual Activity   Alcohol  use: Not Currently    Comment: `   Drug use: Never   Sexual activity: Not Currently  Other Topics Concern   Not on file  Social History Narrative   Not on file   Social Drivers of Health   Financial Resource Strain: Low Risk  (11/10/2023)   Received from [facility]   Overall Financial Resource Strain (CARDIA)    Difficulty of Paying Living  Expenses: Not hard at all  Food Insecurity: Patient Declined (11/07/2023)   Received from [facility]   Hunger Vital Sign    Within the past 12 months, you worried that your food would run out before you got the money to buy more.: Patient declined    Within the past 12 months, the food you bought just didn't last and you didn't have money to get more.: Patient declined  Transportation Needs: No Transportation Needs (11/07/2023)   Received from [facility] - Transportation    In the past 12 months, has lack of transportation kept you from medical appointments or from getting medications?: No    Lack of Transportation (Non-Medical): No  Physical Activity: Not on file  Stress: Not on file  Social Connections: Not on file   Review of Systems: A 12 point ROS discussed and pertinent positives are indicated in the HPI above.  All other systems are negative.  Review of Systems  Vital Signs: BP (!) 151/71 (BP Location: Left Arm, Patient Position: Sitting, Cuff Size: Normal)   Pulse 81   Temp 97.9 F (36.6 C) (Oral)   Resp 16   Wt 78.5 kg   BMI 31.64 kg/m     Physical Exam Constitutional:      General: She is not in acute distress.    Appearance: Normal appearance. She is obese.  HENT:     Head: Normocephalic and atraumatic.  Eyes:     General: No scleral icterus. Cardiovascular:     Rate and Rhythm: Normal rate.  Pulmonary:     Effort: Pulmonary effort is normal.  Abdominal:     General: There is no distension.     Tenderness: There is no abdominal tenderness.  Musculoskeletal:       Back:     Comments: + TTP at L5-S1 and along the sacrum  Skin:    General: Skin is warm and dry.  Neurological:     Mental Status: She is alert and oriented to person, place, and time.  Psychiatric:        Behavior:  Behavior normal.       Imaging: No results found.  Labs:  CBC: Recent Labs    03/20/23 1321 08/11/23 1142  WBC 4.6  7.1  HGB 10.2* 12.7  HCT 31.2* 37.6  PLT 164 207    COAGS: Recent Labs    07/18/23 1313  INR 1.3*    BMP: No results for input(s): NA, K, CL, CO2, GLUCOSE, BUN, CALCIUM , CREATININE, GFRNONAA, GFRAA in the last 8760 hours.  Invalid input(s): CMP  LIVER FUNCTION TESTS: Recent Labs    07/18/23 1313  BILITOT 1.0  AST 53*  ALT 29  ALKPHOS 215*  PROT 6.7  ALBUMIN  3.3*    TUMOR MARKERS: Recent Labs    07/18/23 1313  AFPTM 5.1    Assessment and Plan:  Pleasant 73 year old female with a long history of chronic back pain which has been exacerbated over the past several weeks.  She has a history of prior cement augmentation at L3, L4 and L5.  Her pain seems to be lower, perhaps within the sacrum.  It is unclear if she has a new sacral insufficiency fracture or a bulged disc at L5-S1.  Imaging is required for further discrimination.  1.)  MRI lumbar spine without gadolinium followed by a follow-up clinic visit.  If she has sacral insufficiency fractures, sacroplasty would likely be warranted.  If instead she has new disc or facet disease that is causing her pain, steroid injections may be warranted.  MRI completed on 03/10/24 and reveals an acute/subacute fracture at L1.  No sacral fracture.  Her pain is presumably referred from this acute and unhealed L1 fracture.  She is a candidate for L1 cement augmentation. Roland-Morris disability score 21/24.   - Please schedule for L1 cement augmentation with balloon kyphoplasty   Thank you for this interesting consult.  I greatly enjoyed meeting Emma Lewis and look forward to participating in their care.  A copy of this report was sent to the requesting provider on this date.  Electronically Signed: Wilkie MARLA Lent 03/09/2024, 1:37 PM   I spent a total of  40 Minutes  in face to face in clinical consultation, greater than 50% of which was counseling/coordinating care for low back pain.

## 2024-03-10 ENCOUNTER — Ambulatory Visit
Admission: RE | Admit: 2024-03-10 | Discharge: 2024-03-10 | Disposition: A | Discharge status: Home / Self Care | Source: Ambulatory Visit | Attending: Interventional Radiology | Admitting: Interventional Radiology

## 2024-03-10 DIAGNOSIS — M47816 Spondylosis without myelopathy or radiculopathy, lumbar region: Secondary | ICD-10-CM | POA: Diagnosis not present

## 2024-03-10 DIAGNOSIS — M5136 Other intervertebral disc degeneration, lumbar region with discogenic back pain only: Secondary | ICD-10-CM | POA: Diagnosis not present

## 2024-03-10 DIAGNOSIS — M545 Low back pain, unspecified: Secondary | ICD-10-CM | POA: Diagnosis not present

## 2024-03-10 DIAGNOSIS — M4856XA Collapsed vertebra, not elsewhere classified, lumbar region, initial encounter for fracture: Secondary | ICD-10-CM | POA: Diagnosis not present

## 2024-03-10 DIAGNOSIS — G8929 Other chronic pain: Secondary | ICD-10-CM | POA: Insufficient documentation

## 2024-03-10 DIAGNOSIS — M48061 Spinal stenosis, lumbar region without neurogenic claudication: Secondary | ICD-10-CM | POA: Diagnosis not present

## 2024-03-10 DIAGNOSIS — T8452XD Infection and inflammatory reaction due to internal left hip prosthesis, subsequent encounter: Secondary | ICD-10-CM | POA: Diagnosis not present

## 2024-03-10 LAB — COMPLETE METABOLIC PANEL WITHOUT GFR
AG Ratio: 1 (calc) (ref 1.0–2.5)
ALT: 31 U/L — ABNORMAL HIGH (ref 6–29)
AST: 54 U/L — ABNORMAL HIGH (ref 10–35)
Albumin: 3 g/dL — ABNORMAL LOW (ref 3.6–5.1)
Alkaline phosphatase (APISO): 247 U/L — ABNORMAL HIGH (ref 37–153)
BUN: 16 mg/dL (ref 7–25)
CO2: 24 mmol/L (ref 20–32)
Calcium: 9.1 mg/dL (ref 8.6–10.4)
Chloride: 110 mmol/L (ref 98–110)
Creat: 0.9 mg/dL (ref 0.60–1.00)
Globulin: 2.9 g/dL (ref 1.9–3.7)
Glucose, Bld: 92 mg/dL (ref 65–99)
Potassium: 3.6 mmol/L (ref 3.5–5.3)
Sodium: 140 mmol/L (ref 135–146)
Total Bilirubin: 0.9 mg/dL (ref 0.2–1.2)
Total Protein: 5.9 g/dL — ABNORMAL LOW (ref 6.1–8.1)

## 2024-03-10 LAB — CBC WITH DIFFERENTIAL/PLATELET
Absolute Lymphocytes: 1956 {cells}/uL (ref 850–3900)
Absolute Monocytes: 662 {cells}/uL (ref 200–950)
Basophils Absolute: 69 {cells}/uL (ref 0–200)
Basophils Relative: 0.9 %
Eosinophils Absolute: 524 {cells}/uL — ABNORMAL HIGH (ref 15–500)
Eosinophils Relative: 6.8 %
HCT: 35.9 % (ref 35.0–45.0)
Hemoglobin: 11.8 g/dL (ref 11.7–15.5)
MCH: 32.8 pg (ref 27.0–33.0)
MCHC: 32.9 g/dL (ref 32.0–36.0)
MCV: 99.7 fL (ref 80.0–100.0)
MPV: 11 fL (ref 7.5–12.5)
Monocytes Relative: 8.6 %
Neutro Abs: 4489 {cells}/uL (ref 1500–7800)
Neutrophils Relative %: 58.3 %
Platelets: 162 Thousand/uL (ref 140–400)
RBC: 3.6 Million/uL — ABNORMAL LOW (ref 3.80–5.10)
RDW: 14.2 % (ref 11.0–15.0)
Total Lymphocyte: 25.4 %
WBC: 7.7 Thousand/uL (ref 3.8–10.8)

## 2024-03-10 LAB — CP4508-PT/INR AND PTT
INR: 1.2 — ABNORMAL HIGH
Prothrombin Time: 13 s — ABNORMAL HIGH (ref 9.0–11.5)
aPTT: 31 s (ref 23–32)

## 2024-03-10 LAB — HOUSE ACCOUNT TRACKING

## 2024-03-10 NOTE — Progress Notes (Signed)
 Infectious Diseases Clinic  Location Information: Patient State (at time of visit): [place]  Patient Location (at time of visit):Home/Other Non-Medical  Provider Location: Hospital/Provider-Based Clinic Is provider licensed to provide clinical care in the current location/state of the patient? Yes   Consent:  Patient's identity was confirmed. Presenting condition or illness was discussed with the patient/personal representative. Current proposed treatment for presenting condition or illness was explained to patient/personal representative along with the likely benefits and any significant risks or complications associated with the provision of treatment by audio/video means. The patient/personal representative verbally authorized treatment to be provided by audio/video, which may include a limited review of patient's current health status, medication, or other treatment recommendations, patient education, and an opportunity to ask questions about condition and treatment. Verbal Consent Granted by Patient/Personal Representative:Yes   Visit Information: Modality: Audio-Only  Time Spent on Phone w/ Patient: 16:40  Name: Emma Lewis  MRN: [id number] Provider: Harlene Christobal Dixons, MD  Encounter Date: 03/10/2024  HPI :   Emma Lewis is a 73 y.o. year old female with a h/o left hip PJI as outlined below:  Patient had a left total replacement 2011 revised in 2017 with Justin acetabular revision to a new shell and liner because of a dislocated polyethylene liner on the original cup. She underwent a right total replacement in June 2019. 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 The femoral stem she had at that time had been recalled due to metallosis so she on went a second revision on the left hip in October 2019 both the acetabulum and the femur were removed and new DePuy  implants were placed including a new GRIPTION cup and a new AML stem. 2 weeks postop from that revision when she had increased pain and she was found to have a periprosthetic femoral fracture with hip subluxation/dislocation. She underwent a open reduction and fixation of her entire femur with a periprosthetic plate wires and screws in November 2019. A few weeks postop she developed drainage and was brought back to the operating room for evacuation of a hematoma and wound VAC placement with antibiotics. The patient then had femoral stem subsidence and a hip dislocation and in February 2020 underwent an additional revision with removal of her AML stem which was then cleaned and reimplanted with a cement mantle.     -Patient went to OR 10/09/23 for   PREOPERATIVE DIAGNOSES: 1.  Left revision hip replacement with superficial abscess and concerns of deep infection.   POSTOPERATIVE DIAGNOSES:   1.  Left revision hip replacement with superficial abscess and concerns of deep infection.   OPERATIVE PROCEDURES:   1.  Aspiration of hip joint under fluoroscopic imaging. 2.  Sharp excisional debridement of soft tissue ulcer measuring 7 cm x 3 cm x 4 cm deep.   -Patient was discharged with amox-clav -Culture from 10/09/23 grew Stap epidermidis and patient was started on Clindamycin  300mg  TID was started 10/22/23, completed 6 weeks -was started on doxycycline  suppression in ID clinic on 12/03/23  TODAY -patient says her back is out for about one month. She is having severe pain with any movement. Having trouble getting to the bathroom  -left hip incision remains healed. Hip is pain free. She is tolerating doxycycline  well. Is aware of need to drink full glass of water  to prevent pill esophagitis and caution against sunburn  PMH  The PMH,  PSH, FH, and SH were reviewed and updated by me as appropriate on 03/10/2024.   Meds and Allergies:   Allergies[1]  Medications Ordered Prior to Encounter[2]  ROS :    Review of Systems    LABS:  CBC Lab Results  Component Value Date   WBC 4.70 08/26/2023   HGB 12.8 08/26/2023   PLT 170 08/26/2023   CMP   Chemistry      Component Value Date/Time   NA 142 08/26/2023 1150   K 3.6 08/26/2023 1150   CL 111 (H) 08/26/2023 1150   CO2 26 08/26/2023 1150   BUN 19 08/26/2023 1150   CREATININE 0.79 08/26/2023 1150      Component Value Date/Time   CALCIUM  9.6 08/26/2023 1150   AST 43 (H) 08/26/2023 1150   ALT 24 08/26/2023 1150   BILITOT 0.8 08/26/2023 1150       Inflammatory Markers Sedimentation Rate  Date Value Ref Range Status  08/26/2023 35 (H) 0 - 30 mm/hr Final    MICRO   TISSUE LEFT FEMORAL CANAL    Special Requests B  Gram Stain RARE WBC PRESENT, PREDOMINANTLY PMN NO ORGANISMS SEEN  Culture NO GROWTH 2 DAYS Performed at [facility], [address] [place], [place] [address]  Report Status 09/23/2018 FINAL        Component 5 yr ago  MICRO NUMBER: [id number]  SPECIMEN QUALITY: Adequate  Source: LEFT HIP WOUND  STATUS: FINAL  GRAM STAIN: Few White blood cells seen No epithelial cells seen Few Gram positive cocci in pairs  ANA RESULT: No anaerobes isolated.  MICRO NUMBER: [id number]  SPECIMEN QUALITY: Adequate  SOURCE: LEFT HIP WOUND  STATUS: FINAL  AER RESULT: Growth of skin flora (note: Growth does not include S. aureus, beta-hemolytic Streptococci or P. aeruginosa).  Resulting Agency [facility] <redacted file path>  Specimen Collected: 06/17/18 13:18    Performed by: [facility] <redacted file path> Last Resulted: 06/23/18 08:33    MICRO NUMBER: [id number]  SPECIMEN QUALITY: ADEQUATE  Source: FLUID, SYNOVIAL  STATUS: FINAL  GRAM STAIN: No white blood cells seen No organisms seen  ANA RESULT: No anaerobes isolated.  MICRO NUMBER: [id number]  SPECIMEN QUALITY: ADEQUATE  SOURCE: FLUID, SYNOVIAL  STATUS: FINAL  AER RESULT: No Growth  Resulting Agency [facility] <redacted file path>  Specimen Collected: 04/21/18 16:04     Collected 10/09/2023 07:33     Status: Final result   Test Result Released: Yes (seen)   Specimen Information: Hip, Left; Synovial Fluid  0 Result Notes Synovial Culture Light Growth Staphylococcus epidermidis Abnormal      Unable to continue extended incubation for Propionibacteria due to overgrowth of other organisms.         Gram Stain Result 4+ PMN  No organisms seen        Resulting Agency: [facility]    Susceptibility           Staphylococcus epidermidis      MIC      Ampicillin 4 ug/ml Resistant *      Cefazolin  <=4 ug/ml Resistant *      Ceftriaxone  <=4 ug/ml Resistant *      Chloramphenicol <=8 ug/ml Susceptible *      Ciprofloxacin  >2 ug/ml Resistant *      Clindamycin  <=0.25 ug/ml Susceptible 1      Daptomycin <=0.25 ug/ml Susceptible *      Erythromycin >4 ug/ml Resistant      Gentamicin <=1 ug/ml Susceptible  Inducible Clindamycin  Resistance NEG ug/ml Negative *      Levofloxacin >4 ug/ml Resistant *      Linezolid <=0.5 ug/ml Susceptible *      Moxifloxacin >4 ug/ml Resistant *      Oxacillin >2 ug/ml Resistant 2      Penicillin >8 ug/ml Resistant *      Rifampin <=1 ug/ml Susceptible *      Tetracycline <=2 ug/ml Susceptible *      Tigecycline <=0.12 ug/ml Susceptible *      Trimethoprim + Sulfamethoxazole <=0.5/9.5 u... Susceptible *      Vancomycin  1 ug/ml Susceptible                 IMAGING:  CT FEMUR/THIGH LEFT WITHOUT CONTRAST, 08/29/2023 1:43 PM  IMPRESSION: 1.  Degraded study by metallic streak artifacts and absence of contrast, within the limitation of assessment; no drainable fluid collection. 2.  Healing left femoral periprosthetic fracture status post revision total hip arthroplasty. Intact hardware. 3.  No evidence for osteomyelitis. ASSESSMENT and RECOMMENDATIONS:   1. Infection associated with internal left hip prosthesis, subsequent encounter (Primary) -Staph epidermidis is the organism, which is susceptible to  doxycycline  -patient tolerating doxy well -plan to continue this. Will re-evaluate in 6 months -refil   The total time for this service was 20 minutes, and included time spent reviewing past medical records (precharting), face-to-face encounter, and post encounter documentation/coordination of care.       [1] Allergies Allergen Reactions  . Budesonide  Shortness Of Breath and Swelling  . Sulfamethoxazole Other (See Comments)    Reaction happened as in infant  . Adhesive Itching and Rash    Blisters from bandages  [2] Current Outpatient Medications on File Prior to Visit  Medication Sig Dispense Refill  . acyclovir  (ZOVIRAX ) 400 mg tablet Take 400 mg by mouth daily.    SABRA atorvastatin (LIPITOR) 40 mg tablet Take 1 tablet by mouth daily.    . calcium  carbonate-vitamin D3 600 mg-5 mcg (200 unit) tab Take 1 tablet by mouth 2 (two) times a day.    . cholecalciferol  (VITAMIN D3) 2,000 unit cap capsule Take 1 capsule by mouth 3 (three) times a day.    . cyanocobalamin , vitamin B-12, 1,000 mcg ER tablet Take 1 tablet by mouth Once Daily.    . doxycycline  (VIBRAMYCIN ) 100 mg capsule Take 1 capsule (100 mg total) by mouth 2 (two) times a day. Take with 8 oz water . Do not lie down for at least 30 minutes after. Start this when clindamycin  is finished 60 capsule 5  . ibuprofen  (MOTRIN ) 200 mg tablet Take 800 mg by mouth 2 (two) times a day.    . levothyroxine  (SYNTHROID ) 75 mcg tablet Take 75 mcg by mouth every morning.    . omeprazole  magnesium  (PriLOSEC ) 20 mg EC tablet Take 10 mg by mouth daily.    . oxyCODONE -acetaminophen  (PERCOCET) 5-325 mg per tablet Take 1 tablet by mouth every 6 (six) hours as needed for moderate pain (4-6). 24 tablet 0  . UNABLE TO FIND Nutrifol- Take 4 capsules by mouth once daily    . VITAMIN K2 ORAL Take 1 tablet by mouth daily.     No current facility-administered medications on file prior to visit.

## 2024-03-11 ENCOUNTER — Other Ambulatory Visit: Payer: Self-pay | Admitting: Interventional Radiology

## 2024-03-11 DIAGNOSIS — M8008XA Age-related osteoporosis with current pathological fracture, vertebra(e), initial encounter for fracture: Secondary | ICD-10-CM

## 2024-03-15 ENCOUNTER — Telehealth: Payer: Self-pay

## 2024-03-15 NOTE — Discharge Instructions (Signed)
 Kyphoplasty Post Procedure Discharge Instructions  May resume a regular diet and any medications that you routinely take (including pain medications). However, if you are taking Aspirin  or an anticoagulant/blood thinner you will be told when you can resume taking these by the healthcare provider. No driving day of procedure. The day of your procedure take it easy. You may use an ice pack as needed to injection sites on back.  Ice to back 30 minutes on and 30 minutes off, as needed. May remove bandaids tomorrow after taking a shower. Replace daily with a clean bandaid until healed.  Do not lift anything heavier than a milk jug for 1-2 weeks or determined by your physician.  Follow up with your physician in 2 weeks.  If you need to speak to someone after hours, please call the on call IR physician at [phone].  Tell them you are a patient of Dr. Karalee and that you had a Kyphoplasty today and the issues you are having.   Please contact our office at [phone] for the following symptoms or if you have any questions:  Fever greater than 100 degrees Increased swelling, pain, or redness at injection site. Increased back and/or leg pain New numbness or change in symptoms from before the procedure.    Thank you for visiting [facility] [place]!Kyphoplasty Post Procedure Discharge Instructions  May resume a regular diet and any medications that you routinely take (including pain medications). However, if you are taking Aspirin  or an anticoagulant/blood thinner you will be told when you can resume taking these by the healthcare provider. No driving day of procedure. The day of your procedure take it easy. You may use an ice pack as needed to injection sites on back.  Ice to back 30 minutes on and 30 minutes off, as needed. May remove bandaids tomorrow after taking a shower. Replace daily with a clean bandaid until healed.  Do not lift anything heavier than a milk jug for 1-2 weeks or determined  by your physician.  Follow up with your physician in 2 weeks.  If you need to speak to someone after hours, please call the on call IR physician at [phone].  Tell them you are a patient of Dr. Karalee and that you had a Kyphoplasty today and the issues you are having.   Please contact our office at [phone] for the following symptoms or if you have any questions:  Fever greater than 100 degrees Increased swelling, pain, or redness at injection site. Increased back and/or leg pain New numbness or change in symptoms from before the procedure.    Thank you for visiting [facility] [place]!

## 2024-03-17 ENCOUNTER — Telehealth: Payer: Self-pay

## 2024-03-18 ENCOUNTER — Inpatient Hospital Stay
Admission: RE | Admit: 2024-03-18 | Discharge: 2024-03-18 | Discharge status: Home / Self Care | Source: Ambulatory Visit | Attending: Interventional Radiology | Admitting: Interventional Radiology

## 2024-03-18 DIAGNOSIS — M8008XA Age-related osteoporosis with current pathological fracture, vertebra(e), initial encounter for fracture: Secondary | ICD-10-CM

## 2024-03-18 HISTORY — PX: IR KYPHO LUMBAR INC FX REDUCE BONE BX UNI/BIL CANNULATION INC/IMAGING: IMG5519

## 2024-03-18 MED ORDER — MIDAZOLAM HCL 2 MG/2ML IJ SOLN
INTRAMUSCULAR | Status: AC | PRN
  Administered 2024-03-18 (×2): 1 mg via INTRAVENOUS

## 2024-03-18 MED ORDER — LIDOCAINE HCL (PF) 1 % IJ SOLN
10.0000 mL | Freq: Once | INTRAMUSCULAR | Status: AC
  Administered 2024-03-18: 10 mL via INTRADERMAL

## 2024-03-18 MED ORDER — SODIUM CHLORIDE 0.9 % IV SOLN: INTRAVENOUS | Status: DC

## 2024-03-18 MED ORDER — CEFAZOLIN SODIUM-DEXTROSE 2-4 GM/100ML-% IV SOLN
2.0000 g | INTRAVENOUS | Status: AC
  Administered 2024-03-18: 2 g via INTRAVENOUS

## 2024-03-18 MED ORDER — FENTANYL CITRATE PF 50 MCG/ML IJ SOSY: 25.0000 ug | PREFILLED_SYRINGE | INTRAMUSCULAR | Status: DC | PRN

## 2024-03-18 MED ORDER — ACETAMINOPHEN 10 MG/ML IV SOLN
1000.0000 mg | Freq: Once | INTRAVENOUS | Status: AC
  Administered 2024-03-18: 1000 mg via INTRAVENOUS

## 2024-03-18 MED ORDER — FENTANYL CITRATE (PF) 100 MCG/2ML IJ SOLN
INTRAMUSCULAR | Status: AC | PRN
  Administered 2024-03-18 (×4): 50 ug via INTRAVENOUS

## 2024-03-18 MED ORDER — MIDAZOLAM HCL 2 MG/2ML IJ SOLN: 1.0000 mg | INTRAMUSCULAR | Status: DC | PRN

## 2024-03-18 MED ORDER — ONDANSETRON HCL 4 MG/2ML IJ SOLN
8.0000 mg | Freq: Once | INTRAMUSCULAR | Status: AC
  Administered 2024-03-18: 8 mg via INTRAVENOUS

## 2024-03-18 NOTE — Progress Notes (Signed)
 Pt back in nursing recovery area. Pt alert. Pt follows commands, talks in complete sentences and has no complaints at this time. Pt will remain in nurses station until discharged by Radiologist.

## 2024-03-19 ENCOUNTER — Telehealth: Payer: Self-pay

## 2024-03-25 ENCOUNTER — Telehealth: Payer: Self-pay

## 2024-03-26 DIAGNOSIS — I8510 Secondary esophageal varices without bleeding: Secondary | ICD-10-CM | POA: Diagnosis not present

## 2024-03-26 DIAGNOSIS — K7460 Unspecified cirrhosis of liver: Secondary | ICD-10-CM | POA: Diagnosis not present

## 2024-03-26 DIAGNOSIS — K219 Gastro-esophageal reflux disease without esophagitis: Secondary | ICD-10-CM | POA: Diagnosis not present

## 2024-03-26 DIAGNOSIS — E039 Hypothyroidism, unspecified: Secondary | ICD-10-CM | POA: Diagnosis not present

## 2024-03-26 DIAGNOSIS — L989 Disorder of the skin and subcutaneous tissue, unspecified: Secondary | ICD-10-CM | POA: Diagnosis not present

## 2024-03-26 DIAGNOSIS — E785 Hyperlipidemia, unspecified: Secondary | ICD-10-CM | POA: Diagnosis not present

## 2024-03-26 DIAGNOSIS — M810 Age-related osteoporosis without current pathological fracture: Secondary | ICD-10-CM | POA: Diagnosis not present

## 2024-03-26 DIAGNOSIS — R0600 Dyspnea, unspecified: Secondary | ICD-10-CM | POA: Diagnosis not present

## 2024-03-26 DIAGNOSIS — R600 Localized edema: Secondary | ICD-10-CM | POA: Diagnosis not present

## 2024-03-30 DIAGNOSIS — R600 Localized edema: Secondary | ICD-10-CM | POA: Diagnosis not present

## 2024-03-30 DIAGNOSIS — R0600 Dyspnea, unspecified: Secondary | ICD-10-CM | POA: Diagnosis not present

## 2024-03-31 ENCOUNTER — Other Ambulatory Visit: Payer: Self-pay | Admitting: Interventional Radiology

## 2024-03-31 DIAGNOSIS — M549 Dorsalgia, unspecified: Secondary | ICD-10-CM

## 2024-03-31 DIAGNOSIS — S32010G Wedge compression fracture of first lumbar vertebra, subsequent encounter for fracture with delayed healing: Secondary | ICD-10-CM

## 2024-04-01 ENCOUNTER — Ambulatory Visit
Admission: RE | Admit: 2024-04-01 | Discharge: 2024-04-01 | Disposition: A | Discharge status: Home / Self Care | Source: Ambulatory Visit | Attending: Interventional Radiology | Admitting: Interventional Radiology

## 2024-04-01 DIAGNOSIS — M545 Low back pain, unspecified: Secondary | ICD-10-CM | POA: Diagnosis not present

## 2024-04-01 DIAGNOSIS — S32010G Wedge compression fracture of first lumbar vertebra, subsequent encounter for fracture with delayed healing: Secondary | ICD-10-CM

## 2024-04-01 NOTE — Progress Notes (Signed)
 Chief Complaint: Low back pain.  Referring Provider(s): McCullough,Heath K     Patient Status: [facility].  History of Present Illness: Emma Lewis is a 73 y.o. female Presents today with bilaterally symmetric low back pain.  The patient was recently diagnosed with acute symptomatic L1 osteoporotic fracture and treated with kyphoplasty on 03/18/2024.  Postprocedure the patient was doing well in symptoms were resolved.  Patient has a history of previous L3, L4, and L5 kyphoplasty in the remote past.  As part of routine follow-up, phone call placed to the patient on 03/26/2024 symptoms were resolved.  The patient presents today with new onset of back pain in the low lumbar/sacral region tender to palpation and without lateralizing symptoms.  Patient is currently having some difficulty walking and it is hard to determine if this is from her left leg swelling for from the back pain.  The patient is able to stand and walk however it is uncomfortable for her.  The patient began having significant left greater than right leg swelling and foot swelling.  Patient is wearing different shoes and can no longer wear jeans secondary to the significant increase in size of her left leg and foot.  Patient does take Lasix and is being worked up for cardiogenic abnormality (recent echocardiogram) was performed and the patient is following up with cardiology tomorrow 04/02/2024.     Physical exam demonstrates moderate tenderness in the L5/S1 region bilaterally equal.  Patient also has a history of kidney stones, however has been urinating without problem.  And no real flank discomfort.     We discussed low-back pain as being potential muscle spasm by involving herself in more activities since for resolution of back pain secondary to the fracture versus a potential new sacral insufficiency fracture.  Currently we will hold off on order MRI of the sacrum.  The patient will begin applying heat on a  daily basis to the low back as well as will obtain over the counter compression stockings for her leg swelling.  Patient will also follow-up with cardiology for potential cardiogenic abnormality.  Patient will call our office by 04/09/2024 for an update.  If her symptoms are slowly resolving or gone no further workup necessary.  If her pain is exacerbated (she will call us  sooner if her pain becomes worsened significantly) we will consider MRI of the sacrum and workup for a sacral insufficiency fracture.  Further workup for venous reflux could be performed as the patient has had significant trauma and surgery to the left femoral region and could have underlying left great saphenous vein venous reflux.]     Past Medical History:  Diagnosis Date   Allergies    Arthritis    all over my body (04/29/2018)   Chronic lower back pain    Fatty liver    Fever blister    Takes Acyclovir    GERD (gastroesophageal reflux disease)    Hyperlipemia    Hypothyroidism    Kidney stones    Migraines    Osteopenia    Pneumonia    once; years ago (04/29/2018)   PONV (postoperative nausea and vomiting)    dry heaves   Pre-diabetes    Seasonal allergies    Vertigo     Past Surgical History:  Procedure Laterality Date   ANTERIOR CERVICAL DECOMP/DISCECTOMY FUSION N/A 02/09/2014   Procedure: ANTERIOR CERVICAL DECOMPRESSION/DISCECTOMY FUSION 2 LEVELS;  Surgeon: Oneil JAYSON Herald, MD;  Location: [facility] OR;  Service: Orthopedics;  Laterality: N/A;  C4-5, C5-6 Anterior Cervical Discectomy and Fusion, Allograft, Plate   BACK SURGERY     CARPAL TUNNEL RELEASE Right 2018   CATARACT EXTRACTION W/ INTRAOCULAR LENS  IMPLANT, BILATERAL     COLONOSCOPY     FIXATION KYPHOPLASTY LUMBAR SPINE  X 2   HIP ARTHROPLASTY Left 2011   INCISION AND DRAINAGE HIP Left 06/19/2018   Procedure: IRRIGATION AND DEBRIDEMENT LEFT HIP HEMATOMA ANTIBIOTIC BEADS, APPLICATION ON WOUND VAC;  Surgeon: Barbarann Oneil BROCKS, MD;  Location: [facility] OR;  Service:  Orthopedics;  Laterality: Left;   IR KYPHO LUMBAR INC FX REDUCE BONE BX UNI/BIL CANNULATION INC/IMAGING  03/18/2024   IR RADIOLOGIST EVAL & MGMT  03/09/2024   JOINT REPLACEMENT     KNEE ARTHROSCOPY Right    ORIF PERIPROSTHETIC FRACTURE Left 06/01/2018   Procedure: OPEN REDUCTION INTERNAL FIXATION (ORIF) PERIPROSTHETIC FEMUR FRACTURE;  Surgeon: Barbarann Oneil BROCKS, MD;  Location: [facility] OR;  Service: Orthopedics;  Laterality: Left;   PATELLA FRACTURE SURGERY Right ~ 1990   put metal in   PATELLA HARDWARE REMOVAL Right    took the metal out   REVISION TOTAL HIP ARTHROPLASTY Left 04/29/2018   ROBOTIC ASSISTED BILATERAL SALPINGO OOPHERECTOMY Bilateral 08/01/2015   Procedure: ROBOTIC ASSISTED BILATERAL SALPINGO OOPHORECTOMY;  Surgeon: Maurilio Ship, MD;  Location: [facility];  Service: Gynecology;  Laterality: Bilateral;   SHOULDER OPEN ROTATOR CUFF REPAIR Right    3 TOTAL   SHOULDER SURGERY Right    TOTAL HIP ARTHROPLASTY Right 01/21/2018   Procedure: RIGHT TOTAL HIP ARTHROPLASTY DIRECT ANTERIOR;  Surgeon: Barbarann Oneil BROCKS, MD;  Location: [facility] OR;  Service: Orthopedics;  Laterality: Right;   TOTAL HIP REVISION Left 06/10/2016   Procedure: TOTAL HIP REVISION ARTHROPLASTY;  Surgeon: Redell Shoals, MD;  Location: [facility] OR;  Service: Orthopedics;  Laterality: Left;   TOTAL HIP REVISION Left 04/29/2018   Procedure: left total hip arthroplasty revision posterior approach;  Surgeon: Barbarann Oneil BROCKS, MD;  Location: [facility] OR;  Service: Orthopedics;  Laterality: Left;   TOTAL HIP REVISION Left 09/21/2018   Procedure: left total hip arthroplasty femoral revision;  Surgeon: Barbarann Oneil BROCKS, MD;  Location: [facility] OR;  Service: Orthopedics;  Laterality: Left;   WRIST GANGLION EXCISION Left 1970s    Allergies: Entocort ec  [budesonide ], Sulfa antibiotics, and Tape  Medications: Prior to Admission medications   Medication Sig Start Date End Date Taking? Authorizing Provider  acyclovir  (ZOVIRAX ) 400 MG tablet Take 1 tablet (400 mg total) by  mouth 2 (two) times daily. 05/15/18   Angiulli, Toribio PARAS, PA-C  AMBULATORY NON FORMULARY MEDICATION Nutrafol Take 4 capsule by mouth daily    [provider]  AMBULATORY NON FORMULARY MEDICATION Beet Root Take 2 capsule by mouth daily Patient not taking: Reported on 03/09/2024    [provider]  AMBULATORY NON FORMULARY MEDICATION Aspercreme Patch 1 patch topical daily Patient not taking: Reported on 03/09/2024    [provider]  atorvastatin (LIPITOR) 40 MG tablet Take 40 mg by mouth daily.    [provider]  B-D UF III MINI PEN NEEDLES 31G X 5 MM MISC Inject into the skin as directed. Patient not taking: Reported on 10/17/2022 03/26/22   [provider]  Calcium  Carb-Cholecalciferol  (CALCIUM  600 + D PO) Take 1 tablet by mouth 3 (three) times daily.     [provider]  Cholecalciferol  (VITAMIN D3) 2000 units TABS Take 2,000 Units by mouth 2 (two) times daily.     [provider]  Coenzyme Q10 (  CO Q10) 100 MG CAPS Take 1 capsule by mouth daily.    [provider]  Cyanocobalamin  (VITAMIN B-12) 2500 MCG SUBL Place 1 tablet under the tongue daily.    [provider]  diphenoxylate -atropine  (LOMOTIL ) 2.5-0.025 MG tablet TAKE 1-3 TABLETS BY MOUTH 4 TIMES DAILY AS NEEDED FOR  DIARRHEA  OR  LOOSE  STOOLS Patient not taking: Reported on 03/09/2024 06/03/22   Mansouraty, Aloha Raddle., MD  ibuprofen  (ADVIL ) 200 MG tablet Take 800 mg by mouth 2 (two) times daily.    [provider]  levothyroxine  (EUTHYROX ) 100 MCG tablet Take 75 mcg by mouth daily before breakfast.    [provider]  Menaquinone-7 (VITAMIN K2) 100 MCG CAPS Take 1 capsule by mouth daily.    [provider]  nadolol  (CORGARD ) 20 MG tablet Take 1/2 tab (10 mg) for 1 week then increase to 1 tab (20 mg) daily Patient not taking: Reported on 03/09/2024 05/22/22   Mansouraty, Aloha Raddle., MD  omeprazole  (PRILOSEC  OTC) 20 MG tablet Take 10  mg by mouth daily.    [provider]  Peppermint Oil (IBGARD) 90 MG CPCR Take by mouth. Take 2 capsules by mouth in the mornings and 1-2 tablets at night Patient not taking: Reported on 03/09/2024    [provider]  Turmeric Curcumin 500 MG CAPS Take 1 capsule by mouth 2 (two) times daily.    [provider]     Family History  Problem Relation Age of Onset   Lymphoma Mother    Arthritis Mother    Hyperlipidemia Mother    Colon polyps Father    Diabetes Father    Cirrhosis Father    Hyperlipidemia Father    Colon cancer Neg Hx    Stomach cancer Neg Hx    Esophageal cancer Neg Hx    Inflammatory bowel disease Neg Hx    Liver disease Neg Hx    Pancreatic cancer Neg Hx    Rectal cancer Neg Hx     Social History   Socioeconomic History   Marital status: Married    Spouse name: Not on file   Number of children: 1   Years of education: Not on file   Highest education level: Not on file  Occupational History   Not on file  Tobacco Use   Smoking status: Never   Smokeless tobacco: Never  Vaping Use   Vaping status: Never Used  Substance and Sexual Activity   Alcohol  use: Not Currently    Comment: `   Drug use: Never   Sexual activity: Not Currently  Other Topics Concern   Not on file  Social History Narrative   Not on file   Social Drivers of Health   Financial Resource Strain: Low Risk  (11/10/2023)   Received from [facility]   Overall Financial Resource Strain (CARDIA)    Difficulty of Paying Living Expenses: Not hard at all  Food Insecurity: Patient Declined (11/07/2023)   Received from [facility]   Hunger Vital Sign    Within the past 12 months, you worried that your food would run out before you got the money to buy more.: Patient declined    Within the past 12 months, the food you bought just didn't last and you didn't have money to get more.: Patient declined  Transportation Needs: No  Transportation Needs (11/07/2023)   Received from [facility] - Transportation    In  the past 12 months, has lack of transportation kept you from medical appointments or from getting medications?: No    Lack of Transportation (Non-Medical): No  Physical Activity: Not on file  Stress: Not on file  Social Connections: Not on file     Review of Systems: A 12 point ROS discussed and pertinent positives are indicated in the HPI above.  All other systems are negative.  Review of Systems  Vital Signs: BP (!) 156/63 (BP Location: Left Arm, Patient Position: Sitting, Cuff Size: Normal)   Pulse 76   Temp 98.1 F (36.7 C) (Oral)   Resp 16   SpO2 97%      Physical Exam  Imaging: IR KYPHO LUMBAR INC FX REDUCE BONE BX UNI/BIL CANNULATION INC/IMAGING Result Date: 03/18/2024 CLINICAL DATA:  Highly symptomatic osteoporotic fracture of the L1 vertebral body. Patient has prior osteoporotic compression fractures at L3, L4 and L5 which have all undergone cement augmentation. She presents today for cement augmentation of the L1 vertebral body. EXAM: FLUOROSCOPIC GUIDED KYPHOPLASTY OF THE L1 VERTEBRAL BODY COMPARISON:  None Available. MEDICATIONS: As antibiotic prophylaxis, 2 g Ancef  was ordered pre-procedure and administered intravenously by the Radiology nurse within 1 hour of incision. Additionally, patient received 8 mg Zofran  and 1 g Tylenol  intravenously administered by the radiology nurse. ANESTHESIA/SEDATION: Moderate (conscious) sedation was employed during this procedure. A total of Versed  2 mg and Fentanyl  200 mcg was administered intravenously. Moderate Sedation Time: 26 minutes. The patient's level of consciousness and vital signs were monitored continuously by radiology nursing throughout the procedure under my direct supervision. FLUOROSCOPY TIME:  Radiation exposure index: 42.8 mGy, reference air kerma COMPLICATIONS: None immediate. PROCEDURE: The procedure, risks  (including but not limited to bleeding, infection, organ damage), benefits, and alternatives were explained to the patient. Questions regarding the procedure were encouraged and answered. The patient understands and consents to the procedure. The patient has suffered a fracture of the L1 vertebral body. Patient has previously undergone therapy for osteoporosis within the last 2 years. The patient was placed prone on the fluoroscopic table. The skin overlying the lumbar region was then prepped and draped in the usual sterile fashion. Maximal barrier sterile technique was utilized including caps, mask, sterile gowns, sterile gloves, sterile drape, hand hygiene and skin antiseptic. Intravenous Fentanyl  and Versed  were administered as conscious sedation during continuous cardiorespiratory monitoring by the radiology RN. The right pedicle at L1 was then infiltrated with 1% lidocaine  followed by the advancement of a Kyphon trocar needle through the left pedicle into the posterior one-third of the vertebral body. Subsequently, the osteo drill was advanced to the anterior third of the vertebral body. The osteo drill was retracted. Through the working cannula, a Kyphon inflatable bone tamp 15 x 2.5 was advanced and positioned with the distal marker approximately 5 mm from the anterior aspect of the cortex. Appropriate positioning was confirmed on the AP projection. At this time, the balloon was expanded using contrast via a Kyphon inflation syringe device via micro tubing. Inflations were continued until there was near apposition with the superior end plate. At this time, methylmethacrylate mixture was reconstituted in the Kyphon bone mixing device system. This was then loaded into the delivery mechanism, attached to Kyphon bone fillers. The balloons were deflated and removed followed by the instillation of methylmethacrylate mixture with excellent filling in the AP and lateral projections. No extravasation was noted in the  disk spaces or posteriorly into the spinal canal. No epidural venous contamination was seen. The  working cannulae and the bone filler were then retrieved and removed. Hemostasis was achieved with manual compression. The patient tolerated the procedure well without immediate postprocedural complication. IMPRESSION: 1. Technically successful L1 vertebral body augmentation using balloon kyphoplasty. 2. Patient previously underwent treatment for osteoporosis within the last 2 years. Electronically Signed   By: Wilkie Lent M.D.   On: 03/18/2024 10:58   MR LUMBAR SPINE WO CONTRAST Result Date: 03/11/2024 EXAM: MRI LUMBAR SPINE 03/10/2024 04:43:32 PM TECHNIQUE: Multiplanar multisequence MRI of the lumbar spine was performed without the administration of intravenous contrast. COMPARISON: MRI of the lumbar spine dated 04/24/2001. CLINICAL HISTORY: History of surgery 5 years ago; complains of chronic low back pain worse after laying flat for x-ray 2 weeks ago. FINDINGS: BONES AND ALIGNMENT: Mild compression fracture of L1, which appears to be acute or recent. The vertebral body has decreased in height centrally from approximately 16 mm to 13 mm. There is slight buckling of the anterior superior cortex of the vertebral body. The posterior wall is intact. Chronic compression deformities of T12, L3, L4, and L5. Status post vertebral augmentation at L3 and L4. SPINAL CORD: The conus medullaris terminates at the mid body of T12. SOFT TISSUES: A complex cystic lesion within the left adrenal gland measuring approximately 4 cm in diameter. It appears similar to the prior exam and has been worked up on a previous MRI of the abdomen performed 12/20/2022. L1-L2: Mild diffuse disc bulging and endplate ridging arthrosis with mild-to-moderate central spinal canal stenosis and bilateral lateral recess stenosis. L2-L3: No significant disc herniation. No spinal canal stenosis or neural foraminal narrowing. L3-L4: Diffuse disc bulging  and bilateral facet arthrosis, with mild central spinal canal stenosis. L4-L5: Mild left lateral recess stenosis. L5-S1: Mild bilateral facet hypertrophy. No significant spinal canal or neural foraminal stenosis. IMPRESSION: 1. Acute or recent mild compression fracture of L1 with slight buckling of the anterior superior cortex. The posterior wall is intact. 2. Chronic compression deformities of T12, L3, L4, and L5. Status post vertebral augmentation at L3 and L4. 3. Mild-to-moderate central spinal canal stenosis and bilateral lateral recess stenosis at L1-2. 4. Complex cystic lesion within the left adrenal gland, measuring approximately 4 cm in diameter, similar to the prior exam. Electronically signed by: timothy berrigan 03/11/2024 11:13 AM EDT RP Workstation: [id number]   IR Radiologist Eval & Mgmt Result Date: 03/09/2024 EXAM: NEW PATIENT OFFICE VISIT CHIEF COMPLAINT: SEE NOTE IN EPIC HISTORY OF PRESENT ILLNESS: SEE NOTE IN EPIC REVIEW OF SYSTEMS: SEE NOTE IN EPIC PHYSICAL EXAMINATION: SEE NOTE IN EPIC ASSESSMENT AND PLAN: SEE NOTE IN EPIC Electronically Signed   By: Wilkie Lent M.D.   On: 03/09/2024 13:35    Labs:  CBC: Recent Labs    08/11/23 1142 03/09/24 0000  WBC 7.1 7.7  HGB 12.7 11.8  HCT 37.6 35.9  PLT 207 162    COAGS: Recent Labs    07/18/23 1313 03/09/24 0000  INR 1.3* 1.2*  APTT  --  31    BMP: Recent Labs    03/09/24 0000  NA 140  K 3.6  CL 110  CO2 24  GLUCOSE 92  BUN 16  CALCIUM  9.1  CREATININE 0.90    LIVER FUNCTION TESTS: Recent Labs    07/18/23 1313 03/09/24 0000  BILITOT 1.0 0.9  AST 53* 54*  ALT 29 31*  ALKPHOS 215*  --   PROT 6.7 5.9*  ALBUMIN  3.3*  --     TUMOR MARKERS: Recent Labs  07/18/23 1313  AFPTM 5.1    Assessment and Plan:  Muscle spasm vs new sacral insufficiency fracture.  No pain with sitting and no lateralizing symptoms.  I favor muscle spasm.  She will be in contact no later than than 9/12 to seehow her  symptoms are evolving.  If worsening will consider MRI sacrum.  OTC compression stockings for L>R leg swelling.    Electronically Signed: Cordella DELENA Banner, MD   04/01/2024, 4:22 PM     I spent a total of   25 Minutes in face to face in clinical consultation, greater than 50% of which was counseling/coordinating care for low back pain and leg swelling.

## 2024-04-02 DIAGNOSIS — I519 Heart disease, unspecified: Secondary | ICD-10-CM | POA: Diagnosis not present

## 2024-04-02 DIAGNOSIS — E039 Hypothyroidism, unspecified: Secondary | ICD-10-CM | POA: Diagnosis not present

## 2024-04-02 DIAGNOSIS — K7460 Unspecified cirrhosis of liver: Secondary | ICD-10-CM | POA: Diagnosis not present

## 2024-04-05 NOTE — Telephone Encounter (Signed)
 Call placed to patient (2 identifiers used.) Appointment changed to phone visit.

## 2024-04-05 NOTE — Telephone Encounter (Signed)
 Call placed to patient, patient scheduled for 04/14/2024 at 1130 for virtual visit. Patient verbalized understanding and repeated back details. Patient states she does not usually do video visit usually it is just over the telephone. Just wanted to clarify if that is ok.   Dr. Malvina: Please Advise.

## 2024-04-05 NOTE — Telephone Encounter (Signed)
 ID ATTENDING NOTE  Please call patient to schedule a virtual visit with me on 9/17 at 11:30 or at some point on that day.  Thank you Harlene Christobal Dixons, MD

## 2024-04-05 NOTE — Telephone Encounter (Signed)
 ID ATTENDING NOTE  Yes phone visit is fine.  Thank you Harlene Christobal Dixons, MD

## 2024-04-09 ENCOUNTER — Telehealth: Payer: Self-pay

## 2024-04-14 DIAGNOSIS — Z96649 Presence of unspecified artificial hip joint: Secondary | ICD-10-CM | POA: Diagnosis not present

## 2024-04-22 ENCOUNTER — Other Ambulatory Visit: Payer: Self-pay | Admitting: Interventional Radiology

## 2024-04-22 DIAGNOSIS — M545 Low back pain, unspecified: Secondary | ICD-10-CM

## 2024-04-27 DIAGNOSIS — C7492 Malignant neoplasm of unspecified part of left adrenal gland: Secondary | ICD-10-CM | POA: Diagnosis not present

## 2024-04-27 DIAGNOSIS — M810 Age-related osteoporosis without current pathological fracture: Secondary | ICD-10-CM | POA: Diagnosis not present

## 2024-04-27 DIAGNOSIS — N3946 Mixed incontinence: Secondary | ICD-10-CM | POA: Diagnosis not present

## 2024-04-30 DIAGNOSIS — R946 Abnormal results of thyroid function studies: Secondary | ICD-10-CM | POA: Diagnosis not present

## 2024-04-30 DIAGNOSIS — M8580 Other specified disorders of bone density and structure, unspecified site: Secondary | ICD-10-CM | POA: Diagnosis not present

## 2024-04-30 DIAGNOSIS — M810 Age-related osteoporosis without current pathological fracture: Secondary | ICD-10-CM | POA: Diagnosis not present

## 2024-04-30 DIAGNOSIS — M8588 Other specified disorders of bone density and structure, other site: Secondary | ICD-10-CM | POA: Diagnosis not present

## 2024-04-30 DIAGNOSIS — E559 Vitamin D deficiency, unspecified: Secondary | ICD-10-CM | POA: Diagnosis not present

## 2024-04-30 DIAGNOSIS — R5383 Other fatigue: Secondary | ICD-10-CM | POA: Diagnosis not present

## 2024-05-01 ENCOUNTER — Ambulatory Visit
Admission: RE | Admit: 2024-05-01 | Discharge: 2024-05-01 | Disposition: A | Discharge status: Home / Self Care | Source: Ambulatory Visit | Attending: Interventional Radiology | Admitting: Interventional Radiology

## 2024-05-01 DIAGNOSIS — M545 Low back pain, unspecified: Secondary | ICD-10-CM

## 2024-05-06 ENCOUNTER — Ambulatory Visit
Admission: RE | Admit: 2024-05-06 | Discharge: 2024-05-06 | Disposition: A | Discharge status: Home / Self Care | Source: Ambulatory Visit | Attending: Interventional Radiology | Admitting: Interventional Radiology

## 2024-05-06 DIAGNOSIS — M545 Low back pain, unspecified: Secondary | ICD-10-CM | POA: Insufficient documentation

## 2024-05-06 DIAGNOSIS — R188 Other ascites: Secondary | ICD-10-CM | POA: Diagnosis not present

## 2024-05-06 DIAGNOSIS — D259 Leiomyoma of uterus, unspecified: Secondary | ICD-10-CM | POA: Diagnosis not present

## 2024-05-14 DIAGNOSIS — M810 Age-related osteoporosis without current pathological fracture: Secondary | ICD-10-CM | POA: Diagnosis not present

## 2024-05-14 DIAGNOSIS — M85832 Other specified disorders of bone density and structure, left forearm: Secondary | ICD-10-CM | POA: Diagnosis not present

## 2024-05-19 ENCOUNTER — Ambulatory Visit
Admission: RE | Admit: 2024-05-19 | Discharge: 2024-05-19 | Disposition: A | Discharge status: Home / Self Care | Source: Ambulatory Visit | Attending: Interventional Radiology | Admitting: Interventional Radiology

## 2024-05-19 ENCOUNTER — Other Ambulatory Visit: Payer: Self-pay | Admitting: Interventional Radiology

## 2024-05-19 DIAGNOSIS — G8929 Other chronic pain: Secondary | ICD-10-CM

## 2024-05-19 HISTORY — PX: IR RADIOLOGIST EVAL & MGMT: IMG5224

## 2024-05-20 ENCOUNTER — Other Ambulatory Visit: Payer: Self-pay | Admitting: Interventional Radiology

## 2024-05-20 DIAGNOSIS — S32020A Wedge compression fracture of second lumbar vertebra, initial encounter for closed fracture: Secondary | ICD-10-CM

## 2024-05-31 ENCOUNTER — Other Ambulatory Visit: Payer: Self-pay | Admitting: Interventional Radiology

## 2024-05-31 ENCOUNTER — Ambulatory Visit
Admission: RE | Admit: 2024-05-31 | Discharge: 2024-05-31 | Disposition: A | Discharge status: Home / Self Care | Source: Ambulatory Visit | Attending: Interventional Radiology | Admitting: Interventional Radiology

## 2024-05-31 ENCOUNTER — Encounter: Payer: Self-pay | Admitting: Radiology

## 2024-05-31 DIAGNOSIS — S32020A Wedge compression fracture of second lumbar vertebra, initial encounter for closed fracture: Secondary | ICD-10-CM

## 2024-05-31 DIAGNOSIS — M8008XA Age-related osteoporosis with current pathological fracture, vertebra(e), initial encounter for fracture: Secondary | ICD-10-CM

## 2024-05-31 HISTORY — PX: IR RADIOLOGIST EVAL & MGMT: IMG5224

## 2024-05-31 NOTE — Progress Notes (Signed)
 Chief Complaint: Patient was seen in consultation today for L2 compression fractue at the request of Amneet Cendejas K  Referring Physician(s): Sharice Harriss K  History of Present Illness: Emma Lewis is a 73 y.o. female who is well known to me.  She has known osteoporosis and a history of multiple prior insufficiency fractures which responded well to prior cement augmentation.    Most recently she underwent an MRI and experienced severe pain after being helped up into the sitting position after the exam.  The MRI itself dated 05/06/24 showed a subacute fracture of the inferior endplate of L2 as well as prior cement augmentation at L1, L3 and L4.    Today she describes persistent severe pain (7-8/10) despite more than 6 weeks of conservative therapy.  Her symptoms are also debilitating, she scored a 19/24 on the [employer] disability questionnaire.  She is eager to undergo treatment with cement augmentation.   Past Medical History:  Diagnosis Date   Allergies    Arthritis    all over my body (04/29/2018)   Chronic lower back pain    Fatty liver    Fever blister    Takes Acyclovir    GERD (gastroesophageal reflux disease)    Hyperlipemia    Hypothyroidism    Kidney stones    Migraines    Osteopenia    Pneumonia    once; years ago (04/29/2018)   PONV (postoperative nausea and vomiting)    dry heaves   Pre-diabetes    Seasonal allergies    Vertigo     Past Surgical History:  Procedure Laterality Date   ANTERIOR CERVICAL DECOMP/DISCECTOMY FUSION N/A 02/09/2014   Procedure: ANTERIOR CERVICAL DECOMPRESSION/DISCECTOMY FUSION 2 LEVELS;  Surgeon: Oneil JAYSON Herald, MD;  Location: [facility] OR;  Service: Orthopedics;  Laterality: N/A;  C4-5, C5-6 Anterior Cervical Discectomy and Fusion, Allograft, Plate   BACK SURGERY     CARPAL TUNNEL RELEASE Right 2018   CATARACT EXTRACTION W/ INTRAOCULAR LENS  IMPLANT, BILATERAL     COLONOSCOPY     FIXATION KYPHOPLASTY LUMBAR SPINE  X 2    HIP ARTHROPLASTY Left 2011   INCISION AND DRAINAGE HIP Left 06/19/2018   Procedure: IRRIGATION AND DEBRIDEMENT LEFT HIP HEMATOMA ANTIBIOTIC BEADS, APPLICATION ON WOUND VAC;  Surgeon: Herald Oneil JAYSON, MD;  Location: [facility] OR;  Service: Orthopedics;  Laterality: Left;   IR KYPHO LUMBAR INC FX REDUCE BONE BX UNI/BIL CANNULATION INC/IMAGING  03/18/2024   IR RADIOLOGIST EVAL & MGMT  03/09/2024   IR RADIOLOGIST EVAL & MGMT  05/19/2024   IR RADIOLOGIST EVAL & MGMT  05/31/2024   JOINT REPLACEMENT     KNEE ARTHROSCOPY Right    ORIF PERIPROSTHETIC FRACTURE Left 06/01/2018   Procedure: OPEN REDUCTION INTERNAL FIXATION (ORIF) PERIPROSTHETIC FEMUR FRACTURE;  Surgeon: Herald Oneil JAYSON, MD;  Location: [facility] OR;  Service: Orthopedics;  Laterality: Left;   PATELLA FRACTURE SURGERY Right ~ 1990   put metal in   PATELLA HARDWARE REMOVAL Right    took the metal out   REVISION TOTAL HIP ARTHROPLASTY Left 04/29/2018   ROBOTIC ASSISTED BILATERAL SALPINGO OOPHERECTOMY Bilateral 08/01/2015   Procedure: ROBOTIC ASSISTED BILATERAL SALPINGO OOPHORECTOMY;  Surgeon: Maurilio Ship, MD;  Location: [facility];  Service: Gynecology;  Laterality: Bilateral;   SHOULDER OPEN ROTATOR CUFF REPAIR Right    3 TOTAL   SHOULDER SURGERY Right    TOTAL HIP ARTHROPLASTY Right 01/21/2018   Procedure: RIGHT TOTAL HIP ARTHROPLASTY DIRECT ANTERIOR;  Surgeon: Herald Oneil JAYSON, MD;  Location: [facility] OR;  Service: Orthopedics;  Laterality: Right;   TOTAL HIP REVISION Left 06/10/2016   Procedure: TOTAL HIP REVISION ARTHROPLASTY;  Surgeon: Redell Shoals, MD;  Location: [facility] OR;  Service: Orthopedics;  Laterality: Left;   TOTAL HIP REVISION Left 04/29/2018   Procedure: left total hip arthroplasty revision posterior approach;  Surgeon: Barbarann Oneil BROCKS, MD;  Location: [facility] OR;  Service: Orthopedics;  Laterality: Left;   TOTAL HIP REVISION Left 09/21/2018   Procedure: left total hip arthroplasty femoral revision;  Surgeon: Barbarann Oneil BROCKS, MD;  Location: [facility] OR;  Service:  Orthopedics;  Laterality: Left;   WRIST GANGLION EXCISION Left 1970s    Allergies: Entocort ec  [budesonide ], Sulfa antibiotics, and Tape  Medications: Prior to Admission medications   Medication Sig Start Date End Date Taking? Authorizing Provider  acyclovir  (ZOVIRAX ) 400 MG tablet Take 1 tablet (400 mg total) by mouth 2 (two) times daily. 05/15/18   Angiulli, Toribio PARAS, PA-C  AMBULATORY NON FORMULARY MEDICATION Nutrafol Take 4 capsule by mouth daily    [provider]  AMBULATORY NON FORMULARY MEDICATION Beet Root Take 2 capsule by mouth daily Patient not taking: Reported on 03/09/2024    [provider]  AMBULATORY NON FORMULARY MEDICATION Aspercreme Patch 1 patch topical daily Patient not taking: Reported on 03/09/2024    [provider]  atorvastatin (LIPITOR) 40 MG tablet Take 40 mg by mouth daily.    [provider]  B-D UF III MINI PEN NEEDLES 31G X 5 MM MISC Inject into the skin as directed. Patient not taking: Reported on 10/17/2022 03/26/22   [provider]  Calcium  Carb-Cholecalciferol  (CALCIUM  600 + D PO) Take 1 tablet by mouth 3 (three) times daily.     [provider]  Cholecalciferol  (VITAMIN D3) 2000 units TABS Take 2,000 Units by mouth 2 (two) times daily.     [provider]  Coenzyme Q10 (CO Q10) 100 MG CAPS Take 1 capsule by mouth daily.    [provider]  Cyanocobalamin  (VITAMIN B-12) 2500 MCG SUBL Place 1 tablet under the tongue daily.    [provider]  diphenoxylate -atropine  (LOMOTIL ) 2.5-0.025 MG tablet TAKE 1-3 TABLETS BY MOUTH 4 TIMES DAILY AS NEEDED FOR  DIARRHEA  OR  LOOSE  STOOLS Patient not taking: Reported on 03/09/2024 06/03/22   Mansouraty, Aloha Raddle., MD  ibuprofen  (ADVIL ) 200 MG tablet Take 800 mg by mouth 2 (two) times daily.    [provider]  levothyroxine  (EUTHYROX ) 100 MCG tablet Take 75 mcg by mouth daily before breakfast.    [provider]   Menaquinone-7 (VITAMIN K2) 100 MCG CAPS Take 1 capsule by mouth daily.    [provider]  nadolol  (CORGARD ) 20 MG tablet Take 1/2 tab (10 mg) for 1 week then increase to 1 tab (20 mg) daily Patient not taking: Reported on 03/09/2024 05/22/22   Mansouraty, Aloha Raddle., MD  omeprazole  (PRILOSEC  OTC) 20 MG tablet Take 10 mg by mouth daily.    [provider]  Peppermint Oil (IBGARD) 90 MG CPCR Take by mouth. Take 2 capsules by mouth in the mornings and 1-2 tablets at night Patient not taking: Reported on 03/09/2024    [provider]  Turmeric Curcumin 500 MG CAPS Take 1 capsule by mouth 2 (two) times daily.    [provider]     Family History  Problem Relation Age of Onset   Lymphoma Mother    Arthritis Mother    Hyperlipidemia Mother  Colon polyps Father    Diabetes Father    Cirrhosis Father    Hyperlipidemia Father    Colon cancer Neg Hx    Stomach cancer Neg Hx    Esophageal cancer Neg Hx    Inflammatory bowel disease Neg Hx    Liver disease Neg Hx    Pancreatic cancer Neg Hx    Rectal cancer Neg Hx     Social History   Socioeconomic History   Marital status: Married    Spouse name: Not on file   Number of children: 1   Years of education: Not on file   Highest education level: Not on file  Occupational History   Not on file  Tobacco Use   Smoking status: Never   Smokeless tobacco: Never  Vaping Use   Vaping status: Never Used  Substance and Sexual Activity   Alcohol  use: Not Currently    Comment: `   Drug use: Never   Sexual activity: Not Currently  Other Topics Concern   Not on file  Social History Narrative   Not on file   Social Drivers of Health   Financial Resource Strain: Low Risk  (11/10/2023)   Received from [facility]   Overall Financial Resource Strain (CARDIA)    Difficulty of Paying Living Expenses: Not hard at all  Food Insecurity: Patient Declined (11/07/2023)   Received from [facility]   Hunger Vital Sign    Within the past 12 months, you worried that your food would run out before you got the money to buy more.: Patient declined    Within the past 12 months, the food you bought just didn't last and you didn't have money to get more.: Patient declined  Transportation Needs: No Transportation Needs (11/07/2023)   Received from [facility] - Transportation    In the past 12 months, has lack of transportation kept you from medical appointments or from getting medications?: No    Lack of Transportation (Non-Medical): No  Physical Activity: Not on file  Stress: Not on file  Social Connections: Not on file   Review of Systems: A 12 point ROS discussed and pertinent positives are indicated in the HPI above.  All other systems are negative.  Review of Systems  Vital Signs: BP (!) 151/69 (BP Location: Left Arm, Patient Position: Sitting, Cuff Size: Normal)   Pulse 79   Temp 97.8 F (36.6 C) (Oral)   SpO2 98%   Physical Exam Constitutional:      General: She is not in acute distress.    Appearance: Normal appearance.  HENT:     Head: Normocephalic and atraumatic.  Eyes:     General: No scleral icterus. Cardiovascular:     Rate and Rhythm: Normal rate.  Pulmonary:     Effort: Pulmonary effort is normal.  Abdominal:     General: There is no distension.     Tenderness: There is no abdominal tenderness. There is no guarding.  Musculoskeletal:        General: Tenderness present.       Back:     Comments: TTP at L2 spinous process  Skin:    General: Skin is warm and dry.  Neurological:     Mental Status: She is alert and oriented to person, place, and time.  Psychiatric:        Mood and Affect: Mood normal.        Behavior: Behavior normal.  Imaging: IR Radiologist Eval & Mgmt Result Date: 05/31/2024 EXAM: NEW PATIENT OFFICE VISIT CHIEF COMPLAINT: SEE NOTE IN EPIC HISTORY OF PRESENT ILLNESS: SEE NOTE  IN EPIC REVIEW OF SYSTEMS: SEE NOTE IN EPIC PHYSICAL EXAMINATION: SEE NOTE IN EPIC ASSESSMENT AND PLAN: SEE NOTE IN EPIC Electronically Signed   By: Wilkie Lent M.D.   On: 05/31/2024 13:10   IR Radiologist Eval & Mgmt Result Date: 05/19/2024 EXAM: ESTABLISHED PATIENT OFFICE VISIT CHIEF COMPLAINT: SEE NOTE IN EPIC HISTORY OF PRESENT ILLNESS: SEE NOTE IN EPIC REVIEW OF SYSTEMS: SEE NOTE IN EPIC PHYSICAL EXAMINATION: SEE NOTE IN EPIC ASSESSMENT AND PLAN: SEE NOTE IN EPIC Electronically Signed   By: Wilkie Lent M.D.   On: 05/19/2024 16:05   MR PELVIS WO CONTRAST Result Date: 05/11/2024 CLINICAL DATA:  Chronic low back pain EXAM: MRI PELVIS WITHOUT CONTRAST TECHNIQUE: Multiplanar multisequence MR imaging of the pelvis was performed. No intravenous contrast was administered. COMPARISON:  CT pelvis 01/23/2024 FINDINGS: Urinary Tract: Wall thickening in the urinary bladder may be from nondistention or mild cystitis. Bowel:  Unremarkable where included. Vascular/Lymphatic: No appreciable aneurysm in the pelvis. No overt pathologic adenopathy. Reproductive: 1.2 cm in diameter posterior uterine body fibroid, image 35 series 10. Other:  Moderate pelvic ascites. Osseous structures: Likely subacute inferior endplate compression fracture at L2 with mild marrow edema. Remote compression fractures at L3, L4, and L5 with vertebral augmentations at these levels. Metal artifact from bilateral total hip prostheses. Right sacral stimulator extends along the right S3 foramen and terminates just anterior to the sacrum as on 01/23/2024. Musculotendinous: Substantial regional muscular atrophy is symmetric and favors sarcopenia. Trace nonspecific edema signal in the left gluteus medius and along the deep margins of the iliacus muscles. Likewise there is trace edema signal along the left hip adductor musculature. Strictly speaking muscle strain is not excluded although some of the appearance may reflect denervation atrophy.  Heterotopic ossification noted in the left gluteus medius muscle above the left hip. This is not changed from previous. Sacral plexus: Normal appearance aside from the presence of the right S3 stimulator. No impingement on the proximal sciatic nerves observed. IMPRESSION: 1. Likely subacute inferior endplate compression fracture at L2 with mild marrow edema. 2. Remote compression fractures at L3, L4, and L5 with vertebral augmentations at these levels. 3. Moderate pelvic ascites. 4. Wall thickening in the urinary bladder may be from nondistention or mild cystitis. 5. Substantial regional muscular atrophy is symmetric and favors sarcopenia. Trace edema signal in the left gluteus medius and along the deep margins of the iliacus muscles. Likewise there is trace edema signal along the left hip adductor musculature. Strictly speaking muscle strain is not excluded although some of the appearance may reflect denervation atrophy. 6. Right sacral stimulator extends along the right S3 foramen and terminates just anterior to the sacrum as on 01/23/2024. Electronically Signed   By: Ryan Salvage M.D.   On: 05/11/2024 08:45    Labs:  CBC: Recent Labs    08/11/23 1142 03/09/24 0000  WBC 7.1 7.7  HGB 12.7 11.8  HCT 37.6 35.9  PLT 207 162    COAGS: Recent Labs    07/18/23 1313 03/09/24 0000  INR 1.3* 1.2*  APTT  --  31    BMP: Recent Labs    03/09/24 0000  NA 140  K 3.6  CL 110  CO2 24  GLUCOSE 92  BUN 16  CALCIUM  9.1  CREATININE 0.90    LIVER FUNCTION TESTS: Recent  Labs    07/18/23 1313 03/09/24 0000  BILITOT 1.0 0.9  AST 53* 54*  ALT 29 31*  ALKPHOS 215*  --   PROT 6.7 5.9*  ALBUMIN  3.3*  --     TUMOR MARKERS: Recent Labs    07/18/23 1313  AFPTM 5.1    Assessment & Plan:   Patient has suffered subacute osteoporotic fracture of the L2 vertebra.   History and exam have demonstrated the following:  Acute/Subacute fracture by imaging dated 05/06/24, Pain on exam  concordant with level of fracture, Failure of conservative therapy and pain refractory to narcotic pain mediation, and Significant disability on the [employer] Disability Questionnaire with 19/24 positive symptoms, reflecting significant impact/impairment of (ADLs)   ICD-10-CM Codes that Support Medical Necessity ([email or web address]?[email or web address])  M80.08XA    Age-related osteoporosis with current pathological fracture, vertebra(e), initial encounter for fracture   Plan:  L2 vertebral body augmentation with balloon kyphoplasty  Post-procedure disposition: outpatient DRI-A  Medication holds: None  The patient has suffered a fracture of the L2 vertebral body. It is recommended that patients aged 49 years or older be evaluated for possible testing or treatment of osteoporosis. A copy of this consult report is sent to the patient's referring physician.  Advanced Care Plan: The patient did not want to provide an Advanced Care Plan at the time of this visit     Total time spent on today's visit was over  25 Minutes, including both face-to-face time and non face-to-face time, personally spent on review of chart (including labs and relevant imaging), discussing further workup and treatment options, referral to specialist if needed, reviewing outside records if pertinent, answering patient questions, and coordinating care regarding L2 osteoporotic compression fracture as well as management strategy.    Electronically Signed: Wilkie POUR Jahan Friedlander 05/31/2024, 1:33 PM

## 2024-06-01 ENCOUNTER — Ambulatory Visit
Admission: RE | Admit: 2024-06-01 | Discharge: 2024-06-01 | Disposition: A | Discharge status: Home / Self Care | Source: Ambulatory Visit | Attending: Interventional Radiology | Admitting: Interventional Radiology

## 2024-06-01 ENCOUNTER — Other Ambulatory Visit

## 2024-06-01 DIAGNOSIS — M8008XA Age-related osteoporosis with current pathological fracture, vertebra(e), initial encounter for fracture: Secondary | ICD-10-CM

## 2024-06-01 HISTORY — PX: IR KYPHO LUMBAR INC FX REDUCE BONE BX UNI/BIL CANNULATION INC/IMAGING: IMG5519

## 2024-06-01 MED ORDER — CEFAZOLIN SODIUM-DEXTROSE 2-4 GM/100ML-% IV SOLN
2.0000 g | INTRAVENOUS | Status: AC
  Administered 2024-06-01: 2 g via INTRAVENOUS

## 2024-06-01 MED ORDER — ACETAMINOPHEN 10 MG/ML IV SOLN
1000.0000 mg | Freq: Once | INTRAVENOUS | Status: AC
  Administered 2024-06-01: 1000 mg via INTRAVENOUS

## 2024-06-01 MED ORDER — MIDAZOLAM HCL (PF) 2 MG/2ML IJ SOLN: 1.0000 mg | INTRAMUSCULAR | Status: DC | PRN

## 2024-06-01 MED ORDER — LIDOCAINE-EPINEPHRINE 1 %-1:100000 IJ SOLN
10.0000 mL | Freq: Once | INTRAMUSCULAR | Status: AC
  Administered 2024-06-01: 10 mL via INTRADERMAL

## 2024-06-01 MED ORDER — FENTANYL CITRATE (PF) 100 MCG/2ML IJ SOLN
INTRAMUSCULAR | Status: AC | PRN
  Administered 2024-06-01 (×2): 25 ug via INTRAVENOUS
  Administered 2024-06-01 (×3): 50 ug via INTRAVENOUS

## 2024-06-01 MED ORDER — SODIUM CHLORIDE 0.9 % IV SOLN: INTRAVENOUS | Status: DC

## 2024-06-01 MED ORDER — MIDAZOLAM HCL (PF) 2 MG/2ML IJ SOLN
INTRAMUSCULAR | Status: AC | PRN
  Administered 2024-06-01 (×2): 1 mg via INTRAVENOUS

## 2024-06-01 MED ORDER — ONDANSETRON HCL 4 MG/2ML IJ SOLN
8.0000 mg | Freq: Once | INTRAMUSCULAR | Status: AC
  Administered 2024-06-01: 8 mg via INTRAVENOUS

## 2024-06-01 MED ORDER — FENTANYL CITRATE (PF) 50 MCG/ML IJ SOSY: 25.0000 ug | PREFILLED_SYRINGE | INTRAMUSCULAR | Status: DC | PRN

## 2024-06-01 NOTE — Discharge Instructions (Signed)

## 2024-06-09 ENCOUNTER — Telehealth: Payer: Self-pay

## 2024-06-09 ENCOUNTER — Other Ambulatory Visit: Payer: Self-pay | Admitting: Interventional Radiology

## 2024-06-09 DIAGNOSIS — S32020G Wedge compression fracture of second lumbar vertebra, subsequent encounter for fracture with delayed healing: Secondary | ICD-10-CM

## 2024-06-17 DIAGNOSIS — D649 Anemia, unspecified: Secondary | ICD-10-CM | POA: Diagnosis not present

## 2024-06-17 DIAGNOSIS — M810 Age-related osteoporosis without current pathological fracture: Secondary | ICD-10-CM | POA: Diagnosis not present

## 2024-06-17 DIAGNOSIS — M8000XG Age-related osteoporosis with current pathological fracture, unspecified site, subsequent encounter for fracture with delayed healing: Secondary | ICD-10-CM | POA: Diagnosis not present

## 2024-06-18 ENCOUNTER — Other Ambulatory Visit (HOSPITAL_COMMUNITY): Payer: Self-pay | Admitting: Sports Medicine

## 2024-06-18 ENCOUNTER — Telehealth (HOSPITAL_COMMUNITY): Payer: Self-pay

## 2024-06-18 DIAGNOSIS — M8000XG Age-related osteoporosis with current pathological fracture, unspecified site, subsequent encounter for fracture with delayed healing: Secondary | ICD-10-CM | POA: Insufficient documentation

## 2024-06-18 NOTE — Telephone Encounter (Signed)
 Auth Submission: NO AUTH NEEDED Site of care: Site of care: [facility] Payer: Medicare A/B, BCBS Supplement Medication & CPT/J Code(s) submitted: Prolia  (Denosumab ) [id number] Diagnosis Code: M81.0 Route of submission (phone, fax, portal):  Phone # Fax # Auth type: Buy/Bill HB Units/visits requested: 60mg  q53months Reference number:  Approval from: 06/18/24 to 08/28/24

## 2024-06-22 ENCOUNTER — Ambulatory Visit
Admission: RE | Admit: 2024-06-22 | Discharge: 2024-06-22 | Disposition: A | Discharge status: Home / Self Care | Source: Ambulatory Visit | Attending: Interventional Radiology | Admitting: Interventional Radiology

## 2024-06-22 DIAGNOSIS — M545 Low back pain, unspecified: Secondary | ICD-10-CM | POA: Diagnosis not present

## 2024-06-22 DIAGNOSIS — S32020G Wedge compression fracture of second lumbar vertebra, subsequent encounter for fracture with delayed healing: Secondary | ICD-10-CM

## 2024-06-22 DIAGNOSIS — Z9889 Other specified postprocedural states: Secondary | ICD-10-CM | POA: Diagnosis not present

## 2024-06-22 HISTORY — PX: IR RADIOLOGIST EVAL & MGMT: IMG5224

## 2024-06-22 NOTE — Progress Notes (Signed)
 Chief Complaint: Patient was seen in consultation today for low back pain at the request of Carrick Rijos K  Referring Physician(s): Mia Milan K  History of Present Illness: Emma Lewis is a 73 y.o. female who is well known to me.  She has known osteoporosis and a history of multiple prior insufficiency fractures which responded well to prior cement augmentation.     Most recently she underwent an MRI and experienced severe pain after being helped up into the sitting position after the exam.  The MRI itself dated 05/06/24 showed a subacute fracture of the inferior endplate of L2 as well as prior cement augmentation at L1, L3 and L4.     She underwent L2 cement augmentation on 06/01/24 and presents today for her follow up.  She reports her symptoms are slightly improved but she continues to have significant lower back pain below the prior fracture site and remains disabled and unable to stand for any length of time without having to sit and rest.  Her pain is down low at the waistline and greater on the right.    Past Medical History:  Diagnosis Date   Allergies    Arthritis    all over my body (04/29/2018)   Chronic lower back pain    Fatty liver    Fever blister    Takes Acyclovir    GERD (gastroesophageal reflux disease)    Hyperlipemia    Hypothyroidism    Kidney stones    Migraines    Osteopenia    Pneumonia    once; years ago (04/29/2018)   PONV (postoperative nausea and vomiting)    dry heaves   Pre-diabetes    Seasonal allergies    Vertigo     Past Surgical History:  Procedure Laterality Date   ANTERIOR CERVICAL DECOMP/DISCECTOMY FUSION N/A 02/09/2014   Procedure: ANTERIOR CERVICAL DECOMPRESSION/DISCECTOMY FUSION 2 LEVELS;  Surgeon: Oneil JAYSON Herald, MD;  Location: [facility] OR;  Service: Orthopedics;  Laterality: N/A;  C4-5, C5-6 Anterior Cervical Discectomy and Fusion, Allograft, Plate   BACK SURGERY     CARPAL TUNNEL RELEASE Right 2018   CATARACT  EXTRACTION W/ INTRAOCULAR LENS  IMPLANT, BILATERAL     COLONOSCOPY     FIXATION KYPHOPLASTY LUMBAR SPINE  X 2   HIP ARTHROPLASTY Left 2011   INCISION AND DRAINAGE HIP Left 06/19/2018   Procedure: IRRIGATION AND DEBRIDEMENT LEFT HIP HEMATOMA ANTIBIOTIC BEADS, APPLICATION ON WOUND VAC;  Surgeon: Herald Oneil JAYSON, MD;  Location: [facility] OR;  Service: Orthopedics;  Laterality: Left;   IR KYPHO LUMBAR INC FX REDUCE BONE BX UNI/BIL CANNULATION INC/IMAGING  03/18/2024   IR KYPHO LUMBAR INC FX REDUCE BONE BX UNI/BIL CANNULATION INC/IMAGING  06/01/2024   IR RADIOLOGIST EVAL & MGMT  03/09/2024   IR RADIOLOGIST EVAL & MGMT  05/19/2024   IR RADIOLOGIST EVAL & MGMT  05/31/2024   IR RADIOLOGIST EVAL & MGMT  06/22/2024   JOINT REPLACEMENT     KNEE ARTHROSCOPY Right    ORIF PERIPROSTHETIC FRACTURE Left 06/01/2018   Procedure: OPEN REDUCTION INTERNAL FIXATION (ORIF) PERIPROSTHETIC FEMUR FRACTURE;  Surgeon: Herald Oneil JAYSON, MD;  Location: [facility] OR;  Service: Orthopedics;  Laterality: Left;   PATELLA FRACTURE SURGERY Right ~ 1990   put metal in   PATELLA HARDWARE REMOVAL Right    took the metal out   REVISION TOTAL HIP ARTHROPLASTY Left 04/29/2018   ROBOTIC ASSISTED BILATERAL SALPINGO OOPHERECTOMY Bilateral 08/01/2015   Procedure: ROBOTIC ASSISTED BILATERAL SALPINGO OOPHORECTOMY;  Surgeon: Maurilio  Eloy, MD;  Location: [facility];  Service: Gynecology;  Laterality: Bilateral;   SHOULDER OPEN ROTATOR CUFF REPAIR Right    3 TOTAL   SHOULDER SURGERY Right    TOTAL HIP ARTHROPLASTY Right 01/21/2018   Procedure: RIGHT TOTAL HIP ARTHROPLASTY DIRECT ANTERIOR;  Surgeon: Barbarann Oneil BROCKS, MD;  Location: [facility] OR;  Service: Orthopedics;  Laterality: Right;   TOTAL HIP REVISION Left 06/10/2016   Procedure: TOTAL HIP REVISION ARTHROPLASTY;  Surgeon: Redell Shoals, MD;  Location: [facility] OR;  Service: Orthopedics;  Laterality: Left;   TOTAL HIP REVISION Left 04/29/2018   Procedure: left total hip arthroplasty revision posterior approach;  Surgeon:  Barbarann Oneil BROCKS, MD;  Location: [facility] OR;  Service: Orthopedics;  Laterality: Left;   TOTAL HIP REVISION Left 09/21/2018   Procedure: left total hip arthroplasty femoral revision;  Surgeon: Barbarann Oneil BROCKS, MD;  Location: [facility] OR;  Service: Orthopedics;  Laterality: Left;   WRIST GANGLION EXCISION Left 1970s    Allergies: Entocort ec  [budesonide ], Sulfa antibiotics, and Tape  Medications: Prior to Admission medications   Medication Sig Start Date End Date Taking? Authorizing Provider  acyclovir  (ZOVIRAX ) 400 MG tablet Take 1 tablet (400 mg total) by mouth 2 (two) times daily. 05/15/18   Angiulli, Toribio PARAS, PA-C  AMBULATORY NON FORMULARY MEDICATION Nutrafol Take 4 capsule by mouth daily    [provider]  AMBULATORY NON FORMULARY MEDICATION Beet Root Take 2 capsule by mouth daily Patient not taking: Reported on 03/09/2024    [provider]  AMBULATORY NON FORMULARY MEDICATION Aspercreme Patch 1 patch topical daily Patient not taking: Reported on 03/09/2024    [provider]  atorvastatin (LIPITOR) 40 MG tablet Take 40 mg by mouth daily.    [provider]  B-D UF III MINI PEN NEEDLES 31G X 5 MM MISC Inject into the skin as directed. Patient not taking: Reported on 10/17/2022 03/26/22   [provider]  Calcium  Carb-Cholecalciferol  (CALCIUM  600 + D PO) Take 1 tablet by mouth 3 (three) times daily.     [provider]  Cholecalciferol  (VITAMIN D3) 2000 units TABS Take 2,000 Units by mouth 2 (two) times daily.     [provider]  Coenzyme Q10 (CO Q10) 100 MG CAPS Take 1 capsule by mouth daily.    [provider]  Cyanocobalamin  (VITAMIN B-12) 2500 MCG SUBL Place 1 tablet under the tongue daily.    [provider]  diphenoxylate -atropine  (LOMOTIL ) 2.5-0.025 MG tablet TAKE 1-3 TABLETS BY MOUTH 4 TIMES DAILY AS NEEDED FOR  DIARRHEA  OR  LOOSE  STOOLS Patient not taking: Reported on 03/09/2024 06/03/22   Mansouraty, Aloha Raddle., MD  ibuprofen  (ADVIL ) 200 MG tablet Take 800 mg by mouth 2 (two) times daily.    [provider]  levothyroxine  (EUTHYROX ) 100 MCG tablet Take 75 mcg by mouth daily before breakfast.    [provider]  Menaquinone-7 (VITAMIN K2) 100 MCG CAPS Take 1 capsule by mouth daily.    [provider]  nadolol  (CORGARD ) 20 MG tablet Take 1/2 tab (10 mg) for 1 week then increase to 1 tab (20 mg) daily Patient not taking: Reported on 03/09/2024 05/22/22   Mansouraty, Aloha Raddle., MD  omeprazole  (PRILOSEC  OTC) 20 MG tablet Take 10 mg by mouth daily.    [provider]  Peppermint Oil (IBGARD) 90 MG CPCR Take by mouth. Take 2 capsules by mouth in the mornings and 1-2 tablets at night Patient not taking: Reported on  03/09/2024    [provider]  Turmeric Curcumin 500 MG CAPS Take 1 capsule by mouth 2 (two) times daily.    [provider]     Family History  Problem Relation Age of Onset   Lymphoma Mother    Arthritis Mother    Hyperlipidemia Mother    Colon polyps Father    Diabetes Father    Cirrhosis Father    Hyperlipidemia Father    Colon cancer Neg Hx    Stomach cancer Neg Hx    Esophageal cancer Neg Hx    Inflammatory bowel disease Neg Hx    Liver disease Neg Hx    Pancreatic cancer Neg Hx    Rectal cancer Neg Hx     Social History   Socioeconomic History   Marital status: Married    Spouse name: Not on file   Number of children: 1   Years of education: Not on file   Highest education level: Not on file  Occupational History   Not on file  Tobacco Use   Smoking status: Never   Smokeless tobacco: Never  Vaping Use   Vaping status: Never Used  Substance and Sexual Activity   Alcohol  use: Not Currently    Comment: `   Drug use: Never   Sexual activity: Not Currently  Other Topics Concern   Not on file  Social History Narrative   Not on file   Social Drivers of Health   Financial Resource Strain: Low Risk   (11/10/2023)   Received from [facility]   Overall Financial Resource Strain (CARDIA)    Difficulty of Paying Living Expenses: Not hard at all  Food Insecurity: Patient Declined (11/07/2023)   Received from [facility]   Hunger Vital Sign    Within the past 12 months, you worried that your food would run out before you got the money to buy more.: Patient declined    Within the past 12 months, the food you bought just didn't last and you didn't have money to get more.: Patient declined  Transportation Needs: No Transportation Needs (11/07/2023)   Received from [facility] - Transportation    In the past 12 months, has lack of transportation kept you from medical appointments or from getting medications?: No    Lack of Transportation (Non-Medical): No  Physical Activity: Not on file  Stress: Not on file  Social Connections: Not on file    Review of Systems: A 12 point ROS discussed and pertinent positives are indicated in the HPI above.  All other systems are negative.  Review of Systems  Vital Signs: BP (!) 141/76 (BP Location: Left Arm, Patient Position: Sitting, Cuff Size: Normal)   Pulse 87   Temp 98.1 F (36.7 C) (Oral)   Resp 16   SpO2 99%   Physical Exam Constitutional:      General: She is not in acute distress.    Appearance: Normal appearance.  HENT:     Head: Normocephalic and atraumatic.  Eyes:     General: No scleral icterus. Cardiovascular:     Rate and Rhythm: Normal rate.  Pulmonary:     Effort: Pulmonary effort is normal.  Abdominal:     General: There is no distension.     Tenderness: There is no abdominal tenderness. There is no guarding.  Musculoskeletal:        General: Tenderness present.       Back:  Comments: TTP in the region of the RIGHT L4-L5 and L5-S1 facets  Skin:    General: Skin is dry.  Neurological:     General: No focal deficit present.     Mental Status: She is alert  and oriented to person, place, and time.  Psychiatric:        Mood and Affect: Mood normal.        Behavior: Behavior normal.       Imaging: IR Radiologist Eval & Mgmt Result Date: 06/22/2024 EXAM: ESTABLISHED PATIENT OFFICE VISIT CHIEF COMPLAINT: SEE NOTE IN EPIC HISTORY OF PRESENT ILLNESS: SEE NOTE IN EPIC REVIEW OF SYSTEMS: SEE NOTE IN EPIC PHYSICAL EXAMINATION: SEE NOTE IN EPIC ASSESSMENT AND PLAN: SEE NOTE IN EPIC Electronically Signed   By: Emma Lewis M.D.   On: 06/22/2024 15:59   IR KYPHO LUMBAR INC FX REDUCE BONE BX UNI/BIL CANNULATION INC/IMAGING Result Date: 06/01/2024 CLINICAL DATA:  73 year old female with osteoporosis and a history of multiple compression fractures. She currently has an acute symptomatic osteoporotic compression fracture of the L2 vertebral body which has failed conservative management. Given her prior excellent response to cement augmentation with balloon kyphoplasty, she presents for L2 cement augmentation. EXAM: FLUOROSCOPIC GUIDED KYPHOPLASTY OF THE L2 VERTEBRAL BODY COMPARISON:  None Available. MEDICATIONS: As antibiotic prophylaxis, 2 g Ancef  was ordered pre-procedure and administered intravenously by the Radiology nurse within 1 hour of incision. 1000 mg Tylenol  was administered intravenously after the procedure by the radiology nurse. ANESTHESIA/SEDATION: Moderate (conscious) sedation was employed during this procedure. A total of Versed  2 mg and Fentanyl  200 mcg was administered intravenously by the Radiology nurse. Moderate Sedation Time: 23 minutes. The patient's level of consciousness and vital signs were monitored continuously by radiology nursing throughout the procedure under my direct supervision. FLUOROSCOPY TIME:  Radiology exposure index: 52.1 mGy, air kerma COMPLICATIONS: None immediate. PROCEDURE: The procedure, risks (including but not limited to bleeding, infection, organ damage), benefits, and alternatives were explained to the patient.  Questions regarding the procedure were encouraged and answered. The patient understands and consents to the procedure. The patient was placed prone on the fluoroscopic table. The skin overlying the lumbar region was then prepped and draped in the usual sterile fashion. Maximal barrier sterile technique was utilized including caps, mask, sterile gowns, sterile gloves, sterile drape, hand hygiene and skin antiseptic. Intravenous Fentanyl  and Versed  were administered as conscious sedation during continuous cardiorespiratory monitoring by the radiology RN. The right pedicle at L2 was then infiltrated with 1% lidocaine  followed by the advancement of a Kyphon trocar needle through the left pedicle into the posterior one-third of the vertebral body. Subsequently, the osteo drill was advanced to the anterior third of the vertebral body. The osteo drill was retracted. Through the working cannula, a Kyphon inflatable bone tamp 15 x 3 was advanced and positioned with the distal marker approximately 5 mm from the anterior aspect of the cortex. Appropriate positioning was confirmed on the AP projection. At this time, the balloon was expanded using contrast via a Kyphon inflation syringe device via micro tubing. In similar fashion, the left L2 pedicle was infiltrated with 1% lidocaine  followed by the advancement of a second Kyphon trocar needle through the right pedicle into the posterior third of the vertebral body. Subsequently, the osteo drill was coaxially advanced to the anterior right third. The osteo drill was exchanged for a Kyphon inflatable bone tamp 15 x 3, advanced to the 5 mm of the anterior aspect of the cortex. The  balloon was then expanded using contrast as above. Inflations were continued until there was near apposition with the superior end plate. At this time, methylmethacrylate mixture was reconstituted in the Kyphon bone mixing device system. This was then loaded into the delivery mechanism, attached to  Kyphon bone fillers. The balloons were deflated and removed followed by the instillation of methylmethacrylate mixture with excellent filling in the AP and lateral projections. No extravasation was noted in the disk spaces or posteriorly into the spinal canal. No epidural venous contamination was seen. The working cannulae and the bone filler were then retrieved and removed. Hemostasis was achieved with manual compression. The patient tolerated the procedure well without immediate postprocedural complication. IMPRESSION: 1. Technically successful L2 vertebral body augmentation using balloon kyphoplasty. 2. Per CMS PQRS reporting requirements (PQRS Measure 24): Given the patient's age of greater than 50 and the fracture site (hip, distal radius, or spine), the patient should be tested for osteoporosis using DXA, and the appropriate treatment considered based on the DXA results. Electronically Signed   By: Emma Lewis M.D.   On: 06/01/2024 10:01   IR Radiologist Eval & Mgmt Result Date: 05/31/2024 EXAM: NEW PATIENT OFFICE VISIT CHIEF COMPLAINT: SEE NOTE IN EPIC HISTORY OF PRESENT ILLNESS: SEE NOTE IN EPIC REVIEW OF SYSTEMS: SEE NOTE IN EPIC PHYSICAL EXAMINATION: SEE NOTE IN EPIC ASSESSMENT AND PLAN: SEE NOTE IN EPIC Electronically Signed   By: Emma Lewis M.D.   On: 05/31/2024 13:10    Labs:  CBC: Recent Labs    08/11/23 1142 03/09/24 0000  WBC 7.1 7.7  HGB 12.7 11.8  HCT 37.6 35.9  PLT 207 162    COAGS: Recent Labs    07/18/23 1313 03/09/24 0000  INR 1.3* 1.2*  APTT  --  31    BMP: Recent Labs    03/09/24 0000  NA 140  K 3.6  CL 110  CO2 24  GLUCOSE 92  BUN 16  CALCIUM  9.1  CREATININE 0.90    LIVER FUNCTION TESTS: Recent Labs    07/18/23 1313 03/09/24 0000  BILITOT 1.0 0.9  AST 53* 54*  ALT 29 31*  ALKPHOS 215*  --   PROT 6.7 5.9*  ALBUMIN  3.3*  --     TUMOR MARKERS: Recent Labs    07/18/23 1313  AFPTM 5.1    Assessment and Plan:  Unfortunate  73 year-old female with persistent low back pain despite cement augmentation of multiple sequential compression fractures.  Today, on exam her pain seems to be in the region of the right lower lumbar facets, L4-L5 and L5-S1 specifically.     I believe we need to repeat her lumbar MRI to ensure she does not have any untreated fractures and assess for facet arthropathy as well.  Assuming no new fracture, we can consider facet injections.   1.) MRI lumbar spine WO 2.) Tentatively schedule for right L4-L5 and L5-S1 facet injections after her MRI is completed.    Electronically Signed: Wilkie Emma Lewis 06/22/2024, 4:22 PM   I spent a total of  25 Minutes in face to face in clinical consultation, greater than 50% of which was counseling/coordinating care for low back pain

## 2024-06-23 ENCOUNTER — Other Ambulatory Visit: Payer: Self-pay | Admitting: Interventional Radiology

## 2024-06-23 DIAGNOSIS — Z9889 Other specified postprocedural states: Secondary | ICD-10-CM

## 2024-06-23 DIAGNOSIS — G8928 Other chronic postprocedural pain: Secondary | ICD-10-CM

## 2024-07-02 ENCOUNTER — Ambulatory Visit

## 2024-07-05 ENCOUNTER — Ambulatory Visit
Admission: RE | Admit: 2024-07-05 | Discharge: 2024-07-05 | Attending: Interventional Radiology | Admitting: Interventional Radiology

## 2024-07-05 DIAGNOSIS — J019 Acute sinusitis, unspecified: Secondary | ICD-10-CM | POA: Diagnosis not present

## 2024-07-05 DIAGNOSIS — M4856XA Collapsed vertebra, not elsewhere classified, lumbar region, initial encounter for fracture: Secondary | ICD-10-CM | POA: Diagnosis not present

## 2024-07-05 DIAGNOSIS — Z9889 Other specified postprocedural states: Secondary | ICD-10-CM | POA: Diagnosis not present

## 2024-07-05 DIAGNOSIS — M51369 Other intervertebral disc degeneration, lumbar region without mention of lumbar back pain or lower extremity pain: Secondary | ICD-10-CM | POA: Diagnosis not present

## 2024-07-05 DIAGNOSIS — Z0000 Encounter for general adult medical examination without abnormal findings: Secondary | ICD-10-CM | POA: Diagnosis not present

## 2024-07-05 DIAGNOSIS — K219 Gastro-esophageal reflux disease without esophagitis: Secondary | ICD-10-CM | POA: Diagnosis not present

## 2024-07-05 DIAGNOSIS — I119 Hypertensive heart disease without heart failure: Secondary | ICD-10-CM | POA: Diagnosis not present

## 2024-07-05 DIAGNOSIS — M48061 Spinal stenosis, lumbar region without neurogenic claudication: Secondary | ICD-10-CM | POA: Diagnosis not present

## 2024-07-05 DIAGNOSIS — K7460 Unspecified cirrhosis of liver: Secondary | ICD-10-CM | POA: Diagnosis not present

## 2024-07-05 DIAGNOSIS — E039 Hypothyroidism, unspecified: Secondary | ICD-10-CM | POA: Diagnosis not present

## 2024-07-05 DIAGNOSIS — E785 Hyperlipidemia, unspecified: Secondary | ICD-10-CM | POA: Diagnosis not present

## 2024-07-05 DIAGNOSIS — G8928 Other chronic postprocedural pain: Secondary | ICD-10-CM

## 2024-07-05 DIAGNOSIS — Z2821 Immunization not carried out because of patient refusal: Secondary | ICD-10-CM | POA: Diagnosis not present

## 2024-07-09 DIAGNOSIS — Z96642 Presence of left artificial hip joint: Secondary | ICD-10-CM | POA: Diagnosis not present

## 2024-07-09 DIAGNOSIS — T84011S Broken internal left hip prosthesis, sequela: Secondary | ICD-10-CM | POA: Diagnosis not present

## 2024-07-09 DIAGNOSIS — T84091A Other mechanical complication of internal left hip prosthesis, initial encounter: Secondary | ICD-10-CM | POA: Diagnosis not present

## 2024-07-09 DIAGNOSIS — Z96649 Presence of unspecified artificial hip joint: Secondary | ICD-10-CM | POA: Diagnosis not present

## 2024-07-12 ENCOUNTER — Ambulatory Visit
Admission: RE | Admit: 2024-07-12 | Discharge: 2024-07-12 | Disposition: A | Discharge status: Home / Self Care | Source: Ambulatory Visit | Attending: Sports Medicine | Admitting: Sports Medicine

## 2024-07-12 VITALS — BP 135/71 | HR 87 | Temp 98.6°F | Resp 15

## 2024-07-12 DIAGNOSIS — M8000XG Age-related osteoporosis with current pathological fracture, unspecified site, subsequent encounter for fracture with delayed healing: Secondary | ICD-10-CM | POA: Insufficient documentation

## 2024-07-12 MED ORDER — DENOSUMAB 60 MG/ML ~~LOC~~ SOSY
60.0000 mg | PREFILLED_SYRINGE | Freq: Once | SUBCUTANEOUS | Status: AC
  Administered 2024-07-12: 11:00:00 60 mg via SUBCUTANEOUS
  Filled 2024-07-12: qty 1

## 2024-07-27 ENCOUNTER — Other Ambulatory Visit: Payer: Self-pay | Admitting: Interventional Radiology

## 2024-07-27 ENCOUNTER — Telehealth: Payer: Self-pay

## 2024-07-27 DIAGNOSIS — M48061 Spinal stenosis, lumbar region without neurogenic claudication: Secondary | ICD-10-CM

## 2024-07-27 DIAGNOSIS — M47897 Other spondylosis, lumbosacral region: Secondary | ICD-10-CM

## 2024-07-27 DIAGNOSIS — S32010G Wedge compression fracture of first lumbar vertebra, subsequent encounter for fracture with delayed healing: Secondary | ICD-10-CM

## 2024-07-27 DIAGNOSIS — Z9889 Other specified postprocedural states: Secondary | ICD-10-CM

## 2024-07-27 NOTE — Telephone Encounter (Signed)
 Patient for IR bilat L5/S1 Facet inj on Friday 07/30/24, I called and spoke with the patient on the phone and gave pre-procedure instructions. Pt was made aware to be here at 10:30a and check in at the [facility] entrance. Pt stated understanding. Called 07/27/24

## 2024-07-30 ENCOUNTER — Other Ambulatory Visit: Payer: Self-pay | Admitting: Interventional Radiology

## 2024-07-30 ENCOUNTER — Ambulatory Visit
Admission: RE | Admit: 2024-07-30 | Discharge: 2024-07-30 | Disposition: A | Discharge status: Home / Self Care | Source: Ambulatory Visit | Attending: Interventional Radiology | Admitting: Interventional Radiology

## 2024-07-30 DIAGNOSIS — M48061 Spinal stenosis, lumbar region without neurogenic claudication: Secondary | ICD-10-CM

## 2024-07-30 DIAGNOSIS — X58XXXA Exposure to other specified factors, initial encounter: Secondary | ICD-10-CM | POA: Diagnosis not present

## 2024-07-30 DIAGNOSIS — M47897 Other spondylosis, lumbosacral region: Secondary | ICD-10-CM | POA: Diagnosis not present

## 2024-07-30 DIAGNOSIS — S32010G Wedge compression fracture of first lumbar vertebra, subsequent encounter for fracture with delayed healing: Secondary | ICD-10-CM

## 2024-07-30 DIAGNOSIS — Z9889 Other specified postprocedural states: Secondary | ICD-10-CM | POA: Diagnosis not present

## 2024-07-30 HISTORY — PX: IR FACET JT INJ L /S SINGLE LEVEL RIGHT W/FL/CT: IMG2360

## 2024-07-30 HISTORY — PX: IR FACET JT INJ L /S  2ND LEVEL RIGHT W/FL/CT: IMG2361

## 2024-07-30 MED ORDER — BUPIVACAINE HCL (PF) 0.5 % IJ SOLN
1.5000 mL | Freq: Once | INTRAMUSCULAR | Status: AC
  Administered 2024-07-30: 1.5 mL

## 2024-07-30 MED ORDER — LIDOCAINE HCL 1 % IJ SOLN
INTRAMUSCULAR | Status: AC
  Filled 2024-07-30: qty 20

## 2024-07-30 MED ORDER — LIDOCAINE HCL 1 % IJ SOLN
10.0000 mL | Freq: Once | INTRAMUSCULAR | Status: AC
  Administered 2024-07-30: 10 mL via INTRADERMAL

## 2024-07-30 MED ORDER — METHYLPREDNISOLONE ACETATE 80 MG/ML IJ SUSP
120.0000 mg | Freq: Once | INTRAMUSCULAR | Status: AC
  Administered 2024-07-30: 120 mg via INTRA_ARTICULAR

## 2024-07-30 MED ORDER — BUPIVACAINE HCL (PF) 0.5 % IJ SOLN
INTRAMUSCULAR | Status: AC
  Filled 2024-07-30: qty 30

## 2024-07-30 MED ORDER — METHYLPREDNISOLONE ACETATE 80 MG/ML IJ SUSP
INTRAMUSCULAR | Status: AC
  Filled 2024-07-30: qty 2
